# Patient Record
Sex: Male | Born: 1935 | Race: Black or African American | Hispanic: No | Marital: Married | Smoking: Former smoker
Health system: Southern US, Community
[De-identification: ages and names within clinical notes are randomized; demographics above are authoritative.]

## PROBLEM LIST (undated history)

## (undated) DIAGNOSIS — E119 Type 2 diabetes mellitus without complications: Secondary | ICD-10-CM

## (undated) DIAGNOSIS — S2249XA Multiple fractures of ribs, unspecified side, initial encounter for closed fracture: Secondary | ICD-10-CM

## (undated) DIAGNOSIS — E785 Hyperlipidemia, unspecified: Secondary | ICD-10-CM

## (undated) DIAGNOSIS — R0600 Dyspnea, unspecified: Secondary | ICD-10-CM

## (undated) DIAGNOSIS — Z794 Long term (current) use of insulin: Secondary | ICD-10-CM

## (undated) DIAGNOSIS — D649 Anemia, unspecified: Secondary | ICD-10-CM

## (undated) DIAGNOSIS — G8929 Other chronic pain: Secondary | ICD-10-CM

## (undated) DIAGNOSIS — I2510 Atherosclerotic heart disease of native coronary artery without angina pectoris: Secondary | ICD-10-CM

## (undated) DIAGNOSIS — T7840XA Allergy, unspecified, initial encounter: Secondary | ICD-10-CM

## (undated) DIAGNOSIS — Z9289 Personal history of other medical treatment: Secondary | ICD-10-CM

## (undated) DIAGNOSIS — N529 Male erectile dysfunction, unspecified: Secondary | ICD-10-CM

## (undated) DIAGNOSIS — I10 Essential (primary) hypertension: Secondary | ICD-10-CM

## (undated) DIAGNOSIS — M549 Dorsalgia, unspecified: Secondary | ICD-10-CM

## (undated) DIAGNOSIS — I472 Ventricular tachycardia, unspecified: Secondary | ICD-10-CM

## (undated) DIAGNOSIS — C61 Malignant neoplasm of prostate: Secondary | ICD-10-CM

## (undated) DIAGNOSIS — N189 Chronic kidney disease, unspecified: Secondary | ICD-10-CM

## (undated) HISTORY — PX: COLECTOMY: SHX59

## (undated) HISTORY — DX: Type 2 diabetes mellitus without complications: Z79.4

## (undated) HISTORY — DX: Other chronic pain: G89.29

## (undated) HISTORY — DX: Atherosclerotic heart disease of native coronary artery without angina pectoris: I25.10

## (undated) HISTORY — DX: Malignant neoplasm of prostate: C61

## (undated) HISTORY — DX: Ventricular tachycardia: I47.2

## (undated) HISTORY — DX: Type 2 diabetes mellitus without complications: E11.9

## (undated) HISTORY — DX: Hyperlipidemia, unspecified: E78.5

## (undated) HISTORY — DX: Dorsalgia, unspecified: M54.9

## (undated) HISTORY — DX: Ventricular tachycardia, unspecified: I47.20

## (undated) HISTORY — DX: Essential (primary) hypertension: I10

## (undated) HISTORY — DX: Male erectile dysfunction, unspecified: N52.9

## (undated) HISTORY — DX: Anemia, unspecified: D64.9

## (undated) HISTORY — DX: Allergy, unspecified, initial encounter: T78.40XA

---

## 2007-03-11 ENCOUNTER — Ambulatory Visit (HOSPITAL_COMMUNITY): Admission: RE | Admit: 2007-03-11 | Discharge: 2007-03-11 | Payer: Self-pay | Admitting: Urology

## 2007-04-08 ENCOUNTER — Ambulatory Visit (HOSPITAL_COMMUNITY): Admission: RE | Admit: 2007-04-08 | Discharge: 2007-04-08 | Payer: Self-pay | Admitting: Urology

## 2007-06-25 ENCOUNTER — Ambulatory Visit: Admission: RE | Admit: 2007-06-25 | Discharge: 2007-09-23 | Payer: Self-pay | Admitting: Radiation Oncology

## 2007-09-29 ENCOUNTER — Ambulatory Visit: Admission: RE | Admit: 2007-09-29 | Discharge: 2007-12-28 | Payer: Self-pay | Admitting: Radiation Oncology

## 2008-01-07 HISTORY — PX: PTCA: SHX146

## 2008-01-18 ENCOUNTER — Inpatient Hospital Stay (HOSPITAL_COMMUNITY): Admission: EM | Admit: 2008-01-18 | Discharge: 2008-01-22 | Payer: Self-pay | Admitting: Emergency Medicine

## 2008-01-18 ENCOUNTER — Ambulatory Visit: Payer: Self-pay | Admitting: Internal Medicine

## 2008-01-19 HISTORY — PX: CORONARY ANGIOPLASTY WITH STENT PLACEMENT: SHX49

## 2008-01-20 ENCOUNTER — Encounter (INDEPENDENT_AMBULATORY_CARE_PROVIDER_SITE_OTHER): Payer: Self-pay | Admitting: Cardiology

## 2008-01-22 ENCOUNTER — Ambulatory Visit: Payer: Self-pay | Admitting: Vascular Surgery

## 2008-01-22 ENCOUNTER — Encounter (INDEPENDENT_AMBULATORY_CARE_PROVIDER_SITE_OTHER): Payer: Self-pay | Admitting: *Deleted

## 2008-02-11 ENCOUNTER — Encounter (HOSPITAL_COMMUNITY): Admission: RE | Admit: 2008-02-11 | Discharge: 2008-04-06 | Payer: Self-pay | Admitting: Cardiology

## 2008-02-25 ENCOUNTER — Ambulatory Visit: Payer: Self-pay | Admitting: Internal Medicine

## 2008-02-25 LAB — CONVERTED CEMR LAB
Basophils Absolute: 0 10*3/uL (ref 0.0–0.1)
Basophils Relative: 0.1 % (ref 0.0–3.0)
Eosinophils Absolute: 0.1 10*3/uL (ref 0.0–0.7)
Eosinophils Relative: 1.3 % (ref 0.0–5.0)
Ferritin: 179 ng/mL (ref 22.0–322.0)
HCT: 33.9 % — ABNORMAL LOW (ref 39.0–52.0)
Hemoglobin: 11.5 g/dL — ABNORMAL LOW (ref 13.0–17.0)
Lymphocytes Relative: 9.8 % — ABNORMAL LOW (ref 12.0–46.0)
MCHC: 33.8 g/dL (ref 30.0–36.0)
MCV: 88.4 fL (ref 78.0–100.0)
Monocytes Absolute: 0.6 10*3/uL (ref 0.1–1.0)
Monocytes Relative: 8.7 % (ref 3.0–12.0)
Neutro Abs: 5.3 10*3/uL (ref 1.4–7.7)
Neutrophils Relative %: 80.1 % — ABNORMAL HIGH (ref 43.0–77.0)
Platelets: 202 10*3/uL (ref 150–400)
RBC: 3.83 M/uL — ABNORMAL LOW (ref 4.22–5.81)
RDW: 14.5 % (ref 11.5–14.6)
WBC: 6.7 10*3/uL (ref 4.5–10.5)

## 2008-03-27 ENCOUNTER — Inpatient Hospital Stay (HOSPITAL_COMMUNITY): Admission: EM | Admit: 2008-03-27 | Discharge: 2008-03-30 | Payer: Self-pay | Admitting: Emergency Medicine

## 2008-03-27 ENCOUNTER — Ambulatory Visit: Payer: Self-pay | Admitting: Internal Medicine

## 2008-04-08 ENCOUNTER — Encounter (HOSPITAL_COMMUNITY): Admission: RE | Admit: 2008-04-08 | Discharge: 2008-06-21 | Payer: Self-pay | Admitting: Cardiology

## 2008-04-28 ENCOUNTER — Ambulatory Visit: Payer: Self-pay | Admitting: Internal Medicine

## 2008-05-08 DIAGNOSIS — Z8679 Personal history of other diseases of the circulatory system: Secondary | ICD-10-CM | POA: Insufficient documentation

## 2008-05-08 DIAGNOSIS — I2510 Atherosclerotic heart disease of native coronary artery without angina pectoris: Secondary | ICD-10-CM

## 2008-05-08 DIAGNOSIS — I4949 Other premature depolarization: Secondary | ICD-10-CM

## 2008-05-08 DIAGNOSIS — I472 Ventricular tachycardia: Secondary | ICD-10-CM

## 2008-05-08 HISTORY — DX: Atherosclerotic heart disease of native coronary artery without angina pectoris: I25.10

## 2008-05-08 HISTORY — DX: Other premature depolarization: I49.49

## 2008-05-09 ENCOUNTER — Ambulatory Visit: Payer: Self-pay | Admitting: Internal Medicine

## 2008-05-09 LAB — CONVERTED CEMR LAB
BUN: 16 mg/dL (ref 6–23)
Basophils Absolute: 0 10*3/uL (ref 0.0–0.1)
Basophils Relative: 0.3 % (ref 0.0–3.0)
CO2: 22 meq/L (ref 19–32)
Calcium: 9.6 mg/dL (ref 8.4–10.5)
Chloride: 108 meq/L (ref 96–112)
Creatinine, Ser: 1.1 mg/dL (ref 0.4–1.5)
Eosinophils Absolute: 0.1 10*3/uL (ref 0.0–0.7)
Eosinophils Relative: 1.1 % (ref 0.0–5.0)
GFR calc Af Amer: 85 mL/min
GFR calc non Af Amer: 70 mL/min
Glucose, Bld: 144 mg/dL — ABNORMAL HIGH (ref 70–99)
HCT: 36 % — ABNORMAL LOW (ref 39.0–52.0)
Hemoglobin: 12.3 g/dL — ABNORMAL LOW (ref 13.0–17.0)
INR: 0.9 (ref 0.8–1.0)
Lymphocytes Relative: 19.3 % (ref 12.0–46.0)
MCHC: 34.2 g/dL (ref 30.0–36.0)
MCV: 84.8 fL (ref 78.0–100.0)
Monocytes Absolute: 0.6 10*3/uL (ref 0.1–1.0)
Monocytes Relative: 10.1 % (ref 3.0–12.0)
Neutro Abs: 3.8 10*3/uL (ref 1.4–7.7)
Neutrophils Relative %: 69.2 % (ref 43.0–77.0)
Platelets: 156 10*3/uL (ref 150–400)
Potassium: 4.9 meq/L (ref 3.5–5.1)
Prothrombin Time: 10.1 s — ABNORMAL LOW (ref 10.9–13.3)
RBC: 4.25 M/uL (ref 4.22–5.81)
RDW: 14.8 % — ABNORMAL HIGH (ref 11.5–14.6)
Sodium: 138 meq/L (ref 135–145)
WBC: 5.6 10*3/uL (ref 4.5–10.5)
aPTT: 27.5 s (ref 21.7–29.8)

## 2008-05-13 ENCOUNTER — Ambulatory Visit: Payer: Self-pay | Admitting: Internal Medicine

## 2008-05-13 ENCOUNTER — Ambulatory Visit (HOSPITAL_COMMUNITY): Admission: RE | Admit: 2008-05-13 | Discharge: 2008-05-13 | Payer: Self-pay | Admitting: Internal Medicine

## 2008-05-30 ENCOUNTER — Ambulatory Visit: Payer: Self-pay | Admitting: Internal Medicine

## 2008-05-30 ENCOUNTER — Inpatient Hospital Stay (HOSPITAL_COMMUNITY): Admission: EM | Admit: 2008-05-30 | Discharge: 2008-06-03 | Payer: Self-pay | Admitting: Emergency Medicine

## 2008-05-31 HISTORY — PX: CARDIAC CATHETERIZATION: SHX172

## 2008-06-27 ENCOUNTER — Encounter: Payer: Self-pay | Admitting: Internal Medicine

## 2008-06-27 ENCOUNTER — Ambulatory Visit: Payer: Self-pay | Admitting: Internal Medicine

## 2008-08-04 ENCOUNTER — Encounter (HOSPITAL_COMMUNITY): Admission: RE | Admit: 2008-08-04 | Discharge: 2008-11-02 | Payer: Self-pay | Admitting: Cardiology

## 2008-11-03 ENCOUNTER — Encounter (HOSPITAL_COMMUNITY): Admission: RE | Admit: 2008-11-03 | Discharge: 2008-12-15 | Payer: Self-pay | Admitting: Cardiology

## 2009-04-08 LAB — HM COLONOSCOPY

## 2009-07-13 ENCOUNTER — Inpatient Hospital Stay (HOSPITAL_COMMUNITY): Admission: EM | Admit: 2009-07-13 | Discharge: 2009-07-14 | Payer: Self-pay | Admitting: Emergency Medicine

## 2010-03-21 ENCOUNTER — Emergency Department (HOSPITAL_COMMUNITY)
Admission: EM | Admit: 2010-03-21 | Discharge: 2010-03-21 | Payer: Self-pay | Source: Home / Self Care | Admitting: Emergency Medicine

## 2010-06-27 LAB — COMPREHENSIVE METABOLIC PANEL
ALT: 22 U/L (ref 0–53)
AST: 22 U/L (ref 0–37)
Albumin: 3.4 g/dL — ABNORMAL LOW (ref 3.5–5.2)
Alkaline Phosphatase: 41 U/L (ref 39–117)
BUN: 12 mg/dL (ref 6–23)
CO2: 26 mEq/L (ref 19–32)
Calcium: 9.2 mg/dL (ref 8.4–10.5)
Chloride: 105 mEq/L (ref 96–112)
Creatinine, Ser: 0.92 mg/dL (ref 0.4–1.5)
GFR calc Af Amer: >60 mL/min (ref >60)
GFR calc non Af Amer: >60 mL/min (ref >60)
Glucose, Bld: 107 mg/dL — ABNORMAL HIGH (ref 70–99)
Potassium: 4.3 mEq/L (ref 3.5–5.1)
Sodium: 139 mEq/L (ref 135–145)
Total Bilirubin: 0.5 mg/dL (ref 0.3–1.2)
Total Protein: 6.3 g/dL (ref 6.0–8.3)

## 2010-06-27 LAB — TROPONIN I: Troponin I: 0.01 ng/mL (ref 0.00–0.06)

## 2010-06-27 LAB — DIFFERENTIAL
Basophils Absolute: 0 10*3/uL (ref 0.0–0.1)
Basophils Relative: 0 % (ref 0–1)
Eosinophils Absolute: 0.3 10*3/uL (ref 0.0–0.7)
Eosinophils Relative: 5 % (ref 0–5)
Lymphocytes Relative: 24 % (ref 12–46)
Lymphs Abs: 1.2 10*3/uL (ref 0.7–4.0)
Monocytes Absolute: 0.6 10*3/uL (ref 0.1–1.0)
Monocytes Relative: 13 % — ABNORMAL HIGH (ref 3–12)
Neutro Abs: 2.9 10*3/uL (ref 1.7–7.7)
Neutrophils Relative %: 58 % (ref 43–77)

## 2010-06-27 LAB — MAGNESIUM: Magnesium: 1.7 mg/dL (ref 1.5–2.5)

## 2010-06-27 LAB — GLUCOSE, CAPILLARY
Glucose-Capillary: 112 mg/dL — ABNORMAL HIGH (ref 70–99)
Glucose-Capillary: 123 mg/dL — ABNORMAL HIGH (ref 70–99)
Glucose-Capillary: 138 mg/dL — ABNORMAL HIGH (ref 70–99)

## 2010-06-27 LAB — CBC
HCT: 34.9 % — ABNORMAL LOW (ref 39.0–52.0)
Hemoglobin: 11.3 g/dL — ABNORMAL LOW (ref 13.0–17.0)
MCHC: 32.4 g/dL (ref 30.0–36.0)
MCV: 84.5 fL (ref 78.0–100.0)
Platelets: 177 10*3/uL (ref 150–400)
RBC: 4.13 MIL/uL — ABNORMAL LOW (ref 4.22–5.81)
RDW: 15.5 % (ref 11.5–15.5)
WBC: 5.1 10*3/uL (ref 4.0–10.5)

## 2010-06-27 LAB — LIPID PANEL
Cholesterol: 128 mg/dL (ref 0–200)
HDL: 48 mg/dL (ref >39)
LDL Cholesterol: 60 mg/dL (ref 0–99)
Total CHOL/HDL Ratio: 2.7 RATIO
Triglycerides: 102 mg/dL (ref <150)
VLDL: 20 mg/dL (ref 0–40)

## 2010-06-27 LAB — CARDIAC PANEL(CRET KIN+CKTOT+MB+TROPI)
CK, MB: 1.2 ng/mL (ref 0.3–4.0)
CK, MB: 2.4 ng/mL (ref 0.3–4.0)
Relative Index: 1 (ref 0.0–2.5)
Relative Index: INVALID (ref 0.0–2.5)
Total CK: 233 U/L — ABNORMAL HIGH (ref 7–232)
Total CK: 68 U/L (ref 7–232)
Troponin I: 0.01 ng/mL (ref 0.00–0.06)
Troponin I: <0.01 ng/mL (ref 0.00–0.06)

## 2010-06-27 LAB — CK TOTAL AND CKMB (NOT AT ARMC)
CK, MB: 1.2 ng/mL (ref 0.3–4.0)
Relative Index: INVALID (ref 0.0–2.5)
Total CK: 73 U/L (ref 7–232)

## 2010-06-27 LAB — TSH: TSH: 1.66 u[IU]/mL (ref 0.350–4.500)

## 2010-06-27 LAB — APTT: aPTT: 30 seconds (ref 24–37)

## 2010-06-27 LAB — PROTIME-INR
INR: 0.94 (ref 0.00–1.49)
Prothrombin Time: 12.5 seconds (ref 11.6–15.2)

## 2010-07-15 LAB — GLUCOSE, CAPILLARY: Glucose-Capillary: 164 mg/dL — ABNORMAL HIGH (ref 70–99)

## 2010-07-16 LAB — GLUCOSE, CAPILLARY
Glucose-Capillary: 174 mg/dL — ABNORMAL HIGH (ref 70–99)
Glucose-Capillary: 188 mg/dL — ABNORMAL HIGH (ref 70–99)
Glucose-Capillary: 156 mg/dL — ABNORMAL HIGH (ref 70–99)
Glucose-Capillary: 167 mg/dL — ABNORMAL HIGH (ref 70–99)
Glucose-Capillary: 227 mg/dL — ABNORMAL HIGH (ref 70–99)

## 2010-07-17 LAB — GLUCOSE, CAPILLARY
Glucose-Capillary: 213 mg/dL — ABNORMAL HIGH (ref 70–99)
Glucose-Capillary: 148 mg/dL — ABNORMAL HIGH (ref 70–99)
Glucose-Capillary: 153 mg/dL — ABNORMAL HIGH (ref 70–99)
Glucose-Capillary: 170 mg/dL — ABNORMAL HIGH (ref 70–99)
Glucose-Capillary: 172 mg/dL — ABNORMAL HIGH (ref 70–99)
Glucose-Capillary: 185 mg/dL — ABNORMAL HIGH (ref 70–99)
Glucose-Capillary: 201 mg/dL — ABNORMAL HIGH (ref 70–99)

## 2010-07-24 LAB — CARDIAC PANEL(CRET KIN+CKTOT+MB+TROPI)
CK, MB: 1.1 ng/mL (ref 0.3–4.0)
Relative Index: INVALID (ref 0.0–2.5)
Total CK: 73 U/L (ref 7–232)
Troponin I: <0.01 ng/mL (ref 0.00–0.06)

## 2010-07-24 LAB — DIFFERENTIAL
Basophils Absolute: 0 10*3/uL (ref 0.0–0.1)
Basophils Relative: 0 % (ref 0–1)
Eosinophils Absolute: 0.1 10*3/uL (ref 0.0–0.7)
Eosinophils Relative: 1 % (ref 0–5)
Lymphocytes Relative: 17 % (ref 12–46)
Lymphs Abs: 1 10*3/uL (ref 0.7–4.0)
Monocytes Absolute: 0.5 10*3/uL (ref 0.1–1.0)
Monocytes Relative: 9 % (ref 3–12)
Neutro Abs: 4.2 10*3/uL (ref 1.7–7.7)
Neutrophils Relative %: 72 % (ref 43–77)

## 2010-07-24 LAB — GLUCOSE, CAPILLARY
Glucose-Capillary: 122 mg/dL — ABNORMAL HIGH (ref 70–99)
Glucose-Capillary: 97 mg/dL (ref 70–99)
Glucose-Capillary: 109 mg/dL — ABNORMAL HIGH (ref 70–99)
Glucose-Capillary: 115 mg/dL — ABNORMAL HIGH (ref 70–99)
Glucose-Capillary: 116 mg/dL — ABNORMAL HIGH (ref 70–99)
Glucose-Capillary: 121 mg/dL — ABNORMAL HIGH (ref 70–99)
Glucose-Capillary: 124 mg/dL — ABNORMAL HIGH (ref 70–99)
Glucose-Capillary: 136 mg/dL — ABNORMAL HIGH (ref 70–99)
Glucose-Capillary: 139 mg/dL — ABNORMAL HIGH (ref 70–99)
Glucose-Capillary: 141 mg/dL — ABNORMAL HIGH (ref 70–99)
Glucose-Capillary: 144 mg/dL — ABNORMAL HIGH (ref 70–99)
Glucose-Capillary: 156 mg/dL — ABNORMAL HIGH (ref 70–99)
Glucose-Capillary: 160 mg/dL — ABNORMAL HIGH (ref 70–99)
Glucose-Capillary: 166 mg/dL — ABNORMAL HIGH (ref 70–99)
Glucose-Capillary: 167 mg/dL — ABNORMAL HIGH (ref 70–99)
Glucose-Capillary: 182 mg/dL — ABNORMAL HIGH (ref 70–99)
Glucose-Capillary: 183 mg/dL — ABNORMAL HIGH (ref 70–99)
Glucose-Capillary: 199 mg/dL — ABNORMAL HIGH (ref 70–99)
Glucose-Capillary: 83 mg/dL (ref 70–99)
Glucose-Capillary: 92 mg/dL (ref 70–99)

## 2010-07-24 LAB — BASIC METABOLIC PANEL
BUN: 12 mg/dL (ref 6–23)
BUN: 14 mg/dL (ref 6–23)
CO2: 20 mEq/L (ref 19–32)
CO2: 23 mEq/L (ref 19–32)
Calcium: 9.1 mg/dL (ref 8.4–10.5)
Calcium: 9.2 mg/dL (ref 8.4–10.5)
Chloride: 110 mEq/L (ref 96–112)
Chloride: 110 mEq/L (ref 96–112)
Creatinine, Ser: 0.98 mg/dL (ref 0.4–1.5)
Creatinine, Ser: 1.02 mg/dL (ref 0.4–1.5)
GFR calc Af Amer: >60 mL/min (ref >60)
GFR calc Af Amer: >60 mL/min (ref >60)
GFR calc non Af Amer: >60 mL/min (ref >60)
GFR calc non Af Amer: >60 mL/min (ref >60)
Glucose, Bld: 118 mg/dL — ABNORMAL HIGH (ref 70–99)
Glucose, Bld: 135 mg/dL — ABNORMAL HIGH (ref 70–99)
Potassium: 4.4 mEq/L (ref 3.5–5.1)
Potassium: 4.4 mEq/L (ref 3.5–5.1)
Sodium: 137 mEq/L (ref 135–145)
Sodium: 138 mEq/L (ref 135–145)

## 2010-07-24 LAB — COMPREHENSIVE METABOLIC PANEL
ALT: 13 U/L (ref 0–53)
AST: 22 U/L (ref 0–37)
Albumin: 3.3 g/dL — ABNORMAL LOW (ref 3.5–5.2)
Alkaline Phosphatase: 32 U/L — ABNORMAL LOW (ref 39–117)
BUN: 13 mg/dL (ref 6–23)
CO2: 17 mEq/L — ABNORMAL LOW (ref 19–32)
Calcium: 9.5 mg/dL (ref 8.4–10.5)
Chloride: 112 mEq/L (ref 96–112)
Creatinine, Ser: 1.04 mg/dL (ref 0.4–1.5)
GFR calc Af Amer: >60 mL/min (ref >60)
GFR calc non Af Amer: >60 mL/min (ref >60)
Glucose, Bld: 110 mg/dL — ABNORMAL HIGH (ref 70–99)
Potassium: 4.6 mEq/L (ref 3.5–5.1)
Sodium: 138 mEq/L (ref 135–145)
Total Bilirubin: 0.6 mg/dL (ref 0.3–1.2)
Total Protein: 6.3 g/dL (ref 6.0–8.3)

## 2010-07-24 LAB — CBC
HCT: 34.6 % — ABNORMAL LOW (ref 39.0–52.0)
HCT: 35.6 % — ABNORMAL LOW (ref 39.0–52.0)
HCT: 36.7 % — ABNORMAL LOW (ref 39.0–52.0)
Hemoglobin: 11.6 g/dL — ABNORMAL LOW (ref 13.0–17.0)
Hemoglobin: 12.1 g/dL — ABNORMAL LOW (ref 13.0–17.0)
Hemoglobin: 12.3 g/dL — ABNORMAL LOW (ref 13.0–17.0)
MCHC: 33.4 g/dL (ref 30.0–36.0)
MCHC: 33.5 g/dL (ref 30.0–36.0)
MCHC: 34 g/dL (ref 30.0–36.0)
MCV: 84.8 fL (ref 78.0–100.0)
MCV: 85.7 fL (ref 78.0–100.0)
MCV: 86.2 fL (ref 78.0–100.0)
Platelets: 164 10*3/uL (ref 150–400)
Platelets: 166 10*3/uL (ref 150–400)
Platelets: 170 10*3/uL (ref 150–400)
RBC: 4.02 MIL/uL — ABNORMAL LOW (ref 4.22–5.81)
RBC: 4.2 MIL/uL — ABNORMAL LOW (ref 4.22–5.81)
RBC: 4.28 MIL/uL (ref 4.22–5.81)
RDW: 15.8 % — ABNORMAL HIGH (ref 11.5–15.5)
RDW: 15.9 % — ABNORMAL HIGH (ref 11.5–15.5)
RDW: 16.4 % — ABNORMAL HIGH (ref 11.5–15.5)
WBC: 5.1 10*3/uL (ref 4.0–10.5)
WBC: 5.6 10*3/uL (ref 4.0–10.5)
WBC: 5.8 10*3/uL (ref 4.0–10.5)

## 2010-07-24 LAB — HEMOGLOBIN A1C
Hgb A1c MFr Bld: 7.1 % — ABNORMAL HIGH (ref 4.6–6.1)
Mean Plasma Glucose: 157 mg/dL

## 2010-07-24 LAB — TROPONIN I: Troponin I: <0.01 ng/mL (ref 0.00–0.06)

## 2010-07-24 LAB — MAGNESIUM: Magnesium: 2 mg/dL (ref 1.5–2.5)

## 2010-07-24 LAB — BRAIN NATRIURETIC PEPTIDE: Pro B Natriuretic peptide (BNP): 188 pg/mL — ABNORMAL HIGH (ref 0.0–100.0)

## 2010-07-24 LAB — CK TOTAL AND CKMB (NOT AT ARMC)
CK, MB: 1.4 ng/mL (ref 0.3–4.0)
Relative Index: INVALID (ref 0.0–2.5)
Total CK: 84 U/L (ref 7–232)

## 2010-07-24 LAB — APTT: aPTT: 31 seconds (ref 24–37)

## 2010-07-24 LAB — PROTIME-INR
INR: 1 (ref 0.00–1.49)
Prothrombin Time: 13.1 seconds (ref 11.6–15.2)

## 2010-08-21 NOTE — Discharge Summary (Signed)
[name], [name]               ACCOUNT NO.:  [id number]   MEDICAL RECORD NO.:  [id number]          PATIENT TYPE:  INP   LOCATION:  [id number]                         FACILITY:  [facility]   PHYSICIAN:  Tery Sanfilippo, MD     DATE OF BIRTH:  [birth year]   DATE OF ADMISSION:  05/30/2008  DATE OF DISCHARGE:  06/03/2008                               DISCHARGE SUMMARY   DISCHARGE DIAGNOSES:  1. Nonsustained ventricular tachycardia.  2. Status post comprehensive electrophysiology study with mapping and      ablation of his ventricular tachycardia.  3. History of normal left ventricular function.  4. Coronary artery disease, status post left cardiac catheterization      on May 31, 2008.  He had stable disease and he had a patent      left anterior descending stent.  Performed by Dr. Adrian Prows.  He      had minimal disease in right coronary artery and a circumflex.  5. Non-insulin-dependent diabetes mellitus.  6. Hypertension.  7. History of benign prostatic hypertrophy.  8. Prior history of some type of abdominal surgery when living in [place].   LABORATORY DATA:  His CK-MBs and troponins were all negative.  His  sodium was 138, potassium 4.4, glucose 135, BUN 14, and creatinine 1.02.  Hemoglobin A1c was 7.1, hemoglobin 11.6, hematocrit 34.6, WBCs 5.6, and  platelets 164.  Magnesium was 2.0 and BNP was 188.  Chest x-ray on  May 30, 2008, shows no acute cardiopulmonary abnormality.   HOSPITAL COURSE:  [name] is a 76 year old who came to the  emergency room by the request of Dr. Elisabeth Cara because of frequent NSVT on  his Julian monitoring that he had been wearing over the weekend.  He  has a prior medical history of coronary artery disease with a history of  a stent to his LAD vessel.  His last cath had been performed in October  2009, at which time he got a permanent stent in his LAD.  He also has  had a prior EP study on May 13, 2008.  However, he had low PVC  burden and they were unable to induce any VT.  He had previously been on  amiodarone and this had been stopped.  He was admitted for further  evaluation.  It was decided he should undergo cardiac catheterization on  May 31, 2008, which showed his LAD stent was patent.  He had  nonobstructive other coronary disease in his circumflex and LAD.  We  called an EP consult.  He was seen by Dr. Rayann Heman.  It was decided that  he should undergo repeat EP procedure.  This was performed on June 02, 2008, by Dr. Rayann Heman.  It was a comprehensive study.  He had mapping  and ablation of his VT.  Postprocedure, he did well.  He had no further  VT and it was decided that he should go home and wear his CardioNet to  see if the VT ablation was continued to be successful.  He will follow  up with  Dr. Elisabeth Cara and Dr. Rayann Heman as an outpatient.  He was given  appointments for both.   DISCHARGE MEDICATIONS:  The same medications he was on except he should  stop his verapamil.  Medicines include glipizide 5 mg 1/2 everyday,  lisinopril 40 mg a day, metformin 1000 mg a day, famotidine 20 mg a day,  metoprolol he was reduced back down to 50 mg a day which is both his  home dose that had been increased as an inpatient; however, he was not  able to take his nightly dose because of his heart rate and blood  pressure parameters,  megestrol 20 mg daily, Plavix 75 mg daily, Lipitor 20 mg a day, aspirin  325 mg everyday, Centrum Silver everyday, fish oil everyday, alprazolam  0.5 as needed p.r.n., calcium 2 daily, Niaspan 500 mg at bedtime, and  vitamin D 3000 units a day.      Cyndia Bent, N.P.      Tery Sanfilippo, MD  Electronically Signed    BB/MEDQ  D:  06/03/2008  T:  06/04/2008  Job:  [id number]   cc:   Thompson Grayer, MD  [name], M.D.

## 2010-08-21 NOTE — H&P (Signed)
NAMECOLTIN, [name]               ACCOUNT NO.:  [id number]   MEDICAL RECORD NO.:  [id number]          PATIENT TYPE:  INP   LOCATION:  4729                         FACILITY:  [facility]   PHYSICIAN:  Eden Lathe. Einar Gip, MD       DATE OF BIRTH:  06-02-1933   DATE OF ADMISSION:  03/27/2008  DATE OF DISCHARGE:  03/30/2008                              HISTORY & PHYSICAL   DISCHARGE DIAGNOSES:  1. Chest pain.  Negative CK-MBs and troponins doubt it to be unstable      angina.  2. Atherosclerotic cardiovascular disease with recent myocardial      infarction October 2009 with a PROMUS stent to his left anterior      descending.  3. Nonsustained ventricular tachycardia/[name] time      septal premature ventricular contractions seen by Dr. Rayann Heman as an      outpatient had previously been on amiodarone post myocardial      infarction.  This had been discontinued prior to this      hospitalization.  During this hospitalization, he had 26 beats of      nonsustained ventricular tachycardia several times with frequent      premature ventricular contractions.  His metoprolol was initially      increased.  He was then seen by Dr. Caryl Comes who also recommended      calcium channel blocker, thus his Norvasc was changed to verapamil.      He received one dose of that.  His premature ventricular      contractions that day and the next were diminished with no further      runs of nonsustained ventricular tachycardia.  His heart rate did      get down to the 50s thus his metoprolol was decreased back to 50 mg      b.i.d. at the time of discharge.  4. Non-insulin-dependent diabetes mellitus.  5. Hypertension.  6. Dyslipidemia.  7. History of pancreatitis.  8. History of mild anemia.  This was post procedure in October.  He      was on iron for 2 months, this has now been discontinued.  9. Prostate cancer with history of seed implantation now on injections      by Urology.  10.Normal left ventricular  function.   LABORATORY DATA:  Hemoglobin 12.3, hematocrit 36.8, WBC 5.0, and  platelets 189.  Magnesium was 2.1.  Sodium 137, potassium 4.9, glucose  192, BUN 8, creatinine 0.90.  Hemoglobin A1c was 6.6.  CK-MBs and  troponins were all negative.  TSH was 2.188.  Total cholesterol is 103,  triglycerides 103, HDL 25 and LDL 57.   DISCHARGE MEDICATIONS:  1. Plavix 75 mg one time per day.  2. Lisinopril 40 mg one time per day.  3. Metoprolol 50 mg twice a day.  4. Lipitor 20 mg once a day.  5. Glipizide 7.5 mg once a day.  6. Megestrol 20 mg daily.  7. Metformin 1000 mg b.i.d.  8. Pepcid 20 mg b.i.d.  9. Aspirin 325 mg everyday.  10.Centrum Silver one everyday.  11.Verapamil  SR 180 mg daily.  12.Fish oil one time a day.  13.Vitamin D 1000 mg a day.   He has a follow-up with Dr. Rayann Heman on May 03, 2007 at 9:45 and he  will return to see Dr. Elisabeth Cara in 2 weeks.   HOSPITAL COURSE:  [name] is a 77 year old African American male with  known coronary artery disease.  He had a PROMUS stent to his LAD October  2009 with an MI.  He came to the emergency room this admission with  complaints of chest pain after shoveling snow the day previously.  Chest  pain was on and off.  He went to Urgent Care and he was then transferred  to [facility].  He was seen by Dr. Quincy Acosta and thought to  be admitted.  He was placed on IV heparin.  The following day, he was  seen by Dr. Einar Gip.  His CK-MB and troponins were all negative.  It was  told that he had unstable angina with recent stenting with a DES stent  thus because of his frequent NSVT, it was decided to call EP Dr. Rayann Heman  for further evaluation.  Dr. Rayann Heman suggested increasing his metoprolol  to 100 mg b.i.d. and to follow up as an outpatient.  The patient  continued to have runs of NSVT thus he was kept in the hospital another  day.  He was seen by Dr. Caryl Comes in Dr. Jackalyn Lombard absence on January 28, 2008.  It was decided to  start him also on a calcium channel blocker  thus his Norvasc was discontinued and he was placed on verapamil.  After  the verapamil was given, he had his frequent PVCs diminished and he had  no further runs of NSVT.  He made an appointment to see Dr. Rayann Heman as an  outpatient prior to discharge by Dr. Caryl Comes.  He was kept in the hospital  overnight after beginning his verapamil.  His heart rate was noted to  drop down one time at 40 while he was sleeping but his baseline blood  pressure during the day dropped down to low 50s.  Thus the morning of  discharge, it was decided to decrease his metoprolol to 50 mg b.i.d.  from 100 mg b.i.d.  He was seen by Dr. Einar Gip prior to discharge and  considered stable for discharge home.      Cyndia Bent, N.P.      Eden Lathe. Einar Gip, MD  Electronically Signed    BB/MEDQ  D:  03/30/2008  T:  03/30/2008  Job:  [id number]   cc:   Thompson Grayer, MD

## 2010-08-21 NOTE — Op Note (Signed)
[name], [name]               ACCOUNT NO.:  [id number]   MEDICAL RECORD NO.:  [id number]          PATIENT TYPE:  OIB   LOCATION:  2899                         FACILITY:  [facility]   PHYSICIAN:  Thompson Grayer, MD       DATE OF BIRTH:  1933/10/02   DATE OF PROCEDURE:  DATE OF DISCHARGE:  05/13/2008                               OPERATIVE REPORT   PREPROCEDURE DIAGNOSES:  Nonsustained ventricular tachycardia and  frequent premature ventricular contractions.   POSTPROCEDURE DIAGNOSES:  Nonsustained ventricular tachycardia and  frequent premature ventricular contractions.   INTRODUCTION:  [name] is a pleasant 77 year old gentleman with a  history of coronary artery disease and symptomatic nonsustained  ventricular tachycardia.  He has had recent hospitalizations for  symptomatic premature ventricular contractions as well as nonsustained  ventricular tachycardia.  He notes that during cardiac rehab, his  tachycardia becomes incessant with symptoms of fatigue and shortness of  breath.  He has failed medical therapy with amiodarone, metoprolol and  verapamil.  He therefore presents today for EP study and radiofrequency  ablation.   DESCRIPTION OF THE PROCEDURE:  Informed written consent was obtained and  the patient was brought to the Electrophysiology Lab in a fasting state.  IV sedation was not performed as the patient was noted to have only rare  premature ventricular contractions upon presentation.  His PVC  morphology was of a left bundle-branch right inferior axis with a QRS  duration of 161 milliseconds.  He had two PVC couplets of this  morphology over approximately 10 minutes but no other PVCs were  spontaneously observed.  Isoproterenol was therefore infused at doses of  0.5, 1, 2, 3, and 4 mcg per minute with only very rare PVCs observed.  A  second PVC morphology was observed during 4 mcg per minute of  isoproterenol infusion which was of a left bundle-branch left  superior  axis with a QRS duration of 170 milliseconds and a QRS transition at V3,  which was felt to not represent his clinical PVC.  This PVC was also too  infrequent for adequate three-dimensional mapping.  Isoproterenol was  allowed to wash out and the patient was observed to have no PVCs during  isoproterenol washout.  The observation period during the EP procedure  was 2 hours.  Because of the very infrequent nature of the patient's  premature ventricular contractions today and inability to stimulate them  by voiding sedation and also by isoproterenol infusion, the patient's  groin was not accessed today and attempts at EP study and radiofrequency  ablation were not performed.  The patient tolerated the procedure well  and there were no early apparent complications.   CONCLUSIONS:  1. Sinus rhythm upon presentation.  2. Very rare premature ventricular contractions of a left bundle-      branch right inferior axis morphology and a QRS duration of 160      milliseconds with a QRS transition at V3, which was felt to      represent the patient's clinical tachycardia.  The PVC focus was      too infrequent  to be three-dimensionally mapped and therefore EP      study and radiofrequency ablation was not performed today.  3. No inducible clinical arrhythmias with isoproterenol infused at      0.5, 1, 2, 3 and 4 mcg per minute with adequate heart rate response      during isoproterenol infusion.  4. No early apparent complications.      Thompson Grayer, MD  Electronically Signed     JA/MEDQ  D:  05/13/2008  T:  05/13/2008  Job:  [id number]   cc:   Tery Sanfilippo, MD  [name], M.D.

## 2010-08-21 NOTE — H&P (Signed)
[name], [name]               ACCOUNT NO.:  [id number]   MEDICAL RECORD NO.:  [id number]          PATIENT TYPE:  INP   LOCATION:  [id number]                         FACILITY:  [facility]   PHYSICIAN:  Tery Sanfilippo, MD     DATE OF BIRTH:  [birth year]   DATE OF ADMISSION:  05/30/2008  DATE OF DISCHARGE:                              HISTORY & PHYSICAL   HPI:  Mr. Ugalde is a 77 year old male followed by Dr. Elisabeth Cara.  He has  a history of coronary disease.  He underwent LAD Promus stenting October  of 2009 in the setting of an MI.  Hospital course was complicated by  nonsustained V-tach.  He was put on amiodarone.  He did have normal LV  function.  He was seen in consult by Dr. Rayann Heman on January 21, 2008, for  nonsustained V-tach.  Patient was seen in followup also by Dr. Rayann Heman in  November and he was doing well and his amiodarone was discontinued.  He  was admitted again in December 2009 with some dyspnea and chest pressure  described as an elephant on my chest while shoveling snow.  His  enzymes were negative.  His calcium blocker was changed to verapamil and  his beta-blocker was titrated.  He had no further ectopy and he was  discharged.  He continues to have palpitations and was seen by Dr.  Rayann Heman again in January and set up for an EP study.  This was attempted  on May 13, 2008, but apparently there were only rare PVCs and no  inducible clinical arrhythmia.  Patient recently had a monitor as an  outpatient.  This reportedly showed runs of nonsustained V-tach.  He was  sent over to the emergency room to be admitted from the office by phone.  The patient does admit to me that he has exertional shortness of breath.  He says he does not have chest pain but he does admit to occasional  pressure.  Denies any associated nausea, vomiting, or diaphoresis.  He  has not had syncope.   PAST MEDICAL HISTORY:  Remarkable for:  1. Type 2 non-insulin-dependent diabetes.  2. He has treated  hypertension.  3. He has a history of BPH.  4. He has had some previous abdominal surgery when he was living in      [place] but he cannot remember the name of this.   CURRENT MEDICATIONS:  1. Lisinopril 40 mg a day.  2. Metoprolol 25 mg b.i.d.  3. Calan 360 mg a day.  4. Glyburide 7.5 mg a day.  5. Hydrochlorothiazide 25 mg a day.  6. Megace 20 mg a day.  7. Metformin 1 g b.i.d.   HE HAS NO KNOWN DRUG ALLERGIES.   SOCIAL HISTORY:  He is separated.  He has 7 children.  He quit smoking  more than 20 years ago.  He is retired.  He moved here from [facility] 7  years ago.   FAMILY HISTORY:  Unremarkable for early coronary disease.   REVIEW OF SYSTEMS:  Essentially unremarkable except for noted above.  He  does have palpitations,  fluttering, and dyspnea on exertion.   LABS:  Sodium 138, potassium 4.6, BUN 13, creatinine 1.04, white count  5.8, hemoglobin 12.3, hematocrit 36.7, platelets 170, INR 1.0, magnesium  2.0.  BNP 188.  CK and troponin are negative in the emergency room.  Chest x-ray shows no active disease.  EKG shows sinus rhythm with PVCs.   IMPRESSION:  1. Premature ventricular contractions and nonsustained ventricular      tachycardia, recurrent.  2. Exertional dyspnea, rule out progression of coronary disease.  3. Known coronary disease with unstable angina, October of 2009,      treated with left anterior descending Promus stenting.  The patient      has been on Plavix although this was not listed in the emergency      room as one of his medications.  4. Type 2 non-insulin-dependent diabetes.  5. Normal left ventricular function.  6. Treated hypertension.   PLAN:  Patient will be admitted to telemetry.  We will discuss with MD  but he will probably need restudy.      Erlene Quan, P.A.      Tery Sanfilippo, MD  Electronically Signed    LKK/MEDQ  D:  05/30/2008  T:  05/30/2008  Job:  [id number]

## 2010-08-21 NOTE — Op Note (Signed)
[name], [name]               ACCOUNT NO.:  [id number]   MEDICAL RECORD NO.:  [id number]          PATIENT TYPE:  INP   LOCATION:  [id number]                         FACILITY:  [facility]   PHYSICIAN:  Thompson Grayer, MD       DATE OF BIRTH:  [birth year]   DATE OF PROCEDURE:  06/02/2008  DATE OF DISCHARGE:                               OPERATIVE REPORT   SURGEON:  Thompson Grayer, MD   PREPROCEDURE DIAGNOSIS:  Ventricular tachycardia.   POSTPROCEDURE DIAGNOSES:  Right ventricular outflow tract ventricular  tachycardia.   PROCEDURES:  1. Comprehensive electrophysiology study.  2. Coronary sinus pacing and recording.  3. Mapping of ventricular tachycardia.  4. Ablation of ventricular tachycardia.  5. Isoproterenol infusion.   INTRODUCTION:  [name] is a pleasant 77 year old gentleman with a  history of coronary artery disease status post prior stenting of his LAD  in October 2009 with longstanding premature ventricular contractions and  nonsustained ventricular tachycardia.  He has been treated medically  with metoprolol and verapamil without success.  He was found to have  exertional PVCs which are also worsened by vagal maneuvers.  He reports  associated symptoms of fatigue and occasional dizziness.  He has had no  syncope or presyncope and otherwise appears to be tolerating  nonsustained ventricular tachycardia rather well.  He now presents for  EP study and radiofrequency ablation.   DESCRIPTION OF PROCEDURE:  Informed written consent was obtained and the  patient was brought to the electrophysiology lab in a fasting state.  He  was not sedated as to maintain an adequate PVC burden during the  procedure.  The patient's right groin was prepped and draped in the  usual sterile fashion by the EP lab staff.  Using a percutaneous  Seldinger technique, two 6-French and one 8-French hemostasis sheaths  were placed in the right common femoral vein.  A 6-French decapolar  Polaris X catheter  was introduced through the right common femoral vein  and advanced into the coronary sinus for recording and pacing from this  location.  A 6-French quadripolar Josephson catheter was introduced  through the right common femoral vein and advanced into the right  ventricle for recording and pacing.  The catheter was then pulled back  to the His bundle location.  The patient presented to the  electrophysiology lab in normal sinus rhythm.  His AH interval measured  71 milliseconds with an HV interval of 38 milliseconds.  The patient was  noted to have frequent premature ventricular contractions often  occurring in a bigeminal and trigeminal pattern with couplets.  This  premature ventricular contraction and the nonsustained ventricular  tachycardia were of a single morphology which was a left bundle-branch  right inferior axis with a QRS duration of 160 milliseconds.  The  patient was noted to have a significant increase in his PVC burden with  vagal maneuvers, including cough and increasing intra-abdominal  pressure.  Ventricular pacing was performed which significantly  increased the PVC burden during pacing.  Ventricular pacing, however,  did revealed midline decremental VA conduction with a VA Wenckebach  cycle length of 310 milliseconds.  Ventricular extrastimulus testing was  performed from the base of the right ventricle which revealed midline  decremental VA conduction with frequent PVCs during pacing.  The  ventricular ERP was 450/200 milliseconds.  Programmed extrastimulus  testing was performed from the base of the right ventricle with a basic  cycle length of 450 milliseconds with S1, S2, S3, S4 extrastimuli down  to refractoriness (450/250/240/200 milliseconds) with no inducible  ventricular tachycardia or ventricular fibrillation.  The pacing  catheter was then repositioned at the right ventricular apex and  programmed extrastimulus testing was again performed with a basic  cycle  length at this time of 500 milliseconds with S1, S2, S3, S4 extrastimuli  down to refractoriness (500/240/230/200 milliseconds) with no inducible  ventricular tachycardia or ventricular fibrillation.  Rapid atrial  pacing was performed from the distal coronary sinus, which did  significantly increased the PVC burden during pacing.  The AV Wenckebach  cycle length was 340 milliseconds with no tachycardias induced.  There  was no evidence of PR greater than RR.  Atrial extrastimulus testing was  performed which revealed decremental AV conduction with an atrial ERP of  500/240 milliseconds with no AH jumps, echo beats, or tachycardia  induced.  I then elected to map the dominant PVC focus.  A 7-French  [employer] II 4-mm ablation catheter was introduced  through the right common femoral vein and advanced into the right  ventricle.  Mapping was performed throughout the right ventricle and the  earliest electrograms were recorded from the right ventricular outflow  tract.  Extensive ablation was therefore performed within the right  ventricle.   Dictation ended at this point.      Thompson Grayer, MD     JA/MEDQ  D:  06/02/2008  T:  06/03/2008  Job:  [id number]

## 2010-08-21 NOTE — Consult Note (Signed)
[name], [name]               ACCOUNT NO.:  [id number]   MEDICAL RECORD NO.:  [id number]          PATIENT TYPE:  INP   LOCATION:  2610                         FACILITY:  [facility]   PHYSICIAN:  Thompson Grayer, MD       DATE OF BIRTH:  Aug 04, 1933   DATE OF CONSULTATION:  05/31/2008  DATE OF DISCHARGE:                                 CONSULTATION   REQUESTING PHYSICIAN:  Tery Sanfilippo, MD   REASON FOR CONSULTATION:  Ventricular tachycardia.   [name] is a pleasant 77 year old gentleman with a history of  coronary artery disease, preserved ejection fraction, and nonsustained  ventricular tachycardia.  He has previously been found to have a left  bundle-branch, right inferior axis, PVC morphology, which occurs  frequently and occasionally with nonsustained ventricular tachycardia.  He was previously hospitalized for this nonsustained ventricular  tachycardia and initiated on verapamil.  He had significant reduction in  his PVC burden with verapamil.  He subsequently was found to have  increasing nonsustained ventricular tachycardia during cardiac rehab.  I  therefore brought him back to the hospital on May 13, 2008, for  ablation of the VT focus.  The patient was observed, but found to have  no PVCs upon arrival.  Isoproterenol was infused at 0.5, 1, 2, 3, and 4  mcg per minute with only very rare PVCs.  The focus was therefore felt  not to be amenable to ablation at that time.  The patient was discharged  on his home medicine regimen.  He most recently had a CardioNet monitor  placed, which has documented increasing frequency of nonsustained  ventricular tachycardia.  Despite his nonsustained ventricular  tachycardia and frequent PVCs, the patient is relatively asymptomatic.  He does report occasional fatigue and shortness of breath with moderate  activities.  He denies chest pain, palpitations, presyncope, or syncope.  During his hospital stay, he is observed to have  nonsustained  ventricular tachycardia 15 beats with symptoms of only mild dizziness at  that time.  He is otherwise without complaint presently.  He is unaware  of any triggers or precipitants for his PVC focus and medical therapy  with verapamil and metoprolol has been only moderately successful.  He  was previously treated with amiodarone, which did appear to suppress the  PVC focus; however, this was discontinued due to long-term concerns by  using this medication.   PAST MEDICAL HISTORY:  1. Coronary artery disease.  2. Nonsustained ventricular tachycardia and frequent premature      ventricular contractions.  3. Preserved ejection fraction.  4. Diabetes mellitus.  5. Hyperlipidemia.  6. History of anemia.  7. History of prostate cancer.   Transthoracic echocardiogram on January 20, 2008, reveals a left  ventricular end-diastolic dimension of 41, IVS 12, posterior wall 12,  left ventricular ejection fraction normal with no wall motion  abnormalities.  Mild-to-moderate mitral annular calcification was  observed.  The right ventricle was mildly dilated.   Left heart catheterization on January 19, 2008, reveals a mid LAD  stenosis of 70-90%, which was successfully stented with a Promus stent.  A 40% ostial LAD disease was also observed.   ALLERGIES:  No known drug allergies.   HOME MEDICATIONS:  1. Glipizide 2.5 mg daily.  2. Lisinopril 40 mg daily.  3. Metformin 1000 mg daily.  4. Famotidine 20 mg daily.  5. Metoprolol tartrate 50 mg daily.  6. Verapamil 360 mg daily.  7. Megace 20 mg daily.  8. Plavix 75 mg daily.  9. Lipitor 20 mg daily.  10.Aspirin 325 mg daily.  11.Centrum Silver daily.  12.Fish oil daily.   SOCIAL HISTORY:  The patient lives in [place].  He has a history of  tobacco, but quit 22 years ago.  He denies alcohol or drug use.   FAMILY HISTORY:  Notable for coronary artery disease.   REVIEW OF SYSTEMS:  All systems are reviewed and negative  except as  outlined in the HPI above.   PHYSICAL EXAMINATION:  VITAL SIGNS:  Blood pressure 134/70, heart rate  70, respirations 18, sats 98% on room air, and afebrile.  GENERAL:  The patient is a well-appearing male, in no acute distress.  He is alert and oriented x3.  HEENT:  Normocephalic and atraumatic.  Sclerae clear.  Conjunctivae  pink.  Oropharynx clear.  NECK:  Supple.  No thyromegaly, JVD, or bruits.  LUNGS:  Clear to auscultation bilaterally.  HEART:  Regular rate and rhythm with frequent ectopy.  No murmurs, rubs,  or gallops.  GI:  Soft, nontender, and nondistended.  Positive bowel sounds.  EXTREMITIES:  No clubbing, cyanosis, or edema.  NEUROLOGIC:  Strength and sensation are intact.  SKIN:  No ecchymosis or laceration.  MUSCULOSKELETAL:  No deformity or atrophy.  PSYCH:  Euthymic mood.  Full affect.   LABORATORIES:  Troponin less than 0.01, CK 84, and CK-MB 1.4.  Magnesium  2, potassium 4.6, BUN 13, and creatinine 1.  BNP 188.  INR 1.  Hematocrit 37 and platelets 170.   EKG reveals sinus rhythm with frequent premature ventricular  contractions.  The QT interval measures 400 msec.  The PVC morphology is  of a left bundle-branch, right inferior axis.   Telemetry reveals a very frequent nonsustained ventricular tachycardia.   IMPRESSION:  [name] is a pleasant 77 year old gentleman with a  nonsustained ventricular tachycardia and premature ventricular  contractions of increasing frequency despite medical therapy with  verapamil and metoprolol.  He has a preserved ejection fraction with a  history of coronary artery disease.  He has had no presyncopal or  syncopal events.  His symptoms are mild lightheadedness and fatigue.   PLAN:  Therapeutic strategies for frequent premature ventricular  contractions were again addressed including both medicine and catheter-  based therapies.  I think that we should discontinue verapamil, which  does not appear to be effective  at this time.  We could consider  initiation of sotalol as an anti-arrhythmic to control his ventricular  tachycardia.  I would avoid amiodarone due to the long-term effects of  this medication.  The patient has been scheduled for a left heart  catheterization today.  Pending the results of his catheterization, we  will then decide further treatment options.  I suspect that given his  increasing frequency of ventricular ectopy, he may be a candidate for  catheter ablation.  We could therefore consider EP study and  radiofrequency ablation to hopefully eliminate the VT focus.  If this is  unsuccessful or if the patient opts for a medical strategy, then I think  that our next option should  be treatment with an anti-arrhythmic such as  sotalol.      Thompson Grayer, MD  Electronically Signed     JA/MEDQ  D:  05/31/2008  T:  05/31/2008  Job:  [id number]

## 2010-08-21 NOTE — Cardiovascular Report (Signed)
NAMEALVI, STUTEVILLE               ACCOUNT NO.:  [id number]   MEDICAL RECORD NO.:  [id number]          PATIENT TYPE:  INP   LOCATION:  2610                         FACILITY:  [facility]   PHYSICIAN:  Eden Lathe. Einar Gip, MD       DATE OF BIRTH:  03/18/1934   DATE OF PROCEDURE:  05/31/2008  DATE OF DISCHARGE:                            CARDIAC CATHETERIZATION   PROCEDURE PERFORMED:  1. Left ventriculography.  2. Selective right and left coronary angiography.   INDICATIONS:  Mr. Curvin Haggan is a 77 year old gentleman with known  coronary artery disease.  He had undergone PTCA and stenting where he  presented on January 19, 2008, when he presented with chest discomfort.  He has undergone stent implantation with drug-eluting 2.75 x 28 mm  PROMUS stent at that time.  He was re-admitted to the [facility] on March 27, 2008, for atypical chest pain with nonsustained  ventricular tachycardia.  He underwent outpatient stress test, which  revealed no evidence of ischemia.  However, he was re-admitted back to  the hospital yesterday with recurrent chest discomfort, hence he is now  brought to the Cardiac Catheterization Lab for definitive delineation of  his coronary anatomy.   HEMODYNAMIC DATA:  The ventricular pressure was 75/none with end-  diastolic pressure of 13 milliseconds.  Aortic pressure was 75/47 with a  mean of 62 mmHg.  There was no significant pressure gradient across the  aortic valve.   ANGIOGRAPHIC DATA:  Left ventricle.  Left ventricular systolic function  was normal with ejection fraction of 55-60%.  There was no significant  mitral regurgitation.   Right coronary.  Right coronary artery is a large caliber vessel and  dominant vessel.  It has got mild diffuse luminal irregularity, proximal  20%, mid 30%, and the PDA branch has 50% stenosis.   Left main coronary.  Left main coronary artery is very short and  bifurcates immediately.   Circumflex artery.  Circumflex  artery is a moderate caliber vessel with  diffuse luminal irregularity.  These constitute 10-20% stenoses.  It  gives origin to small OM-1, OM-2, and a moderate-sized OM-3.   LAD.  LAD is a large caliber vessel in the proximal segment with diffuse  luminal irregularity of the mid-to-distal segment.  It ends at the apex.  It gives origin to several small diagonals.  The previously placed stent  in the mid segment of the LAD is widely patent without any evidence of  thrombus, burden, or ulcerations.   IMPRESSION:  Chaston Butchart is a 77 year old gentleman who has  nonsustained ventricular tachycardia and supraventricular tachycardia.  He needs evaluation by Electrophysiology for evaluation of his  arrhythmias.  As well as coronary artery disease concern, his lesions  appeared to be stable without any unstable plaque.   A total of 85 mL of contrast was utilized for diagnostic angiography.   His hypotension was related to sedation, which improved immediately  postprocedure.   TECHNIQUE OF THE PROCEDURE:  Under usual sterile precaution using a 6-  French left femoral arterial access, a 6-French multipurpose B2 catheter  was advanced into the ascending aorta and then into the left ventricle.  Left ventriculography was performed both in LAO and RAO projection.  Catheter pulled into both in LAO and RAO projection.  Catheter pulled  into the ascending aorta.  Right coronary artery was selectively engaged  and angiography was performed.  Then, left main coronary artery was  selectively engaged and angiography was performed.  Then, catheter  pulled out of the body.  The patient tolerated the procedure well.  No  immediate complications were noted.      Eden Lathe. Einar Gip, MD  Electronically Signed     JRG/MEDQ  D:  05/31/2008  T:  06/01/2008  Job:  [id number]   cc:   Dr. Cindee Lame

## 2010-08-21 NOTE — Discharge Summary (Signed)
NAMESAKETH, FERRAND               ACCOUNT NO.:  [id number]   MEDICAL RECORD NO.:  [id number]          PATIENT TYPE:  INP   LOCATION:  2904                         FACILITY:  [facility]   PHYSICIAN:  Leslye Peer, MD       DATE OF BIRTH:  1933/05/06   DATE OF ADMISSION:  01/18/2008  DATE OF DISCHARGE:  01/22/2008                               DISCHARGE SUMMARY   DISCHARGE DIAGNOSES:  1. Unstable angina, negative myocardial infarction.  2. Coronary artery disease with 80% mid left anterior descending      stenosis undergoing 80-90% mid left anterior descending stenosis,      undergoing percutaneous transluminal coronary angioplasty and stent      deployment with a Promus drug-eluting stent on January 19, 2008.  3. Nonsustained ventricular tachycardia requiring intravenous      amiodarone and now p.o.  The patient had presyncope and      palpitations.  4. Normal left ventricular function.  5. Dehydration on admission.  6. Diabetes mellitus type 2.  7. Hypertension, controlled.  8. Hyperlipidemia.  9. Recent history of pancreatitis.  10.Prostate cancer with a recent history of seed implant.  11.Cellulitis from intravenous site in left arm.  12.Mild anemia secondary to procedures blood loss anemia.   DISCHARGE MEDICATIONS:  1. Lisinopril 40 mg daily.  2. Metoprolol tartrate 50 mg 1/2 tablet twice a day and 25 mg twice a      day.  3. Simvastatin 40 mg daily.  4. Glipizide 5 mg 1-1/2 tablets to equal 7.5 mg daily.  5. Amlodipine 10 mg daily.  6. Megestrol 20 mg daily.  7. Imodium as needed.  8. Metformin 1000 mg twice a day.  He can continue that beginning      today on January 22, 2008.  9. Enteric-coated aspirin 325 mg daily.  10.Plavix 75 mg one daily.  Do not stop, stopping could cause a heart      attack.  11.Amiodarone 200 mg one twice a day.  12.Nitroglycerin 1/150 one under tongue as needed for chest pain, one      every 5 minutes while sitting, and over 15 minutes.  If no  better,      call 911.  13.Keflex 500 mg four times a day for 10 days.  14.Nu-Iron 150 mg once daily.  15.Pepcid 20 mg twice daily.  Please stop hydrochlorothiazide.   DISCHARGE INSTRUCTIONS:  1. Low-sodium, heart-healthy diabetic diet.  2. Increase activity slowly.  May shower and bathe with 2 days post      procedure, so continue previous activities only increase slowly.  3. Wash cath site with soap and water.  Call us if any bleeding,      swelling, or drainage.  4. See Dr. Rayann Heman at [facility] for EP and V-tach on Thursday      February 19, 2008, at 10:00 a.m.  5. Follow up with Dr. Elisabeth Cara at [facility] on February 04, 2008, at      11:45 a.m.  6. Follow up with Dr. Nyoka Cowden, primary care physician as before.  7. Follow up  with Dr. Lisbeth Renshaw for urology as before.   Warm compresses to left arm may be done 3-4 times a day as well.   HISTORY OF PRESENT ILLNESS:  A 77 year old male patient presented to the  Emergency Room at [facility] on January 18, 2008, secondary to progressive  chest pain, heaviness associated with shortness of breath, palpitations,  dizziness, and presyncope.  He does have PND, but no edema, no  orthopnea.   The patient was admitted to rule out MI by Dr. Mathis Bud and was started  on heparin and nitroglycerin.  We held his metformin in anticipation of  cardiac catheterization and also his hydrochlorothiazide and he was  placed on sliding scale insulin for his diabetes as well.   Past medical history is positive for hypertension and diabetes and has  been followed by Dr. Elisabeth Cara a year previous to this ER visit with  history of pancreatitis and dehydration.  Previous nuclear study in  August 2008 was negative, considered low risk and 2-D echo was  essentially unremarkable with normal LV function at that time.   Since that time, he had been diagnosed with prostate cancer and this  seen by Dr. Lisbeth Renshaw in [place], [place].  He will follow up with him  as an  outpatient as well.  The patient also has history of  hyperlipidemia and now on statin.   FAMILY HISTORY:  See H and P.   SOCIAL HISTORY:  See H and P.   REVIEW OF SYSTEMS:  See H and P.   PHYSICAL EXAMINATION ON DISCHARGE:  VITAL SIGNS:  Blood pressure 106/63,  pulse 67, temperature 99.4, and sats 98% on room air.  HEART:  Regular rate and rhythm.  LUNGS:  Clear bilaterally.  ABDOMEN:  Soft and nontender.  EXTREMITIES:  No edema.  Right groin cath site without hematoma and no  lower extremity edema.  NEUROLOGIC:  Alert and oriented x3.   LABORATORY DATA:  Hemoglobin on admission 1.4, hematocrit 34.6, WBC 6.8,  platelets 184.  At discharge, hemoglobin had drifted down 10.3,  hematocrit 31 units, WBC 6, platelets 152, MCV 90.6, neutrophils 74.   Chemistry; sodium 136, potassium 3.8, chloride 108, CO2 21, BUN 12,  creatinine 0.91, glucose 125.  Coags on admission, protime 13.3, INR of  1, PTT 82.  On heparin, he was therapeutic.   LFTs; AST was normal 15, ALT 15, alkaline phos 30, total bili 0.7,  albumin 3.  Heart:  Cardiac enzymes were all negative ranging 58-44 with  MBs of 1.0-0.8, troponin I of 0.01-0.03, cholesterol 110, LDL 55, HDL  29, and triglycerides 131.   Magnesium 1.9, calcium 9.1, and TSH was still pending at discharge.   As well as chest x-ray, 2-D echo was found to have normal LV function.  See dictated report.  EKG; sinus rhythm.  No acute ST changes, but two  with burst of nonsustained ventricular tachycardia.   HOSPITAL COURSE:  The patient was admitted by Dr. Mathis Bud on call for  Dr. Elisabeth Cara.  For the patient, he presented with unstable angina,  shortness of breath, presyncope and palpitations, also having  nonsustained ventricular tachycardia.  He was admitted, placed on  heparin and nitroglycerin, underwent cardiac catheterization on January 19, 2008 and found to have 70-90% mid LAD stenosis as well as residual  stenosis of ostial LAD 40%, OM1 40%.   He then underwent PTCA and stent  deployment by Dr. [name] with Promus stent.  Aspirin and Plavix were  added to  his medical regimen.  The patient continued with episodes of  nonsustained ventricular tachycardia.  He was put on IV amiodarone and  by January 20, 2008, this was switched to p.o. amiodarone.  On January 20, 2008, his cardiac enzymes at all been negative.  He was ambulated  with cardiac rehab and did quite well.  Vital signs were stable.  Accu-  Cheks were stable with normal EF and continued burst of nonsustained  ventricular tachycardia.  Dr. Elisabeth Cara talked with Dr. Rayann Heman with the EP  and EP consult was obtained.  On the day of discharge, he has much  improved ventricular tachycardia.  He will continue on amiodarone twice  a day for 4 weeks, then he will follow up with Dr. Rayann Heman February 25, 2008 as stated.   Please note prior to discharge, the patient's left arm appeared to have  cellulitis with arrhythmic IV Rocephin was given as well as a one dose  of Lovenox in case there was DVT, venous Dopplers will be done prior to  discharge.  If that is negative, he can go home with warm soaks or warm  pads to the left arm and were putting him on Keflex 4 times a day for 10  days.   Please note amiodarone 200 mg twice a day for 4 weeks, but we will see  Dr. Elisabeth Cara back and he will direct the patient at that point.  The  patient will have an outpatient monitor on as well.  He will pick his  monitor up on Monday the January 25, 2008 at 2:00 p.m. at Dr. Francine Graven  office.      Otilio Carpen. Dorene Ar, N.P.    ______________________________  Leslye Peer, MD    LRI/MEDQ  D:  01/22/2008  T:  01/23/2008  Job:  [id number]   cc:   Tery Sanfilippo, MD  [name], M.D.  Thompson Grayer, MD  Dr. Lisbeth Renshaw

## 2010-08-21 NOTE — Op Note (Signed)
NAMERAYLYN, ARONS               ACCOUNT NO.:  [id number]   MEDICAL RECORD NO.:  [id number]          PATIENT TYPE:  INP   LOCATION:  [id number]                         FACILITY:  [facility]   PHYSICIAN:  [facility] Grayer, MD       DATE OF BIRTH:  1934/01/14   DATE OF PROCEDURE:  DATE OF DISCHARGE:                               OPERATIVE REPORT   SURGEON:  [facility] Grayer, MD   PREPROCEDURE DIAGNOSIS:  Ventricular tachycardia.   POSTPROCEDURE DIAGNOSIS:  Right ventricular outflow tract ventricular  tachycardia.   PROCEDURES:  1. Comprehensive electrophysiology study.  2. Coronary sinus pacing and recording.  3. Mapping of ventricular tachycardia.  4. Ablation of ventricular tachycardia.  5. Isoproterenol infusion.   INTRODUCTION:  Mr. Rogan is a pleasant 77 year old gentleman with a  longstanding history of palpitations.  He has been documented to have  frequent premature ventricular contractions as well as nonsustained  ventricular tachycardia.  He has felt medical therapy with verapamil and  metoprolol and now presents for EP study and radiofrequency ablation.   DESCRIPTION OF PROCEDURE:  Informed written consent was obtained and the  patient was brought to the electrophysiology lab in a fasting state.  He  was not sedated as to maintain an adequate PVC burden for mapping.  He  was using a modified percutaneous Seldinger technique, two 6-French and  one 8-French hemostasis sheaths were placed in the right common femoral  vein.  A 6-French decapolar Polaris X catheter was introduced through  the right common femoral vein and advanced into the coronary sinus for  recording and pacing from this location.  A 6-French quadripolar  Josephson catheter was introduced in the right common femoral vein and  advanced into the His bundle location.  This catheter was also advanced  into the right ventricle for ventricular pacing.  The patient presented  to the electrophysiology lab in normal sinus  rhythm.  His AH interval  measured 71 milliseconds with an HV interval of 38 milliseconds.  The RR  interval measured 893 milliseconds, QT interval 388 milliseconds, and  QRS duration 100 milliseconds.  The patient was noted to have frequent  premature ventricular contractions and often a trigeminal and bigeminal  pattern with couplets and triplets observed.  There was a single PVC  morphology, which was a left bundle-branch right inferior axis  morphology with a QRS duration of 160 milliseconds.  The patient was  observed to have an increase in PVCs with vagal maneuvers such as cough  and increased abdominal pressure.  Pacing was performed from the base of  the right ventricle, which significantly increased the frequency of the  PVCs.  Ventricular pacing revealed midline VA conduction with a VA  Wenckebach cycle length of 310 milliseconds.  Programmed extra stimulus  testing was performed from the base of the right ventricle, which  revealed midline decremental VA conduction with frequent PVCs induced.  The ventricular ERP was 450/200 milliseconds.  Programmed extra stimulus  testing was performed from the base of the right ventricle with a basic  cycle length of 450 milliseconds down to S1-S2, S3-S4  refractoriness  (450/250/240/200 milliseconds) with no ventricular tachycardia or  ventricular fibrillation induced.  The catheter was then repositioned at  the right ventricular apex and programmed extra stimulus testing was  again performed with a basic cycle length of 500 milliseconds down to  S1, S2, S3, S4 refractoriness (500/240/230/200 milliseconds) with no  inducible ventricular tachycardia or ventricular fibrillation.  The  patient was, however, observed to have frequent PVCs during ventricular  pacing.  Pacing was then performed from the distal coronary sinus, which  revealed increasing PVCs with pacing.  There is no evidence of PR  greater than RR and no tachycardia induced.  The  AV Wenckebach cycle  length was 340 milliseconds.  Atrial extra stimulus testing was  performed, which revealed decremental AV conduction with no AH jumps or  echo beats.  The atrial ERP was 500/240 milliseconds.  Again, frequent  PVCs were observed during atrial pacing.  I therefore elected to map the  right ventricle.  A 7-French eBay II 4-mm ablation  catheter was introduced through the right common femoral vein and  advanced into the right ventricle.  Extensive mapping was performed  throughout the right ventricle.  The earliest activations were found to  arise from the right ventricular outflow tract.  I therefore extensively  mapped the right ventricular outflow tract and the earliest activation  proceeding each PVC was found to occur along the anteroseptal right  ventricular outflow tract below the pulmonic valve.  In this location,  the ventricular activation recorded from the distal ablation electrode  preceded the surface QRS by 32 milliseconds.  Pacing was performed in  this location, which revealed a 12 out of 12 pace map match from the  anteroseptal right ventricular outflow tract.  A single radiofrequency  application was delivered in this location with a target temperature of  60 degrees and 30 watts delivered for 60 seconds.  Initially, during the  radiofrequency application, a flurry of PVCs was observed.  The patient  then had no further PVCs.  Two additional radiofrequency applications  were delivered with a target temperature of 60 degrees at 30 watts for  60 seconds each as bonus radiofrequency applications.  The patient was  then observed with no further PVCs or nonsustained ventricular  tachycardia.  Ventricular pacing was performed from the right  ventricular apex with no PVCs induced during ventricular pacing.  Programmed extra stimulus testing was performed from the right  ventricular apex down to 500/220 milliseconds, which was the  ventricular  ERP with no PVCs observed during pacing.  Atrial pacing was then  performed from the distal coronary sinus, which revealed no PVCs during  pacing with an AV Wenckebach cycle length of 320 milliseconds.  The  patient was asked to bear down and cough and no PVCs were observed.  Isoproterenol was infused at 2 mcg per minute with an adequate heart  rate response.  Ventricular pacing was again performed from the right  ventricular apex position during isoproterenol infusion down to a cycle  length of 260 milliseconds with no PVCs observed with pacing.  Pacing  was performed from the right ventricular apex down to a cycle length of  220 milliseconds with no PVCs observed and no tachycardias induced  during isoproterenol infusion.  Isoproterenol was therefore allowed to  wash out, and the patient was again observed no further premature  ventricular contractions.  I again had him cough and increased his intra-  abdominal pressure with no PVCs observed.  The total observation period  following ablation was 50 minutes with no PVCs observed.  The procedure  was therefore considered completed.  All catheters were removed and the  sheaths were aspirated and flushed.  The sheaths were removed and  hemostasis was assured.  A limited bedside transthoracic echocardiogram  revealed no pericardial effusion.  There were no early apparent  complications.   CONCLUSIONS:  1. Normal sinus rhythm upon presentation.  2. No evidence of dual AV nodal physiology or accessory pathways with      no supraventricular tachycardia induced.  3. Frequent premature ventricular contractions of a left bundle-branch      right inferior axis morphology with nonsustained ventricular      tachycardia observed of the same morphology.  This was a focal      tachycardia, which was successfully mapped to the anteroseptal      right ventricular outflow tract and successfully ablated in this      location.  4. No  ventricular tachycardia or ventricular fibrillation induced with      programmed extra stimulus testing from the right ventricular base      and apex positions respectively.  5. Successful ventricular tachycardia ablation with no further PVCs or      nonsustained ventricular tachycardia following ablation.  6. No early apparent complications.      [facility] Grayer, MD  Electronically Signed     JA/MEDQ  D:  06/02/2008  T:  06/03/2008  Job:  [id number]   cc:   Tery Sanfilippo, MD

## 2010-08-21 NOTE — Letter (Signed)
February 25, 2008    [name] Acosta, [name] Acosta  [name] [address]   RE:  [name], [name] Acosta  MRN:  [id number]  /  DOB:  January 16, 1934   Dear [name] Acosta,   It was my pleasure to see your patient [name] Acosta in  electrophysiologic followup today.  As you recall, he is a very pleasant  77 year old gentleman with coronary artery disease and nonsustained  ventricular tachycardia.  I was introduced to [name] Acosta during his  recent hospitalization on January 18, 2008, through January 22, 2008.  At that time, [name] Acosta performed a percutaneous coronary  intervention to the patient's mid left anterior descending coronary  artery.  He had frequent premature ventricular contractions and  nonsustained ventricular tachycardia thereafter.  We placed the patient  on oral amiodarone therapy at that time.   Since hospital discharge, the patient reports doing very well.  He has  had no further chest discomfort, shortness of breath, presyncope, or  syncope.  He denies any significant palpitations.  He is otherwise doing  well.   PROBLEM LIST:  1. Coronary artery disease.  2. Preserved ejection fraction.  3. Hypertension.  4. Nonsustained ventricular tachycardia.  5. Hyperlipidemia.  6. Diabetes mellitus.  7. History of prostatic cancer.  8. Anemia.   MEDICATIONS:  1. Aspirin 325 mg daily.  2. Plavix 75 mg daily.  3. Amiodarone 200 mg b.i.d.  4. Metformin 1000 mg b.i.d.  5. Ferrous sulfate daily.  6. Glipizide 7.5 mg daily.  7. Famotidine 20 mg b.i.d.  8. Lisinopril 40 mg daily.  9. Amlodipine 10 mg daily.  10.Multivitamin daily.  11.Vitamin D 1000 international units daily.  12.Megace 20 mg daily.  13.Metoprolol 25 mg b.i.d.  14.Fish oil 1000 mg daily.  15.Lipitor 20 mg daily.   ALLERGIES:  No known drug allergies.   PHYSICAL EXAMINATION:  VITAL SIGNS:  Blood pressure 132/79, heart rate  76, respirations 18, and weight 213 pounds.  GENERAL:  The patient is a  well-appearing male in no acute distress.  He  is alert and oriented x3.  HEENT:  Normocephalic, atraumatic.  Sclerae clear.  Conjunctivae pink.  Oropharynx is clear.  NECK:  Supple.  No JVD, lymphadenopathy, or bruits.  LUNGS:  Clear to auscultation bilaterally.  HEART:  Regular rate and rhythm.  No murmurs, rubs, or gallops.  GI:  Soft, nontender, nondistended.  Positive bowel sounds.  EXTREMITIES:  No clubbing, cyanosis, or edema.  NEUROLOGIC:  Strength and sensation are intact.  SKIN:  No ecchymosis or lacerations.  MUSCULOSKELETAL:  No deformity or atrophy.   EKG:  EKG today reveals normal sinus rhythm with no premature  ventricular contractions.  The EKG is otherwise unremarkable.   IMPRESSION:  [name] Acosta is a very pleasant 77 year old gentleman with a  history of coronary artery disease status post recent percutaneous  coronary intervention to the mid left anterior descending vessel on  December 28, 2007, who was observed to have frequent premature  ventricular contractions and nonsustained ventricular tachycardia in the  setting of acute coronary syndrome.  At that time, we opted to place the  patient on amiodarone for several weeks and then to assess for  symptomatic ventricular tachycardia.  The patient has had no symptomatic  PVCs or ventricular tachycardia since that time.  He denies presyncope  or syncope.  His EKG today is normal.  I think that the most prudent  strategy for this patient would be to discontinue  his amiodarone at this  time.  I have taken the liberty to increase his metoprolol to 50 mg  twice daily.  Should the patient have symptomatic ventricular  tachycardia in the future, then we could consider reinitiation of an  antiarrhythmic at that time.  The patient was diagnosed with iron  deficiency anemia during his hospital stay and reports completing his  ferritin therapy today.  I will therefore obtain a ferritin and  hematocrit today.  Should the  patient have ongoing iron deficiency  anemia, then I have instructed him to pursue further followup through  his primary care physician.   PLAN:  1. Amiodarone is discontinued.  2. Increase metoprolol to 50 mg twice daily.  3. We will obtain a CBC and ferritin level today.  4. I have returned the entirety of his care back to your office.      Please contact me should he develop further symptomatic premature      ventricular contractions or ventricular tachycardia.   [name], I appreciate the opportunity of participating in the care of Mr.  Acosta.  Please feel free to contact me if you wish to discuss his care  further.    Sincerely,      [name] Grayer, MD  Electronically Signed    JA/MedQ  DD: 02/25/2008  DT: 02/26/2008  Job #: [id number]   CC:    [name], M.D.

## 2010-08-21 NOTE — Cardiovascular Report (Signed)
[name], [name]               ACCOUNT NO.:  [id number]   MEDICAL RECORD NO.:  [id number]          PATIENT TYPE:  INP   LOCATION:  2904                         FACILITY:  [facility]   PHYSICIAN:  Quay Burow, M.D.   DATE OF BIRTH:  1933-11-26   DATE OF PROCEDURE:  DATE OF DISCHARGE:  12/28/2007                            CARDIAC CATHETERIZATION   [name] is a 77 year old mildly overweight African American male with  a history hypertension and diabetes.  He was admitted on January 18, 2008, with unstable angina, shortness of breath, and nonsustained VT.  Here, he was ruled out for myocardial infarction.  He was cathed by Dr.  Tery Sanfilippo revealing a 40% ostial LAD and a 70-90% segmental mid  LAD.  LV gram was not performed.  He presents now for PCI stenting using  a Promus drug-eluting stent and Angiomax.   PROCEDURE DESCRIPTION:  The patient had already received aspirin #2, 600  mg of Plavix p.o., and Pepcid IV as well as Angiomax bolus infusion with  an ACT of 375.  The 6-French sheath was exchanged over a wire for a 7-  [place] sheath.  Using a 7-French XB 3.5 with side-hole guide catheter  along with __________ Sonny Masters soft wire and a 2.5 x 15 Voyager balloon,  PTCA was performed in the mid LAD.  Following this, a 27.5 x 28 Promus  drug-eluting stent was then deployed at 16 atmospheres (3.0 mm) and  postdilated with a 3.0 x 20 [place] Voyager at 18 atmospheres (3.1 mm)  resulting in reduction of an 80-90% segmental mid LAD with 0% residual  with excellent flow.  The patient tolerated the procedure well.  There  was no hemodynamic or electrocardiographic sequelae.   Abdominal aortography was performed using 25 mL of Visipaque dye at 20  mL per second with a pigtail catheter revealing widely patent renal  arteries.  Infrarenal abdominal aorta and iliac bifurcation appear free  of significant atherosclerotic changes.   IMPRESSION:  [name] had successful mid left anterior  descending  percutaneous coronary intervention stenting using a Promus drug-eluting  stent and Angiomax.  He tolerated the procedure well.  The guide wire  and catheter removed.  Sheath was sewn securely in place.  The patient  left the lab in stable condition.  He will be treated with aspirin and  Plavix, gently hydrated.  His rhythm will be addressed.  He left the lab  in stable condition on IV amiodarone in sinus rhythm, hemodynamically  stable.      Quay Burow, M.D.  Electronically Signed     JB/MEDQ  D:  01/19/2008  T:  01/20/2008  Job:  [id number]   cc:   Tery Sanfilippo, MD  [name], M.D.  [facility]

## 2010-08-21 NOTE — Consult Note (Signed)
NAMECOLIN, BEHRLE               ACCOUNT NO.:  [id number]   MEDICAL RECORD NO.:  [id number]          PATIENT TYPE:  INP   LOCATION:  4729                         FACILITY:  [facility]   PHYSICIAN:  [facility] Grayer, MD       DATE OF BIRTH:  Jun 30, 1933   DATE OF CONSULTATION:  DATE OF DISCHARGE:                                 CONSULTATION   CONSULTING PHYSICIAN:  Ulice Dash R. Einar Gip, MD   REASON FOR CONSULTATION:  Ventricular tachycardia.   HISTORY OF PRESENT ILLNESS:  Mr. Bethancourt is a pleasant 77 year old  gentleman with a history of nonsustained ventricular tachycardia,  coronary artery disease, hypertension, and a preserved left ventricular  ejection fraction.  He was admitted on March 27, 2008 for further  evaluation and management of chest discomfort.  He was noted to have  chest discomfort while shoveling snow on  March 27, 2008 which lasted  approximately 20 minutes and stopped with resting.  He subsequently had  several episodes of brief fluttering of his chest lasting 2-3 seconds  without associated chest pain, shortness of breath, presyncope or  syncope.  He presented to Urgent Care and was subsequently transferred  to [facility] for further care.  He ruled out for myocardial infarction  but has been noted to have nonsustained ventricular tachycardia as well  as premature ventricular contractions.  He was previously treated with  amiodarone for nonsustained ventricular tachycardia and this was  discontinued several weeks ago.  The patient has done quite well since  that time without further symptomatic ventricular tachycardia up until  the time of this admission.   PAST MEDICAL HISTORY:  1. Coronary artery disease status post recent percutaneous coronary      intervention, October 2009.  2. Preserved ejection fraction.  3. Hypertension.  4. Hyperlipidemia.  5. Diabetes mellitus.  6. Nonsustained ventricular tachycardia.  7. Iron-deficiency anemia.  8. History of prostate  cancer.   CURRENT MEDICATIONS:  1. Metoprolol 75 mg twice daily.  2. Plavix 75 mg daily.  3. Insulin.  4. Lisinopril 40 mg daily.  5. Glipizide 7.5 mg daily.  6. Metformin 1000 mg b.i.d.  7. Norvasc 10 mg daily.  8. Simvastatin 40 mg daily.  9. Pepcid 20 mg b.i.d.  10.Aspirin 325 mg daily.   ALLERGIES:  No known drug allergies.   SOCIAL HISTORY:  The patient lives in [place].  He has a history of  tobacco use, but quit 22 years ago.  He denies alcohol or drug use.   FAMILY HISTORY:  Coronary artery disease.   REVIEW OF SYSTEMS:  All systems were reviewed and negative except as  outlined in the HPI above.   PHYSICAL EXAMINATION:  VITAL SIGNS:  Blood pressure 120/82, heart rate  75, respirations 18, temperature 98.1.  GENERAL:  The patient is a well-appearing male in no acute distress.  He  is alert and oriented x3.  HEENT:  Normocephalic, atraumatic.  Sclerae clear.  Conjunctivae pink.  Oropharynx is clear.  NECK:  Supple.  No JVD, lymphadenopathy, or bruits.  LUNGS:  Clear to auscultation bilaterally.  HEART:  Regular rate and rhythm.  No murmurs, rubs, or gallops.  GI:  Soft, nontender, nondistended.  Positive bowel sounds.  EXTREMITIES:  No clubbing, cyanosis, or edema.  NEUROLOGIC:  Cranial nerves II through XII are intact.  Strength and  sensation are intact.  SKIN:  No ecchymoses or lacerations.  MUSCULOSKELETAL:  No deformity or atrophy.  PSYCHIATRIC:  Euthymic mood.  Full affect.   LABORATORY DATA:  Cardiac markers are negative.  Hematocrit 36.9.  TSH  2.188.   DIAGNOSTIC STUDIES:  EKG from today reveals sinus rhythm at 60 beats per  minute.  PR intervals 134 milliseconds, QT intervals 400 milliseconds,  otherwise unremarkable.   An EKG from March 27, 2008 reveals normal sinus rhythm with frequent  PVCs.  The PVC morphology is of a left bundle-branch right inferior axis  morphology with a QRS duration of 140 milliseconds.   Telemetry reveals  predominantly sinus rhythm, rare nonsustained  ventricular tachycardia, and premature ventricular contractions of a  monomorphic nature are noted.   IMPRESSION:  Mr. Exner is a pleasant 77 year old gentleman with a  history of coronary artery disease and a preserved ejection fraction.  He was admitted with chest discomfort which has subsequently resolved.  He appears to have nonsustained ventricular tachycardia as well as  premature ventricular contractions.  His PVC morphology is consistent  with an out flow tract focus or possibly an mitral annular source.  Given that the patient is predominately asymptomatic, I think that our  present strategy should be medical therapy.  I would recommend that we  increase his metoprolol at this time to 100 mg twice daily.  Should the  patient develop symptomatic ventricular tachycardia, then we could  either consider an antiarrhythmic medication or catheter ablation at  that time.   PLAN:  1. Increase metoprolol to 100 mg twice daily as tolerated.  2. The patient will follow-up with me in clinic in 2 weeks to further      assess symptoms of his ventricular tachycardia.      [facility] Grayer, MD  Electronically Signed     JA/MEDQ  D:  03/28/2008  T:  03/29/2008  Job:  [id number]   cc:   Eden Lathe. Einar Gip, MD

## 2010-08-21 NOTE — Consult Note (Signed)
NAMEGIANNI, [name]               ACCOUNT NO.:  [id number]   MEDICAL RECORD NO.:  [id number]          PATIENT TYPE:  INP   LOCATION:  2904                         FACILITY:  [facility]   PHYSICIAN:  Thompson Grayer, MD       DATE OF BIRTH:  1934-03-10   DATE OF CONSULTATION:  DATE OF DISCHARGE:                                 CONSULTATION   REQUESTING PHYSICIAN:  Tery Sanfilippo, MD   PRIMARY CARE PHYSICIAN:  [name], MD   REASON FOR CONSULTATION:  Nonsustained ventricular tachycardia.   HISTORY OF PRESENT ILLNESS:  [name] is a pleasant 77 year old  gentleman with a history of hypertension and hyperlipidemia who was  admitted on January 18, 2008, with unstable angina.  The patient reports  being in good health until January 18, 2008, when he developed  progressive symptoms of shortness of breath and dyspnea with minimal  activities.  The patient also developed dizziness and diaphoresis.  He  presented to his primary care physician's office where he had an EKG  obtained and then was referred to [facility] for further care.  He subsequently underwent left heart catheterization, which revealed 80-  90% mid occlusion of the left anterior descending vessel, which was  successfully stented with a Promus drug-eluting stent.  The patient  reports significant improvement in his dyspnea since that time.  He  reports a rare and brief shortness of breath lasting several seconds.  He denies significant palpitations, chest discomfort, near syncope, or  syncope.  On telemetry, he has been observed to have frequent runs of  nonsustained ventricular tachycardia.  The tachycardia is of a left  bundle-branch inferior axis with a cycle length of approximately 320  milliseconds.  The patient has been initiated on amiodarone, which he  appears to be tolerating.  Attempts of beta-blockade have been limited  due to bradycardia.  Interestingly, a previous Myoview in August 2008  revealed  frequent premature ventricular contractions and they were  unable to get this study due to nonsustained ventricular tachycardia.  An echocardiogram has been performed this admission, which reveals a  preserved left ventricular ejection fraction.   PAST MEDICAL HISTORY:  1. Hypertension.  2. Hyperlipidemia.  3. Diabetes mellitus.   CURRENT MEDICATIONS:  1. Metoprolol 25 mg b.i.d.  2. Lisinopril 40 mg daily.  3. Simvastatin 40 mg nightly.  4. Norvasc 10 mg daily.  5. Glipizide 7.5 mg daily.  6. Aspirin 325 mg daily.  7. Protonix 40 mg daily.  8. Plavix 75 mg daily.  9. Amiodarone 400 mg t.i.d.  10.Sublingual nitroglycerin p.r.n.   ALLERGIES:  No known drug allergies.   SOCIAL HISTORY:  The patient lives in [place].  He has a history of  tobacco, but quit 22 years ago.  He denies alcohol or drug use.   FAMILY HISTORY:  Notable for coronary artery disease and leukemia.   REVIEW OF SYSTEMS:  All systems are reviewed and negative except as  outlined in the HPI above.   PHYSICAL EXAMINATION:  VITAL SIGNS:  Blood pressure 113/69, heart rate  65, respirations 19,  sats 100%, and afebrile.  GENERAL:  The patient is a well-appearing male in no acute distress.  He  is alert and oriented x3.  HEENT:  Normocephalic and atraumatic.  Sclerae are clear.  Conjunctivae  are pink.  Oropharynx is clear.  NECK:  Supple.  No JVD, lymphadenopathy, or bruits.  LUNGS:  Clear to auscultation bilaterally.  HEART:  Regular rate and rhythm.  No murmurs, rubs, or gallops.  GI:  Soft, nontender, and nondistended.  Positive bowel sounds.  EXTREMITIES:  No clubbing, cyanosis, or edema.  NEUROLOGIC:  Strength and sensation are intact.  PSYCH:  Euthymic mood, full affect.  SKIN:  No ecchymosis or lacerations.   LABORATORY DATA:  Sodium 139, potassium 4.6, and creatinine 0.91.  Liver  profile normal.  Hematocrit 33.   EKG, normal sinus rhythm with no infarct pattern observed.  There is a  left  bundle-branch, inferior axis morphology, PVC, and focus firing in a  nonsustained ventricular tachycardia pattern.   IMPRESSION:  [name] is a very pleasant 77 year old gentleman with a  history of diabetes, hypertension, and hyperlipidemia, who was admitted  with acute coronary syndrome and found to have stenosis within his left  anterior descending.  The patient's coronary artery disease has been  successfully treated with a drug-eluting stent.  He is currently doing  well with no further symptoms of ischemia.  The patient is observed to  have frequent nonsustained ventricular tachycardia on telemetry.  He has  been initiated on amiodarone with a significant decline in the frequency  of his nonsustained ventricular tachycardia.  Interestingly, the  patient's previous Myoview from 2008 notes that the study could not be  gated due to the nonsustained ventricular tachycardia.  I therefore  suspect that the patient's premature ventricular contractions are  longstanding and likely focal in origin potentially from the right or  left ventricular outflow tract.  Given the patient's preserved ejection  fraction, I do not think that they pretend a risk for increased sudden  death.   PLAN:  Therapeutic strategies for premature ventricular contractions  were discussed in detail with the patient today including primarily  medicine-based therapies.  I think at the present time it is reasonable  to continue amiodarone, however, I would decrease the dose to 200 mg  twice daily.  We should cover the patient with amiodarone over the next  few weeks and allow him to recover from his recent ischemic event.  Thereafter, I think that we should discontinue his amiodarone and  observe for symptomatic PVCs.  I would recommend therefore that we have  the patient follow up in my office in 4 weeks.  At that time, I will  discontinue the patient's amiodarone if he is doing well.  We could then  obtain a  24-hour Holter to evaluate his PVC burden.  Should the patient  have a significant  symptomatic nonsustained ventricular tachycardia or PVCs despite  recovering from his ischemic event and with medical therapy, I think  that ablation therapy might be appropriate down the road.  I appreciate  the opportunity to participate in his care.      Thompson Grayer, MD  Electronically Signed     JA/MEDQ  D:  01/21/2008  T:  01/22/2008  Job:  [id number]   cc:   Tery Sanfilippo, MD  [name], M.D.

## 2010-08-21 NOTE — Letter (Signed)
April 28, 2008    Tery Sanfilippo, [address]  [place], [place] [address]   RE:  GARETT, PIPPINS  MRN:  [id number]  /  DOB:  [birth year]   Dear Madison Hickman,   It was my pleasure to see Oliverio Doepke in Electrophysiology followup  today.  As you recall, he is a pleasant 77 year old gentleman with a  history of coronary artery disease and nonsustained ventricular  tachycardia.  During his recent hospitalization, he was noted to have  symptomatic PVCs.  We initiated verapamil, which appear to initially  control his PVCs.  Unfortunately, he has had increasing palpitations  since that time.  He notes that with exertion during cardiac rehab he  had nonsustained ventricular tachycardia observed.  He reports symptoms  of palpitations and dyspnea during tachycardia.  He denies chest pain,  presyncope, or syncope.  He is, otherwise, without complaint today.   PROBLEM:  1. Nonsustained ventricular tachycardia and frequent premature      ventricular contractions (as above).  2. Coronary artery disease.  3. Preserved ejection fraction.  4. Hyperlipidemia.  5. Diabetes mellitus.  6. History of anemia.  7. History of prostate cancer.   CURRENT MEDICATIONS:  1. Aspirin 325 mg daily.  2. Plavix 75 mg daily.  3. Metformin 1000 mg b.i.d.  4. Famotidine 20 mg b.i.d.  5. Lisinopril 40 mg daily.  6. Multivitamin daily.  7. Vitamin D 1000 IU daily.  8. Megace 20 mg daily.  9. Metoprolol 50 mg b.i.d.  10.Verapamil 240 mg daily.  11.Fish oil 1000 mg daily.  12.Lipitor 20 mg daily.  13.Glipizide 7.5 mg daily.   PHYSICAL EXAMINATION:  VITALS:  Blood pressure 136/74, heart rate 84,  respirations 18, and weight 218 pounds.  GENERAL:  The patient is a well-appearing male in no acute distress.  He  is alert and oriented x3.  HEENT:  Normocephalic, atraumatic.  Sclerae are clear.  Conjunctivae are  pink.  Oropharynx is clear.  NECK:  Supple.  No JVD, thyromegaly, or bruits.  LUNGS:  Clear  to auscultation bilaterally.  HEART:  Regular rate and rhythm with frequent ectopy.  No murmurs, rubs,  or gallops.  GI:  Soft, nontender, and nondistended.  Positive bowel sounds.  EXTREMITIES:  No clubbing or cyanosis.  Trace lower extremity edema.  NEUROLOGIC:  Nonfocal.  SKIN:  No ecchymosis or lacerations.  MUSCULOSKELETAL:  No deformity or atrophy.   EKG reveals sinus rhythm with frequent premature ventricular  contractions often occurring in couplets.  The PVC morphology is of a  left bundle-branch, right inferior axis with the QRS transition, and  precordial leads between V2 and V3.   IMPRESSION:  Mr. Rugama is a pleasant 77 year old gentleman with a  history of coronary artery disease and a preserved ejection fraction who  presents for further management of symptomatic nonsustained ventricular  tachycardia and frequent premature ventricular contractions.  He has  failed medical therapy with both metoprolol and verapamil.  His  premature ventricular contraction focus appears to be of an outflow  tract origin.  He is clearly quite symptomatic with his premature  ventricular contractions at the present time.   PLAN:  Therapeutic strategies for nonsustained ventricular tachycardia  and premature ventricular contractions were discussed in detail with the  patient today including both medicine and catheter-based therapies.  Risks, benefits, and alternatives to EP study and radiofrequency  ablation of the PVC focus was also discussed.  The patient understands  the  risks including, but not limited to bleeding, vascular damage,  cardiac perforation, tamponade, and damage to the  normal conduction system.  He wishes to proceed with catheter ablation.  We will, therefore, schedule him for EP study and radiofrequency  ablation for ventricular tachycardia at the next available time.    Sincerely,      Thompson Grayer, MD  Electronically Signed    JA/MedQ  DD: 04/28/2008  DT:  04/29/2008  Job #: [id number]   CC:    [name], M.D.

## 2010-08-21 NOTE — Discharge Summary (Signed)
[name], [name]               ACCOUNT NO.:  [id number]   MEDICAL RECORD NO.:  [id number]          PATIENT TYPE:  OIB   LOCATION:  2899                         FACILITY:  [facility]   PHYSICIAN:  Thompson Grayer, MD       DATE OF BIRTH:  08-11-1933   DATE OF ADMISSION:  05/13/2008  DATE OF DISCHARGE:  05/13/2008                               DISCHARGE SUMMARY   This patient has no known drug allergies.   Time for this dictation, greater than 40 minutes.   FINAL DIAGNOSES:  1. Electrophysiology study, May 13, 2008 with an attempted      anatomical mapping of nonsustained ventricular      tachycardia/premature ventricular contractions.      a.     At the study, the patient presented with low premature       ventricular contraction burden.  2. Premature ventricular contractions, too infrequent despite Isuprel      and the procedure was cancelled.      a.     Premature ventricular contractions have an morphology of       left bundle-branch block, right inferior axis.   SECONDARY DIAGNOSES:  1. Coronary artery disease presenting with acute coronary syndrome,      October 2009, the patient had PCI to the mid-left anterior      descending.  His ejection fraction was shown to be preserved.  2. Nonsustained ventricular tachycardia, frequent premature      ventricular contractions noted at hospitalization in October 2009.      a.     They are symptomatic.  He has palpitation and dyspnea.      b.     Suppression of ventricular ectopy with amiodarone therapy.      c.     Amiodarone discontinued, November 2009.      d.     The patient was hospitalized for atypical chest pain,       December 2009 with recurrence of nonsustained ventricular       tachycardia.  The patient treated with verapamil at that time.      e.     Recurrent symptoms of nonsustained ventricular tachycardia       palpitations despite verapamil therapy.  3. Dyslipidemia.  4. Diabetes.  5. History of prostate cancer.  6.  Anemia.  7. Hypertension.   BRIEF HISTORY:  [name] is a 77 year old male.  He has NSVT and PVCs  which are symptomatic.  His symptoms are palpitations and dyspnea.   The patient came to cardiology attention in October 2009.  He was  admitted with an acute coronary syndrome and received PCI to the mid  left anterior descending artery at that time.  Also noted during that  hospitalization were symptomatic NSVT and frequent PVCs.  The patient  was started on amiodarone at that time and seemed to have complete  suppression of his ventricular ectopy.  He was seen in followup in  November 2009 and at that time he was doing so well with amiodarone was  discontinued.  He had a rehospitalization in December 2009  for atypical  chest pain and at that time verapamil was started for recurrent symptoms  of NSVT.  Now, he presents with recurrent symptoms despite verapamil  therapy.   He notes that when he his exerting himself in cardiac rehab that NSVT is  observed.  He denies chest pain, presyncope, or syncope and is otherwise  without complaint.   Therapeutic strategies for nonsustained ventricular tachycardia and PVCs  were discussed in detail, they include both medical therapy and catheter-  based therapies.  The risks and benefits of an electrophysiology study  and radiofrequency catheter ablation of the PVC focus was discussed.  The patient wishes to undergo electrophysiology study, possible  radiofrequency catheter ablation.  The risks and benefits have been  described to the patient and he will be scheduled for the next available  time.   HOSPITAL COURSE:  The patient presents to [facility] electively  on May 13, 2008.  The patient's verapamil had been held for 48 hours  prior to the procedure.  Once in the electrophysiology lab, the patient  had low PVC burden.  They were too infrequent to allow anatomical  mapping.  Even with Isuprel, the burden remained low and the  procedure  was cancelled.  The patient was discharged on the same day.  The patient  goes home with the following medications.   DISCHARGE MEDICATIONS:  1. Enteric-coated aspirin 325 mg daily.  2. Plavix 75 mg daily.  3. Metformin 1000 mg twice daily.  4. Famotidine 20 mg twice daily.  5. Lisinopril 40 mg daily.  6. Multivitamin daily.  7. Vitamin D 1000 units daily.  8. Megestrol 20 mg twice daily.  9. Metoprolol 50 mg twice daily.  10.Fish oil capsule 1000 mg daily.  11.Lipitor 20 mg daily at bedtime.  12.Verapamil SA 240 mg.  13.Glipizide 5 mg one to one half tabs daily.   DISCHARGE INSTRUCTIONS:  The patient will follow up with Dr. Rayann Heman at  [facility] in 4 weeks.      [name], [place]      Thompson Grayer, MD  Electronically Signed    GM/MEDQ  D:  05/13/2008  T:  05/14/2008  Job:  [id number]   cc:   Tery Sanfilippo, MD  Thompson Grayer, MD  Dr. Nyoka Cowden

## 2010-10-03 LAB — LIPID PANEL
Cholesterol: 141 mg/dL (ref 0–200)
HDL: 48 mg/dL (ref 35–70)
LDL Cholesterol: 61 mg/dL
LDl/HDL Ratio: 2.9
Triglycerides: 162 mg/dL — AB (ref 40–160)

## 2010-11-17 LAB — HEPATIC FUNCTION PANEL
ALT: 15 U/L (ref 10–40)
AST: 15 U/L (ref 14–40)
Alkaline Phosphatase: 52 U/L (ref 25–125)

## 2010-11-17 LAB — BASIC METABOLIC PANEL
Potassium: 4.9 mmol/L (ref 3.4–5.3)
Sodium: 131 mmol/L — AB (ref 137–147)

## 2011-01-08 LAB — COMPREHENSIVE METABOLIC PANEL
ALT: 15
ALT: 16
AST: 15
AST: 18
Albumin: 3 — ABNORMAL LOW
Albumin: 3.4 — ABNORMAL LOW
Alkaline Phosphatase: 30 — ABNORMAL LOW
Alkaline Phosphatase: 35 — ABNORMAL LOW
BUN: 11
BUN: 8
CO2: 20
CO2: 24
Calcium: 9.1
Calcium: 9.3
Chloride: 106
Chloride: 109
Creatinine, Ser: 0.91
Creatinine, Ser: 0.96
GFR calc Af Amer: >60
GFR calc Af Amer: >60
GFR calc non Af Amer: >60
GFR calc non Af Amer: >60
Glucose, Bld: 150 — ABNORMAL HIGH
Glucose, Bld: 187 — ABNORMAL HIGH
Potassium: 3.6
Potassium: 4.6
Sodium: 134 — ABNORMAL LOW
Sodium: 139
Total Bilirubin: 0.7
Total Bilirubin: 0.7
Total Protein: 5.7 — ABNORMAL LOW
Total Protein: 6.2

## 2011-01-08 LAB — GLUCOSE, CAPILLARY
Glucose-Capillary: 111 — ABNORMAL HIGH
Glucose-Capillary: 115 — ABNORMAL HIGH
Glucose-Capillary: 115 — ABNORMAL HIGH
Glucose-Capillary: 115 — ABNORMAL HIGH
Glucose-Capillary: 124 — ABNORMAL HIGH
Glucose-Capillary: 143 — ABNORMAL HIGH
Glucose-Capillary: 145 — ABNORMAL HIGH
Glucose-Capillary: 148 — ABNORMAL HIGH
Glucose-Capillary: 164 — ABNORMAL HIGH
Glucose-Capillary: 198 — ABNORMAL HIGH
Glucose-Capillary: 123 mg/dL — ABNORMAL HIGH (ref 70–99)
Glucose-Capillary: 130 mg/dL — ABNORMAL HIGH (ref 70–99)
Glucose-Capillary: 131 mg/dL — ABNORMAL HIGH (ref 70–99)
Glucose-Capillary: 147 mg/dL — ABNORMAL HIGH (ref 70–99)
Glucose-Capillary: 164 mg/dL — ABNORMAL HIGH (ref 70–99)
Glucose-Capillary: 191 mg/dL — ABNORMAL HIGH (ref 70–99)
Glucose-Capillary: 183 mg/dL — ABNORMAL HIGH (ref 70–99)

## 2011-01-08 LAB — BASIC METABOLIC PANEL
BUN: 12
BUN: 7
BUN: 9
CO2: 21
CO2: 23
CO2: 23
Calcium: 8.9
Calcium: 8.9
Calcium: 9.2
Chloride: 108
Chloride: 108
Chloride: 111
Creatinine, Ser: 0.91
Creatinine, Ser: 0.91
Creatinine, Ser: 0.92
GFR calc Af Amer: >60
GFR calc Af Amer: >60
GFR calc Af Amer: >60
GFR calc non Af Amer: >60
GFR calc non Af Amer: >60
GFR calc non Af Amer: >60
Glucose, Bld: 123 — ABNORMAL HIGH
Glucose, Bld: 125 — ABNORMAL HIGH
Glucose, Bld: 153 — ABNORMAL HIGH
Potassium: 3.8
Potassium: 4
Potassium: 4.6
Sodium: 136
Sodium: 139
Sodium: 139

## 2011-01-08 LAB — DIFFERENTIAL
Basophils Absolute: 0
Basophils Absolute: 0
Basophils Absolute: 0
Basophils Relative: 0
Basophils Relative: 0
Basophils Relative: 1
Eosinophils Absolute: 0.1
Eosinophils Absolute: 0.2
Eosinophils Absolute: 0.2
Eosinophils Relative: 2
Eosinophils Relative: 2
Eosinophils Relative: 4
Lymphocytes Relative: 12
Lymphocytes Relative: 15
Lymphocytes Relative: 19
Lymphs Abs: 0.7
Lymphs Abs: 1
Lymphs Abs: 1.1
Monocytes Absolute: 0.5
Monocytes Absolute: 0.6
Monocytes Absolute: 0.7
Monocytes Relative: 10
Monocytes Relative: 12
Monocytes Relative: 8
Neutro Abs: 3.7
Neutro Abs: 4.4
Neutro Abs: 5.1
Neutrophils Relative %: 66
Neutrophils Relative %: 74
Neutrophils Relative %: 74

## 2011-01-08 LAB — CBC
HCT: 31.3 — ABNORMAL LOW
HCT: 31.8 — ABNORMAL LOW
HCT: 33 — ABNORMAL LOW
HCT: 33.7 — ABNORMAL LOW
HCT: 34 — ABNORMAL LOW
HCT: 34.6 — ABNORMAL LOW
Hemoglobin: 10.3 — ABNORMAL LOW
Hemoglobin: 10.6 — ABNORMAL LOW
Hemoglobin: 11 — ABNORMAL LOW
Hemoglobin: 11.1 — ABNORMAL LOW
Hemoglobin: 11.2 — ABNORMAL LOW
Hemoglobin: 11.4 — ABNORMAL LOW
MCHC: 32.8
MCHC: 32.8
MCHC: 32.9
MCHC: 32.9
MCHC: 33.4
MCHC: 33.7
MCV: 88.9
MCV: 89.6
MCV: 89.8
MCV: 90.5
MCV: 90.6
MCV: 90.6
Platelets: 152
Platelets: 154
Platelets: 166
Platelets: 167
Platelets: 168
Platelets: 184
RBC: 3.45 — ABNORMAL LOW
RBC: 3.55 — ABNORMAL LOW
RBC: 3.72 — ABNORMAL LOW
RBC: 3.72 — ABNORMAL LOW
RBC: 3.76 — ABNORMAL LOW
RBC: 3.85 — ABNORMAL LOW
RDW: 14.8
RDW: 14.9
RDW: 15
RDW: 15
RDW: 15.2
RDW: 15.2
WBC: 5
WBC: 5.1
WBC: 5.6
WBC: 6
WBC: 6.8
WBC: 8.6

## 2011-01-08 LAB — HEPARIN LEVEL (UNFRACTIONATED)
Heparin Unfractionated: 0.47
Heparin Unfractionated: <0.1 — ABNORMAL LOW
Heparin Unfractionated: <0.1 — ABNORMAL LOW

## 2011-01-08 LAB — LIPID PANEL
Cholesterol: 110
Cholesterol: 114
HDL: 29 — ABNORMAL LOW
HDL: 33 — ABNORMAL LOW
LDL Cholesterol: 55
LDL Cholesterol: 58
Total CHOL/HDL Ratio: 3.5
Total CHOL/HDL Ratio: 3.8
Triglycerides: 114
Triglycerides: 131
VLDL: 23
VLDL: 26

## 2011-01-08 LAB — APTT: aPTT: 82 — ABNORMAL HIGH

## 2011-01-08 LAB — CK TOTAL AND CKMB (NOT AT ARMC)
CK, MB: 0.7
CK, MB: 0.9
CK, MB: 1
Relative Index: INVALID
Relative Index: INVALID
Relative Index: INVALID
Total CK: 45
Total CK: 55
Total CK: 58

## 2011-01-08 LAB — CARDIAC PANEL(CRET KIN+CKTOT+MB+TROPI)
CK, MB: 0.8
Relative Index: INVALID
Total CK: 44
Troponin I: 0.02

## 2011-01-08 LAB — MAGNESIUM: Magnesium: 1.9

## 2011-01-08 LAB — TROPONIN I
Troponin I: 0.01
Troponin I: 0.03
Troponin I: <0.01

## 2011-01-08 LAB — PROTIME-INR
INR: 1
Prothrombin Time: 13.3

## 2011-01-11 LAB — COMPREHENSIVE METABOLIC PANEL
ALT: 14 U/L (ref 0–53)
AST: 13 U/L (ref 0–37)
Albumin: 3.2 g/dL — ABNORMAL LOW (ref 3.5–5.2)
Alkaline Phosphatase: 27 U/L — ABNORMAL LOW (ref 39–117)
BUN: 10 mg/dL (ref 6–23)
CO2: 17 mEq/L — ABNORMAL LOW (ref 19–32)
Calcium: 8.8 mg/dL (ref 8.4–10.5)
Chloride: 112 mEq/L (ref 96–112)
Creatinine, Ser: 0.85 mg/dL (ref 0.4–1.5)
GFR calc Af Amer: >60 mL/min (ref >60)
GFR calc non Af Amer: >60 mL/min (ref >60)
Glucose, Bld: 148 mg/dL — ABNORMAL HIGH (ref 70–99)
Potassium: 4.3 mEq/L (ref 3.5–5.1)
Sodium: 138 mEq/L (ref 135–145)
Total Bilirubin: 0.5 mg/dL (ref 0.3–1.2)
Total Protein: 5.4 g/dL — ABNORMAL LOW (ref 6.0–8.3)

## 2011-01-11 LAB — BASIC METABOLIC PANEL
BUN: 10 mg/dL (ref 6–23)
BUN: 8 mg/dL (ref 6–23)
CO2: 18 mEq/L — ABNORMAL LOW (ref 19–32)
CO2: 22 mEq/L (ref 19–32)
Calcium: 8.8 mg/dL (ref 8.4–10.5)
Calcium: 9.5 mg/dL (ref 8.4–10.5)
Chloride: 106 mEq/L (ref 96–112)
Chloride: 111 mEq/L (ref 96–112)
Creatinine, Ser: 0.9 mg/dL (ref 0.4–1.5)
Creatinine, Ser: 0.9 mg/dL (ref 0.4–1.5)
GFR calc Af Amer: >60 mL/min (ref >60)
GFR calc Af Amer: >60 mL/min (ref >60)
GFR calc non Af Amer: >60 mL/min (ref >60)
GFR calc non Af Amer: >60 mL/min (ref >60)
Glucose, Bld: 167 mg/dL — ABNORMAL HIGH (ref 70–99)
Glucose, Bld: 192 mg/dL — ABNORMAL HIGH (ref 70–99)
Potassium: 4.5 mEq/L (ref 3.5–5.1)
Potassium: 4.9 mEq/L (ref 3.5–5.1)
Sodium: 137 mEq/L (ref 135–145)
Sodium: 138 mEq/L (ref 135–145)

## 2011-01-11 LAB — CBC
HCT: 34.3 % — ABNORMAL LOW (ref 39.0–52.0)
HCT: 35.3 % — ABNORMAL LOW (ref 39.0–52.0)
HCT: 35.5 % — ABNORMAL LOW (ref 39.0–52.0)
HCT: 36.8 % — ABNORMAL LOW (ref 39.0–52.0)
HCT: 36.9 % — ABNORMAL LOW (ref 39.0–52.0)
HCT: 40.7
Hemoglobin: 11.3 g/dL — ABNORMAL LOW (ref 13.0–17.0)
Hemoglobin: 11.4 g/dL — ABNORMAL LOW (ref 13.0–17.0)
Hemoglobin: 11.5 g/dL — ABNORMAL LOW (ref 13.0–17.0)
Hemoglobin: 11.9 g/dL — ABNORMAL LOW (ref 13.0–17.0)
Hemoglobin: 12.3 g/dL — ABNORMAL LOW (ref 13.0–17.0)
Hemoglobin: 13.8
MCHC: 32.1 g/dL (ref 30.0–36.0)
MCHC: 32.3 g/dL (ref 30.0–36.0)
MCHC: 32.6 g/dL (ref 30.0–36.0)
MCHC: 33 g/dL (ref 30.0–36.0)
MCHC: 33.4 g/dL (ref 30.0–36.0)
MCHC: 34
MCV: 85.3 fL (ref 78.0–100.0)
MCV: 86.6 fL (ref 78.0–100.0)
MCV: 87.6 fL (ref 78.0–100.0)
MCV: 87.8 fL (ref 78.0–100.0)
MCV: 88.5 fL (ref 78.0–100.0)
MCV: 85.9
Platelets: 156 10*3/uL (ref 150–400)
Platelets: 167 10*3/uL (ref 150–400)
Platelets: 167 10*3/uL (ref 150–400)
Platelets: 176 10*3/uL (ref 150–400)
Platelets: 189 10*3/uL (ref 150–400)
Platelets: 167
RBC: 3.96 MIL/uL — ABNORMAL LOW (ref 4.22–5.81)
RBC: 4.02 MIL/uL — ABNORMAL LOW (ref 4.22–5.81)
RBC: 4.05 MIL/uL — ABNORMAL LOW (ref 4.22–5.81)
RBC: 4.17 MIL/uL — ABNORMAL LOW (ref 4.22–5.81)
RBC: 4.31 MIL/uL (ref 4.22–5.81)
RBC: 4.74
RDW: 15.1 % (ref 11.5–15.5)
RDW: 15.2 % (ref 11.5–15.5)
RDW: 15.2 % (ref 11.5–15.5)
RDW: 15.3 % (ref 11.5–15.5)
RDW: 15.4 % (ref 11.5–15.5)
RDW: 14.2
WBC: 4.7 10*3/uL (ref 4.0–10.5)
WBC: 5 10*3/uL (ref 4.0–10.5)
WBC: 5 10*3/uL (ref 4.0–10.5)
WBC: 5 10*3/uL (ref 4.0–10.5)
WBC: 5.5 10*3/uL (ref 4.0–10.5)
WBC: 7

## 2011-01-11 LAB — GLUCOSE, CAPILLARY
Glucose-Capillary: 104 mg/dL — ABNORMAL HIGH (ref 70–99)
Glucose-Capillary: 131 mg/dL — ABNORMAL HIGH (ref 70–99)
Glucose-Capillary: 136 mg/dL — ABNORMAL HIGH (ref 70–99)
Glucose-Capillary: 138 mg/dL — ABNORMAL HIGH (ref 70–99)
Glucose-Capillary: 140 mg/dL — ABNORMAL HIGH (ref 70–99)
Glucose-Capillary: 149 mg/dL — ABNORMAL HIGH (ref 70–99)
Glucose-Capillary: 152 mg/dL — ABNORMAL HIGH (ref 70–99)
Glucose-Capillary: 167 mg/dL — ABNORMAL HIGH (ref 70–99)
Glucose-Capillary: 168 mg/dL — ABNORMAL HIGH (ref 70–99)
Glucose-Capillary: 172 mg/dL — ABNORMAL HIGH (ref 70–99)
Glucose-Capillary: 184 mg/dL — ABNORMAL HIGH (ref 70–99)

## 2011-01-11 LAB — HEPARIN LEVEL (UNFRACTIONATED)
Heparin Unfractionated: 0.6 IU/mL (ref 0.30–0.70)
Heparin Unfractionated: <0.1 IU/mL — ABNORMAL LOW (ref 0.30–0.70)
Heparin Unfractionated: <0.1 IU/mL — ABNORMAL LOW (ref 0.30–0.70)

## 2011-01-11 LAB — DIFFERENTIAL
Basophils Absolute: 0 10*3/uL (ref 0.0–0.1)
Basophils Relative: 0 % (ref 0–1)
Eosinophils Absolute: 0.1 10*3/uL (ref 0.0–0.7)
Eosinophils Relative: 2 % (ref 0–5)
Lymphocytes Relative: 18 % (ref 12–46)
Lymphs Abs: 0.9 10*3/uL (ref 0.7–4.0)
Monocytes Absolute: 0.5 10*3/uL (ref 0.1–1.0)
Monocytes Relative: 10 % (ref 3–12)
Neutro Abs: 3.5 10*3/uL (ref 1.7–7.7)
Neutrophils Relative %: 69 % (ref 43–77)

## 2011-01-11 LAB — CARDIAC PANEL(CRET KIN+CKTOT+MB+TROPI)
CK, MB: 1 ng/mL (ref 0.3–4.0)
CK, MB: 1.1 ng/mL (ref 0.3–4.0)
Relative Index: INVALID (ref 0.0–2.5)
Relative Index: INVALID (ref 0.0–2.5)
Total CK: 45 U/L (ref 7–232)
Total CK: 52 U/L (ref 7–232)
Troponin I: <0.01 ng/mL (ref 0.00–0.06)
Troponin I: <0.01 ng/mL (ref 0.00–0.06)

## 2011-01-11 LAB — MAGNESIUM
Magnesium: 2 mg/dL (ref 1.5–2.5)
Magnesium: 2.1 mg/dL (ref 1.5–2.5)

## 2011-01-11 LAB — PROTIME-INR
INR: 0.9 (ref 0.00–1.49)
Prothrombin Time: 12.2 seconds (ref 11.6–15.2)

## 2011-01-11 LAB — LIPID PANEL
Cholesterol: 103 mg/dL (ref 0–200)
HDL: 25 mg/dL — ABNORMAL LOW (ref >39)
LDL Cholesterol: 57 mg/dL (ref 0–99)
Total CHOL/HDL Ratio: 4.1 RATIO
Triglycerides: 103 mg/dL (ref <150)
VLDL: 21 mg/dL (ref 0–40)

## 2011-01-11 LAB — HEMOGLOBIN A1C
Hgb A1c MFr Bld: 6.6 % — ABNORMAL HIGH (ref 4.6–6.1)
Mean Plasma Glucose: 143 mg/dL

## 2011-01-11 LAB — CK TOTAL AND CKMB (NOT AT ARMC)
CK, MB: 1.2 ng/mL (ref 0.3–4.0)
Relative Index: INVALID (ref 0.0–2.5)
Total CK: 55 U/L (ref 7–232)

## 2011-01-11 LAB — TSH: TSH: 2.188 u[IU]/mL (ref 0.350–4.500)

## 2011-01-11 LAB — APTT: aPTT: 27 seconds (ref 24–37)

## 2011-01-11 LAB — TROPONIN I: Troponin I: 0.01 ng/mL (ref 0.00–0.06)

## 2011-01-25 LAB — CBC AND DIFFERENTIAL
HCT: 36 % — AB (ref 41–53)
Hemoglobin: 11.2 g/dL — AB (ref 13.5–17.5)
Platelets: 251 10*3/uL (ref 150–399)

## 2011-03-08 LAB — BASIC METABOLIC PANEL: Glucose: 150 mg/dL

## 2011-03-08 LAB — HEMOGLOBIN A1C: Hgb A1c MFr Bld: 7 % — AB (ref 4.0–6.0)

## 2011-05-22 ENCOUNTER — Other Ambulatory Visit: Payer: Self-pay | Admitting: Family Medicine

## 2011-06-04 ENCOUNTER — Ambulatory Visit (INDEPENDENT_AMBULATORY_CARE_PROVIDER_SITE_OTHER): Payer: PRIVATE HEALTH INSURANCE | Admitting: Internal Medicine

## 2011-06-04 DIAGNOSIS — R310 Gross hematuria: Secondary | ICD-10-CM

## 2011-06-04 LAB — POCT UA - MICROSCOPIC ONLY
Casts, Ur, LPF, POC: NEGATIVE
Crystals, Ur, HPF, POC: NEGATIVE
Mucus, UA: NEGATIVE
WBC, Ur, HPF, POC: NEGATIVE
Yeast, UA: NEGATIVE

## 2011-06-04 LAB — POCT URINALYSIS DIPSTICK
Bilirubin, UA: NEGATIVE
Glucose, UA: NEGATIVE
Ketones, UA: NEGATIVE
Leukocytes, UA: NEGATIVE
Nitrite, UA: NEGATIVE
Spec Grav, UA: 1.01
Urobilinogen, UA: 0.2
pH, UA: 7

## 2011-06-04 NOTE — Progress Notes (Signed)
  Subjective:    Patient ID: [name], male    DOB: May 03, 1933, 78 y.o.   MRN: [id number]  HPINoted onset of gross hematuria approximately 2 weeks ago. This started immediately after his first attempt at intercourse since being treated for prostate cancer a few years ago. He felt objectively she occurring but had no admission of sperm and following this he saw blood when he went to urinate. He has had episodic gross hematuria ever since that is painless. His prostate cancer was treated with radiation and hormone therapy and resolved. His wife recently left him and left his finances in a shamble     Review of SystemsHis diabetes and hypertension and coronary artery disease have been stable     Objective:   Physical Exam Examination of the penis reveals no source of bleeding   UA has too numerous to count red blood cells and brown urine    Assessment & Plan:  Problem #1 gross hematuria This suggests bleeding from this source and the bladder or urethra or possibly from the tissues of the prostate and needs further evaluation by Dr. Janice Norrie, most likely with cystoscopy

## 2011-06-05 LAB — URINE CULTURE: Colony Count: 4000

## 2011-06-06 ENCOUNTER — Encounter: Payer: Self-pay | Admitting: *Deleted

## 2011-06-06 DIAGNOSIS — I10 Essential (primary) hypertension: Secondary | ICD-10-CM | POA: Insufficient documentation

## 2011-06-06 DIAGNOSIS — E785 Hyperlipidemia, unspecified: Secondary | ICD-10-CM | POA: Insufficient documentation

## 2011-06-06 DIAGNOSIS — E1169 Type 2 diabetes mellitus with other specified complication: Secondary | ICD-10-CM | POA: Insufficient documentation

## 2011-06-06 DIAGNOSIS — E119 Type 2 diabetes mellitus without complications: Secondary | ICD-10-CM

## 2011-06-06 DIAGNOSIS — G8929 Other chronic pain: Secondary | ICD-10-CM | POA: Insufficient documentation

## 2011-06-06 DIAGNOSIS — N529 Male erectile dysfunction, unspecified: Secondary | ICD-10-CM | POA: Insufficient documentation

## 2011-06-06 DIAGNOSIS — D649 Anemia, unspecified: Secondary | ICD-10-CM | POA: Insufficient documentation

## 2011-06-07 ENCOUNTER — Ambulatory Visit (INDEPENDENT_AMBULATORY_CARE_PROVIDER_SITE_OTHER): Payer: PRIVATE HEALTH INSURANCE | Admitting: Family Medicine

## 2011-06-07 ENCOUNTER — Encounter: Payer: Self-pay | Admitting: Family Medicine

## 2011-06-07 VITALS — BP 128/73 | HR 73 | Temp 97.7°F | Resp 16 | Ht 69.0 in | Wt 204.6 lb

## 2011-06-07 DIAGNOSIS — E119 Type 2 diabetes mellitus without complications: Secondary | ICD-10-CM

## 2011-06-07 LAB — POCT GLYCOSYLATED HEMOGLOBIN (HGB A1C): Hemoglobin A1C: 6.4

## 2011-06-07 LAB — GLUCOSE, POCT (MANUAL RESULT ENTRY): POC Glucose: 140

## 2011-06-07 MED ORDER — INSULIN GLARGINE 100 UNIT/ML ~~LOC~~ SOLN: 8.0000 [IU] | Freq: Every day | SUBCUTANEOUS | Status: DC

## 2011-06-07 NOTE — Progress Notes (Signed)
  Subjective:    Patient ID: NEKHI OEN, male    DOB: 1933/09/29, 78 y.o.   MRN: [id number]  HPI Demarri L Tenesaca is a 78 y.o. male Hx mult medical problems including prostate CA s/p radiation and lupron - recent had hematuria evaluated by urology - told had swollen prostate - now on Proscar. HX CAD s/p PTCA, DM2, HTN  DM2-    Thought he was supposed to inject med every other night.  Has been taking lantus 16 units every other night.  Metformin 1 gram qd. eating better - lost 10 pounds. Home CBG's: morning low 87, high 97.  Nighttime 100-120. No symptomatic lows. Feels good.  m  Microalbuminuria - on ARB.  Has cardiologist - follows for cad and HTN. Had labwork done at [facility] 1 1/2 months ago.  Review of Systems  Constitutional: Negative for fever and chills.  HENT: Negative for trouble swallowing.   Respiratory: Negative for cough and chest tightness.   Cardiovascular: Negative for chest pain.  Gastrointestinal: Negative for nausea, vomiting, abdominal pain, diarrhea and constipation.  Neurological: Negative for dizziness, weakness and light-headedness.  Psychiatric/Behavioral: Negative for dysphoric mood. The patient is not nervous/anxious.        Objective:   Physical Exam  Vitals reviewed. Constitutional: He is oriented to person, place, and time. He appears well-developed and well-nourished.  HENT:  Head: Normocephalic and atraumatic.  Neck: Normal range of motion.  Cardiovascular: Normal rate, regular rhythm, normal heart sounds and intact distal pulses.   No murmur heard. Pulmonary/Chest: Effort normal and breath sounds normal.  Abdominal: He exhibits no distension. There is no tenderness.  Neurological: He is alert and oriented to person, place, and time.  Skin: Skin is warm and dry.  Psychiatric: He has a normal mood and affect. His behavior is normal.   Results for orders placed in visit on 06/07/11  POCT GLYCOSYLATED HEMOGLOBIN (HGB A1C)      Component Value  Range   Hemoglobin A1C 6.4    GLUCOSE, POCT (MANUAL RESULT ENTRY)      Component Value Range   POC Glucose 140          Assessment & Plan:  ADHAM BORREGO is a 78 y.o. male DM2 - Good control, encouraged regarding diet changes and weight loss.   Discussed need for lantus QHS dosing.  Not sure of prior instruction confusion, but as well controlled on 16 QOD, will change to 8 units lantus qhs. Continue glucose monitoring, and RTC if running above 200, or any symptomatic lows.  Paper Rx given for insulin needles   HTN - controlled.   Hematuria - resolved, f/u with urology if recurs.  Recheck in 3 months.

## 2011-06-07 NOTE — Patient Instructions (Signed)
Decrease lantus to 8 units, but inject every night.  Continue same doses of other meds.  If blood sugars are running higher than 200, or any lower than 70, need to return to clinic.    Return to the clinic or go to the nearest emergency room if any of your symptoms worsen or new symptoms occur.

## 2011-06-08 ENCOUNTER — Other Ambulatory Visit: Payer: Self-pay | Admitting: Family Medicine

## 2011-08-05 ENCOUNTER — Other Ambulatory Visit: Payer: Self-pay | Admitting: Physician Assistant

## 2011-08-26 ENCOUNTER — Other Ambulatory Visit: Payer: Self-pay | Admitting: Physician Assistant

## 2011-08-27 ENCOUNTER — Other Ambulatory Visit: Payer: Self-pay | Admitting: Physician Assistant

## 2011-08-29 ENCOUNTER — Other Ambulatory Visit: Payer: Self-pay | Admitting: Physician Assistant

## 2011-09-13 ENCOUNTER — Ambulatory Visit (INDEPENDENT_AMBULATORY_CARE_PROVIDER_SITE_OTHER): Payer: PRIVATE HEALTH INSURANCE | Admitting: Family Medicine

## 2011-09-13 ENCOUNTER — Encounter: Payer: Self-pay | Admitting: Family Medicine

## 2011-09-13 VITALS — BP 137/68 | HR 71 | Temp 98.7°F | Resp 18 | Ht 71.0 in | Wt 209.6 lb

## 2011-09-13 DIAGNOSIS — E119 Type 2 diabetes mellitus without complications: Secondary | ICD-10-CM

## 2011-09-13 DIAGNOSIS — E785 Hyperlipidemia, unspecified: Secondary | ICD-10-CM

## 2011-09-13 LAB — COMPREHENSIVE METABOLIC PANEL
ALT: 12 U/L (ref 0–53)
AST: 14 U/L (ref 0–37)
Albumin: 4.1 g/dL (ref 3.5–5.2)
Alkaline Phosphatase: 35 U/L — ABNORMAL LOW (ref 39–117)
BUN: 12 mg/dL (ref 6–23)
CO2: 24 mEq/L (ref 19–32)
Calcium: 8.8 mg/dL (ref 8.4–10.5)
Chloride: 105 mEq/L (ref 96–112)
Creat: 0.93 mg/dL (ref 0.50–1.35)
Glucose, Bld: 102 mg/dL — ABNORMAL HIGH (ref 70–99)
Potassium: 4.1 mEq/L (ref 3.5–5.3)
Sodium: 137 mEq/L (ref 135–145)
Total Bilirubin: 0.4 mg/dL (ref 0.3–1.2)
Total Protein: 6.6 g/dL (ref 6.0–8.3)

## 2011-09-13 LAB — LIPID PANEL
Cholesterol: 112 mg/dL (ref 0–200)
HDL: 36 mg/dL — ABNORMAL LOW (ref >39)
LDL Cholesterol: 55 mg/dL (ref 0–99)
Total CHOL/HDL Ratio: 3.1 Ratio
Triglycerides: 107 mg/dL (ref <150)
VLDL: 21 mg/dL (ref 0–40)

## 2011-09-13 LAB — POCT GLYCOSYLATED HEMOGLOBIN (HGB A1C): Hemoglobin A1C: 6.5

## 2011-09-13 LAB — GLUCOSE, POCT (MANUAL RESULT ENTRY): POC Glucose: 117 mg/dl — AB (ref 70–99)

## 2011-09-13 MED ORDER — LISINOPRIL 20 MG PO TABS: 20.0000 mg | ORAL_TABLET | Freq: Every day | ORAL | Status: DC

## 2011-09-13 NOTE — Patient Instructions (Signed)
Check your meds at home and make sure they match our list.  If any refills needed prior to next office visit.  Return to the clinic or go to the nearest emergency room if any of your symptoms worsen or new symptoms occur. Your should receive a call or letter about your lab results within the next week to 10 days.

## 2011-09-13 NOTE — Progress Notes (Signed)
  Subjective:    Patient ID: [name], male    DOB: [birth year], 78 y.o.   MRN: [id number]  HPI [name] is a 78 y.o. male F/u DM2 On lantus 16u qod at last ov.  A1C 6.4 in March. Planned on lantus 8 units qhs.  Continued metformin 1000mg  BID.  No symptomatic lows.  Lowest is 87, highest 112.  No cough with lisinopril. Needs refill. Losing weight with diet and walking/exercising. Fasting since 6am  Feels well.  Hyperlipidemia - last lipids last year.  No problems with Lipitor.  Recent urology visit ok.    Review of Systems  Constitutional: Negative for fever and chills.  Respiratory: Negative for choking and shortness of breath.   Cardiovascular: Negative for chest pain.  Gastrointestinal: Negative for nausea and vomiting.  Musculoskeletal: Negative for myalgias and arthralgias.  Neurological: Negative for weakness and numbness.       Objective:   Physical Exam  Constitutional: He is oriented to person, place, and time. He appears well-developed and well-nourished.  HENT:  Head: Normocephalic and atraumatic.  Eyes: Pupils are equal, round, and reactive to light.  Cardiovascular: Normal rate, regular rhythm, normal heart sounds and intact distal pulses.   Pulmonary/Chest: Effort normal and breath sounds normal.  Abdominal: Soft. There is no tenderness.  Neurological: He is alert and oriented to person, place, and time.       Microfilament testing of feet normal bilaterally.  Skin: Skin is warm, dry and intact. No rash noted.  Psychiatric: He has a normal mood and affect. His behavior is normal.   Results for orders placed in visit on 09/13/11  GLUCOSE, POCT (MANUAL RESULT ENTRY)      Component Value Range   POC Glucose 117 (*) 70 - 99 (mg/dl)  POCT GLYCOSYLATED HEMOGLOBIN (HGB A1C)      Component Value Range   Hemoglobin A1C 6.5          Assessment & Plan:  [name] is a 78 y.o. male 1. Type II or unspecified type diabetes mellitus without mention  of complication, not stated as uncontrolled  POCT glucose (manual entry), POCT glycosylated hemoglobin (Hb A1C), Comprehensive metabolic panel  2. Other and unspecified hyperlipidemia  Lipid panel   Dm controlled - continue current regimen.  Recheck 3 months. Paper rx given for insulin needles.  Hyperlipidemia - prior controlled. Check labs above. No new changes.

## 2011-11-22 ENCOUNTER — Other Ambulatory Visit: Payer: Self-pay | Admitting: Physician Assistant

## 2011-12-13 ENCOUNTER — Ambulatory Visit: Payer: PRIVATE HEALTH INSURANCE | Admitting: Family Medicine

## 2011-12-20 ENCOUNTER — Ambulatory Visit (INDEPENDENT_AMBULATORY_CARE_PROVIDER_SITE_OTHER): Payer: PRIVATE HEALTH INSURANCE | Admitting: Family Medicine

## 2011-12-20 ENCOUNTER — Encounter: Payer: Self-pay | Admitting: Family Medicine

## 2011-12-20 VITALS — BP 138/62 | HR 88 | Temp 98.7°F | Resp 16 | Ht 69.0 in | Wt 201.2 lb

## 2011-12-20 DIAGNOSIS — E119 Type 2 diabetes mellitus without complications: Secondary | ICD-10-CM

## 2011-12-20 DIAGNOSIS — E785 Hyperlipidemia, unspecified: Secondary | ICD-10-CM

## 2011-12-20 DIAGNOSIS — I10 Essential (primary) hypertension: Secondary | ICD-10-CM

## 2011-12-20 LAB — GLUCOSE, POCT (MANUAL RESULT ENTRY): POC Glucose: 130 mg/dl — AB (ref 70–99)

## 2011-12-20 LAB — POCT GLYCOSYLATED HEMOGLOBIN (HGB A1C): Hemoglobin A1C: 6.6

## 2011-12-20 NOTE — Patient Instructions (Signed)
Continue same doses of medicines.  Recheck in 3 months for fasting blood work and office visit. Return to the clinic or go to the nearest emergency room if any of your symptoms worsen or new symptoms occur.

## 2011-12-20 NOTE — Progress Notes (Signed)
Subjective:    Patient ID: [name], male    DOB: 1933-12-14, 78 y.o.   MRN: [id number]  HPI [name] is a 78 y.o. male Hx mult medical problems including prostate CA s/p radiation and lupron - now on Proscar. HX CAD s/p PTCA, DM2, HTN  DM2 - last A1c 6.5 on 09/13/11.  Had worked on weight loss and diet to achieve this goal. Continued on  lantus 8 units qhs.  Continued metformin 1000mg  BID.  Home blood sugars:89-101. No symptomatic lows.   Hyperlipidemia - Lipid panel June 7th. Labs as below.   Results for orders placed in visit on 09/13/11  GLUCOSE, POCT (MANUAL RESULT ENTRY)      Component Value Range   POC Glucose 117 (*) 70 - 99 mg/dl  POCT GLYCOSYLATED HEMOGLOBIN (HGB A1C)      Component Value Range   Hemoglobin A1C 6.5    COMPREHENSIVE METABOLIC PANEL      Component Value Range   Sodium 137  135 - 145 mEq/L   Potassium 4.1  3.5 - 5.3 mEq/L   Chloride 105  96 - 112 mEq/L   CO2 24  19 - 32 mEq/L   Glucose, Bld 102 (*) 70 - 99 mg/dL   BUN 12  6 - 23 mg/dL   Creat 0.93  0.50 - 1.35 mg/dL   Total Bilirubin 0.4  0.3 - 1.2 mg/dL   Alkaline Phosphatase 35 (*) 39 - 117 U/L   AST 14  0 - 37 U/L   ALT 12  0 - 53 U/L   Total Protein 6.6  6.0 - 8.3 g/dL   Albumin 4.1  3.5 - 5.2 g/dL   Calcium 8.8  8.4 - 10.5 mg/dL  LIPID PANEL      Component Value Range   Cholesterol 112  0 - 200 mg/dL   Triglycerides 107  <150 mg/dL   HDL 36 (*) >39 mg/dL   Total CHOL/HDL Ratio 3.1     VLDL 21  0 - 40 mg/dL   LDL Cholesterol 55  0 - 99 mg/dL  continued on Niaspan 500mg  QD and Lipitor 10mg  qd.   HTN - home bp's: up 120-130/67.  No new side effects. No refills needed today. Now driving for [employer].  Review of Systems  Constitutional: Negative for fatigue and unexpected weight change.  Eyes: Negative for visual disturbance.  Respiratory: Negative for cough, chest tightness and shortness of breath.   Cardiovascular: Negative for chest pain, palpitations and leg  swelling.  Gastrointestinal: Negative for abdominal pain and blood in stool.  Neurological: Negative for dizziness, light-headedness and headaches.       Objective:   Physical Exam  Constitutional: He is oriented to person, place, and time. He appears well-developed and well-nourished.  HENT:  Head: Normocephalic and atraumatic.  Eyes: Pupils are equal, round, and reactive to light.  Cardiovascular: Normal rate, regular rhythm, normal heart sounds and intact distal pulses.   Pulmonary/Chest: Effort normal and breath sounds normal.  Abdominal: Soft. There is no tenderness.  Neurological: He is alert and oriented to person, place, and time.       Microfilament testing of feet normal bilaterally.  Skin: Skin is warm, dry and intact. No rash noted.  Psychiatric: He has a normal mood and affect. His behavior is normal.   Results for orders placed in visit on 12/20/11  GLUCOSE, POCT (MANUAL RESULT ENTRY)      Component Value Range   POC  Glucose 130 (*) 70 - 99 mg/dl  POCT GLYCOSYLATED HEMOGLOBIN (HGB A1C)      Component Value Range   Hemoglobin A1C 6.6          Assessment & Plan:  [name] is a 78 y.o. male 1. DM2 (diabetes mellitus, type 2)  POCT glucose (manual entry), POCT glycosylated hemoglobin (Hb A1C)  2. HTN (hypertension)    3. Hyperlipidemia     Htn - controlled. Cont current meds.   Hyperlipidemia - stable prior. Continue same doses meds, plan on lipids at next ov in 3 months.   DM2 - controlled.  Cont current meds at same doses.   Ok to refill meds before next ov in 3 months if needed.

## 2012-01-26 ENCOUNTER — Other Ambulatory Visit: Payer: Self-pay | Admitting: *Deleted

## 2012-01-26 MED ORDER — METFORMIN HCL 1000 MG PO TABS: 1000.0000 mg | ORAL_TABLET | Freq: Two times a day (BID) | ORAL | Status: DC

## 2012-02-24 ENCOUNTER — Other Ambulatory Visit: Payer: Self-pay | Admitting: Physician Assistant

## 2012-03-03 ENCOUNTER — Other Ambulatory Visit: Payer: Self-pay | Admitting: Physician Assistant

## 2012-03-03 MED ORDER — METOPROLOL TARTRATE 50 MG PO TABS: 50.0000 mg | ORAL_TABLET | Freq: Two times a day (BID) | ORAL | Status: DC

## 2012-03-12 ENCOUNTER — Other Ambulatory Visit: Payer: Self-pay | Admitting: Family Medicine

## 2012-03-16 ENCOUNTER — Ambulatory Visit (INDEPENDENT_AMBULATORY_CARE_PROVIDER_SITE_OTHER): Payer: PRIVATE HEALTH INSURANCE | Admitting: Family Medicine

## 2012-03-16 ENCOUNTER — Encounter: Payer: Self-pay | Admitting: Family Medicine

## 2012-03-16 VITALS — BP 126/70 | HR 85 | Temp 98.8°F | Resp 17 | Ht 71.0 in | Wt 210.0 lb

## 2012-03-16 DIAGNOSIS — E119 Type 2 diabetes mellitus without complications: Secondary | ICD-10-CM

## 2012-03-16 DIAGNOSIS — I2510 Atherosclerotic heart disease of native coronary artery without angina pectoris: Secondary | ICD-10-CM

## 2012-03-16 DIAGNOSIS — I10 Essential (primary) hypertension: Secondary | ICD-10-CM

## 2012-03-16 DIAGNOSIS — E782 Mixed hyperlipidemia: Secondary | ICD-10-CM

## 2012-03-16 DIAGNOSIS — E785 Hyperlipidemia, unspecified: Secondary | ICD-10-CM

## 2012-03-16 LAB — COMPREHENSIVE METABOLIC PANEL
ALT: 15 U/L (ref 0–53)
AST: 17 U/L (ref 0–37)
Albumin: 4.2 g/dL (ref 3.5–5.2)
Alkaline Phosphatase: 39 U/L (ref 39–117)
BUN: 15 mg/dL (ref 6–23)
CO2: 26 mEq/L (ref 19–32)
Calcium: 9.4 mg/dL (ref 8.4–10.5)
Chloride: 102 mEq/L (ref 96–112)
Creat: 1.04 mg/dL (ref 0.50–1.35)
Glucose, Bld: 118 mg/dL — ABNORMAL HIGH (ref 70–99)
Potassium: 4.3 mEq/L (ref 3.5–5.3)
Sodium: 136 mEq/L (ref 135–145)
Total Bilirubin: 0.4 mg/dL (ref 0.3–1.2)
Total Protein: 6.9 g/dL (ref 6.0–8.3)

## 2012-03-16 LAB — POCT GLYCOSYLATED HEMOGLOBIN (HGB A1C): Hemoglobin A1C: 6.6

## 2012-03-16 LAB — GLUCOSE, POCT (MANUAL RESULT ENTRY): POC Glucose: 130 mg/dl — AB (ref 70–99)

## 2012-03-16 LAB — LIPID PANEL
Cholesterol: 124 mg/dL (ref 0–200)
HDL: 44 mg/dL (ref >39)
LDL Cholesterol: 51 mg/dL (ref 0–99)
Total CHOL/HDL Ratio: 2.8 Ratio
Triglycerides: 147 mg/dL (ref <150)
VLDL: 29 mg/dL (ref 0–40)

## 2012-03-16 NOTE — Patient Instructions (Signed)
Your should receive a call or letter about your lab results within the next week to 10 days.  Call your cardiologist to discuss the niaspan. Recheck in 3 months.  Call us if medication refills need prior to that time.

## 2012-03-16 NOTE — Progress Notes (Signed)
Subjective:    Patient ID: [name], male    DOB: [birth year], 78 y.o.   MRN: [id number]  HPI [name] is a 78 y.o. male  Hx mult medical problems including prostate CA s/p radiation and lupron - now on Proscar. HX CAD s/p PTCA, DM2, HTN  DM2 - last A1c 6.6 on 12/20/11 ov.  Had worked on weight loss and diet to achieve good dm control. Continued on  lantus 8 units qhs.  Continued metformin 1000mg  BID.  Home blood sugars:  "normal - lowest 81, highest 120, no symptomatic lows, but carries candy if needed. Dentist appt pending -last seen 6 months ago. optho less than 1 year ago.   Hyperlipidemia - labs below from June 7th:  Has continued lipitor and niaspan.  Flushing with niaspan, but takes asa in the morning.  Next cardiology appt - next June.   No problems with the lipitor. Fasting today.  LIPID PANEL      Component Value Range   Cholesterol 112  0 - 200 mg/dL   Triglycerides 107  <150 mg/dL   HDL 36 (*) >39 mg/dL   Total CHOL/HDL Ratio 3.1     VLDL 21  0 - 40 mg/dL   LDL Cholesterol 55  0 - 99 mg/dL    Review of Systems  Constitutional: Negative for fatigue and unexpected weight change.  Eyes: Negative for visual disturbance.  Respiratory: Negative for cough, chest tightness and shortness of breath.   Cardiovascular: Negative for chest pain, palpitations and leg swelling.  Gastrointestinal: Negative for abdominal pain and blood in stool.  Neurological: Negative for dizziness, light-headedness and headaches.       Objective:   Physical Exam  Constitutional: He is oriented to person, place, and time. He appears well-developed and well-nourished.  HENT:  Head: Normocephalic and atraumatic.  Eyes: Pupils are equal, round, and reactive to light.  Cardiovascular: Normal rate, regular rhythm, normal heart sounds and intact distal pulses.   Pulmonary/Chest: Effort normal and breath sounds normal.  Abdominal: Soft. There is no tenderness.  Neurological: He is alert  and oriented to person, place, and time.       Microfilament testing of feet normal bilaterally.  Skin: Skin is warm, dry and intact. No rash noted.  Psychiatric: He has a normal mood and affect. His behavior is normal.      Results for orders placed in visit on 03/16/12  GLUCOSE, POCT (MANUAL RESULT ENTRY)      Component Value Range   POC Glucose 130 (*) 70 - 99 mg/dl  POCT GLYCOSYLATED HEMOGLOBIN (HGB A1C)      Component Value Range   Hemoglobin A1C 6.6         Assessment & Plan:   [name] is a 78 y.o. male  1. DM type 2 (diabetes mellitus, type 2)  Comprehensive metabolic panel, Lipid panel, POCT glucose (manual entry), POCT glycosylated hemoglobin (Hb A1C)  2. Hyperlipidemia  Comprehensive metabolic panel, Lipid panel  3. Coronary artery disease  Comprehensive metabolic panel, Lipid panel    DM - 2 controlled - continue same doses of Lantus and metformin. Not sure if he needs refills - he will call us to determine what he needs and we can send in 3 months supply.   No changes in other meds at present.  htn controlled and will check lipids.    Pt to call cards to discuss niaspan - may be able to pretreat with ASA.  Recheck in 3 months.   Patient Instructions  Your should receive a call or letter about your lab results within the next week to 10 days.  Call your cardiologist to discuss the niaspan. Recheck in 3 months.  Call us if medication refills need prior to that time.

## 2012-04-03 ENCOUNTER — Other Ambulatory Visit: Payer: Self-pay | Admitting: Physician Assistant

## 2012-04-08 ENCOUNTER — Telehealth: Payer: Self-pay | Admitting: Radiology

## 2012-04-08 NOTE — Telephone Encounter (Signed)
Faxed completed order form back to [facility].

## 2012-04-08 NOTE — Telephone Encounter (Signed)
Signed. Given to Ms. Ladell Pier, RN to fax

## 2012-04-08 NOTE — Telephone Encounter (Signed)
I have filled out form for his Diabetes meter and supplies, needs signature. It is at [employer] in [place] pool area

## 2012-05-02 ENCOUNTER — Other Ambulatory Visit: Payer: Self-pay | Admitting: Physician Assistant

## 2012-05-23 ENCOUNTER — Other Ambulatory Visit: Payer: Self-pay | Admitting: Physician Assistant

## 2012-06-04 ENCOUNTER — Other Ambulatory Visit: Payer: Self-pay | Admitting: Physician Assistant

## 2012-06-05 ENCOUNTER — Telehealth: Payer: Self-pay | Admitting: *Deleted

## 2012-06-08 NOTE — Telephone Encounter (Signed)
Error

## 2012-06-15 ENCOUNTER — Encounter: Payer: PRIVATE HEALTH INSURANCE | Admitting: Family Medicine

## 2012-06-15 NOTE — Progress Notes (Signed)
This encounter was created in error - please disregard.

## 2012-06-15 NOTE — Progress Notes (Deleted)
  Subjective:    Patient ID: [name], male    DOB: [birth year], 79 y.o.   MRN: [id number]  HPI [name] is a 79 y.o. male Hx prostate CA s/p radiation and lupron - now on Proscar. HX CAD s/p PTCA, DM2, HTN  DM2 - prior controlled with last A1c 6.6 in 03/16/12 - stable form prior ov. .  Had worked on weight loss and diet to achieve good dm control. Continued on  lantus 8 units qhs.  Continued metformin 1000mg  BID.    Dentist appt -  optho- about a year ago.   Hyperlipidemia - labs below from December ov.  Had continued lipitor and niaspan.  Flushing with niaspan, prior took asa in the morning - discussed evening dosing as pretreatment for flushing. Kept on same doses of lipitor/niaspan.   Hx of CAD. - Next cardiology appt in June.  HTN -   Results for orders placed in visit on 03/16/12  COMPREHENSIVE METABOLIC PANEL      Result Value Range   Sodium 136  135 - 145 mEq/L   Potassium 4.3  3.5 - 5.3 mEq/L   Chloride 102  96 - 112 mEq/L   CO2 26  19 - 32 mEq/L   Glucose, Bld 118 (*) 70 - 99 mg/dL   BUN 15  6 - 23 mg/dL   Creat 1.04  0.50 - 1.35 mg/dL   Total Bilirubin 0.4  0.3 - 1.2 mg/dL   Alkaline Phosphatase 39  39 - 117 U/L   AST 17  0 - 37 U/L   ALT 15  0 - 53 U/L   Total Protein 6.9  6.0 - 8.3 g/dL   Albumin 4.2  3.5 - 5.2 g/dL   Calcium 9.4  8.4 - 10.5 mg/dL  LIPID PANEL      Result Value Range   Cholesterol 124  0 - 200 mg/dL   Triglycerides 147  <150 mg/dL   HDL 44  >39 mg/dL   Total CHOL/HDL Ratio 2.8     VLDL 29  0 - 40 mg/dL   LDL Cholesterol 51  0 - 99 mg/dL  GLUCOSE, POCT (MANUAL RESULT ENTRY)      Result Value Range   POC Glucose 130 (*) 70 - 99 mg/dl  POCT GLYCOSYLATED HEMOGLOBIN (HGB A1C)      Result Value Range   Hemoglobin A1C 6.6        Review of Systems     Objective:   Physical Exam        Assessment & Plan:

## 2012-06-20 ENCOUNTER — Other Ambulatory Visit: Payer: Self-pay | Admitting: Physician Assistant

## 2012-06-25 ENCOUNTER — Telehealth: Payer: Self-pay

## 2012-06-25 MED ORDER — GLUCOSE BLOOD VI STRP: ORAL_STRIP | Status: DC

## 2012-06-25 NOTE — Telephone Encounter (Signed)
Pt requesting written rx to be picked up for diabetes test strips.   Best phone [phone]

## 2012-06-25 NOTE — Telephone Encounter (Signed)
Return pt call- pt has an appt with Dr. Nyoka Cowden for the end of March. Refilled strips for 30 days. Pt is aware and asked for script to be sent to [facility] on [facility].

## 2012-06-26 ENCOUNTER — Other Ambulatory Visit: Payer: Self-pay | Admitting: Radiology

## 2012-06-26 MED ORDER — GLUCOSE BLOOD VI STRP: ORAL_STRIP | Status: DC

## 2012-06-26 NOTE — Telephone Encounter (Signed)
refaxed test strips to [facility]

## 2012-07-01 ENCOUNTER — Other Ambulatory Visit: Payer: Self-pay | Admitting: Physician Assistant

## 2012-07-06 ENCOUNTER — Ambulatory Visit (INDEPENDENT_AMBULATORY_CARE_PROVIDER_SITE_OTHER): Payer: Medicare Other | Admitting: Family Medicine

## 2012-07-06 ENCOUNTER — Encounter: Payer: Self-pay | Admitting: Family Medicine

## 2012-07-06 VITALS — BP 134/76 | HR 70 | Temp 98.1°F | Resp 16 | Ht 69.0 in | Wt 209.2 lb

## 2012-07-06 DIAGNOSIS — E785 Hyperlipidemia, unspecified: Secondary | ICD-10-CM

## 2012-07-06 DIAGNOSIS — I10 Essential (primary) hypertension: Secondary | ICD-10-CM

## 2012-07-06 DIAGNOSIS — E119 Type 2 diabetes mellitus without complications: Secondary | ICD-10-CM

## 2012-07-06 LAB — POCT GLYCOSYLATED HEMOGLOBIN (HGB A1C): Hemoglobin A1C: 6.8

## 2012-07-06 LAB — GLUCOSE, POCT (MANUAL RESULT ENTRY): POC Glucose: 107 mg/dl — AB (ref 70–99)

## 2012-07-06 MED ORDER — ATORVASTATIN CALCIUM 10 MG PO TABS: 10.0000 mg | ORAL_TABLET | Freq: Every day | ORAL | Status: DC

## 2012-07-06 MED ORDER — METOPROLOL TARTRATE 50 MG PO TABS: 50.0000 mg | ORAL_TABLET | Freq: Two times a day (BID) | ORAL | Status: DC

## 2012-07-06 MED ORDER — NIACIN ER (ANTIHYPERLIPIDEMIC) 500 MG PO TBCR: 500.0000 mg | EXTENDED_RELEASE_TABLET | Freq: Every day | ORAL | Status: DC

## 2012-07-06 MED ORDER — LISINOPRIL 20 MG PO TABS: ORAL_TABLET | ORAL | Status: DC

## 2012-07-06 MED ORDER — LISINOPRIL-HYDROCHLOROTHIAZIDE 20-25 MG PO TABS: ORAL_TABLET | ORAL | Status: DC

## 2012-07-06 MED ORDER — GLUCOSE BLOOD VI STRP: ORAL_STRIP | Status: DC

## 2012-07-06 MED ORDER — INSULIN GLARGINE 100 UNIT/ML ~~LOC~~ SOLN: 8.0000 [IU] | Freq: Every day | SUBCUTANEOUS | Status: DC

## 2012-07-06 NOTE — Progress Notes (Signed)
Subjective:    Patient ID: [name], male    DOB: 1933-05-15, 79 y.o.   MRN: [id number]  HPI [name] is a 79 y.o. male Hx mult medical problems including prostate CA s/p radiation and lupron - now on Proscar. HX CAD s/p PTCA, DM2, HTN.   DM2 - last A1c 6.6 on 03/16/12 ov.  Had worked on weight loss and diet to achieve good dm control. Continued on  lantus 8 units qhs.  Continued metformin 1000mg  BID. Home blood sugars: 89, 92, 117 after eating.  no symptomatic lows, but carries candy if needed. Dentist appt: over a year - plans on scheduling later this year. Last optho visit: about a year, plans on scheduling visit soon.   Drinking coffee and drinks lots of water in am - increase in frequency in morning only no urinary  Frequency in afternoon or evening, no nocturia. No dysuria, no penile discharge.    Results for orders placed in visit on 03/16/12  COMPREHENSIVE METABOLIC PANEL      Result Value Range   Sodium 136  135 - 145 mEq/L   Potassium 4.3  3.5 - 5.3 mEq/L   Chloride 102  96 - 112 mEq/L   CO2 26  19 - 32 mEq/L   Glucose, Bld 118 (*) 70 - 99 mg/dL   BUN 15  6 - 23 mg/dL   Creat 1.04  0.50 - 1.35 mg/dL   Total Bilirubin 0.4  0.3 - 1.2 mg/dL   Alkaline Phosphatase 39  39 - 117 U/L   AST 17  0 - 37 U/L   ALT 15  0 - 53 U/L   Total Protein 6.9  6.0 - 8.3 g/dL   Albumin 4.2  3.5 - 5.2 g/dL   Calcium 9.4  8.4 - 10.5 mg/dL  LIPID PANEL      Result Value Range   Cholesterol 124  0 - 200 mg/dL   Triglycerides 147  <150 mg/dL   HDL 44  >39 mg/dL   Total CHOL/HDL Ratio 2.8     VLDL 29  0 - 40 mg/dL   LDL Cholesterol 51  0 - 99 mg/dL  GLUCOSE, POCT (MANUAL RESULT ENTRY)      Result Value Range   POC Glucose 130 (*) 70 - 99 mg/dl  POCT GLYCOSYLATED HEMOGLOBIN (HGB A1C)      Result Value Range   Hemoglobin A1C 6.6     Hyperlipidemia - December labs as above. Lipitor and Niaspan.  Next cardiology appt in June.  Aspirin after niaspan (misunderstood dosing to help  with flushing).  No new side effects with meds.   Not taking Celexa - no depression, no more irritatability, good mood.  No anhedonia. No Etoh.  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  Eyes: Negative for visual disturbance.  Respiratory: Negative for cough, chest tightness and shortness of breath.   Cardiovascular: Negative for chest pain, palpitations and leg swelling.  Gastrointestinal: Negative for abdominal pain and blood in stool.  Neurological: Negative for dizziness, light-headedness and headaches.       Objective:   Physical Exam  Constitutional: He is oriented to person, place, and time. He appears well-developed and well-nourished.  HENT:  Head: Normocephalic and atraumatic.  Eyes: Pupils are equal, round, and reactive to light.  Cardiovascular: Normal rate, regular rhythm, normal heart sounds and intact distal pulses.   Pulmonary/Chest: Effort normal and breath sounds normal.  Abdominal: Soft. There is no tenderness.  Neurological: He is alert and oriented to person, place, and time.  Microfilament testing of feet normal bilaterally.  Skin: Skin is warm, dry and intact. No rash noted.  Psychiatric: He has a normal mood and affect. His behavior is normal.    Results for orders placed in visit on 07/06/12  GLUCOSE, POCT (MANUAL RESULT ENTRY)      Result Value Range   POC Glucose 107 (*) 70 - 99 mg/dl  POCT GLYCOSYLATED HEMOGLOBIN (HGB A1C)      Result Value Range   Hemoglobin A1C 6.8         Assessment & Plan:  [name] is a 79 y.o. male DM II (diabetes mellitus, type II), controlled - Plan: POCT glucose (manual entry), POCT glycosylated hemoglobin (Hb A1C), insulin glargine (LANTUS) 100 UNIT/ML injection, glucose blood (TRUETEST TEST) test strip.  Controlled. Continue current regimen of lantus 8 units qhs, metformin 1 gram BID, and hypoglycemic precautions and plan again discussed. Reviewed med  list on [facility] and patient's personal med list for accuracy, but also encouraged him to review the AVS with exactly what he is taking at home for any differences and to call if this is the case.  Recheck in 3 months  HTN (hypertension) - Plan: lisinopril-hydrochlorothiazide (PRINZIDE,ZESTORETIC) 20-25 MG per tablet, lisinopril (PRINIVIL,ZESTRIL) 20 MG tablet, metoprolol (LOPRESSOR) 50 MG tablet, niacin (NIASPAN) 500 MG CR tablet.  Stable cont same meds.   Other and unspecified hyperlipidemia - Plan: atorvastatin (LIPITOR) 10 MG tablet refilled. Check labs in 65months.   Morning urinary frequency - discussed spreading out his water intake throughout the day, not just a large amount in the morning and that caffeine likely contributor. rtc if any worsening.  Meds ordered this encounter  Medications  . lisinopril-hydrochlorothiazide (PRINZIDE,ZESTORETIC) 20-25 MG per tablet    Sig: TAKE 1 TABLET BY MOUTH EVERY DAY    Dispense:  90 tablet    Refill:  1  . lisinopril (PRINIVIL,ZESTRIL) 20 MG tablet    Sig: TAKE 1 TABLET (20 MG TOTAL) BY MOUTH DAILY.    Dispense:  90 tablet    Refill:  1  . metoprolol (LOPRESSOR) 50 MG tablet    Sig: Take 1 tablet (50 mg total) by mouth 2 (two) times daily. Needs office visit    Dispense:  180 tablet    Refill:  1  . niacin (NIASPAN) 500 MG CR tablet    Sig: Take 1 tablet (500 mg total) by mouth at bedtime.    Dispense:  90 tablet    Refill:  1  . atorvastatin (LIPITOR) 10 MG tablet    Sig: Take 1 tablet (10 mg total) by mouth daily.    Dispense:  90 tablet    Refill:  1  . insulin glargine (LANTUS) 100 UNIT/ML injection    Sig: Inject 0.08 mLs (8 Units total) into the skin at bedtime.    Dispense:  10 mL    Refill:  3  . glucose blood (TRUETEST TEST) test strip    Sig: USE AS DIRECTED THREE TIMES DAILY    Dispense:  100 each    Refill:  3    DX Code 250.00   Patient Instructions  Recheck in 3 months.

## 2012-07-06 NOTE — Patient Instructions (Signed)
Recheck in 3 months.

## 2012-07-14 ENCOUNTER — Telehealth: Payer: Self-pay

## 2012-07-14 NOTE — Telephone Encounter (Signed)
Pt stated that pharmacy called back and they got it straightened out and it will only cost $40.

## 2012-07-14 NOTE — Telephone Encounter (Signed)
Pt states the diabetic vials are too expensive for him,please advise.   Best phone [phone] Pharmacy [facility]

## 2012-07-14 NOTE — Telephone Encounter (Signed)
Can not afford the Lantus.

## 2012-07-23 ENCOUNTER — Other Ambulatory Visit: Payer: Self-pay | Admitting: Physician Assistant

## 2012-08-26 ENCOUNTER — Other Ambulatory Visit: Payer: Self-pay | Admitting: Physician Assistant

## 2012-08-31 ENCOUNTER — Encounter: Payer: Self-pay | Admitting: *Deleted

## 2012-09-02 ENCOUNTER — Encounter: Payer: Self-pay | Admitting: Cardiovascular Disease

## 2012-09-02 ENCOUNTER — Ambulatory Visit (INDEPENDENT_AMBULATORY_CARE_PROVIDER_SITE_OTHER): Payer: Medicare Other | Admitting: Cardiovascular Disease

## 2012-09-02 VITALS — BP 138/68 | HR 70 | Ht 72.0 in | Wt 210.0 lb

## 2012-09-02 DIAGNOSIS — I2510 Atherosclerotic heart disease of native coronary artery without angina pectoris: Secondary | ICD-10-CM

## 2012-09-02 DIAGNOSIS — Z79899 Other long term (current) drug therapy: Secondary | ICD-10-CM

## 2012-09-02 DIAGNOSIS — I10 Essential (primary) hypertension: Secondary | ICD-10-CM

## 2012-09-02 DIAGNOSIS — E785 Hyperlipidemia, unspecified: Secondary | ICD-10-CM

## 2012-09-02 DIAGNOSIS — E119 Type 2 diabetes mellitus without complications: Secondary | ICD-10-CM

## 2012-09-02 NOTE — Assessment & Plan Note (Signed)
Under good control on his current antihypertensive medications

## 2012-09-02 NOTE — Patient Instructions (Signed)
  Your physician wants you to follow-up with him in : [place] will receive a reminder letter in the mail one month in advance. If you don't receive a letter, please call our office to schedule the follow-up appointment.   Your physician recommends that you return for lab work in: 6 WEEKS, [place], SEE ATTACHED LAB [place] has recommended you make the following change in your medication: STOP NIACIN

## 2012-09-02 NOTE — Assessment & Plan Note (Signed)
History of LAD stenting with a Promus drug-eluting stent October 2009. His last Myoview stress test performed 04/11/11 was nonischemic. He denies chest pain or shortness of breath.

## 2012-09-02 NOTE — Progress Notes (Signed)
09/02/2012 [name]   02-21-1934  [id number]  Primary Physician [name], MD Primary Cardiologist: Lorretta Harp MD Renae Gloss   HPI:  The patient is a 79 year old moderately overweight, divorced African American male, father of 3 children and 49 stepchildren, grandfather to 33 grandchildren, who is formerly a patient of Dr. Francine Graven. He has a history of CAD status post LAD stenting with a Promus drug-eluting stent, October of 2009. He had attempt at VT ablation by Dr. Thompson Grayer, February 2010; however, this was aborted because of inappropriate substrate. His other problems include hypertension, hyperlipidemia, diabetes, as well as history of prostate cancer. Since I saw him, he has been asymptomatic. His last Myoview performed 04/11/11 was completely normal. He did stop his aspirin because of rectal bleeding is noticed increased evening flushing with Niaspan.    Current Outpatient Prescriptions  Medication Sig Dispense Refill  . atorvastatin (LIPITOR) 10 MG tablet Take 1 tablet (10 mg total) by mouth daily.  90 tablet  1  . Calcium Carb-Cholecalciferol (CALCIUM 1000 + D PO) Take by mouth.      . finasteride (PROSCAR) 5 MG tablet Take 5 mg by mouth daily.      . fish oil-omega-3 fatty acids 1000 MG capsule Take 1 g by mouth daily.      Marland Kitchen glucose blood (TRUETEST TEST) test strip USE AS DIRECTED THREE TIMES DAILY  100 each  3  . insulin glargine (LANTUS) 100 UNIT/ML injection Inject 10 Units into the skin at bedtime.      Marland Kitchen lisinopril (PRINIVIL,ZESTRIL) 20 MG tablet TAKE 1 TABLET (20 MG TOTAL) BY MOUTH DAILY.  90 tablet  1  . lisinopril-hydrochlorothiazide (PRINZIDE,ZESTORETIC) 20-25 MG per tablet TAKE 1 TABLET BY MOUTH EVERY DAY  90 tablet  1  . metFORMIN (GLUCOPHAGE) 1000 MG tablet TAKE 1 TABLET (1,000 MG TOTAL) BY MOUTH 2 (TWO) TIMES DAILY.  60 tablet  4  . metoprolol (LOPRESSOR) 50 MG tablet Take 1 tablet (50 mg total) by mouth 2 (two) times daily. Needs office  visit  180 tablet  1  . Multiple Vitamin (MULTIVITAMIN) tablet Take 1 tablet by mouth daily.       No current facility-administered medications for this visit.    Allergies  Allergen Reactions  . Niacin And Related     flushing    History   Social History  . Marital Status: Legally Separated    Spouse Name: [name]    Number of Children: 7  . Years of Education: [name]   Occupational History  . RETIRED    Social History Main Topics  . Smoking status: Former Smoker -- 0.50 packs/day for 15 years    Types: Cigarettes    Quit date: 09/01/1990  . Smokeless tobacco: Not on file     Comment: per pt-quit about 29 yrs ago  . Alcohol Use: No  . Drug Use: No  . Sexually Active: No   Other Topics Concern  . Not on file   Social History Narrative   29 grandchildren. Single. Education: 12 th grade. Exercise: 3-4 times a week for 30 minutes working out and set ups.     Review of Systems: General: negative for chills, fever, night sweats or weight changes.  Cardiovascular: negative for chest pain, dyspnea on exertion, edema, orthopnea, palpitations, paroxysmal nocturnal dyspnea or shortness of breath Dermatological: negative for rash Respiratory: negative for cough or wheezing Urologic: negative for hematuria Abdominal: negative for nausea, vomiting, diarrhea, bright red blood per rectum,  melena, or hematemesis Neurologic: negative for visual changes, syncope, or dizziness All other systems reviewed and are otherwise negative except as noted above.    Blood pressure 138/68, pulse 70, height 6' (1.829 m), weight 210 lb (95.255 kg).  General appearance: alert and no distress Neck: no adenopathy, no carotid bruit, no JVD, supple, symmetrical, trachea midline and thyroid not enlarged, symmetric, no tenderness/mass/nodules Lungs: clear to auscultation bilaterally Heart: regular rate and rhythm, S1, S2 normal, no murmur, click, rub or gallop Extremities: extremities normal, atraumatic,  no cyanosis or edema  EKG normal sinus rhythm at 70 with sinus arrhythmia  ASSESSMENT AND PLAN:   CAD, NATIVE VESSEL History of LAD stenting with a Promus drug-eluting stent October 2009. His last Myoview stress test performed 04/11/11 was nonischemic. He denies chest pain or shortness of breath.  Hyperlipidemia He is on a statin drug and Niaspan. Dr. Nyoka Cowden follows his lipid profile closely which apparently was excellent recently. Since stopping aspirin because of rectal bleeding she's noticed increased flushing from his Niaspan. I told him he can stop this and we will recheck a lipid liver profile in 6-8 weeks.  Essential hypertension, benign Under good control on his current antihypertensive medications      Lorretta Harp MD [facility], [facility] 09/02/2012 10:15 AM

## 2012-09-02 NOTE — Assessment & Plan Note (Signed)
He is on a statin drug and Niaspan. Dr. Nyoka Cowden follows his lipid profile closely which apparently was excellent recently. Since stopping aspirin because of rectal bleeding she's noticed increased flushing from his Niaspan. I told him he can stop this and we will recheck a lipid liver profile in 6-8 weeks.

## 2012-10-12 ENCOUNTER — Telehealth: Payer: Self-pay | Admitting: Cardiovascular Disease

## 2012-10-12 NOTE — Telephone Encounter (Signed)
Returned call.  Pt informed labs were ordered at last visit and he just needs to present to the lab to have them drawn.  Pt verbalized understanding and agreed w/ plan.

## 2012-10-12 NOTE — Telephone Encounter (Signed)
[name] is calling because he needs a order for lab work and will like to pick up the order on tomorrow morning .Marland Kitchen    Thanks

## 2012-10-14 LAB — HEPATIC FUNCTION PANEL
ALT: 13 U/L (ref 0–53)
AST: 14 U/L (ref 0–37)
Albumin: 4.2 g/dL (ref 3.5–5.2)
Alkaline Phosphatase: 39 U/L (ref 39–117)
Bilirubin, Direct: 0.1 mg/dL (ref 0.0–0.3)
Indirect Bilirubin: 0.4 mg/dL (ref 0.0–0.9)
Total Bilirubin: 0.5 mg/dL (ref 0.3–1.2)
Total Protein: 6.7 g/dL (ref 6.0–8.3)

## 2012-10-14 LAB — LIPID PANEL
Cholesterol: 114 mg/dL (ref 0–200)
HDL: 37 mg/dL — ABNORMAL LOW (ref >39)
LDL Cholesterol: 41 mg/dL (ref 0–99)
Total CHOL/HDL Ratio: 3.1 Ratio
Triglycerides: 178 mg/dL — ABNORMAL HIGH (ref <150)
VLDL: 36 mg/dL (ref 0–40)

## 2012-10-19 ENCOUNTER — Other Ambulatory Visit: Payer: Self-pay | Admitting: Family Medicine

## 2012-10-19 NOTE — Telephone Encounter (Signed)
sent 

## 2012-10-20 ENCOUNTER — Other Ambulatory Visit: Payer: Self-pay

## 2012-10-20 MED ORDER — METFORMIN HCL 1000 MG PO TABS: 1000.0000 mg | ORAL_TABLET | Freq: Two times a day (BID) | ORAL | Status: DC

## 2012-12-09 ENCOUNTER — Telehealth: Payer: Self-pay

## 2012-12-09 DIAGNOSIS — R195 Other fecal abnormalities: Secondary | ICD-10-CM

## 2012-12-09 NOTE — Telephone Encounter (Signed)
Pt requesting call back from Dr [name] Hiss they are friends and he needs to talk with him???   Best phone for pt [phone]

## 2012-12-09 NOTE — Telephone Encounter (Signed)
Pt of Dr.Greene, was referred to a doctor for a colonoscopy and wants to know who that was. Call at [phone].

## 2012-12-10 NOTE — Telephone Encounter (Signed)
Called patient, he is asking about colonoscopy. He has had bright red blood in his stools, he has had this before and you wanted him to go to Dr Benson Norway for the Colonoscopy, but he could not go at the time because he owed money. He wants to know if we can send the order without a visit, or if he needs to come in. Pended order, he advised he did d/c his Aspirin already.

## 2012-12-11 ENCOUNTER — Other Ambulatory Visit: Payer: Self-pay | Admitting: Radiology

## 2012-12-11 DIAGNOSIS — K921 Melena: Secondary | ICD-10-CM

## 2012-12-11 NOTE — Telephone Encounter (Signed)
Called patient, he is advised.

## 2012-12-11 NOTE — Telephone Encounter (Signed)
Patient is asymptomatic other than the blood in his stools, he states the blood began a few weeks ago, and at that time he d/c his Aspirin. The blood resolved for a short time, now it has recurred over the last few days. He does not want to come here for this, he wants to go to Dr Benson Norway and have the colonoscopy since he is over due.

## 2012-12-11 NOTE — Telephone Encounter (Signed)
We can refer to Dr. Benson Norway - next 2 weeks if possible, ok to hold aspirin temporarily, but if blood not resolving in next few days - should be evaluated in office. Please let him know.  Thanks.

## 2012-12-11 NOTE — Telephone Encounter (Signed)
More info if possible.  Was this a single episode, or persistent blood in stools?  Any lightheadedness/dizziness/abd pain? If any other symptoms would recommend office visit to check hemoglobin and can check to see if external cause of bleeding.  We can also schedule him for eval by Dr. Benson Norway for Colonoscopy, but may need OV prior as above. Let me know if any questions.

## 2012-12-23 ENCOUNTER — Encounter: Payer: Self-pay | Admitting: Radiology

## 2012-12-23 DIAGNOSIS — K625 Hemorrhage of anus and rectum: Secondary | ICD-10-CM

## 2012-12-23 HISTORY — DX: Hemorrhage of anus and rectum: K62.5

## 2013-01-04 ENCOUNTER — Ambulatory Visit (INDEPENDENT_AMBULATORY_CARE_PROVIDER_SITE_OTHER): Payer: Medicare Other | Admitting: Family Medicine

## 2013-01-04 ENCOUNTER — Encounter: Payer: Self-pay | Admitting: Family Medicine

## 2013-01-04 VITALS — BP 138/80 | HR 76 | Temp 98.3°F | Resp 16 | Ht 69.0 in | Wt 210.0 lb

## 2013-01-04 DIAGNOSIS — Z23 Encounter for immunization: Secondary | ICD-10-CM

## 2013-01-04 DIAGNOSIS — E785 Hyperlipidemia, unspecified: Secondary | ICD-10-CM

## 2013-01-04 DIAGNOSIS — E119 Type 2 diabetes mellitus without complications: Secondary | ICD-10-CM

## 2013-01-04 DIAGNOSIS — I10 Essential (primary) hypertension: Secondary | ICD-10-CM

## 2013-01-04 LAB — BASIC METABOLIC PANEL
BUN: 16 mg/dL (ref 6–23)
CO2: 24 mEq/L (ref 19–32)
Calcium: 9.6 mg/dL (ref 8.4–10.5)
Chloride: 106 mEq/L (ref 96–112)
Creat: 1.21 mg/dL (ref 0.50–1.35)
Glucose, Bld: 108 mg/dL — ABNORMAL HIGH (ref 70–99)
Potassium: 4.3 mEq/L (ref 3.5–5.3)
Sodium: 137 mEq/L (ref 135–145)

## 2013-01-04 LAB — GLUCOSE, POCT (MANUAL RESULT ENTRY): POC Glucose: 126 mg/dl — AB (ref 70–99)

## 2013-01-04 LAB — POCT GLYCOSYLATED HEMOGLOBIN (HGB A1C): Hemoglobin A1C: 6.7

## 2013-01-04 MED ORDER — METOPROLOL TARTRATE 50 MG PO TABS: 50.0000 mg | ORAL_TABLET | Freq: Two times a day (BID) | ORAL | Status: DC

## 2013-01-04 MED ORDER — LISINOPRIL 20 MG PO TABS: ORAL_TABLET | ORAL | Status: DC

## 2013-01-04 MED ORDER — INSULIN GLARGINE 100 UNIT/ML ~~LOC~~ SOLN: 8.0000 [IU] | Freq: Every day | SUBCUTANEOUS | Status: DC

## 2013-01-04 MED ORDER — METFORMIN HCL 1000 MG PO TABS: 1000.0000 mg | ORAL_TABLET | Freq: Two times a day (BID) | ORAL | Status: DC

## 2013-01-04 MED ORDER — LISINOPRIL-HYDROCHLOROTHIAZIDE 20-25 MG PO TABS: ORAL_TABLET | ORAL | Status: DC

## 2013-01-04 MED ORDER — ATORVASTATIN CALCIUM 10 MG PO TABS: 10.0000 mg | ORAL_TABLET | Freq: Every day | ORAL | Status: DC

## 2013-01-04 NOTE — Patient Instructions (Addendum)
Diabetes is doing well - keep up the good work. Try to schedule eye doctor and dentist visit. No changes in medications for now.  Return to the clinic or go to the nearest emergency room if any of your symptoms worsen or new symptoms occur, including any bleeding.  You should receive a call or letter about your lab results within the next week to 10 days.   Phone number for dentists:  [facility] [phone] ext: [phone]  [facility] [phone]

## 2013-01-04 NOTE — Progress Notes (Signed)
Subjective:    Patient ID: [name], male    DOB: 1933/07/27, 79 y.o.   MRN: [id number]  HPI [name] is a 79 y.o. male  Last seen 07/06/12.  Hx mult medical problems including prostate CA s/p radiation and lupron.  HX CAD s/p PTCA, DM2, HTN.  Hx of prostate CA - followed by Dr. [name].  Taken off Proscar few months ago.   DM2 - last A1c 6.8 on 07/06/12.  Continued metformin 1000mg  BID. lantus 8 units at night.  Home blood sugars: lowest: 89, high 125, no symptomptaic lows.  no symptomatic lows, but carries candy if needed. Dentist appt: no recent appt - more than a few years. Cost prohibitive.  Last optho visit: 2 years ago.  Hyperlipidemia, hx of CAD, s/p cards eval in July - Dr [name].Marland Kitchen FLP WNL with Tchol 114, trig 178, HDL37, LDL 41 in July. Taking atorvastatin 10mg  qhs. No new side effects/myalgias,abd pain.  See phone messages re: blood in stool few months ago, Stopped asa, stopped Niaspan, no further bleeding, until  bleeding again few weeks ago after taking some cold medicine. See calls then.  Seen by Dr. Benson Norway few weeks ago. No specific tests.  Hx of colonoscopy in 2011. Planning on repeat colonoscopy in 2016.    Review of Systems  Constitutional: Negative for fatigue and unexpected weight change.  Eyes: Negative for visual disturbance.  Respiratory: Negative for cough, chest tightness and shortness of breath.   Cardiovascular: Negative for chest pain, palpitations and leg swelling.  Gastrointestinal: Negative for abdominal pain and blood in stool (in past - none recent. ).  Genitourinary: Negative for hematuria, decreased urine volume and difficulty urinating.  Musculoskeletal: Negative for myalgias.  Neurological: Negative for dizziness, light-headedness and headaches.       Objective:   Physical Exam  Vitals reviewed. Constitutional: He is oriented to person, place, and time. He appears well-developed and well-nourished.  HENT:  Head: Normocephalic and  atraumatic.  Eyes: Pupils are equal, round, and reactive to light.  Cardiovascular: Normal rate, regular rhythm, normal heart sounds and intact distal pulses.   Pulmonary/Chest: Effort normal and breath sounds normal.  Abdominal: Soft. There is no tenderness.  Neurological: He is alert and oriented to person, place, and time.  Microfilament testing of feet normal bilaterally.  Skin: Skin is warm, dry and intact. No rash noted.  Psychiatric: He has a normal mood and affect. His behavior is normal.   Results for orders placed in visit on 01/04/13  GLUCOSE, POCT (MANUAL RESULT ENTRY)      Result Value Range   POC Glucose 126 (*) 70 - 99 mg/dl  POCT GLYCOSYLATED HEMOGLOBIN (HGB A1C)      Result Value Range   Hemoglobin A1C 6.7         Assessment & Plan:  [name] is a 79 y.o. male .Type II or unspecified type diabetes mellitus without mention of complication, not stated as uncontrolled - Plan: POCT glucose (manual entry), POCT glycosylated hemoglobin (Hb A1C), HM Diabetes Foot Exam, Microalbumin, urine, insulin glargine (LANTUS) 100 UNIT/ML injection, metFORMIN (GLUCOPHAGE) 1000 MG tablet, Basic metabolic panel  Need for prophylactic vaccination and inoculation against influenza - Plan: Flu Vaccine QUAD 36+ mos IM  HTN (hypertension) - Plan: lisinopril-hydrochlorothiazide (PRINZIDE,ZESTORETIC) 20-25 MG per tablet, lisinopril (PRINIVIL,ZESTRIL) 20 MG tablet, metoprolol (LOPRESSOR) 50 MG tablet, Basic metabolic panel  Other and unspecified hyperlipidemia - Plan: atorvastatin (LIPITOR) 10 MG tablet  Need for prophylactic vaccination with Streptococcus  pneumoniae (Pneumococcus) and Influenza vaccines - Plan: Pneumococcal polysaccharide vaccine 23-valent greater than or equal to 2yo subcutaneous/IM  DM2 - controlled. continue Lantus 8 units QHS, metformin 1000mg  bid. Resources given for dentist, and optho visit recommended.   HTN - controlled. Continue same dose of lisinopril and  zestoretic.   Hyperlipidemia, hx of CAD, s/p cards eval. Lipids controlled on July labs. Continue Lipitor.   Health maintenance - flu and pneumonia vaccines given.   Meds ordered this encounter  Medications  . insulin glargine (LANTUS) 100 UNIT/ML injection    Sig: Inject 0.08 mLs (8 Units total) into the skin at bedtime.    Dispense:  10 mL    Refill:  3  . metFORMIN (GLUCOPHAGE) 1000 MG tablet    Sig: Take 1 tablet (1,000 mg total) by mouth 2 (two) times daily with a meal.    Dispense:  180 tablet    Refill:  0  . lisinopril-hydrochlorothiazide (PRINZIDE,ZESTORETIC) 20-25 MG per tablet    Sig: TAKE 1 TABLET BY MOUTH EVERY DAY    Dispense:  90 tablet    Refill:  1  . lisinopril (PRINIVIL,ZESTRIL) 20 MG tablet    Sig: TAKE 1 TABLET (20 MG TOTAL) BY MOUTH DAILY.    Dispense:  90 tablet    Refill:  1  . atorvastatin (LIPITOR) 10 MG tablet    Sig: Take 1 tablet (10 mg total) by mouth daily.    Dispense:  90 tablet    Refill:  1  . metoprolol (LOPRESSOR) 50 MG tablet    Sig: Take 1 tablet (50 mg total) by mouth 2 (two) times daily. Needs office visit    Dispense:  180 tablet    Refill:  1   Patient Instructions  Diabetes is doing well - keep up the good work. Try to schedule eye doctor and dentist visit. No changes in medications for now.  Return to the clinic or go to the nearest emergency room if any of your symptoms worsen or new symptoms occur, including any bleeding.  You should receive a call or letter about your lab results within the next week to 10 days.   Phone number for dentists:  [facility] [phone] ext: [phone]  [facility] [phone]

## 2013-01-05 LAB — MICROALBUMIN, URINE: Microalb, Ur: 4.57 mg/dL — ABNORMAL HIGH (ref 0.00–1.89)

## 2013-01-29 ENCOUNTER — Other Ambulatory Visit: Payer: Self-pay | Admitting: Family Medicine

## 2013-04-12 ENCOUNTER — Encounter: Payer: Self-pay | Admitting: Family Medicine

## 2013-04-12 ENCOUNTER — Other Ambulatory Visit: Payer: Self-pay | Admitting: Family Medicine

## 2013-04-12 ENCOUNTER — Ambulatory Visit (INDEPENDENT_AMBULATORY_CARE_PROVIDER_SITE_OTHER): Payer: Medicare HMO | Admitting: Family Medicine

## 2013-04-12 VITALS — BP 132/74 | HR 79 | Temp 99.1°F | Resp 16 | Ht 69.5 in | Wt 209.0 lb

## 2013-04-12 DIAGNOSIS — H6980 Other specified disorders of Eustachian tube, unspecified ear: Secondary | ICD-10-CM

## 2013-04-12 DIAGNOSIS — E785 Hyperlipidemia, unspecified: Secondary | ICD-10-CM

## 2013-04-12 DIAGNOSIS — R0989 Other specified symptoms and signs involving the circulatory and respiratory systems: Secondary | ICD-10-CM

## 2013-04-12 DIAGNOSIS — H93A2 Pulsatile tinnitus, left ear: Secondary | ICD-10-CM

## 2013-04-12 DIAGNOSIS — H9319 Tinnitus, unspecified ear: Secondary | ICD-10-CM

## 2013-04-12 DIAGNOSIS — I10 Essential (primary) hypertension: Secondary | ICD-10-CM

## 2013-04-12 DIAGNOSIS — H6982 Other specified disorders of Eustachian tube, left ear: Secondary | ICD-10-CM

## 2013-04-12 DIAGNOSIS — E119 Type 2 diabetes mellitus without complications: Secondary | ICD-10-CM

## 2013-04-12 LAB — COMPLETE METABOLIC PANEL WITH GFR
ALT: 10 U/L (ref 0–53)
AST: 14 U/L (ref 0–37)
Albumin: 4.2 g/dL (ref 3.5–5.2)
Alkaline Phosphatase: 40 U/L (ref 39–117)
BUN: 16 mg/dL (ref 6–23)
CO2: 26 mEq/L (ref 19–32)
Calcium: 9.3 mg/dL (ref 8.4–10.5)
Chloride: 103 mEq/L (ref 96–112)
Creat: 1.25 mg/dL (ref 0.50–1.35)
GFR, Est African American: 64 mL/min
GFR, Est Non African American: 55 mL/min — ABNORMAL LOW
Glucose, Bld: 100 mg/dL — ABNORMAL HIGH (ref 70–99)
Potassium: 4.5 mEq/L (ref 3.5–5.3)
Sodium: 137 mEq/L (ref 135–145)
Total Bilirubin: 0.5 mg/dL (ref 0.3–1.2)
Total Protein: 7 g/dL (ref 6.0–8.3)

## 2013-04-12 LAB — POCT GLYCOSYLATED HEMOGLOBIN (HGB A1C): Hemoglobin A1C: 6.8

## 2013-04-12 LAB — LIPID PANEL
Cholesterol: 108 mg/dL (ref 0–200)
HDL: 37 mg/dL — ABNORMAL LOW (ref >39)
LDL Cholesterol: 43 mg/dL (ref 0–99)
Total CHOL/HDL Ratio: 2.9 Ratio
Triglycerides: 142 mg/dL (ref <150)
VLDL: 28 mg/dL (ref 0–40)

## 2013-04-12 LAB — GLUCOSE, POCT (MANUAL RESULT ENTRY): POC Glucose: 123 mg/dl — AB (ref 70–99)

## 2013-04-12 NOTE — Patient Instructions (Signed)
Start claritin - one over the counter each day for possible allergies causing your ear symptoms, and we will schedule the scan of your neck arteries, but recheck in next 2 weeks to discuss this further. Return to the clinic or go to the nearest emergency room if any of your symptoms worsen or new symptoms occur. No change in other medicines for now.  Can wait to recheck cholesterol at your heart doctor in June or July.   Barotitis Media Barotitis media is soreness (inflammation) of the area behind the eardrum (middle ear). This occurs when the auditory tube (Eustachian tube) leading from the back of the throat to the eardrum is blocked. When it is blocked air cannot move in and out of the middle ear to equalize pressure changes. These pressure changes come from changes in altitude when:  Flying.  Driving in the mountains.  Diving. Problems are more likely to occur with pressure changes during times when you are congested as from:  Hay fever.  Upper respiratory infection.  A cold. Damage or hearing loss (barotrauma) caused by this may be permanent. HOME CARE INSTRUCTIONS   Use medicines as recommended by your caregiver. Over the counter medicines will help unblock the canal and can help during times of air travel.  Do not put anything into your ears to clean or unplug them. Eardrops will not be helpful.  Do not swim, dive, or fly until your caregiver says it is all right to do so. If these activities are necessary, chewing gum with frequent swallowing may help. It is also helpful to hold your nose and gently blow to pop your ears for equalizing pressure changes. This forces air into the Eustachian tube.  For little ones with problems, give your baby a bottle of water or juice during periods when pressure changes would be anticipated such as during take offs and landings associated with air travel.  Only take over-the-counter or prescription medicines for pain, discomfort, or fever as  directed by your caregiver.  A decongestant may be helpful in de-congesting the middle ear and make pressure equalization easier. This can be even more effective if the drops (spray) are delivered with the head lying over the edge of a bed with the head tilted toward the ear on the affected side.  If your caregiver has given you a follow-up appointment, it is very important to keep that appointment. Not keeping the appointment could result in a chronic or permanent injury, pain, hearing loss and disability. If there is any problem keeping the appointment, you must call back to this facility for assistance. SEEK IMMEDIATE MEDICAL CARE IF:   You develop a severe headache, dizziness, severe ear pain, or bloody or pus-like drainage from your ears.  An oral temperature above 102 F (38.9 C) develops.  Your problems do not improve or become worse. MAKE SURE YOU:   Understand these instructions.  Will watch your condition.  Will get help right away if you are not doing well or get worse. Document Released: 03/22/2000 Document Revised: 06/17/2011 Document Reviewed: 10/20/2012 [facility] Patient Information 2014 [place], [place]. Tinnitus Sounds you hear in your ears and coming from within the ear is called tinnitus. This can be a symptom of many ear disorders. It is often associated with hearing loss.  Tinnitus can be seen with:  Infections.  Ear blockages such as wax buildup.  Meniere's disease.  Ear damage.  Inherited.  Occupational causes. While irritating, it is not usually a threat to health. When  the cause of the tinnitus is wax, infection in the middle ear, or foreign body it is easily treated. Hearing loss will usually be reversible.  TREATMENT  When treating the underlying cause does not get rid of tinnitus, it may be necessary to get rid of the unwanted sound by covering it up with more pleasant background noises. This may include music, the radio etc. There are tinnitus maskers  which can be worn which produce background noise to cover up the tinnitus. Avoid all medications which tend to make tinnitus worse such as alcohol, caffeine, aspirin, and nicotine. There are many soothing background tapes such as rain, ocean, thunderstorms, etc. These soothing sounds help with sleeping or resting. Keep all follow-up appointments and referrals. This is important to identify the cause of the problem. It also helps avoid complications, impaired hearing, disability, or chronic pain. Document Released: 03/25/2005 Document Revised: 06/17/2011 Document Reviewed: 11/11/2007 [facility] Patient Information 2014 [place].

## 2013-04-12 NOTE — Progress Notes (Signed)
Subjective:    Patient ID: [name], male    DOB: 11-06-1933, 80 y.o.   MRN: [id number]  HPI [name] is a 80 y.o. male hx mult medical problems including prostate CA s/p radiation and lupron.  HX CAD s/p PTCA, DM2, HTN.  DM2 - last A1c 6.7 on 01/04/13. Continued on metformin 1000mg  BID and lantus 8 units qd.   Lab Results  Component Value Date   HGBA1C 6.7 01/04/2013  home blood sugars - 98-105, fasting 90-98. No symptomatic lows. No new side effects of DM meds. Has not yet seen dentist d/t cost, but new insurance now. Has numbers if needed. Plans to schedule eye doctor visit as well. - last seen about a year and half ago.   Hyperlipidemia, hx of CAD, s/p cards eval in July 2014 - Dr [name]. Taking atorvastatin 10mg  qhs. No new side effects/myalgias,abd pain. Next cards appt in July. Had [place] with Dr. [name] that was ok in July last year: Lab Results  Component Value Date   CHOL 114 10/13/2012   HDL 37* 10/13/2012   LDLCALC 41 10/13/2012   TRIG 178* 10/13/2012   CHOLHDL 3.1 10/13/2012   Hx of prostate CA - followed by Dr. [name], next appt. in February. No new urinary sx's, no hematuria. Prior on Proscar, discontinued last year.   Occasional popping in L ear past few weeks and also occasional heartbeat sound, ringing in R ear for years. No dizziness/lightheadedness. No headaches. Occasional sinus congestion, sneezing on occasion. Tx: otc [facility]- unknown type.   Review of Systems  Constitutional: Negative for fatigue and unexpected weight change.  Eyes: Negative for visual disturbance.  Respiratory: Negative for cough, chest tightness and shortness of breath.   Cardiovascular: Negative for chest pain, palpitations and leg swelling.  Gastrointestinal: Negative for abdominal pain and blood in stool.  Neurological: Negative for dizziness, light-headedness and headaches.       Objective:   Physical Exam  Vitals reviewed. Constitutional: He is oriented to person, place, and time.  He appears well-developed and well-nourished.  HENT:  Head: Normocephalic and atraumatic.  Right Ear: Hearing normal.  Left Ear: Hearing normal.  Mouth/Throat: Oropharynx is clear and moist.  Min clear fluid behind L >R. No erythema/retraction and canals clear.   Eyes: Pupils are equal, round, and reactive to light.  Neck: Trachea normal. Carotid bruit is present (faint bruit on L side. ).  Cardiovascular: Normal rate, regular rhythm, normal heart sounds and intact distal pulses.   Pulmonary/Chest: Effort normal and breath sounds normal.  Abdominal: Soft. There is no tenderness.  Neurological: He is alert and oriented to person, place, and time.  Microfilament testing of feet normal bilaterally.  Skin: Skin is warm, dry and intact. No rash noted.  Psychiatric: He has a normal mood and affect. His behavior is normal.    Results for orders placed in visit on 04/12/13  GLUCOSE, POCT (MANUAL RESULT ENTRY)      Result Value Range   POC Glucose 123 (*) 70 - 99 mg/dl  POCT GLYCOSYLATED HEMOGLOBIN (HGB A1C)      Result Value Range   Hemoglobin A1C 6.8     Filed Vitals:   04/12/13 1355  BP: 132/74  Pulse: 79  Temp: 99.1 F (37.3 C)  TempSrc: Oral  Resp: 16  Height: 5' 9.5" (1.765 m)  Weight: 209 lb (94.802 kg)  SpO2: 100%      Assessment & Plan:  [name] is a  80 y.o. male Type II or unspecified type diabetes mellitus without mention of complication, not stated as uncontrolled - Plan: POCT glucose (manual entry), POCT glycosylated hemoglobin (Hb A1C)  Controlled.  Continue same doses of meds and hypoglycemic precautions discussed. Recommended optho and dentist evals as prior.   Hyperlipidemia - Plan: Lipid panel. Cont lipitor.   Essential hypertension, benign - Plan: COMPLETE METABOLIC PANEL WITH GFR pending. Controlled, no change in meds.   Left carotid bruit - Plan: US Carotid Duplex Bilateral, with intermittent sensation of pulsatile tinnitus of left ear - Plan: US  Carotid Duplex Bilateral, may have component of ETD (eustachian tube dysfunction), left - trial of claritin and recheck in 2 weeks. Consider audiometry for more typical tinnitus sx's in R ear.    No orders of the defined types were placed in this encounter.   Patient Instructions  Start claritin - one over the counter each day for possible allergies causing your ear symptoms, and we will schedule the scan of your neck arteries, but recheck in next 2 weeks to discuss this further. Return to the clinic or go to the nearest emergency room if any of your symptoms worsen or new symptoms occur. No change in other medicines for now.  Can wait to recheck cholesterol at your heart doctor in June or July.   Barotitis Media Barotitis media is soreness (inflammation) of the area behind the eardrum (middle ear). This occurs when the auditory tube (Eustachian tube) leading from the back of the throat to the eardrum is blocked. When it is blocked air cannot move in and out of the middle ear to equalize pressure changes. These pressure changes come from changes in altitude when:  Flying.  Driving in the mountains.  Diving. Problems are more likely to occur with pressure changes during times when you are congested as from:  Hay fever.  Upper respiratory infection.  A cold. Damage or hearing loss (barotrauma) caused by this may be permanent. HOME CARE INSTRUCTIONS   Use medicines as recommended by your caregiver. Over the counter medicines will help unblock the canal and can help during times of air travel.  Do not put anything into your ears to clean or unplug them. Eardrops will not be helpful.  Do not swim, dive, or fly until your caregiver says it is all right to do so. If these activities are necessary, chewing gum with frequent swallowing may help. It is also helpful to hold your nose and gently blow to pop your ears for equalizing pressure changes. This forces air into the Eustachian tube.  For  little ones with problems, give your baby a bottle of water or juice during periods when pressure changes would be anticipated such as during take offs and landings associated with air travel.  Only take over-the-counter or prescription medicines for pain, discomfort, or fever as directed by your caregiver.  A decongestant may be helpful in de-congesting the middle ear and make pressure equalization easier. This can be even more effective if the drops (spray) are delivered with the head lying over the edge of a bed with the head tilted toward the ear on the affected side.  If your caregiver has given you a follow-up appointment, it is very important to keep that appointment. Not keeping the appointment could result in a chronic or permanent injury, pain, hearing loss and disability. If there is any problem keeping the appointment, you must call back to this facility for assistance. SEEK IMMEDIATE MEDICAL CARE IF:  You develop a severe headache, dizziness, severe ear pain, or bloody or pus-like drainage from your ears.  An oral temperature above 102 F (38.9 C) develops.  Your problems do not improve or become worse. MAKE SURE YOU:   Understand these instructions.  Will watch your condition.  Will get help right away if you are not doing well or get worse. Document Released: 03/22/2000 Document Revised: 06/17/2011 Document Reviewed: 10/20/2012 [facility] Patient Information 2014 [place], [place]. Tinnitus Sounds you hear in your ears and coming from within the ear is called tinnitus. This can be a symptom of many ear disorders. It is often associated with hearing loss.  Tinnitus can be seen with:  Infections.  Ear blockages such as wax buildup.  Meniere's disease.  Ear damage.  Inherited.  Occupational causes. While irritating, it is not usually a threat to health. When the cause of the tinnitus is wax, infection in the middle ear, or foreign body it is easily treated. Hearing  loss will usually be reversible.  TREATMENT  When treating the underlying cause does not get rid of tinnitus, it may be necessary to get rid of the unwanted sound by covering it up with more pleasant background noises. This may include music, the radio etc. There are tinnitus maskers which can be worn which produce background noise to cover up the tinnitus. Avoid all medications which tend to make tinnitus worse such as alcohol, caffeine, aspirin, and nicotine. There are many soothing background tapes such as rain, ocean, thunderstorms, etc. These soothing sounds help with sleeping or resting. Keep all follow-up appointments and referrals. This is important to identify the cause of the problem. It also helps avoid complications, impaired hearing, disability, or chronic pain. Document Released: 03/25/2005 Document Revised: 06/17/2011 Document Reviewed: 11/11/2007 [facility] Patient Information 2014 [place].

## 2013-04-29 ENCOUNTER — Other Ambulatory Visit: Payer: Self-pay | Admitting: Family Medicine

## 2013-05-03 ENCOUNTER — Ambulatory Visit (INDEPENDENT_AMBULATORY_CARE_PROVIDER_SITE_OTHER): Payer: Medicare HMO | Admitting: Family Medicine

## 2013-05-03 ENCOUNTER — Encounter: Payer: Self-pay | Admitting: Family Medicine

## 2013-05-03 VITALS — BP 128/88 | HR 74 | Temp 97.9°F | Resp 16 | Ht 69.0 in | Wt 208.0 lb

## 2013-05-03 DIAGNOSIS — H6980 Other specified disorders of Eustachian tube, unspecified ear: Secondary | ICD-10-CM

## 2013-05-03 DIAGNOSIS — H9311 Tinnitus, right ear: Secondary | ICD-10-CM

## 2013-05-03 DIAGNOSIS — H93A2 Pulsatile tinnitus, left ear: Secondary | ICD-10-CM

## 2013-05-03 DIAGNOSIS — E119 Type 2 diabetes mellitus without complications: Secondary | ICD-10-CM

## 2013-05-03 DIAGNOSIS — H9319 Tinnitus, unspecified ear: Secondary | ICD-10-CM

## 2013-05-03 NOTE — Patient Instructions (Addendum)
You can go for your carotid US on Tues Feb 3rd arrive at [place]. We will make sure the authorization is done through your [employer] (they require authorization for ALL your referrals/ appointments).  We may need to still refer you to ear nose and throat for these symptoms, but can wait until results of ultrasound for this.   recheck in 2 months. We can recheck kidney test then. Make sure you are staying well hydrated.

## 2013-05-03 NOTE — Progress Notes (Signed)
Subjective:    Patient ID: [name], male    DOB: [birth year], 80 y.o.   MRN: [id number]  HPI [name] is a 80 y.o. male Hx mult medical problems including prostate CA s/p radiation and lupron.  HX CAD s/p PTCA, DM2, HTN. Last seen few weeks ago - here for follow up.   DM2 - last A1c 6.7 on 01/04/13. Continued on metformin 1077m BID and lantus 8 units qd.  Lab Results  Component Value Date   HGBA1C 6.8 04/12/2013  continued same regimen. Advised to schedule optho and dentist visits.   Borderline/elevated creatinine at last ov - 1.21 to 1.25 last ov, eGFR 64.  Has increased fluid intake.  Lytes from last ov as below.    Chemistry      Component Value Date/Time   NA 137 04/12/2013 1415   NA 131* 11/17/2010   K 4.5 04/12/2013 1415   CL 103 04/12/2013 1415   CO2 26 04/12/2013 1415   BUN 16 04/12/2013 1415   CREATININE 1.25 04/12/2013 1415   CREATININE 0.92 07/13/2009 1847   GLU 150 03/08/2011      Component Value Date/Time   CALCIUM 9.3 04/12/2013 1415   ALKPHOS 40 04/12/2013 1415   AST 14 04/12/2013 1415   ALT 10 04/12/2013 1415   BILITOT 0.5 04/12/2013 1415     Noted at last office visit occasional popping in L ear since mid December, but also occasional heartbeat sound and ringing in R ear for years. No dizziness/lightheadedness. No headaches. Occasional sinus congestion, sneezing on occasion.  Noted left carotid bruit. Planned on carotid dopplers. Recommended claritin to see if component of ETD/AR, and here for follow up.  Tried claritin for past 2 weeks - min change in L ear popping, maybe a little less popping in L ear, still feels heartbeat in L ear - every now and then after popping stops - lasts for about an hour, then stops. Notices most days.  Still with buzzing in R ear - present for years, no recent change in R ear symptoms. Feel like hearing ok. Doppler scheduled next week - working on pTeaching laboratory technicianwith insurance.   Patient Active Problem List   Diagnosis Date Noted  .  Rectal bleeding 12/23/2012  . Type II or unspecified type diabetes mellitus without mention of complication, not stated as uncontrolled   . Essential hypertension, benign   . Chronic back pain   . ED (erectile dysfunction)   . Anemia   . Hyperlipidemia   . CAD, NATIVE VESSEL 05/08/2008  . VENTRICULAR TACHYCARDIA 05/08/2008  . PREMATURE VENTRICULAR CONTRACTIONS 05/08/2008   Past Medical History  Diagnosis Date  . Type II or unspecified type diabetes mellitus without mention of complication, not stated as uncontrolled   . Essential hypertension, benign   . Chronic back pain   . ED (erectile dysfunction)   . Prostate cancer     s/p  . CAD (coronary artery disease)     pci to LAD 10/09; stable CAD by cath 05/31/08; myoview 04/11/11- no ishcemia, EF 67%; echo 05/15/09- EF>55%, mod calcification of the aortic valve leaflets  . Ventricular tachycardia     resuscitated; monitor 05/2008; attempted T ablation 2/10- aborted due to inappropriate substrate  . Hyperlipidemia   . Allergy    Past Surgical History  Procedure Laterality Date  . Ptca  01/2008  . Colectomy  '80's    polyps  . Coronary stent placement  01/19/08    PCI  stent to mid LAD with Promus DES 27.5x28  . Cardiac catheterization  05/31/08    EF 55-60%, stable lesions, medical therapy   Allergies  Allergen Reactions  . Niacin And Related     flushing   Prior to Admission medications   Medication Sig Start Date End Date Taking? Authorizing Provider  atorvastatin (LIPITOR) 10 MG tablet Take 1 tablet (10 mg total) by mouth daily. 01/04/13  Yes [name], MD  Calcium Carb-Cholecalciferol (CALCIUM 1000 + D PO) Take by mouth.   Yes Historical Provider, MD  fish oil-omega-3 fatty acids 1000 MG capsule Take 1 g by mouth daily.   Yes Historical Provider, MD  glucose blood (TRUETEST TEST) test strip USE AS DIRECTED THREE TIMES DAILY 07/06/12  Yes [name], MD  insulin glargine (LANTUS) 100 UNIT/ML injection Inject 0.08 mLs  (8 Units total) into the skin at bedtime. 01/04/13  Yes [name], MD  Insulin Syringe-Needle U-100 (B-D INS SYR HALF-UNIT .3CC/31G) 31G X 5/16" 0.3 ML MISC Use daily at bedtime to inject insulin. Dx code: 250.00 04/29/13  Yes [name], MD  lisinopril (PRINIVIL,ZESTRIL) 20 MG tablet TAKE 1 TABLET (20 MG TOTAL) BY MOUTH DAILY. 01/04/13  Yes [name], MD  lisinopril-hydrochlorothiazide (PRINZIDE,ZESTORETIC) 20-25 MG per tablet TAKE 1 TABLET BY MOUTH EVERY DAY 01/04/13  Yes [name], MD  metFORMIN (GLUCOPHAGE) 1000 MG tablet TAKE 1 TABLET (1,000 MG TOTAL) BY MOUTH 2 (TWO) TIMES DAILY WITH A MEAL. 04/12/13  Yes Eleanore E Elana Alm, PA-C  metoprolol (LOPRESSOR) 50 MG tablet Take 1 tablet (50 mg total) by mouth 2 (two) times daily. Needs office visit 01/04/13  Yes [name], MD  Multiple Vitamin (MULTIVITAMIN) tablet Take 1 tablet by mouth daily.   Yes Historical Provider, MD  Vitamin D, Cholecalciferol, 1000 UNITS TABS Take by mouth daily.   Yes Historical Provider, MD  finasteride (PROSCAR) 5 MG tablet Take 5 mg by mouth daily.    Historical Provider, MD   History   Social History  . Marital Status: Legally Separated    Spouse Name: [name]    Number of Children: 7  . Years of Education: [name]   Occupational History  . RETIRED    Social History Main Topics  . Smoking status: Former Smoker -- 0.50 packs/day for 15 years    Types: Cigarettes    Quit date: 09/01/1990  . Smokeless tobacco: Not on file     Comment: per pt-quit about 29 yrs ago  . Alcohol Use: No  . Drug Use: No  . Sexual Activity: No   Other Topics Concern  . Not on file   Social History Narrative   29 grandchildren. Single. Education: 12 th grade. Exercise: 3-4 times a week for 30 minutes working out and set ups.     Review of Systems  Constitutional: Negative for fever and chills.  HENT: Positive for tinnitus. Negative for ear discharge, ear pain, facial swelling and hearing loss.     Respiratory: Negative for chest tightness.   Neurological: Negative for dizziness and light-headedness.       Objective:   Physical Exam  Vitals reviewed. Constitutional: He is oriented to person, place, and time. He appears well-developed and well-nourished.  HENT:  Head: Normocephalic and atraumatic.  Right Ear: Tympanic membrane, external ear and ear canal normal.  Left Ear: Tympanic membrane, external ear and ear canal normal.  Grossly hearing normal with speech, no mastoid ttp.   Eyes: EOM  are normal. Pupils are equal, round, and reactive to light.  Neck: No JVD present. Carotid bruit is not present (faint L carotid bruit. ).  Cardiovascular: Normal rate, regular rhythm and normal heart sounds.   No murmur heard. Pulmonary/Chest: Effort normal and breath sounds normal. He has no rales.  Musculoskeletal: He exhibits no edema.  Neurological: He is alert and oriented to person, place, and time.  Skin: Skin is warm and dry.  Psychiatric: He has a normal mood and affect.   Filed Vitals:   05/03/13 1425  BP: 128/88  Pulse: 74  Temp: 97.9 F (36.6 C)  TempSrc: Oral  Resp: 16  Height: 5' 9"  (1.753 m)  Weight: 208 lb (94.348 kg)  SpO2: 99%      Assessment & Plan:   KIMBERLY COYE is a 80 y.o. 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  Discussed need for further eval of pulsatile nature of tinnitus, and probable ENT eval, but would like to start with carotid eval. Doppler pending. Ok to cont claritin for ETD component.   DM2 (diabetes mellitus, type 2) - controlled. See last ov, no changes. Overall stable creat/GFR - recheck level at next DM visit.    Meds ordered this encounter  Medications  . Vitamin D, Cholecalciferol, 1000 UNITS TABS    Sig: Take by  mouth daily.   Patient Instructions  You can go for your carotid US on Tues Feb 3rd arrive at [facility]. We will make sure the authorization is done through your [employer] (they require authorization for ALL your referrals/ appointments).  We may need to still refer you to ear nose and throat for these symptoms, but can wait until results of ultrasound for this.   recheck in 2 months. We can recheck kidney test then. Make sure you are staying well hydrated.

## 2013-05-10 ENCOUNTER — Telehealth: Payer: Self-pay

## 2013-05-10 NOTE — Telephone Encounter (Signed)
[name] w/[facility] called stating the pts referral form for [facility] was incomplete and incorrect. States it was missing diagnosis codes and specialist name.  It also had referral selected instead of precert for imaging. Referral is for a carotid ultrasound.   Per Betty's request, I filled out a new form with codes (diagnosis and cpt) and faxed back to attn: Wade at [phone]. Pts ultrasound is 05/11/13 @ 1045am. They will try to expedite so pt can keep appt.  This note is for documentation. i could not locate a place in pts chart where ultrasound existed to make notes.   bf

## 2013-05-11 ENCOUNTER — Inpatient Hospital Stay: Admission: RE | Admit: 2013-05-11 | Payer: Medicare Other | Source: Ambulatory Visit

## 2013-06-21 ENCOUNTER — Ambulatory Visit (INDEPENDENT_AMBULATORY_CARE_PROVIDER_SITE_OTHER): Payer: Medicare HMO | Admitting: Family Medicine

## 2013-06-21 ENCOUNTER — Encounter: Payer: Self-pay | Admitting: Family Medicine

## 2013-06-21 VITALS — BP 125/68 | HR 71 | Temp 97.8°F | Resp 16 | Ht 69.0 in | Wt 210.6 lb

## 2013-06-21 DIAGNOSIS — I10 Essential (primary) hypertension: Secondary | ICD-10-CM

## 2013-06-21 DIAGNOSIS — E119 Type 2 diabetes mellitus without complications: Secondary | ICD-10-CM

## 2013-06-21 DIAGNOSIS — E785 Hyperlipidemia, unspecified: Secondary | ICD-10-CM

## 2013-06-21 MED ORDER — LISINOPRIL-HYDROCHLOROTHIAZIDE 20-25 MG PO TABS: ORAL_TABLET | ORAL | Status: DC

## 2013-06-21 MED ORDER — "INSULIN SYRINGE-NEEDLE U-100 31G X 5/16"" 0.3 ML MISC": Status: AC

## 2013-06-21 MED ORDER — ATORVASTATIN CALCIUM 10 MG PO TABS: 10.0000 mg | ORAL_TABLET | Freq: Every day | ORAL | Status: DC

## 2013-06-21 MED ORDER — INSULIN GLARGINE 100 UNIT/ML ~~LOC~~ SOLN: 8.0000 [IU] | Freq: Every day | SUBCUTANEOUS | Status: DC

## 2013-06-21 MED ORDER — METFORMIN HCL 1000 MG PO TABS: ORAL_TABLET | ORAL | Status: DC

## 2013-06-21 MED ORDER — LISINOPRIL 20 MG PO TABS: ORAL_TABLET | ORAL | Status: DC

## 2013-06-21 MED ORDER — GLUCOSE BLOOD VI STRP: ORAL_STRIP | Status: DC

## 2013-06-21 MED ORDER — METOPROLOL TARTRATE 50 MG PO TABS: 50.0000 mg | ORAL_TABLET | Freq: Two times a day (BID) | ORAL | Status: DC

## 2013-06-21 NOTE — Patient Instructions (Signed)
Recheck in 3 months, no med changes for now.  If noise in left ear (heartbeat sound) returns - I would recommend we refer you to [facility] doctor for evaluation. Return to the clinic or go to the nearest emergency room if any of your symptoms worsen or new symptoms occur.

## 2013-06-21 NOTE — Progress Notes (Signed)
Subjective:    Patient ID: [name], male    DOB: 1933-11-17, 80 y.o.   MRN: [id number]  HPI This chart was scribed for [name] by Celesta Gentile, Scribe. This patient was seen in room 23 and the patient's care was started at 3:34 PM.  HPI Comments: [name] is a 80 y.o. male who presents to the [facility] for a 3 month diabetes follow up.  Pt was last seen on 05/03/13.    1. Diabetes type II.   Hgb A1C was 6.8 on 04/12/13.  Continued Metfromin 1000 mg BID and Lantus 8 units mLs.  Pt states he has been checking his blood sugars at home.  Reports they run around 110 and 120.    2. Hyperlipidemia  Total Cholesterol was 108, HDL was 37, and LDL was 43 from 01/15.  Pt is taking Lipitor without new side effects.   3. Pulsatile tinnitus. Discussed in January.  Buzzing in right ear for years, but had pulsatile nature to left ear symptoms.  Recommended carotid dopplers.  Per chart was no show for this on Feb 3rd.  He states he didn't know about the ultrasound appointment.  He states it sounds like a frog, but states the sound is slowing down, and is not there all the time 0- comes and goes and less frequent.   4. H/o of prostrate cancer fallowed by Dr. [name].  Status post radiation,  uapron.    5. H/o of CAD. Status post PTCA.   Pt states he saw his cardiologist about 6 months ago.  Dr. [name] informed pt his cholesterol was normal.    6. HTN.  Pt denies any missed medications.  Pt denies any side effects with his medications.  Pt denies persistent cough or chest pain.     Past Surgical History  Procedure Laterality Date  . Ptca  01/2008  . Colectomy  '80's    polyps  . Coronary stent placement  01/19/08    PCI stent to mid LAD with Promus DES 27.5x28  . Cardiac catheterization  05/31/08    EF 55-60%, stable lesions, medical therapy    Family History  Problem Relation Age of Onset  . Leukemia Father   . Heart disease Sister   . Heart disease  Brother   . Diabetes Brother     pancreatic cancer  . Diabetes Mother   . Heart attack Mother     History   Social History  . Marital Status: Legally Separated    Spouse Name: [name]    Number of Children: 7  . Years of Education: [name]   Occupational History  . RETIRED    Social History Main Topics  . Smoking status: Former Smoker -- 0.50 packs/day for 15 years    Types: Cigarettes    Quit date: 09/01/1990  . Smokeless tobacco: Not on file     Comment: per pt-quit about 29 yrs ago  . Alcohol Use: No  . Drug Use: No  . Sexual Activity: No   Other Topics Concern  . Not on file   Social History Narrative   29 grandchildren. Single. Education: 12 th grade. Exercise: 3-4 times a week for 30 minutes working out and set ups.   Allergies  Allergen Reactions  . Niacin And Related     flushing    Patient Active Problem List   Diagnosis Date Noted  . Rectal bleeding 12/23/2012  . Type II or unspecified  type diabetes mellitus without mention of complication, not stated as uncontrolled   . Essential hypertension, benign   . Chronic back pain   . ED (erectile dysfunction)   . Anemia   . Hyperlipidemia   . CAD, NATIVE VESSEL 05/08/2008  . VENTRICULAR TACHYCARDIA 05/08/2008  . PREMATURE VENTRICULAR CONTRACTIONS 05/08/2008    Review of Systems  Constitutional: Negative for fever and chills.  HENT: Positive for tinnitus. Negative for congestion, ear pain and sore throat.   Respiratory: Negative for cough and shortness of breath.   Cardiovascular: Negative for chest pain and palpitations.  Gastrointestinal: Negative for nausea, vomiting, abdominal pain and diarrhea.  Skin: Negative for rash.  Psychiatric/Behavioral: Negative for behavioral problems and confusion.      Objective:   Physical Exam  Nursing note and vitals reviewed. Constitutional: He is oriented to person, place, and time. He appears well-developed and well-nourished. No distress.  HENT:  Head:  Normocephalic and atraumatic.  Right Ear: External ear and ear canal normal.  Left Ear: External ear and ear canal normal.  TM's pearly gray.    Eyes: Conjunctivae and EOM are normal. Right eye exhibits no discharge. Left eye exhibits no discharge.  Neck: Trachea normal and normal range of motion. Neck supple. Carotid bruit is not present. No tracheal deviation present.  Cardiovascular: Normal rate, regular rhythm and normal heart sounds.   No murmur heard. Occasional ectopic beat.   Pulmonary/Chest: Effort normal and breath sounds normal. No respiratory distress. He has no wheezes. He has no rales. He exhibits no tenderness.  Abdominal: Soft. He exhibits no distension. There is no tenderness.  Musculoskeletal: Normal range of motion.  Neurological: He is alert and oriented to person, place, and time.  Skin: Skin is warm and dry. No rash noted.  Psychiatric: He has a normal mood and affect. His behavior is normal. Judgment and thought content normal.   Filed Vitals:   06/21/13 1441  BP: 125/68  Pulse: 71  Temp: 97.8 F (36.6 C)  TempSrc: Oral  Resp: 16  Height: 5\' 9"  (1.753 m)  Weight: 210 lb 9.6 oz (95.528 kg)  SpO2: 99%      Assessment & Plan:   SABASTION Acosta is a 80 y.o. male DM II (diabetes mellitus, type II), controlled - Plan: glucose blood (TRUETEST TEST) test strip, insulin glargine (LANTUS) 100 UNIT/ML injection, metFORMIN (GLUCOPHAGE) 1000 MG tablet, Insulin Syringe-Needle U-100 (B-D INS SYR HALF-UNIT .3CC/31G) 31G X 5/16" 0.3 ML MISC  -prior controlled, home blood sugars ok, too early for A1c. Continue same regimen, recheck in 3 months for A1C.   HTN (hypertension) - Plan: metoprolol (LOPRESSOR) 50 MG tablet, lisinopril-hydrochlorothiazide (PRINZIDE,ZESTORETIC) 20-25 MG per tablet, lisinopril (PRINIVIL,ZESTRIL) 20 MG tablet  -controlled. Continue follow up with cardiology with hx CAD, PVC's - asymptomatic at present.   Other and unspecified hyperlipidemia - Plan:  atorvastatin (LIPITOR) 10 MG tablet refilled. continue same dose.   Tinnitus - R sided possible age related presbycusis, initially concerned about pulsatile L tinnitus, but lessening sx's now, and only intermittent. If sx's return/increase, would recommend ENT eval.    Meds ordered this encounter  Medications  . glucose blood (TRUETEST TEST) test strip    Sig: USE AS DIRECTED THREE TIMES DAILY    Dispense:  100 each    Refill:  3    DX Code 250.00  . metoprolol (LOPRESSOR) 50 MG tablet    Sig: Take 1 tablet (50 mg total) by mouth 2 (two) times daily.  Dispense:  180 tablet    Refill:  1  . lisinopril-hydrochlorothiazide (PRINZIDE,ZESTORETIC) 20-25 MG per tablet    Sig: TAKE 1 TABLET BY MOUTH EVERY DAY    Dispense:  90 tablet    Refill:  1  . lisinopril (PRINIVIL,ZESTRIL) 20 MG tablet    Sig: TAKE 1 TABLET (20 MG TOTAL) BY MOUTH DAILY.    Dispense:  90 tablet    Refill:  1  . insulin glargine (LANTUS) 100 UNIT/ML injection    Sig: Inject 0.08 mLs (8 Units total) into the skin at bedtime.    Dispense:  10 mL    Refill:  3  . metFORMIN (GLUCOPHAGE) 1000 MG tablet    Sig: TAKE 1 TABLET (1,000 MG TOTAL) BY MOUTH 2 (TWO) TIMES DAILY WITH A MEAL.    Dispense:  180 tablet    Refill:  1  . Insulin Syringe-Needle U-100 (B-D INS SYR HALF-UNIT .3CC/31G) 31G X 5/16" 0.3 ML MISC    Sig: Use daily at bedtime to inject insulin. Dx code: 250.00    Dispense:  100 each    Refill:  3  . atorvastatin (LIPITOR) 10 MG tablet    Sig: Take 1 tablet (10 mg total) by mouth daily.    Dispense:  90 tablet    Refill:  1   Patient Instructions  Recheck in 3 months, no med changes for now.  If noise in left ear (heartbeat sound) returns - I would recommend we refer you to [facility] doctor for evaluation. Return to the clinic or go to the nearest emergency room if any of your symptoms worsen or new symptoms occur.        I personally performed the services described in this  documentation, which was scribed in my presence. The recorded information has been reviewed and considered, and addended by me as needed.

## 2013-06-22 ENCOUNTER — Encounter: Payer: Self-pay | Admitting: Family Medicine

## 2013-07-05 ENCOUNTER — Ambulatory Visit: Payer: Medicare HMO | Admitting: Family Medicine

## 2013-09-27 ENCOUNTER — Encounter: Payer: Self-pay | Admitting: Family Medicine

## 2013-09-27 ENCOUNTER — Ambulatory Visit (INDEPENDENT_AMBULATORY_CARE_PROVIDER_SITE_OTHER): Payer: Medicare HMO | Admitting: Family Medicine

## 2013-09-27 VITALS — BP 142/82 | HR 84 | Temp 98.1°F | Resp 16 | Ht 69.5 in | Wt 211.4 lb

## 2013-09-27 DIAGNOSIS — F17200 Nicotine dependence, unspecified, uncomplicated: Secondary | ICD-10-CM

## 2013-09-27 DIAGNOSIS — E785 Hyperlipidemia, unspecified: Secondary | ICD-10-CM

## 2013-09-27 DIAGNOSIS — I10 Essential (primary) hypertension: Secondary | ICD-10-CM

## 2013-09-27 DIAGNOSIS — E119 Type 2 diabetes mellitus without complications: Secondary | ICD-10-CM

## 2013-09-27 DIAGNOSIS — H9313 Tinnitus, bilateral: Secondary | ICD-10-CM

## 2013-09-27 DIAGNOSIS — H9319 Tinnitus, unspecified ear: Secondary | ICD-10-CM

## 2013-09-27 DIAGNOSIS — Z720 Tobacco use: Secondary | ICD-10-CM

## 2013-09-27 LAB — GLUCOSE, POCT (MANUAL RESULT ENTRY): POC Glucose: 154 mg/dl — AB (ref 70–99)

## 2013-09-27 LAB — POCT GLYCOSYLATED HEMOGLOBIN (HGB A1C): Hemoglobin A1C: 7.2

## 2013-09-27 NOTE — Progress Notes (Signed)
 Subjective:   This chart was scribed for Jeffrey R Greene, MD by Ashley Leger, [facility] Scribe. This patient was seen in room 22 and the patient's care was started 1:49 PM.    Patient ID: [name], male    DOB: 11/21/1933, 80 y.o.   MRN: [id number]  HPI  HPI Comments: [name] is a 80 y.o. Male with a PMHx of DM, hyperlipidemia, and CAD who presents to [facility] for follow up today. Last office visit 06/21/2013  1. DM2: Was not due to A1C at time of last OV 06/2013 Last A1C was 6.8 in 04/2013 Takes Metformin 1000 mg BID and Lantus 8 units QHS Pt states he is checking his blood sugars with readings in low 100's. No symptomatic lows at this time.  2. HTN: Controlled at last office visit.  On Lisinopril/HCTZ 20-25 mg QD. Pt also on 20 mg Lisinopril QD and Metoprolol 50 mg BID. No new side effects.  Renal function OK in January 2015 with EGFR 64, Creatinine 1.25 Pt states he was recently sick while out of town. Reports a chest cold onset 1 week which is now improving with cold medicine with decongestant. Pt states blood pressure has been somewhat high since recent onset of sickness., but controlled prior.   3. Hyperlipidemia: Controlled on January 2015 labs. Pt takes 10 mg Lipitor QD, no new myalgias.   Lab Results  Component Value Date   CHOL 108 04/12/2013   HDL 37* 04/12/2013   LDLCALC 43 04/12/2013   TRIG 142 04/12/2013   CHOLHDL 2.9 04/12/2013    4. Tinnitus: R greater than L but occasional pulsatile L tinnitus Suspected component of presbycusis. Had discussed further work up of pulsatile tinnitus at last visit but lessened to intermittent tinnitus so denied further evaluation.States at this time pt reports ongoing intermittent tinnitus, including pulsatile component on left,  but says this has lessened from last visit.  Pt admits to smoking occasionally onset 3 weeks ago. States he smokes about 3-4 cigarettes daily. Had quit in past.   Restarted to relax at night.   No CP, palpitations, or SOB.  Patient Active Problem List   Diagnosis Date Noted  . Rectal bleeding 12/23/2012  . Type II or unspecified type diabetes mellitus without mention of complication, not stated as uncontrolled   . Essential hypertension, benign   . Chronic back pain   . ED (erectile dysfunction)   . Anemia   . Hyperlipidemia   . CAD, NATIVE VESSEL 05/08/2008  . VENTRICULAR TACHYCARDIA 05/08/2008  . PREMATURE VENTRICULAR CONTRACTIONS 05/08/2008   Past Medical History  Diagnosis Date  . Type II or unspecified type diabetes mellitus without mention of complication, not stated as uncontrolled   . Essential hypertension, benign   . Chronic back pain   . ED (erectile dysfunction)   . Prostate cancer     s/p  . CAD (coronary artery disease)     pci to LAD 10/09; stable CAD by cath 05/31/08; myoview 04/11/11- no ishcemia, EF 67%; echo 05/15/09- EF>55%, mod calcification of the aortic valve leaflets  . Ventricular tachycardia     resuscitated; monitor 05/2008; attempted T ablation 2/10- aborted due to inappropriate substrate  . Hyperlipidemia   . Allergy    Past Surgical History  Procedure Laterality Date  . Ptca  01/2008  . Colectomy  '80's    polyps  . Coronary stent placement  01/19/08    PCI stent   to mid LAD with Promus DES 27.5x28  . Cardiac catheterization  05/31/08    EF 55-60%, stable lesions, medical therapy   Allergies  Allergen Reactions  . Niacin And Related     flushing   Prior to Admission medications   Medication Sig Start Date End Date Taking? Authorizing Provider  atorvastatin (LIPITOR) 10 MG tablet Take 1 tablet (10 mg total) by mouth daily. 06/21/13   Jeffrey R Greene, MD  Calcium Carb-Cholecalciferol (CALCIUM 1000 + D PO) Take by mouth.    Historical Provider, MD  finasteride (PROSCAR) 5 MG tablet Take 5 mg by mouth daily.    Historical Provider, MD  fish oil-omega-3 fatty acids 1000 MG capsule Take 1 g by mouth daily.     Historical Provider, MD  glucose blood (TRUETEST TEST) test strip USE AS DIRECTED THREE TIMES DAILY 06/21/13   Jeffrey R Greene, MD  insulin glargine (LANTUS) 100 UNIT/ML injection Inject 0.08 mLs (8 Units total) into the skin at bedtime. 06/21/13   Jeffrey R Greene, MD  Insulin Syringe-Needle U-100 (B-D INS SYR HALF-UNIT .3CC/31G) 31G X 5/16" 0.3 ML MISC Use daily at bedtime to inject insulin. Dx code: 250.00 06/21/13   Jeffrey R Greene, MD  lisinopril (PRINIVIL,ZESTRIL) 20 MG tablet TAKE 1 TABLET (20 MG TOTAL) BY MOUTH DAILY. 06/21/13   Jeffrey R Greene, MD  lisinopril-hydrochlorothiazide (PRINZIDE,ZESTORETIC) 20-25 MG per tablet TAKE 1 TABLET BY MOUTH EVERY DAY 06/21/13   Jeffrey R Greene, MD  metFORMIN (GLUCOPHAGE) 1000 MG tablet TAKE 1 TABLET (1,000 MG TOTAL) BY MOUTH 2 (TWO) TIMES DAILY WITH A MEAL. 06/21/13   Jeffrey R Greene, MD  metoprolol (LOPRESSOR) 50 MG tablet Take 1 tablet (50 mg total) by mouth 2 (two) times daily. 06/21/13   Jeffrey R Greene, MD  Multiple Vitamin (MULTIVITAMIN) tablet Take 1 tablet by mouth daily.    Historical Provider, MD  Vitamin D, Cholecalciferol, 1000 UNITS TABS Take by mouth daily.    Historical Provider, MD   History   Social History  . Marital Status: Legally Separated    Spouse Name: [name]    Number of Children: 7  . Years of Education: [name]   Occupational History  . RETIRED    Social History Main Topics  . Smoking status: Former Smoker -- 0.50 packs/day for 15 years    Types: Cigarettes    Quit date: 09/01/1990  . Smokeless tobacco: Not on file     Comment: per pt-quit about 29 yrs ago  . Alcohol Use: No  . Drug Use: No  . Sexual Activity: No   Other Topics Concern  . Not on file   Social History Narrative   29 grandchildren. Single. Education: 12 th grade. Exercise: 3-4 times a week for 30 minutes working out and set ups.     Review of Systems  Constitutional: Negative for fatigue and unexpected weight change.  Eyes: Negative for visual  disturbance.  Respiratory: Positive for cough. Negative for chest tightness and shortness of breath.   Cardiovascular: Negative for chest pain, palpitations and leg swelling.  Gastrointestinal: Negative for abdominal pain and blood in stool.  Neurological: Negative for dizziness, light-headedness and headaches.     Objective:  Physical Exam  Vitals reviewed. Constitutional: He is oriented to person, place, and time. He appears well-developed and well-nourished.  HENT:  Head: Normocephalic and atraumatic.  Right Ear: Tympanic membrane, external ear and ear canal normal.  Left Ear: Tympanic membrane, external ear and ear canal normal.  Nose:   No rhinorrhea.  Mouth/Throat: Oropharynx is clear and moist and mucous membranes are normal. No oropharyngeal exudate or posterior oropharyngeal erythema.  Eyes: Conjunctivae and EOM are normal. Pupils are equal, round, and reactive to light.  Neck: Neck supple. No JVD present. Carotid bruit is not present.  Cardiovascular: Normal rate, regular rhythm, normal heart sounds and intact distal pulses.   No murmur heard. Pulmonary/Chest: Effort normal and breath sounds normal. He has no wheezes. He has no rhonchi. He has no rales.  Abdominal: Soft. There is no tenderness.  Musculoskeletal: He exhibits no edema.  Lymphadenopathy:    He has no cervical adenopathy.  Neurological: He is alert and oriented to person, place, and time.  Skin: Skin is warm and dry. No rash noted.  Psychiatric: He has a normal mood and affect. His behavior is normal.      Filed Vitals:   09/27/13 1310 09/27/13 1346  BP: 150/76 142/82  Pulse: 85 84  Temp: 98.1 F (36.7 C)   TempSrc: Oral   Resp: 16   Height: 5' 9.5" (1.765 m)   Weight: 211 lb 6.4 oz (95.89 kg)   SpO2: 98%     Results for orders placed in visit on 09/27/13  GLUCOSE, POCT (MANUAL RESULT ENTRY)      Result Value Ref Range   POC Glucose 154 (*) 70 - 99 mg/dl  POCT GLYCOSYLATED HEMOGLOBIN (HGB A1C)       Result Value Ref Range   Hemoglobin A1C 7.2       Assessment & Plan:  RAMESH MOAN is a 80 y.o. male Type II or unspecified type diabetes mellitus without mention of complication, not stated as uncontrolled - Plan: POCT glucose (manual entry), POCT glycosylated hemoglobin (Hb A1C)  -borderline control, but had been doing better prior. At this level will hold on med changes, but diet and activity stressed. recheck levels in 3 months.   Essential hypertension  - some elevation with cold meds likely.  No new med changes, but if remains elevated off cold medicines - may need adjustment.   Tinnitus, bilateral - Plan: Ambulatory referral to ENT  -As before, suspect presbycusis and age related sx's on right, but intermittent pulsatile L sided sx's.  Agrees to ENT eval at this time, referral placed.   Other and unspecified hyperlipidemia  -lipids stable in January. Continue lipitor at current dose.   Tobacco abuse  - discussed concerns and potential health impacts with restarting tobacco use. Advised cessation an resources below. Discussed other relaxation techniques, but to call me if this is a persistent problem.   Recheck in 3 months - fasting labs.    No orders of the defined types were placed in this encounter.   Patient Instructions  Blood pressure borderline today likely from cold medicine. Avoid decongestants in the future.  Blood sugar borderline controlled. Watch diet, but no new med changes at this time.  We will refer you to ear nose and throat to discuss sound in left ear.  Try other relaxation techniques at night, and quit smoking. [facility] offers smoking cessation clinics. Registration is required. To register call [phone] or register online at [email or web address] Return to the clinic or go to the nearest emergency room if any of your symptoms worsen or new symptoms occur. Recheck in 3 months. You can have fasting labs done that morning then eat lunch as usual.       I personally performed the services described in this documentation, which was scribed in  my presence. The recorded information has been reviewed and considered, and addended by me as needed.  \ 

## 2013-09-27 NOTE — Patient Instructions (Signed)
Blood pressure borderline today likely from cold medicine. Avoid decongestants in the future.  Blood sugar borderline controlled. Watch diet, but no new med changes at this time.  We will refer you to ear nose and throat to discuss sound in left ear.  Try other relaxation techniques at night, and quit smoking. [facility] offers smoking cessation clinics. Registration is required. To register call [phone] or register online at [email or web address] Return to the clinic or go to the nearest emergency room if any of your symptoms worsen or new symptoms occur. Recheck in 3 months. You can have fasting labs done that morning then eat lunch as usual.

## 2013-10-18 ENCOUNTER — Ambulatory Visit: Payer: Medicare HMO | Attending: Audiology | Admitting: Audiology

## 2013-10-18 DIAGNOSIS — H93291 Other abnormal auditory perceptions, right ear: Secondary | ICD-10-CM

## 2013-10-18 DIAGNOSIS — H93299 Other abnormal auditory perceptions, unspecified ear: Secondary | ICD-10-CM | POA: Insufficient documentation

## 2013-10-18 DIAGNOSIS — H903 Sensorineural hearing loss, bilateral: Secondary | ICD-10-CM

## 2013-10-18 DIAGNOSIS — H9193 Unspecified hearing loss, bilateral: Secondary | ICD-10-CM

## 2013-10-18 NOTE — Procedures (Signed)
Outpatient Audiology and [place], [place]  [address] [phone]  AUDIOLOGICAL  EVALUATION  NAME: [name]   STATUS: Outpatient DOB:   [birth year]    DIAGNOSIS: Tinnitus, Hearing Loss, Vertigo  MRN: [id number]                                                                                     DATE: 10/18/2013    REFERENT: Dr. Ernesto Rutherford, ENT HISTORY [name],  was seen for an audiological evaluation. [name] istates that he has had tinnitus in the right ear for several years (10 years plus), but recently a "popping sound has started in the left ear".  He feels that his hearing is "pretty good" but does experience some difficulty hearing in background noise. He reports occasional "lightheadedness" maybe every 6 months. .  EVALUATION: Pure tone air conduction testing showed left ear hearing thresholds somewhat better than the right ear. The left ear low frequency hearing thresholds are 20-30 dBHL with high frequency hearing thresholds of 36 dBHL to 60 dBHL.  The right ear hearing thresholds are 30-35 dBHL in the low frequencies and 45--65 dBHL in the high frequencies.  Speech reception thresholds are 30 dBHL on the left and 30 dBHL on the right using recorded spondee word lists. Word recognition was 100% at 65 dBHL on the left at and 96% at 65 dBHL on the right using recorded NU-6 word lists, in quiet.  Otoscopic inspection reveals clear ear canals with visible tympanic membranes.  Tympanometry showed (Type A) with normal middle ear pressure bilaterally; however, it was difficult to maintain a seal so that acoustic reflex decay was inconclusive.  Tone decay at 1000Hz  was negative bilaterally.   Uncomfortable Loudness Testing was performed using monitored live voice.  [name] reported that noise levels of 105dBHL  In the right ear and >100dBHL in the left started to "hurt".  Speech-in-Noise testing was performed to determine speech discrimination in the  presence of background noise.  [name] scored 84 % in the right ear and 68 % in the left ear, when noise was presented 5 dB below speech. [name] is expected to have  significant difficulty hearing and understanding in minimal background noise in the right ear.  Slight recruitment in the right ear.     Tinnitus Matching using pure tones was at 6000Hz  at approximately 50 dBHL.  No tinnitus suppression observed.  [name] reports being "very bothered" by the tinnitus at times.  Please closely monitor him for depression and other adverse effects associated with tinnitus sufferers.  CONCLUSIONS: [name] has a mild low frequency hearing loss on the right sloping to a moderate to moderately-severe high frequency hearing loss.  The left ear has a slight low frequency hearing loss sloping to a moderate to moderately severe high frequency hearing loss.  The hearing loss is sensorineural bilaterally.  He has excellent word recognition in quiet at normal conversational voice levels. In minimal background noise word recognition remains good on the left and drops to fair to borderline poor on the right.  He has normal middle ear function bilaterally. Retrocochlear testing that was completed was negative.  Please monitor [name] closely because he reports that the tinnitus causes him excessive stress and depression at times. It first occurred during the time of his marriage separation/divorce.   RECOMMENDATIONS: 1. Follow-up with Dr. Ernesto Rutherford.  2. Closely monitor hearing with a repeat test in 6-12 months because of the poorer word recognition in background noise on the right side to ensure that there is not progressive hearing loss component.  3.  Consider a hearing aid evaluation and/or a trial tinnitus masker.     Deborah L. Heide Spark, Au.D., CCC-A Doctor of Audiology 10/18/2013  cc: [name], MD

## 2013-10-19 NOTE — Procedures (Signed)
Outpatient Audiology and [facility] [place],   [address] [phone]  AUDIOLOGICAL  EVALUATION  NAME: Kemar Ciliberti   STATUS: Outpatient DOB:   1933-05-19    DIAGNOSIS: Tinnitus, Hearing Loss, Vertigo  MRN: [id number]                                                                                     DATE: 10/19/2013    REFERENT: Dr. Ernesto Rutherford, ENT HISTORY [name],  was seen for an audiological evaluation. Burnice istates that he has had tinnitus in the right ear for several years (10 years plus), but recently a "popping sound has started in the left ear".  He feels that his hearing is "pretty good" but does experience some difficulty hearing in background noise. He reports occasional "lightheadedness" maybe every 6 months. .  EVALUATION: Pure tone air conduction testing showed left ear hearing thresholds somewhat better than the right ear. The left ear low frequency hearing thresholds are 20-30 dBHL with high frequency hearing thresholds of 36 dBHL to 60 dBHL.  The right ear hearing thresholds are 30-35 dBHL in the low frequencies and 45--65 dBHL in the high frequencies.  Speech reception thresholds are 30 dBHL on the left and 30 dBHL on the right using recorded spondee word lists. Word recognition was 100% at 65 dBHL on the left at and 96% at 65 dBHL on the right using recorded NU-6 word lists, in quiet.  Otoscopic inspection reveals clear ear canals with visible tympanic membranes.  Tympanometry showed (Type A) with normal middle ear pressure bilaterally; however, it was difficult to maintain a seal so that acoustic reflex decay was inconclusive.  Tone decay at 1000Hz  was negative bilaterally.   Uncomfortable Loudness Testing was performed using monitored live voice.  Shizuo reported that noise levels of 105dBHL  In the right ear and >100dBHL in the left started to "hurt".  Speech-in-Noise testing was performed to determine speech discrimination in the  presence of background noise.  Nik scored 84 % in the right ear and 68 % in the left ear, when noise was presented 5 dB below speech. Hyden is expected to have  significant difficulty hearing and understanding in minimal background noise in the right ear.  Slight recruitment in the right ear.     Tinnitus Matching using pure tones was at 6000Hz  at approximately 50 dBHL.  No tinnitus suppression observed.  Mr. Dohmen reports being "very bothered" by the tinnitus at times.  Please closely monitor him for depression and other adverse effects associated with tinnitus sufferers.  CONCLUSIONS: Darnelle has a mild low frequency hearing loss on the right sloping to a moderate to moderately-severe high frequency hearing loss.  The left ear has a slight low frequency hearing loss sloping to a moderate to moderately severe high frequency hearing loss.  The hearing loss is sensorineural bilaterally.  He has excellent word recognition in quiet at normal conversational voice levels. In minimal background noise word recognition remains good on the left and drops to fair to borderline poor on the right.  He has normal middle ear function bilaterally. Retrocochlear testing that was completed was negative.  Please monitor Mr. Kassim closely because he reports that the tinnitus causes him excessive stress and depression at times. It first occurred during the time of his marriage separation/divorce.   RECOMMENDATIONS: 1. Follow-up with Dr. Ernesto Rutherford.  2. Closely monitor hearing with a repeat test in 6-12 months because of the poorer word recognition in background noise on the right side to ensure that there is not progressive hearing loss component.  3.  Consider a hearing aid evaluation and/or a trial tinnitus masker.     Meshach Perry L. Heide Spark, Au.D., CCC-A Doctor of Audiology 10/19/2013  cc: [name], MD

## 2013-10-19 NOTE — Patient Instructions (Signed)
CONCLUSIONS: [name] has a mild low frequency hearing loss on the right sloping to a moderate to moderately-severe high frequency hearing loss.  The left ear has a slight low frequency hearing loss sloping to a moderate to moderately severe high frequency hearing loss.  The hearing loss is sensorineural bilaterally.  He has excellent word recognition in quiet at normal conversational voice levels. In minimal background noise word recognition remains good on the left and drops to fair to borderline poor on the right.  He has normal middle ear function bilaterally. Retrocochlear testing that was completed was negative.     Speech-in-Noise testing was performed to determine speech discrimination in the presence of background noise.  [name] scored 84 % in the right ear and 68 % in the left ear, when noise was presented 5 dB below speech. [name] is expected to have  significant difficulty hearing and understanding in minimal background noise in the right ear.  Slight recruitment in the right ear.     Tinnitus Matching using pure tones was at 6000Hz  at approximately 50 dBHL.  No tinnitus suppression observed.  [name] reports being "very bothered" by the tinnitus at times.  Please closely monitor him for depression and other adverse effects associated with tinnitus sufferers.   RECOMMENDATIONS: 1. Follow-up with Dr. Ernesto Rutherford.  2. Closely monitor hearing with a repeat test in 6-12 months because of the poorer word recognition in background noise on the right side to ensure that there is not progressive hearing loss component.  3.  Consider a hearing aid evaluation and/or a trial tinnitus masker.    4.   To minimize the adverse effects of tinnitus 1) avoid quiet  2) use noise maskers at home such as a sound machine, quiet music, a fan or other background noise at a volume just loud enough to mask the high pitched tinnitus. 3) If the tinnitus becomes more bothersome, adversely affecting your sleep or  concentration, contact your physician,  seek additional medical help by Dr. Ernesto Rutherford, ENT for further treatment of your tinnitus.   Deborah L. Heide Spark, Au.D., CCC-A Doctor of Audiology 10/19/2013   Tinnitus Sounds you hear in your ears and coming from within the ear is called tinnitus. This can be a symptom of many ear disorders. It is often associated with hearing loss.  Tinnitus can be seen with:  Infections.  Ear blockages such as wax buildup.  Meniere's disease.  Ear damage.  Inherited.  Occupational causes. While irritating, it is not usually a threat to health. When the cause of the tinnitus is wax, infection in the middle ear, or foreign body it is easily treated. Hearing loss will usually be reversible.  TREATMENT  When treating the underlying cause does not get rid of tinnitus, it may be necessary to get rid of the unwanted sound by covering it up with more pleasant background noises. This may include music, the radio etc. There are tinnitus maskers which can be worn which produce background noise to cover up the tinnitus. Avoid all medications which tend to make tinnitus worse such as alcohol, caffeine, aspirin, and nicotine. There are many soothing background tapes such as rain, ocean, thunderstorms, etc. These soothing sounds help with sleeping or resting. Keep all follow-up appointments and referrals. This is important to identify the cause of the problem. It also helps avoid complications, impaired hearing, disability, or chronic pain. Document Released: 03/25/2005 Document Revised: 06/17/2011 Document Reviewed: 11/11/2007 [facility] Patient Information 2015 [place], [place]. This information is not intended  to replace advice given to you by your health care provider. Make sure you discuss any questions you have with your health care provider.  

## 2013-11-19 ENCOUNTER — Encounter: Payer: Self-pay | Admitting: Family Medicine

## 2013-11-19 DIAGNOSIS — H93A9 Pulsatile tinnitus, unspecified ear: Secondary | ICD-10-CM | POA: Insufficient documentation

## 2013-11-19 DIAGNOSIS — H9319 Tinnitus, unspecified ear: Secondary | ICD-10-CM

## 2013-11-26 DIAGNOSIS — C61 Malignant neoplasm of prostate: Secondary | ICD-10-CM

## 2013-11-26 DIAGNOSIS — N529 Male erectile dysfunction, unspecified: Secondary | ICD-10-CM | POA: Insufficient documentation

## 2013-11-26 HISTORY — DX: Malignant neoplasm of prostate: C61

## 2013-12-01 DIAGNOSIS — N3040 Irradiation cystitis without hematuria: Secondary | ICD-10-CM | POA: Insufficient documentation

## 2013-12-01 DIAGNOSIS — R319 Hematuria, unspecified: Secondary | ICD-10-CM

## 2013-12-01 HISTORY — DX: Irradiation cystitis without hematuria: N30.40

## 2013-12-01 HISTORY — DX: Hematuria, unspecified: R31.9

## 2013-12-27 ENCOUNTER — Encounter: Payer: Self-pay | Admitting: Family Medicine

## 2013-12-27 ENCOUNTER — Ambulatory Visit (INDEPENDENT_AMBULATORY_CARE_PROVIDER_SITE_OTHER): Payer: Medicare HMO | Admitting: Family Medicine

## 2013-12-27 VITALS — BP 148/62 | HR 64 | Temp 98.0°F | Resp 16 | Ht 69.25 in | Wt 209.4 lb

## 2013-12-27 DIAGNOSIS — E119 Type 2 diabetes mellitus without complications: Secondary | ICD-10-CM

## 2013-12-27 DIAGNOSIS — R0789 Other chest pain: Secondary | ICD-10-CM

## 2013-12-27 DIAGNOSIS — R002 Palpitations: Secondary | ICD-10-CM

## 2013-12-27 DIAGNOSIS — E785 Hyperlipidemia, unspecified: Secondary | ICD-10-CM

## 2013-12-27 DIAGNOSIS — Z23 Encounter for immunization: Secondary | ICD-10-CM

## 2013-12-27 DIAGNOSIS — I10 Essential (primary) hypertension: Secondary | ICD-10-CM

## 2013-12-27 LAB — POCT GLYCOSYLATED HEMOGLOBIN (HGB A1C): Hemoglobin A1C: 7

## 2013-12-27 LAB — GLUCOSE, POCT (MANUAL RESULT ENTRY): POC Glucose: 127 mg/dl — AB (ref 70–99)

## 2013-12-27 MED ORDER — METFORMIN HCL 1000 MG PO TABS: ORAL_TABLET | ORAL | Status: DC

## 2013-12-27 MED ORDER — INSULIN GLARGINE 100 UNIT/ML ~~LOC~~ SOLN: 8.0000 [IU] | Freq: Every day | SUBCUTANEOUS | Status: DC

## 2013-12-27 MED ORDER — ATORVASTATIN CALCIUM 10 MG PO TABS: 10.0000 mg | ORAL_TABLET | Freq: Every day | ORAL | Status: DC

## 2013-12-27 MED ORDER — LISINOPRIL-HYDROCHLOROTHIAZIDE 20-25 MG PO TABS: ORAL_TABLET | ORAL | Status: DC

## 2013-12-27 MED ORDER — METOPROLOL TARTRATE 50 MG PO TABS: 50.0000 mg | ORAL_TABLET | Freq: Two times a day (BID) | ORAL | Status: DC

## 2013-12-27 MED ORDER — LISINOPRIL 20 MG PO TABS: ORAL_TABLET | ORAL | Status: DC

## 2013-12-27 NOTE — Progress Notes (Signed)
Subjective:    Patient ID: [name], male    DOB: 1933-08-04, 80 y.o.   MRN: [id number] This chart was scribed for [name], MD by Cathie Hoops, ED Scribe. The patient was seen in Room 24 The patient's care was started at 1:38 PM.   12/27/2013  Diabetes   Diabetes Pertinent negatives for hypoglycemia include no dizziness or headaches. Associated symptoms include chest pain. Pertinent negatives for diabetes include no fatigue.   HPI Comments: [name] is a 80 y.o. male who presents to the [facility] follow-up visit for diabetes. He is here for f/u for type 2 diabetes and other concerns.   1.) Diabetes Type 2 Last office visit June 22. Last A1C 7.2 at that time, continued same dose of Lantus of 8 units at bedtime as well as 1000 mg of metformin. Weight 211 pounds to 209 pounds today.  Pt states he is compliant with his medications and denies missing any doses.   2.) CAD History of a LAD stent in October 2009. History of VT-fibrillration in February 2010 but aborted due to inappropriate substrate. Last Myoview 04/11/11, normal. Off of aspirin due to history of rectal bleeding. Last seen by cardiologist on Sep 02, 2012 with Dr. [name]. Last lipid panel in January of this year, normal LDL of 43 borderline low HDL at 37. Pt notes he quit smoking about 3 weeks ago. He states his pain lasted few hours and has not returned. Marland Kitchen He localized his pain to the center of his chest with no radiating pain. He notes he will see his cardiologist in December. He describes had some heaviness in his chest Pt denies belching, burping, heartburn, nausea, diaphoresis, nausea, fevers, vomiting, headache, light-headedness or dizziness.   3.) Hypertension Takes Lisinopril 20 mg q.d, in addition to Zestoretic 20-25 mg q.d and metoprolol 50 mg BID.  Pt notes that he checks his BP at home with normal readings between 120s/60s. Pt denies feeling palpitations or  light-headedness.  Pt states he is happier now than he has been in many years.  History of rectal bleeding when taking aspirin, no occurrence off of aspirin.  Review of Systems  Constitutional: Negative for fatigue and unexpected weight change.  Eyes: Negative for visual disturbance.  Respiratory: Negative for cough, chest tightness and shortness of breath.   Cardiovascular: Positive for chest pain. Negative for palpitations and leg swelling.  Gastrointestinal: Negative for abdominal pain and blood in stool.  Neurological: Negative for dizziness, light-headedness and headaches.  All other systems reviewed and are negative.    Patient Active Problem List   Diagnosis Date Noted  . Pulsatile tinnitus 11/19/2013  . Rectal bleeding 12/23/2012  . Type II or unspecified type diabetes mellitus without mention of complication, not stated as uncontrolled   . Essential hypertension, benign   . Chronic back pain   . ED (erectile dysfunction)   . Anemia   . Hyperlipidemia   . CAD, NATIVE VESSEL 05/08/2008  . VENTRICULAR TACHYCARDIA 05/08/2008  . PREMATURE VENTRICULAR CONTRACTIONS 05/08/2008   Past Medical History  Diagnosis Date  . Type II or unspecified type diabetes mellitus without mention of complication, not stated as uncontrolled   . Essential hypertension, benign   . Chronic back pain   . ED (erectile dysfunction)   . Prostate cancer     s/p  . CAD (coronary artery disease)     pci to LAD 10/09; stable CAD by cath 05/31/08; myoview 04/11/11- no  ishcemia, EF 67%; echo 05/15/09- EF>55%, mod calcification of the aortic valve leaflets  . Ventricular tachycardia     resuscitated; monitor 05/2008; attempted T ablation 2/10- aborted due to inappropriate substrate  . Hyperlipidemia   . Allergy    Past Surgical History  Procedure Laterality Date  . Ptca  01/2008  . Colectomy  '80's    polyps  . Coronary stent placement  01/19/08    PCI stent to mid LAD with Promus DES 27.5x28  .  Cardiac catheterization  05/31/08    EF 55-60%, stable lesions, medical therapy   Allergies  Allergen Reactions  . Niacin And Related     flushing   Prior to Admission medications   Medication Sig Start Date End Date Taking? Authorizing Provider  atorvastatin (LIPITOR) 10 MG tablet Take 1 tablet (10 mg total) by mouth daily. 06/21/13  Yes [name], MD  Calcium Carb-Cholecalciferol (CALCIUM 1000 + D PO) Take by mouth.   Yes Historical Provider, MD  finasteride (PROSCAR) 5 MG tablet Take 5 mg by mouth daily.   Yes Historical Provider, MD  fish oil-omega-3 fatty acids 1000 MG capsule Take 1 g by mouth daily.   Yes Historical Provider, MD  glucose blood (TRUETEST TEST) test strip USE AS DIRECTED THREE TIMES DAILY 06/21/13  Yes [name], MD  insulin glargine (LANTUS) 100 UNIT/ML injection Inject 0.08 mLs (8 Units total) into the skin at bedtime. 06/21/13  Yes [name], MD  Insulin Syringe-Needle U-100 (B-D INS SYR HALF-UNIT .3CC/31G) 31G X 5/16" 0.3 ML MISC Use daily at bedtime to inject insulin. Dx code: 250.00 06/21/13  Yes [name], MD  lisinopril (PRINIVIL,ZESTRIL) 20 MG tablet TAKE 1 TABLET (20 MG TOTAL) BY MOUTH DAILY. 06/21/13  Yes [name], MD  lisinopril-hydrochlorothiazide (PRINZIDE,ZESTORETIC) 20-25 MG per tablet TAKE 1 TABLET BY MOUTH EVERY DAY 06/21/13  Yes [name], MD  metFORMIN (GLUCOPHAGE) 1000 MG tablet TAKE 1 TABLET (1,000 MG TOTAL) BY MOUTH 2 (TWO) TIMES DAILY WITH A MEAL. 06/21/13  Yes [name], MD  metoprolol (LOPRESSOR) 50 MG tablet Take 1 tablet (50 mg total) by mouth 2 (two) times daily. 06/21/13  Yes [name], MD  Multiple Vitamin (MULTIVITAMIN) tablet Take 1 tablet by mouth daily.   Yes Historical Provider, MD  Vitamin D, Cholecalciferol, 1000 UNITS TABS Take by mouth daily.   Yes Historical Provider, MD   History   Social History  . Marital Status: Legally Separated    Spouse Name: [name]    Number of Children: 7   . Years of Education: [name]   Occupational History  . RETIRED    Social History Main Topics  . Smoking status: Former Smoker -- 0.50 packs/day for 15 years    Types: Cigarettes    Quit date: 09/01/1990  . Smokeless tobacco: Not on file     Comment: quit 12/07/2013  . Alcohol Use: No  . Drug Use: No  . Sexual Activity: No   Other Topics Concern  . Not on file   Social History Narrative   29 grandchildren. Single. Education: 12 th grade. Exercise: 3-4 times a week for 30 minutes working out and set ups.     Objective:  Physical Exam  Vitals reviewed. Constitutional: He is oriented to person, place, and time. He appears well-developed and well-nourished.  HENT:  Head: Normocephalic and atraumatic.  Eyes: EOM are normal. Pupils are equal, round, and reactive to light.  Neck: No  JVD present. Carotid bruit is not present.  Cardiovascular: Normal rate, regular rhythm and normal heart sounds.   No murmur heard. Pulmonary/Chest: Effort normal and breath sounds normal. He has no rales.  Musculoskeletal: He exhibits no edema.  Neurological: He is alert and oriented to person, place, and time.  Skin: Skin is warm and dry.  Psychiatric: He has a normal mood and affect.    Filed Vitals:   12/27/13 1308  BP: 148/62  Pulse: 64  Temp: 98 F (36.7 C)  TempSrc: Oral  Resp: 16  Height: 5' 9.25" (1.759 m)  Weight: 209 lb 6.4 oz (94.983 kg)  SpO2: 98%   EKG: Sinus rhythm, rate 61 with frequent PACs this was compared to his May 2014 EKG that had a rate of 70 with sinus arrythmia.    Results for orders placed in visit on 12/27/13  GLUCOSE, POCT (MANUAL RESULT ENTRY)      Result Value Ref Range   POC Glucose 127 (*) 70 - 99 mg/dl  POCT GLYCOSYLATED HEMOGLOBIN (HGB A1C)      Result Value Ref Range   Hemoglobin A1C 7.0      Assessment & Plan:  1:51 PM- Patient informed of current plan for treatment and evaluation and agrees with plan at this time. [name] is a 80 y.o.  male Type II or unspecified type diabetes mellitus without mention of complication, not stated as uncontrolled - Plan: POCT glucose (manual entry), POCT glycosylated hemoglobin (Hb A1C), HM Diabetes Foot Exam,  - insulin glargine (LANTUS) 100 UNIT/ML injection and metformin refilled. Same doses as overall controlled.   Chest heaviness - Plan: Ambulatory referral to Cardiology, tsh.   -sinus arrhythmia with frequent PVC;s on EKG. Prior episode of chest pain, nonexertional and resolved on own.  asx currently, but with hx of CAD, will refer him back to his cardiologist for sooner appt than routine scheduled later this year. Check TSH, and 911/ER chest pain precautions reviewed.   Essential hypertension - Plan: Ambulatory referral to Cardiology, COMPLETE METABOLIC PANEL WITH GFR, Lipid panel, TSH, lisinopril-hydrochlorothiazide (PRINZIDE,ZESTORETIC) 20-25 MG per tablet, metoprolol (LOPRESSOR) 50 MG tablet, lisinopril (PRINIVIL,ZESTRIL) 20 MG tablet   -borderline high here, but controlled on home readings, no new changes at this time. Advised to check home BP's and call if running over 140/90 (typo in patient instructions, but verbally discussed).  Need for immunization against influenza - Plan: Flu Vaccine QUAD 36+ mos IM given.   Need for prophylactic vaccination against Streptococcus pneumoniae (pneumococcus) - Plan: Pneumococcal conjugate vaccine 13-valent IM  -s/p pneumovax, so prevnar given today.   Other and unspecified hyperlipidemia - Plan: atorvastatin (LIPITOR) 10 MG tablet  -Refilled at same dose. Repeat lipid panel ordered.   Recheck in 3 months.    Meds ordered this encounter  Medications  . atorvastatin (LIPITOR) 10 MG tablet    Sig: Take 1 tablet (10 mg total) by mouth daily.    Dispense:  90 tablet    Refill:  1  . insulin glargine (LANTUS) 100 UNIT/ML injection    Sig: Inject 0.08 mLs (8 Units total) into the skin at bedtime.    Dispense:  10 mL    Refill:  3  .  lisinopril-hydrochlorothiazide (PRINZIDE,ZESTORETIC) 20-25 MG per tablet    Sig: TAKE 1 TABLET BY MOUTH EVERY DAY    Dispense:  90 tablet    Refill:  1  . metFORMIN (GLUCOPHAGE) 1000 MG tablet    Sig: TAKE 1 TABLET (1,000 MG  TOTAL) BY MOUTH 2 (TWO) TIMES DAILY WITH A MEAL.    Dispense:  180 tablet    Refill:  1  . metoprolol (LOPRESSOR) 50 MG tablet    Sig: Take 1 tablet (50 mg total) by mouth 2 (two) times daily.    Dispense:  180 tablet    Refill:  1  . lisinopril (PRINIVIL,ZESTRIL) 20 MG tablet    Sig: TAKE 1 TABLET (20 MG TOTAL) BY MOUTH DAILY.    Dispense:  90 tablet    Refill:  1   Patient Instructions  Keep a record of your blood pressures outside of the office and if running over 10/90 - call me as dose changes may be needed. We will call over to your cardiologist to try to get you seen in next 2 weeks, but if chest symptoms return or new/worsening symptoms - go to ER as discussed.  (Return to the clinic or go to the nearest emergency room if any of your symptoms worsen or new symptoms occur).   Recheck in 3 months.

## 2013-12-27 NOTE — Patient Instructions (Addendum)
Keep a record of your blood pressures outside of the office and if running over 10/90 - call me as dose changes may be needed. We will call over to your cardiologist to try to get you seen in next 2 weeks, but if chest symptoms return or new/worsening symptoms - go to ER as discussed.  (Return to the clinic or go to the nearest emergency room if any of your symptoms worsen or new symptoms occur).   Recheck in 3 months.

## 2013-12-28 LAB — COMPLETE METABOLIC PANEL WITH GFR
ALT: 12 U/L (ref 0–53)
AST: 15 U/L (ref 0–37)
Albumin: 3.9 g/dL (ref 3.5–5.2)
Alkaline Phosphatase: 36 U/L — ABNORMAL LOW (ref 39–117)
BUN: 14 mg/dL (ref 6–23)
CO2: 25 mEq/L (ref 19–32)
Calcium: 9.3 mg/dL (ref 8.4–10.5)
Chloride: 104 mEq/L (ref 96–112)
Creat: 1.16 mg/dL (ref 0.50–1.35)
GFR, Est African American: 70 mL/min
GFR, Est Non African American: 60 mL/min
Glucose, Bld: 113 mg/dL — ABNORMAL HIGH (ref 70–99)
Potassium: 4.7 mEq/L (ref 3.5–5.3)
Sodium: 137 mEq/L (ref 135–145)
Total Bilirubin: 0.5 mg/dL (ref 0.2–1.2)
Total Protein: 6.3 g/dL (ref 6.0–8.3)

## 2013-12-28 LAB — LIPID PANEL
Cholesterol: 131 mg/dL (ref 0–200)
HDL: 40 mg/dL (ref >39)
LDL Cholesterol: 64 mg/dL (ref 0–99)
Total CHOL/HDL Ratio: 3.3 Ratio
Triglycerides: 137 mg/dL (ref <150)
VLDL: 27 mg/dL (ref 0–40)

## 2013-12-28 LAB — TSH: TSH: 2.922 u[IU]/mL (ref 0.350–4.500)

## 2013-12-31 ENCOUNTER — Telehealth: Payer: Self-pay | Admitting: Cardiovascular Disease

## 2013-12-31 NOTE — Telephone Encounter (Signed)
Closed encounters °

## 2014-02-08 ENCOUNTER — Ambulatory Visit (INDEPENDENT_AMBULATORY_CARE_PROVIDER_SITE_OTHER): Payer: Medicare HMO | Admitting: Cardiovascular Disease

## 2014-02-08 ENCOUNTER — Encounter: Payer: Self-pay | Admitting: Cardiovascular Disease

## 2014-02-08 VITALS — BP 140/80 | HR 71 | Ht 72.0 in | Wt 213.0 lb

## 2014-02-08 DIAGNOSIS — I2510 Atherosclerotic heart disease of native coronary artery without angina pectoris: Secondary | ICD-10-CM

## 2014-02-08 DIAGNOSIS — I10 Essential (primary) hypertension: Secondary | ICD-10-CM

## 2014-02-08 DIAGNOSIS — E785 Hyperlipidemia, unspecified: Secondary | ICD-10-CM

## 2014-02-08 MED ORDER — SILDENAFIL CITRATE 25 MG PO TABS: 25.0000 mg | ORAL_TABLET | Freq: Every day | ORAL | Status: DC | PRN

## 2014-02-08 NOTE — Assessment & Plan Note (Signed)
History of CAD status post LAD stenting with a Promus drug eluding stent October 2009. His last Myoview performed 04/11/11 was normal. He is asymptomatic

## 2014-02-08 NOTE — Progress Notes (Signed)
02/08/2014 [name]   1933/07/09  [id number]  Primary Physician [name], MD Primary Cardiologist: Lorretta Harp MD Renae Gloss   HPI:  The patient is a 80 year old moderately overweight, divorced African American male, father of 3 children and 31 stepchildren, grandfather to 10 grandchildren, who is formerly a patient of Dr. Francine Graven.I last saw him 09/02/12. He has a history of CAD status post LAD stenting with a Promus drug-eluting stent, October of 2009. He had attempt at VT ablation by Dr. Thompson Grayer, February 2010; however, this was aborted because of inappropriate substrate. His other problems include hypertension, hyperlipidemia, diabetes, as well as history of prostate cancer. Since I saw him, he has been asymptomatic. His last Myoview performed 04/11/11 was completely normal. He did stop his aspirin because of rectal bleeding is noticed increased evening flushing with Niaspan. His most recent lipid profile performed 12/27/13 revealed a total cholesterol of 131, LDL of 64 and HDL of 40.   Current Outpatient Prescriptions  Medication Sig Dispense Refill  . atorvastatin (LIPITOR) 10 MG tablet Take 1 tablet (10 mg total) by mouth daily. 90 tablet 1  . glucose blood (TRUETEST TEST) test strip USE AS DIRECTED THREE TIMES DAILY 100 each 3  . insulin glargine (LANTUS) 100 UNIT/ML injection Inject 0.08 mLs (8 Units total) into the skin at bedtime. 10 mL 3  . Insulin Syringe-Needle U-100 (B-D INS SYR HALF-UNIT .3CC/31G) 31G X 5/16" 0.3 ML MISC Use daily at bedtime to inject insulin. Dx code: 250.00 100 each 3  . lisinopril (PRINIVIL,ZESTRIL) 20 MG tablet TAKE 1 TABLET (20 MG TOTAL) BY MOUTH DAILY. 90 tablet 1  . lisinopril-hydrochlorothiazide (PRINZIDE,ZESTORETIC) 20-25 MG per tablet TAKE 1 TABLET BY MOUTH EVERY DAY 90 tablet 1  . metFORMIN (GLUCOPHAGE) 1000 MG tablet TAKE 1 TABLET (1,000 MG TOTAL) BY MOUTH 2 (TWO) TIMES DAILY WITH A MEAL. 180 tablet 1  .  metoprolol (LOPRESSOR) 50 MG tablet Take 1 tablet (50 mg total) by mouth 2 (two) times daily. 180 tablet 1  . Multiple Vitamin (MULTIVITAMIN) tablet Take 1 tablet by mouth daily.    . Vitamin D, Cholecalciferol, 1000 UNITS TABS Take by mouth daily.    . sildenafil (VIAGRA) 25 MG tablet Take 1 tablet (25 mg total) by mouth daily as needed for erectile dysfunction. 10 tablet 0   No current facility-administered medications for this visit.    Allergies  Allergen Reactions  . Niacin And Related     flushing    History   Social History  . Marital Status: Legally Separated    Spouse Name: [name]    Number of Children: 7  . Years of Education: [name]   Occupational History  . RETIRED    Social History Main Topics  . Smoking status: Former Smoker -- 0.50 packs/day for 15 years    Types: Cigarettes    Quit date: 09/01/1990  . Smokeless tobacco: Not on file     Comment: quit 12/07/2013  . Alcohol Use: No  . Drug Use: No  . Sexual Activity: No   Other Topics Concern  . Not on file   Social History Narrative   29 grandchildren. Single. Education: 12 th grade. Exercise: 3-4 times a week for 30 minutes working out and set ups.     Review of Systems: General: negative for chills, fever, night sweats or weight changes.  Cardiovascular: negative for chest pain, dyspnea on exertion, edema, orthopnea, palpitations, paroxysmal nocturnal dyspnea or shortness of breath  Dermatological: negative for rash Respiratory: negative for cough or wheezing Urologic: negative for hematuria Abdominal: negative for nausea, vomiting, diarrhea, bright red blood per rectum, melena, or hematemesis Neurologic: negative for visual changes, syncope, or dizziness All other systems reviewed and are otherwise negative except as noted above.    Blood pressure 140/80, pulse 71, height 6' (1.829 m), weight 213 lb (96.616 kg).  General appearance: alert and no distress Neck: no adenopathy, no carotid bruit, no JVD,  supple, symmetrical, trachea midline and thyroid not enlarged, symmetric, no tenderness/mass/nodules Lungs: clear to auscultation bilaterally Heart: regular rate and rhythm, S1, S2 normal, no murmur, click, rub or gallop Extremities: extremities normal, atraumatic, no cyanosis or edema  EKG normal sinus rhythm at 71 without ST or T-wave changes  ASSESSMENT AND PLAN:   CAD, NATIVE VESSEL History of CAD status post LAD stenting with a Promus drug eluding stent October 2009. His last Myoview performed 04/11/11 was normal. He is asymptomatic  Essential hypertension, benign Controlled on current medications  Hyperlipidemia On statin therapy with recent lipid profile performed 12/27/13 revealing a total cholesterol of 131, LDL of 64 and HDL of 40      Lorretta Harp MD [facility], [facility] 02/08/2014 9:45 AM

## 2014-02-08 NOTE — Assessment & Plan Note (Signed)
On statin therapy with recent lipid profile performed 12/27/13 revealing a total cholesterol of 131, LDL of 64 and HDL of 40

## 2014-02-08 NOTE — Patient Instructions (Signed)
Dr Berry recommends that you schedule a follow-up appointment in 1 year. You will receive a reminder letter in the mail two months in advance. If you don't receive a letter, please call our office to schedule the follow-up appointment. 

## 2014-02-08 NOTE — Assessment & Plan Note (Signed)
Controlled on current medications 

## 2014-02-21 ENCOUNTER — Other Ambulatory Visit: Payer: Self-pay

## 2014-02-21 DIAGNOSIS — E119 Type 2 diabetes mellitus without complications: Secondary | ICD-10-CM

## 2014-02-21 DIAGNOSIS — E785 Hyperlipidemia, unspecified: Secondary | ICD-10-CM

## 2014-02-21 DIAGNOSIS — I10 Essential (primary) hypertension: Secondary | ICD-10-CM

## 2014-02-21 MED ORDER — METOPROLOL TARTRATE 50 MG PO TABS: 50.0000 mg | ORAL_TABLET | Freq: Two times a day (BID) | ORAL | Status: DC

## 2014-02-21 MED ORDER — LISINOPRIL-HYDROCHLOROTHIAZIDE 20-25 MG PO TABS: ORAL_TABLET | ORAL | Status: DC

## 2014-02-21 MED ORDER — LISINOPRIL 20 MG PO TABS
ORAL_TABLET | ORAL | Status: DC
Start: 2014-02-21 — End: 2014-10-03

## 2014-02-21 MED ORDER — ATORVASTATIN CALCIUM 10 MG PO TABS: 10.0000 mg | ORAL_TABLET | Freq: Every day | ORAL | Status: DC

## 2014-02-21 MED ORDER — METFORMIN HCL 1000 MG PO TABS
ORAL_TABLET | ORAL | Status: DC
Start: 2014-02-21 — End: 2014-07-06

## 2014-03-28 ENCOUNTER — Encounter: Payer: Self-pay | Admitting: Family Medicine

## 2014-03-28 ENCOUNTER — Ambulatory Visit (INDEPENDENT_AMBULATORY_CARE_PROVIDER_SITE_OTHER): Payer: Medicare HMO | Admitting: Family Medicine

## 2014-03-28 VITALS — BP 140/80 | HR 58 | Temp 98.1°F | Resp 16 | Ht 69.5 in | Wt 209.8 lb

## 2014-03-28 DIAGNOSIS — I10 Essential (primary) hypertension: Secondary | ICD-10-CM

## 2014-03-28 DIAGNOSIS — M25561 Pain in right knee: Secondary | ICD-10-CM

## 2014-03-28 DIAGNOSIS — E785 Hyperlipidemia, unspecified: Secondary | ICD-10-CM

## 2014-03-28 DIAGNOSIS — E119 Type 2 diabetes mellitus without complications: Secondary | ICD-10-CM

## 2014-03-28 LAB — POCT GLYCOSYLATED HEMOGLOBIN (HGB A1C): Hemoglobin A1C: 7.3

## 2014-03-28 LAB — GLUCOSE, POCT (MANUAL RESULT ENTRY): POC Glucose: 120 mg/dl — AB (ref 70–99)

## 2014-03-28 NOTE — Progress Notes (Signed)
Subjective:  This chart was scribed for [name], MD by Erling Conte, Medical Scribe. This patient was seen in Room 22 and the patient's care was started at 1:35 PM.   Patient ID: [name], male    DOB: April 19, 1933, 79 y.o.   MRN: [id number]  Chief Complaint  Patient presents with  . Diabetes  . Hypertension    HPI HPI Comments: [name] is a 80 y.o. male who presents to the [facility] for follow up of diabetes and HTN. Last office visit with Dr. Carlota Raspberry was 12/27/13. Pt states he has been happy, healthy and doing well since his last visit.   1. Diabetes-  A1C 7.0 at last visit. Kept him on same doses of Lantus 8 units at bedtime and Metformin 1000 mg BID. His weight has remained stable from last visit at 209 lbs. He is s/p pneumovax and Prevnar and flu shot given this year. Last urine microalbumin was September 2014, elevated at 4.57. Pt checks his BS readings at home and he usually ranges from 98-110. No symptomatic lows  Dentist: Last dental appt was 1.5 months ago. He had x-rays done and a dental checkup. He had partials put in on his front teeth.   Eye Care Provider: last visit with an eye care provider was 1 month ago. No retinopathy on eye exam.  2. HTN- Last kidney function was normal 3 months ago with eGFR 70. Last cardiology visit November 3rd with Dr. [name]. He states that no medications were changed at last visit. H/o CAD s/p LAD stent October 2009. Myoview January 2013 normal. Pt measures his blood pressure readings at home and he says the numbers are stable. The readings are usually around 119/75. He denies any lightheadedness, dizziness, blood in stool, melena, abdominal pain, or HAs.   3. Bilateral Knee Pain- He has recently been complaining of bilateral knee pain. He has been taking Advil for the pain. He notes that if he sits for a long amount of time and tries to get up he feels like his knee joints tighten up. He denies any  recent injuries to the knees.   4. Hyperlipidemia-  Last lipid panel was overall normal in September of this year.    Patient Active Problem List   Diagnosis Date Noted  . Pulsatile tinnitus 11/19/2013  . Rectal bleeding 12/23/2012  . Type II or unspecified type diabetes mellitus without mention of complication, not stated as uncontrolled   . Essential hypertension, benign   . Chronic back pain   . ED (erectile dysfunction)   . Anemia   . Hyperlipidemia   . CAD, NATIVE VESSEL 05/08/2008  . VENTRICULAR TACHYCARDIA 05/08/2008  . PREMATURE VENTRICULAR CONTRACTIONS 05/08/2008   Past Medical History  Diagnosis Date  . Type II or unspecified type diabetes mellitus without mention of complication, not stated as uncontrolled   . Essential hypertension, benign   . Chronic back pain   . ED (erectile dysfunction)   . Prostate cancer     s/p  . CAD (coronary artery disease)     pci to LAD 10/09; stable CAD by cath 05/31/08; myoview 04/11/11- no ishcemia, EF 67%; echo 05/15/09- EF>55%, mod calcification of the aortic valve leaflets  . Ventricular tachycardia     resuscitated; monitor 05/2008; attempted T ablation 2/10- aborted due to inappropriate substrate  . Hyperlipidemia   . Allergy    Past Surgical History  Procedure Laterality Date  . Ptca  01/2008  .  Colectomy  '80's    polyps  . Coronary stent placement  01/19/08    PCI stent to mid LAD with Promus DES 27.5x28  . Cardiac catheterization  05/31/08    EF 55-60%, stable lesions, medical therapy   Allergies  Allergen Reactions  . Niacin And Related     flushing   Prior to Admission medications   Medication Sig Start Date End Date Taking? Authorizing Provider  atorvastatin (LIPITOR) 10 MG tablet Take 1 tablet (10 mg total) by mouth daily. 02/21/14   [name], MD  glucose blood (TRUETEST TEST) test strip USE AS DIRECTED THREE TIMES DAILY 06/21/13   [name], MD  insulin glargine (LANTUS) 100 UNIT/ML injection Inject  0.08 mLs (8 Units total) into the skin at bedtime. 12/27/13   [name], MD  Insulin Syringe-Needle U-100 (B-D INS SYR HALF-UNIT .3CC/31G) 31G X 5/16" 0.3 ML MISC Use daily at bedtime to inject insulin. Dx code: 250.00 06/21/13   [name], MD  lisinopril (PRINIVIL,ZESTRIL) 20 MG tablet TAKE 1 TABLET (20 MG TOTAL) BY MOUTH DAILY. 02/21/14   [name], MD  lisinopril-hydrochlorothiazide (PRINZIDE,ZESTORETIC) 20-25 MG per tablet TAKE 1 TABLET BY MOUTH EVERY DAY 02/21/14   [name], MD  metFORMIN (GLUCOPHAGE) 1000 MG tablet TAKE 1 TABLET (1,000 MG TOTAL) BY MOUTH 2 (TWO) TIMES DAILY WITH A MEAL. 02/21/14   [name], MD  metoprolol (LOPRESSOR) 50 MG tablet Take 1 tablet (50 mg total) by mouth 2 (two) times daily. 02/21/14   [name], MD  Multiple Vitamin (MULTIVITAMIN) tablet Take 1 tablet by mouth daily.    Historical Provider, MD  sildenafil (VIAGRA) 25 MG tablet Take 1 tablet (25 mg total) by mouth daily as needed for erectile dysfunction. 02/08/14   Lorretta Harp, MD  Vitamin D, Cholecalciferol, 1000 UNITS TABS Take by mouth daily.    Historical Provider, MD   History   Social History  . Marital Status: Legally Separated    Spouse Name: [name]    Number of Children: 7  . Years of Education: [name]   Occupational History  . RETIRED    Social History Main Topics  . Smoking status: Former Smoker -- 0.50 packs/day for 15 years    Types: Cigarettes    Quit date: 09/01/1990  . Smokeless tobacco: Not on file     Comment: quit 12/07/2013  . Alcohol Use: No  . Drug Use: No  . Sexual Activity: No   Other Topics Concern  . Not on file   Social History Narrative   29 grandchildren. Single. Education: 12 th grade. Exercise: 3-4 times a week for 30 minutes working out and set ups.     Review of Systems  Constitutional: Negative for fatigue and unexpected weight change.  Eyes: Negative for visual disturbance.  Respiratory: Negative for cough, chest  tightness and shortness of breath.   Cardiovascular: Negative for chest pain, palpitations and leg swelling.  Gastrointestinal: Negative for abdominal pain and blood in stool.  Musculoskeletal: Positive for arthralgias (bilateral knee).  Neurological: Negative for dizziness, light-headedness and headaches.  All other systems reviewed and are negative.      Objective:   Physical Exam  Constitutional: He is oriented to person, place, and time. He appears well-developed and well-nourished.  HENT:  Head: Normocephalic and atraumatic.  Eyes: EOM are normal. Pupils are equal, round, and reactive to light.  Neck: No JVD present. Carotid bruit is not present.  Cardiovascular: Normal rate, regular rhythm and normal heart sounds.   No murmur heard. Pulmonary/Chest: Effort normal and breath sounds normal. He has no rales.  Musculoskeletal: He exhibits no edema.       Right knee: He exhibits normal range of motion, no effusion and no bony tenderness (focal).  Right knee: Negative Mc[place]ray  Neurological: He is alert and oriented to person, place, and time.  Skin: Skin is warm and dry.  Psychiatric: He has a normal mood and affect.  Vitals reviewed.    Filed Vitals:   03/28/14 1324  BP: 140/80  Pulse: 58  Temp: 98.1 F (36.7 C)  TempSrc: Oral  Resp: 16  Height: 5' 9.5" (1.765 m)  Weight: 209 lb 12.8 oz (95.165 kg)  SpO2: 97%   Results for orders placed or performed in visit on 03/28/14  POCT glucose (manual entry)  Result Value Ref Range   POC Glucose 120 (A) 70 - 99 mg/dl  POCT glycosylated hemoglobin (Hb A1C)  Result Value Ref Range   Hemoglobin A1C 7.3        Assessment & Plan   Wendal L Gudiel is a 80 y.o. male Diabetes mellitus without complication - Plan: [facility] Diabetes Foot Exam, POCT glucose (manual entry), POCT glycosylated hemoglobin (Hb A1C), Microalbumin, urine  -borderline control. Cont same meds, but watch diet and increase activity - recheck in 3 months.   Knee  pain, right  Suspected OA. Sx care, tylenol if needed, and rtc if persists, worsens.   Essential hypertension  -stable on home readings. No med changes for now.   Hyperlipidemia  Prior controlled on lipids in September.  No med changes.   No orders of the defined types were placed in this encounter.   Patient Instructions  Your blood sugar was a little higher than in the past, but not my much. Continue same doses of medicines, but continue to watch diet and walking for exercise. recheck in 3 months. Tylenol for knee pain -advil only if not improving with tylenol. If your knee pain is worsening - return for possible other testing or treatments.      I personally performed the services described in this documentation, which was scribed in my presence. The recorded information has been reviewed and considered, and addended by me as needed.

## 2014-03-28 NOTE — Patient Instructions (Addendum)
Your blood sugar was a little higher than in the past, but not my much. Continue same doses of medicines, but continue to watch diet and walking for exercise. recheck in 3 months. Tylenol for knee pain -advil only if not improving with tylenol. If your knee pain is worsening - return for possible other testing or treatments.

## 2014-03-29 LAB — MICROALBUMIN, URINE: Microalb, Ur: 14.6 mg/dL — ABNORMAL HIGH (ref <2.0)

## 2014-04-16 ENCOUNTER — Encounter: Payer: Self-pay | Admitting: Radiology

## 2014-05-06 ENCOUNTER — Telehealth: Payer: Self-pay

## 2014-05-06 NOTE — Telephone Encounter (Signed)
Called pharm who advised that the problem is that 1 vial (75ml) lasts the pt over 90 days at dosage of 8 units QD, and there is no way to dispense smaller amount than 1 vial. It would be fraud for them to run that as a 30 day supply for pt. Called and explained this to pt and he stated that there is no way he can afford to pay for the lantus as a 90 day supply. Dr Carlota Raspberry, do you want to switch him to a less expensive type of insulin?

## 2014-05-06 NOTE — Telephone Encounter (Signed)
Can ask pharmacist for other less expensive options, but ideally would prefer to stay on lantus.  Let me know if better options.

## 2014-05-06 NOTE — Telephone Encounter (Signed)
Pt states [facility] only provided him with 100 or so days of his insulin. Pt states the 100 day supply is too expensive. Pt wants [facility] to be given permission or sent a new rx for 30 day supply so that pt can afford the medication. CB # [phone]  [facility] on [address] closer to [facility]. Please call pt with questions.

## 2014-05-07 ENCOUNTER — Telehealth: Payer: Self-pay

## 2014-05-07 DIAGNOSIS — E119 Type 2 diabetes mellitus without complications: Secondary | ICD-10-CM

## 2014-05-07 NOTE — Telephone Encounter (Signed)
Patient's daughter is calling on behalf of patient wanting to know if there is a cheaper alternative for the insulin medication he is one. Please call his daughter Elmyra Ricks at [phone]

## 2014-05-07 NOTE — Telephone Encounter (Signed)
Pt will talk to pharm and call us back

## 2014-05-09 NOTE — Telephone Encounter (Signed)
insulin glargine (LANTUS) 100 UNIT/ML injection [id number]    Is there any cheaper alternative? I called the pharmacy and spoke with pharmacist. She states the Rx is written to inject 8 units per day which 1 vial will last him 120 days so the insurance is billing him 3 co-pays because it is being seen as a 90 day supply. For example, his co-pay would be cheaper if the RX was written 30 units daily. I hope this makes sense, if not come talk to me about this. I don't think the pt has many options. Please advise.Marland Kitchen

## 2014-05-09 NOTE — Telephone Encounter (Signed)
What about if we prescribed one vial, and instructions of "take as instructed".  Can he just fill one at a time for lower copay if we do it this way? If not, may need to look at 70/30 insulin that he would use twice per day.  It would have a short acting insulin in it, so would need to be careful of hypoglycemia. Let me know.  Thanks for checking into this.

## 2014-05-19 DIAGNOSIS — C61 Malignant neoplasm of prostate: Secondary | ICD-10-CM | POA: Diagnosis not present

## 2014-05-19 NOTE — Telephone Encounter (Signed)
Barbara, im stuck with this. Any suggestions. Im not sure what to do.

## 2014-05-19 NOTE — Telephone Encounter (Signed)
Dr Carlota Raspberry, when I spoke to the pharmacist (on 05/06/14 message) she couldn't see any options around him paying 3 co-pays, I asked her if we could word the Rx any differently. I know that on DM supplies, Medicare requires very specific instrs and sig can never just say "use as instructed". The 70/30 might be the only option. I believe that [facility] even has their own brand of Humalin (but not Humalog) that is VERY inexpensive (I think about $10 for a 10 ml vial). I will be glad to check on this if you think that Humalin would be appropriate.

## 2014-05-20 MED ORDER — INSULIN NPH ISOPHANE & REGULAR (70-30) 100 UNIT/ML ~~LOC~~ SUSP: 4.0000 [IU] | Freq: Two times a day (BID) | SUBCUTANEOUS | Status: DC

## 2014-05-20 NOTE — Telephone Encounter (Signed)
Thank you for looking into options. We can try 70/30 insulin twice per day - only 4 units BID. This is not much of a requirement, and borderline control last visit, so if diet control and weight changes may be enough to take him off insulin at next appt in about a month.  For now, can stop lantus, start 70/30 insulin at 4 units BID with meals, but check blood sugars more frequently (2-3 times per day) for first week on this regimen, and watch for any low blood sugar symptoms. If any questions - let me know.

## 2014-05-20 NOTE — Telephone Encounter (Signed)
Called pt to notify and he was very thankful, but reported that he went ahead and spent the money this time for the 90 day supply of Lantus. If he still needs the insulin after next visit, he would like to switch to the 70/30 then.

## 2014-05-26 DIAGNOSIS — C61 Malignant neoplasm of prostate: Secondary | ICD-10-CM | POA: Insufficient documentation

## 2014-06-13 ENCOUNTER — Encounter: Payer: Self-pay | Admitting: Family Medicine

## 2014-06-13 ENCOUNTER — Ambulatory Visit (INDEPENDENT_AMBULATORY_CARE_PROVIDER_SITE_OTHER): Payer: Commercial Managed Care - HMO | Admitting: Family Medicine

## 2014-06-13 VITALS — BP 144/62 | HR 77 | Temp 98.1°F | Resp 16 | Ht 69.25 in | Wt 210.4 lb

## 2014-06-13 DIAGNOSIS — S1081XA Abrasion of other specified part of neck, initial encounter: Secondary | ICD-10-CM | POA: Diagnosis not present

## 2014-06-13 DIAGNOSIS — L089 Local infection of the skin and subcutaneous tissue, unspecified: Secondary | ICD-10-CM | POA: Diagnosis not present

## 2014-06-13 MED ORDER — MUPIROCIN 2 % EX OINT: TOPICAL_OINTMENT | CUTANEOUS | Status: DC

## 2014-06-13 NOTE — Progress Notes (Signed)
Subjective:  This chart was scribed for [name], MD by Dellis Filbert, ED Scribe at [facility].The patient was seen in exam room 24 and the patient's care was started at 2:36 PM.   Patient ID: [name], male    DOB: [birth year], 81 y.o.   MRN: [id number] Chief Complaint  Patient presents with  . Rash    bump on the left eye x 4 days started draining this am   HPI HPI Comments: [name] is a 81 y.o. male with a history of diabetes who presents to [facility] for a follow up and a rash.   Rash: Onset one week ago, has a bump which appeared 3-4 days ago. He noticed some draining this morning. Pt states he was a friends house who had dogs. He believes the dog hair may have irritated his eye. Pt has been scratching his eye. He denies vision loss. No tingling in the nose or the face or any pain.  Diabetes: Last A1C at 03/28/2014 visit was 7.3.  Patient Active Problem List   Diagnosis Date Noted  . Pulsatile tinnitus 11/19/2013  . Rectal bleeding 12/23/2012  . Type II or unspecified type diabetes mellitus without mention of complication, not stated as uncontrolled   . Essential hypertension, benign   . Chronic back pain   . ED (erectile dysfunction)   . Anemia   . Hyperlipidemia   . CAD, NATIVE VESSEL 05/08/2008  . VENTRICULAR TACHYCARDIA 05/08/2008  . PREMATURE VENTRICULAR CONTRACTIONS 05/08/2008   Past Medical History  Diagnosis Date  . Type II or unspecified type diabetes mellitus without mention of complication, not stated as uncontrolled   . Essential hypertension, benign   . Chronic back pain   . ED (erectile dysfunction)   . Prostate cancer     s/p  . CAD (coronary artery disease)     pci to LAD 10/09; stable CAD by cath 05/31/08; myoview 04/11/11- no ishcemia, EF 67%; echo 05/15/09- EF>55%, mod calcification of the aortic valve leaflets  . Ventricular tachycardia     resuscitated; monitor 05/2008; attempted T ablation 2/10- aborted due to  inappropriate substrate  . Hyperlipidemia   . Allergy    Past Surgical History  Procedure Laterality Date  . Ptca  01/2008  . Colectomy  '80's    polyps  . Coronary stent placement  01/19/08    PCI stent to mid LAD with Promus DES 27.5x28  . Cardiac catheterization  05/31/08    EF 55-60%, stable lesions, medical therapy   Allergies  Allergen Reactions  . Niacin And Related     flushing   Prior to Admission medications   Medication Sig Start Date End Date Taking? Authorizing Provider  atorvastatin (LIPITOR) 10 MG tablet Take 1 tablet (10 mg total) by mouth daily. 02/21/14  Yes [name], MD  glucose blood (TRUETEST TEST) test strip USE AS DIRECTED THREE TIMES DAILY 06/21/13  Yes [name], MD  insulin NPH-regular Human (NOVOLIN 70/30) (70-30) 100 UNIT/ML injection Inject 4 Units into the skin 2 (two) times daily with a meal. 05/20/14  Yes [name], MD  Insulin Syringe-Needle U-100 (B-D INS SYR HALF-UNIT .3CC/31G) 31G X 5/16" 0.3 ML MISC Use daily at bedtime to inject insulin. Dx code: 250.00 06/21/13  Yes [name], MD  lisinopril (PRINIVIL,ZESTRIL) 20 MG tablet TAKE 1 TABLET (20 MG TOTAL) BY MOUTH DAILY. 02/21/14  Yes [name], MD  lisinopril-hydrochlorothiazide [facility]) 20-25  MG per tablet TAKE 1 TABLET BY MOUTH EVERY DAY 02/21/14  Yes [name], MD  metFORMIN (GLUCOPHAGE) 1000 MG tablet TAKE 1 TABLET (1,000 MG TOTAL) BY MOUTH 2 (TWO) TIMES DAILY WITH A MEAL. 02/21/14  Yes [name], MD  metoprolol (LOPRESSOR) 50 MG tablet Take 1 tablet (50 mg total) by mouth 2 (two) times daily. 02/21/14  Yes [name], MD  Multiple Vitamin (MULTIVITAMIN) tablet Take 1 tablet by mouth daily.   Yes Historical Provider, MD  sildenafil (VIAGRA) 25 MG tablet Take 1 tablet (25 mg total) by mouth daily as needed for erectile dysfunction. 02/08/14  Yes Lorretta Harp, MD  Vitamin D, Cholecalciferol, 1000 UNITS TABS Take by mouth daily.    Yes Historical Provider, MD  mupirocin ointment (BACTROBAN) 2 % Apply pea sized amount to wound 3 times per day for 1 week. 06/13/14   [name], MD   History   Social History  . Marital Status: Legally Separated    Spouse Name: [name]  . Number of Children: 7  . Years of Education: [name]   Occupational History  . RETIRED    Social History Main Topics  . Smoking status: Former Smoker -- 0.50 packs/day for 15 years    Types: Cigarettes    Quit date: 09/01/1990  . Smokeless tobacco: Not on file     Comment: quit 12/07/2013  . Alcohol Use: No  . Drug Use: No  . Sexual Activity: No   Other Topics Concern  . Not on file   Social History Narrative   29 grandchildren. Single. Education: 12 th grade. Exercise: 3-4 times a week for 30 minutes working out and set ups.   Review of Systems  Eyes: Positive for discharge, redness and itching. Negative for visual disturbance.      Objective:  BP 144/62 mmHg  Pulse 77  Temp(Src) 98.1 F (36.7 C) (Oral)  Resp 16  Ht 5' 9.25" (1.759 m)  Wt 210 lb 6.4 oz (95.437 kg)  BMI 30.85 kg/m2  SpO2 98%  Physical Exam  Constitutional: He is oriented to person, place, and time. He appears well-developed and well-nourished. No distress.  HENT:  Head: Normocephalic and atraumatic.  Eyes: Conjunctivae, EOM and lids are normal. Pupils are equal, round, and reactive to light. Right eye exhibits no discharge. Left eye exhibits no discharge. Right conjunctiva is not injected. Left conjunctiva is not injected.  Inferior to the left eye lid. He has a small erythematous patch with a central ulcer 7 mm, small scab superficially.  Neck: Normal range of motion.  Cardiovascular: Normal rate and regular rhythm.   Pulmonary/Chest: Effort normal. No respiratory distress.  Musculoskeletal: Normal range of motion.  Neurological: He is alert and oriented to person, place, and time.  Skin: Skin is warm and dry.  Psychiatric: He has a normal mood and affect. His  behavior is normal.  Nursing note and vitals reviewed.     Assessment & Plan:   [name] is a 81 y.o. male Abrasion, face with infection, initial encounter - Plan: mupirocin ointment (BACTROBAN) 2 % -suspected abrasion with early/mild infection. No vesicles or other rash noted, but advised to RTC if this occurs. Doubt zoster.   -bactroban topically tid for 1 week. rtc precautions.   Meds ordered this encounter  Medications  . mupirocin ointment (BACTROBAN) 2 %    Sig: Apply pea sized amount to wound 3 times per day for 1 week.    Dispense:  22 g    Refill:  0   Patient Instructions  Apply ointment to wound 3 times per day for 1 week. Avoid the actual eye. If redness increases or any new rashes - return for recheck right away.  Return to the clinic or go to the nearest emergency room if any of your symptoms worsen or new symptoms occur. Abrasion An abrasion is a cut or scrape of the skin. Abrasions do not extend through all layers of the skin and most heal within 10 days. It is important to care for your abrasion properly to prevent infection. CAUSES  Most abrasions are caused by falling on, or gliding across, the ground or other surface. When your skin rubs on something, the outer and inner layer of skin rubs off, causing an abrasion. DIAGNOSIS  Your caregiver will be able to diagnose an abrasion during a physical exam.  TREATMENT  Your treatment depends on how large and deep the abrasion is. Generally, your abrasion will be cleaned with water and a mild soap to remove any dirt or debris. An antibiotic ointment may be put over the abrasion to prevent an infection. A bandage (dressing) may be wrapped around the abrasion to keep it from getting dirty.  You may need a tetanus shot if:  You cannot remember when you had your last tetanus shot.  You have never had a tetanus shot.  The injury broke your skin. If you get a tetanus shot, your arm may swell, get red, and feel warm to  the touch. This is common and not a problem. If you need a tetanus shot and you choose not to have one, there is a rare chance of getting tetanus. Sickness from tetanus can be serious.  HOME CARE INSTRUCTIONS   If a dressing was applied, change it at least once a day or as directed by your caregiver. If the bandage sticks, soak it off with warm water.   Wash the area with water and a mild soap to remove all the ointment 2 times a day. Rinse off the soap and pat the area dry with a clean towel.   Reapply any ointment as directed by your caregiver. This will help prevent infection and keep the bandage from sticking. Use gauze over the wound and under the dressing to help keep the bandage from sticking.   Change your dressing right away if it becomes wet or dirty.   Only take over-the-counter or prescription medicines for pain, discomfort, or fever as directed by your caregiver.   Follow up with your caregiver within 24-48 hours for a wound check, or as directed. If you were not given a wound-check appointment, look closely at your abrasion for redness, swelling, or pus. These are signs of infection. SEEK IMMEDIATE MEDICAL CARE IF:   You have increasing pain in the wound.   You have redness, swelling, or tenderness around the wound.   You have pus coming from the wound.   You have a fever or persistent symptoms for more than 2-3 days.  You have a fever and your symptoms suddenly get worse.  You have a bad smell coming from the wound or dressing.  MAKE SURE YOU:   Understand these instructions.  Will watch your condition.  Will get help right away if you are not doing well or get worse. Document Released: 01/02/2005 Document Revised: 03/11/2012 Document Reviewed: 02/26/2011 [facility] Patient Information 2015 [place], [place]. This information is not intended to replace advice given to you by  your health care provider. Make sure you discuss any questions you have with your health  care provider.     I personally performed the services described in this documentation, which was scribed in my presence. The recorded information has been reviewed and considered, and addended by me as needed.

## 2014-06-13 NOTE — Patient Instructions (Signed)
Apply ointment to wound 3 times per day for 1 week. Avoid the actual eye. If redness increases or any new rashes - return for recheck right away.  Return to the clinic or go to the nearest emergency room if any of your symptoms worsen or new symptoms occur. Abrasion An abrasion is a cut or scrape of the skin. Abrasions do not extend through all layers of the skin and most heal within 10 days. It is important to care for your abrasion properly to prevent infection. CAUSES  Most abrasions are caused by falling on, or gliding across, the ground or other surface. When your skin rubs on something, the outer and inner layer of skin rubs off, causing an abrasion. DIAGNOSIS  Your caregiver will be able to diagnose an abrasion during a physical exam.  TREATMENT  Your treatment depends on how large and deep the abrasion is. Generally, your abrasion will be cleaned with water and a mild soap to remove any dirt or debris. An antibiotic ointment may be put over the abrasion to prevent an infection. A bandage (dressing) may be wrapped around the abrasion to keep it from getting dirty.  You may need a tetanus shot if:  You cannot remember when you had your last tetanus shot.  You have never had a tetanus shot.  The injury broke your skin. If you get a tetanus shot, your arm may swell, get red, and feel warm to the touch. This is common and not a problem. If you need a tetanus shot and you choose not to have one, there is a rare chance of getting tetanus. Sickness from tetanus can be serious.  HOME CARE INSTRUCTIONS   If a dressing was applied, change it at least once a day or as directed by your caregiver. If the bandage sticks, soak it off with warm water.   Wash the area with water and a mild soap to remove all the ointment 2 times a day. Rinse off the soap and pat the area dry with a clean towel.   Reapply any ointment as directed by your caregiver. This will help prevent infection and keep the bandage  from sticking. Use gauze over the wound and under the dressing to help keep the bandage from sticking.   Change your dressing right away if it becomes wet or dirty.   Only take over-the-counter or prescription medicines for pain, discomfort, or fever as directed by your caregiver.   Follow up with your caregiver within 24-48 hours for a wound check, or as directed. If you were not given a wound-check appointment, look closely at your abrasion for redness, swelling, or pus. These are signs of infection. SEEK IMMEDIATE MEDICAL CARE IF:   You have increasing pain in the wound.   You have redness, swelling, or tenderness around the wound.   You have pus coming from the wound.   You have a fever or persistent symptoms for more than 2-3 days.  You have a fever and your symptoms suddenly get worse.  You have a bad smell coming from the wound or dressing.  MAKE SURE YOU:   Understand these instructions.  Will watch your condition.  Will get help right away if you are not doing well or get worse. Document Released: 01/02/2005 Document Revised: 03/11/2012 Document Reviewed: 02/26/2011 [facility] Patient Information 2015 [place], [place]. This information is not intended to replace advice given to you by your health care provider. Make sure you discuss any questions you have with  your health care provider.

## 2014-06-27 ENCOUNTER — Ambulatory Visit (INDEPENDENT_AMBULATORY_CARE_PROVIDER_SITE_OTHER): Payer: Commercial Managed Care - HMO | Admitting: Family Medicine

## 2014-06-27 ENCOUNTER — Encounter: Payer: Self-pay | Admitting: Family Medicine

## 2014-06-27 VITALS — BP 125/70 | HR 95 | Temp 97.8°F | Resp 16 | Ht 69.0 in | Wt 208.6 lb

## 2014-06-27 DIAGNOSIS — E1165 Type 2 diabetes mellitus with hyperglycemia: Secondary | ICD-10-CM | POA: Diagnosis not present

## 2014-06-27 DIAGNOSIS — R350 Frequency of micturition: Secondary | ICD-10-CM

## 2014-06-27 DIAGNOSIS — IMO0002 Reserved for concepts with insufficient information to code with codable children: Secondary | ICD-10-CM

## 2014-06-27 DIAGNOSIS — I10 Essential (primary) hypertension: Secondary | ICD-10-CM | POA: Diagnosis not present

## 2014-06-27 DIAGNOSIS — E119 Type 2 diabetes mellitus without complications: Secondary | ICD-10-CM | POA: Diagnosis not present

## 2014-06-27 LAB — POCT URINALYSIS DIPSTICK
Bilirubin, UA: NEGATIVE
Blood, UA: NEGATIVE
Glucose, UA: NEGATIVE
Ketones, UA: NEGATIVE
Leukocytes, UA: NEGATIVE
Nitrite, UA: NEGATIVE
Protein, UA: 30
Spec Grav, UA: <=1.005
Urobilinogen, UA: 0.2
pH, UA: 5.5

## 2014-06-27 LAB — POCT UA - MICROSCOPIC ONLY
Bacteria, U Microscopic: NEGATIVE
Casts, Ur, LPF, POC: NEGATIVE
Crystals, Ur, HPF, POC: NEGATIVE
Mucus, UA: NEGATIVE
RBC, urine, microscopic: NEGATIVE
WBC, Ur, HPF, POC: NEGATIVE
Yeast, UA: NEGATIVE

## 2014-06-27 LAB — GLUCOSE, POCT (MANUAL RESULT ENTRY): POC Glucose: 130 mg/dl — AB (ref 70–99)

## 2014-06-27 LAB — POCT GLYCOSYLATED HEMOGLOBIN (HGB A1C): Hemoglobin A1C: 7.3

## 2014-06-27 MED ORDER — LISINOPRIL-HYDROCHLOROTHIAZIDE 20-12.5 MG PO TABS
1.0000 | ORAL_TABLET | Freq: Every day | ORAL | Status: DC
Start: 2014-06-27 — End: 2014-07-07

## 2014-06-27 NOTE — Patient Instructions (Addendum)
I lowered the dose of the hydrochlorothizide in your blood pressure medicine by half - now on combo of lisinopril 20mg  and hydrochlorothiazide 12.5mg  to lessen the urine frequency during the day. Keep a record of your blood pressures outside of the office and bring them to the next office visit. If your blood pressures increase over 140/90 - call me as we need to change meds again.  You should receive a call or letter about your lab results within the next week to 10 days.  Watch diet - diabetes near controlled. No changes in your medicines for now.  You should receive a call or letter about your lab results within the next week to 10 days.   Recheck in 3 months.

## 2014-06-27 NOTE — Progress Notes (Addendum)
Subjective:  This chart was scribed for Robert Ray, MD by [name], ED Scribe at [facility].The patient was seen in exam room 25 and the patient's care was started at 2:47 PM.  Authored by Robert Forehand, MD. Unable to change in [facility] after initially adjusting note.    Patient ID: [name], male    DOB: 1933/09/30, 81 y.o.   MRN: [id number] Chief Complaint  Patient presents with   Diabetes    3 month check up   Hyperlipidemia   HPI HPI Comments: [name] is a 81 y.o. male who presents to [facility] for a 3 month follow up for diabetes. He was last seen by me on 06/13/2014.  Diabetes: Last A1C at 03/28/2014 visit was 7.3.  He was continued on same medication His urine micro albumin was 14.6 at 03/28/2014 visit. He was continued on ACE-I. He was on lantus 8 units at night but this was cost prohibitive. He initially was recommended to switch  to insulin 70/30 units twice daily, however today pt states he wants to continue on the lantus. He was continued on metformin 1000 twice daily. Blood sugar at home range from 95-115  CAD, Hyperlipidemia: He is followed by Dr. Alvester Chou, with [facility] heart care. He was last seen by Dr. Alvester Chou 2 months ago. Status post stent in 2009. Last lipid panel in 12/27/2013 which was stable. He complains of ongoing increased urinary frequency. He was seen by his urologist 2 months and told his PSA levels were normal.  Lab Results  Component Value Date   CHOL 131 12/27/2013   HDL 40 12/27/2013   LDLCALC 64 12/27/2013   TRIG 137 12/27/2013   CHOLHDL 3.3 12/27/2013    Patient Active Problem List   Diagnosis Date Noted   Pulsatile tinnitus 11/19/2013   Rectal bleeding 12/23/2012   Type II or unspecified type diabetes mellitus without mention of complication, not stated as uncontrolled    Essential hypertension, benign    Chronic back pain    ED (erectile dysfunction)    Anemia    Hyperlipidemia      CAD, NATIVE VESSEL 05/08/2008   VENTRICULAR TACHYCARDIA 05/08/2008   PREMATURE VENTRICULAR CONTRACTIONS 05/08/2008   Past Medical History  Diagnosis Date   Type II or unspecified type diabetes mellitus without mention of complication, not stated as uncontrolled    Essential hypertension, benign    Chronic back pain    ED (erectile dysfunction)    Prostate cancer     s/p   CAD (coronary artery disease)     pci to LAD 10/09; stable CAD by cath 05/31/08; myoview 04/11/11- no ishcemia, EF 67%; echo 05/15/09- EF>55%, mod calcification of the aortic valve leaflets   Ventricular tachycardia     resuscitated; monitor 05/2008; attempted T ablation 2/10- aborted due to inappropriate substrate   Hyperlipidemia    Allergy    Past Surgical History  Procedure Laterality Date   Ptca  01/2008   Colectomy  '80's    polyps   Coronary stent placement  01/19/08    PCI stent to mid LAD with Promus DES 27.5x28   Cardiac catheterization  05/31/08    EF 55-60%, stable lesions, medical therapy   Allergies  Allergen Reactions   Niacin And Related     flushing   Prior to Admission medications   Medication Sig Start Date End Date Taking? Authorizing Provider  atorvastatin (LIPITOR) 10 MG tablet Take  1 tablet (10 mg total) by mouth daily. 02/21/14  Yes [name], MD  glucose blood (TRUETEST TEST) test strip USE AS DIRECTED THREE TIMES DAILY 06/21/13  Yes [name], MD  insulin NPH-regular Human (NOVOLIN 70/30) (70-30) 100 UNIT/ML injection Inject 4 Units into the skin 2 (two) times daily with a meal. 05/20/14  Yes [name], MD  Insulin Syringe-Needle U-100 (B-D INS SYR HALF-UNIT .3CC/31G) 31G X 5/16" 0.3 ML MISC Use daily at bedtime to inject insulin. Dx code: 250.00 06/21/13  Yes [name], MD  lisinopril (PRINIVIL,ZESTRIL) 20 MG tablet TAKE 1 TABLET (20 MG TOTAL) BY MOUTH DAILY. 02/21/14  Yes [name], MD  lisinopril-hydrochlorothiazide  (PRINZIDE,ZESTORETIC) 20-25 MG per tablet TAKE 1 TABLET BY MOUTH EVERY DAY 02/21/14  Yes [name], MD  metFORMIN (GLUCOPHAGE) 1000 MG tablet TAKE 1 TABLET (1,000 MG TOTAL) BY MOUTH 2 (TWO) TIMES DAILY WITH A MEAL. 02/21/14  Yes [name], MD  metoprolol (LOPRESSOR) 50 MG tablet Take 1 tablet (50 mg total) by mouth 2 (two) times daily. 02/21/14  Yes [name], MD  Multiple Vitamin (MULTIVITAMIN) tablet Take 1 tablet by mouth daily.   Yes Historical Provider, MD  mupirocin ointment (BACTROBAN) 2 % Apply pea sized amount to wound 3 times per day for 1 week. Patient not taking: Reported on 06/27/2014 06/13/14   [name], MD  sildenafil (VIAGRA) 25 MG tablet Take 1 tablet (25 mg total) by mouth daily as needed for erectile dysfunction. Patient not taking: Reported on 06/27/2014 02/08/14   Lorretta Harp, MD  Vitamin D, Cholecalciferol, 1000 UNITS TABS Take by mouth daily.   Yes Historical Provider, MD   History   Social History   Marital Status: Legally Separated    Spouse Name: [name]   Number of Children: 7   Years of Education: [name]   Occupational History   RETIRED    Social History Main Topics   Smoking status: Former Smoker -- 0.50 packs/day for 15 years    Types: Cigarettes    Quit date: 09/01/1990   Smokeless tobacco: Not on file     Comment: quit 12/07/2013   Alcohol Use: No   Drug Use: No   Sexual Activity: No   Other Topics Concern   Not on file   Social History Narrative   29 grandchildren. Single. Education: 12 th grade. Exercise: 3-4 times a week for 30 minutes working out and set ups.   Review of Systems  Constitutional: Negative for fatigue and unexpected weight change.  Eyes: Negative for visual disturbance.  Respiratory: Negative for cough, chest tightness and shortness of breath.   Cardiovascular: Negative for chest pain, palpitations and leg swelling.  Gastrointestinal: Negative for nausea, vomiting, abdominal pain and blood in  stool.  Genitourinary: Positive for frequency.  Musculoskeletal: Negative for back pain.  Neurological: Negative for dizziness, light-headedness and headaches.      Objective:  BP 125/70 mmHg   Pulse 95   Temp(Src) 97.8 F (36.6 C) (Oral)   Resp 16   Ht 5\' 9"  (1.753 m)   Wt 208 lb 9.6 oz (94.62 kg)   BMI 30.79 kg/m2   SpO2 98% Physical Exam  Constitutional: He is oriented to person, place, and time. He appears well-developed and well-nourished. No distress.  HENT:  Head: Normocephalic and atraumatic.  Eyes: EOM are normal. Pupils are equal, round, and reactive to light.  Neck: Normal range of motion. No JVD present.  Carotid bruit is not present.  Cardiovascular: Normal rate, regular rhythm and normal heart sounds.   No murmur heard. Pulmonary/Chest: Effort normal and breath sounds normal. No respiratory distress. He has no rales.  Musculoskeletal: Normal range of motion. He exhibits no edema.  Neurological: He is alert and oriented to person, place, and time.  Skin: Skin is warm and dry.  Psychiatric: He has a normal mood and affect. His behavior is normal.  Nursing note and vitals reviewed.  Results for orders placed or performed in visit on 06/27/14  POCT glucose (manual entry)  Result Value Ref Range   POC Glucose 130 (A) 70 - 99 mg/dl  POCT glycosylated hemoglobin (Hb A1C)  Result Value Ref Range   Hemoglobin A1C 7.3   POCT UA - Microscopic Only  Result Value Ref Range   WBC, Ur, HPF, POC neg    RBC, urine, microscopic neg    Bacteria, U Microscopic neg    Mucus, UA neg    Epithelial cells, urine per micros 0-1    Crystals, Ur, HPF, POC neg    Casts, Ur, LPF, POC neg    Yeast, UA neg   POCT urinalysis dipstick  Result Value Ref Range   Color, UA yellow    Clarity, UA clear    Glucose, UA neg    Bilirubin, UA neg    Ketones, UA neg    Spec Grav, UA <=1.005    Blood, UA neg    pH, UA 5.5    Protein, UA 30    Urobilinogen, UA 0.2    Nitrite, UA neg     Leukocytes, UA Negative       Assessment & Plan:   EVAGELOS CERNEY is a 81 y.o. male Type 2 diabetes mellitus, uncontrolled - Plan: HM Diabetes Foot Exam, POCT glucose (manual entry), POCT glycosylated hemoglobin (Hb A1C), Lipid panel, COMPLETE METABOLIC PANEL WITH GFR  -borderline control. Ok to continue same dosing of meds, but attention to diet and activity and exercise discussed. Also at age 8, would be cautious of too tight of control and risk of hypoglycemia. Recheck levels in 3 months.   Essential hypertension - Plan: lisinopril-hydrochlorothiazide (ZESTORETIC) 20-12.5 MG per tablet, Lipid panel, COMPLETE METABOLIC PANEL WITH GFR  - stable. With urinary symptoms, can try to decrease diuretic amount - decreassed HCTZ to 12.5mg  qd from 25mg . Watch BP control.   Urinary frequency - Plan: PSA, POCT UA - Microscopic Only, POCT urinalysis dipstick  -U/a normal in office. Reportedly recent prostate testing ok. Will repeat PSA. rtc if persistent or may need to discuss with urology.   Meds ordered this encounter  Medications   insulin glargine (LANTUS) 100 UNIT/ML injection    Sig: Inject 8 Units into the skin at bedtime.   lisinopril-hydrochlorothiazide (ZESTORETIC) 20-12.5 MG per tablet    Sig: Take 1 tablet by mouth daily.    Dispense:  90 tablet    Refill:  1   Patient Instructions  I lowered the dose of the hydrochlorothizide in your blood pressure medicine by half - now on combo of lisinopril 20mg  and hydrochlorothiazide 12.5mg  to lessen the urine frequency during the day. Keep a record of your blood pressures outside of the office and bring them to the next office visit. If your blood pressures increase over 140/90 - call me as we need to change meds again.  You should receive a call or letter about your lab results within the next week to 10 days.  Watch diet - diabetes near controlled. No changes in your medicines for now.  You should receive a call or letter about your lab  results within the next week to 10 days.   Recheck in 3 months.      I personally performed the services described in this documentation, which was scribed in my presence. The recorded information has been reviewed and considered, and addended by me as needed.

## 2014-06-28 ENCOUNTER — Telehealth: Payer: Self-pay

## 2014-06-28 LAB — LIPID PANEL
Cholesterol: 123 mg/dL (ref 0–200)
HDL: 45 mg/dL (ref >=40)
LDL Cholesterol: 52 mg/dL (ref 0–99)
Total CHOL/HDL Ratio: 2.7 Ratio
Triglycerides: 130 mg/dL (ref <150)
VLDL: 26 mg/dL (ref 0–40)

## 2014-06-28 LAB — COMPLETE METABOLIC PANEL WITH GFR
ALT: 12 U/L (ref 0–53)
AST: 15 U/L (ref 0–37)
Albumin: 4 g/dL (ref 3.5–5.2)
Alkaline Phosphatase: 36 U/L — ABNORMAL LOW (ref 39–117)
BUN: 20 mg/dL (ref 6–23)
CO2: 24 mEq/L (ref 19–32)
Calcium: 9.1 mg/dL (ref 8.4–10.5)
Chloride: 106 mEq/L (ref 96–112)
Creat: 1.16 mg/dL (ref 0.50–1.35)
GFR, Est African American: 69 mL/min
GFR, Est Non African American: 60 mL/min
Glucose, Bld: 121 mg/dL — ABNORMAL HIGH (ref 70–99)
Potassium: 4.1 mEq/L (ref 3.5–5.3)
Sodium: 138 mEq/L (ref 135–145)
Total Bilirubin: 0.6 mg/dL (ref 0.2–1.2)
Total Protein: 6.5 g/dL (ref 6.0–8.3)

## 2014-06-28 LAB — PSA: PSA: 0.32 ng/mL (ref <=4.00)

## 2014-07-06 ENCOUNTER — Other Ambulatory Visit: Payer: Self-pay | Admitting: Family Medicine

## 2014-07-07 ENCOUNTER — Other Ambulatory Visit: Payer: Self-pay

## 2014-07-07 DIAGNOSIS — I10 Essential (primary) hypertension: Secondary | ICD-10-CM

## 2014-07-07 MED ORDER — LISINOPRIL-HYDROCHLOROTHIAZIDE 20-12.5 MG PO TABS: 1.0000 | ORAL_TABLET | Freq: Every day | ORAL | Status: DC

## 2014-09-09 ENCOUNTER — Telehealth: Payer: Self-pay | Admitting: *Deleted

## 2014-09-09 ENCOUNTER — Other Ambulatory Visit: Payer: Self-pay | Admitting: *Deleted

## 2014-09-09 MED ORDER — INSULIN NPH ISOPHANE & REGULAR (70-30) 100 UNIT/ML ~~LOC~~ SUSP: 4.0000 [IU] | Freq: Two times a day (BID) | SUBCUTANEOUS | Status: DC

## 2014-09-09 NOTE — Telephone Encounter (Signed)
Pt came into office today for refill on insulin and is willing to try the different insulin 70/30 4units BID.  Rx sent in.  Advised pt to check blood glucose 2-3 times a day and to watch out for low sugars.

## 2014-09-13 ENCOUNTER — Other Ambulatory Visit: Payer: Self-pay | Admitting: Radiology

## 2014-09-13 MED ORDER — INSULIN NPH ISOPHANE & REGULAR (70-30) 100 UNIT/ML ~~LOC~~ SUSP: 4.0000 [IU] | Freq: Two times a day (BID) | SUBCUTANEOUS | Status: DC

## 2014-10-03 ENCOUNTER — Telehealth: Payer: Self-pay

## 2014-10-03 ENCOUNTER — Ambulatory Visit (INDEPENDENT_AMBULATORY_CARE_PROVIDER_SITE_OTHER): Payer: Commercial Managed Care - HMO | Admitting: Family Medicine

## 2014-10-03 ENCOUNTER — Encounter: Payer: Self-pay | Admitting: Family Medicine

## 2014-10-03 VITALS — BP 136/72 | HR 70 | Temp 98.2°F | Resp 16 | Ht 69.5 in | Wt 213.4 lb

## 2014-10-03 DIAGNOSIS — E785 Hyperlipidemia, unspecified: Secondary | ICD-10-CM

## 2014-10-03 DIAGNOSIS — I10 Essential (primary) hypertension: Secondary | ICD-10-CM

## 2014-10-03 DIAGNOSIS — E119 Type 2 diabetes mellitus without complications: Secondary | ICD-10-CM

## 2014-10-03 DIAGNOSIS — I499 Cardiac arrhythmia, unspecified: Secondary | ICD-10-CM

## 2014-10-03 DIAGNOSIS — Z8546 Personal history of malignant neoplasm of prostate: Secondary | ICD-10-CM

## 2014-10-03 LAB — POCT GLYCOSYLATED HEMOGLOBIN (HGB A1C): Hemoglobin A1C: 7.5

## 2014-10-03 LAB — GLUCOSE, POCT (MANUAL RESULT ENTRY): POC Glucose: 116 mg/dl — AB (ref 70–99)

## 2014-10-03 MED ORDER — LISINOPRIL 20 MG PO TABS: ORAL_TABLET | ORAL | Status: DC

## 2014-10-03 MED ORDER — METOPROLOL TARTRATE 50 MG PO TABS: 50.0000 mg | ORAL_TABLET | Freq: Two times a day (BID) | ORAL | Status: DC

## 2014-10-03 MED ORDER — ATORVASTATIN CALCIUM 10 MG PO TABS: 10.0000 mg | ORAL_TABLET | Freq: Every day | ORAL | Status: DC

## 2014-10-03 MED ORDER — LISINOPRIL-HYDROCHLOROTHIAZIDE 20-12.5 MG PO TABS: 1.0000 | ORAL_TABLET | Freq: Every day | ORAL | Status: DC

## 2014-10-03 NOTE — Progress Notes (Signed)
Subjective:  This chart was scribed for Robert Agreste, MD by [name], at [facility].  This patient was seen in room 22 and the patient's care was started at 2:08 PM.    Patient ID: [name], male    DOB: April 04, 1934, 81 y.o.   MRN: [id number]  HPI  HPI Comments: [name] is a 81 y.o. male who presents to the [facility] for a follow up as well as medication refills. Last visit was march 21 st.  Patient sees his cardiologist once every year. He is compliant with his Lipitor, vitamins and fish oil pills.  He denies any decrease in sexual drive.  Patient just got married recently this past week.     Diabetes:  History of elevated micro albumin at 14.6 in December 2015.  He was continued on Ace inhibitor.  See previous notes regarding lantis and insulin dosing.Takes lantis qhs, Metformin 100 mg bid. Borderline diabetic control last at visit but at his age we were planning on being cautious with too tight of control and his risk of hypoglycemia.  Recommended diet and activity modification with same dose of medications. Phone note reviewed from 3 weeks ago where changed from lantis to 70/40 units of insulin BID   Filed Weights   10/03/14 1355  Weight: 213 lb 6.4 oz (96.798 kg)  His weight at last visit was 208 in march and 209 in December of last year.   Lab Results  Component Value Date   HGBA1C 7.3 06/27/2014  ------ Patient states that he does not have any symptoms and his blood sugar has been ranging  90-120's.  He is complaint with his metformin and denies any new side effects from his medication.  Patient has been complaint with seeing his eye doctor and did not have any issues to be concerned of.     Hypertension:  Controlled at last visit at 125/70.  Due to some urinary symptoms we tried to decrease his HCTZ to half Dose at 12.5 mg's each day.  He has a history of CAD followed by Dr. [name] with [facility].   History of PTCA with stent in 2009. He has a history of PVC. Lab Results  Component Value Date   CREATININE 1.16 06/27/2014   Lab Results  Component Value Date   CHOL 123 06/27/2014   HDL 45 06/27/2014   LDLCALC 52 06/27/2014   TRIG 130 06/27/2014   CHOLHDL 2.7 06/27/2014  ----- Patient denies any chest pains/ difficulty breathing and will see Dr. [name] in November.     Urinary Symptoms:  He is followed by urology.  Repeat PSA was drawn.  Lab Results  Component Value Date   PSA 0.32 06/27/2014  ------ Patient drinks a lot of coffee in the morning and states that he doesn't have to urinate as much as he used to.  Patient had prostate cancer about 6-7 years ago and had radiation.  He last saw his urologist close to 5 months ago.  He states that his PSA is back down to normal range. He only wakes up once a night and doesn't wake up at all some nights.      Patient Active Problem List   Diagnosis Date Noted  . Pulsatile tinnitus 11/19/2013  . Rectal bleeding 12/23/2012  . Type II or unspecified type diabetes mellitus without mention of complication, not stated as uncontrolled   . Essential hypertension, benign   .  Chronic back pain   . ED (erectile dysfunction)   . Anemia   . Hyperlipidemia   . CAD, NATIVE VESSEL 05/08/2008  . VENTRICULAR TACHYCARDIA 05/08/2008  . PREMATURE VENTRICULAR CONTRACTIONS 05/08/2008   Past Medical History  Diagnosis Date  . Type II or unspecified type diabetes mellitus without mention of complication, not stated as uncontrolled   . Essential hypertension, benign   . Chronic back pain   . ED (erectile dysfunction)   . Prostate cancer     s/p  . CAD (coronary artery disease)     pci to LAD 10/09; stable CAD by cath 05/31/08; myoview 04/11/11- no ishcemia, EF 67%; echo 05/15/09- EF>55%, mod calcification of the aortic valve leaflets  . Ventricular tachycardia     resuscitated; monitor 05/2008; attempted T ablation 2/10- aborted due to inappropriate  substrate  . Hyperlipidemia   . Allergy    Past Surgical History  Procedure Laterality Date  . Ptca  01/2008  . Colectomy  '80's    polyps  . Coronary stent placement  01/19/08    PCI stent to mid LAD with Promus DES 27.5x28  . Cardiac catheterization  05/31/08    EF 55-60%, stable lesions, medical therapy   Allergies  Allergen Reactions  . Niacin And Related     flushing   Prior to Admission medications   Medication Sig Start Date End Date Taking? Authorizing Provider  atorvastatin (LIPITOR) 10 MG tablet TAKE 1 TABLET EVERY DAY 07/06/14   Robert Agreste, MD  glucose blood (TRUETEST TEST) test strip USE AS DIRECTED THREE TIMES DAILY 06/21/13   Robert Agreste, MD  insulin NPH-regular Human (NOVOLIN 70/30) (70-30) 100 UNIT/ML injection Inject 4 Units into the skin 2 (two) times daily with a meal. 09/13/14   Robert Agreste, MD  Insulin Syringe-Needle U-100 (B-D INS SYR HALF-UNIT .3CC/31G) 31G X 5/16" 0.3 ML MISC Use daily at bedtime to inject insulin. Dx code: 250.00 06/21/13   Robert Agreste, MD  lisinopril (PRINIVIL,ZESTRIL) 20 MG tablet TAKE 1 TABLET (20 MG TOTAL) BY MOUTH DAILY. 02/21/14   Robert Agreste, MD  lisinopril-hydrochlorothiazide (ZESTORETIC) 20-12.5 MG per tablet Take 1 tablet by mouth daily. 07/07/14   Dorian Heckle English, PA  metFORMIN (GLUCOPHAGE) 1000 MG tablet TAKE 1 TABLET TWICE DAILY WITH MEALS 07/06/14   Robert Agreste, MD  metoprolol (LOPRESSOR) 50 MG tablet TAKE 1 TABLET TWICE DAILY 07/06/14   Robert Agreste, MD  Multiple Vitamin (MULTIVITAMIN) tablet Take 1 tablet by mouth daily.    Historical Provider, MD  Vitamin D, Cholecalciferol, 1000 UNITS TABS Take by mouth daily.    Historical Provider, MD   History   Social History  . Marital Status: Legally Separated    Spouse Name: [name]  . Number of Children: 7  . Years of Education: [name]   Occupational History  . RETIRED    Social History Main Topics  . Smoking status: Former Smoker -- 0.50 packs/day  for 15 years    Types: Cigarettes    Quit date: 09/01/1990  . Smokeless tobacco: Not on file     Comment: quit 12/07/2013  . Alcohol Use: No  . Drug Use: No  . Sexual Activity: No   Other Topics Concern  . Not on file   Social History Narrative   29 grandchildren. Single. Education: 12 th grade. Exercise: 3-4 times a week for 30 minutes working out and set ups.        Review of  Systems  Constitutional: Negative for fatigue and unexpected weight change.  Eyes: Negative for visual disturbance.  Respiratory: Negative for cough, chest tightness and shortness of breath.   Cardiovascular: Negative for chest pain, palpitations and leg swelling.  Gastrointestinal: Negative for abdominal pain and blood in stool.  Neurological: Negative for dizziness, light-headedness and headaches.       Objective:   Physical Exam  Constitutional: He is oriented to person, place, and time. He appears well-developed and well-nourished.  HENT:  Head: Normocephalic and atraumatic.  Eyes: EOM are normal. Pupils are equal, round, and reactive to light.  Neck: No JVD present. Carotid bruit is not present.  Cardiovascular: Regular rhythm and normal heart sounds.   He has frequent ectopic beat but normal rhythm.   Pulmonary/Chest: Effort normal and breath sounds normal. He has no rales.  Musculoskeletal: He exhibits no edema.  Neurological: He is alert and oriented to person, place, and time.  Skin: Skin is warm and dry.  Psychiatric: He has a normal mood and affect.  Vitals reviewed.   Filed Vitals:   10/03/14 1355  BP: 136/72  Pulse: 70  Temp: 98.2 F (36.8 C)  TempSrc: Oral  Resp: 16  Height: 5' 9.5" (1.765 m)  Weight: 213 lb 6.4 oz (96.798 kg)  SpO2: 99%   Lab Results  Component Value Date   HGBA1C 7.5 10/03/2014    Results for orders placed or performed in visit on 10/03/14  POCT glycosylated hemoglobin (Hb A1C)  Result Value Ref Range   Hemoglobin A1C 7.5   POCT glucose (manual  entry)  Result Value Ref Range   POC Glucose 116 (A) 70 - 99 mg/dl    EKG: sinus rhythm with rate variation, total rate 67, no acute findings.     Assessment & Plan:   [name] is a 81 y.o. male Essential hypertension - Plan: lisinopril (PRINIVIL,ZESTRIL) 20 MG tablet, DISCONTINUED: metoprolol (LOPRESSOR) 50 MG tablet, DISCONTINUED: lisinopril-hydrochlorothiazide (ZESTORETIC) 20-12.5 MG per tablet, DISCONTINUED: lisinopril (PRINIVIL,ZESTRIL) 20 MG tablet, DISCONTINUED: lisinopril (PRINIVIL,ZESTRIL) 20 MG tablet   -Overall stable. Continue same dose of lisinopril 20 mg and lisinopril/HCTZ 20/12.5 mg daily, as well as same dose of metoprolol.   Type 2 diabetes mellitus without complication - Plan: HM Diabetes Foot Exam, POCT glycosylated hemoglobin (Hb A1C), POCT glucose (manual entry)  - Not at ideal control but at his age will leave medications the same to lessen chance of symptomatic hypoglycemia with some readings under 100. Discussed diet again continue same doses of medications at this point and recheck levels in 3 months.    History of prostate cancer  - Urinary symptoms improved, last PSA okay. Continue follow-up routinely with urology.  Hyperlipidemia - Plan: DISCONTINUED: atorvastatin (LIPITOR) 10 MG tablet  -Prior lipids okay. Continue same dose of Lipitor 10 mg daily.   Irregular heart beat - Plan: EKG 12-Lead  -History of PVCs, no acute findings seen on EKG today, continue regular follow-up with cardiology as planned.  Meds ordered this encounter  Medications  . DISCONTD: metoprolol (LOPRESSOR) 50 MG tablet    Sig: Take 1 tablet (50 mg total) by mouth 2 (two) times daily.    Dispense:  180 tablet    Refill:  1  . DISCONTD: lisinopril-hydrochlorothiazide (ZESTORETIC) 20-12.5 MG per tablet    Sig: Take 1 tablet by mouth daily.    Dispense:  90 tablet    Refill:  1  . DISCONTD: lisinopril (PRINIVIL,ZESTRIL) 20 MG tablet    Sig: TAKE  1 TABLET (20 MG TOTAL) BY MOUTH  DAILY.    Dispense:  90 tablet    Refill:  1  . DISCONTD: atorvastatin (LIPITOR) 10 MG tablet    Sig: Take 1 tablet (10 mg total) by mouth daily.    Dispense:  90 tablet    Refill:  1  . DISCONTD: lisinopril (PRINIVIL,ZESTRIL) 20 MG tablet    Sig: TAKE 1 TABLET (20 MG TOTAL) BY MOUTH DAILY.    Dispense:  30 tablet    Refill:  0  . lisinopril (PRINIVIL,ZESTRIL) 20 MG tablet    Sig: TAKE 1 TABLET (20 MG TOTAL) BY MOUTH DAILY.    Dispense:  30 tablet    Refill:  0   Patient Instructions  Blood sugar still not quite at goal, but can continue meds at same doses for now as you have had some readings under 100. Continue to watch diet to keep weight down.  You should receive a call or letter about your lab results within the next week to 10 days.   Follow up with me in 3 months.     I personally performed the services described in this documentation, which was scribed in my presence. The recorded information has been reviewed and considered, and addended by me as needed.

## 2014-10-03 NOTE — Telephone Encounter (Signed)
Patient was just seen today and is calling because he requested that his medication be sent to [facility] but we sent it to [facility]. He states that this has happened before. Patient phone: [phone]

## 2014-10-03 NOTE — Patient Instructions (Addendum)
Blood sugar still not quite at goal, but can continue meds at same doses for now as you have had some readings under 100. Continue to watch diet to keep weight down.  You should receive a call or letter about your lab results within the next week to 10 days.   Follow up with me in 3 months.

## 2014-10-03 NOTE — Telephone Encounter (Signed)
Rx's sent to [facility]. Pt notified.

## 2014-10-19 DIAGNOSIS — E291 Testicular hypofunction: Secondary | ICD-10-CM | POA: Diagnosis not present

## 2014-10-19 DIAGNOSIS — C61 Malignant neoplasm of prostate: Secondary | ICD-10-CM | POA: Diagnosis not present

## 2014-10-26 DIAGNOSIS — N529 Male erectile dysfunction, unspecified: Secondary | ICD-10-CM | POA: Diagnosis not present

## 2014-10-26 DIAGNOSIS — C61 Malignant neoplasm of prostate: Secondary | ICD-10-CM | POA: Diagnosis not present

## 2014-12-05 ENCOUNTER — Other Ambulatory Visit: Payer: Self-pay | Admitting: Family Medicine

## 2015-01-02 ENCOUNTER — Ambulatory Visit (INDEPENDENT_AMBULATORY_CARE_PROVIDER_SITE_OTHER): Payer: Commercial Managed Care - HMO | Admitting: Family Medicine

## 2015-01-02 ENCOUNTER — Encounter: Payer: Self-pay | Admitting: Family Medicine

## 2015-01-02 VITALS — BP 159/74 | HR 68 | Temp 98.4°F | Resp 16 | Ht 70.0 in | Wt 218.0 lb

## 2015-01-02 DIAGNOSIS — Z23 Encounter for immunization: Secondary | ICD-10-CM | POA: Diagnosis not present

## 2015-01-02 DIAGNOSIS — E119 Type 2 diabetes mellitus without complications: Secondary | ICD-10-CM | POA: Diagnosis not present

## 2015-01-02 DIAGNOSIS — E785 Hyperlipidemia, unspecified: Secondary | ICD-10-CM

## 2015-01-02 DIAGNOSIS — I10 Essential (primary) hypertension: Secondary | ICD-10-CM | POA: Diagnosis not present

## 2015-01-02 LAB — COMPREHENSIVE METABOLIC PANEL
ALT: 13 U/L (ref 9–46)
AST: 13 U/L (ref 10–35)
Albumin: 3.8 g/dL (ref 3.6–5.1)
Alkaline Phosphatase: 42 U/L (ref 40–115)
BUN: 18 mg/dL (ref 7–25)
CO2: 24 mmol/L (ref 20–31)
Calcium: 9.3 mg/dL (ref 8.6–10.3)
Chloride: 104 mmol/L (ref 98–110)
Creat: 1.25 mg/dL — ABNORMAL HIGH (ref 0.70–1.18)
Glucose, Bld: 141 mg/dL — ABNORMAL HIGH (ref 65–99)
Potassium: 4.7 mmol/L (ref 3.5–5.3)
Sodium: 138 mmol/L (ref 135–146)
Total Bilirubin: 0.4 mg/dL (ref 0.2–1.2)
Total Protein: 6.2 g/dL (ref 6.1–8.1)

## 2015-01-02 LAB — LIPID PANEL
Cholesterol: 126 mg/dL (ref 125–200)
HDL: 42 mg/dL (ref >=40)
LDL Cholesterol: 56 mg/dL (ref <130)
Total CHOL/HDL Ratio: 3 Ratio (ref <=5.0)
Triglycerides: 140 mg/dL (ref <150)
VLDL: 28 mg/dL (ref <30)

## 2015-01-02 LAB — GLUCOSE, POCT (MANUAL RESULT ENTRY): POC Glucose: 158 mg/dl — AB (ref 70–99)

## 2015-01-02 LAB — POCT GLYCOSYLATED HEMOGLOBIN (HGB A1C): Hemoglobin A1C: 8

## 2015-01-02 NOTE — Patient Instructions (Addendum)
Blood sugars running higher than last visit. Goal hemoglobin A1c less than 7.  Increase insulin to 5 units twice per day, but watch for any low blood sugar symptoms as below. If these occur - return to discuss further.   Blood pressure also running a little higher today.  Your readings were better last visit than today. Keep a record of your blood pressures outside of the office and if remaining over 140/90 - let me know or cardiologist to determine if medication adjustment needed.   You should receive a call or letter about your lab results within the next week to 10 days.   Hypoglycemia Hypoglycemia occurs when the glucose in your blood is too low. Glucose is a type of sugar that is your body's main energy source. Hormones, such as insulin and glucagon, control the level of glucose in the blood. Insulin lowers blood glucose and glucagon increases blood glucose. Having too much insulin in your blood stream, or not eating enough food containing sugar, can result in hypoglycemia. Hypoglycemia can happen to people with or without diabetes. It can develop quickly and can be a medical emergency.  CAUSES   Missing or delaying meals.  Not eating enough carbohydrates at meals.  Taking too much diabetes medicine.  Not timing your oral diabetes medicine or insulin doses with meals, snacks, and exercise.  Nausea and vomiting.  Certain medicines.  Severe illnesses, such as hepatitis, kidney disorders, and certain eating disorders.  Increased activity or exercise without eating something extra or adjusting medicines.  Drinking too much alcohol.  A nerve disorder that affects body functions like your heart rate, blood pressure, and digestion (autonomic neuropathy).  A condition where the stomach muscles do not function properly (gastroparesis). Therefore, medicines and food may not absorb properly.  Rarely, a tumor of the pancreas can produce too much insulin. SYMPTOMS   Hunger.  Sweating  (diaphoresis).  Change in body temperature.  Shakiness.  Headache.  Anxiety.  Lightheadedness.  Irritability.  Difficulty concentrating.  Dry mouth.  Tingling or numbness in the hands or feet.  Restless sleep or sleep disturbances.  Altered speech and coordination.  Change in mental status.  Seizures or prolonged convulsions.  Combativeness.  Drowsiness (lethargic).  Weakness.  Increased heart rate or palpitations.  Confusion.  Pale, gray skin color.  Blurred or double vision.  Fainting. DIAGNOSIS  A physical exam and medical history will be performed. Your caregiver may make a diagnosis based on your symptoms. Blood tests and other lab tests may be performed to confirm a diagnosis. Once the diagnosis is made, your caregiver will see if your signs and symptoms go away once your blood glucose is raised.  TREATMENT  Usually, you can easily treat your hypoglycemia when you notice symptoms.  Check your blood glucose. If it is less than 70 mg/dl, take one of the following:   3-4 glucose tablets.    cup juice.    cup regular soda.   1 cup skim milk.   -1 tube of glucose gel.   5-6 hard candies.   Avoid high-fat drinks or food that may delay a rise in blood glucose levels.  Do not take more than the recommended amount of sugary foods, drinks, gel, or tablets. Doing so will cause your blood glucose to go too high.   Wait 10-15 minutes and recheck your blood glucose. If it is still less than 70 mg/dl or below your target range, repeat treatment.   Eat a snack if it is  more than 1 hour until your next meal.  There may be a time when your blood glucose may go so low that you are unable to treat yourself at home when you start to notice symptoms. You may need someone to help you. You may even faint or be unable to swallow. If you cannot treat yourself, someone will need to bring you to the hospital.  [place]  If you have  diabetes, follow your diabetes management plan by:  Taking your medicines as directed.  Following your exercise plan.  Following your meal plan. Do not skip meals. Eat on time.  Testing your blood glucose regularly. Check your blood glucose before and after exercise. If you exercise longer or different than usual, be sure to check blood glucose more frequently.  Wearing your medical alert jewelry that says you have diabetes.  Identify the cause of your hypoglycemia. Then, develop ways to prevent the recurrence of hypoglycemia.  Do not take a hot bath or shower right after an insulin shot.  Always carry treatment with you. Glucose tablets are the easiest to carry.  If you are going to drink alcohol, drink it only with meals.  Tell friends or family members ways to keep you safe during a seizure. This may include removing hard or sharp objects from the area or turning you on your side.  Maintain a healthy weight. SEEK MEDICAL CARE IF:   You are having problems keeping your blood glucose in your target range.  You are having frequent episodes of hypoglycemia.  You feel you might be having side effects from your medicines.  You are not sure why your blood glucose is dropping so low.  You notice a change in vision or a new problem with your vision. SEEK IMMEDIATE MEDICAL CARE IF:   Confusion develops.  A change in mental status occurs.  The inability to swallow develops.  Fainting occurs. Document Released: 03/25/2005 Document Revised: 03/30/2013 Document Reviewed: 07/22/2011 [facility] Patient Information 2015 [place], [place]. This information is not intended to replace advice given to you by your health care provider. Make sure you discuss any questions you have with your health care provider.

## 2015-01-02 NOTE — Progress Notes (Addendum)
Subjective:    Patient ID: [name], male    DOB: 10/04/1933, 81 y.o.   MRN: [id number] This chart was scribed for [name], MD by [name], Medical Scribe. This patient was seen in Room 23 and the patient's care was started at 2:40 PM.    HPI HPI Comments: [name] is a 81 y.o. male with a history of hyperlipidemia, hypertension, and DM who presents to the [facility] for a follow-up. Patient has been doing well and does not have any specific complaints today.  Diabetes: He had had episodic readings under 100 so was maintained under same medication doses. Stressed importance of diet. He has been getting blood sugar readings usually in the 90s/100s and sometimes gets in the 110s. Patient denies symptomatic lows. He has not had any readings over 150. He had to stop aspirin use in the past due to rectal bleeding; he has not had any rectal bleeding since then.  Lab Results  Component Value Date   HGBA1C 7.5 10/03/2014    Wt Readings from Last 3 Encounters:  01/02/15 218 lb (98.884 kg)  10/03/14 213 lb 6.4 oz (96.798 kg)  06/27/14 208 lb 9.6 oz (94.62 kg)    Hypertension: Stable at last visit 3 months ago. Followed by Dr. [name] with [facility] for CAD.  Lab Results  Component Value Date   CREATININE 1.16 06/27/2014    Hyperlipidemia: Stable on Lipitor 10 mg qd.  Lab Results  Component Value Date   CHOL 123 06/27/2014   HDL 45 06/27/2014   LDLCALC 52 06/27/2014   TRIG 130 06/27/2014   CHOLHDL 2.7 06/27/2014    Health Maintenance:  Immunization History  Administered Date(s) Administered  . Influenza,inj,Quad PF,36+ Mos 01/04/2013, 12/27/2013, 01/02/2015  . Pneumococcal Conjugate-13 12/27/2013  . Pneumococcal Polysaccharide-23 01/04/2013  . Td 04/08/2009    Patient Active Problem List   Diagnosis Date Noted  . Pulsatile tinnitus 11/19/2013  . Rectal bleeding 12/23/2012  . Type II or unspecified type diabetes mellitus without  mention of complication, not stated as uncontrolled   . Essential hypertension, benign   . Chronic back pain   . ED (erectile dysfunction)   . Anemia   . Hyperlipidemia   . CAD, NATIVE VESSEL 05/08/2008  . VENTRICULAR TACHYCARDIA 05/08/2008  . PREMATURE VENTRICULAR CONTRACTIONS 05/08/2008   Past Medical History  Diagnosis Date  . Type II or unspecified type diabetes mellitus without mention of complication, not stated as uncontrolled   . Essential hypertension, benign   . Chronic back pain   . ED (erectile dysfunction)   . Prostate cancer     s/p  . CAD (coronary artery disease)     pci to LAD 10/09; stable CAD by cath 05/31/08; myoview 04/11/11- no ishcemia, EF 67%; echo 05/15/09- EF>55%, mod calcification of the aortic valve leaflets  . Ventricular tachycardia     resuscitated; monitor 05/2008; attempted T ablation 2/10- aborted due to inappropriate substrate  . Hyperlipidemia   . Allergy    Past Surgical History  Procedure Laterality Date  . Ptca  01/2008  . Colectomy  '80's    polyps  . Coronary stent placement  01/19/08    PCI stent to mid LAD with Promus DES 27.5x28  . Cardiac catheterization  05/31/08    EF 55-60%, stable lesions, medical therapy   Allergies  Allergen Reactions  . Niacin And Related     flushing   Prior to Admission medications  Medication Sig Start Date End Date Taking? Authorizing Provider  atorvastatin (LIPITOR) 10 MG tablet Take 1 tablet (10 mg total) by mouth daily. 10/03/14  Yes [name], MD  glucose blood (TRUETEST TEST) test strip USE AS DIRECTED THREE TIMES DAILY 06/21/13  Yes [name], MD  insulin NPH-regular Human (NOVOLIN 70/30) (70-30) 100 UNIT/ML injection Inject 4 Units into the skin 2 (two) times daily with a meal. 09/13/14  Yes [name], MD  Insulin Syringe-Needle U-100 (B-D INS SYR HALF-UNIT .3CC/31G) 31G X 5/16" 0.3 ML MISC Use daily at bedtime to inject insulin. Dx code: 250.00 06/21/13  Yes [name], MD    lisinopril (PRINIVIL,ZESTRIL) 20 MG tablet TAKE 1 TABLET EVERY DAY 12/06/14  Yes Chelle Jeffery, PA-C  lisinopril-hydrochlorothiazide (ZESTORETIC) 20-12.5 MG per tablet Take 1 tablet by mouth daily. 10/03/14  Yes [name], MD  metFORMIN (GLUCOPHAGE) 1000 MG tablet TAKE 1 TABLET TWICE DAILY WITH MEALS 12/06/14  Yes Chelle Jeffery, PA-C  metoprolol (LOPRESSOR) 50 MG tablet Take 1 tablet (50 mg total) by mouth 2 (two) times daily. 10/03/14  Yes [name], MD  Multiple Vitamin (MULTIVITAMIN) tablet Take 1 tablet by mouth daily.   Yes Historical Provider, MD  Vitamin D, Cholecalciferol, 1000 UNITS TABS Take by mouth daily.   Yes Historical Provider, MD   Social History   Social History  . Marital Status: Legally Separated    Spouse Name: [name]  . Number of Children: 7  . Years of Education: [name]   Occupational History  . RETIRED    Social History Main Topics  . Smoking status: Former Smoker -- 0.50 packs/day for 15 years    Types: Cigarettes    Quit date: 09/01/1990  . Smokeless tobacco: Not on file     Comment: quit 12/07/2013  . Alcohol Use: No  . Drug Use: No  . Sexual Activity: No   Other Topics Concern  . Not on file   Social History Narrative   29 grandchildren. Single. Education: 12 th grade. Exercise: 3-4 times a week for 30 minutes working out and set ups.     Review of Systems  Constitutional: Negative for fatigue and unexpected weight change.  Eyes: Negative for visual disturbance.  Respiratory: Negative for cough, chest tightness and shortness of breath.   Cardiovascular: Negative for chest pain, palpitations and leg swelling.  Gastrointestinal: Negative for abdominal pain, blood in stool and anal bleeding.  Neurological: Negative for dizziness, light-headedness and headaches.       Objective:   Physical Exam  Constitutional: He is oriented to person, place, and time. He appears well-developed and well-nourished.  HENT:  Head: Normocephalic and  atraumatic.  Eyes: EOM are normal. Pupils are equal, round, and reactive to light.  Neck: No JVD present. Carotid bruit is not present.  Cardiovascular: Normal rate, regular rhythm and normal heart sounds.   No murmur heard. Regular rate with episodic ectopic beat. No murmur appreciated.  Pulmonary/Chest: Effort normal and breath sounds normal. He has no rales.  Musculoskeletal: He exhibits no edema.  Neurological: He is alert and oriented to person, place, and time.  Skin: Skin is warm and dry.  Psychiatric: He has a normal mood and affect.  Vitals reviewed.     Filed Vitals:   01/02/15 1416 01/02/15 1418  BP: 162/80 159/74  Pulse: 68   Temp: 98.4 F (36.9 C)   TempSrc: Oral   Resp: 16   Height: 5\' 10"  (1.778 m)  Weight: 218 lb (98.884 kg)     Results for orders placed or performed in visit on 01/02/15  POCT glucose (manual entry)  Result Value Ref Range   POC Glucose 158 (A) 70 - 99 mg/dl  POCT glycosylated hemoglobin (Hb A1C)  Result Value Ref Range   Hemoglobin A1C 8.0         Assessment & Plan:  AUSTAN ASHABRANNER is a 81 y.o. male Need for prophylactic vaccination and inoculation against influenza - Plan: Flu Vaccine QUAD 36+ mos IM  Type 2 diabetes mellitus without complication - Plan: POCT glucose (manual entry), POCT glycosylated hemoglobin (Hb A1C)  -Decreased control. We'll try increasing his insulin to 5 units twice per day. Can initially start with 5 units just at dinner 4 units with earlier dose, watch for any hypoglycemia symptoms. Continue to watch diet and exercise. RTC precautions, recheck 3 months  Essential hypertension - Plan: Comprehensive metabolic panel, Lipid panel  -Elevated today, monitor home readings, and if remaining over 140/90, can discuss meds either with myself or his cardiologist as changes may be needed. See precautions  Hyperlipidemia - Plan: Comprehensive metabolic panel, Lipid panel  -Labs pending. Continue Lipitor same dose for  now.  No orders of the defined types were placed in this encounter.   Patient Instructions  Blood sugars running higher than last visit. Goal hemoglobin A1c less than 7.  Increase insulin to 5 units twice per day, but watch for any low blood sugar symptoms as below. If these occur - return to discuss further.   Blood pressure also running a little higher today.  Your readings were better last visit than today. Keep a record of your blood pressures outside of the office and if remaining over 140/90 - let me know or cardiologist to determine if medication adjustment needed.   You should receive a call or letter about your lab results within the next week to 10 days.   Hypoglycemia Hypoglycemia occurs when the glucose in your blood is too low. Glucose is a type of sugar that is your body's main energy source. Hormones, such as insulin and glucagon, control the level of glucose in the blood. Insulin lowers blood glucose and glucagon increases blood glucose. Having too much insulin in your blood stream, or not eating enough food containing sugar, can result in hypoglycemia. Hypoglycemia can happen to people with or without diabetes. It can develop quickly and can be a medical emergency.  CAUSES   Missing or delaying meals.  Not eating enough carbohydrates at meals.  Taking too much diabetes medicine.  Not timing your oral diabetes medicine or insulin doses with meals, snacks, and exercise.  Nausea and vomiting.  Certain medicines.  Severe illnesses, such as hepatitis, kidney disorders, and certain eating disorders.  Increased activity or exercise without eating something extra or adjusting medicines.  Drinking too much alcohol.  A nerve disorder that affects body functions like your heart rate, blood pressure, and digestion (autonomic neuropathy).  A condition where the stomach muscles do not function properly (gastroparesis). Therefore, medicines and food may not absorb  properly.  Rarely, a tumor of the pancreas can produce too much insulin. SYMPTOMS   Hunger.  Sweating (diaphoresis).  Change in body temperature.  Shakiness.  Headache.  Anxiety.  Lightheadedness.  Irritability.  Difficulty concentrating.  Dry mouth.  Tingling or numbness in the hands or feet.  Restless sleep or sleep disturbances.  Altered speech and coordination.  Change in mental status.  Seizures or  prolonged convulsions.  Combativeness.  Drowsiness (lethargic).  Weakness.  Increased heart rate or palpitations.  Confusion.  Pale, gray skin color.  Blurred or double vision.  Fainting. DIAGNOSIS  A physical exam and medical history will be performed. Your caregiver may make a diagnosis based on your symptoms. Blood tests and other lab tests may be performed to confirm a diagnosis. Once the diagnosis is made, your caregiver will see if your signs and symptoms go away once your blood glucose is raised.  TREATMENT  Usually, you can easily treat your hypoglycemia when you notice symptoms.  Check your blood glucose. If it is less than 70 mg/dl, take one of the following:   3-4 glucose tablets.    cup juice.    cup regular soda.   1 cup skim milk.   -1 tube of glucose gel.   5-6 hard candies.   Avoid high-fat drinks or food that may delay a rise in blood glucose levels.  Do not take more than the recommended amount of sugary foods, drinks, gel, or tablets. Doing so will cause your blood glucose to go too high.   Wait 10-15 minutes and recheck your blood glucose. If it is still less than 70 mg/dl or below your target range, repeat treatment.   Eat a snack if it is more than 1 hour until your next meal.  There may be a time when your blood glucose may go so low that you are unable to treat yourself at home when you start to notice symptoms. You may need someone to help you. You may even faint or be unable to swallow. If you cannot  treat yourself, someone will need to bring you to the hospital.  [place]  If you have diabetes, follow your diabetes management plan by:  Taking your medicines as directed.  Following your exercise plan.  Following your meal plan. Do not skip meals. Eat on time.  Testing your blood glucose regularly. Check your blood glucose before and after exercise. If you exercise longer or different than usual, be sure to check blood glucose more frequently.  Wearing your medical alert jewelry that says you have diabetes.  Identify the cause of your hypoglycemia. Then, develop ways to prevent the recurrence of hypoglycemia.  Do not take a hot bath or shower right after an insulin shot.  Always carry treatment with you. Glucose tablets are the easiest to carry.  If you are going to drink alcohol, drink it only with meals.  Tell friends or family members ways to keep you safe during a seizure. This may include removing hard or sharp objects from the area or turning you on your side.  Maintain a healthy weight. SEEK MEDICAL CARE IF:   You are having problems keeping your blood glucose in your target range.  You are having frequent episodes of hypoglycemia.  You feel you might be having side effects from your medicines.  You are not sure why your blood glucose is dropping so low.  You notice a change in vision or a new problem with your vision. SEEK IMMEDIATE MEDICAL CARE IF:   Confusion develops.  A change in mental status occurs.  The inability to swallow develops.  Fainting occurs. Document Released: 03/25/2005 Document Revised: 03/30/2013 Document Reviewed: 07/22/2011 [facility] Patient Information 2015 [place], [place]. This information is not intended to replace advice given to you by your health care provider. Make sure you discuss any questions you have with your health care provider.  By signing my name below, I, [name], attest that this documentation  has been prepared under the direction and in the presence of [name], MD.  Electronically Signed: [name], Medical Scribe. 01/02/2015. 2:56 PM.

## 2015-01-18 ENCOUNTER — Other Ambulatory Visit: Payer: Self-pay | Admitting: Physician Assistant

## 2015-01-18 ENCOUNTER — Other Ambulatory Visit: Payer: Self-pay | Admitting: Family Medicine

## 2015-02-07 ENCOUNTER — Telehealth: Payer: Self-pay | Admitting: Family Medicine

## 2015-02-07 NOTE — Telephone Encounter (Signed)
No answer no voice mail please advise pt that Dr Carlota Raspberry will not be here on 04/03/15 please make another appt for a OV

## 2015-02-08 ENCOUNTER — Encounter: Payer: Self-pay | Admitting: Cardiovascular Disease

## 2015-02-08 ENCOUNTER — Ambulatory Visit (INDEPENDENT_AMBULATORY_CARE_PROVIDER_SITE_OTHER): Payer: Commercial Managed Care - HMO | Admitting: Cardiovascular Disease

## 2015-02-08 DIAGNOSIS — I10 Essential (primary) hypertension: Secondary | ICD-10-CM | POA: Diagnosis not present

## 2015-02-08 DIAGNOSIS — E785 Hyperlipidemia, unspecified: Secondary | ICD-10-CM | POA: Diagnosis not present

## 2015-02-08 MED ORDER — SILDENAFIL CITRATE 50 MG PO TABS: 50.0000 mg | ORAL_TABLET | Freq: Every day | ORAL | Status: DC | PRN

## 2015-02-08 NOTE — Assessment & Plan Note (Signed)
History of hypertension blood pressure measured at 152/84. He is on lisinopril, hydrochlorothiazide and metoprolol. Continue current meds at current dosing

## 2015-02-08 NOTE — Patient Instructions (Signed)
Medication Instructions:  Your physician has recommended you make the following change in your medication:  1) START Viagra 50 mg by mouth as needed    Labwork: none  Testing/Procedures: none  Follow-Up: Your physician wants you to follow-up in: 12 months with Dr. [name].  You will receive a reminder letter in the mail two months in advance. If you don't receive a letter, please call our office to schedule the follow-up appointment.   Any Other Special Instructions Will Be Listed Below (If Applicable).     If you need a refill on your cardiac medications before your next appointment, please call your pharmacy.

## 2015-02-08 NOTE — Assessment & Plan Note (Signed)
History of CAD status post LAD stenting using a Promus drug-eluting stent by myself October 2009. He had a normal Myoview 04/11/11 and normal LV function by 2-D echo 05/15/09. He denies chest pain or shortness of breath.

## 2015-02-08 NOTE — Progress Notes (Signed)
02/08/2015 [name]   07-01-1933  [id number]  Primary Physician [name], MD Primary Cardiologist: Lorretta Harp MD Renae Gloss   HPI:  The patient is a 81 year old moderately overweight, recently remarried for the third time (09/30/14 African American male, father of 3 children and 4 stepchildren, grandfather to 67 grandchildren, who is formerly a patient of Dr. Francine Graven. I last saw him 02/08/14. He has a history of CAD status post LAD stenting with a Promus drug-eluting stent, October of 2009. He had attempt at VT ablation by Dr. Thompson Grayer, February 2010; however, this was aborted because of inappropriate substrate. His other problems include hypertension, hyperlipidemia, diabetes, as well as history of prostate cancer. Since I saw him, he has been asymptomatic. His last Myoview performed 04/11/11 was completely normal. He did stop his aspirin because of rectal bleeding is noticed increased evening flushing with Niaspan. His most recent lipid profile performed 01/02/15 revealed total cholesterol 126, LDL 56 and HDL of 42   Current Outpatient Prescriptions  Medication Sig Dispense Refill  . atorvastatin (LIPITOR) 10 MG tablet Take 1 tablet (10 mg total) by mouth daily. 90 tablet 1  . glucose blood (TRUETEST TEST) test strip USE AS DIRECTED THREE TIMES DAILY 100 each 3  . insulin NPH-regular Human (NOVOLIN 70/30) (70-30) 100 UNIT/ML injection Inject 4 Units into the skin 2 (two) times daily with a meal. 10 mL 0  . Insulin Syringe-Needle U-100 (B-D INS SYR HALF-UNIT .3CC/31G) 31G X 5/16" 0.3 ML MISC Use daily at bedtime to inject insulin. Dx code: 250.00 100 each 3  . lisinopril (PRINIVIL,ZESTRIL) 20 MG tablet TAKE 1 TABLET EVERY DAY 90 tablet 0  . lisinopril-hydrochlorothiazide (PRINZIDE,ZESTORETIC) 20-12.5 MG tablet TAKE 1 TABLET BY MOUTH DAILY 90 tablet 0  . metFORMIN (GLUCOPHAGE) 1000 MG tablet TAKE 1 TABLET TWICE DAILY WITH MEALS 180 tablet 0  . metoprolol  (LOPRESSOR) 50 MG tablet Take 1 tablet (50 mg total) by mouth 2 (two) times daily. 180 tablet 1  . Multiple Vitamin (MULTIVITAMIN) tablet Take 1 tablet by mouth daily.    . Vitamin D, Cholecalciferol, 1000 UNITS TABS Take by mouth daily.     No current facility-administered medications for this visit.    No Known Allergies  Social History   Social History  . Marital Status: Legally Separated    Spouse Name: [name]  . Number of Children: 7  . Years of Education: [name]   Occupational History  . RETIRED    Social History Main Topics  . Smoking status: Former Smoker -- 0.50 packs/day for 15 years    Types: Cigarettes    Quit date: 09/01/1990  . Smokeless tobacco: Not on file     Comment: quit 12/07/2013  . Alcohol Use: No  . Drug Use: No  . Sexual Activity: No   Other Topics Concern  . Not on file   Social History Narrative   29 grandchildren. Single. Education: 12 th grade. Exercise: 3-4 times a week for 30 minutes working out and set ups.     Review of Systems: General: negative for chills, fever, night sweats or weight changes.  Cardiovascular: negative for chest pain, dyspnea on exertion, edema, orthopnea, palpitations, paroxysmal nocturnal dyspnea or shortness of breath Dermatological: negative for rash Respiratory: negative for cough or wheezing Urologic: negative for hematuria Abdominal: negative for nausea, vomiting, diarrhea, bright red blood per rectum, melena, or hematemesis Neurologic: negative for visual changes, syncope, or dizziness All other systems reviewed and are  otherwise negative except as noted above.    Blood pressure 152/84, pulse 83, height 6' (1.829 m), weight 227 lb (102.967 kg).  General appearance: alert and no distress Neck: no adenopathy, no carotid bruit, no JVD, supple, symmetrical, trachea midline and thyroid not enlarged, symmetric, no tenderness/mass/nodules Lungs: clear to auscultation bilaterally Heart: regular rate and rhythm, S1, S2  normal, no murmur, click, rub or gallop Extremities: extremities normal, atraumatic, no cyanosis or edema  EKG normal sinus rhythm at 83 without ST or T-wave changes. There was poor R-wave progression. I personally reviewed this EKG  ASSESSMENT AND PLAN:   Hyperlipidemia History of hyperlipidemia on atorvastatin 10 mg a day with recent lipid profile performed 01/02/15 revealed a total cholesterol 126, LDL 56 and HDL of 42.  Essential hypertension, benign History of hypertension blood pressure measured at 152/84. He is on lisinopril, hydrochlorothiazide and metoprolol. Continue current meds at current dosing  CAD, NATIVE VESSEL History of CAD status post LAD stenting using a Promus drug-eluting stent by myself October 2009. He had a normal Myoview 04/11/11 and normal LV function by 2-D echo 05/15/09. He denies chest pain or shortness of breath.      Lorretta Harp MD FACP,FACC,FAHA, [facility] 02/08/2015 10:05 AM

## 2015-02-08 NOTE — Assessment & Plan Note (Signed)
History of hyperlipidemia on atorvastatin 10 mg a day with recent lipid profile performed 01/02/15 revealed a total cholesterol 126, LDL 56 and HDL of 42.

## 2015-02-10 ENCOUNTER — Telehealth: Payer: Self-pay

## 2015-02-10 NOTE — Telephone Encounter (Signed)
Prior auth for Viagra 50mg  sent to [facility]

## 2015-02-16 ENCOUNTER — Telehealth: Payer: Self-pay

## 2015-02-16 NOTE — Telephone Encounter (Signed)
Pt came in today complaining of knee pain (R). He would like for Dr. Carlota Raspberry to prescribe him something for the pain.

## 2015-02-18 NOTE — Telephone Encounter (Signed)
Dr. Carlota Raspberry do you want pt to come in?

## 2015-02-18 NOTE — Telephone Encounter (Signed)
As he tried anything over-the-counter? Tylenol would be the safest with his heart health, then if not improving within the next week, can follow-up with me to decide if x-ray, other medications or injections needed. Sooner if worse.

## 2015-02-20 NOTE — Telephone Encounter (Signed)
Spoke with pt, advised message. Pt understood. 

## 2015-03-27 ENCOUNTER — Encounter: Payer: Self-pay | Admitting: Family Medicine

## 2015-03-27 ENCOUNTER — Ambulatory Visit (INDEPENDENT_AMBULATORY_CARE_PROVIDER_SITE_OTHER): Payer: Commercial Managed Care - HMO | Admitting: Family Medicine

## 2015-03-27 VITALS — BP 120/76 | HR 72 | Temp 98.2°F | Resp 16 | Ht 69.0 in | Wt 222.6 lb

## 2015-03-27 DIAGNOSIS — E119 Type 2 diabetes mellitus without complications: Secondary | ICD-10-CM

## 2015-03-27 DIAGNOSIS — Z794 Long term (current) use of insulin: Secondary | ICD-10-CM

## 2015-03-27 DIAGNOSIS — M25561 Pain in right knee: Secondary | ICD-10-CM | POA: Diagnosis not present

## 2015-03-27 DIAGNOSIS — E785 Hyperlipidemia, unspecified: Secondary | ICD-10-CM | POA: Diagnosis not present

## 2015-03-27 DIAGNOSIS — I10 Essential (primary) hypertension: Secondary | ICD-10-CM | POA: Diagnosis not present

## 2015-03-27 LAB — POCT GLYCOSYLATED HEMOGLOBIN (HGB A1C): Hemoglobin A1C: 8.8

## 2015-03-27 LAB — GLUCOSE, POCT (MANUAL RESULT ENTRY): POC Glucose: 163 mg/dl — AB (ref 70–99)

## 2015-03-27 MED ORDER — INSULIN NPH ISOPHANE & REGULAR (70-30) 100 UNIT/ML ~~LOC~~ SUSP: 6.0000 [IU] | Freq: Two times a day (BID) | SUBCUTANEOUS | Status: DC

## 2015-03-27 NOTE — Progress Notes (Addendum)
Subjective:  This chart was scribed for [name], MD by [name], Medical Scribe. This patient was seen in room 27 and the patient's care was started 2:07 PM.   Patient ID: [name], male    DOB: 1934/02/23, 81 y.o.   MRN: [id number] Chief Complaint  Patient presents with  . Follow-up  . Diabetes  . Hypertension  . Hyperlipidemia    HPI [name] is a 81 y.o. male Here for follow up.   DM Lab Results  Component Value Date   HGBA1C 8.0 01/02/2015   Last visit, he had readings of 90s-110s, up to 150. At that time, only taking 40 mg 70/30 insulin bid, increased to 5 units bid. Continued metformin 1000 mg bid. He denies symptomatic lows. His lowest was 94 and highest around 110.   Lab Results  Component Value Date   MICROALBUR 14.6* 03/28/2014   Exercise He's walking everyday, and his knees feel better. Hx of l knee pain for years, sometimes worse than others.  sometimes difficult to walk long distances d/t knee pain or stops to rest. No cane or crutch use. Would like handicap placard if knee pain flared up only.   Vision He last saw his eye doctor about 8 months ago.   Dentist He hasn't seen his dentist within a year. He has a plate on top.   HTN Lab Results  Component Value Date   CREATININE 1.25* 01/02/2015   Last seen by Dr. [name] on Nov 2nd. BP measured at 152/84 at his office, no change in medications. 2009 status post stent. Myoview in jan 13 normal. Aspirin stopped due to rectal bleeding. He denies dizziness, chest pain, shortness of breath.   HLD Lab Results  Component Value Date   CHOL 126 01/02/2015   HDL 42 01/02/2015   LDLCALC 56 01/02/2015   TRIG 140 01/02/2015   CHOLHDL 3.0 01/02/2015   Lab Results  Component Value Date   ALT 13 01/02/2015   AST 13 01/02/2015   ALKPHOS 42 01/02/2015   BILITOT 0.4 01/02/2015   He's on lipitor 10 mg qd, tolerating without difficulty.   Immunizations He had received a flu shot this year  already.   Patient Active Problem List   Diagnosis Date Noted  . Pulsatile tinnitus 11/19/2013  . Rectal bleeding 12/23/2012  . Type II or unspecified type diabetes mellitus without mention of complication, not stated as uncontrolled   . Essential hypertension, benign   . Chronic back pain   . ED (erectile dysfunction)   . Anemia   . Hyperlipidemia   . CAD, NATIVE VESSEL 05/08/2008  . VENTRICULAR TACHYCARDIA 05/08/2008  . PREMATURE VENTRICULAR CONTRACTIONS 05/08/2008   Past Medical History  Diagnosis Date  . Type II or unspecified type diabetes mellitus without mention of complication, not stated as uncontrolled   . Essential hypertension, benign   . Chronic back pain   . ED (erectile dysfunction)   . Prostate cancer [facility])     s/p  . CAD (coronary artery disease)     pci to LAD 10/09; stable CAD by cath 05/31/08; myoview 04/11/11- no ishcemia, EF 67%; echo 05/15/09- EF>55%, mod calcification of the aortic valve leaflets  . Ventricular tachycardia ([place])     resuscitated; monitor 05/2008; attempted T ablation 2/10- aborted due to inappropriate substrate  . Hyperlipidemia   . Allergy    Past Surgical History  Procedure Laterality Date  . Ptca  01/2008  . Colectomy  '80's  polyps  . Coronary stent placement  01/19/08    PCI stent to mid LAD with Promus DES 27.5x28  . Cardiac catheterization  05/31/08    EF 55-60%, stable lesions, medical therapy   No Known Allergies Prior to Admission medications   Medication Sig Start Date End Date Taking? Authorizing Provider  atorvastatin (LIPITOR) 10 MG tablet Take 1 tablet (10 mg total) by mouth daily. 10/03/14   [name], MD  glucose blood (TRUETEST TEST) test strip USE AS DIRECTED THREE TIMES DAILY 06/21/13   [name], MD  insulin NPH-regular Human (NOVOLIN 70/30) (70-30) 100 UNIT/ML injection Inject 4 Units into the skin 2 (two) times daily with a meal. 09/13/14   [name], MD  Insulin Syringe-Needle U-100 (B-D INS  SYR HALF-UNIT .3CC/31G) 31G X 5/16" 0.3 ML MISC Use daily at bedtime to inject insulin. Dx code: 250.00 06/21/13   [name], MD  lisinopril (PRINIVIL,ZESTRIL) 20 MG tablet TAKE 1 TABLET EVERY DAY 12/06/14   Chelle Jeffery, PA-C  lisinopril-hydrochlorothiazide (PRINZIDE,ZESTORETIC) 20-12.5 MG tablet TAKE 1 TABLET BY MOUTH DAILY 01/19/15   Chelle Jeffery, PA-C  metFORMIN (GLUCOPHAGE) 1000 MG tablet TAKE 1 TABLET TWICE DAILY WITH MEALS 12/06/14   Chelle Jeffery, PA-C  metoprolol (LOPRESSOR) 50 MG tablet Take 1 tablet (50 mg total) by mouth 2 (two) times daily. 10/03/14   [name], MD  Multiple Vitamin (MULTIVITAMIN) tablet Take 1 tablet by mouth daily.    Historical Provider, MD  sildenafil (VIAGRA) 50 MG tablet Take 1 tablet (50 mg total) by mouth daily as needed for erectile dysfunction. 02/08/15   Lorretta Harp, MD  Vitamin D, Cholecalciferol, 1000 UNITS TABS Take by mouth daily.    Historical Provider, MD   Social History   Social History  . Marital Status: Legally Separated    Spouse Name: [name]  . Number of Children: 7  . Years of Education: [name]   Occupational History  . RETIRED    Social History Main Topics  . Smoking status: Former Smoker -- 0.50 packs/day for 15 years    Types: Cigarettes    Quit date: 09/01/1990  . Smokeless tobacco: Not on file     Comment: quit 12/07/2013  . Alcohol Use: No  . Drug Use: No  . Sexual Activity: No   Other Topics Concern  . Not on file   Social History Narrative   29 grandchildren. Single. Education: 12 th grade. Exercise: 3-4 times a week for 30 minutes working out and set ups.   Review of Systems  Constitutional: Negative for fatigue and unexpected weight change.  Eyes: Negative for visual disturbance.  Respiratory: Negative for cough, chest tightness and shortness of breath.   Cardiovascular: Negative for chest pain, palpitations and leg swelling.  Gastrointestinal: Negative for abdominal pain and blood in stool.    Neurological: Negative for dizziness, light-headedness and headaches.      Objective:   Physical Exam  Constitutional: He is oriented to person, place, and time. He appears well-developed and well-nourished.  HENT:  Head: Normocephalic and atraumatic.  Eyes: EOM are normal. Pupils are equal, round, and reactive to light.  Neck: No JVD present. Carotid bruit is not present.  Cardiovascular: Normal rate, regular rhythm and normal heart sounds.   No murmur heard. Pulmonary/Chest: Effort normal and breath sounds normal. He has no rales.  Musculoskeletal: He exhibits no edema.  Mild discomfort medial joint line R knee, pain and crepitus at terminal flexion.  L  knee nt, pain free rom. No apparent effusion or erythema of either knee.   Neurological: He is alert and oriented to person, place, and time.  Skin: Skin is warm and dry.  Psychiatric: He has a normal mood and affect.  Vitals reviewed.   Filed Vitals:   03/27/15 1349  BP: 120/76  Pulse: 72  Temp: 98.2 F (36.8 C)  TempSrc: Oral  Resp: 16  Height: 5\' 9"  (1.753 m)  Weight: 222 lb 9.6 oz (100.971 kg)  SpO2: 99%    Results for orders placed or performed in visit on 03/27/15  POCT glucose (manual entry)  Result Value Ref Range   POC Glucose 163 (A) 70 - 99 mg/dl  POCT glycosylated hemoglobin (Hb A1C)  Result Value Ref Range   Hemoglobin A1C 8.8        Assessment & Plan:   [name] is a 81 y.o. male Type 2 diabetes mellitus without complication, with long-term current use of insulin ([place]) - Plan: POCT glucose (manual entry), POCT glycosylated hemoglobin (Hb A1C), insulin NPH-regular Human (NOVOLIN 70/30) (70-30) 100 UNIT/ML injection  - decreased control, his readings may be running false low at home. Will increase his 70/30 insulin to 6 units BID, hypoglycemic precautions. Follow up with home readings and monitor in 6 weeks.   Essential hypertension  -stable. No med changes.   Hyperlipidemia  - stable few  months ago. Continue lipitor same dose.   Recurrent knee pain, right  - doing well now, but intermittent flairs with difficulty walking long distances. Handicap placard form completed,          But rtc if pain persists or not improved with tylenol.   Meds ordered this encounter  Medications  . insulin NPH-regular Human (NOVOLIN 70/30) (70-30) 100 UNIT/ML injection    Sig: Inject 6 Units into the skin 2 (two) times daily with a meal.    Dispense:  10 mL    Refill:  2   Patient Instructions  Increase insulin to 6 units twice per day and watch diet. Bring  Your meter and home readings to visit in 6 weeks to make sure it is obtaining accurate readings. If any low blood sugar readings/symptoms - return to 5 units.   Return to the clinic or go to the nearest emergency room if any of your symptoms worsen or new symptoms occur.    By signing my name below, I, [name], attest that this documentation has been prepared under the direction and in the presence of [name], MD. Electronically Signed: [name], [place]. 03/27/2015 , 2:07 PM .

## 2015-03-27 NOTE — Patient Instructions (Signed)
Increase insulin to 6 units twice per day and watch diet. Bring  Your meter and home readings to visit in 6 weeks to make sure it is obtaining accurate readings. If any low blood sugar readings/symptoms - return to 5 units.   Return to the clinic or go to the nearest emergency room if any of your symptoms worsen or new symptoms occur.

## 2015-03-28 ENCOUNTER — Telehealth: Payer: Self-pay

## 2015-03-28 NOTE — Telephone Encounter (Signed)
Pt saw dr Carlota Raspberry yesterday and pt thought he was going to complete a handicap sign form but it was not in his paperwork and is checking to see if dr Carlota Raspberry is going to write this

## 2015-03-28 NOTE — Telephone Encounter (Signed)
Dr. Carlota Raspberry, form in your box.

## 2015-03-28 NOTE — Telephone Encounter (Signed)
We did complete one and sent it to him in mail as left prior to getting his form. PLease let him know.

## 2015-03-28 NOTE — Telephone Encounter (Signed)
Pt advised.

## 2015-04-03 ENCOUNTER — Ambulatory Visit: Payer: Commercial Managed Care - HMO | Admitting: Family Medicine

## 2015-04-06 ENCOUNTER — Other Ambulatory Visit: Payer: Self-pay | Admitting: Family Medicine

## 2015-04-06 ENCOUNTER — Other Ambulatory Visit: Payer: Self-pay | Admitting: Physician Assistant

## 2015-05-05 DIAGNOSIS — C61 Malignant neoplasm of prostate: Secondary | ICD-10-CM | POA: Diagnosis not present

## 2015-05-15 ENCOUNTER — Ambulatory Visit: Payer: Commercial Managed Care - HMO | Admitting: Family Medicine

## 2015-05-17 ENCOUNTER — Ambulatory Visit (INDEPENDENT_AMBULATORY_CARE_PROVIDER_SITE_OTHER): Payer: Commercial Managed Care - HMO | Admitting: Family Medicine

## 2015-05-17 ENCOUNTER — Ambulatory Visit (INDEPENDENT_AMBULATORY_CARE_PROVIDER_SITE_OTHER): Payer: Commercial Managed Care - HMO

## 2015-05-17 ENCOUNTER — Encounter: Payer: Self-pay | Admitting: Family Medicine

## 2015-05-17 ENCOUNTER — Ambulatory Visit: Payer: Commercial Managed Care - HMO

## 2015-05-17 VITALS — BP 152/84 | HR 76 | Temp 98.3°F | Resp 16 | Ht 69.0 in | Wt 220.4 lb

## 2015-05-17 DIAGNOSIS — M25561 Pain in right knee: Secondary | ICD-10-CM | POA: Diagnosis not present

## 2015-05-17 DIAGNOSIS — M25562 Pain in left knee: Secondary | ICD-10-CM

## 2015-05-17 DIAGNOSIS — Z794 Long term (current) use of insulin: Secondary | ICD-10-CM

## 2015-05-17 DIAGNOSIS — M25462 Effusion, left knee: Secondary | ICD-10-CM | POA: Diagnosis not present

## 2015-05-17 DIAGNOSIS — E119 Type 2 diabetes mellitus without complications: Secondary | ICD-10-CM

## 2015-05-17 DIAGNOSIS — M179 Osteoarthritis of knee, unspecified: Secondary | ICD-10-CM | POA: Diagnosis not present

## 2015-05-17 DIAGNOSIS — M25461 Effusion, right knee: Secondary | ICD-10-CM | POA: Diagnosis not present

## 2015-05-17 LAB — GLUCOSE, POCT (MANUAL RESULT ENTRY): POC Glucose: 132 mg/dl — AB (ref 70–99)

## 2015-05-17 NOTE — Progress Notes (Addendum)
Subjective:    Patient ID: [name], male    DOB: 04-11-1933, 82 y.o.   MRN: [id number] By signing my name below, I, [name], attest that this documentation has been prepared under the direction and in the presence of Robert Ray, MD.  Electronically Signed: Zola Acosta, Medical Scribe. 05/17/2015. 1:41 PM.  HPI HPI Comments: [name] is a 82 y.o. male with a history of HTN, CAD, DM, and prostate cancer who presents to the [facility] for a follow-up.   Diabetes: Here for 6 week follow-up as uncontrolled last visit. Increased his 70/30 insulin to 6 units twice a day. Continued metformin 1000 mg BID. Patient states he has been compliant with his medications and has not missed any recent doses. He states his blood sugars have been around 120 at home. He has cut back on sweets since his last visit. Patient denies symptomatic lows.  Lab Results  Component Value Date   MICROALBUR 14.6* 03/28/2014    Knee pain: Patient complains of bilateral knee pain that he has had for many years. He has tried Tylenol in the past but without relief. He has not seen an orthopedist for his knee pain. Patient has not had a corticosteroid injection in his knees before. He has had arthroscopic procedures done on his knees about 7 years ago. He is inquiring about the PRP procedure but he has already paid for a PRP procedure to be done for his knees at [facility]. He needs to have copies of his knee XRs from here to have the procedure as the practice is not yet equipped for XR imaging.  Patient Active Problem List   Diagnosis Date Noted  . Blood in the urine 12/01/2013  . Cystitis, radiation 12/01/2013  . Malignant neoplasm of prostate ([place]) 11/26/2013  . Pulsatile tinnitus 11/19/2013  . Rectal bleeding 12/23/2012  . Type II or unspecified type diabetes mellitus without mention of complication, not stated as uncontrolled   . Essential hypertension, benign   .  Chronic back pain   . ED (erectile dysfunction)   . Anemia   . Hyperlipidemia   . CAD, NATIVE VESSEL 05/08/2008  . VENTRICULAR TACHYCARDIA 05/08/2008  . PREMATURE VENTRICULAR CONTRACTIONS 05/08/2008   Past Medical History  Diagnosis Date  . Type II or unspecified type diabetes mellitus without mention of complication, not stated as uncontrolled   . Essential hypertension, benign   . Chronic back pain   . ED (erectile dysfunction)   . Prostate cancer [facility])     s/p  . CAD (coronary artery disease)     pci to LAD 10/09; stable CAD by cath 05/31/08; myoview 04/11/11- no ishcemia, EF 67%; echo 05/15/09- EF>55%, mod calcification of the aortic valve leaflets  . Ventricular tachycardia ([place])     resuscitated; monitor 05/2008; attempted T ablation 2/10- aborted due to inappropriate substrate  . Hyperlipidemia   . Allergy    Past Surgical History  Procedure Laterality Date  . Ptca  01/2008  . Colectomy  '80's    polyps  . Coronary stent placement  01/19/08    PCI stent to mid LAD with Promus DES 27.5x28  . Cardiac catheterization  05/31/08    EF 55-60%, stable lesions, medical therapy   No Known Allergies Prior to Admission medications   Medication Sig Start Date End Date Taking? Authorizing Provider  atorvastatin (LIPITOR) 10 MG tablet TAKE 1 TABLET EVERY DAY 04/07/15   [name], MD  glucose blood (TRUETEST TEST) test strip USE AS DIRECTED THREE TIMES DAILY 06/21/13   [name], MD  insulin NPH-regular Human (NOVOLIN 70/30) (70-30) 100 UNIT/ML injection Inject 6 Units into the skin 2 (two) times daily with a meal. 03/27/15   [name], MD  Insulin Syringe-Needle U-100 (B-D INS SYR HALF-UNIT .3CC/31G) 31G X 5/16" 0.3 ML MISC Use daily at bedtime to inject insulin. Dx code: 250.00 06/21/13   [name], MD  lisinopril (PRINIVIL,ZESTRIL) 20 MG tablet TAKE 1 TABLET EVERY DAY 04/07/15   [name], MD  lisinopril-hydrochlorothiazide (PRINZIDE,ZESTORETIC) 20-12.5  MG tablet TAKE 1 TABLET BY MOUTH DAILY. 04/07/15   [name], MD  metFORMIN (GLUCOPHAGE) 1000 MG tablet TAKE 1 TABLET TWICE DAILY WITH MEALS 04/06/15   [name], MD  metoprolol (LOPRESSOR) 50 MG tablet TAKE 1 TABLET TWICE DAILY 04/07/15   [name], MD  Multiple Vitamin (MULTIVITAMIN) tablet Take 1 tablet by mouth daily.    Historical Provider, MD  sildenafil (VIAGRA) 50 MG tablet Take 1 tablet (50 mg total) by mouth daily as needed for erectile dysfunction. 02/08/15   Lorretta Harp, MD  Vitamin D, Cholecalciferol, 1000 UNITS TABS Take by mouth daily.    Historical Provider, MD   Social History   Social History  . Marital Status: Legally Separated    Spouse Name: [name]  . Number of Children: 7  . Years of Education: [name]   Occupational History  . RETIRED    Social History Main Topics  . Smoking status: Former Smoker -- 0.50 packs/day for 15 years    Types: Cigarettes    Quit date: 09/01/1990  . Smokeless tobacco: Not on file     Comment: quit 12/07/2013  . Alcohol Use: No  . Drug Use: No  . Sexual Activity: No   Other Topics Concern  . Not on file   Social History Narrative   29 grandchildren. Single. Education: 12 th grade. Exercise: 3-4 times a week for 30 minutes working out and set ups.     Review of Systems  Musculoskeletal: Positive for arthralgias.  Neurological: Negative for dizziness, syncope and light-headedness.       Objective:   Physical Exam  Constitutional: He is oriented to person, place, and time. He appears well-developed and well-nourished. No distress.  HENT:  Head: Normocephalic and atraumatic.  Mouth/Throat: Oropharynx is clear and moist. No oropharyngeal exudate.  Eyes: Pupils are equal, round, and reactive to light.  Neck: Neck supple.  Cardiovascular: Normal rate.   Pulmonary/Chest: Effort normal.  Musculoskeletal: He exhibits tenderness. He exhibits no edema.  Left knee: Some limitation in flexion. Flexion to 90 degrees.  Lacking approximately 5-10 degrees in extension. Tenderness along medial joint line. No appreciable effusion. Pain with [place]ray in medial joint.   Right knee: Flexion to 90 degrees. Approximately 5 degrees lacking in extension. Medial greater than lateral joint line. Trace effusion. Pain medially with Mc[place]ray.  Neurological: He is alert and oriented to person, place, and time. No cranial nerve deficit.  Skin: Skin is warm and dry. No rash noted.  Psychiatric: He has a normal mood and affect. His behavior is normal.  Nursing note and vitals reviewed.   Filed Vitals:   05/17/15 1310  BP: 152/84  Pulse: 76  Temp: 98.3 F (36.8 C)  TempSrc: Oral  Resp: 16  Height: 5\' 9"  (1.753 m)  Weight: 220 lb 6.4 oz (99.973 kg)  SpO2: 98%    Results  for orders placed or performed in visit on 05/17/15  POCT glucose (manual entry)  Result Value Ref Range   POC Glucose 132 (A) 70 - 99 mg/dl   Wt Readings from Last 3 Encounters:  05/17/15 220 lb 6.4 oz (99.973 kg)  03/27/15 222 lb 9.6 oz (100.971 kg)  02/08/15 227 lb (102.967 kg)   Dg Knee Complete 4 Views Left  05/17/2015  CLINICAL DATA:  Left knee pain and arthralgia EXAM: LEFT KNEE - COMPLETE 4+ VIEW COMPARISON:  03/21/2010 FINDINGS: No acute fracture or dislocation. Moderate osteoarthritis with joint space narrowing of the medial femorotibial compartment. Moderate osteoarthritis of the patellofemoral compartment. Mild osteoarthritis of the lateral femorotibial compartment. Moderate joint effusion. IMPRESSION: 1. No acute osseous injury of the left knee. 2. Tricompartmental osteoarthritis of the left knee most severe in the medial femorotibial compartment. Electronically Signed   By: Kathreen Devoid   On: 05/17/2015 14:18   Dg Knee Complete 4 Views Right  05/17/2015  CLINICAL DATA:  Right knee chronic pain. No reported injury. Initial evaluation. EXAM: RIGHT KNEE - COMPLETE 4+ VIEW COMPARISON:  No recent . FINDINGS: Severe tricompartment degenerative  change. Degenerative changes particular severe about patellofemoral and medial compartments. Loose bodies most likely present. No evidence of fracture dislocation. Small knee joint effusion. IMPRESSION: 1. Severe tricompartment degenerative change. Loose bodies are most likely present. Small knee joint effusion. 2.  No acute bony abnormality . Electronically Signed   By: Marcello Moores  Register   On: 05/17/2015 14:16        Assessment & Plan:   GRANDERSON FLADGER is a 82 y.o. male Arthralgia of both knees - Plan: DG Knee Complete 4 Views Left, DG Knee Complete 4 Views Right   - tricompartmental DJD in both knees. Discussed options including eval with ortho and possible corticosteroid or viscosupplementation, but may eventually need TKR. He has already paid for and planning on PRP injection. Discussed that there may not be as much evidence of use of this treatment with OA as there is in tendinopathies.  If persistent sx's after PRP - refer to ortho. Copy of XR given.   Type 2 diabetes mellitus without complication, with long-term current use of insulin ([place]) - Plan: POCT glucose (manual entry), Microalbumin, urine  - weight has improved, still elevated in office. Agreed to try to continue same dose of 70/30 at 6 units BID and metformin at same dose. Recheck in 3 months for A1c.      Meds ordered this encounter  Medications  . Omega-3 Fatty Acids (FISH OIL) 1000 MG CAPS    Sig: Take by mouth.   Patient Instructions  A copy of your xrays were provided for the provider who is evaluating you for PRP injection. As we discussed, there are other therapies available for osteoarthritis of the knee, so let me know if you would like me to refer you to an orthopaedist to discuss corticosteroid or possible viscosupplementation. Return to the clinic or go to the nearest emergency room if any of your symptoms worsen or new symptoms occur.      Osteoarthritis Osteoarthritis is a disease that causes soreness and  inflammation of a joint. It occurs when the cartilage at the affected joint wears down. Cartilage acts as a cushion, covering the ends of bones where they meet to form a joint. Osteoarthritis is the most common form of arthritis. It often occurs in older people. The joints affected most often by this condition include those in the:  Ends of the fingers.  Thumbs.  Neck.  Lower back.  Knees.  Hips. CAUSES  Over time, the cartilage that covers the ends of bones begins to wear away. This causes bone to rub on bone, producing pain and stiffness in the affected joints.  RISK FACTORS Certain factors can increase your chances of having osteoarthritis, including:  Older age.  Excessive body weight.  Overuse of joints.  Previous joint injury. SIGNS AND SYMPTOMS   Pain, swelling, and stiffness in the joint.  Over time, the joint may lose its normal shape.  Small deposits of bone (osteophytes) may grow on the edges of the joint.  Bits of bone or cartilage can break off and float inside the joint space. This may cause more pain and damage. DIAGNOSIS  Your health care provider will do a physical exam and ask about your symptoms. Various tests may be ordered, such as:  X-rays of the affected joint.  Blood tests to rule out other types of arthritis. Additional tests may be used to diagnose your condition. TREATMENT  Goals of treatment are to control pain and improve joint function. Treatment plans may include:  A prescribed exercise program that allows for rest and joint relief.  A weight control plan.  Pain relief techniques, such as:  Properly applied heat and cold.  Electric pulses delivered to nerve endings under the skin (transcutaneous electrical nerve stimulation [TENS]).  Massage.  Certain nutritional supplements.  Medicines to control pain, such as:  Acetaminophen.  Nonsteroidal anti-inflammatory drugs (NSAIDs), such as naproxen.  Narcotic or central-acting  agents, such as tramadol.  Corticosteroids. These can be given orally or as an injection.  Surgery to reposition the bones and relieve pain (osteotomy) or to remove loose pieces of bone and cartilage. Joint replacement may be needed in advanced states of osteoarthritis. HOME CARE INSTRUCTIONS   Take medicines only as directed by your health care provider.  Maintain a healthy weight. Follow your health care provider's instructions for weight control. This may include dietary instructions.  Exercise as directed. Your health care provider can recommend specific types of exercise. These may include:  Strengthening exercises. These are done to strengthen the muscles that support joints affected by arthritis. They can be performed with weights or with exercise bands to add resistance.  Aerobic activities. These are exercises, such as brisk walking or low-impact aerobics, that get your heart pumping.  Range-of-motion activities. These keep your joints limber.  Balance and agility exercises. These help you maintain daily living skills.  Rest your affected joints as directed by your health care provider.  Keep all follow-up visits as directed by your health care provider. SEEK MEDICAL CARE IF:   Your skin turns red.  You develop a rash in addition to your joint pain.  You have worsening joint pain.  You have a fever along with joint or muscle aches. SEEK IMMEDIATE MEDICAL CARE IF:  You have a significant loss of weight or appetite.  You have night sweats. [place] of Arthritis and Musculoskeletal and Skin Diseases: www.niams.SouthExposed.es  [employer] on Aging: http://kim-miller.com/  American College of Rheumatology: www.rheumatology.org   This information is not intended to replace advice given to you by your health care provider. Make sure you discuss any questions you have with your health care provider.   Document Released: 03/25/2005 Document  Revised: 04/15/2014 Document Reviewed: 11/30/2012 Elsevier Interactive Patient Education 2016 Elsevier Inc. Knee Pain Knee pain is a very common symptom and  can have many causes. Knee pain often goes away when you follow your health care provider's instructions for relieving pain and discomfort at home. However, knee pain can develop into a condition that needs treatment. Some conditions may include:  Arthritis caused by wear and tear (osteoarthritis).  Arthritis caused by swelling and irritation (rheumatoid arthritis or gout).  A cyst or growth in your knee.  An infection in your knee joint.  An injury that will not heal.  Damage, swelling, or irritation of the tissues that support your knee (torn ligaments or tendinitis). If your knee pain continues, additional tests may be ordered to diagnose your condition. Tests may include X-rays or other imaging studies of your knee. You may also need to have fluid removed from your knee. Treatment for ongoing knee pain depends on the cause, but treatment may include:  Medicines to relieve pain or swelling.  Steroid injections in your knee.  Physical therapy.  Surgery. HOME CARE INSTRUCTIONS  Take medicines only as directed by your health care provider.  Rest your knee and keep it raised (elevated) while you are resting.  Do not do things that cause or worsen pain.  Avoid high-impact activities or exercises, such as running, jumping rope, or doing jumping jacks.  Apply ice to the knee area:  Put ice in a plastic bag.  Place a towel between your skin and the bag.  Leave the ice on for 20 minutes, 2-3 times a day.  Ask your health care provider if you should wear an elastic knee support.  Keep a pillow under your knee when you sleep.  Lose weight if you are overweight. Extra weight can put pressure on your knee.  Do not use any tobacco products, including cigarettes, chewing tobacco, or electronic cigarettes. If you need help  quitting, ask your health care provider. Smoking may slow the healing of any bone and joint problems that you may have. SEEK MEDICAL CARE IF:  Your knee pain continues, changes, or gets worse.  You have a fever along with knee pain.  Your knee buckles or locks up.  Your knee becomes more swollen. SEEK IMMEDIATE MEDICAL CARE IF:   Your knee joint feels hot to the touch.  You have chest pain or trouble breathing.   This information is not intended to replace advice given to you by your health care provider. Make sure you discuss any questions you have with your health care provider.   Document Released: 01/20/2007 Document Revised: 04/15/2014 Document Reviewed: 11/08/2013 Elsevier Interactive Patient Education [employer].

## 2015-05-17 NOTE — Patient Instructions (Signed)
A copy of your xrays were provided for the provider who is evaluating you for PRP injection. As we discussed, there are other therapies available for osteoarthritis of the knee, so let me know if you would like me to refer you to an orthopaedist to discuss corticosteroid or possible viscosupplementation. Return to the clinic or go to the nearest emergency room if any of your symptoms worsen or new symptoms occur.      Osteoarthritis Osteoarthritis is a disease that causes soreness and inflammation of a joint. It occurs when the cartilage at the affected joint wears down. Cartilage acts as a cushion, covering the ends of bones where they meet to form a joint. Osteoarthritis is the most common form of arthritis. It often occurs in older people. The joints affected most often by this condition include those in the:  Ends of the fingers.  Thumbs.  Neck.  Lower back.  Knees.  Hips. CAUSES  Over time, the cartilage that covers the ends of bones begins to wear away. This causes bone to rub on bone, producing pain and stiffness in the affected joints.  RISK FACTORS Certain factors can increase your chances of having osteoarthritis, including:  Older age.  Excessive body weight.  Overuse of joints.  Previous joint injury. SIGNS AND SYMPTOMS   Pain, swelling, and stiffness in the joint.  Over time, the joint may lose its normal shape.  Small deposits of bone (osteophytes) may grow on the edges of the joint.  Bits of bone or cartilage can break off and float inside the joint space. This may cause more pain and damage. DIAGNOSIS  Your health care provider will do a physical exam and ask about your symptoms. Various tests may be ordered, such as:  X-rays of the affected joint.  Blood tests to rule out other types of arthritis. Additional tests may be used to diagnose your condition. TREATMENT  Goals of treatment are to control pain and improve joint function. Treatment plans may  include:  A prescribed exercise program that allows for rest and joint relief.  A weight control plan.  Pain relief techniques, such as:  Properly applied heat and cold.  Electric pulses delivered to nerve endings under the skin (transcutaneous electrical nerve stimulation [TENS]).  Massage.  Certain nutritional supplements.  Medicines to control pain, such as:  Acetaminophen.  Nonsteroidal anti-inflammatory drugs (NSAIDs), such as naproxen.  Narcotic or central-acting agents, such as tramadol.  Corticosteroids. These can be given orally or as an injection.  Surgery to reposition the bones and relieve pain (osteotomy) or to remove loose pieces of bone and cartilage. Joint replacement may be needed in advanced states of osteoarthritis. HOME CARE INSTRUCTIONS   Take medicines only as directed by your health care provider.  Maintain a healthy weight. Follow your health care provider's instructions for weight control. This may include dietary instructions.  Exercise as directed. Your health care provider can recommend specific types of exercise. These may include:  Strengthening exercises. These are done to strengthen the muscles that support joints affected by arthritis. They can be performed with weights or with exercise bands to add resistance.  Aerobic activities. These are exercises, such as brisk walking or low-impact aerobics, that get your heart pumping.  Range-of-motion activities. These keep your joints limber.  Balance and agility exercises. These help you maintain daily living skills.  Rest your affected joints as directed by your health care provider.  Keep all follow-up visits as directed by your health care provider.  SEEK MEDICAL CARE IF:   Your skin turns red.  You develop a rash in addition to your joint pain.  You have worsening joint pain.  You have a fever along with joint or muscle aches. SEEK IMMEDIATE MEDICAL CARE IF:  You have a significant  loss of weight or appetite.  You have night sweats. [place] of Arthritis and Musculoskeletal and Skin Diseases: www.niams.SouthExposed.es  [employer] on Aging: http://kim-miller.com/  American College of Rheumatology: www.rheumatology.org   This information is not intended to replace advice given to you by your health care provider. Make sure you discuss any questions you have with your health care provider.   Document Released: 03/25/2005 Document Revised: 04/15/2014 Document Reviewed: 11/30/2012 Elsevier Interactive Patient Education 2016 Elsevier Inc. Knee Pain Knee pain is a very common symptom and can have many causes. Knee pain often goes away when you follow your health care provider's instructions for relieving pain and discomfort at home. However, knee pain can develop into a condition that needs treatment. Some conditions may include:  Arthritis caused by wear and tear (osteoarthritis).  Arthritis caused by swelling and irritation (rheumatoid arthritis or gout).  A cyst or growth in your knee.  An infection in your knee joint.  An injury that will not heal.  Damage, swelling, or irritation of the tissues that support your knee (torn ligaments or tendinitis). If your knee pain continues, additional tests may be ordered to diagnose your condition. Tests may include X-rays or other imaging studies of your knee. You may also need to have fluid removed from your knee. Treatment for ongoing knee pain depends on the cause, but treatment may include:  Medicines to relieve pain or swelling.  Steroid injections in your knee.  Physical therapy.  Surgery. HOME CARE INSTRUCTIONS  Take medicines only as directed by your health care provider.  Rest your knee and keep it raised (elevated) while you are resting.  Do not do things that cause or worsen pain.  Avoid high-impact activities or exercises, such as running, jumping rope, or doing jumping  jacks.  Apply ice to the knee area:  Put ice in a plastic bag.  Place a towel between your skin and the bag.  Leave the ice on for 20 minutes, 2-3 times a day.  Ask your health care provider if you should wear an elastic knee support.  Keep a pillow under your knee when you sleep.  Lose weight if you are overweight. Extra weight can put pressure on your knee.  Do not use any tobacco products, including cigarettes, chewing tobacco, or electronic cigarettes. If you need help quitting, ask your health care provider. Smoking may slow the healing of any bone and joint problems that you may have. SEEK MEDICAL CARE IF:  Your knee pain continues, changes, or gets worse.  You have a fever along with knee pain.  Your knee buckles or locks up.  Your knee becomes more swollen. SEEK IMMEDIATE MEDICAL CARE IF:   Your knee joint feels hot to the touch.  You have chest pain or trouble breathing.   This information is not intended to replace advice given to you by your health care provider. Make sure you discuss any questions you have with your health care provider.   Document Released: 01/20/2007 Document Revised: 04/15/2014 Document Reviewed: 11/08/2013 Elsevier Interactive Patient Education [employer].

## 2015-05-18 LAB — MICROALBUMIN, URINE: Microalb, Ur: 83.3 mg/dL

## 2015-05-19 DIAGNOSIS — N529 Male erectile dysfunction, unspecified: Secondary | ICD-10-CM | POA: Diagnosis not present

## 2015-05-19 DIAGNOSIS — C61 Malignant neoplasm of prostate: Secondary | ICD-10-CM | POA: Diagnosis not present

## 2015-05-23 DIAGNOSIS — E663 Overweight: Secondary | ICD-10-CM | POA: Diagnosis not present

## 2015-05-23 DIAGNOSIS — R6882 Decreased libido: Secondary | ICD-10-CM | POA: Diagnosis not present

## 2015-05-23 DIAGNOSIS — N521 Erectile dysfunction due to diseases classified elsewhere: Secondary | ICD-10-CM | POA: Diagnosis not present

## 2015-05-23 DIAGNOSIS — E291 Testicular hypofunction: Secondary | ICD-10-CM | POA: Diagnosis not present

## 2015-05-24 ENCOUNTER — Encounter: Payer: Self-pay | Admitting: Physician Assistant

## 2015-06-13 ENCOUNTER — Other Ambulatory Visit: Payer: Self-pay | Admitting: Family Medicine

## 2015-06-17 ENCOUNTER — Telehealth: Payer: Self-pay

## 2015-06-17 NOTE — Telephone Encounter (Signed)
Patient states his medication for insulin went from $40 to $110.   Is there something else he can take?    [phone]

## 2015-06-19 ENCOUNTER — Telehealth: Payer: Self-pay

## 2015-06-19 NOTE — Telephone Encounter (Signed)
70/30 insulin may be the least expensive option, but can have pharmacist check into the cost of Lantus or other long-acting insulin at low dose. Can we  please check into other options through his pharmacy for options with his insurance?

## 2015-06-19 NOTE — Telephone Encounter (Signed)
Please advise 

## 2015-06-19 NOTE — Telephone Encounter (Signed)
Pt states he went to get his medicine and was told his medicine went up from $43.00 to $133.00 so he would like to know what medicine it was and he wasn't sure Please call [place]

## 2015-06-19 NOTE — Telephone Encounter (Signed)
Pt called to check the status of return call request/// advised PT that this is the same rx (formulary and dosage) that has been prescribed for him in the past (03/2015)// advised PT to reach out to insurance carrier to explain the change and if there is a lower cost alternative that he and Dr. Carlota Raspberry can discuss.

## 2015-06-21 NOTE — Telephone Encounter (Signed)
Ebony Hail at 06/19/2015 5:49 PM     Status: Signed       Expand All Collapse All   Pt called to check the status of return call request/// advised PT that this is the same rx (formulary and dosage) that has been prescribed for him in the past (03/2015)// advised PT to reach out to insurance carrier to explain the change and if there is a lower cost alternative that he and Dr. Carlota Raspberry can discuss.

## 2015-06-23 NOTE — Telephone Encounter (Signed)
Called to get update. VM full.

## 2015-06-23 NOTE — Telephone Encounter (Signed)
Pt called from [facility] stating-he got his answer and he got his insulin filled. It did cost more.

## 2015-08-03 ENCOUNTER — Telehealth: Payer: Self-pay

## 2015-08-03 MED ORDER — METFORMIN HCL 1000 MG PO TABS: 1000.0000 mg | ORAL_TABLET | Freq: Two times a day (BID) | ORAL | Status: DC

## 2015-08-03 NOTE — Telephone Encounter (Signed)
The patient called to request for metFORMIN (GLUCOPHAGE) 1000 MG tablet to be sent to the [facility] mail order pharmacy, 90 day supply.  CB#: [phone]

## 2015-08-03 NOTE — Telephone Encounter (Signed)
rx sent in for 3 months.    No answer/ vm full

## 2015-08-04 NOTE — Telephone Encounter (Signed)
VM still full

## 2015-08-04 NOTE — Telephone Encounter (Signed)
Patient called - informed him about his Metformin refill.

## 2015-08-09 ENCOUNTER — Ambulatory Visit (INDEPENDENT_AMBULATORY_CARE_PROVIDER_SITE_OTHER): Payer: Commercial Managed Care - HMO | Admitting: Family Medicine

## 2015-08-09 ENCOUNTER — Encounter: Payer: Self-pay | Admitting: Family Medicine

## 2015-08-09 VITALS — BP 130/70 | HR 69 | Temp 97.9°F | Resp 16 | Ht 69.0 in | Wt 219.6 lb

## 2015-08-09 DIAGNOSIS — Z13220 Encounter for screening for lipoid disorders: Secondary | ICD-10-CM

## 2015-08-09 DIAGNOSIS — E1165 Type 2 diabetes mellitus with hyperglycemia: Secondary | ICD-10-CM

## 2015-08-09 DIAGNOSIS — IMO0001 Reserved for inherently not codable concepts without codable children: Secondary | ICD-10-CM

## 2015-08-09 DIAGNOSIS — Z794 Long term (current) use of insulin: Secondary | ICD-10-CM

## 2015-08-09 LAB — POCT GLYCOSYLATED HEMOGLOBIN (HGB A1C): Hemoglobin A1C: 8.1

## 2015-08-09 LAB — GLUCOSE, POCT (MANUAL RESULT ENTRY): POC Glucose: 174 mg/dl — AB (ref 70–99)

## 2015-08-09 MED ORDER — INSULIN NPH ISOPHANE & REGULAR (70-30) 100 UNIT/ML ~~LOC~~ SUSP: 7.0000 [IU] | Freq: Two times a day (BID) | SUBCUTANEOUS | Status: DC

## 2015-08-09 NOTE — Patient Instructions (Addendum)
     IF you received an x-ray today, you will receive an invoice from [facility]. Please contact [facility] at [phone] with questions or concerns regarding your invoice.   IF you received labwork today, you will receive an invoice from [employer]. Please contact [facility] at [phone] with questions or concerns regarding your invoice.   Our billing staff will not be able to assist you with questions regarding bills from these companies.  You will be contacted with the lab results as soon as they are available. The fastest way to get your results is to activate your My Chart account. Instructions are located on the last page of this paperwork. If you have not heard from [place] regarding the results in 2 weeks, please contact this office.    I recommend against using testosterone supplement until cleared by urologist, as you have a history of prostate cancer I usually want to see multiple low readings in the morning to verify low testosterone.  Follow up with eye doctor once per year for diabetes.   Work on diet, but for now can increase your 70/30 insulin to 7 units twice per day. Continue metformin same dose.   Return for fasting labs only in next few weeks. [place] office visit in next 3 months.

## 2015-08-09 NOTE — Progress Notes (Signed)
By signing my name below, I, [name], attest that this documentation has been prepared under the direction and in the presence of [name], MD.  Electronically Signed: [name], Medical Scribe. 08/09/2015. 1:49 PM.  Subjective:    Patient ID: [name], male    DOB: [birth year], 82 y.o.   MRN: [id number]  HPI Chief Complaint  Patient presents with  . Follow-up  . Diabetes   HPI Comments: [name] is a 82 y.o. male who presents to the [facility] here for follow-up for DM. Insulin was inc to 6 units of 70/30 insulin BID in Dec. Continued same does of meds at officie visit in Feb. He is on a statin, metformin, and insulin. Pt reports regularly taking his medications with meals. Pt reports his blood sugars have been about 110 at home; with the lowest at 90, and the highest 118. He mentions never feeling complications with his DM. Pt reports poor diet. Pt mentions exercising everyday with his bicycle. He last saw his opthalmologist about a year ago.  Lab Results  Component Value Date   HGBA1C 8.1 08/09/2015   Lab Results  Component Value Date   MICROALBUR 83.3 05/17/2015   Hx of Prostate Cancer: Erectile dysfunction has treated with Viagra previously. Outside not received regarding possible topicle testosterone treatment. Plan on recommended he discuss with urologist first. Last checked testosterone level was outside the office after lunch in the 200 range and was advised testosterone cream.  Patient Active Problem List   Diagnosis Date Noted  . Blood in the urine 12/01/2013  . Cystitis, radiation 12/01/2013  . Malignant neoplasm of prostate ([place]) 11/26/2013  . Pulsatile tinnitus 11/19/2013  . Rectal bleeding 12/23/2012  . Type II or unspecified type diabetes mellitus without mention of complication, not stated as uncontrolled   . Essential hypertension, benign   . Chronic back pain   . ED (erectile dysfunction)   . Anemia   .  Hyperlipidemia   . CAD, NATIVE VESSEL 05/08/2008  . VENTRICULAR TACHYCARDIA 05/08/2008  . PREMATURE VENTRICULAR CONTRACTIONS 05/08/2008   Past Medical History  Diagnosis Date  . Type II or unspecified type diabetes mellitus without mention of complication, not stated as uncontrolled   . Essential hypertension, benign   . Chronic back pain   . ED (erectile dysfunction)   . Prostate cancer [facility])     s/p  . CAD (coronary artery disease)     pci to LAD 10/09; stable CAD by cath 05/31/08; myoview 04/11/11- no ishcemia, EF 67%; echo 05/15/09- EF>55%, mod calcification of the aortic valve leaflets  . Ventricular tachycardia ([place])     resuscitated; monitor 05/2008; attempted T ablation 2/10- aborted due to inappropriate substrate  . Hyperlipidemia   . Allergy    Past Surgical History  Procedure Laterality Date  . Ptca  01/2008  . Colectomy  '80's    polyps  . Coronary stent placement  01/19/08    PCI stent to mid LAD with Promus DES 27.5x28  . Cardiac catheterization  05/31/08    EF 55-60%, stable lesions, medical therapy   No Known Allergies Prior to Admission medications   Medication Sig Start Date End Date Taking? Authorizing Provider  atorvastatin (LIPITOR) 10 MG tablet TAKE 1 TABLET EVERY DAY 04/07/15  Yes [name], MD  glucose blood (TRUETEST TEST) test strip USE AS DIRECTED THREE TIMES DAILY 06/21/13  Yes [name], MD  insulin NPH-regular Human (NOVOLIN 70/30) (70-30)  100 UNIT/ML injection Inject 6 Units into the skin 2 (two) times daily with a meal. 03/27/15  Yes [name], MD  insulin NPH-regular Human (NOVOLIN 70/30) (70-30) 100 UNIT/ML injection Inject 6 Units into the skin 2 (two) times daily. 06/16/15  Yes [name], MD  Insulin Syringe-Needle U-100 (B-D INS SYR HALF-UNIT .3CC/31G) 31G X 5/16" 0.3 ML MISC Use daily at bedtime to inject insulin. Dx code: 250.00 06/21/13  Yes [name], MD  lisinopril (PRINIVIL,ZESTRIL) 20 MG tablet TAKE 1 TABLET  EVERY DAY 04/07/15  Yes [name], MD  lisinopril-hydrochlorothiazide (PRINZIDE,ZESTORETIC) 20-12.5 MG tablet TAKE 1 TABLET BY MOUTH DAILY. 04/07/15  Yes [name], MD  metFORMIN (GLUCOPHAGE) 1000 MG tablet Take 1 tablet (1,000 mg total) by mouth 2 (two) times daily with a meal. 08/03/15  Yes [name], MD  metoprolol (LOPRESSOR) 50 MG tablet TAKE 1 TABLET TWICE DAILY 04/07/15  Yes [name], MD  Multiple Vitamin (MULTIVITAMIN) tablet Take 1 tablet by mouth daily.   Yes Historical Provider, MD  Omega-3 Fatty Acids (FISH OIL) 1000 MG CAPS Take by mouth.   Yes Historical Provider, MD  sildenafil (VIAGRA) 50 MG tablet Take 1 tablet (50 mg total) by mouth daily as needed for erectile dysfunction. 02/08/15  Yes Lorretta Harp, MD  Vitamin D, Cholecalciferol, 1000 UNITS TABS Take by mouth daily.    Historical Provider, MD   Social History   Social History  . Marital Status: Legally Separated    Spouse Name: [name]  . Number of Children: 7  . Years of Education: [name]   Occupational History  . RETIRED    Social History Main Topics  . Smoking status: Former Smoker -- 0.50 packs/day for 15 years    Types: Cigarettes    Quit date: 09/01/1990  . Smokeless tobacco: Not on file     Comment: quit 12/07/2013  . Alcohol Use: No  . Drug Use: No  . Sexual Activity: No   Other Topics Concern  . Not on file   Social History Narrative   29 grandchildren. Single. Education: 12 th grade. Exercise: 3-4 times a week for 30 minutes working out and set ups.   Review of Systems  Constitutional: Negative for fatigue and unexpected weight change.  Eyes: Negative for visual disturbance.  Respiratory: Negative for cough, chest tightness and shortness of breath.   Cardiovascular: Negative for chest pain, palpitations and leg swelling.  Gastrointestinal: Negative for abdominal pain and blood in stool.  Neurological: Negative for dizziness, light-headedness and headaches.   Filed  Vitals:   08/09/15 1315  BP: 130/70  Pulse: 69  Temp: 97.9 F (36.6 C)  TempSrc: Oral  Resp: 16  Height: 5\' 9"  (1.753 m)  Weight: 219 lb 9.6 oz (99.61 kg)  SpO2: 99%   Objective:   Physical Exam  Constitutional: He is oriented to person, place, and time. He appears well-developed and well-nourished.  HENT:  Head: Normocephalic and atraumatic.  Eyes: EOM are normal. Pupils are equal, round, and reactive to light.  Neck: No JVD present. Carotid bruit is not present.  Cardiovascular: Normal rate, regular rhythm and normal heart sounds.   No murmur heard. Pulmonary/Chest: Effort normal and breath sounds normal. He has no rales.  Musculoskeletal: He exhibits no edema.  Neurological: He is alert and oriented to person, place, and time.  Skin: Skin is warm and dry.  Psychiatric: He has a normal mood and affect.  Vitals reviewed.  Results for orders placed or performed in visit on 08/09/15  POCT glucose (manual entry)  Result Value Ref Range   POC Glucose 174 (A) 70 - 99 mg/dl  POCT glycosylated hemoglobin (Hb A1C)  Result Value Ref Range   Hemoglobin A1C 8.1     Assessment & Plan:   [name] is a 82 y.o. male Uncontrolled type 2 diabetes mellitus without complication, with long-term current use of insulin ([place]) - Plan: POCT glucose (manual entry), POCT glycosylated hemoglobin (Hb A1C), HM Diabetes Foot Exam, insulin NPH-regular Human (NOVOLIN 70/30) (70-30) 100 UNIT/ML injection  -Still uncontrolled. A1c in numbers in office do not seem to fit with his home readings. Advised to check meter against our readings next visit. Work on diet, but will increase his insulin to 8 units twice per day of 70/30. Recheck 3 months.  Recommended against use of testosterone supplementation until he has discussed this with his urologist, as history of prostate cancer. Discussed that usually would want to see 2 low readings, and should be morning blood draws.  Screening for hyperlipidemia -  Plan: Lipid panel, COMPLETE METABOLIC PANEL WITH GFR  -Return for fasting labs in the next few weeks. Continue Lipitor for now.  Meds ordered this encounter  Medications  . insulin NPH-regular Human (NOVOLIN 70/30) (70-30) 100 UNIT/ML injection    Sig: Inject 7 Units into the skin 2 (two) times daily with a meal.    Dispense:  10 mL    Refill:  2   Patient Instructions       IF you received an x-ray today, you will receive an invoice from [facility]. Please contact [facility] at [phone] with questions or concerns regarding your invoice.   IF you received labwork today, you will receive an invoice from [employer]. Please contact [facility] at [phone] with questions or concerns regarding your invoice.   Our billing staff will not be able to assist you with questions regarding bills from these companies.  You will be contacted with the lab results as soon as they are available. The fastest way to get your results is to activate your My Chart account. Instructions are located on the last page of this paperwork. If you have not heard from [place] regarding the results in 2 weeks, please contact this office.    I recommend against using testosterone supplement until cleared by urologist, as you have a history of prostate cancer I usually want to see multiple low readings in the morning to verify low testosterone.  Follow up with eye doctor once per year for diabetes.   Work on diet, but for now can increase your 70/30 insulin to 7 units twice per day. Continue metformin same dose.   Return for fasting labs only in next few weeks. [place] office visit in next 3 months.       I personally performed the services described in this documentation, which was scribed in my presence. The recorded information has been reviewed and considered, and addended by me as needed.

## 2015-08-14 ENCOUNTER — Other Ambulatory Visit: Payer: Self-pay | Admitting: Family Medicine

## 2015-08-16 ENCOUNTER — Ambulatory Visit: Payer: Commercial Managed Care - HMO | Admitting: Family Medicine

## 2015-08-30 ENCOUNTER — Other Ambulatory Visit (INDEPENDENT_AMBULATORY_CARE_PROVIDER_SITE_OTHER): Payer: Commercial Managed Care - HMO

## 2015-08-30 DIAGNOSIS — Z13220 Encounter for screening for lipoid disorders: Secondary | ICD-10-CM | POA: Diagnosis not present

## 2015-08-30 DIAGNOSIS — Z79899 Other long term (current) drug therapy: Secondary | ICD-10-CM | POA: Diagnosis not present

## 2015-08-30 LAB — COMPLETE METABOLIC PANEL WITH GFR
ALT: 9 U/L (ref 9–46)
AST: 13 U/L (ref 10–35)
Albumin: 3.5 g/dL — ABNORMAL LOW (ref 3.6–5.1)
Alkaline Phosphatase: 34 U/L — ABNORMAL LOW (ref 40–115)
BUN: 15 mg/dL (ref 7–25)
CO2: 24 mmol/L (ref 20–31)
Calcium: 8.6 mg/dL (ref 8.6–10.3)
Chloride: 105 mmol/L (ref 98–110)
Creat: 1.33 mg/dL — ABNORMAL HIGH (ref 0.70–1.18)
GFR, Est African American: 58 mL/min — ABNORMAL LOW (ref >=60)
GFR, Est Non African American: 50 mL/min — ABNORMAL LOW (ref >=60)
Glucose, Bld: 115 mg/dL — ABNORMAL HIGH (ref 65–99)
Potassium: 4.7 mmol/L (ref 3.5–5.3)
Sodium: 140 mmol/L (ref 135–146)
Total Bilirubin: 0.5 mg/dL (ref 0.2–1.2)
Total Protein: 6 g/dL — ABNORMAL LOW (ref 6.1–8.1)

## 2015-08-30 LAB — LIPID PANEL
Cholesterol: 136 mg/dL (ref 125–200)
HDL: 48 mg/dL (ref >=40)
LDL Cholesterol: 68 mg/dL (ref <130)
Total CHOL/HDL Ratio: 2.8 Ratio (ref <=5.0)
Triglycerides: 102 mg/dL (ref <150)
VLDL: 20 mg/dL (ref <30)

## 2015-09-15 ENCOUNTER — Telehealth: Payer: Self-pay | Admitting: Emergency Medicine

## 2015-09-15 NOTE — Telephone Encounter (Signed)
-----   Message from [name], MD sent at 09/13/2015 11:03 PM EDT ----- Call patient.  Cholesterol from last visit looked okay. Electrolytes were overall stable, but kidney function slightly higher than 8 months ago (but only slight increase). Can recheck this at next visit in 3 months, make sure he is staying well hydrated, avoid NSAIDs like Advil or Aleve. Let me know if there are any questions.

## 2015-09-15 NOTE — Telephone Encounter (Signed)
Unable to leave message Voicemail full Will try again

## 2015-11-16 ENCOUNTER — Ambulatory Visit (INDEPENDENT_AMBULATORY_CARE_PROVIDER_SITE_OTHER): Payer: Commercial Managed Care - HMO | Admitting: Family Medicine

## 2015-11-16 ENCOUNTER — Encounter: Payer: Self-pay | Admitting: Family Medicine

## 2015-11-16 VITALS — BP 122/70 | HR 66 | Temp 98.0°F | Resp 18 | Ht 69.0 in | Wt 220.0 lb

## 2015-11-16 DIAGNOSIS — Z794 Long term (current) use of insulin: Secondary | ICD-10-CM

## 2015-11-16 DIAGNOSIS — E1165 Type 2 diabetes mellitus with hyperglycemia: Secondary | ICD-10-CM

## 2015-11-16 DIAGNOSIS — I10 Essential (primary) hypertension: Secondary | ICD-10-CM

## 2015-11-16 DIAGNOSIS — E119 Type 2 diabetes mellitus without complications: Secondary | ICD-10-CM

## 2015-11-16 DIAGNOSIS — E785 Hyperlipidemia, unspecified: Secondary | ICD-10-CM

## 2015-11-16 DIAGNOSIS — R748 Abnormal levels of other serum enzymes: Secondary | ICD-10-CM | POA: Diagnosis not present

## 2015-11-16 DIAGNOSIS — N529 Male erectile dysfunction, unspecified: Secondary | ICD-10-CM

## 2015-11-16 DIAGNOSIS — R7989 Other specified abnormal findings of blood chemistry: Secondary | ICD-10-CM

## 2015-11-16 DIAGNOSIS — IMO0001 Reserved for inherently not codable concepts without codable children: Secondary | ICD-10-CM

## 2015-11-16 LAB — BASIC METABOLIC PANEL
BUN: 17 mg/dL (ref 7–25)
CO2: 23 mmol/L (ref 20–31)
Calcium: 9 mg/dL (ref 8.6–10.3)
Chloride: 105 mmol/L (ref 98–110)
Creat: 1.37 mg/dL — ABNORMAL HIGH (ref 0.70–1.18)
Glucose, Bld: 129 mg/dL — ABNORMAL HIGH (ref 65–99)
Potassium: 4.3 mmol/L (ref 3.5–5.3)
Sodium: 138 mmol/L (ref 135–146)

## 2015-11-16 LAB — TESTOSTERONE: Testosterone: 233 ng/dL — ABNORMAL LOW (ref 250–827)

## 2015-11-16 MED ORDER — ATORVASTATIN CALCIUM 10 MG PO TABS: 10.0000 mg | ORAL_TABLET | Freq: Every day | ORAL | 1 refills | Status: DC

## 2015-11-16 MED ORDER — LISINOPRIL 20 MG PO TABS: 20.0000 mg | ORAL_TABLET | Freq: Every day | ORAL | 1 refills | Status: DC

## 2015-11-16 MED ORDER — METOPROLOL TARTRATE 50 MG PO TABS: 50.0000 mg | ORAL_TABLET | Freq: Two times a day (BID) | ORAL | 1 refills | Status: DC

## 2015-11-16 MED ORDER — LISINOPRIL-HYDROCHLOROTHIAZIDE 20-12.5 MG PO TABS: 1.0000 | ORAL_TABLET | Freq: Every day | ORAL | 1 refills | Status: DC

## 2015-11-16 MED ORDER — INSULIN NPH ISOPHANE & REGULAR (70-30) 100 UNIT/ML ~~LOC~~ SUSP: 7.0000 [IU] | Freq: Two times a day (BID) | SUBCUTANEOUS | 2 refills | Status: DC

## 2015-11-16 MED ORDER — METFORMIN HCL 1000 MG PO TABS: 1000.0000 mg | ORAL_TABLET | Freq: Two times a day (BID) | ORAL | 1 refills | Status: DC

## 2015-11-16 NOTE — Progress Notes (Signed)
By signing my name below, I, [name], attest that this documentation has been prepared under the direction and in the presence of [name], MD.  Electronically Signed: [name], Medical Scribe. 11/16/15. 10:52 AM.  Subjective:    Patient ID: [name], male    DOB: May 29, 1933, 82 y.o.   MRN: [id number]  HPI Chief Complaint  Patient presents with  . Follow-up    3 month follow up    HPI Comments: [name] is a 82 y.o. male who presents to the [facility] for follow-up. Pt's last visit was 5/3, still uncontrolled. He was increased to 7 units of 70/30 insulin BID. Pt's older brother passes away at 81 y/o last week.  Prostate CA: Pt has appointment with his urologist Dr. [name] with his PMHx of prostate on 8/16. Pt mentions it's slow. Pt denies taking Viagra.  DM: Takes Metformin 100 mg BID.  Pt's blood sugar has been running 99-101; the highest is 110, and the lowest is 97. Pt denies experiencing symptomatic lows, or dizzy spells. Pt denies experiencing negative side effects on his medication. Lab Results  Component Value Date   HGBA1C 8.1 08/09/2015   HLD: Takes Lipitor 20 mg QD Lab Results  Component Value Date   CHOL 136 08/30/2015   HDL 48 08/30/2015   LDLCALC 68 08/30/2015   TRIG 102 08/30/2015   CHOLHDL 2.8 08/30/2015   Lab Results  Component Value Date   ALT 9 08/30/2015   AST 13 08/30/2015   ALKPHOS 34 (L) 08/30/2015   BILITOT 0.5 08/30/2015   HTN: He is on a total of 40 mg Lisinopril and 12.5 mg HCTZ each day. He also takes Metoprolol 50 mg BID. Pt's bp readings have been good running. Inc creatinine from last test ranging from 1.16 -1.25. Pt has been drinking lots of water and denies NSAIDs. Lab Results  Component Value Date   CREATININE 1.33 (H) 08/30/2015   Right Knee: Pt reports soft spot on the right side of his right knee. Pt denies joint swelling.  Patient Active Problem List   Diagnosis Date Noted  .  Blood in the urine 12/01/2013  . Cystitis, radiation 12/01/2013  . Malignant neoplasm of prostate ([place]) 11/26/2013  . Pulsatile tinnitus 11/19/2013  . Rectal bleeding 12/23/2012  . Type II or unspecified type diabetes mellitus without mention of complication, not stated as uncontrolled   . Essential hypertension, benign   . Chronic back pain   . ED (erectile dysfunction)   . Anemia   . Hyperlipidemia   . CAD, NATIVE VESSEL 05/08/2008  . VENTRICULAR TACHYCARDIA 05/08/2008  . PREMATURE VENTRICULAR CONTRACTIONS 05/08/2008   Past Medical History:  Diagnosis Date  . Allergy   . CAD (coronary artery disease)    pci to LAD 10/09; stable CAD by cath 05/31/08; myoview 04/11/11- no ishcemia, EF 67%; echo 05/15/09- EF>55%, mod calcification of the aortic valve leaflets  . Chronic back pain   . ED (erectile dysfunction)   . Essential hypertension, benign   . Hyperlipidemia   . Prostate cancer ([place])    s/p  . Type II or unspecified type diabetes mellitus without mention of complication, not stated as uncontrolled   . Ventricular tachycardia ([place])    resuscitated; monitor 05/2008; attempted T ablation 2/10- aborted due to inappropriate substrate   Past Surgical History:  Procedure Laterality Date  . CARDIAC CATHETERIZATION  05/31/08   EF 55-60%, stable lesions, medical therapy  . COLECTOMY  '  80's   polyps  . CORONARY STENT PLACEMENT  01/19/08   PCI stent to mid LAD with Promus DES 27.5x28  . PTCA  01/2008   No Known Allergies Prior to Admission medications   Medication Sig Start Date End Date Taking? Authorizing Provider  atorvastatin (LIPITOR) 10 MG tablet TAKE 1 TABLET EVERY DAY 04/07/15  Yes [name], MD  glucose blood (TRUETEST TEST) test strip USE AS DIRECTED THREE TIMES DAILY 06/21/13  Yes [name], MD  insulin NPH-regular Human (NOVOLIN 70/30) (70-30) 100 UNIT/ML injection Inject 7 Units into the skin 2 (two) times daily with a meal. 08/09/15  Yes [name], MD    Insulin Syringe-Needle U-100 (B-D INS SYR HALF-UNIT .3CC/31G) 31G X 5/16" 0.3 ML MISC Use daily at bedtime to inject insulin. Dx code: 250.00 06/21/13  Yes [name], MD  lisinopril (PRINIVIL,ZESTRIL) 20 MG tablet TAKE 1 TABLET EVERY DAY 04/07/15  Yes [name], MD  lisinopril-hydrochlorothiazide (PRINZIDE,ZESTORETIC) 20-12.5 MG tablet TAKE 1 TABLET BY MOUTH DAILY. 04/07/15  Yes [name], MD  metFORMIN (GLUCOPHAGE) 1000 MG tablet TAKE 1 TABLET BY MOUTH TWICE DAILY WITH MEALS. 08/15/15  Yes [name], MD  metoprolol (LOPRESSOR) 50 MG tablet TAKE 1 TABLET TWICE DAILY 04/07/15  Yes [name], MD  Multiple Vitamin (MULTIVITAMIN) tablet Take 1 tablet by mouth daily.   Yes Historical Provider, MD  Omega-3 Fatty Acids (FISH OIL) 1000 MG CAPS Take by mouth.   Yes Historical Provider, MD  sildenafil (VIAGRA) 50 MG tablet Take 1 tablet (50 mg total) by mouth daily as needed for erectile dysfunction. 02/08/15  Yes Lorretta Harp, MD  Vitamin D, Cholecalciferol, 1000 UNITS TABS Take by mouth daily.   Yes Historical Provider, MD   Social History   Social History  . Marital status: Legally Separated    Spouse name: [name]  . Number of children: 7  . Years of education: [name]   Occupational History  . RETIRED Retired   Social History Main Topics  . Smoking status: Former Smoker    Packs/day: 0.50    Years: 15.00    Types: Cigarettes    Quit date: 09/01/1990  . Smokeless tobacco: Not on file     Comment: quit 12/07/2013  . Alcohol use No  . Drug use: No  . Sexual activity: No   Other Topics Concern  . Not on file   Social History Narrative   29 grandchildren. Single. Education: 12 th grade. Exercise: 3-4 times a week for 30 minutes working out and set ups.   Depression screen [facility] 2/9 11/16/2015 08/09/2015 05/17/2015 01/02/2015 10/03/2014  Decreased Interest 0 0 0 0 0  Down, Depressed, Hopeless 0 0 0 0 0  PHQ - 2 Score 0 0 0 0 0   Review of Systems  Constitutional:  Negative for fatigue and unexpected weight change.  Eyes: Negative for visual disturbance.  Respiratory: Negative for cough, chest tightness and shortness of breath.   Cardiovascular: Negative for chest pain, palpitations and leg swelling.  Gastrointestinal: Negative for abdominal pain and blood in stool.  Musculoskeletal: Negative for arthralgias and joint swelling.  Neurological: Negative for dizziness, light-headedness and headaches.   Objective:  Physical Exam  Constitutional: He is oriented to person, place, and time. He appears well-developed and well-nourished.  HENT:  Head: Normocephalic and atraumatic.  Eyes: EOM are normal. Pupils are equal, round, and reactive to light.  Neck: No JVD present. Carotid bruit is  not present.  Cardiovascular: Normal rate, regular rhythm and normal heart sounds.   No murmur heard. Pulmonary/Chest: Effort normal and breath sounds normal. He has no rales.  Musculoskeletal: He exhibits no edema.  Neurological: He is alert and oriented to person, place, and time.  Skin: Skin is warm and dry.  Psychiatric: He has a normal mood and affect.  Vitals reviewed.  BP 122/70   Pulse 66   Temp 98 F (36.7 C) (Oral)   Resp 18   Ht 5\' 9"  (1.753 m)   Wt 220 lb (99.8 kg)   SpO2 98%   BMI 32.49 kg/m   Assessment & Plan:   [name] is a 82 y.o. male Type 2 diabetes mellitus without complication, with long-term current use of insulin ([place]) - Plan: Hemoglobin A1C, Uncontrolled type 2 diabetes mellitus without complication, with long-term current use of insulin ([facility]) - Plan: insulin NPH-regular Human (NOVOLIN 70/30) (70-30) 100 UNIT/ML injection, metFORMIN (GLUCOPHAGE) 1000 MG tablet  -Check A1c, continue same dose of metformin and Lantus for now.  Elevated serum creatinine - Plan: Testosterone, Basic metabolic panel  -Repeat creatinine. Advised to maintain hydration throughout the day, avoid NSAIDs as possible.  Essential hypertension - Plan: Basic  metabolic panel, lisinopril (PRINIVIL,ZESTRIL) 20 MG tablet, lisinopril-hydrochlorothiazide (PRINZIDE,ZESTORETIC) 20-12.5 MG tablet, metoprolol (LOPRESSOR) 50 MG tablet  -Stable. No change in medications for now.  Hyperlipidemia - Plan: atorvastatin (LIPITOR) 10 MG tablet  Erectile dysfunction, unspecified erectile dysfunction type  -Check testosterone level, but with his history of prostate cancer, would defer to urology for treatment if low.     Meds ordered this encounter  Medications  . atorvastatin (LIPITOR) 10 MG tablet    Sig: Take 1 tablet (10 mg total) by mouth daily.    Dispense:  90 tablet    Refill:  1  . insulin NPH-regular Human (NOVOLIN 70/30) (70-30) 100 UNIT/ML injection    Sig: Inject 7 Units into the skin 2 (two) times daily with a meal.    Dispense:  10 mL    Refill:  2  . lisinopril (PRINIVIL,ZESTRIL) 20 MG tablet    Sig: Take 1 tablet (20 mg total) by mouth daily.    Dispense:  90 tablet    Refill:  1  . lisinopril-hydrochlorothiazide (PRINZIDE,ZESTORETIC) 20-12.5 MG tablet    Sig: Take 1 tablet by mouth daily.    Dispense:  90 tablet    Refill:  1  . metFORMIN (GLUCOPHAGE) 1000 MG tablet    Sig: Take 1 tablet (1,000 mg total) by mouth 2 (two) times daily with a meal.    Dispense:  180 tablet    Refill:  1  . metoprolol (LOPRESSOR) 50 MG tablet    Sig: Take 1 tablet (50 mg total) by mouth 2 (two) times daily.    Dispense:  180 tablet    Refill:  1   Patient Instructions       IF you received an x-ray today, you will receive an invoice from [facility]. Please contact [facility] at [phone] with questions or concerns regarding your invoice.   IF you received labwork today, you will receive an invoice from [employer]. Please contact [facility] at [phone] with questions or concerns regarding your invoice.   Our billing staff will not be able to assist you with questions regarding bills from these  companies.  You will be contacted with the lab results as soon as they are available. The fastest way to get  your results is to activate your My Chart account. Instructions are located on the last page of this paperwork. If you have not heard from [place] regarding the results in 2 weeks, please contact this office.     I will check your hemoglobin A1c today, and we will call you to let you know if any medication changes needed.  Make sure you drink plenty of fluids throughout the day, I will recheck your kidney function today.  I will check a testosterone level, but if this is low, needs to be repeated or discuss further with your urologist.  Follow-up in 3 months.   I personally performed the services described in this documentation, which was scribed in my presence. The recorded information has been reviewed and considered, and addended by me as needed.   Signed,   [name], MD [facility].  11/17/15 10:22 PM

## 2015-11-16 NOTE — Patient Instructions (Addendum)
     IF you received an x-ray today, you will receive an invoice from [facility]. Please contact [facility] at [phone] with questions or concerns regarding your invoice.   IF you received labwork today, you will receive an invoice from [employer]. Please contact [facility] at [phone] with questions or concerns regarding your invoice.   Our billing staff will not be able to assist you with questions regarding bills from these companies.  You will be contacted with the lab results as soon as they are available. The fastest way to get your results is to activate your My Chart account. Instructions are located on the last page of this paperwork. If you have not heard from [place] regarding the results in 2 weeks, please contact this office.     I will check your hemoglobin A1c today, and we will call you to let you know if any medication changes needed.  Make sure you drink plenty of fluids throughout the day, I will recheck your kidney function today.  I will check a testosterone level, but if this is low, needs to be repeated or discuss further with your urologist.  Follow-up in 3 months.

## 2015-11-17 LAB — HEMOGLOBIN A1C
Hgb A1c MFr Bld: 8.2 % — ABNORMAL HIGH (ref <5.7)
Mean Plasma Glucose: 189 mg/dL

## 2015-11-22 DIAGNOSIS — C61 Malignant neoplasm of prostate: Secondary | ICD-10-CM | POA: Diagnosis not present

## 2015-11-28 ENCOUNTER — Telehealth: Payer: Self-pay

## 2015-11-28 NOTE — Telephone Encounter (Signed)
Pt is neeiding a refill on his insulin  Best number [phone]

## 2015-11-30 DIAGNOSIS — N529 Male erectile dysfunction, unspecified: Secondary | ICD-10-CM | POA: Diagnosis not present

## 2015-11-30 DIAGNOSIS — C61 Malignant neoplasm of prostate: Secondary | ICD-10-CM | POA: Diagnosis not present

## 2015-12-09 ENCOUNTER — Ambulatory Visit (HOSPITAL_COMMUNITY)
Admission: RE | Admit: 2015-12-09 | Discharge: 2015-12-09 | Disposition: A | Discharge status: Home / Self Care | Payer: Commercial Managed Care - HMO | Source: Ambulatory Visit | Attending: Family Medicine | Admitting: Family Medicine

## 2015-12-09 ENCOUNTER — Ambulatory Visit (INDEPENDENT_AMBULATORY_CARE_PROVIDER_SITE_OTHER): Payer: Commercial Managed Care - HMO

## 2015-12-09 ENCOUNTER — Ambulatory Visit (INDEPENDENT_AMBULATORY_CARE_PROVIDER_SITE_OTHER): Payer: Commercial Managed Care - HMO | Admitting: Family Medicine

## 2015-12-09 VITALS — BP 140/80 | HR 68 | Temp 97.3°F | Resp 18 | Ht 69.0 in | Wt 220.0 lb

## 2015-12-09 DIAGNOSIS — R799 Abnormal finding of blood chemistry, unspecified: Secondary | ICD-10-CM | POA: Diagnosis not present

## 2015-12-09 DIAGNOSIS — R1012 Left upper quadrant pain: Secondary | ICD-10-CM | POA: Insufficient documentation

## 2015-12-09 DIAGNOSIS — R0789 Other chest pain: Secondary | ICD-10-CM

## 2015-12-09 DIAGNOSIS — S301XXA Contusion of abdominal wall, initial encounter: Secondary | ICD-10-CM

## 2015-12-09 DIAGNOSIS — R0781 Pleurodynia: Secondary | ICD-10-CM | POA: Diagnosis not present

## 2015-12-09 DIAGNOSIS — S299XXA Unspecified injury of thorax, initial encounter: Secondary | ICD-10-CM | POA: Diagnosis not present

## 2015-12-09 DIAGNOSIS — N400 Enlarged prostate without lower urinary tract symptoms: Secondary | ICD-10-CM | POA: Diagnosis not present

## 2015-12-09 DIAGNOSIS — S2242XA Multiple fractures of ribs, left side, initial encounter for closed fracture: Secondary | ICD-10-CM | POA: Diagnosis not present

## 2015-12-09 DIAGNOSIS — S2232XA Fracture of one rib, left side, initial encounter for closed fracture: Secondary | ICD-10-CM | POA: Diagnosis not present

## 2015-12-09 DIAGNOSIS — I2510 Atherosclerotic heart disease of native coronary artery without angina pectoris: Secondary | ICD-10-CM | POA: Diagnosis not present

## 2015-12-09 DIAGNOSIS — S20212A Contusion of left front wall of thorax, initial encounter: Secondary | ICD-10-CM | POA: Diagnosis not present

## 2015-12-09 DIAGNOSIS — I700 Atherosclerosis of aorta: Secondary | ICD-10-CM | POA: Insufficient documentation

## 2015-12-09 DIAGNOSIS — R109 Unspecified abdominal pain: Secondary | ICD-10-CM

## 2015-12-09 LAB — POCT I-STAT CREATININE: Creatinine, Ser: 1.4 mg/dL — ABNORMAL HIGH (ref 0.61–1.24)

## 2015-12-09 MED ORDER — ACETAMINOPHEN 500 MG PO TABS
1000.0000 mg | ORAL_TABLET | Freq: Once | ORAL | Status: AC
  Administered 2015-12-09: 1000 mg via ORAL

## 2015-12-09 MED ORDER — HYDROCODONE-ACETAMINOPHEN 5-325 MG PO TABS: 1.0000 | ORAL_TABLET | Freq: Four times a day (QID) | ORAL | 0 refills | Status: DC | PRN

## 2015-12-09 MED ORDER — IOPAMIDOL (ISOVUE-300) INJECTION 61%
100.0000 mL | Freq: Once | INTRAVENOUS | Status: AC | PRN
  Administered 2015-12-09: 80 mL via INTRAVENOUS

## 2015-12-09 NOTE — Progress Notes (Signed)
By signing my name below I, [name], attest that this documentation has been prepared under the direction and in the presence of Robert Agreste, MD. Electonically Signed. [name], Scribe 12/09/2015 at 12:11 PM    Subjective:    Patient ID: [name], male    DOB: 1934/02/26, 82 y.o.   MRN: [id number]  Chief Complaint  Patient presents with  . Fall    Fell this morning down four stair & c/o left hip & shoulder pain. No head injury    HPI [name] is a 82 y.o. male who presents to the [facility] complaining of left side pain that has constant since pt slipped down 4 outdoor steps at his home [place]. Pt states the steps were wet. Pt also reports SOB due to left side pain. Pt denies any bruising or lacerations. Pt denies hitting his head. Pt denies any LOC, neck pain, or abd pain.  Left shoulder pain is already starting to improve. Pt denies feeling lightheaded or dizzy prior to fall. Pt felt mildly lightheaded after the fall which resolved spontaneously within a few minutes.   History of prostate cancer, HLD, HTN, and DM.   Patient Active Problem List   Diagnosis Date Noted  . Blood in the urine 12/01/2013  . Cystitis, radiation 12/01/2013  . Malignant neoplasm of prostate ([place]) 11/26/2013  . Pulsatile tinnitus 11/19/2013  . Rectal bleeding 12/23/2012  . Type II or unspecified type diabetes mellitus without mention of complication, not stated as uncontrolled   . Essential hypertension, benign   . Chronic back pain   . ED (erectile dysfunction)   . Anemia   . Hyperlipidemia   . CAD, NATIVE VESSEL 05/08/2008  . VENTRICULAR TACHYCARDIA 05/08/2008  . PREMATURE VENTRICULAR CONTRACTIONS 05/08/2008   Past Medical History:  Diagnosis Date  . Allergy   . CAD (coronary artery disease)    pci to LAD 10/09; stable CAD by cath 05/31/08; myoview 04/11/11- no ishcemia, EF 67%; echo 05/15/09- EF>55%, mod calcification of the aortic valve leaflets  .  Chronic back pain   . ED (erectile dysfunction)   . Essential hypertension, benign   . Hyperlipidemia   . Prostate cancer ([place])    s/p  . Type II or unspecified type diabetes mellitus without mention of complication, not stated as uncontrolled   . Ventricular tachycardia ([place])    resuscitated; monitor 05/2008; attempted T ablation 2/10- aborted due to inappropriate substrate   Past Surgical History:  Procedure Laterality Date  . CARDIAC CATHETERIZATION  05/31/08   EF 55-60%, stable lesions, medical therapy  . COLECTOMY  '80's   polyps  . CORONARY STENT PLACEMENT  01/19/08   PCI stent to mid LAD with Promus DES 27.5x28  . PTCA  01/2008   No Known Allergies Prior to Admission medications   Medication Sig Start Date End Date Taking? Authorizing Provider  atorvastatin (LIPITOR) 10 MG tablet Take 1 tablet (10 mg total) by mouth daily. 11/16/15  Yes Robert Agreste, MD  glucose blood (TRUETEST TEST) test strip USE AS DIRECTED THREE TIMES DAILY 06/21/13  Yes Robert Agreste, MD  insulin NPH-regular Human (NOVOLIN 70/30) (70-30) 100 UNIT/ML injection Inject 7 Units into the skin 2 (two) times daily with a meal. 11/16/15  Yes Robert Agreste, MD  Insulin Syringe-Needle U-100 (B-D INS SYR HALF-UNIT .3CC/31G) 31G X 5/16" 0.3 ML MISC Use daily at bedtime to inject insulin. Dx code: 250.00 06/21/13  Yes Dellis Filbert  Valora Piccolo, MD  lisinopril (PRINIVIL,ZESTRIL) 20 MG tablet Take 1 tablet (20 mg total) by mouth daily. 11/16/15  Yes Robert Agreste, MD  lisinopril-hydrochlorothiazide (PRINZIDE,ZESTORETIC) 20-12.5 MG tablet Take 1 tablet by mouth daily. 11/16/15  Yes Robert Agreste, MD  metFORMIN (GLUCOPHAGE) 1000 MG tablet Take 1 tablet (1,000 mg total) by mouth 2 (two) times daily with a meal. 11/16/15  Yes Robert Agreste, MD  metoprolol (LOPRESSOR) 50 MG tablet Take 1 tablet (50 mg total) by mouth 2 (two) times daily. 11/16/15  Yes Robert Agreste, MD  Multiple Vitamin (MULTIVITAMIN) tablet Take 1 tablet  by mouth daily.   Yes Historical Provider, MD  Omega-3 Fatty Acids (FISH OIL) 1000 MG CAPS Take by mouth.   Yes Historical Provider, MD  sildenafil (VIAGRA) 50 MG tablet Take 1 tablet (50 mg total) by mouth daily as needed for erectile dysfunction. 02/08/15  Yes Lorretta Harp, MD  Vitamin D, Cholecalciferol, 1000 UNITS TABS Take by mouth daily.   Yes Historical Provider, MD   Social History   Social History  . Marital status: Legally Separated    Spouse name: [name]  . Number of children: 7  . Years of education: [name]   Occupational History  . RETIRED Retired   Social History Main Topics  . Smoking status: Former Smoker    Packs/day: 0.50    Years: 15.00    Types: Cigarettes    Quit date: 09/01/1990  . Smokeless tobacco: Not on file     Comment: quit 12/07/2013  . Alcohol use No  . Drug use: No  . Sexual activity: No   Other Topics Concern  . Not on file   Social History Narrative   29 grandchildren. Single. Education: 12 th grade. Exercise: 3-4 times a week for 30 minutes working out and set ups.      Review of Systems  Respiratory: Positive for shortness of breath.   Musculoskeletal:       Positive for left shoulder, left hip, and left rib pain.  Skin: Negative for wound.  Neurological: Negative for headaches.       Objective:   Physical Exam  Constitutional: He is oriented to person, place, and time. He appears well-developed and well-nourished. No distress.  HENT:  Head: Normocephalic and atraumatic.  Eyes: Conjunctivae are normal. Pupils are equal, round, and reactive to light.  Neck: Normal range of motion. Neck supple. No spinous process tenderness and no muscular tenderness present. Normal range of motion present.  Cardiovascular: Normal rate, regular rhythm and normal heart sounds.  Exam reveals no gallop and no friction rub.   No murmur heard. Pulmonary/Chest: Effort normal and breath sounds normal. No respiratory distress. He has no decreased breath  sounds. He has no wheezes. He has no rhonchi. He has no rales.  Abdominal: Soft. Normal appearance and bowel sounds are normal. He exhibits no distension. There is tenderness (mild) in the left upper quadrant. There is CVA tenderness (slight left CVA tenderness). There is no rigidity, no rebound and no guarding.  Negative cullen's and negative grey turner's sign.  Musculoskeletal: Normal range of motion.  Pt is tender along left posterior chest wall along left scapula. Left clavicle, left AC joint, and left Garwood joint is non-tender. Left shoulder ROM is intact, no bony tenderness.  Left hip full ROM with no pain, including internal rotation.   Neurological: He is alert and oriented to person, place, and time.  Skin: Skin is warm and dry. He  is not diaphoretic.  Psychiatric: He has a normal mood and affect. His behavior is normal.  Nursing note and vitals reviewed.    Vitals:   12/09/15 1010  BP: 140/80  Pulse: 68  Resp: 18  Temp: 97.3 F (36.3 C)  TempSrc: Oral  SpO2: 100%  Weight: 220 lb (99.8 kg)  Height: 5\' 9"  (1.753 m)   Dg Ribs Unilateral W/chest Left  Result Date: 12/09/2015 CLINICAL DATA:  [place] down stairs this morning. Left lateral and posterior rib pain. EXAM: LEFT RIBS AND CHEST - 3+ VIEW COMPARISON:  Chest x-ray 03/21/2010 FINDINGS: The cardiac silhouette, mediastinal and hilar contours are normal and stable. There is mild tortuosity and calcification of the thoracic aorta. The lungs are clear. Minimal streaky bibasilar atelectasis. No pleural effusion or pneumothorax. Dedicated views of the left ribs do not demonstrate any definite acute rib fractures. IMPRESSION: No acute cardiopulmonary findings. Low lung volumes with minimal streaky bibasilar atelectasis. No definite acute rib fractures. Electronically Signed   By: Marijo Sanes M.D.   On: 12/09/2015 11:23   Results for orders placed or performed in visit on 11/16/15  Hemoglobin A1C  Result Value Ref Range   Hgb A1c MFr  Bld 8.2 (H) <5.7 %   Mean Plasma Glucose 189 mg/dL  Testosterone  Result Value Ref Range   Testosterone 233 (L) 250 - 827 ng/dL  Basic metabolic panel  Result Value Ref Range   Sodium 138 135 - 146 mmol/L   Potassium 4.3 3.5 - 5.3 mmol/L   Chloride 105 98 - 110 mmol/L   CO2 23 20 - 31 mmol/L   Glucose, Bld 129 (H) 65 - 99 mg/dL   BUN 17 7 - 25 mg/dL   Creat 1.37 (H) 0.70 - 1.18 mg/dL   Calcium 9.0 8.6 - 10.3 mg/dL    Dg Ribs Unilateral W/chest Left  Result Date: 12/09/2015 CLINICAL DATA:  [place] down stairs this morning. Left lateral and posterior rib pain. EXAM: LEFT RIBS AND CHEST - 3+ VIEW COMPARISON:  Chest x-ray 03/21/2010 FINDINGS: The cardiac silhouette, mediastinal and hilar contours are normal and stable. There is mild tortuosity and calcification of the thoracic aorta. The lungs are clear. Minimal streaky bibasilar atelectasis. No pleural effusion or pneumothorax. Dedicated views of the left ribs do not demonstrate any definite acute rib fractures. IMPRESSION: No acute cardiopulmonary findings. Low lung volumes with minimal streaky bibasilar atelectasis. No definite acute rib fractures. Electronically Signed   By: Marijo Sanes M.D.   On: 12/09/2015 11:23   Ct Abdomen Pelvis W Contrast  Result Date: 12/09/2015 CLINICAL DATA:  Abdominal contusion. Left upper quadrant pain. Fell down stairs today. EXAM: CT ABDOMEN AND PELVIS WITH CONTRAST TECHNIQUE: Multidetector CT imaging of the abdomen and pelvis was performed using the standard protocol following bolus administration of intravenous contrast. CONTRAST:  21mL ISOVUE-300 IOPAMIDOL (ISOVUE-300) INJECTION 61% COMPARISON:  None. FINDINGS: Lower chest: The lung bases are clear of acute process. There is dependent atelectasis and edema. No pulmonary contusions, pleural effusion or pneumothorax. The heart is mildly enlarged. No pericardial effusion. Three-vessel coronary artery calcifications are noted. Moderate aortic calcifications.  Hepatobiliary: No focal hepatic lesions or intrahepatic biliary dilatation. No acute injury is identified. Gallbladder surgically absent. No common bile duct dilatation. Pancreas: No mass, inflammation, duct dilatation or acute injury. Spleen: Normal size. No focal lesions. No acute injury is identified despite the adjacent displaced rib fractures. Adrenals/Urinary Tract: The adrenal glands and kidneys are unremarkable and stable. No acute injury. No  renal lesion or hydronephrosis. Stomach/Bowel: The stomach, duodenum, small bowel and colon are grossly normal without oral contrast. No inflammatory changes, mass lesions or obstructive findings. There are surgical changes involving the left colon. The terminal ileum and appendix are normal. Vascular/Lymphatic: Moderate atherosclerotic calcifications involving the aorta and branch vessels. No dissection or focal aneurysm. The major venous structures are patent. No mesenteric or retroperitoneal mass or adenopathy. Other: No pelvic mass or hematoma. Mild bladder distention. Moderate prostate gland enlargement. There is median lobe hypertrophy. Surgical clips noted in the deep pelvis. No inguinal mass or adenopathy. No abdominal wall hernia or subcutaneous lesions. Surgical changes noted in the left abdomen under the anterior abdominal wall. Musculoskeletal: There are left lower rib fractures noted. The ninth, tenth and eleventh left posterior lateral ribs are fractured. Mild displacement of the eleventh rib fracture. The tenth rib is fractured in 2 places but no displacement. The ninth rib is fractured and displaced. IMPRESSION: 1. No acute abdominal/pelvic findings, mass lesions or adenopathy. No acute injury is identified. 2. Left ninth, tenth and eleventh posterior lateral rib fractures are noted and likely account for the patient's left flank pain. 3. Stable atherosclerotic calcifications involving the aorta and branch vessels and three-vessel coronary artery  calcifications. 4. Stable mild prostate gland enlargement with median lobe hypertrophy and mild distention of the bladder. Electronically Signed   By: Marijo Sanes M.D.   On: 12/09/2015 13:30        Assessment & Plan:    SHAMARCUS INGRAM is a 82 y.o. male Abdominal contusion, initial encounter - Plan: acetaminophen (TYLENOL) tablet 1,000 mg, CT Abdomen Pelvis W Contrast  Left-sided chest wall pain - Plan: DG Ribs Unilateral W/Chest Left, acetaminophen (TYLENOL) tablet 1,000 mg, HYDROcodone-acetaminophen (NORCO/VICODIN) 5-325 MG tablet  LUQ abdominal pain - Plan: CT Abdomen Pelvis W Contrast  Acute left flank pain - Plan: CT Abdomen Pelvis W Contrast, HYDROcodone-acetaminophen (NORCO/VICODIN) 5-325 MG tablet  Elevated BUN - Plan: Basic Metabolic Panel  Rib contusion, left, initial encounter   FAll this morning onto stairs. Multiple rib fractures noted on CT scan, but no apparent involvement of kidneys or spleen. Symptomatically care discussed when he was in office, and Rx for hydrocodone given to take 1-2  every 6 hours as needed. Incentive spirometry discussed and follow-up in 3 days. ER precautions discussed if any worsening symptoms prior.   Meds ordered this encounter  Medications  . acetaminophen (TYLENOL) tablet 1,000 mg  . HYDROcodone-acetaminophen (NORCO/VICODIN) 5-325 MG tablet    Sig: Take 1-2 tablets by mouth every 6 (six) hours as needed for moderate pain.    Dispense:  20 tablet    Refill:  0   Patient Instructions   No fracture seen on x-ray. I will check a CT scan today to make sure that kidney and spleen look okay. See information below on rib contusion. Hydrocodone 1-2 every 6 hours as needed for pain, but make sure you're taking deep breaths throughout the day. Follow-up with me Tuesday of next week if needed, sooner or to the emergency room if worse. We will let you know the results of the CT scan later today.   Rib Contusion A rib contusion is a deep bruise  on your rib area. Contusions are the result of a blunt trauma that causes bleeding and injury to the tissues under the skin. A rib contusion may involve bruising of the ribs and of the skin and muscles in the area. The skin overlying the contusion may turn  blue, purple, or yellow. Minor injuries will give you a painless contusion, but more severe contusions may stay painful and swollen for a few weeks. CAUSES  A contusion is usually caused by a blow, trauma, or direct force to an area of the body. This often occurs while playing contact sports. SYMPTOMS  Swelling and redness of the injured area.  Discoloration of the injured area.  Tenderness and soreness of the injured area.  Pain with or without movement. DIAGNOSIS  The diagnosis can be made by taking a medical history and performing a physical exam. An X-ray, CT scan, or MRI may be needed to determine if there were any associated injuries, such as broken bones (fractures) or internal injuries. TREATMENT  Often, the best treatment for a rib contusion is rest. Icing or applying cold compresses to the injured area may help reduce swelling and inflammation. Deep breathing exercises may be recommended to reduce the risk of partial lung collapse and pneumonia. Over-the-counter or prescription medicines may also be recommended for pain control. HOME CARE INSTRUCTIONS   Apply ice to the injured area:  Put ice in a plastic bag.  Place a towel between your skin and the bag.  Leave the ice on for 20 minutes, 2-3 times per day.  Take medicines only as directed by your health care provider.  Rest the injured area. Avoid strenuous activity and any activities or movements that cause pain. Be careful during activities and avoid bumping the injured area.  Perform deep-breathing exercises as directed by your health care provider.  Do not lift anything that is heavier than 5 lb (2.3 kg) until your health care provider approves.  Do not use any  tobacco products, including cigarettes, chewing tobacco, or electronic cigarettes. If you need help quitting, ask your health care provider. SEEK MEDICAL CARE IF:   You have increased bruising or swelling.  You have pain that is not controlled with treatment.  You have a fever. SEEK IMMEDIATE MEDICAL CARE IF:   You have difficulty breathing or shortness of breath.  You develop a continual cough, or you cough up thick or bloody sputum.  You feel sick to your stomach (nauseous), you throw up (vomit), or you have abdominal pain.   This information is not intended to replace advice given to you by your health care provider. Make sure you discuss any questions you have with your health care provider.   Document Released: 12/18/2000 Document Revised: 04/15/2014 Document Reviewed: 01/04/2014 Elsevier Interactive Patient Education 2016 [employer].     IF you received an x-ray today, you will receive an invoice from [facility]. Please contact [facility] at [phone] with questions or concerns regarding your invoice.   IF you received labwork today, you will receive an invoice from [employer]. Please contact [facility] at [phone] with questions or concerns regarding your invoice.   Our billing staff will not be able to assist you with questions regarding bills from these companies.  You will be contacted with the lab results as soon as they are available. The fastest way to get your results is to activate your My Chart account. Instructions are located on the last page of this paperwork. If you have not heard from [place] regarding the results in 2 weeks, please contact this office.        I personally performed the services described in this documentation, which was scribed in my presence. The recorded information has been reviewed and considered, and addended by me  as needed.   Signed,   [name], MD [facility].  12/09/15 2:05 PM

## 2015-12-09 NOTE — Patient Instructions (Addendum)
No fracture seen on x-ray. I will check a CT scan today to make sure that kidney and spleen look okay. See information below on rib contusion. Hydrocodone 1-2 every 6 hours as needed for pain, but make sure you're taking deep breaths throughout the day. Follow-up with me Tuesday of next week if needed, sooner or to the emergency room if worse. We will let you know the results of the CT scan later today.   Rib Contusion A rib contusion is a deep bruise on your rib area. Contusions are the result of a blunt trauma that causes bleeding and injury to the tissues under the skin. A rib contusion may involve bruising of the ribs and of the skin and muscles in the area. The skin overlying the contusion may turn blue, purple, or yellow. Minor injuries will give you a painless contusion, but more severe contusions may stay painful and swollen for a few weeks. CAUSES  A contusion is usually caused by a blow, trauma, or direct force to an area of the body. This often occurs while playing contact sports. SYMPTOMS  Swelling and redness of the injured area.  Discoloration of the injured area.  Tenderness and soreness of the injured area.  Pain with or without movement. DIAGNOSIS  The diagnosis can be made by taking a medical history and performing a physical exam. An X-ray, CT scan, or MRI may be needed to determine if there were any associated injuries, such as broken bones (fractures) or internal injuries. TREATMENT  Often, the best treatment for a rib contusion is rest. Icing or applying cold compresses to the injured area may help reduce swelling and inflammation. Deep breathing exercises may be recommended to reduce the risk of partial lung collapse and pneumonia. Over-the-counter or prescription medicines may also be recommended for pain control. HOME CARE INSTRUCTIONS   Apply ice to the injured area:  Put ice in a plastic bag.  Place a towel between your skin and the bag.  Leave the ice on for 20  minutes, 2-3 times per day.  Take medicines only as directed by your health care provider.  Rest the injured area. Avoid strenuous activity and any activities or movements that cause pain. Be careful during activities and avoid bumping the injured area.  Perform deep-breathing exercises as directed by your health care provider.  Do not lift anything that is heavier than 5 lb (2.3 kg) until your health care provider approves.  Do not use any tobacco products, including cigarettes, chewing tobacco, or electronic cigarettes. If you need help quitting, ask your health care provider. SEEK MEDICAL CARE IF:   You have increased bruising or swelling.  You have pain that is not controlled with treatment.  You have a fever. SEEK IMMEDIATE MEDICAL CARE IF:   You have difficulty breathing or shortness of breath.  You develop a continual cough, or you cough up thick or bloody sputum.  You feel sick to your stomach (nauseous), you throw up (vomit), or you have abdominal pain.   This information is not intended to replace advice given to you by your health care provider. Make sure you discuss any questions you have with your health care provider.   Document Released: 12/18/2000 Document Revised: 04/15/2014 Document Reviewed: 01/04/2014 Elsevier Interactive Patient Education 2016 [employer].     IF you received an x-ray today, you will receive an invoice from [facility]. Please contact [facility] at [phone] with questions or concerns regarding your invoice.   IF  you received labwork today, you will receive an invoice from [employer]. Please contact [facility] at [phone] with questions or concerns regarding your invoice.   Our billing staff will not be able to assist you with questions regarding bills from these companies.  You will be contacted with the lab results as soon as they are available. The fastest way to get your results is  to activate your My Chart account. Instructions are located on the last page of this paperwork. If you have not heard from [place] regarding the results in 2 weeks, please contact this office.

## 2015-12-12 ENCOUNTER — Ambulatory Visit (INDEPENDENT_AMBULATORY_CARE_PROVIDER_SITE_OTHER): Payer: Commercial Managed Care - HMO | Admitting: Family Medicine

## 2015-12-12 ENCOUNTER — Ambulatory Visit (INDEPENDENT_AMBULATORY_CARE_PROVIDER_SITE_OTHER): Payer: Commercial Managed Care - HMO

## 2015-12-12 ENCOUNTER — Observation Stay (HOSPITAL_COMMUNITY): Payer: Commercial Managed Care - HMO

## 2015-12-12 ENCOUNTER — Encounter (HOSPITAL_COMMUNITY): Payer: Self-pay | Admitting: *Deleted

## 2015-12-12 ENCOUNTER — Inpatient Hospital Stay (HOSPITAL_COMMUNITY)
Admission: EM | Admit: 2015-12-12 | Discharge: 2015-12-14 | DRG: 200 | Disposition: A | Discharge status: Home / Self Care | Payer: Commercial Managed Care - HMO | Attending: General Surgery | Admitting: General Surgery

## 2015-12-12 VITALS — BP 158/88 | HR 98 | Temp 98.0°F | Resp 17 | Ht 69.5 in | Wt 220.0 lb

## 2015-12-12 DIAGNOSIS — Z955 Presence of coronary angioplasty implant and graft: Secondary | ICD-10-CM | POA: Diagnosis not present

## 2015-12-12 DIAGNOSIS — S271XXA Traumatic hemothorax, initial encounter: Secondary | ICD-10-CM | POA: Diagnosis present

## 2015-12-12 DIAGNOSIS — E119 Type 2 diabetes mellitus without complications: Secondary | ICD-10-CM | POA: Diagnosis present

## 2015-12-12 DIAGNOSIS — S272XXA Traumatic hemopneumothorax, initial encounter: Secondary | ICD-10-CM

## 2015-12-12 DIAGNOSIS — R079 Chest pain, unspecified: Secondary | ICD-10-CM | POA: Diagnosis present

## 2015-12-12 DIAGNOSIS — Z800 Family history of malignant neoplasm of digestive organs: Secondary | ICD-10-CM | POA: Diagnosis not present

## 2015-12-12 DIAGNOSIS — D72829 Elevated white blood cell count, unspecified: Secondary | ICD-10-CM

## 2015-12-12 DIAGNOSIS — S2242XA Multiple fractures of ribs, left side, initial encounter for closed fracture: Secondary | ICD-10-CM | POA: Diagnosis present

## 2015-12-12 DIAGNOSIS — I2510 Atherosclerotic heart disease of native coronary artery without angina pectoris: Secondary | ICD-10-CM | POA: Diagnosis present

## 2015-12-12 DIAGNOSIS — S299XXA Unspecified injury of thorax, initial encounter: Secondary | ICD-10-CM | POA: Diagnosis not present

## 2015-12-12 DIAGNOSIS — F17210 Nicotine dependence, cigarettes, uncomplicated: Secondary | ICD-10-CM | POA: Diagnosis present

## 2015-12-12 DIAGNOSIS — R110 Nausea: Secondary | ICD-10-CM

## 2015-12-12 DIAGNOSIS — K567 Ileus, unspecified: Secondary | ICD-10-CM | POA: Diagnosis present

## 2015-12-12 DIAGNOSIS — S2232XD Fracture of one rib, left side, subsequent encounter for fracture with routine healing: Secondary | ICD-10-CM

## 2015-12-12 DIAGNOSIS — W010XXA Fall on same level from slipping, tripping and stumbling without subsequent striking against object, initial encounter: Secondary | ICD-10-CM | POA: Diagnosis present

## 2015-12-12 DIAGNOSIS — R0789 Other chest pain: Secondary | ICD-10-CM

## 2015-12-12 DIAGNOSIS — D62 Acute posthemorrhagic anemia: Secondary | ICD-10-CM | POA: Diagnosis present

## 2015-12-12 DIAGNOSIS — I10 Essential (primary) hypertension: Secondary | ICD-10-CM | POA: Diagnosis present

## 2015-12-12 DIAGNOSIS — R7989 Other specified abnormal findings of blood chemistry: Secondary | ICD-10-CM | POA: Diagnosis present

## 2015-12-12 DIAGNOSIS — E871 Hypo-osmolality and hyponatremia: Secondary | ICD-10-CM | POA: Diagnosis not present

## 2015-12-12 DIAGNOSIS — R109 Unspecified abdominal pain: Secondary | ICD-10-CM | POA: Diagnosis not present

## 2015-12-12 DIAGNOSIS — R1030 Lower abdominal pain, unspecified: Secondary | ICD-10-CM

## 2015-12-12 DIAGNOSIS — J90 Pleural effusion, not elsewhere classified: Secondary | ICD-10-CM | POA: Diagnosis not present

## 2015-12-12 DIAGNOSIS — Z806 Family history of leukemia: Secondary | ICD-10-CM | POA: Diagnosis not present

## 2015-12-12 DIAGNOSIS — S2249XA Multiple fractures of ribs, unspecified side, initial encounter for closed fracture: Secondary | ICD-10-CM | POA: Diagnosis not present

## 2015-12-12 DIAGNOSIS — Z833 Family history of diabetes mellitus: Secondary | ICD-10-CM | POA: Diagnosis not present

## 2015-12-12 DIAGNOSIS — E785 Hyperlipidemia, unspecified: Secondary | ICD-10-CM | POA: Diagnosis present

## 2015-12-12 DIAGNOSIS — W19XXXA Unspecified fall, initial encounter: Secondary | ICD-10-CM | POA: Diagnosis present

## 2015-12-12 DIAGNOSIS — D649 Anemia, unspecified: Secondary | ICD-10-CM | POA: Diagnosis present

## 2015-12-12 DIAGNOSIS — Z8249 Family history of ischemic heart disease and other diseases of the circulatory system: Secondary | ICD-10-CM | POA: Diagnosis not present

## 2015-12-12 HISTORY — DX: Multiple fractures of ribs, unspecified side, initial encounter for closed fracture: S22.49XA

## 2015-12-12 LAB — POC MICROSCOPIC URINALYSIS (UMFC): Mucus: ABSENT

## 2015-12-12 LAB — COMPREHENSIVE METABOLIC PANEL
ALT: 13 U/L — ABNORMAL LOW (ref 17–63)
AST: 18 U/L (ref 15–41)
Albumin: 3.4 g/dL — ABNORMAL LOW (ref 3.5–5.0)
Alkaline Phosphatase: 37 U/L — ABNORMAL LOW (ref 38–126)
Anion gap: 10 (ref 5–15)
BUN: 28 mg/dL — ABNORMAL HIGH (ref 6–20)
CO2: 23 mmol/L (ref 22–32)
Calcium: 9.2 mg/dL (ref 8.9–10.3)
Chloride: 101 mmol/L (ref 101–111)
Creatinine, Ser: 1.85 mg/dL — ABNORMAL HIGH (ref 0.61–1.24)
GFR calc Af Amer: 38 mL/min — ABNORMAL LOW (ref >60)
GFR calc non Af Amer: 33 mL/min — ABNORMAL LOW (ref >60)
Glucose, Bld: 184 mg/dL — ABNORMAL HIGH (ref 65–99)
Potassium: 3.8 mmol/L (ref 3.5–5.1)
Sodium: 134 mmol/L — ABNORMAL LOW (ref 135–145)
Total Bilirubin: 0.6 mg/dL (ref 0.3–1.2)
Total Protein: 6.6 g/dL (ref 6.5–8.1)

## 2015-12-12 LAB — CBC WITH DIFFERENTIAL/PLATELET
Basophils Absolute: 0 10*3/uL (ref 0.0–0.1)
Basophils Relative: 0 %
Eosinophils Absolute: 0 10*3/uL (ref 0.0–0.7)
Eosinophils Relative: 0 %
HCT: 34.6 % — ABNORMAL LOW (ref 39.0–52.0)
Hemoglobin: 10.8 g/dL — ABNORMAL LOW (ref 13.0–17.0)
Lymphocytes Relative: 11 %
Lymphs Abs: 1.1 10*3/uL (ref 0.7–4.0)
MCH: 25.1 pg — ABNORMAL LOW (ref 26.0–34.0)
MCHC: 31.2 g/dL (ref 30.0–36.0)
MCV: 80.3 fL (ref 78.0–100.0)
Monocytes Absolute: 0.8 10*3/uL (ref 0.1–1.0)
Monocytes Relative: 9 %
Neutro Abs: 7.7 10*3/uL (ref 1.7–7.7)
Neutrophils Relative %: 80 %
Platelets: 213 10*3/uL (ref 150–400)
RBC: 4.31 MIL/uL (ref 4.22–5.81)
RDW: 15.8 % — ABNORMAL HIGH (ref 11.5–15.5)
WBC: 9.6 10*3/uL (ref 4.0–10.5)

## 2015-12-12 LAB — POCT URINALYSIS DIP (MANUAL ENTRY)
Blood, UA: NEGATIVE
Glucose, UA: NEGATIVE
Ketones, POC UA: NEGATIVE
Leukocytes, UA: NEGATIVE
Nitrite, UA: NEGATIVE
Protein Ur, POC: >=300 — AB
Spec Grav, UA: >=1.03
Urobilinogen, UA: 0.2
pH, UA: 5

## 2015-12-12 LAB — POCT CBC
Granulocyte percent: 78.6 %G (ref 37–80)
HCT, POC: 35.9 % — AB (ref 43.5–53.7)
Hemoglobin: 12.1 g/dL — AB (ref 14.1–18.1)
Lymph, poc: 1.9 (ref 0.6–3.4)
MCH, POC: 26.4 pg — AB (ref 27–31.2)
MCHC: 33.7 g/dL (ref 31.8–35.4)
MCV: 78.3 fL — AB (ref 80–97)
MID (cbc): 0.7 (ref 0–0.9)
MPV: 9.1 fL (ref 0–99.8)
POC Granulocyte: 9.7 — AB (ref 2–6.9)
POC LYMPH PERCENT: 15.8 %L (ref 10–50)
POC MID %: 5.6 %M (ref 0–12)
Platelet Count, POC: 242 10*3/uL (ref 142–424)
RBC: 4.59 M/uL — AB (ref 4.69–6.13)
RDW, POC: 17.2 %
WBC: 12.3 10*3/uL — AB (ref 4.6–10.2)

## 2015-12-12 LAB — GLUCOSE, CAPILLARY: Glucose-Capillary: 147 mg/dL — ABNORMAL HIGH (ref 65–99)

## 2015-12-12 LAB — I-STAT TROPONIN, ED: Troponin i, poc: 0.03 ng/mL (ref 0.00–0.08)

## 2015-12-12 LAB — LIPASE, BLOOD: Lipase: 25 U/L (ref 11–51)

## 2015-12-12 MED ORDER — MORPHINE SULFATE (PF) 2 MG/ML IV SOLN
2.0000 mg | INTRAVENOUS | Status: DC | PRN
  Administered 2015-12-13 – 2015-12-14 (×3): 2 mg via INTRAVENOUS
  Filled 2015-12-12 (×3): qty 1

## 2015-12-12 MED ORDER — LISINOPRIL-HYDROCHLOROTHIAZIDE 20-12.5 MG PO TABS: 1.0000 | ORAL_TABLET | Freq: Every day | ORAL | Status: DC

## 2015-12-12 MED ORDER — INSULIN ASPART PROT & ASPART (70-30 MIX) 100 UNIT/ML ~~LOC~~ SUSP
7.0000 [IU] | Freq: Two times a day (BID) | SUBCUTANEOUS | Status: DC
  Administered 2015-12-13 – 2015-12-14 (×3): 7 [IU] via SUBCUTANEOUS
  Filled 2015-12-12: qty 10

## 2015-12-12 MED ORDER — DOCUSATE SODIUM 100 MG PO CAPS
100.0000 mg | ORAL_CAPSULE | Freq: Two times a day (BID) | ORAL | Status: DC
  Administered 2015-12-12 – 2015-12-14 (×3): 100 mg via ORAL
  Filled 2015-12-12 (×4): qty 1

## 2015-12-12 MED ORDER — HYDROCHLOROTHIAZIDE 12.5 MG PO CAPS
12.5000 mg | ORAL_CAPSULE | Freq: Every day | ORAL | Status: DC
  Administered 2015-12-13 – 2015-12-14 (×2): 12.5 mg via ORAL
  Filled 2015-12-12 (×2): qty 1

## 2015-12-12 MED ORDER — ONDANSETRON HCL 4 MG/2ML IJ SOLN: 4.0000 mg | Freq: Four times a day (QID) | INTRAMUSCULAR | Status: DC | PRN

## 2015-12-12 MED ORDER — METFORMIN HCL 500 MG PO TABS
1000.0000 mg | ORAL_TABLET | Freq: Two times a day (BID) | ORAL | Status: DC
  Administered 2015-12-13 – 2015-12-14 (×3): 1000 mg via ORAL
  Filled 2015-12-12 (×3): qty 2

## 2015-12-12 MED ORDER — NAPROXEN 250 MG PO TABS
500.0000 mg | ORAL_TABLET | Freq: Two times a day (BID) | ORAL | Status: DC
  Administered 2015-12-13 – 2015-12-14 (×3): 500 mg via ORAL
  Filled 2015-12-12 (×3): qty 2

## 2015-12-12 MED ORDER — ENOXAPARIN SODIUM 40 MG/0.4ML ~~LOC~~ SOLN
40.0000 mg | SUBCUTANEOUS | Status: DC
  Administered 2015-12-12 – 2015-12-13 (×2): 40 mg via SUBCUTANEOUS
  Filled 2015-12-12 (×2): qty 0.4

## 2015-12-12 MED ORDER — POTASSIUM CHLORIDE IN NACL 20-0.45 MEQ/L-% IV SOLN
INTRAVENOUS | Status: DC
  Administered 2015-12-12: 22:00:00 via INTRAVENOUS
  Administered 2015-12-13: 1 mL via INTRAVENOUS
  Administered 2015-12-13: 06:00:00 via INTRAVENOUS
  Administered 2015-12-14: 1 mL via INTRAVENOUS
  Filled 2015-12-12 (×7): qty 1000

## 2015-12-12 MED ORDER — ONDANSETRON HCL 4 MG/2ML IJ SOLN
4.0000 mg | Freq: Once | INTRAMUSCULAR | Status: AC
  Administered 2015-12-12: 4 mg via INTRAVENOUS
  Filled 2015-12-12: qty 2

## 2015-12-12 MED ORDER — METOPROLOL TARTRATE 50 MG PO TABS
50.0000 mg | ORAL_TABLET | Freq: Two times a day (BID) | ORAL | Status: DC
  Administered 2015-12-12 – 2015-12-14 (×4): 50 mg via ORAL
  Filled 2015-12-12 (×4): qty 1

## 2015-12-12 MED ORDER — LISINOPRIL 20 MG PO TABS
20.0000 mg | ORAL_TABLET | Freq: Every day | ORAL | Status: DC
  Administered 2015-12-13 – 2015-12-14 (×2): 20 mg via ORAL
  Filled 2015-12-12 (×2): qty 1

## 2015-12-12 MED ORDER — LISINOPRIL 20 MG PO TABS: 20.0000 mg | ORAL_TABLET | Freq: Every day | ORAL | Status: DC

## 2015-12-12 MED ORDER — INSULIN ASPART 100 UNIT/ML ~~LOC~~ SOLN
0.0000 [IU] | Freq: Three times a day (TID) | SUBCUTANEOUS | Status: DC
  Administered 2015-12-13: 2 [IU] via SUBCUTANEOUS
  Administered 2015-12-13: 3 [IU] via SUBCUTANEOUS
  Administered 2015-12-13: 2 [IU] via SUBCUTANEOUS

## 2015-12-12 MED ORDER — TRAMADOL HCL 50 MG PO TABS
50.0000 mg | ORAL_TABLET | Freq: Four times a day (QID) | ORAL | Status: DC | PRN
  Administered 2015-12-12 – 2015-12-14 (×2): 100 mg via ORAL
  Filled 2015-12-12 (×2): qty 2

## 2015-12-12 MED ORDER — ATORVASTATIN CALCIUM 10 MG PO TABS
10.0000 mg | ORAL_TABLET | Freq: Every day | ORAL | Status: DC
  Administered 2015-12-12 – 2015-12-13 (×2): 10 mg via ORAL
  Filled 2015-12-12 (×2): qty 1

## 2015-12-12 MED ORDER — ONDANSETRON HCL 4 MG PO TABS: 4.0000 mg | ORAL_TABLET | Freq: Four times a day (QID) | ORAL | Status: DC | PRN

## 2015-12-12 MED ORDER — POLYETHYLENE GLYCOL 3350 17 G PO PACK
17.0000 g | PACK | Freq: Every day | ORAL | Status: DC
  Administered 2015-12-12 – 2015-12-14 (×3): 17 g via ORAL
  Filled 2015-12-12 (×3): qty 1

## 2015-12-12 MED ORDER — HYDROMORPHONE HCL 1 MG/ML IJ SOLN
1.0000 mg | Freq: Once | INTRAMUSCULAR | Status: AC
  Administered 2015-12-12: 1 mg via INTRAVENOUS
  Filled 2015-12-12: qty 1

## 2015-12-12 NOTE — ED Triage Notes (Addendum)
Pt states he fell Saturday breaking three ribs on left side. Pt has increased pain, shortness. Pt had Xray done at doctors office showing fluid in lungs. Pt also states he feels nauseous every time he tries to eat. Last BM yesterday. Last dose of hydrocodone 1300.

## 2015-12-12 NOTE — Progress Notes (Signed)
By signing my name below I, Tereasa Coop, attest that this documentation has been prepared under the direction and in the presence of [name], MD. Electonically Signed. Tereasa Coop, Scribe 12/12/2015 at 2:28 PM       Patient ID: [name], male    DOB: [birth year], 82 y.o.   MRN: [id number]  Chief Complaint  Patient presents with   Follow-up    rib fractures     HPI   [name] is a 82 y.o. male who presents to the [facility] for reevaluation of left side pain after fall. Pt was initially seen and evaluated on 12/09/15. At that time pt reports slipping while walking down his steps outside his home and landing on his left ribs. Pt was referred for a CT which showed pt had left 9th, 10th, and 11th posterior rib fractures. Pt was given 20 norco/vicodin 5-325mg  tablets to take 1 every 6 hrs as needed. Pt has been taking 2 pills usually BID, occasionally TID. When pt takes 2 pills, pt can breath without pain. Pt has not taken any today.   Pt reports feeling nauseous, "flushed" and constipated. Pt vomited once yesterday. Pt denies SOB. Pt had a small BM today and a small BM yesterday. Pt reports that he has been intentionally taking deep breaths at home as instructed. Pt also reports pain in lower abd and mild cough with phlegm.   Pt has history of DM.   Patient Active Problem List   Diagnosis Date Noted   Blood in the urine 12/01/2013   Cystitis, radiation 12/01/2013   Malignant neoplasm of prostate ([place]) 11/26/2013   Pulsatile tinnitus 11/19/2013   Rectal bleeding 12/23/2012   Type II or unspecified type diabetes mellitus without mention of complication, not stated as uncontrolled    Essential hypertension, benign    Chronic back pain    ED (erectile dysfunction)    Anemia    Hyperlipidemia    CAD, NATIVE VESSEL 05/08/2008   VENTRICULAR TACHYCARDIA 05/08/2008   PREMATURE VENTRICULAR CONTRACTIONS 05/08/2008   Past Medical  History:  Diagnosis Date   Allergy    CAD (coronary artery disease)    pci to LAD 10/09; stable CAD by cath 05/31/08; myoview 04/11/11- no ishcemia, EF 67%; echo 05/15/09- EF>55%, mod calcification of the aortic valve leaflets   Chronic back pain    ED (erectile dysfunction)    Essential hypertension, benign    Hyperlipidemia    Prostate cancer ([place])    s/p   Type II or unspecified type diabetes mellitus without mention of complication, not stated as uncontrolled    Ventricular tachycardia ([place])    resuscitated; monitor 05/2008; attempted T ablation 2/10- aborted due to inappropriate substrate   Past Surgical History:  Procedure Laterality Date   CARDIAC CATHETERIZATION  05/31/08   EF 55-60%, stable lesions, medical therapy   COLECTOMY  '80's   polyps   CORONARY STENT PLACEMENT  01/19/08   PCI stent to mid LAD with Promus DES 27.5x28   PTCA  01/2008   No Known Allergies Prior to Admission medications   Medication Sig Start Date End Date Taking? Authorizing Provider  atorvastatin (LIPITOR) 10 MG tablet Take 1 tablet (10 mg total) by mouth daily. 11/16/15  Yes [name], MD  glucose blood (TRUETEST TEST) test strip USE AS DIRECTED THREE TIMES DAILY 06/21/13  Yes [name], MD  HYDROcodone-acetaminophen (NORCO/VICODIN) 5-325 MG tablet Take 1-2 tablets by mouth every  6 (six) hours as needed for moderate pain. 12/09/15  Yes [name], MD  insulin NPH-regular Human (NOVOLIN 70/30) (70-30) 100 UNIT/ML injection Inject 7 Units into the skin 2 (two) times daily with a meal. 11/16/15  Yes [name], MD  Insulin Syringe-Needle U-100 (B-D INS SYR HALF-UNIT .3CC/31G) 31G X 5/16" 0.3 ML MISC Use daily at bedtime to inject insulin. Dx code: 250.00 06/21/13  Yes [name], MD  lisinopril (PRINIVIL,ZESTRIL) 20 MG tablet Take 1 tablet (20 mg total) by mouth daily. 11/16/15  Yes [name], MD  lisinopril-hydrochlorothiazide (PRINZIDE,ZESTORETIC) 20-12.5 MG  tablet Take 1 tablet by mouth daily. 11/16/15  Yes [name], MD  metFORMIN (GLUCOPHAGE) 1000 MG tablet Take 1 tablet (1,000 mg total) by mouth 2 (two) times daily with a meal. 11/16/15  Yes [name], MD  metoprolol (LOPRESSOR) 50 MG tablet Take 1 tablet (50 mg total) by mouth 2 (two) times daily. 11/16/15  Yes [name], MD  Multiple Vitamin (MULTIVITAMIN) tablet Take 1 tablet by mouth daily.   Yes Historical Provider, MD  Omega-3 Fatty Acids (FISH OIL) 1000 MG CAPS Take by mouth.   Yes Historical Provider, MD  sildenafil (VIAGRA) 50 MG tablet Take 1 tablet (50 mg total) by mouth daily as needed for erectile dysfunction. 02/08/15  Yes Lorretta Harp, MD  Vitamin D, Cholecalciferol, 1000 UNITS TABS Take by mouth daily.   Yes Historical Provider, MD   Social History   Social History   Marital status: Legally Separated    Spouse name: [name]   Number of children: 7   Years of education: [name]   Occupational History   RETIRED Retired   Social History Main Topics   Smoking status: Current Some Day Smoker    Packs/day: 0.50    Years: 15.00    Types: Cigarettes    Last attempt to quit: 09/01/1990   Smokeless tobacco: Never Used     Comment: quit 12/07/2013   Alcohol use No   Drug use: No   Sexual activity: No   Other Topics Concern   Not on file   Social History Narrative   29 grandchildren. Single. Education: 12 th grade. Exercise: 3-4 times a week for 30 minutes working out and set ups.      Review of Systems  Constitutional: Negative for fever (Pt is feeling "flushed").  Respiratory: Positive for cough. Negative for shortness of breath.   Gastrointestinal: Positive for abdominal pain (lower abd), constipation and nausea.       Objective:   Physical Exam  Constitutional: He is oriented to person, place, and time. He appears well-developed and well-nourished. No distress.  HENT:  Head: Normocephalic and atraumatic.  Eyes: Conjunctivae are normal.  Pupils are equal, round, and reactive to light.  Neck: Neck supple.  Cardiovascular: Normal rate, regular rhythm and normal heart sounds.  Exam reveals no gallop and no friction rub.   No murmur heard. Pulmonary/Chest: Effort normal.  Good air exchange, pain with deep breath. Few coarse breath sound LLF.   Abdominal: There is tenderness (mild) in the suprapubic area.  Musculoskeletal: Normal range of motion.  Neurological: He is alert and oriented to person, place, and time.  Skin: Skin is warm and dry.  Psychiatric: He has a normal mood and affect. His behavior is normal.  Nursing note and vitals reviewed.    Vitals:   12/12/15 1110  BP: (!) 158/88  Pulse: 98  Resp: 17  Temp:  8 F (36.7 C)  TempSrc: Oral  SpO2: 95%  Weight: 220 lb (99.8 kg)  Height: 5' 9.5" (1.765 m)   Results for orders placed or performed in visit on 12/12/15  POCT urinalysis dipstick  Result Value Ref Range   Color, UA yellow yellow   Clarity, UA clear clear   Glucose, UA negative negative   Bilirubin, UA small (A) negative   Ketones, POC UA negative negative   Spec Grav, UA >=1.030    Blood, UA negative negative   pH, UA 5.0    Protein Ur, POC >=300 (A) negative   Urobilinogen, UA 0.2    Nitrite, UA Negative Negative   Leukocytes, UA Negative Negative  POCT Microscopic Urinalysis ([facility])  Result Value Ref Range   WBC,UR,HPF,POC None None WBC/hpf   RBC,UR,HPF,POC None None RBC/hpf   Bacteria None None, Too numerous to count   Mucus Absent Absent   Epithelial Cells, UR Per Microscopy Few (A) None, Too numerous to count cells/hpf  POCT CBC  Result Value Ref Range   WBC 12.3 (A) 4.6 - 10.2 K/uL   Lymph, poc 1.9 0.6 - 3.4   POC LYMPH PERCENT 15.8 10 - 50 %L   MID (cbc) 0.7 0 - 0.9   POC MID % 5.6 0 - 12 %M   POC Granulocyte 9.7 (A) 2 - 6.9   Granulocyte percent 78.6 37 - 80 %G   RBC 4.59 (A) 4.69 - 6.13 M/uL   Hemoglobin 12.1 (A) 14.1 - 18.1 g/dL   HCT, POC 35.9 (A) 43.5 - 53.7 %   MCV  78.3 (A) 80 - 97 fL   MCH, POC 26.4 (A) 27 - 31.2 pg   MCHC 33.7 31.8 - 35.4 g/dL   RDW, POC 17.2 %   Platelet Count, POC 242 142 - 424 K/uL   MPV 9.1 0 - 99.8 fL    Dg Chest 1 View  Result Date: 12/12/2015 CLINICAL DATA:  Left-sided rib fractures. EXAM: CHEST 1 VIEW COMPARISON:  Chest abdomen study performed at same time. FINDINGS: Lateral view of the chest shows small to moderate left pleural effusion with left base collapse/ consolidation better seen on the frontal projection included as part of a separate exam. No evidence for thoracic spine fracture. IMPRESSION: Small to moderate left pleural effusion. Please see chest abdomen study reported separately. Electronically Signed   By: Misty Stanley M.D.   On: 12/12/2015 14:04   Dg Ribs Unilateral W/chest Left  Result Date: 12/09/2015 CLINICAL DATA:  [place] down stairs this morning. Left lateral and posterior rib pain. EXAM: LEFT RIBS AND CHEST - 3+ VIEW COMPARISON:  Chest x-ray 03/21/2010 FINDINGS: The cardiac silhouette, mediastinal and hilar contours are normal and stable. There is mild tortuosity and calcification of the thoracic aorta. The lungs are clear. Minimal streaky bibasilar atelectasis. No pleural effusion or pneumothorax. Dedicated views of the left ribs do not demonstrate any definite acute rib fractures. IMPRESSION: No acute cardiopulmonary findings. Low lung volumes with minimal streaky bibasilar atelectasis. No definite acute rib fractures. Electronically Signed   By: Marijo Sanes M.D.   On: 12/09/2015 11:23   Ct Abdomen Pelvis W Contrast  Result Date: 12/09/2015 CLINICAL DATA:  Abdominal contusion. Left upper quadrant pain. Fell down stairs today. EXAM: CT ABDOMEN AND PELVIS WITH CONTRAST TECHNIQUE: Multidetector CT imaging of the abdomen and pelvis was performed using the standard protocol following bolus administration of intravenous contrast. CONTRAST:  75mL ISOVUE-300 IOPAMIDOL (ISOVUE-300) INJECTION 61% COMPARISON:  None.  FINDINGS: Lower chest: The lung bases are clear of acute process. There is dependent atelectasis and edema. No pulmonary contusions, pleural effusion or pneumothorax. The heart is mildly enlarged. No pericardial effusion. Three-vessel coronary artery calcifications are noted. Moderate aortic calcifications. Hepatobiliary: No focal hepatic lesions or intrahepatic biliary dilatation. No acute injury is identified. Gallbladder surgically absent. No common bile duct dilatation. Pancreas: No mass, inflammation, duct dilatation or acute injury. Spleen: Normal size. No focal lesions. No acute injury is identified despite the adjacent displaced rib fractures. Adrenals/Urinary Tract: The adrenal glands and kidneys are unremarkable and stable. No acute injury. No renal lesion or hydronephrosis. Stomach/Bowel: The stomach, duodenum, small bowel and colon are grossly normal without oral contrast. No inflammatory changes, mass lesions or obstructive findings. There are surgical changes involving the left colon. The terminal ileum and appendix are normal. Vascular/Lymphatic: Moderate atherosclerotic calcifications involving the aorta and branch vessels. No dissection or focal aneurysm. The major venous structures are patent. No mesenteric or retroperitoneal mass or adenopathy. Other: No pelvic mass or hematoma. Mild bladder distention. Moderate prostate gland enlargement. There is median lobe hypertrophy. Surgical clips noted in the deep pelvis. No inguinal mass or adenopathy. No abdominal wall hernia or subcutaneous lesions. Surgical changes noted in the left abdomen under the anterior abdominal wall. Musculoskeletal: There are left lower rib fractures noted. The ninth, tenth and eleventh left posterior lateral ribs are fractured. Mild displacement of the eleventh rib fracture. The tenth rib is fractured in 2 places but no displacement. The ninth rib is fractured and displaced. IMPRESSION: 1. No acute abdominal/pelvic findings,  mass lesions or adenopathy. No acute injury is identified. 2. Left ninth, tenth and eleventh posterior lateral rib fractures are noted and likely account for the patient's left flank pain. 3. Stable atherosclerotic calcifications involving the aorta and branch vessels and three-vessel coronary artery calcifications. 4. Stable mild prostate gland enlargement with median lobe hypertrophy and mild distention of the bladder. Electronically Signed   By: Marijo Sanes M.D.   On: 12/09/2015 13:30   Dg Abd Acute W/chest  Result Date: 12/12/2015 CLINICAL DATA:  Lower abdominal pain and nausea, follow-up of multiple left-sided rib fractures. EXAM: DG ABDOMEN ACUTE W/ 1V CHEST COMPARISON:  CT scan of the abdomen and pelvis of December 09, 2015. FINDINGS: There is volume loss on the right consistent with a moderate-sized pleural effusion. There is no pneumothorax. The right lung is adequately inflated and clear. The heart is not clearly enlarged. The pulmonary vascularity is normal. There is calcification in the wall of the aortic arch. There is multilevel degenerative disc disease of the thoracic spine. Fractures of the posterior lateral aspects of the ninth, tenth, and eleventh ribs are visible on the abdominal images. Within the abdomen there are loops of moderately distended gas-filled bowel. There is a moderate amount of stool in the right colon. No free extraluminal gas collections are observed. There are numerous surgical clips in the left upper and mid abdomen and in the right upper quadrant. There are surgical clips projecting over the prostate bed. There degenerative changes of the lumbar spine. IMPRESSION: 1. New left pleural effusion likely related to known ninth through eleventh posterior left rib fractures. No pneumothorax. Mild infrahilar atelectasis on the right. 2. Aortic atherosclerosis. 3. Loops of distended bowel, likely small bowel, in the mid and upper abdomen with small air-fluid levels. This likely  reflects an ileus. There is no definite rectal gas. No free air is observed. 4. Degenerative changes of the lumbar  spine. Electronically Signed   By: David  Martinique M.D.   On: 12/12/2015 14:01         Assessment & Plan:   EMMANUEL BUSLER is a 82 y.o. male Rib fractures, left, with routine healing, subsequent encounter - Plan: DG Abd Acute W/Chest, DG Chest 1 View  Abdominal pain, suprapubic, unspecified laterality - Plan: POCT urinalysis dipstick, POCT Microscopic Urinalysis ([facility]), POCT CBC, DG Abd Acute W/Chest  Nausea without vomiting  Pleural effusion  Leukocytosis  Ileus ([place])  82 year old male with history of CAD, insulin-dependent diabetes, hypertension, hyperlipidemia, remote history of prostate cancer status post fall at home 3 days ago. Fell onto stairs with left lateral chest/flank. Initial x-rays in office did not indicate obvious rib fracture, subsequently sent for CT abdomen to rule out splenic or renal injury. 3 posterior rib fractures were noted, but no pneumothorax or initial pleural effusion.   Here for follow-up today with persistent left sided pain moderately improved with hydrocodone to every 6 hours.However now has slight productive cough, nausea without episode of emesis yesterday, subjectively warm sensation without objective fever, and lower abdominal pain. Mild to moderate sized left pleural effusion noted on abdominal series along with ileus likely. Leukocytosis also noted. Infectious versus inflammatory.  -Further eval through [facility] emergency room, and trauma service eval. Triage nurse advised.  Patient transported by private vehicle by wife.  No orders of the defined types were placed in this encounter.  Patient Instructions   After leaving our office, proceed to [facility] emergency room. They're expecting you. If follow-up needed after your hospital stay, return here as soon as needed. Good luck, and I will be following your hospital evaluation through  your chart.    IF you received an x-ray today, you will receive an invoice from [facility] Radiology. Please contact [facility] at [phone] with questions or concerns regarding your invoice.   IF you received labwork today, you will receive an invoice from [employer]. Please contact [facility] at [phone] with questions or concerns regarding your invoice.   Our billing staff will not be able to assist you with questions regarding bills from these companies.  You will be contacted with the lab results as soon as they are available. The fastest way to get your results is to activate your My Chart account. Instructions are located on the last page of this paperwork. If you have not heard from [place] regarding the results in 2 weeks, please contact this office.        I personally performed the services described in this documentation, which was scribed in my presence. The recorded information has been reviewed and considered, and addended by me as needed.   Signed,   [name], MD [facility].  12/12/15 2:35 PM

## 2015-12-12 NOTE — Patient Instructions (Addendum)
After leaving our office, proceed to [facility] emergency room. They're expecting you. If follow-up needed after your hospital stay, return here as soon as needed. Good luck, and I will be following your hospital evaluation through your chart.    IF you received an x-ray today, you will receive an invoice from [facility]. Please contact [facility] at [phone] with questions or concerns regarding your invoice.   IF you received labwork today, you will receive an invoice from [employer]. Please contact [facility] at [phone] with questions or concerns regarding your invoice.   Our billing staff will not be able to assist you with questions regarding bills from these companies.  You will be contacted with the lab results as soon as they are available. The fastest way to get your results is to activate your My Chart account. Instructions are located on the last page of this paperwork. If you have not heard from [place] regarding the results in 2 weeks, please contact this office.

## 2015-12-12 NOTE — ED Notes (Signed)
Attempted to call report x 1  

## 2015-12-12 NOTE — H&P (Signed)
[name] is an 82 y.o. male.   Chief Complaint: Fall HPI: [name] tripped and fell on Saturday morning. He had a brief loss of consciousness. He was evaluated at urgent care and diagnosed with left rib fxs x3. He was managing at home but began to have problems with nausea and constipation starting Sunday morning. He returned to urgent care today where x-rays showed a moderate pleural effusion that is likely a HTX and e/o an ileus. His PCP called the trauma service and we recommended admission. He c/o left chest pain, some mild SOB, and the nausea.  Past Medical History:  Diagnosis Date  . Allergy   . CAD (coronary artery disease)    pci to LAD 10/09; stable CAD by cath 05/31/08; myoview 04/11/11- no ishcemia, EF 67%; echo 05/15/09- EF>55%, mod calcification of the aortic valve leaflets  . Chronic back pain   . ED (erectile dysfunction)   . Essential hypertension, benign   . Hyperlipidemia   . Prostate cancer ([place])    s/p  . Type II or unspecified type diabetes mellitus without mention of complication, not stated as uncontrolled   . Ventricular tachycardia ([place])    resuscitated; monitor 05/2008; attempted T ablation 2/10- aborted due to inappropriate substrate    Past Surgical History:  Procedure Laterality Date  . CARDIAC CATHETERIZATION  05/31/08   EF 55-60%, stable lesions, medical therapy  . COLECTOMY  '80's   polyps  . CORONARY STENT PLACEMENT  01/19/08   PCI stent to mid LAD with Promus DES 27.5x28  . PTCA  01/2008    Family History  Problem Relation Age of Onset  . Leukemia Father   . Heart disease Sister   . Heart disease Brother   . Diabetes Brother     pancreatic cancer  . Diabetes Mother   . Heart attack Mother    Social History:  reports that he has been smoking Cigarettes.  He has a 7.50 pack-year smoking history. He has never used smokeless tobacco. He reports that he does not drink alcohol or use drugs.  Allergies: No Known Allergies  Results for orders  placed or performed during the hospital encounter of 12/12/15 (from the past 48 hour(s))  CBC with Differential     Status: Abnormal   Collection Time: 12/12/15  3:13 PM  Result Value Ref Range   WBC 9.6 4.0 - 10.5 K/uL   RBC 4.31 4.22 - 5.81 MIL/uL   Hemoglobin 10.8 (L) 13.0 - 17.0 g/dL   HCT 34.6 (L) 39.0 - 52.0 %   MCV 80.3 78.0 - 100.0 fL   MCH 25.1 (L) 26.0 - 34.0 pg   MCHC 31.2 30.0 - 36.0 g/dL   RDW 15.8 (H) 11.5 - 15.5 %   Platelets 213 150 - 400 K/uL   Neutrophils Relative % 80 %   Neutro Abs 7.7 1.7 - 7.7 K/uL   Lymphocytes Relative 11 %   Lymphs Abs 1.1 0.7 - 4.0 K/uL   Monocytes Relative 9 %   Monocytes Absolute 0.8 0.1 - 1.0 K/uL   Eosinophils Relative 0 %   Eosinophils Absolute 0.0 0.0 - 0.7 K/uL   Basophils Relative 0 %   Basophils Absolute 0.0 0.0 - 0.1 K/uL   Dg Chest 1 View  Result Date: 12/12/2015 CLINICAL DATA:  Left-sided rib fractures. EXAM: CHEST 1 VIEW COMPARISON:  Chest abdomen study performed at same time. FINDINGS: Lateral view of the chest shows small to moderate left pleural effusion with left base  collapse/ consolidation better seen on the frontal projection included as part of a separate exam. No evidence for thoracic spine fracture. IMPRESSION: Small to moderate left pleural effusion. Please see chest abdomen study reported separately. Electronically Signed   By: Misty Stanley M.D.   On: 12/12/2015 14:04   Dg Abd Acute W/chest  Result Date: 12/12/2015 CLINICAL DATA:  Lower abdominal pain and nausea, follow-up of multiple left-sided rib fractures. EXAM: DG ABDOMEN ACUTE W/ 1V CHEST COMPARISON:  CT scan of the abdomen and pelvis of December 09, 2015. FINDINGS: There is volume loss on the right consistent with a moderate-sized pleural effusion. There is no pneumothorax. The right lung is adequately inflated and clear. The heart is not clearly enlarged. The pulmonary vascularity is normal. There is calcification in the wall of the aortic arch. There is  multilevel degenerative disc disease of the thoracic spine. Fractures of the posterior lateral aspects of the ninth, tenth, and eleventh ribs are visible on the abdominal images. Within the abdomen there are loops of moderately distended gas-filled bowel. There is a moderate amount of stool in the right colon. No free extraluminal gas collections are observed. There are numerous surgical clips in the left upper and mid abdomen and in the right upper quadrant. There are surgical clips projecting over the prostate bed. There degenerative changes of the lumbar spine. IMPRESSION: 1. New left pleural effusion likely related to known ninth through eleventh posterior left rib fractures. No pneumothorax. Mild infrahilar atelectasis on the right. 2. Aortic atherosclerosis. 3. Loops of distended bowel, likely small bowel, in the mid and upper abdomen with small air-fluid levels. This likely reflects an ileus. There is no definite rectal gas. No free air is observed. 4. Degenerative changes of the lumbar spine. Electronically Signed   By: David  Martinique M.D.   On: 12/12/2015 14:01    Review of Systems  Constitutional: Negative for weight loss.  HENT: Negative for ear discharge, ear pain, hearing loss and tinnitus.   Eyes: Negative for blurred vision, double vision, photophobia and pain.  Respiratory: Negative for cough, sputum production and shortness of breath.   Cardiovascular: Positive for chest pain.  Gastrointestinal: Positive for constipation and nausea. Negative for abdominal pain and vomiting.  Genitourinary: Negative for dysuria, flank pain, frequency and urgency.  Musculoskeletal: Negative for back pain, falls, joint pain, myalgias and neck pain.  Neurological: Positive for loss of consciousness. Negative for dizziness, tingling, sensory change, focal weakness and headaches.  Endo/Heme/Allergies: Does not bruise/bleed easily.  Psychiatric/Behavioral: Negative for depression, memory loss and substance  abuse. The patient is not nervous/anxious.     Blood pressure 125/64, pulse 93, temperature 98.6 F (37 C), temperature source Oral, resp. rate 18, height 6' (1.829 m), weight 99.3 kg (219 lb), SpO2 99 %. Physical Exam  Vitals reviewed. Constitutional: He is oriented to person, place, and time. He appears well-developed and well-nourished. He is cooperative. No distress.  HENT:  Head: Normocephalic and atraumatic. Head is without raccoon's eyes, without Battle's sign, without abrasion, without contusion and without laceration.  Right Ear: Hearing, tympanic membrane and ear canal normal. No lacerations. No drainage or tenderness. No foreign bodies. Tympanic membrane is not perforated. No hemotympanum.  Left Ear: Hearing, tympanic membrane and ear canal normal. No lacerations. No drainage or tenderness. No foreign bodies. Tympanic membrane is not perforated. No hemotympanum.  Nose: No nose lacerations, sinus tenderness, nasal deformity or nasal septal hematoma. No epistaxis.  Mouth/Throat: Uvula is midline and mucous membranes are normal.  No lacerations.  Eyes: Conjunctivae and lids are normal. Right eye exhibits no discharge. Left eye exhibits no discharge. No scleral icterus.  Neck: Trachea normal and normal range of motion. Neck supple. No JVD present. No spinous process tenderness and no muscular tenderness present. Carotid bruit is not present. No tracheal deviation present. No thyromegaly present.  Cardiovascular: Normal rate, regular rhythm, normal heart sounds, intact distal pulses and normal pulses.  Exam reveals no gallop and no friction rub.   No murmur heard. Respiratory: Effort normal and breath sounds normal. No stridor. No respiratory distress. He has no wheezes. He has no rales. He exhibits tenderness. He exhibits no bony tenderness, no laceration and no crepitus.  GI: Soft. Normal appearance. He exhibits distension. Bowel sounds are decreased. There is no tenderness. There is no  rigidity, no rebound, no guarding and no CVA tenderness.  Musculoskeletal: Normal range of motion. He exhibits no edema or tenderness.  Lymphadenopathy:    He has no cervical adenopathy.  Neurological: He is alert and oriented to person, place, and time. He has normal strength. No cranial nerve deficit or sensory deficit. GCS eye subscore is 4. GCS verbal subscore is 5. GCS motor subscore is 6.  Skin: Skin is warm, dry and intact. He is not diaphoretic.  Psychiatric: He has a normal mood and affect. His speech is normal and behavior is normal.     Assessment/Plan Fall Multiple left rib fxs -- Pulmonary toilet Ileus -- IVF, NGT if necessary DM/HTN -- Home meds  Admit to trauma service.    Lisette Abu, PA-C Pager: [phone] General Trauma PA Pager: [phone] 12/12/2015, 3:51 PM

## 2015-12-13 ENCOUNTER — Observation Stay (HOSPITAL_COMMUNITY): Payer: Commercial Managed Care - HMO

## 2015-12-13 DIAGNOSIS — S2249XA Multiple fractures of ribs, unspecified side, initial encounter for closed fracture: Secondary | ICD-10-CM | POA: Diagnosis not present

## 2015-12-13 DIAGNOSIS — D62 Acute posthemorrhagic anemia: Secondary | ICD-10-CM | POA: Diagnosis present

## 2015-12-13 DIAGNOSIS — Z955 Presence of coronary angioplasty implant and graft: Secondary | ICD-10-CM | POA: Diagnosis not present

## 2015-12-13 DIAGNOSIS — F17210 Nicotine dependence, cigarettes, uncomplicated: Secondary | ICD-10-CM | POA: Diagnosis present

## 2015-12-13 DIAGNOSIS — W010XXA Fall on same level from slipping, tripping and stumbling without subsequent striking against object, initial encounter: Secondary | ICD-10-CM | POA: Diagnosis present

## 2015-12-13 DIAGNOSIS — Z8249 Family history of ischemic heart disease and other diseases of the circulatory system: Secondary | ICD-10-CM | POA: Diagnosis not present

## 2015-12-13 DIAGNOSIS — K567 Ileus, unspecified: Secondary | ICD-10-CM | POA: Diagnosis present

## 2015-12-13 DIAGNOSIS — S271XXA Traumatic hemothorax, initial encounter: Secondary | ICD-10-CM | POA: Diagnosis present

## 2015-12-13 DIAGNOSIS — E119 Type 2 diabetes mellitus without complications: Secondary | ICD-10-CM | POA: Diagnosis present

## 2015-12-13 DIAGNOSIS — E871 Hypo-osmolality and hyponatremia: Secondary | ICD-10-CM | POA: Diagnosis not present

## 2015-12-13 DIAGNOSIS — Z806 Family history of leukemia: Secondary | ICD-10-CM | POA: Diagnosis not present

## 2015-12-13 DIAGNOSIS — S2242XA Multiple fractures of ribs, left side, initial encounter for closed fracture: Secondary | ICD-10-CM | POA: Diagnosis present

## 2015-12-13 DIAGNOSIS — Z833 Family history of diabetes mellitus: Secondary | ICD-10-CM | POA: Diagnosis not present

## 2015-12-13 DIAGNOSIS — Z800 Family history of malignant neoplasm of digestive organs: Secondary | ICD-10-CM | POA: Diagnosis not present

## 2015-12-13 DIAGNOSIS — R079 Chest pain, unspecified: Secondary | ICD-10-CM | POA: Diagnosis present

## 2015-12-13 DIAGNOSIS — J90 Pleural effusion, not elsewhere classified: Secondary | ICD-10-CM | POA: Diagnosis not present

## 2015-12-13 DIAGNOSIS — I10 Essential (primary) hypertension: Secondary | ICD-10-CM | POA: Diagnosis present

## 2015-12-13 DIAGNOSIS — E785 Hyperlipidemia, unspecified: Secondary | ICD-10-CM | POA: Diagnosis present

## 2015-12-13 DIAGNOSIS — I2510 Atherosclerotic heart disease of native coronary artery without angina pectoris: Secondary | ICD-10-CM | POA: Diagnosis present

## 2015-12-13 LAB — GLUCOSE, CAPILLARY
Glucose-Capillary: 163 mg/dL — ABNORMAL HIGH (ref 65–99)
Glucose-Capillary: 124 mg/dL — ABNORMAL HIGH (ref 65–99)
Glucose-Capillary: 125 mg/dL — ABNORMAL HIGH (ref 65–99)
Glucose-Capillary: 95 mg/dL (ref 65–99)

## 2015-12-13 LAB — BASIC METABOLIC PANEL
Anion gap: 13 (ref 5–15)
BUN: 27 mg/dL — ABNORMAL HIGH (ref 6–20)
CO2: 23 mmol/L (ref 22–32)
Calcium: 8.4 mg/dL — ABNORMAL LOW (ref 8.9–10.3)
Chloride: 97 mmol/L — ABNORMAL LOW (ref 101–111)
Creatinine, Ser: 1.57 mg/dL — ABNORMAL HIGH (ref 0.61–1.24)
GFR calc Af Amer: 47 mL/min — ABNORMAL LOW (ref >60)
GFR calc non Af Amer: 40 mL/min — ABNORMAL LOW (ref >60)
Glucose, Bld: 123 mg/dL — ABNORMAL HIGH (ref 65–99)
Potassium: 4 mmol/L (ref 3.5–5.1)
Sodium: 133 mmol/L — ABNORMAL LOW (ref 135–145)

## 2015-12-13 LAB — CBC
HCT: 32.1 % — ABNORMAL LOW (ref 39.0–52.0)
Hemoglobin: 10 g/dL — ABNORMAL LOW (ref 13.0–17.0)
MCH: 25.3 pg — ABNORMAL LOW (ref 26.0–34.0)
MCHC: 31.2 g/dL (ref 30.0–36.0)
MCV: 81.1 fL (ref 78.0–100.0)
Platelets: 209 10*3/uL (ref 150–400)
RBC: 3.96 MIL/uL — ABNORMAL LOW (ref 4.22–5.81)
RDW: 15.9 % — ABNORMAL HIGH (ref 11.5–15.5)
WBC: 8.1 10*3/uL (ref 4.0–10.5)

## 2015-12-13 MED ORDER — LIDOCAINE HCL (PF) 1 % IJ SOLN
INTRAMUSCULAR | Status: AC
  Filled 2015-12-13: qty 10

## 2015-12-13 NOTE — Progress Notes (Signed)
Patient ID: [name], male   DOB: [birth year], 82 y.o.   MRN: [id number]  [facility] Surgery Progress Note     Subjective: Resting comfortably in chair. Continues to have left sided rib pain. BM this morning no flatus. Tolerating clear liquids. No problems with urination. Denies CP, SOB, n/v.  Objective: Vital signs in last 24 hours: Temp:  [98 F (36.7 C)-98.6 F (37 C)] 98.6 F (37 C) (09/06 0520) Pulse Rate:  [88-98] 96 (09/06 0520) Resp:  [13-21] 15 (09/05 1915) BP: (125-177)/(64-88) 144/65 (09/06 0520) SpO2:  [94 %-99 %] 97 % (09/06 0520) Weight:  [219 lb (99.3 kg)-220 lb (99.8 kg)] 219 lb 8 oz (99.6 kg) (09/05 1942) Last BM Date: 12/12/15  Intake/Output from previous day: 09/05 0701 - 09/06 0700 In: 1000 [I.V.:1000] Out: -  Intake/Output this shift: No intake/output data recorded.  PE: Gen:  Alert, NAD, pleasant Card:  RRR Pulm:  CTAB. Effort normal. Tender left ribs Abd: Soft, NT/ND, +BS, nontender  IS 2000  Lab Results:   Recent Labs  12/12/15 1513 12/13/15 0332  WBC 9.6 8.1  HGB 10.8* 10.0*  HCT 34.6* 32.1*  PLT 213 209   BMET  Recent Labs  12/12/15 1513 12/13/15 0332  NA 134* 133*  K 3.8 4.0  CL 101 97*  CO2 23 23  GLUCOSE 184* 123*  BUN 28* 27*  CREATININE 1.85* 1.57*  CALCIUM 9.2 8.4*   PT/INR No results for input(s): LABPROT, INR in the last 72 hours. CMP     Component Value Date/Time   NA 133 (L) 12/13/2015 0332   NA 131 (A) 11/17/2010   K 4.0 12/13/2015 0332   CL 97 (L) 12/13/2015 0332   CO2 23 12/13/2015 0332   GLUCOSE 123 (H) 12/13/2015 0332   BUN 27 (H) 12/13/2015 0332   CREATININE 1.57 (H) 12/13/2015 0332   CREATININE 1.37 (H) 11/16/2015 1117   CALCIUM 8.4 (L) 12/13/2015 0332   PROT 6.6 12/12/2015 1513   ALBUMIN 3.4 (L) 12/12/2015 1513   AST 18 12/12/2015 1513   ALT 13 (L) 12/12/2015 1513   ALKPHOS 37 (L) 12/12/2015 1513   BILITOT 0.6 12/12/2015 1513   GFRNONAA 40 (L) 12/13/2015 0332   GFRNONAA 50 (L)  08/30/2015 0805   GFRAA 47 (L) 12/13/2015 0332   GFRAA 58 (L) 08/30/2015 0805   Lipase     Component Value Date/Time   LIPASE 25 12/12/2015 1513       Studies/Results: Ct Abdomen Pelvis Wo Contrast  Result Date: 12/12/2015 CLINICAL DATA:  Fall down stairs a few days ago. Known left rib fractures. Abdominal pain. EXAM: CT ABDOMEN AND PELVIS WITHOUT CONTRAST TECHNIQUE: Multidetector CT imaging of the abdomen and pelvis was performed following the standard protocol without IV contrast. COMPARISON:  12/12/2015 and 12/09/2015 FINDINGS: Lower chest: High density left pleural effusion with dependent densities along the pleural margin indicating hemothorax. I can't really estimate the volume because we do not include the upper margin of the hemothorax on today' s abdomen CT but this could be over 500 cc in volume. Today' s exam was performed without IV contrast in the side on have way of knowing if there is active bleeding. This hemothorax is associated with fractures of the left eleventh, tenth, ninth, and eighth ribs, with the tenth rib fracture segmental. There is associated passive atelectasis in the left lower lobe along with subsegmental atelectasis in the right lower lobe. Aortic and coronary artery atherosclerosis noted. Hepatobiliary: Cholecystectomy. Pancreas: Unremarkable Spleen:  No perisplenic fluid collection is identified to suggest a splenic rupture. Today' s exam is without IV contrast and accordingly not sensitive for small splenic lacerations. Adrenals/Urinary Tract: Unremarkable Stomach/Bowel: Dilated colon with air-fluid levels including in the distal colon which often correlates with a diarrheal process. Vascular/Lymphatic: Aortoiliac atherosclerotic vascular disease. There is an isolated right pelvic sidewall lymph node measuring 1 cm in diameter, not appreciably changed from 2009 and accordingly likely incidental. Reproductive: Enlarged prostate gland, 6.0 by 4.7 by 5.6 cm (volume = 83  cm^3). Other: No supplemental non-categorized findings. Musculoskeletal: The left posterior rib fractures have already been discussed above. Fused sacroiliac joints. Degenerative arthropathy of both hips. IMPRESSION: 1. Fractures of the left eleventh through eighth ribs, with the tenth rib fracture being segmental and several of these rib fractures being displaced. , There is a left hemothorax volume uncertain because we do not include the top of the hemothorax, but potentially over 500 cc. 2. Air- fluid levels in the distal colon may reflect a diarrheal process. The colon is somewhat distended which may alternatively indicate ileus. 3. Enlarged prostate gland, 83 cc. 4. I do not see a perisplenic fluid collection to suggest a significant splenic rupture. 5. Today' s exam was performed without IV contrast and accordingly is not highly sensitive for smaller solid organ lacerations. Electronically Signed   By: Van Clines M.D.   On: 12/12/2015 23:02   Dg Chest 1 View  Result Date: 12/12/2015 CLINICAL DATA:  Left-sided rib fractures. EXAM: CHEST 1 VIEW COMPARISON:  Chest abdomen study performed at same time. FINDINGS: Lateral view of the chest shows small to moderate left pleural effusion with left base collapse/ consolidation better seen on the frontal projection included as part of a separate exam. No evidence for thoracic spine fracture. IMPRESSION: Small to moderate left pleural effusion. Please see chest abdomen study reported separately. Electronically Signed   By: Misty Stanley M.D.   On: 12/12/2015 14:04   Dg Abd Acute W/chest  Result Date: 12/12/2015 CLINICAL DATA:  Lower abdominal pain and nausea, follow-up of multiple left-sided rib fractures. EXAM: DG ABDOMEN ACUTE W/ 1V CHEST COMPARISON:  CT scan of the abdomen and pelvis of December 09, 2015. FINDINGS: There is volume loss on the right consistent with a moderate-sized pleural effusion. There is no pneumothorax. The right lung is adequately  inflated and clear. The heart is not clearly enlarged. The pulmonary vascularity is normal. There is calcification in the wall of the aortic arch. There is multilevel degenerative disc disease of the thoracic spine. Fractures of the posterior lateral aspects of the ninth, tenth, and eleventh ribs are visible on the abdominal images. Within the abdomen there are loops of moderately distended gas-filled bowel. There is a moderate amount of stool in the right colon. No free extraluminal gas collections are observed. There are numerous surgical clips in the left upper and mid abdomen and in the right upper quadrant. There are surgical clips projecting over the prostate bed. There degenerative changes of the lumbar spine. IMPRESSION: 1. New left pleural effusion likely related to known ninth through eleventh posterior left rib fractures. No pneumothorax. Mild infrahilar atelectasis on the right. 2. Aortic atherosclerosis. 3. Loops of distended bowel, likely small bowel, in the mid and upper abdomen with small air-fluid levels. This likely reflects an ileus. There is no definite rectal gas. No free air is observed. 4. Degenerative changes of the lumbar spine. Electronically Signed   By: David  Martinique M.D.   On: 12/12/2015  14:01    Anti-infectives: Anti-infectives    None       Assessment/Plan Fall 12/09/15 L rib FX X3 with moderate HTX - repeat CXR pending - pulmonary toilet. Continue IS, encouraged ambulation Ileus - IVF, no NGT at this time - continue miralax, colace Hyponatremic - 133 today, continue IVF Elevated BUN/Cr - slowly trending down 27/1.57, continue IVF Anemic - down to 10 today from 10.8, continue to monitor DM/HTN - home meds  VTE - lovenox, SCD's FEN - clears, IVF  Plan - CXR performed but pending (if effusion worsens significantly may need CT)   LOS: 0 days    Jerrye Beavers , [facility] Surgery 12/13/2015, 7:29 AM Pager: [phone] Consults:  [phone] Mon-Fri 7:00 am-4:30 pm Sat-Sun 7:00 am-11:30 am

## 2015-12-13 NOTE — Care Management Obs Status (Signed)
[place] NOTIFICATION   Patient Details  Name: [name] MRN: [id number] Date of Birth: January 08, 1934   Medicare Observation Status Notification Given:  Yes (explained obs letter and answered patient's questions)    [name], RN 12/13/2015, 7:03 PM

## 2015-12-13 NOTE — Procedures (Signed)
Successful US guided left thoracentesis. Yielded 1 liter of dark bloody fluid. Pt tolerated procedure well. No immediate complications.  CXR ordered.  Dorathea Faerber S Jaisean Monteforte PA-C 12/13/2015 3:07 PM

## 2015-12-14 ENCOUNTER — Inpatient Hospital Stay (HOSPITAL_COMMUNITY): Payer: Commercial Managed Care - HMO

## 2015-12-14 DIAGNOSIS — R7989 Other specified abnormal findings of blood chemistry: Secondary | ICD-10-CM | POA: Diagnosis present

## 2015-12-14 DIAGNOSIS — W19XXXA Unspecified fall, initial encounter: Secondary | ICD-10-CM | POA: Diagnosis present

## 2015-12-14 DIAGNOSIS — K567 Ileus, unspecified: Secondary | ICD-10-CM | POA: Diagnosis present

## 2015-12-14 HISTORY — DX: Ileus, unspecified: K56.7

## 2015-12-14 HISTORY — DX: Other specified abnormal findings of blood chemistry: R79.89

## 2015-12-14 LAB — BASIC METABOLIC PANEL
Anion gap: 8 (ref 5–15)
BUN: 29 mg/dL — ABNORMAL HIGH (ref 6–20)
CO2: 20 mmol/L — ABNORMAL LOW (ref 22–32)
Calcium: 8.5 mg/dL — ABNORMAL LOW (ref 8.9–10.3)
Chloride: 105 mmol/L (ref 101–111)
Creatinine, Ser: 1.6 mg/dL — ABNORMAL HIGH (ref 0.61–1.24)
GFR calc Af Amer: 46 mL/min — ABNORMAL LOW (ref >60)
GFR calc non Af Amer: 39 mL/min — ABNORMAL LOW (ref >60)
Glucose, Bld: 85 mg/dL (ref 65–99)
Potassium: 4.3 mmol/L (ref 3.5–5.1)
Sodium: 133 mmol/L — ABNORMAL LOW (ref 135–145)

## 2015-12-14 LAB — GLUCOSE, CAPILLARY
Glucose-Capillary: 101 mg/dL — ABNORMAL HIGH (ref 65–99)
Glucose-Capillary: 90 mg/dL (ref 65–99)

## 2015-12-14 MED ORDER — TRAMADOL HCL 50 MG PO TABS: 50.0000 mg | ORAL_TABLET | Freq: Four times a day (QID) | ORAL | 0 refills | Status: DC | PRN

## 2015-12-14 NOTE — Progress Notes (Signed)
Pt discharged to home.  Discharge instructions explained to pt.  Pt has no questions at the time of discharge.  Pt states he has all belongings.  IV removed.  Pt taken off unit via wheelchair by staff.

## 2015-12-14 NOTE — Discharge Instructions (Signed)
Increase activity at pain allows.

## 2015-12-14 NOTE — Care Management Note (Signed)
Case Management Note  Patient Details  Name: [name] MRN: [id number] Date of Birth: 11/08/1933  Subjective/Objective:   Pt admitted on 12/12/15 s/p fall with Lt rib fractures and hemopneumothorax.  PTA, pt independent, lives with spouse.                   Action/Plan: Pt for dc home today; ambulating independently without assistive device.  Wife to provide assistance at dc.  No dc needs identified.    Expected Discharge Date:    12/14/2015              Expected Discharge Plan:  Home/Self Care  In-House Referral:     Discharge planning Services  CM Consult  Post Acute Care Choice:    Choice offered to:     DME Arranged:    DME Agency:     HH Arranged:    HH Agency:     Status of Service:  Completed, signed off  If discussed at [employer] of Stay Meetings, dates discussed:    Additional Comments:  Reinaldo Raddle, RN, BSN  Trauma/Neuro ICU Case Manager [phone]

## 2015-12-14 NOTE — Progress Notes (Signed)
Patient ID: [name], male   DOB: 1934/03/03, 82 y.o.   MRN: [id number]   LOS: 1 day   Subjective: Feeling much better, breathing better. Denies N/V, +BM's.   Objective: Vital signs in last 24 hours: Temp:  [97.5 F (36.4 C)-98 F (36.7 C)] 98 F (36.7 C) (09/07 0416) Pulse Rate:  [58-78] 61 (09/07 0416) Resp:  [17-18] 18 (09/07 0416) BP: (98-156)/(51-94) 156/67 (09/07 0416) SpO2:  [96 %-100 %] 96 % (09/07 0416) Last BM Date: 12/13/15   Laboratory Results CBG (last 3)   Recent Labs  12/13/15 1214 12/13/15 1734 12/13/15 2225  GLUCAP 124* 125* 95    Physical Exam General appearance: alert and no distress Resp: clear to auscultation bilaterally Cardio: regular rate and rhythm GI: normal findings: bowel sounds normal and soft, non-tender   Assessment/Plan: Fall  L rib FX X3 with moderate HTX s/p thoracentesis -- 1L taken off yesterday Ileus -- Resolved, advance diet to carb mod Elevated BUN/Cr - Continue IVF, check this pm ABL anemia - Stable DM/HTN - home meds FEN - As above VTE - Lovenox, SCD's Dispo - Home this afternoon if tolerates diet and CXR looks good    Lisette Abu, PA-C Pager: [phone] General Trauma PA Pager: [phone]  12/14/2015

## 2015-12-14 NOTE — Discharge Summary (Signed)
Physician Discharge Summary  Patient ID: [name] MRN: [id number] DOB/AGE: 80-Apr-1935 80 y.o.  Admit date: 12/12/2015 Discharge date: 12/14/2015  Discharge Diagnoses Patient Active Problem List   Diagnosis Date Noted  . Fall 12/14/2015  . Elevated serum creatinine 12/14/2015  . Ileus ([place]) 12/14/2015  . Multiple fractures of ribs of left side 12/12/2015  . Blood in the urine 12/01/2013  . Cystitis, radiation 12/01/2013  . Malignant neoplasm of prostate ([place]) 11/26/2013  . Pulsatile tinnitus 11/19/2013  . Rectal bleeding 12/23/2012  . Type II or unspecified type diabetes mellitus without mention of complication, not stated as uncontrolled   . Essential hypertension, benign   . Chronic back pain   . ED (erectile dysfunction)   . Anemia   . Hyperlipidemia   . CAD, NATIVE VESSEL 05/08/2008  . VENTRICULAR TACHYCARDIA 05/08/2008  . PREMATURE VENTRICULAR CONTRACTIONS 05/08/2008    Consultants None  Procedures 9/6 -- Thoracentesis by Calvert Cantor, PA-C   HPI: [name] tripped and fell on Saturday morning. He had a brief loss of consciousness. He was evaluated at urgent care and diagnosed with left rib fxs x3. He was managing at home but began to have problems with nausea and constipation starting Sunday morning. He returned to urgent care today where x-rays showed a moderate pleural effusion that is likely a HTX and evidence of an ileus. His PCP called the trauma service and we recommended admission.    Hospital Course: A repeat chest x-ray the following day showed some extension of the effusion. Interventional radiology was asked to drain it and removed about 1 liter of bloody fluid. His ileus had resolved by this point and he was having regular bowel movements.His pain was controlled on oral medication. He had a likely acute blood loss anemia that remained stable during his stay.His admission creatinine was 1.85 and, while it came down to ~1.6 with resuscitation, did not return  to his apparent baseline of about 1.4. He was asked to follow-up with his PCP the following week to make sure his effusion was not re-accumulating and to address his CKD. He was discharged home in good condition.     Medication List    TAKE these medications   atorvastatin 10 MG tablet Commonly known as:  LIPITOR Take 1 tablet (10 mg total) by mouth daily.   Fish Oil 1000 MG Caps Take 2 capsules by mouth daily.   glucose blood test strip Commonly known as:  TRUETEST TEST USE AS DIRECTED THREE TIMES DAILY   HYDROcodone-acetaminophen 5-325 MG tablet Commonly known as:  NORCO/VICODIN Take 1-2 tablets by mouth every 6 (six) hours as needed for moderate pain.   insulin NPH-regular Human (70-30) 100 UNIT/ML injection Commonly known as:  NOVOLIN 70/30 Inject 7 Units into the skin 2 (two) times daily with a meal.   Insulin Syringe-Needle U-100 31G X 5/16" 0.3 ML Misc Commonly known as:  B-D INS SYR HALF-UNIT .3CC/31G Use daily at bedtime to inject insulin. Dx code: 250.00   lisinopril 20 MG tablet Commonly known as:  PRINIVIL,ZESTRIL Take 20 mg by mouth daily.   lisinopril-hydrochlorothiazide 20-12.5 MG tablet Commonly known as:  PRINZIDE,ZESTORETIC Take 1 tablet by mouth daily.   metFORMIN 1000 MG tablet Commonly known as:  GLUCOPHAGE Take 1 tablet (1,000 mg total) by mouth 2 (two) times daily with a meal.   metoprolol 50 MG tablet Commonly known as:  LOPRESSOR Take 1 tablet (50 mg total) by mouth 2 (two) times daily.   multivitamin tablet Take  1 tablet by mouth daily.   sildenafil 50 MG tablet Commonly known as:  VIAGRA Take 1 tablet (50 mg total) by mouth daily as needed for erectile dysfunction.   traMADol 50 MG tablet Commonly known as:  ULTRAM Take 1-2 tablets (50-100 mg total) by mouth every 6 (six) hours as needed (Pain).   Vitamin D (Cholecalciferol) 1000 units Tabs Take 1 tablet by mouth daily.        Follow-up Information    [facility] .   Why:  Call as needed Contact information: [address] [id number] Chalfant Newton, MD. Schedule an appointment as soon as possible for a visit in 1 week(s).   Specialties:  Family Medicine, Sports Medicine Contact information: [address] [place] [address] [phone]            Signed: Bryn Gulling Pager: [phone] General Trauma PA Pager: [phone] 12/14/2015, 3:57 PM

## 2015-12-14 NOTE — ED Provider Notes (Signed)
The patient was not seen by myself.  He was seen and evaluated by the trauma service after referral from the urgent care   Jola Schmidt, MD 12/14/15 [id number]

## 2015-12-19 ENCOUNTER — Ambulatory Visit (INDEPENDENT_AMBULATORY_CARE_PROVIDER_SITE_OTHER): Payer: Commercial Managed Care - HMO

## 2015-12-19 ENCOUNTER — Ambulatory Visit (INDEPENDENT_AMBULATORY_CARE_PROVIDER_SITE_OTHER): Payer: Commercial Managed Care - HMO | Admitting: Family Medicine

## 2015-12-19 ENCOUNTER — Encounter: Payer: Self-pay | Admitting: Family Medicine

## 2015-12-19 VITALS — BP 136/82 | HR 76 | Temp 97.7°F | Resp 17 | Ht 72.0 in | Wt 221.0 lb

## 2015-12-19 DIAGNOSIS — R0789 Other chest pain: Secondary | ICD-10-CM | POA: Diagnosis not present

## 2015-12-19 DIAGNOSIS — D62 Acute posthemorrhagic anemia: Secondary | ICD-10-CM

## 2015-12-19 DIAGNOSIS — R1012 Left upper quadrant pain: Secondary | ICD-10-CM

## 2015-12-19 DIAGNOSIS — R748 Abnormal levels of other serum enzymes: Secondary | ICD-10-CM

## 2015-12-19 DIAGNOSIS — J90 Pleural effusion, not elsewhere classified: Secondary | ICD-10-CM

## 2015-12-19 DIAGNOSIS — S2232XD Fracture of one rib, left side, subsequent encounter for fracture with routine healing: Secondary | ICD-10-CM

## 2015-12-19 DIAGNOSIS — R109 Unspecified abdominal pain: Secondary | ICD-10-CM

## 2015-12-19 DIAGNOSIS — R7989 Other specified abnormal findings of blood chemistry: Secondary | ICD-10-CM

## 2015-12-19 LAB — BASIC METABOLIC PANEL
BUN: 17 mg/dL (ref 7–25)
CO2: 26 mmol/L (ref 20–31)
Calcium: 9 mg/dL (ref 8.6–10.3)
Chloride: 104 mmol/L (ref 98–110)
Creat: 1.33 mg/dL — ABNORMAL HIGH (ref 0.70–1.18)
Glucose, Bld: 99 mg/dL (ref 65–99)
Potassium: 4.9 mmol/L (ref 3.5–5.3)
Sodium: 138 mmol/L (ref 135–146)

## 2015-12-19 LAB — POCT CBC
Granulocyte percent: 69.2 %G (ref 37–80)
HCT, POC: 28.3 % — AB (ref 43.5–53.7)
Hemoglobin: 9.7 g/dL — AB (ref 14.1–18.1)
Lymph, poc: 2.1 (ref 0.6–3.4)
MCH, POC: 26.2 pg — AB (ref 27–31.2)
MCHC: 34.3 g/dL (ref 31.8–35.4)
MCV: 76.5 fL — AB (ref 80–97)
MID (cbc): 0.2 (ref 0–0.9)
MPV: 7.8 fL (ref 0–99.8)
POC Granulocyte: 5.2 (ref 2–6.9)
POC LYMPH PERCENT: 27.9 %L (ref 10–50)
POC MID %: 2.9 %M (ref 0–12)
Platelet Count, POC: 292 10*3/uL (ref 142–424)
RBC: 3.7 M/uL — AB (ref 4.69–6.13)
RDW, POC: 16.5 %
WBC: 7.5 10*3/uL (ref 4.6–10.2)

## 2015-12-19 MED ORDER — HYDROCODONE-ACETAMINOPHEN 5-325 MG PO TABS: 1.0000 | ORAL_TABLET | Freq: Four times a day (QID) | ORAL | 0 refills | Status: DC | PRN

## 2015-12-19 NOTE — Progress Notes (Signed)
Subjective:S  By signing my name below, I, [name], attest that this documentation has been prepared under the direction and in the presence of [name], MD. Electronically Signed: [name], [place]. 12/19/2015 , 3:14 PM .  Patient was seen in Room 12 .   Patient ID: [name], male    DOB: October 23, 1933, 82 y.o.   MRN: [id number] Chief Complaint  Patient presents with   Follow-up    Rib fractures.    HPI [name] is a 82 y.o. male Here for hospital follow up. He was sent to the ER on Sept 5th for multiple left sided rib fractures from contusion on Sept 2nd. He was noted to have pleural effusion and increased in pain, admitted by trauma. He was discharged on Sept 7th. Interventional radiology removed 1L of bloody fluid by thoracentesis from pleural effusion. Initially had ileus that had improved on its own. Advised to follow up with me for follow up chest xray that pleural effusion wasn't accumulated. His hemoglobin was 10.0 at discharge.   Patient states that his breathing has improved after the fluid was drained. He has decreased cough but notes to still have some "rattling". He mentions still having some soreness in his lower abdomen and able to pass gas. He was given tramadol, taking 2 tablets every 6~8 hours, however he states he's not having much relief. He still notes chest soreness when he coughs or sneezes. He's been sleeping in a chair instead of sleeping in bed.   Prostate Cancer He has a h/o prostate cancer. His last PSA was 0.26, done by Dr. [name] with [facility].   CKD He had creatinine at 1.8 while in hospital, and then downtrend to 1.6 with fluids. His baseline is around 1.4.   Patient Active Problem List   Diagnosis Date Noted   Fall 12/14/2015   Elevated serum creatinine 12/14/2015   Ileus ([place]) 12/14/2015   Multiple fractures of ribs of left side 12/12/2015   Blood in the urine 12/01/2013   Cystitis, radiation 12/01/2013     Malignant neoplasm of prostate ([place]) 11/26/2013   Pulsatile tinnitus 11/19/2013   Rectal bleeding 12/23/2012   Type II or unspecified type diabetes mellitus without mention of complication, not stated as uncontrolled    Essential hypertension, benign    Chronic back pain    ED (erectile dysfunction)    Anemia    Hyperlipidemia    CAD, NATIVE VESSEL 05/08/2008   VENTRICULAR TACHYCARDIA 05/08/2008   PREMATURE VENTRICULAR CONTRACTIONS 05/08/2008   Past Medical History:  Diagnosis Date   Allergy    CAD (coronary artery disease)    pci to LAD 10/09; stable CAD by cath 05/31/08; myoview 04/11/11- no ishcemia, EF 67%; echo 05/15/09- EF>55%, mod calcification of the aortic valve leaflets   Chronic back pain    ED (erectile dysfunction)    Essential hypertension, benign    Hyperlipidemia    Multiple rib fractures    left 9th, 10th, and 11th posterior rib fractures S/P fall 12/09/2015/notes 12/12/2015   Prostate cancer ([place])    s/p   Type II or unspecified type diabetes mellitus without mention of complication, not stated as uncontrolled    Ventricular tachycardia ([place])    resuscitated; monitor 05/2008; attempted T ablation 2/10- aborted due to inappropriate substrate   Past Surgical History:  Procedure Laterality Date   CARDIAC CATHETERIZATION  05/31/08   EF 55-60%, stable lesions, medical therapy   COLECTOMY  '80's   polyps  CORONARY ANGIOPLASTY WITH STENT PLACEMENT  01/19/08   PCI stent to mid LAD with Promus DES 27.5x28   PTCA  01/2008   No Known Allergies Prior to Admission medications   Medication Sig Start Date End Date Taking? Authorizing Provider  atorvastatin (LIPITOR) 10 MG tablet Take 1 tablet (10 mg total) by mouth daily. 11/16/15  Yes [name], MD  glucose blood (TRUETEST TEST) test strip USE AS DIRECTED THREE TIMES DAILY 06/21/13  Yes [name], MD  HYDROcodone-acetaminophen (NORCO/VICODIN) 5-325 MG tablet Take 1-2 tablets by mouth  every 6 (six) hours as needed for moderate pain. 12/09/15  Yes [name], MD  insulin NPH-regular Human (NOVOLIN 70/30) (70-30) 100 UNIT/ML injection Inject 7 Units into the skin 2 (two) times daily with a meal. 11/16/15  Yes [name], MD  Insulin Syringe-Needle U-100 (B-D INS SYR HALF-UNIT .3CC/31G) 31G X 5/16" 0.3 ML MISC Use daily at bedtime to inject insulin. Dx code: 250.00 06/21/13  Yes [name], MD  lisinopril (PRINIVIL,ZESTRIL) 20 MG tablet Take 20 mg by mouth daily.   Yes Historical Provider, MD  lisinopril-hydrochlorothiazide (PRINZIDE,ZESTORETIC) 20-12.5 MG tablet Take 1 tablet by mouth daily. 11/16/15  Yes [name], MD  metFORMIN (GLUCOPHAGE) 1000 MG tablet Take 1 tablet (1,000 mg total) by mouth 2 (two) times daily with a meal. 11/16/15  Yes [name], MD  metoprolol (LOPRESSOR) 50 MG tablet Take 1 tablet (50 mg total) by mouth 2 (two) times daily. 11/16/15  Yes [name], MD  Multiple Vitamin (MULTIVITAMIN) tablet Take 1 tablet by mouth daily.   Yes Historical Provider, MD  Omega-3 Fatty Acids (FISH OIL) 1000 MG CAPS Take 2 capsules by mouth daily.    Yes Historical Provider, MD  sildenafil (VIAGRA) 50 MG tablet Take 1 tablet (50 mg total) by mouth daily as needed for erectile dysfunction. 02/08/15  Yes Lorretta Harp, MD  traMADol (ULTRAM) 50 MG tablet Take 1-2 tablets (50-100 mg total) by mouth every 6 (six) hours as needed (Pain). 12/14/15  Yes Lisette Abu, PA-C  Vitamin D, Cholecalciferol, 1000 UNITS TABS Take 1 tablet by mouth daily.    Yes Historical Provider, MD   Social History   Social History   Marital status: Legally Separated    Spouse name: [name]   Number of children: 7   Years of education: [name]   Occupational History   RETIRED Retired   Social History Main Topics   Smoking status: Current Some Day Smoker    Packs/day: 0.50    Years: 15.00    Types: Cigarettes    Last attempt to quit: 09/01/1990   Smokeless  tobacco: Never Used     Comment: quit 12/07/2013   Alcohol use No   Drug use: No   Sexual activity: No   Other Topics Concern   Not on file   Social History Narrative   29 grandchildren. Single. Education: 12 th grade. Exercise: 3-4 times a week for 30 minutes working out and set ups.   Review of Systems  Constitutional: Negative for chills, fatigue and fever.  Respiratory: Positive for cough and chest tightness (soreness). Negative for shortness of breath and wheezing.   Gastrointestinal: Negative for diarrhea, nausea and vomiting.  Musculoskeletal: Positive for myalgias. Negative for arthralgias and gait problem.       Objective:   Physical Exam  Constitutional: He is oriented to person, place, and time. He appears well-developed and well-nourished.  HENT:  Head: Normocephalic and atraumatic.  Eyes: EOM are normal. Pupils are equal, round, and reactive to light.  Neck: No JVD present. Carotid bruit is not present.  Cardiovascular: Normal rate, regular rhythm and normal heart sounds.   No murmur heard. Pulmonary/Chest: Effort normal and breath sounds normal. He has no rales.  Musculoskeletal: He exhibits no edema.  There is bruising along left flank to the left beltline with underlying tenderness along into the left chest wall; there is also pain along lateral chest wall and lateral scapular line   Neurological: He is alert and oriented to person, place, and time.  Skin: Skin is warm and dry.  Psychiatric: He has a normal mood and affect.  Vitals reviewed.   Vitals:   12/19/15 1414  BP: 136/82  Pulse: 76  Resp: 17  Temp: 97.7 F (36.5 C)  TempSrc: Oral  SpO2: 95%  Weight: 221 lb (100.2 kg)  Height: 6' (1.829 m)   Results for orders placed or performed in visit on 12/19/15  POCT CBC  Result Value Ref Range   WBC 7.5 4.6 - 10.2 K/uL   Lymph, poc 2.1 0.6 - 3.4   POC LYMPH PERCENT 27.9 10 - 50 %L   MID (cbc) 0.2 0 - 0.9   POC MID % 2.9 0 - 12 %M   POC  Granulocyte 5.2 2 - 6.9   Granulocyte percent 69.2 37 - 80 %G   RBC 3.70 (A) 4.69 - 6.13 M/uL   Hemoglobin 9.7 (A) 14.1 - 18.1 g/dL   HCT, POC 28.3 (A) 43.5 - 53.7 %   MCV 76.5 (A) 80 - 97 fL   MCH, POC 26.2 (A) 27 - 31.2 pg   MCHC 34.3 31.8 - 35.4 g/dL   RDW, POC 16.5 %   Platelet Count, POC 292 142 - 424 K/uL   MPV 7.8 0 - 99.8 fL   Dg Chest 2 View  Result Date: 12/19/2015 CLINICAL DATA:  History of left rib fractures with a pleural effusion. EXAM: CHEST  2 VIEW COMPARISON:  Single-view of the chest 12/14/2015 and 12/13/2015. FINDINGS: Small left pleural effusion has increased since the prior exams. There is associated left basilar airspace disease. The right lung is clear. No pneumothorax. Heart size is mildly enlarged. Aortic atherosclerosis is noted. The patient's known left rib fractures are not seen on this examination. IMPRESSION: Some increase in a small left pleural effusion and left basilar airspace disease. Negative for pneumothorax. Atherosclerosis. Electronically Signed   By: Inge Rise M.D.   On: 12/19/2015 15:25       Assessment & Plan:    CUYLER VANDYKEN is a 82 y.o. male Pleural effusion - Plan: DG Chest 2 View Rib fractures, left, with routine healing, subsequent encounter - Plan: DG Chest 2 View  - Prior hemothorax, s/p thoracentesis by IR.  Slight reaccumulation, but overall improving sx's, not dyspneic, no f/c. Discussed with trauma surgery. Based on only some recurrence and improving sx's, can continue to monitor outpatient.   -change ultram to vicodin as better pain relief. Long term risks discussed, side effects discussed.   -continue incentive spirometer.   -recheck with CXR in 4 days.   Elevated serum creatinine - Plan: Basic metabolic panel  - repeat BMP, maintain hydration.   Acute blood loss anemia - Plan: POCT CBC  - likely form hemothorax/rib fx's. Similar to hospital discharge, asx. Repeat in 4 days.   Left-sided chest wall pain - Plan:  HYDROcodone-acetaminophen (NORCO/VICODIN) 5-325 MG tablet  Acute left flank pain - Plan: HYDROcodone-acetaminophen (NORCO/VICODIN) 5-325 MG tablet  -as above.   Meds ordered this encounter  Medications   HYDROcodone-acetaminophen (NORCO/VICODIN) 5-325 MG tablet    Sig: Take 1-2 tablets by mouth every 6 (six) hours as needed for moderate pain.    Dispense:  20 tablet    Refill:  0   Patient Instructions   There was a small amount of fluid collection in your lung, but on discussion with the trauma surgeon, this is not unexpected.   Your hemoglobin or blood count is also borderline lower than hospital stay, but very close to what the level was at hospital discharge. No change in treatment at this time, other than the hydrocodone if needed for pain as the tramadol has not been as much benefit. Make sure you're taking deep breaths throughout the day, and follow-up with me this Saturday to repeat chest x-ray and blood count. If you have any increased shortness of breath, worsening pain or abdominal pain, or other worsening symptoms, return here or the emergency room.    Rib Fracture A rib fracture is a break or crack in one of the bones of the ribs. The ribs are a group of long, curved bones that wrap around your chest and attach to your spine. They protect your lungs and other organs in the chest cavity. A broken or cracked rib is often painful, but most do not cause other problems. Most rib fractures heal on their own over time. However, rib fractures can be more serious if multiple ribs are broken or if broken ribs move out of place and push against other structures. CAUSES   A direct blow to the chest. For example, this could happen during contact sports, a car accident, or a fall against a hard object.  Repetitive movements with high force, such as pitching a baseball or having severe coughing spells. SYMPTOMS   Pain when you breathe in or cough.  Pain when someone presses on the injured  area. DIAGNOSIS  Your caregiver will perform a physical exam. Various imaging tests may be ordered to confirm the diagnosis and to look for related injuries. These tests may include a chest X-ray, computed tomography (CT), magnetic resonance imaging (MRI), or a bone scan. TREATMENT  Rib fractures usually heal on their own in 1-3 months. The longer healing period is often associated with a continued cough or other aggravating activities. During the healing period, pain control is very important. Medication is usually given to control pain. Hospitalization or surgery may be needed for more severe injuries, such as those in which multiple ribs are broken or the ribs have moved out of place.  HOME CARE INSTRUCTIONS   Avoid strenuous activity and any activities or movements that cause pain. Be careful during activities and avoid bumping the injured rib.  Gradually increase activity as directed by your caregiver.  Only take over-the-counter or prescription medications as directed by your caregiver. Do not take other medications without asking your caregiver first.  Apply ice to the injured area for the first 1-2 days after you have been treated or as directed by your caregiver. Applying ice helps to reduce inflammation and pain.  Put ice in a plastic bag.  Place a towel between your skin and the bag.   Leave the ice on for 15-20 minutes at a time, every 2 hours while you are awake.  Perform deep breathing as directed by your caregiver. This will help prevent pneumonia, which is a  common complication of a broken rib. Your caregiver may instruct you to:  Take deep breaths several times a day.  Try to cough several times a day, holding a pillow against the injured area.  Use a device called an incentive spirometer to practice deep breathing several times a day.  Drink enough fluids to keep your urine clear or pale yellow. This will help you avoid constipation.   Do not wear a rib belt or  binder. These restrict breathing, which can lead to pneumonia.  SEEK IMMEDIATE MEDICAL CARE IF:   You have a fever.   You have difficulty breathing or shortness of breath.   You develop a continual cough, or you cough up thick or bloody sputum.  You feel sick to your stomach (nausea), throw up (vomit), or have abdominal pain.   You have worsening pain not controlled with medications.  MAKE SURE YOU:  Understand these instructions.  Will watch your condition.  Will get help right away if you are not doing well or get worse.   This information is not intended to replace advice given to you by your health care provider. Make sure you discuss any questions you have with your health care provider.   Document Released: 03/25/2005 Document Revised: 11/25/2012 Document Reviewed: 05/27/2012 Elsevier Interactive Patient Education 2016 [employer].    IF you received an x-ray today, you will receive an invoice from [facility]. Please contact [facility] at [phone] with questions or concerns regarding your invoice.   IF you received labwork today, you will receive an invoice from [employer]. Please contact [facility] at [phone] with questions or concerns regarding your invoice.   Our billing staff will not be able to assist you with questions regarding bills from these companies.  You will be contacted with the lab results as soon as they are available. The fastest way to get your results is to activate your My Chart account. Instructions are located on the last page of this paperwork. If you have not heard from [place] regarding the results in 2 weeks, please contact this office.        I personally performed the services described in this documentation, which was scribed in my presence. The recorded information has been reviewed and considered, and addended by me as needed.   Signed,   [name], MD [facility].  12/22/15 8:12 AM

## 2015-12-19 NOTE — Patient Instructions (Addendum)
There was a small amount of fluid collection in your lung, but on discussion with the trauma surgeon, this is not unexpected.   Your hemoglobin or blood count is also borderline lower than hospital stay, but very close to what the level was at hospital discharge. No change in treatment at this time, other than the hydrocodone if needed for pain as the tramadol has not been as much benefit. Make sure you're taking deep breaths throughout the day, and follow-up with me this Saturday to repeat chest x-ray and blood count. If you have any increased shortness of breath, worsening pain or abdominal pain, or other worsening symptoms, return here or the emergency room.    Rib Fracture A rib fracture is a break or crack in one of the bones of the ribs. The ribs are a group of long, curved bones that wrap around your chest and attach to your spine. They protect your lungs and other organs in the chest cavity. A broken or cracked rib is often painful, but most do not cause other problems. Most rib fractures heal on their own over time. However, rib fractures can be more serious if multiple ribs are broken or if broken ribs move out of place and push against other structures. CAUSES   A direct blow to the chest. For example, this could happen during contact sports, a car accident, or a fall against a hard object.  Repetitive movements with high force, such as pitching a baseball or having severe coughing spells. SYMPTOMS   Pain when you breathe in or cough.  Pain when someone presses on the injured area. DIAGNOSIS  Your caregiver will perform a physical exam. Various imaging tests may be ordered to confirm the diagnosis and to look for related injuries. These tests may include a chest X-ray, computed tomography (CT), magnetic resonance imaging (MRI), or a bone scan. TREATMENT  Rib fractures usually heal on their own in 1-3 months. The longer healing period is often associated with a continued cough or other  aggravating activities. During the healing period, pain control is very important. Medication is usually given to control pain. Hospitalization or surgery may be needed for more severe injuries, such as those in which multiple ribs are broken or the ribs have moved out of place.  HOME CARE INSTRUCTIONS   Avoid strenuous activity and any activities or movements that cause pain. Be careful during activities and avoid bumping the injured rib.  Gradually increase activity as directed by your caregiver.  Only take over-the-counter or prescription medications as directed by your caregiver. Do not take other medications without asking your caregiver first.  Apply ice to the injured area for the first 1-2 days after you have been treated or as directed by your caregiver. Applying ice helps to reduce inflammation and pain.  Put ice in a plastic bag.  Place a towel between your skin and the bag.   Leave the ice on for 15-20 minutes at a time, every 2 hours while you are awake.  Perform deep breathing as directed by your caregiver. This will help prevent pneumonia, which is a common complication of a broken rib. Your caregiver may instruct you to:  Take deep breaths several times a day.  Try to cough several times a day, holding a pillow against the injured area.  Use a device called an incentive spirometer to practice deep breathing several times a day.  Drink enough fluids to keep your urine clear or pale yellow. This will help  you avoid constipation.   Do not wear a rib belt or binder. These restrict breathing, which can lead to pneumonia.  SEEK IMMEDIATE MEDICAL CARE IF:   You have a fever.   You have difficulty breathing or shortness of breath.   You develop a continual cough, or you cough up thick or bloody sputum.  You feel sick to your stomach (nausea), throw up (vomit), or have abdominal pain.   You have worsening pain not controlled with medications.  MAKE SURE  YOU:  Understand these instructions.  Will watch your condition.  Will get help right away if you are not doing well or get worse.   This information is not intended to replace advice given to you by your health care provider. Make sure you discuss any questions you have with your health care provider.   Document Released: 03/25/2005 Document Revised: 11/25/2012 Document Reviewed: 05/27/2012 Elsevier Interactive Patient Education 2016 [employer].    IF you received an x-ray today, you will receive an invoice from [facility]. Please contact [facility] at [phone] with questions or concerns regarding your invoice.   IF you received labwork today, you will receive an invoice from [employer]. Please contact [facility] at [phone] with questions or concerns regarding your invoice.   Our billing staff will not be able to assist you with questions regarding bills from these companies.  You will be contacted with the lab results as soon as they are available. The fastest way to get your results is to activate your My Chart account. Instructions are located on the last page of this paperwork. If you have not heard from [place] regarding the results in 2 weeks, please contact this office.

## 2015-12-23 ENCOUNTER — Ambulatory Visit (INDEPENDENT_AMBULATORY_CARE_PROVIDER_SITE_OTHER): Payer: Commercial Managed Care - HMO

## 2015-12-23 ENCOUNTER — Ambulatory Visit (INDEPENDENT_AMBULATORY_CARE_PROVIDER_SITE_OTHER): Payer: Commercial Managed Care - HMO | Admitting: Physician Assistant

## 2015-12-23 VITALS — BP 130/70 | HR 79 | Temp 98.6°F | Resp 16 | Ht 72.0 in | Wt 220.6 lb

## 2015-12-23 DIAGNOSIS — J90 Pleural effusion, not elsewhere classified: Secondary | ICD-10-CM | POA: Diagnosis not present

## 2015-12-23 LAB — POCT CBC
Granulocyte percent: 76 %G (ref 37–80)
HCT, POC: 29.9 % — AB (ref 43.5–53.7)
Hemoglobin: 10 g/dL — AB (ref 14.1–18.1)
Lymph, poc: 1.7 (ref 0.6–3.4)
MCH, POC: 26.1 pg — AB (ref 27–31.2)
MCHC: 33.3 g/dL (ref 31.8–35.4)
MCV: 78.3 fL — AB (ref 80–97)
MID (cbc): 0.4 (ref 0–0.9)
MPV: 7.2 fL (ref 0–99.8)
POC Granulocyte: 6.6 (ref 2–6.9)
POC LYMPH PERCENT: 19.9 %L (ref 10–50)
POC MID %: 4.1 %M (ref 0–12)
Platelet Count, POC: 312 10*3/uL (ref 142–424)
RBC: 3.82 M/uL — AB (ref 4.69–6.13)
RDW, POC: 16.7 %
WBC: 8.7 10*3/uL (ref 4.6–10.2)

## 2015-12-23 NOTE — Progress Notes (Signed)
CBC improved. He is coming back in 7 days for a recheck with you. Philis Fendt, MS, PA-C 1:41 PM, 12/23/2015

## 2015-12-23 NOTE — Patient Instructions (Signed)
     IF you received an x-ray today, you will receive an invoice from [facility]. Please contact [facility] at [phone] with questions or concerns regarding your invoice.   IF you received labwork today, you will receive an invoice from [facility]. Please contact [facility] at [phone] with questions or concerns regarding your invoice.   Our billing staff will not be able to assist you with questions regarding bills from these companies.  You will be contacted with the lab results as soon as they are available. The fastest way to get your results is to activate your My Chart account. Instructions are located on the last page of this paperwork. If you have not heard from us regarding the results in 2 weeks, please contact this office.      

## 2015-12-23 NOTE — Progress Notes (Signed)
12/23/2015 1:29 PM   DOB: October 01, 1933 / MRN: [id number]  SUBJECTIVE:  [name] is a 82 y.o. male presenting for a recheck of pleural effusion. He is seeing Dr. Carlota Acosta for this problem and reports that he continues to improve albeit slowly. He is taking hydrocodone 5-325 and reports that he has enough of this at present.  States "I can still feel it in there" with regard to the effusion.  He denies SOB and chest pain.   He has No Known Allergies.   He  has a past medical history of Allergy; CAD (coronary artery disease); Chronic back pain; ED (erectile dysfunction); Essential hypertension, benign; Hyperlipidemia; Multiple rib fractures; Prostate cancer ([name]); Type II or unspecified type diabetes mellitus without mention of complication, not stated as uncontrolled; and Ventricular tachycardia ([place]).    He  reports that he has been smoking Cigarettes.  He has a 7.50 pack-year smoking history. He has never used smokeless tobacco. He reports that he does not drink alcohol or use drugs. He  reports that he does not engage in sexual activity. The patient  has a past surgical history that includes Mitral valve replacement (01/2008); Colectomy ('80's); Cardiac catheterization (05/31/08); and Coronary angioplasty with stent (01/19/08).  His family history includes Diabetes in his brother and mother; Heart attack in his mother; Heart disease in his brother and sister; Leukemia in his father.  Review of Systems  Constitutional: Negative for chills and fever.  Respiratory: Negative for cough and shortness of breath.   Cardiovascular: Positive for chest pain (posterioir right lower ribs). Negative for leg swelling.  Skin: Negative for itching and rash.    The problem list and medications were reviewed and updated by myself where necessary and exist elsewhere in the encounter.   OBJECTIVE:  BP 130/70 (BP Location: Left Arm, Cuff Size: Normal)   Pulse 79   Temp 98.6 F (37 C) (Oral)   Resp 16    Ht 6' (1.829 m)   Wt 220 lb 9.6 oz (100.1 kg)   SpO2 95%   BMI 29.92 kg/m   Physical Exam  Constitutional: He is oriented to person, place, and time.  Cardiovascular: Normal rate and regular rhythm.   Pulmonary/Chest: Effort normal and breath sounds normal.  Neurological: He is alert and oriented to person, place, and time.  Skin: Skin is warm and dry.    Results for orders placed or performed in visit on 12/23/15 (from the past 72 hour(s))  POCT CBC     Status: Abnormal   Collection Time: 12/23/15  9:28 AM  Result Value Ref Range   WBC 8.7 4.6 - 10.2 K/uL   Lymph, poc 1.7 0.6 - 3.4   POC LYMPH PERCENT 19.9 10 - 50 %L   MID (cbc) 0.4 0 - 0.9   POC MID % 4.1 0 - 12 %M   POC Granulocyte 6.6 2 - 6.9   Granulocyte percent 76.0 37 - 80 %G   RBC 3.82 (A) 4.69 - 6.13 M/uL   Hemoglobin 10.0 (A) 14.1 - 18.1 g/dL   HCT, POC 29.9 (A) 43.5 - 53.7 %   MCV 78.3 (A) 80 - 97 fL   MCH, POC 26.1 (A) 27 - 31.2 pg   MCHC 33.3 31.8 - 35.4 g/dL   RDW, POC 16.7 %   Platelet Count, POC 312 142 - 424 K/uL   MPV 7.2 0 - 99.8 fL   CBC Latest Ref Rng & Units 12/23/2015 12/19/2015 12/13/2015  WBC 4.6 -  10.2 K/uL 8.7 7.5 8.1  Hemoglobin 14.1 - 18.1 g/dL 10.0(A) 9.7(A) 10.0(L)  Hematocrit 43.5 - 53.7 % 29.9(A) 28.3(A) 32.1(L)  Platelets 150 - 400 K/uL - - 209     Dg Chest 2 View  Result Date: 12/23/2015 CLINICAL DATA:  Pleural effusion.  Status post drainage. EXAM: CHEST  2 VIEW COMPARISON:  12/19/2015 FINDINGS: Moderate left pleural effusion with associated atelectasis or infiltrate is stable. No right pleural effusion. Remainder of the lungs is clear. Heart, mediastinum and hila are unremarkable. Bony thorax is intact. IMPRESSION: 1. No significant change from the most recent prior exam. 2. Persistent moderate left pleural effusion with associated atelectasis, pneumonia or combination. 3. No right pleural effusion. Electronically Signed   By: Lajean Manes M.D.   On: 12/23/2015 09:36    ASSESSMENT AND  PLAN  [name] was seen today for follow-up.  Diagnoses and all orders for this visit:  Pleural effusion: Stable.  He feels well today.  Advise that he return next Saturday to check in with Dr. Carlota Acosta.  Of note, chest rads do include pneumonia in the differential, however CBC and symptomology do not suggest this is a likely diagnosis.   -     DG Chest 2 View; Future -     POCT CBC    The patient is advised to call or return to clinic if he does not see an improvement in symptoms, or to seek the care of the closest emergency department if he worsens with the above plan.   [name], MHS, PA-C [facility] 12/23/2015 1:29 PM

## 2015-12-30 ENCOUNTER — Encounter: Payer: Self-pay | Admitting: Family Medicine

## 2015-12-30 ENCOUNTER — Ambulatory Visit (INDEPENDENT_AMBULATORY_CARE_PROVIDER_SITE_OTHER): Payer: Commercial Managed Care - HMO | Admitting: Family Medicine

## 2015-12-30 ENCOUNTER — Ambulatory Visit (INDEPENDENT_AMBULATORY_CARE_PROVIDER_SITE_OTHER): Payer: Commercial Managed Care - HMO

## 2015-12-30 VITALS — BP 126/74 | HR 92 | Temp 98.5°F | Resp 18 | Ht 72.0 in | Wt 219.0 lb

## 2015-12-30 DIAGNOSIS — S2232XD Fracture of one rib, left side, subsequent encounter for fracture with routine healing: Secondary | ICD-10-CM

## 2015-12-30 DIAGNOSIS — J90 Pleural effusion, not elsewhere classified: Secondary | ICD-10-CM | POA: Diagnosis not present

## 2015-12-30 DIAGNOSIS — D62 Acute posthemorrhagic anemia: Secondary | ICD-10-CM

## 2015-12-30 DIAGNOSIS — R0789 Other chest pain: Secondary | ICD-10-CM

## 2015-12-30 DIAGNOSIS — R1012 Left upper quadrant pain: Secondary | ICD-10-CM

## 2015-12-30 DIAGNOSIS — Z23 Encounter for immunization: Secondary | ICD-10-CM

## 2015-12-30 DIAGNOSIS — R109 Unspecified abdominal pain: Secondary | ICD-10-CM

## 2015-12-30 LAB — POCT CBC
Granulocyte percent: 77.1 %G (ref 37–80)
HCT, POC: 31.2 % — AB (ref 43.5–53.7)
Hemoglobin: 10.5 g/dL — AB (ref 14.1–18.1)
Lymph, poc: 1.5 (ref 0.6–3.4)
MCH, POC: 26.1 pg — AB (ref 27–31.2)
MCHC: 33.7 g/dL (ref 31.8–35.4)
MCV: 77.5 fL — AB (ref 80–97)
MID (cbc): 0.5 (ref 0–0.9)
MPV: 7.8 fL (ref 0–99.8)
POC Granulocyte: 6.6 (ref 2–6.9)
POC LYMPH PERCENT: 17.6 %L (ref 10–50)
POC MID %: 5.3 %M (ref 0–12)
Platelet Count, POC: 291 10*3/uL (ref 142–424)
RBC: 4.03 M/uL — AB (ref 4.69–6.13)
RDW, POC: 16.3 %
WBC: 8.6 10*3/uL (ref 4.6–10.2)

## 2015-12-30 MED ORDER — HYDROCODONE-ACETAMINOPHEN 5-325 MG PO TABS: 1.0000 | ORAL_TABLET | Freq: Four times a day (QID) | ORAL | 0 refills | Status: DC | PRN

## 2015-12-30 NOTE — Progress Notes (Addendum)
Subjective:    Patient ID: [name], male    DOB: 1933-09-27, 82 y.o.   MRN: [id number]  HPI Chief Complaint  Patient presents with  . Follow-up    Rib pain  . Flu Vaccine  .[name] is a 82 y.o. male  Here for follow-up of multiple left rib fractures, with subsequent hemothorax requiring interventional radiology thoracentesis. Also with subsequent anemia, thought to be due to acute blood loss. See previous notes. Initial date of injury 12/09/2015, admitted September 5  through September 7, with thoracentesis on September 6 (approximately 1 L of dark bloody fluid was removed). After hospitalization pain was not controlled with tramadol, so switched to hydrocodone. Last visit with [name] September 16, symptoms were stable. Some recurrence of effusion but overall stable x-ray last visit. Ribs are still sore.  Taking hydrocodone about twice per day. This has managed the pain. No dyspnea. No fever, no cough.  Using incentive spirometer few times per day. Some discomfort in front of chest with deep breath.   Anemia. Hemoglobin 10 1 week ago. Improved from 9.7 when seen 11 days ago. Not feeling lightheaded or dizzy. Not on iron supplement. No hemoptysis.    12/23/15 XR: FINDINGS: Moderate left pleural effusion with associated atelectasis or infiltrate is stable. No right pleural effusion. Remainder of the lungs is clear. Heart, mediastinum and hila are unremarkable. Bony thorax is intact.  IMPRESSION: 1. No significant change from the most recent prior exam. 2. Persistent moderate left pleural effusion with associated atelectasis, pneumonia or combination. 3. No right pleural effusion.   Patient Active Problem List   Diagnosis Date Noted  . Fall 12/14/2015  . Elevated serum creatinine 12/14/2015  . Ileus ([place]) 12/14/2015  . Multiple fractures of ribs of left side 12/12/2015  . Blood in the urine 12/01/2013  . Cystitis, radiation 12/01/2013  . Malignant  neoplasm of prostate ([place]) 11/26/2013  . Pulsatile tinnitus 11/19/2013  . Rectal bleeding 12/23/2012  . Type II or unspecified type diabetes mellitus without mention of complication, not stated as uncontrolled   . Essential hypertension, benign   . Chronic back pain   . ED (erectile dysfunction)   . Anemia   . Hyperlipidemia   . CAD, NATIVE VESSEL 05/08/2008  . VENTRICULAR TACHYCARDIA 05/08/2008  . PREMATURE VENTRICULAR CONTRACTIONS 05/08/2008   Past Medical History:  Diagnosis Date  . Allergy   . CAD (coronary artery disease)    pci to LAD 10/09; stable CAD by cath 05/31/08; myoview 04/11/11- no ishcemia, EF 67%; echo 05/15/09- EF>55%, mod calcification of the aortic valve leaflets  . Chronic back pain   . ED (erectile dysfunction)   . Essential hypertension, benign   . Hyperlipidemia   . Multiple rib fractures    left 9th, 10th, and 11th posterior rib fractures S/P fall 12/09/2015/notes 12/12/2015  . Prostate cancer ([place])    s/p  . Type II or unspecified type diabetes mellitus without mention of complication, not stated as uncontrolled   . Ventricular tachycardia ([place])    resuscitated; monitor 05/2008; attempted T ablation 2/10- aborted due to inappropriate substrate   Past Surgical History:  Procedure Laterality Date  . CARDIAC CATHETERIZATION  05/31/08   EF 55-60%, stable lesions, medical therapy  . COLECTOMY  '80's   polyps  . CORONARY ANGIOPLASTY WITH STENT PLACEMENT  01/19/08   PCI stent to mid LAD with Promus DES 27.5x28  . PTCA  01/2008   No Known Allergies Prior to Admission medications  Medication Sig Start Date End Date Taking? Authorizing Provider  atorvastatin (LIPITOR) 10 MG tablet Take 1 tablet (10 mg total) by mouth daily. 11/16/15  Yes [name], MD  glucose blood (TRUETEST TEST) test strip USE AS DIRECTED THREE TIMES DAILY 06/21/13  Yes [name], MD  HYDROcodone-acetaminophen (NORCO/VICODIN) 5-325 MG tablet Take 1-2 tablets by mouth every 6 (six)  hours as needed for moderate pain. 12/19/15  Yes [name], MD  insulin NPH-regular Human (NOVOLIN 70/30) (70-30) 100 UNIT/ML injection Inject 7 Units into the skin 2 (two) times daily with a meal. 11/16/15  Yes [name], MD  Insulin Syringe-Needle U-100 (B-D INS SYR HALF-UNIT .3CC/31G) 31G X 5/16" 0.3 ML MISC Use daily at bedtime to inject insulin. Dx code: 250.00 06/21/13  Yes [name], MD  lisinopril-hydrochlorothiazide (PRINZIDE,ZESTORETIC) 20-12.5 MG tablet Take 1 tablet by mouth daily. 11/16/15  Yes [name], MD  metFORMIN (GLUCOPHAGE) 1000 MG tablet Take 1 tablet (1,000 mg total) by mouth 2 (two) times daily with a meal. 11/16/15  Yes [name], MD  metoprolol (LOPRESSOR) 50 MG tablet Take 1 tablet (50 mg total) by mouth 2 (two) times daily. 11/16/15  Yes [name], MD  Multiple Vitamin (MULTIVITAMIN) tablet Take 1 tablet by mouth daily.   Yes Historical Provider, MD  Omega-3 Fatty Acids (FISH OIL) 1000 MG CAPS Take 2 capsules by mouth daily.    Yes Historical Provider, MD  sildenafil (VIAGRA) 50 MG tablet Take 1 tablet (50 mg total) by mouth daily as needed for erectile dysfunction. 02/08/15  Yes Lorretta Harp, MD  traMADol (ULTRAM) 50 MG tablet Take 1-2 tablets (50-100 mg total) by mouth every 6 (six) hours as needed (Pain). 12/14/15  Yes Lisette Abu, PA-C  Vitamin D, Cholecalciferol, 1000 UNITS TABS Take 1 tablet by mouth daily.    Yes Historical Provider, MD   Social History   Social History  . Marital status: Legally Separated    Spouse name: [name]  . Number of children: 7  . Years of education: [name]   Occupational History  . RETIRED Retired   Social History Main Topics  . Smoking status: Current Some Day Smoker    Packs/day: 0.50    Years: 15.00    Types: Cigarettes    Last attempt to quit: 09/01/1990  . Smokeless tobacco: Never Used     Comment: quit 12/07/2013  . Alcohol use No  . Drug use: No  . Sexual activity: No   Other  Topics Concern  . Not on file   Social History Narrative   29 grandchildren. Single. Education: 12 th grade. Exercise: 3-4 times a week for 30 minutes working out and set ups.     Review of Systems  Constitutional: Negative for chills and fever.  Respiratory: Negative for cough and shortness of breath.   Cardiovascular: Positive for chest pain (chest wall pain on left. ).  Musculoskeletal: Positive for myalgias (L chest wall).  Skin: Positive for color change (bruising left chest wall improving. ).       Objective:   Physical Exam  Constitutional: He is oriented to person, place, and time. He appears well-developed and well-nourished.  HENT:  Head: Normocephalic and atraumatic.  Eyes: EOM are normal. Pupils are equal, round, and reactive to light.  Neck: No JVD present. Carotid bruit is not present.  Cardiovascular: Normal rate, regular rhythm and normal heart sounds.   No murmur heard. Pulmonary/Chest: Effort normal.  No accessory muscle usage. No tachypnea (No distress. speaking in normal sentences.). No respiratory distress. He has decreased breath sounds in the left middle field and the left lower field. He has no wheezes. He has no rhonchi. He has rales in the left middle field and the left lower field.  Musculoskeletal: He exhibits no edema.  Neurological: He is alert and oriented to person, place, and time.  Skin: Skin is warm and dry.  Psychiatric: He has a normal mood and affect.  Vitals reviewed.  No apparent ecchymosis or new rash of skin. Vitals:   12/30/15 0825  BP: 126/74  Pulse: 92  Resp: 18  Temp: 98.5 F (36.9 C)  TempSrc: Oral  SpO2: 97%  Weight: 219 lb (99.3 kg)  Height: 6' (1.829 m)   Results for orders placed or performed in visit on 12/30/15  POCT CBC  Result Value Ref Range   WBC 8.6 4.6 - 10.2 K/uL   Lymph, poc 1.5 0.6 - 3.4   POC LYMPH PERCENT 17.6 10 - 50 %L   MID (cbc) 0.5 0 - 0.9   POC MID % 5.3 0 - 12 %M   POC Granulocyte 6.6 2 - 6.9     Granulocyte percent 77.1 37 - 80 %G   RBC 4.03 (A) 4.69 - 6.13 M/uL   Hemoglobin 10.5 (A) 14.1 - 18.1 g/dL   HCT, POC 31.2 (A) 43.5 - 53.7 %   MCV 77.5 (A) 80 - 97 fL   MCH, POC 26.1 (A) 27 - 31.2 pg   MCHC 33.7 31.8 - 35.4 g/dL   RDW, POC 16.3 %   Platelet Count, POC 291 142 - 424 K/uL   MPV 7.8 0 - 99.8 fL   Dg Chest 2 View  Result Date: 12/30/2015 CLINICAL DATA:  Rib fracture and pleural effusion.  Re-evaluate. EXAM: CHEST  2 VIEW COMPARISON:  12/23/2015 FINDINGS: Mild thoracic spondylosis. Displaced lower left rib fractures. Midline trachea. Mild cardiomegaly. Atherosclerosis in the transverse aorta. Slight increase in moderate left-sided pleural effusion. No pneumothorax. No congestive failure. Similar left base airspace disease. IMPRESSION: Increase in moderate left-sided pleural effusion with adjacent airspace disease which could represent atelectasis or infection. Left-sided rib fractures, as before. Electronically Signed   By: Abigail Miyamoto M.D.   On: 12/30/2015 09:53   10:31 AM Discussed with trauma surgery on call. Will plan on outpatient Thoracentesis next week. Their office should be calling the patient by Monday morning. Advised patient if he has not received a phone call by 10:30 in the morning on Monday, to [facility] to determine next step.     Assessment & Plan:   [name] is a 82 y.o. male Need for influenza vaccination - Plan: Flu Vaccine QUAD 36+ mos IM  Rib fractures, left, with routine healing, subsequent encounter - Plan: DG Chest 2 View  Pleural effusion - Plan: DG Chest 2 View  Chest wall pain - Plan: DG Chest 2 View  Acute blood loss anemia - Plan: POCT CBC  Left-sided chest wall pain - Plan: HYDROcodone-acetaminophen (NORCO/VICODIN) 5-325 MG tablet  Acute left flank pain - Plan: HYDROcodone-acetaminophen (NORCO/VICODIN) 5-325 MG tablet  History of multiple left-sided rib fractures with subsequent hemothorax/pleural effusion.  Some increased recurrence of effusion, discussed with surgery as above. Denies new cough, fever, or other infectious symptoms, so doubt secondary pneumonia. Based on his exam and office it was felt he was clinically stable to have thoracentesis as an outpatient. Plan on scheduling  this early next week, and general surgery will schedule this study. Advised patient to call general surgery office Monday by lunch time if he has not heard from them at that time. If any increased shortness of breath, worsening chest pain, fevers or new cough, return here or ER sooner.  Anemia improving slowly, hemodynamically stable.  For chest wall pain, refilled hydrocodone, side effects discussed. Continue incentive spirometry at home.  Meds ordered this encounter  Medications  . HYDROcodone-acetaminophen (NORCO/VICODIN) 5-325 MG tablet    Sig: Take 1-2 tablets by mouth every 6 (six) hours as needed for moderate pain.    Dispense:  20 tablet    Refill:  0   Patient Instructions    I spoke to the trauma surgeon on call, and in review of your x-ray, she did feel that outpatient thoracentesis to remove some of that blood may be beneficial. This can likely wait till Monday, so she will plan on scheduling that for you. If you have not heard from the surgeon's office by Monday at 10:30 AM, call their office at [phone] to determine plan. Let me know if there are any questions in the meantime. Continue hydrocodone up to every 6 hours as needed for pain, but if you have any fevers, shortness of breath, increasing cough or worsening symptoms, return here or ER.    IF you received an x-ray today, you will receive an invoice from [facility]. Please contact [facility] at [phone] with questions or concerns regarding your invoice.   IF you received labwork today, you will receive an invoice from [employer]. Please contact [facility] at [phone] with questions or concerns  regarding your invoice.   Our billing staff will not be able to assist you with questions regarding bills from these companies.  You will be contacted with the lab results as soon as they are available. The fastest way to get your results is to activate your My Chart account. Instructions are located on the last page of this paperwork. If you have not heard from [place] regarding the results in 2 weeks, please contact this office.     Signed,   [name], MD [facility].  12/31/15 10:37 PM

## 2015-12-30 NOTE — Patient Instructions (Addendum)
  I spoke to the trauma surgeon on call, and in review of your x-ray, she did feel that outpatient thoracentesis to remove some of that blood may be beneficial. This can likely wait till Monday, so she will plan on scheduling that for you. If you have not heard from the surgeon's office by Monday at 10:30 AM, call their office at [phone] to determine plan. Let me know if there are any questions in the meantime. Continue hydrocodone up to every 6 hours as needed for pain, but if you have any fevers, shortness of breath, increasing cough or worsening symptoms, return here or ER.    IF you received an x-ray today, you will receive an invoice from [facility]. Please contact [facility] at [phone] with questions or concerns regarding your invoice.   IF you received labwork today, you will receive an invoice from [employer]. Please contact [facility] at [phone] with questions or concerns regarding your invoice.   Our billing staff will not be able to assist you with questions regarding bills from these companies.  You will be contacted with the lab results as soon as they are available. The fastest way to get your results is to activate your My Chart account. Instructions are located on the last page of this paperwork. If you have not heard from [place] regarding the results in 2 weeks, please contact this office.

## 2016-01-01 ENCOUNTER — Telehealth (HOSPITAL_COMMUNITY): Payer: Self-pay

## 2016-01-01 ENCOUNTER — Telehealth: Payer: Self-pay | Admitting: Emergency Medicine

## 2016-01-01 ENCOUNTER — Other Ambulatory Visit (HOSPITAL_COMMUNITY): Payer: Self-pay | Admitting: Orthopedic Surgery

## 2016-01-01 DIAGNOSIS — J90 Pleural effusion, not elsewhere classified: Secondary | ICD-10-CM

## 2016-01-01 NOTE — Telephone Encounter (Signed)
Will have [facility] set up procedure for thoracentesis.

## 2016-01-01 NOTE — Telephone Encounter (Signed)
Patient wife called in wanting to know when husband appointment @ the Trauma clinic for thoracentesis post rib fracture.  Spoke with Anne Ng at clinic, the PA will call patient to schedule appointment. Pt wife is aware. Instructed to call back if she is does not receive a phone call by the end of the day.

## 2016-01-03 ENCOUNTER — Ambulatory Visit (HOSPITAL_COMMUNITY)
Admission: RE | Admit: 2016-01-03 | Discharge: 2016-01-03 | Disposition: A | Discharge status: Home / Self Care | Payer: Commercial Managed Care - HMO | Source: Ambulatory Visit | Attending: Orthopedic Surgery | Admitting: Orthopedic Surgery

## 2016-01-03 ENCOUNTER — Encounter (HOSPITAL_COMMUNITY): Payer: Self-pay

## 2016-01-03 ENCOUNTER — Encounter (HOSPITAL_COMMUNITY): Payer: Self-pay | Admitting: Interventional Radiology

## 2016-01-03 ENCOUNTER — Ambulatory Visit (HOSPITAL_COMMUNITY)
Admission: RE | Admit: 2016-01-03 | Discharge: 2016-01-03 | Disposition: A | Discharge status: Home / Self Care | Payer: Commercial Managed Care - HMO | Source: Ambulatory Visit | Attending: General Surgery | Admitting: General Surgery

## 2016-01-03 DIAGNOSIS — X58XXXA Exposure to other specified factors, initial encounter: Secondary | ICD-10-CM | POA: Diagnosis not present

## 2016-01-03 DIAGNOSIS — R918 Other nonspecific abnormal finding of lung field: Secondary | ICD-10-CM | POA: Diagnosis not present

## 2016-01-03 DIAGNOSIS — J90 Pleural effusion, not elsewhere classified: Secondary | ICD-10-CM | POA: Diagnosis not present

## 2016-01-03 DIAGNOSIS — S2242XA Multiple fractures of ribs, left side, initial encounter for closed fracture: Secondary | ICD-10-CM | POA: Insufficient documentation

## 2016-01-03 DIAGNOSIS — Z9889 Other specified postprocedural states: Secondary | ICD-10-CM

## 2016-01-03 HISTORY — PX: IR GENERIC HISTORICAL: IMG1180011

## 2016-01-03 MED ORDER — LIDOCAINE HCL 1 % IJ SOLN
INTRAMUSCULAR | Status: AC
  Filled 2016-01-03: qty 20

## 2016-01-03 NOTE — Procedures (Signed)
Ultrasound-guided therapeutic left thoracentesis performed yielding 1.55 liters of amber colored fluid. No immediate complications. Follow-up chest x-ray pending. Carol Theys E 3:14 PM 01/03/2016

## 2016-01-05 ENCOUNTER — Ambulatory Visit (INDEPENDENT_AMBULATORY_CARE_PROVIDER_SITE_OTHER): Payer: Commercial Managed Care - HMO | Admitting: Family Medicine

## 2016-01-05 VITALS — BP 124/76 | HR 81 | Temp 98.4°F | Resp 16 | Ht 72.0 in | Wt 212.4 lb

## 2016-01-05 DIAGNOSIS — S2242XD Multiple fractures of ribs, left side, subsequent encounter for fracture with routine healing: Secondary | ICD-10-CM

## 2016-01-05 DIAGNOSIS — I498 Other specified cardiac arrhythmias: Secondary | ICD-10-CM | POA: Diagnosis not present

## 2016-01-05 DIAGNOSIS — R109 Unspecified abdominal pain: Secondary | ICD-10-CM

## 2016-01-05 DIAGNOSIS — D62 Acute posthemorrhagic anemia: Secondary | ICD-10-CM | POA: Diagnosis not present

## 2016-01-05 DIAGNOSIS — J90 Pleural effusion, not elsewhere classified: Secondary | ICD-10-CM

## 2016-01-05 DIAGNOSIS — R1012 Left upper quadrant pain: Secondary | ICD-10-CM

## 2016-01-05 DIAGNOSIS — I4949 Other premature depolarization: Secondary | ICD-10-CM

## 2016-01-05 DIAGNOSIS — R0789 Other chest pain: Secondary | ICD-10-CM | POA: Diagnosis not present

## 2016-01-05 LAB — POCT CBC
Granulocyte percent: 71.3 %G (ref 37–80)
HCT, POC: 31.1 % — AB (ref 43.5–53.7)
Hemoglobin: 10.4 g/dL — AB (ref 14.1–18.1)
Lymph, poc: 1.8 (ref 0.6–3.4)
MCH, POC: 25.2 pg — AB (ref 27–31.2)
MCHC: 33.4 g/dL (ref 31.8–35.4)
MCV: 75.6 fL — AB (ref 80–97)
MID (cbc): 0.3 (ref 0–0.9)
MPV: 7.8 fL (ref 0–99.8)
POC Granulocyte: 5.2 (ref 2–6.9)
POC LYMPH PERCENT: 24.2 %L (ref 10–50)
POC MID %: 4.5 %M (ref 0–12)
Platelet Count, POC: 303 10*3/uL (ref 142–424)
RBC: 4.12 M/uL — AB (ref 4.69–6.13)
RDW, POC: 16.3 %
WBC: 7.3 10*3/uL (ref 4.6–10.2)

## 2016-01-05 MED ORDER — HYDROCODONE-ACETAMINOPHEN 5-325 MG PO TABS: 1.0000 | ORAL_TABLET | Freq: Four times a day (QID) | ORAL | 0 refills | Status: DC | PRN

## 2016-01-05 NOTE — Patient Instructions (Addendum)
Blood count stable. Start Ferrous sulfate 325mg  once per day (iron pill).  Recheck in 4 days for repeat xray.  Sooner if any new chest pain, shortness of breath.   Continue hydrocodone if needed, incentive spirometer.   Rib Fracture A rib fracture is a break or crack in one of the bones of the ribs. The ribs are a group of long, curved bones that wrap around your chest and attach to your spine. They protect your lungs and other organs in the chest cavity. A broken or cracked rib is often painful, but most do not cause other problems. Most rib fractures heal on their own over time. However, rib fractures can be more serious if multiple ribs are broken or if broken ribs move out of place and push against other structures. CAUSES   A direct blow to the chest. For example, this could happen during contact sports, a car accident, or a fall against a hard object.  Repetitive movements with high force, such as pitching a baseball or having severe coughing spells. SYMPTOMS   Pain when you breathe in or cough.  Pain when someone presses on the injured area. DIAGNOSIS  Your caregiver will perform a physical exam. Various imaging tests may be ordered to confirm the diagnosis and to look for related injuries. These tests may include a chest X-ray, computed tomography (CT), magnetic resonance imaging (MRI), or a bone scan. TREATMENT  Rib fractures usually heal on their own in 1-3 months. The longer healing period is often associated with a continued cough or other aggravating activities. During the healing period, pain control is very important. Medication is usually given to control pain. Hospitalization or surgery may be needed for more severe injuries, such as those in which multiple ribs are broken or the ribs have moved out of place.  HOME CARE INSTRUCTIONS   Avoid strenuous activity and any activities or movements that cause pain. Be careful during activities and avoid bumping the injured  rib.  Gradually increase activity as directed by your caregiver.  Only take over-the-counter or prescription medications as directed by your caregiver. Do not take other medications without asking your caregiver first.  Apply ice to the injured area for the first 1-2 days after you have been treated or as directed by your caregiver. Applying ice helps to reduce inflammation and pain.  Put ice in a plastic bag.  Place a towel between your skin and the bag.   Leave the ice on for 15-20 minutes at a time, every 2 hours while you are awake.  Perform deep breathing as directed by your caregiver. This will help prevent pneumonia, which is a common complication of a broken rib. Your caregiver may instruct you to:  Take deep breaths several times a day.  Try to cough several times a day, holding a pillow against the injured area.  Use a device called an incentive spirometer to practice deep breathing several times a day.  Drink enough fluids to keep your urine clear or pale yellow. This will help you avoid constipation.   Do not wear a rib belt or binder. These restrict breathing, which can lead to pneumonia.  SEEK IMMEDIATE MEDICAL CARE IF:   You have a fever.   You have difficulty breathing or shortness of breath.   You develop a continual cough, or you cough up thick or bloody sputum.  You feel sick to your stomach (nausea), throw up (vomit), or have abdominal pain.   You have worsening pain  not controlled with medications.  MAKE SURE YOU:  Understand these instructions.  Will watch your condition.  Will get help right away if you are not doing well or get worse.   This information is not intended to replace advice given to you by your health care provider. Make sure you discuss any questions you have with your health care provider.   Document Released: 03/25/2005 Document Revised: 11/25/2012 Document Reviewed: 05/27/2012 Elsevier Interactive Patient Education 2016  [employer].    IF you received an x-ray today, you will receive an invoice from [facility]. Please contact [facility] at [phone] with questions or concerns regarding your invoice.   IF you received labwork today, you will receive an invoice from [employer]. Please contact [facility] at [phone] with questions or concerns regarding your invoice.   Our billing staff will not be able to assist you with questions regarding bills from these companies.  You will be contacted with the lab results as soon as they are available. The fastest way to get your results is to activate your My Chart account. Instructions are located on the last page of this paperwork. If you have not heard from [place] regarding the results in 2 weeks, please contact this office.

## 2016-01-05 NOTE — Progress Notes (Signed)
Subjective:  By signing my name below, I, [name], attest that this documentation has been prepared under the direction and in the presence of Robert Ray, MD. Electronically Signed: [name], [place]. 01/05/2016 , 8:24 AM .  Patient was seen in Room 1 .   Patient ID: [name], male    DOB: 26-Mar-1934, 82 y.o.   MRN: [id number] Chief Complaint  Patient presents with  . Follow-up    hospital follow up, per pt has to return to hospital on wednesday to have fluid removed from his rib area    HPI [name] is a 82 y.o. male Here for follow up of multiple rib fractures, followed by pleural effusion, hemothorax requiring interventional radiology for thoracentesis, seen last week with recurrence of effusion; discussed with general surgery. scheduled for outpatient repeat thoracentesis.  He had 1.55L amber fluid removed by ultrasound guided thoracentesis 2 days ago. Post procedure chest xray showed small residual effusion with associated atelectasis. He has been treating from pain fractures with hydrocodone and using incentive spirometer.   Patient states overall improvement of his upper chest symptoms and his breathing. He still has mild discomfort if he takes deep breaths. He has occasional flush sensation. He's been sleeping in his recliner. He denies chest pain, lightheadedness or dizziness. He's been using his incentive spirometer 4~5 times a day, and hydrocodone tid. He was prescribed 20 hydrocodone on the 23rd, with 6 pills left.   Anemia Improving as of last visit, up to 10.5   Patient Active Problem List   Diagnosis Date Noted  . Fall 12/14/2015  . Elevated serum creatinine 12/14/2015  . Ileus ([place]) 12/14/2015  . Multiple fractures of ribs of left side 12/12/2015  . Acosta in the urine 12/01/2013  . Cystitis, radiation 12/01/2013  . Malignant neoplasm of prostate ([place]) 11/26/2013  . Pulsatile tinnitus 11/19/2013  . Rectal bleeding 12/23/2012  . Type II or  unspecified type diabetes mellitus without mention of complication, not stated as uncontrolled   . Essential hypertension, benign   . Chronic back pain   . ED (erectile dysfunction)   . Anemia   . Hyperlipidemia   . CAD, NATIVE VESSEL 05/08/2008  . VENTRICULAR TACHYCARDIA 05/08/2008  . PREMATURE VENTRICULAR CONTRACTIONS 05/08/2008   Past Medical History:  Diagnosis Date  . Allergy   . CAD (coronary artery disease)    pci to LAD 10/09; stable CAD by cath 05/31/08; myoview 04/11/11- no ishcemia, EF 67%; echo 05/15/09- EF>55%, mod calcification of the aortic valve leaflets  . Chronic back pain   . ED (erectile dysfunction)   . Essential hypertension, benign   . Hyperlipidemia   . Multiple rib fractures    left 9th, 10th, and 11th posterior rib fractures S/P fall 12/09/2015/notes 12/12/2015  . Prostate cancer ([place])    s/p  . Type II or unspecified type diabetes mellitus without mention of complication, not stated as uncontrolled   . Ventricular tachycardia ([place])    resuscitated; monitor 05/2008; attempted T ablation 2/10- aborted due to inappropriate substrate   Past Surgical History:  Procedure Laterality Date  . CARDIAC CATHETERIZATION  05/31/08   EF 55-60%, stable lesions, medical therapy  . COLECTOMY  '80's   polyps  . CORONARY ANGIOPLASTY WITH STENT PLACEMENT  01/19/08   PCI stent to mid LAD with Promus DES 27.5x28  . IR GENERIC HISTORICAL  01/03/2016   IR THORACENTESIS ASP PLEURAL SPACE W/IMG GUIDE 01/03/2016 [facility]  . PTCA  01/2008  No Known Allergies Prior to Admission medications   Medication Sig Start Date End Date Taking? Authorizing Provider  atorvastatin (LIPITOR) 10 MG tablet Take 1 tablet (10 mg total) by mouth daily. 11/16/15  Yes [name], MD  glucose Acosta (TRUETEST TEST) test strip USE AS DIRECTED THREE TIMES DAILY 06/21/13  Yes [name], MD  HYDROcodone-acetaminophen (NORCO/VICODIN) 5-325 MG tablet Take 1-2 tablets by mouth every 6 (six) hours as  needed for moderate pain. 12/30/15  Yes [name], MD  insulin NPH-regular Human (NOVOLIN 70/30) (70-30) 100 UNIT/ML injection Inject 7 Units into the skin 2 (two) times daily with a meal. 11/16/15  Yes [name], MD  Insulin Syringe-Needle U-100 (B-D INS SYR HALF-UNIT .3CC/31G) 31G X 5/16" 0.3 ML MISC Use daily at bedtime to inject insulin. Dx code: 250.00 06/21/13  Yes [name], MD  lisinopril-hydrochlorothiazide (PRINZIDE,ZESTORETIC) 20-12.5 MG tablet Take 1 tablet by mouth daily. 11/16/15  Yes [name], MD  metFORMIN (GLUCOPHAGE) 1000 MG tablet Take 1 tablet (1,000 mg total) by mouth 2 (two) times daily with a meal. 11/16/15  Yes [name], MD  metoprolol (LOPRESSOR) 50 MG tablet Take 1 tablet (50 mg total) by mouth 2 (two) times daily. 11/16/15  Yes [name], MD  Multiple Vitamin (MULTIVITAMIN) tablet Take 1 tablet by mouth daily.   Yes Historical Provider, MD  Omega-3 Fatty Acids (FISH OIL) 1000 MG CAPS Take 2 capsules by mouth daily.    Yes Historical Provider, MD  sildenafil (VIAGRA) 50 MG tablet Take 1 tablet (50 mg total) by mouth daily as needed for erectile dysfunction. 02/08/15  Yes Lorretta Harp, MD  traMADol (ULTRAM) 50 MG tablet Take 1-2 tablets (50-100 mg total) by mouth every 6 (six) hours as needed (Pain). 12/14/15  Yes Lisette Abu, PA-C  Vitamin D, Cholecalciferol, 1000 UNITS TABS Take 1 tablet by mouth daily.    Yes Historical Provider, MD   Social History   Social History  . Marital status: Legally Separated    Spouse name: [name]  . Number of children: 7  . Years of education: [name]   Occupational History  . RETIRED Retired   Social History Main Topics  . Smoking status: Current Some Day Smoker    Packs/day: 0.50    Years: 15.00    Types: Cigarettes    Last attempt to quit: 09/01/1990  . Smokeless tobacco: Never Used     Comment: quit 12/07/2013  . Alcohol use No  . Drug use: No  . Sexual activity: No   Other Topics  Concern  . Not on file   Social History Narrative   29 grandchildren. Single. Education: 12 th grade. Exercise: 3-4 times a week for 30 minutes working out and set ups.   Review of Systems  Constitutional: Negative for chills, fatigue and fever.  Respiratory: Negative for cough, shortness of breath and wheezing.   Cardiovascular: Positive for chest pain (chest discomfort) and palpitations.  Gastrointestinal: Negative for constipation, diarrhea, nausea and vomiting.  Neurological: Negative for dizziness and light-headedness.       Objective:   Physical Exam  Constitutional: He is oriented to person, place, and time. He appears well-developed and well-nourished. No distress.  HENT:  Head: Normocephalic and atraumatic.  Eyes: EOM are normal. Pupils are equal, round, and reactive to light.  Neck: Neck supple.  Cardiovascular: Normal rate.   Ectopic beats  Pulmonary/Chest: Effort normal. No respiratory distress.  Breath sounds heard  bilaterally, minimally decreased in the left lower lobe with some crackles, healing wound on the left posterior back from previous procedure, band-aid was removed; tender left lateral chest wall, no new bruising, skin intact  Musculoskeletal: Normal range of motion.  Neurological: He is alert and oriented to person, place, and time.  Skin: Skin is warm and dry.  Psychiatric: He has a normal mood and affect. His behavior is normal.  Nursing note and vitals reviewed.   Vitals:   01/05/16 0802  BP: 124/76  Pulse: 81  Resp: 16  Temp: 98.4 F (36.9 C)  TempSrc: Oral  SpO2: 97%  Weight: 212 lb 6.4 oz (96.3 kg)  Height: 6' (1.829 m)   EKG: sinus rhythm with rate variation, no significant changes from previous readings  Results for orders placed or performed in visit on 01/05/16  POCT CBC  Result Value Ref Range   WBC 7.3 4.6 - 10.2 K/uL   Lymph, poc 1.8 0.6 - 3.4   POC LYMPH PERCENT 24.2 10 - 50 %L   MID (cbc) 0.3 0 - 0.9   POC MID % 4.5 0 - 12  %M   POC Granulocyte 5.2 2 - 6.9   Granulocyte percent 71.3 37 - 80 %G   RBC 4.12 (A) 4.69 - 6.13 M/uL   Hemoglobin 10.4 (A) 14.1 - 18.1 g/dL   HCT, POC 31.1 (A) 43.5 - 53.7 %   MCV 75.6 (A) 80 - 97 fL   MCH, POC 25.2 (A) 27 - 31.2 pg   MCHC 33.4 31.8 - 35.4 g/dL   RDW, POC 16.3 %   Platelet Count, POC 303 142 - 424 K/uL   MPV 7.8 0 - 99.8 fL       Assessment & Plan:    [name] is a 82 y.o. male Fracture of multiple ribs, left, with routine healing, subsequent encounter  Pleural effusion  Ectopic beat - Plan: EKG 12-Lead  Chest wall pain - Plan: EKG 12-Lead  Acute Acosta loss anemia - Plan: POCT CBC  Left-sided chest wall pain - Plan: HYDROcodone-acetaminophen (NORCO/VICODIN) 5-325 MG tablet  Acute left flank pain - Plan: HYDROcodone-acetaminophen (NORCO/VICODIN) 5-325 MG tablet   History of multiple left rib fractures with subsequent thoracentesis for pleural effusion/hemothorax. Recent pleural effusion removed via repeat thoracentesis. Overall stable symptoms, anemia stable. Continue hydrocodone as needed for left rib pain, incentive spirometry, iron QD. recheck in 4 days for repeat chest x-Acosta, sooner if worse. RTC/ER precautions   Meds ordered this encounter  Medications  . HYDROcodone-acetaminophen (NORCO/VICODIN) 5-325 MG tablet    Sig: Take 1-2 tablets by mouth every 6 (six) hours as needed for moderate pain.    Dispense:  20 tablet    Refill:  0   Patient Instructions    Acosta count stable. Start Ferrous sulfate 325mg  once per day (iron pill).  Recheck in 4 days for repeat xray.  Sooner if any new chest pain, shortness of breath.   Continue hydrocodone if needed, incentive spirometer.   Rib Fracture A rib fracture is a break or crack in one of the bones of the ribs. The ribs are a group of long, curved bones that wrap around your chest and attach to your spine. They protect your lungs and other organs in the chest cavity. A broken or cracked rib is  often painful, but most do not cause other problems. Most rib fractures heal on their own over time. However, rib fractures can be more serious if multiple  ribs are broken or if broken ribs move out of place and push against other structures. CAUSES   A direct blow to the chest. For example, this could happen during contact sports, a car accident, or a fall against a hard object.  Repetitive movements with high force, such as pitching a baseball or having severe coughing spells. SYMPTOMS   Pain when you breathe in or cough.  Pain when someone presses on the injured area. DIAGNOSIS  Your caregiver will perform a physical exam. Various imaging tests may be ordered to confirm the diagnosis and to look for related injuries. These tests may include a chest X-Acosta, computed tomography (CT), magnetic resonance imaging (MRI), or a bone scan. TREATMENT  Rib fractures usually heal on their own in 1-3 months. The longer healing period is often associated with a continued cough or other aggravating activities. During the healing period, pain control is very important. Medication is usually given to control pain. Hospitalization or surgery may be needed for more severe injuries, such as those in which multiple ribs are broken or the ribs have moved out of place.  HOME CARE INSTRUCTIONS   Avoid strenuous activity and any activities or movements that cause pain. Be careful during activities and avoid bumping the injured rib.  Gradually increase activity as directed by your caregiver.  Only take over-the-counter or prescription medications as directed by your caregiver. Do not take other medications without asking your caregiver first.  Apply ice to the injured area for the first 1-2 days after you have been treated or as directed by your caregiver. Applying ice helps to reduce inflammation and pain.  Put ice in a plastic bag.  Place a towel between your skin and the bag.   Leave the ice on for 15-20  minutes at a time, every 2 hours while you are awake.  Perform deep breathing as directed by your caregiver. This will help prevent pneumonia, which is a common complication of a broken rib. Your caregiver may instruct you to:  Take deep breaths several times a day.  Try to cough several times a day, holding a pillow against the injured area.  Use a device called an incentive spirometer to practice deep breathing several times a day.  Drink enough fluids to keep your urine clear or pale yellow. This will help you avoid constipation.   Do not wear a rib belt or binder. These restrict breathing, which can lead to pneumonia.  SEEK IMMEDIATE MEDICAL CARE IF:   You have a fever.   You have difficulty breathing or shortness of breath.   You develop a continual cough, or you cough up thick or bloody sputum.  You feel sick to your stomach (nausea), throw up (vomit), or have abdominal pain.   You have worsening pain not controlled with medications.  MAKE SURE YOU:  Understand these instructions.  Will watch your condition.  Will get help right away if you are not doing well or get worse.   This information is not intended to replace advice given to you by your health care provider. Make sure you discuss any questions you have with your health care provider.   Document Released: 03/25/2005 Document Revised: 11/25/2012 Document Reviewed: 05/27/2012 Elsevier Interactive Patient Education 2016 [employer].    IF you received an x-Acosta today, you will receive an invoice from [facility]. Please contact [facility] at [phone] with questions or concerns regarding your invoice.   IF you received labwork today, you will receive  an Pharmacologist from [employer]. Please contact [facility] at [phone] with questions or concerns regarding your invoice.   Our billing staff will not be able to assist you with questions regarding bills from these  companies.  You will be contacted with the lab results as soon as they are available. The fastest way to get your results is to activate your My Chart account. Instructions are located on the last page of this paperwork. If you have not heard from [place] regarding the results in 2 weeks, please contact this office.        I personally performed the services described in this documentation, which was scribed in my presence. The recorded information has been reviewed and considered, and addended by me as needed.   Signed,   Robert Ray, MD [facility].  01/05/16 8:59 AM

## 2016-01-09 ENCOUNTER — Encounter: Payer: Self-pay | Admitting: Family Medicine

## 2016-01-09 ENCOUNTER — Ambulatory Visit (INDEPENDENT_AMBULATORY_CARE_PROVIDER_SITE_OTHER): Payer: Commercial Managed Care - HMO | Admitting: Family Medicine

## 2016-01-09 ENCOUNTER — Ambulatory Visit (INDEPENDENT_AMBULATORY_CARE_PROVIDER_SITE_OTHER): Payer: Commercial Managed Care - HMO

## 2016-01-09 VITALS — BP 122/72 | HR 83 | Temp 98.2°F | Resp 17 | Ht 70.5 in | Wt 214.0 lb

## 2016-01-09 DIAGNOSIS — J90 Pleural effusion, not elsewhere classified: Secondary | ICD-10-CM | POA: Diagnosis not present

## 2016-01-09 DIAGNOSIS — D62 Acute posthemorrhagic anemia: Secondary | ICD-10-CM

## 2016-01-09 DIAGNOSIS — S2242XD Multiple fractures of ribs, left side, subsequent encounter for fracture with routine healing: Secondary | ICD-10-CM

## 2016-01-09 DIAGNOSIS — N289 Disorder of kidney and ureter, unspecified: Secondary | ICD-10-CM | POA: Diagnosis not present

## 2016-01-09 DIAGNOSIS — S2232XA Fracture of one rib, left side, initial encounter for closed fracture: Secondary | ICD-10-CM | POA: Diagnosis not present

## 2016-01-09 LAB — POCT CBC
Granulocyte percent: 75.7 %G (ref 37–80)
HCT, POC: 35 % — AB (ref 43.5–53.7)
Hemoglobin: 11.4 g/dL — AB (ref 14.1–18.1)
Lymph, poc: 1.6 (ref 0.6–3.4)
MCH, POC: 24.8 pg — AB (ref 27–31.2)
MCHC: 32.7 g/dL (ref 31.8–35.4)
MCV: 76 fL — AB (ref 80–97)
MID (cbc): 0.4 (ref 0–0.9)
MPV: 8.1 fL (ref 0–99.8)
POC Granulocyte: 6.3 (ref 2–6.9)
POC LYMPH PERCENT: 19.2 %L (ref 10–50)
POC MID %: 5.1 %M (ref 0–12)
Platelet Count, POC: 309 10*3/uL (ref 142–424)
RBC: 4.6 M/uL — AB (ref 4.69–6.13)
RDW, POC: 15.9 %
WBC: 8.3 10*3/uL (ref 4.6–10.2)

## 2016-01-09 LAB — BASIC METABOLIC PANEL
BUN: 18 mg/dL (ref 7–25)
CO2: 20 mmol/L (ref 20–31)
Calcium: 8.8 mg/dL (ref 8.6–10.3)
Chloride: 105 mmol/L (ref 98–110)
Creat: 1.36 mg/dL — ABNORMAL HIGH (ref 0.70–1.18)
Glucose, Bld: 110 mg/dL — ABNORMAL HIGH (ref 65–99)
Potassium: 4.7 mmol/L (ref 3.5–5.3)
Sodium: 139 mmol/L (ref 135–146)

## 2016-01-09 NOTE — Patient Instructions (Addendum)
Fluid in your lung is still there, but stable. Make sure you continue to use incentive spirometer, use pain medication as  needed for left rib pain (and to allow you to take deep breaths), and follow-up for repeat x-ray Wednesday. Call tomorrow morning for that appointment. Return sooner if any shortness of breath, fever, or increasing chest pain.  I will check your kidney function and blood count again to make sure those are stable, but continue the iron once a day for now.    Rib Fracture A rib fracture is a break or crack in one of the bones of the ribs. The ribs are a group of long, curved bones that wrap around your chest and attach to your spine. They protect your lungs and other organs in the chest cavity. A broken or cracked rib is often painful, but most do not cause other problems. Most rib fractures heal on their own over time. However, rib fractures can be more serious if multiple ribs are broken or if broken ribs move out of place and push against other structures. CAUSES   A direct blow to the chest. For example, this could happen during contact sports, a car accident, or a fall against a hard object.  Repetitive movements with high force, such as pitching a baseball or having severe coughing spells. SYMPTOMS   Pain when you breathe in or cough.  Pain when someone presses on the injured area. DIAGNOSIS  Your caregiver will perform a physical exam. Various imaging tests may be ordered to confirm the diagnosis and to look for related injuries. These tests may include a chest X-ray, computed tomography (CT), magnetic resonance imaging (MRI), or a bone scan. TREATMENT  Rib fractures usually heal on their own in 1-3 months. The longer healing period is often associated with a continued cough or other aggravating activities. During the healing period, pain control is very important. Medication is usually given to control pain. Hospitalization or surgery may be needed for more severe  injuries, such as those in which multiple ribs are broken or the ribs have moved out of place.  HOME CARE INSTRUCTIONS   Avoid strenuous activity and any activities or movements that cause pain. Be careful during activities and avoid bumping the injured rib.  Gradually increase activity as directed by your caregiver.  Only take over-the-counter or prescription medications as directed by your caregiver. Do not take other medications without asking your caregiver first.  Apply ice to the injured area for the first 1-2 days after you have been treated or as directed by your caregiver. Applying ice helps to reduce inflammation and pain.  Put ice in a plastic bag.  Place a towel between your skin and the bag.   Leave the ice on for 15-20 minutes at a time, every 2 hours while you are awake.  Perform deep breathing as directed by your caregiver. This will help prevent pneumonia, which is a common complication of a broken rib. Your caregiver may instruct you to:  Take deep breaths several times a day.  Try to cough several times a day, holding a pillow against the injured area.  Use a device called an incentive spirometer to practice deep breathing several times a day.  Drink enough fluids to keep your urine clear or pale yellow. This will help you avoid constipation.   Do not wear a rib belt or binder. These restrict breathing, which can lead to pneumonia.  SEEK IMMEDIATE MEDICAL CARE IF:   You have  a fever.   You have difficulty breathing or shortness of breath.   You develop a continual cough, or you cough up thick or bloody sputum.  You feel sick to your stomach (nausea), throw up (vomit), or have abdominal pain.   You have worsening pain not controlled with medications.  MAKE SURE YOU:  Understand these instructions.  Will watch your condition.  Will get help right away if you are not doing well or get worse.   This information is not intended to replace advice  given to you by your health care provider. Make sure you discuss any questions you have with your health care provider.   Document Released: 03/25/2005 Document Revised: 11/25/2012 Document Reviewed: 05/27/2012 Elsevier Interactive Patient Education 2016 Else[employer] Inc[employer]  Pleural Effusion A pleural effusion is an abnormal buildup of fluid in the layers of tissue between your lungs and the inside of your chest (pleural space). These two layers of tissue that line both your lungs and the inside of your chest are called pleura. Usually, there is no air in the space between the pleura, only a thin layer of fluid. If left untreated, a large amount of fluid can build up and cause the lung to collapse. A pleural effusion is usually caused by another disease that requires treatment. The two main types of pleural effusion are:  Transudative pleural effusion. This happens when fluid leaks into the pleural space because of a low protein count in your blood or high blood pressure in your vessels. Heart failure often causes this.  Exudative infusion. This occurs when fluid collects in the pleural space from blocked blood vessels or lymph vessels. Some lung diseases, injuries, and cancers can cause this type of effusion. CAUSES Pleural effusion can be caused by:  Heart failure.  A blood clot in the lung (pulmonary embolism).  Pneumonia.  Cancer.  Liver failure (cirrhosis).  Kidney disease.  Complications from surgery, such as from open heart surgery. SIGNS AND SYMPTOMS In some cases, pleural effusion may cause no symptoms. Symptoms can include:  Shortness of breath, especially when lying down.  Chest pain, often worse when taking a deep breath.  Fever.  Dry cough that is lasting (chronic).  Hiccups.  Rapid breathing. An underlying condition that is causing the pleural effusion (such as heart failure, pneumonia, blood clots, tuberculosis, or cancer) may also cause additional  symptoms. DIAGNOSIS Your health care provider may suspect pleural effusion based on your symptoms and medical history. Your health care provider will also do a physical exam and a chest X-ray. If the X-ray shows there is fluid in your chest, you may need to have this fluid removed using a needle (thoracentesis) so it can be tested. You may also have:  Imaging studies of the chest, such as:  Ultrasound.  CT scan.  Blood tests for kidney and liver function. TREATMENT Treatment depends on the cause of the pleural effusion. Treatment may include:  Taking antibiotic medicines to clear up an infection that is causing the pleural effusion.  Placing a tube in the chest to drain the effusion (tube thoracostomy). This procedure is often used when there is an infection in the fluid.  Surgery to remove the fibrous outer layer of tissue from the pleural space (decortication).  Thoracentesis, which can improve cough and shortness of breath.  A procedure to put medicine into the chest cavity to seal the pleural space to prevent fluid buildup (pleurodesis).  Chemotherapy and radiation therapy. These may be required in the  case of cancerous (malignant) pleural effusion. HOME CARE INSTRUCTIONS  Take medicines only as directed by your health care provider.  Keep track of how long you can gently exercise before you get short of breath. Try simply walking at first.  Do not use any tobacco products, including cigarettes, chewing tobacco, or electronic cigarettes. If you need help quitting, ask your health care provider.  Keep all follow-up visits as directed by your health care provider. This is important. SEEK MEDICAL CARE IF:  The amount of time that you are able to exercise decreases or does not improve with time.  You have pain or signs of infection at the puncture site if you had thoracentesis. Watch for:  Drainage.  Redness.  Swelling.  You have a fever. SEEK IMMEDIATE MEDICAL CARE  IF:  You are short of breath.  You develop chest pain.  You develop a new cough. MAKE SURE YOU:  Understand these instructions.  Will watch your condition.  Will get help right away if you are not doing well or get worse.   This information is not intended to replace advice given to you by your health care provider. Make sure you discuss any questions you have with your health care provider.   Document Released: 03/25/2005 Document Revised: 04/15/2014 Document Reviewed: 08/18/2013 Elsevier Interactive Patient Education 2016 [employer].     IF you received an x-ray today, you will receive an invoice from [facility]. Please contact [facility] at [phone] with questions or concerns regarding your invoice.   IF you received labwork today, you will receive an invoice from [employer]. Please contact [facility] at [phone] with questions or concerns regarding your invoice.   Our billing staff will not be able to assist you with questions regarding bills from these companies.  You will be contacted with the lab results as soon as they are available. The fastest way to get your results is to activate your My Chart account. Instructions are located on the last page of this paperwork. If you have not heard from [place] regarding the results in 2 weeks, please contact this office.

## 2016-01-09 NOTE — Progress Notes (Addendum)
Subjective:  By signing my name below, I, [name], attest that this documentation has been prepared under the direction and in the presence of [name], MD. Electronically Signed: [name], Arthur. 01/09/2016 , 10:32 AM .  Patient was seen in Room 10 .   Patient ID: [name], male    DOB: 07/26/1933, 82 y.o.   MRN: [id number] Chief Complaint  Patient presents with  . Follow-up    rib injury    HPI [name] is a 82 y.o. male   Rib fracture Here for follow up of multiple rib fractures with subsequent pleural effusion/hemothorax. He was initially admitted to trauma service and had 1 liter of bloody fluid removed by interventional radiology, which was on Sept 6th. He then had re-accumulation of fluid with outpatient US guided thoracentesis on Sept 27th with 1.55L amber fluid removed. He's been using hydrocodone as needed for rib fracture pain and incentive spirometer at home.   Patient states he still has some soreness with deep breathing. He also still has some rib pain over his left side, as well shortness of breath with activity. He denies fever or cough. He's been taking hydrocodone 4 tablets qd. He denies taking advil or aleve.   Anemia He had secondary blood loss to rib fracture, was stable and improving at last visit at 10.4. He was advised to take iron each day.   Patient's been taking ferrous sulfate with 65mg  OTC iron supplement. He noticed darkening in his stool. He denies blood in his stool.   Renal insufficiency  He had elevation in his creatinine to 1.6 in the hospital. It was improved down to 1.33 on Sept 12th. Recommended increase fluid intake.    Patient Active Problem List   Diagnosis Date Noted  . Fall 12/14/2015  . Elevated serum creatinine 12/14/2015  . Ileus ([place]) 12/14/2015  . Multiple fractures of ribs of left side 12/12/2015  . Blood in the urine 12/01/2013  . Cystitis, radiation 12/01/2013  . Malignant neoplasm of prostate  ([place]) 11/26/2013  . Pulsatile tinnitus 11/19/2013  . Rectal bleeding 12/23/2012  . Type II or unspecified type diabetes mellitus without mention of complication, not stated as uncontrolled   . Essential hypertension, benign   . Chronic back pain   . ED (erectile dysfunction)   . Anemia   . Hyperlipidemia   . CAD, NATIVE VESSEL 05/08/2008  . VENTRICULAR TACHYCARDIA 05/08/2008  . PREMATURE VENTRICULAR CONTRACTIONS 05/08/2008   Past Medical History:  Diagnosis Date  . Allergy   . CAD (coronary artery disease)    pci to LAD 10/09; stable CAD by cath 05/31/08; myoview 04/11/11- no ishcemia, EF 67%; echo 05/15/09- EF>55%, mod calcification of the aortic valve leaflets  . Chronic back pain   . ED (erectile dysfunction)   . Essential hypertension, benign   . Hyperlipidemia   . Multiple rib fractures    left 9th, 10th, and 11th posterior rib fractures S/P fall 12/09/2015/notes 12/12/2015  . Prostate cancer ([place])    s/p  . Type II or unspecified type diabetes mellitus without mention of complication, not stated as uncontrolled   . Ventricular tachycardia ([place])    resuscitated; monitor 05/2008; attempted T ablation 2/10- aborted due to inappropriate substrate   Past Surgical History:  Procedure Laterality Date  . CARDIAC CATHETERIZATION  05/31/08   EF 55-60%, stable lesions, medical therapy  . COLECTOMY  '80's   polyps  . CORONARY ANGIOPLASTY WITH STENT PLACEMENT  01/19/08  PCI stent to mid LAD with Promus DES 27.5x28  . IR GENERIC HISTORICAL  01/03/2016   IR THORACENTESIS ASP PLEURAL SPACE W/IMG GUIDE 01/03/2016 [facility]  . PTCA  01/2008   No Known Allergies Prior to Admission medications   Medication Sig Start Date End Date Taking? Authorizing Provider  atorvastatin (LIPITOR) 10 MG tablet Take 1 tablet (10 mg total) by mouth daily. 11/16/15  Yes [name], MD  ferrous sulfate 325 (65 FE) MG tablet Take 325 mg by mouth daily with breakfast.   Yes Historical Provider, MD  glucose  blood (TRUETEST TEST) test strip USE AS DIRECTED THREE TIMES DAILY 06/21/13  Yes [name], MD  HYDROcodone-acetaminophen (NORCO/VICODIN) 5-325 MG tablet Take 1-2 tablets by mouth every 6 (six) hours as needed for moderate pain. 01/05/16  Yes [name], MD  insulin NPH-regular Human (NOVOLIN 70/30) (70-30) 100 UNIT/ML injection Inject 7 Units into the skin 2 (two) times daily with a meal. 11/16/15  Yes [name], MD  Insulin Syringe-Needle U-100 (B-D INS SYR HALF-UNIT .3CC/31G) 31G X 5/16" 0.3 ML MISC Use daily at bedtime to inject insulin. Dx code: 250.00 06/21/13  Yes [name], MD  lisinopril-hydrochlorothiazide (PRINZIDE,ZESTORETIC) 20-12.5 MG tablet Take 1 tablet by mouth daily. 11/16/15  Yes [name], MD  metFORMIN (GLUCOPHAGE) 1000 MG tablet Take 1 tablet (1,000 mg total) by mouth 2 (two) times daily with a meal. 11/16/15  Yes [name], MD  metoprolol (LOPRESSOR) 50 MG tablet Take 1 tablet (50 mg total) by mouth 2 (two) times daily. 11/16/15  Yes [name], MD  Multiple Vitamin (MULTIVITAMIN) tablet Take 1 tablet by mouth daily.   Yes Historical Provider, MD  Omega-3 Fatty Acids (FISH OIL) 1000 MG CAPS Take 2 capsules by mouth daily.    Yes Historical Provider, MD  sildenafil (VIAGRA) 50 MG tablet Take 1 tablet (50 mg total) by mouth daily as needed for erectile dysfunction. 02/08/15  Yes Lorretta Harp, MD  traMADol (ULTRAM) 50 MG tablet Take 1-2 tablets (50-100 mg total) by mouth every 6 (six) hours as needed (Pain). 12/14/15  Yes Lisette Abu, PA-C  Vitamin D, Cholecalciferol, 1000 UNITS TABS Take 1 tablet by mouth daily.    Yes Historical Provider, MD   Social History   Social History  . Marital status: Legally Separated    Spouse name: [name]  . Number of children: 7  . Years of education: [name]   Occupational History  . RETIRED Retired   Social History Main Topics  . Smoking status: Current Some Day Smoker    Packs/day: 0.50    Years:  15.00    Types: Cigarettes    Last attempt to quit: 09/01/1990  . Smokeless tobacco: Never Used     Comment: quit 12/07/2013  . Alcohol use No  . Drug use: No  . Sexual activity: No   Other Topics Concern  . Not on file   Social History Narrative   29 grandchildren. Single. Education: 12 th grade. Exercise: 3-4 times a week for 30 minutes working out and set ups.   Review of Systems  Constitutional: Negative for fatigue and unexpected weight change.  Eyes: Negative for visual disturbance.  Respiratory: Positive for shortness of breath (with activity). Negative for cough and chest tightness.   Cardiovascular: Positive for chest pain (soreness). Negative for palpitations and leg swelling.  Gastrointestinal: Negative for abdominal pain and blood in stool.  Neurological: Negative for dizziness,  light-headedness and headaches.       Objective:   Physical Exam  Constitutional: He is oriented to person, place, and time. He appears well-developed and well-nourished. No distress.  HENT:  Head: Normocephalic and atraumatic.  Eyes: EOM are normal. Pupils are equal, round, and reactive to light.  Neck: Neck supple.  Cardiovascular: Normal rate, regular rhythm and normal heart sounds.  Exam reveals no gallop and no friction rub.   No murmur heard. Pulmonary/Chest: Effort normal. No respiratory distress. He has decreased breath sounds. He has rales.  Some crackles and coarse breath sounds, slight decreased air movement L>R Chest wall: slight tenderness along lateral left chest wall, at the posterior axillar line, but skin intact, no bruising, no sub-q emphysema  Abdominal: Soft. Bowel sounds are normal. He exhibits no distension. There is no tenderness.  Musculoskeletal: Normal range of motion.  Neurological: He is alert and oriented to person, place, and time.  Skin: Skin is warm and dry.  Psychiatric: He has a normal mood and affect. His behavior is normal.  Nursing note and vitals  reviewed.   Vitals:   01/09/16 0918  BP: 122/72  Pulse: 83  Resp: 17  Temp: 98.2 F (36.8 C)  TempSrc: Oral  SpO2: 99%  Weight: 214 lb (97.1 kg)  Height: 5' 10.5" (1.791 m)   Dg Chest 2 View  Result Date: 01/09/2016 CLINICAL DATA:  Follow-up rib fractures. EXAM: CHEST  2 VIEW COMPARISON:  01/03/2016. FINDINGS: Mediastinum hilar structures are normal. Heart size stable. Bilateral multifocal of subsegmental atelectasis again noted. Small left pleural effusion. Findings are stable stable from prior exam. Left rib fractures are stable. IMPRESSION: Bilateral multifocal areas of subsegmental atelectasis and small left pleural effusion again noted. These findings are stable from prior exam. Stable left rib fractures. No pneumothorax. Electronically Signed   By: Marcello Moores  Register   On: 01/09/2016 11:08   Results for orders placed or performed in visit on 01/09/16  POCT CBC  Result Value Ref Range   WBC 8.3 4.6 - 10.2 K/uL   Lymph, poc 1.6 0.6 - 3.4   POC LYMPH PERCENT 19.2 10 - 50 %L   MID (cbc) 0.4 0 - 0.9   POC MID % 5.1 0 - 12 %M   POC Granulocyte 6.3 2 - 6.9   Granulocyte percent 75.7 37 - 80 %G   RBC 4.60 (A) 4.69 - 6.13 M/uL   Hemoglobin 11.4 (A) 14.1 - 18.1 g/dL   HCT, POC 35.0 (A) 43.5 - 53.7 %   MCV 76.0 (A) 80 - 97 fL   MCH, POC 24.8 (A) 27 - 31.2 pg   MCHC 32.7 31.8 - 35.4 g/dL   RDW, POC 15.9 %   Platelet Count, POC 309 142 - 424 K/uL   MPV 8.1 0 - 99.8 fL       Assessment & Plan:    DIONTRE HARPS is a 82 y.o. male Closed fracture of multiple ribs of left side with routine healing, subsequent encounter - Plan: DG Chest 2 View  - Pain is improving. Tolerating hydrocodone as needed. Discussed weaning that medication as his pain improves, but okay to continue to allow him to perform incentive spirometry to lessen atelectasis seen on chest x-ray. RTC precautions if worse, otherwise recheck in 8 days  Acute blood loss anemia - Plan: POCT CBC  - Continue iron,  improving.   Pleural effusion - Plan: DG Chest 2 View  - Stable on chest x-ray today. Repeat chest  x-ray in 8 days, sooner if shortness breath, chest pain or new/worsening symptoms.  Renal insufficiency - Plan: Basic metabolic panel  - maintain hydration, avoid NSAIDs. Repeat BMP pending.   Meds ordered this encounter  Medications  . ferrous sulfate 325 (65 FE) MG tablet    Sig: Take 325 mg by mouth daily with breakfast.   Patient Instructions    Fluid in your lung is still there, but stable. Make sure you continue to use incentive spirometer, use pain medication as  needed for left rib pain (and to allow you to take deep breaths), and follow-up for repeat x-ray Wednesday. Call tomorrow morning for that appointment. Return sooner if any shortness of breath, fever, or increasing chest pain.  I will check your kidney function and blood count again to make sure those are stable, but continue the iron once a day for now.    Rib Fracture A rib fracture is a break or crack in one of the bones of the ribs. The ribs are a group of long, curved bones that wrap around your chest and attach to your spine. They protect your lungs and other organs in the chest cavity. A broken or cracked rib is often painful, but most do not cause other problems. Most rib fractures heal on their own over time. However, rib fractures can be more serious if multiple ribs are broken or if broken ribs move out of place and push against other structures. CAUSES   A direct blow to the chest. For example, this could happen during contact sports, a car accident, or a fall against a hard object.  Repetitive movements with high force, such as pitching a baseball or having severe coughing spells. SYMPTOMS   Pain when you breathe in or cough.  Pain when someone presses on the injured area. DIAGNOSIS  Your caregiver will perform a physical exam. Various imaging tests may be ordered to confirm the diagnosis and to look for  related injuries. These tests may include a chest X-ray, computed tomography (CT), magnetic resonance imaging (MRI), or a bone scan. TREATMENT  Rib fractures usually heal on their own in 1-3 months. The longer healing period is often associated with a continued cough or other aggravating activities. During the healing period, pain control is very important. Medication is usually given to control pain. Hospitalization or surgery may be needed for more severe injuries, such as those in which multiple ribs are broken or the ribs have moved out of place.  HOME CARE INSTRUCTIONS   Avoid strenuous activity and any activities or movements that cause pain. Be careful during activities and avoid bumping the injured rib.  Gradually increase activity as directed by your caregiver.  Only take over-the-counter or prescription medications as directed by your caregiver. Do not take other medications without asking your caregiver first.  Apply ice to the injured area for the first 1-2 days after you have been treated or as directed by your caregiver. Applying ice helps to reduce inflammation and pain.  Put ice in a plastic bag.  Place a towel between your skin and the bag.   Leave the ice on for 15-20 minutes at a time, every 2 hours while you are awake.  Perform deep breathing as directed by your caregiver. This will help prevent pneumonia, which is a common complication of a broken rib. Your caregiver may instruct you to:  Take deep breaths several times a day.  Try to cough several times a day, holding a pillow  against the injured area.  Use a device called an incentive spirometer to practice deep breathing several times a day.  Drink enough fluids to keep your urine clear or pale yellow. This will help you avoid constipation.   Do not wear a rib belt or binder. These restrict breathing, which can lead to pneumonia.  SEEK IMMEDIATE MEDICAL CARE IF:   You have a fever.   You have difficulty  breathing or shortness of breath.   You develop a continual cough, or you cough up thick or bloody sputum.  You feel sick to your stomach (nausea), throw up (vomit), or have abdominal pain.   You have worsening pain not controlled with medications.  MAKE SURE YOU:  Understand these instructions.  Will watch your condition.  Will get help right away if you are not doing well or get worse.   This information is not intended to replace advice given to you by your health care provider. Make sure you discuss any questions you have with your health care provider.   Document Released: 03/25/2005 Document Revised: 11/25/2012 Document Reviewed: 05/27/2012 Elsevier Interactive Patient Education 2016 Else[employer] Inc[employer]  Pleural Effusion A pleural effusion is an abnormal buildup of fluid in the layers of tissue between your lungs and the inside of your chest (pleural space). These two layers of tissue that line both your lungs and the inside of your chest are called pleura. Usually, there is no air in the space between the pleura, only a thin layer of fluid. If left untreated, a large amount of fluid can build up and cause the lung to collapse. A pleural effusion is usually caused by another disease that requires treatment. The two main types of pleural effusion are:  Transudative pleural effusion. This happens when fluid leaks into the pleural space because of a low protein count in your blood or high blood pressure in your vessels. Heart failure often causes this.  Exudative infusion. This occurs when fluid collects in the pleural space from blocked blood vessels or lymph vessels. Some lung diseases, injuries, and cancers can cause this type of effusion. CAUSES Pleural effusion can be caused by:  Heart failure.  A blood clot in the lung (pulmonary embolism).  Pneumonia.  Cancer.  Liver failure (cirrhosis).  Kidney disease.  Complications from surgery, such as from open heart  surgery. SIGNS AND SYMPTOMS In some cases, pleural effusion may cause no symptoms. Symptoms can include:  Shortness of breath, especially when lying down.  Chest pain, often worse when taking a deep breath.  Fever.  Dry cough that is lasting (chronic).  Hiccups.  Rapid breathing. An underlying condition that is causing the pleural effusion (such as heart failure, pneumonia, blood clots, tuberculosis, or cancer) may also cause additional symptoms. DIAGNOSIS Your health care provider may suspect pleural effusion based on your symptoms and medical history. Your health care provider will also do a physical exam and a chest X-ray. If the X-ray shows there is fluid in your chest, you may need to have this fluid removed using a needle (thoracentesis) so it can be tested. You may also have:  Imaging studies of the chest, such as:  Ultrasound.  CT scan.  Blood tests for kidney and liver function. TREATMENT Treatment depends on the cause of the pleural effusion. Treatment may include:  Taking antibiotic medicines to clear up an infection that is causing the pleural effusion.  Placing a tube in the chest to drain the effusion (tube thoracostomy). This procedure is  often used when there is an infection in the fluid.  Surgery to remove the fibrous outer layer of tissue from the pleural space (decortication).  Thoracentesis, which can improve cough and shortness of breath.  A procedure to put medicine into the chest cavity to seal the pleural space to prevent fluid buildup (pleurodesis).  Chemotherapy and radiation therapy. These may be required in the case of cancerous (malignant) pleural effusion. HOME CARE INSTRUCTIONS  Take medicines only as directed by your health care provider.  Keep track of how long you can gently exercise before you get short of breath. Try simply walking at first.  Do not use any tobacco products, including cigarettes, chewing tobacco, or electronic  cigarettes. If you need help quitting, ask your health care provider.  Keep all follow-up visits as directed by your health care provider. This is important. SEEK MEDICAL CARE IF:  The amount of time that you are able to exercise decreases or does not improve with time.  You have pain or signs of infection at the puncture site if you had thoracentesis. Watch for:  Drainage.  Redness.  Swelling.  You have a fever. SEEK IMMEDIATE MEDICAL CARE IF:  You are short of breath.  You develop chest pain.  You develop a new cough. MAKE SURE YOU:  Understand these instructions.  Will watch your condition.  Will get help right away if you are not doing well or get worse.   This information is not intended to replace advice given to you by your health care provider. Make sure you discuss any questions you have with your health care provider.   Document Released: 03/25/2005 Document Revised: 04/15/2014 Document Reviewed: 08/18/2013 Elsevier Interactive Patient Education 2016 [employer].     IF you received an x-ray today, you will receive an invoice from [facility]. Please contact [facility] at [phone] with questions or concerns regarding your invoice.   IF you received labwork today, you will receive an invoice from [employer]. Please contact [facility] at [phone] with questions or concerns regarding your invoice.   Our billing staff will not be able to assist you with questions regarding bills from these companies.  You will be contacted with the lab results as soon as they are available. The fastest way to get your results is to activate your My Chart account. Instructions are located on the last page of this paperwork. If you have not heard from [place] regarding the results in 2 weeks, please contact this office.        I personally performed the services described in this documentation, which was scribed in my presence.  The recorded information has been reviewed and considered, and addended by me as needed.   Signed,   [name], MD [facility].  01/09/16 11:38 AM

## 2016-01-11 ENCOUNTER — Other Ambulatory Visit: Payer: Self-pay

## 2016-01-11 DIAGNOSIS — R109 Unspecified abdominal pain: Secondary | ICD-10-CM

## 2016-01-11 DIAGNOSIS — R0789 Other chest pain: Secondary | ICD-10-CM

## 2016-01-11 NOTE — Telephone Encounter (Signed)
Pt is having rib pain and would like a refill on his HYDROcodone-acetaminophen (NORCO/VICODIN) 5-325 MG tablet [[id number]]. He states a half a pill a day is not touching his pain. He has been taking a whole pill a day. Pharmacy:  [facility], [address] AT [address]. Please advise at [phone]

## 2016-01-16 MED ORDER — HYDROCODONE-ACETAMINOPHEN 5-325 MG PO TABS: 1.0000 | ORAL_TABLET | Freq: Four times a day (QID) | ORAL | 0 refills | Status: DC | PRN

## 2016-01-17 ENCOUNTER — Ambulatory Visit (INDEPENDENT_AMBULATORY_CARE_PROVIDER_SITE_OTHER): Payer: Commercial Managed Care - HMO | Admitting: Family Medicine

## 2016-01-17 ENCOUNTER — Ambulatory Visit (INDEPENDENT_AMBULATORY_CARE_PROVIDER_SITE_OTHER): Payer: Commercial Managed Care - HMO

## 2016-01-17 VITALS — BP 122/72 | HR 94 | Temp 98.9°F | Resp 17 | Ht 70.5 in | Wt 216.0 lb

## 2016-01-17 DIAGNOSIS — D62 Acute posthemorrhagic anemia: Secondary | ICD-10-CM

## 2016-01-17 DIAGNOSIS — J90 Pleural effusion, not elsewhere classified: Secondary | ICD-10-CM | POA: Diagnosis not present

## 2016-01-17 DIAGNOSIS — S2242XD Multiple fractures of ribs, left side, subsequent encounter for fracture with routine healing: Secondary | ICD-10-CM | POA: Diagnosis not present

## 2016-01-17 DIAGNOSIS — N289 Disorder of kidney and ureter, unspecified: Secondary | ICD-10-CM | POA: Diagnosis not present

## 2016-01-17 LAB — POCT CBC
Granulocyte percent: 78 %G (ref 37–80)
HCT, POC: 31.3 % — AB (ref 43.5–53.7)
Hemoglobin: 10.6 g/dL — AB (ref 14.1–18.1)
Lymph, poc: 1.9 (ref 0.6–3.4)
MCH, POC: 25.5 pg — AB (ref 27–31.2)
MCHC: 34 g/dL (ref 31.8–35.4)
MCV: 75.2 fL — AB (ref 80–97)
MID (cbc): 0.2 (ref 0–0.9)
MPV: 7.7 fL (ref 0–99.8)
POC Granulocyte: 7.3 — AB (ref 2–6.9)
POC LYMPH PERCENT: 20.3 %L (ref 10–50)
POC MID %: 1.7 %M (ref 0–12)
Platelet Count, POC: 264 10*3/uL (ref 142–424)
RBC: 4.17 M/uL — AB (ref 4.69–6.13)
RDW, POC: 16.6 %
WBC: 9.3 10*3/uL (ref 4.6–10.2)

## 2016-01-17 LAB — BASIC METABOLIC PANEL
BUN: 21 mg/dL (ref 7–25)
CO2: 23 mmol/L (ref 20–31)
Calcium: 9.2 mg/dL (ref 8.6–10.3)
Chloride: 107 mmol/L (ref 98–110)
Creat: 1.58 mg/dL — ABNORMAL HIGH (ref 0.70–1.18)
Glucose, Bld: 121 mg/dL — ABNORMAL HIGH (ref 65–99)
Potassium: 4.6 mmol/L (ref 3.5–5.3)
Sodium: 140 mmol/L (ref 135–146)

## 2016-01-17 NOTE — Progress Notes (Addendum)
Subjective:  By signing my name below, I, [name], attest that this documentation has been prepared under the direction and in the presence of [name], MD. Electronically Signed: [name], [place]. 01/17/2016 , 8:27 AM .  Patient was seen in Room 11 .   Patient ID: [name], male    DOB: Sep 16, 1933, 82 y.o.   MRN: [id number] Chief Complaint  Patient presents with  . Follow-up    RIB PAIN    HPI [name] is a 82 y.o. male Follow-up of multiple left rib fractures due to injury on Sept 2nd. He had a subsequent pleural effusion and hemothorax requiring admission to the hospital and thoracentesis with repeat thoracentesis for recurrence of pleural effusion on Sept 27th.   Patient states he's still sore over his left ribs. He has an occasional cough without any productivity. He takes up to 2 hydrocodone daily, and has run out of his medication. He went up to [facility], [place] and felt pain after standing for extended time, as well as discomfort when laying down. He denies fever or shortness of breath. He does note planning to go on a 6-day cruise on Oct 19th.   Left knee pain He also felt his left knee pop while he was in [place], [place]. He has a knee brace for this.   Last chest xray Oct 3rd:  Dg Chest 2 View  Result Date: 01/09/2016 CLINICAL DATA:  Follow-up rib fractures. EXAM: CHEST  2 VIEW COMPARISON:  01/03/2016. FINDINGS: Mediastinum hilar structures are normal. Heart size stable. Bilateral multifocal of subsegmental atelectasis again noted. Small left pleural effusion. Findings are stable stable from prior exam. Left rib fractures are stable. IMPRESSION: Bilateral multifocal areas of subsegmental atelectasis and small left pleural effusion again noted. These findings are stable from prior exam. Stable left rib fractures. No pneumothorax. Electronically Signed   By: Marcello Moores  Register   On: 01/09/2016 11:08   He's been using incentive spirometer and hydrocodone  as needed.   Anemia  Thought secondary to blood loss. Hemoglobin improved to 11.4 last visit with iron supplementation.   He is still taking iron supplement 1 a day.   Elevated creatinine / AKI Noted during intial hospitalization, with slight increase from his usual range, but most recently down to 1.36. Usual creatinine since last year is around 1.3.    Patient Active Problem List   Diagnosis Date Noted  . Fall 12/14/2015  . Elevated serum creatinine 12/14/2015  . Ileus ([place]) 12/14/2015  . Multiple fractures of ribs of left side 12/12/2015  . Blood in the urine 12/01/2013  . Cystitis, radiation 12/01/2013  . Malignant neoplasm of prostate ([place]) 11/26/2013  . Pulsatile tinnitus 11/19/2013  . Rectal bleeding 12/23/2012  . Type II or unspecified type diabetes mellitus without mention of complication, not stated as uncontrolled   . Essential hypertension, benign   . Chronic back pain   . ED (erectile dysfunction)   . Anemia   . Hyperlipidemia   . CAD, NATIVE VESSEL 05/08/2008  . VENTRICULAR TACHYCARDIA 05/08/2008  . PREMATURE VENTRICULAR CONTRACTIONS 05/08/2008   Past Medical History:  Diagnosis Date  . Allergy   . CAD (coronary artery disease)    pci to LAD 10/09; stable CAD by cath 05/31/08; myoview 04/11/11- no ishcemia, EF 67%; echo 05/15/09- EF>55%, mod calcification of the aortic valve leaflets  . Chronic back pain   . ED (erectile dysfunction)   . Essential hypertension, benign   . Hyperlipidemia   .  Multiple rib fractures    left 9th, 10th, and 11th posterior rib fractures S/P fall 12/09/2015/notes 12/12/2015  . Prostate cancer ([place])    s/p  . Type II or unspecified type diabetes mellitus without mention of complication, not stated as uncontrolled   . Ventricular tachycardia ([place])    resuscitated; monitor 05/2008; attempted T ablation 2/10- aborted due to inappropriate substrate   Past Surgical History:  Procedure Laterality Date  . CARDIAC CATHETERIZATION  05/31/08   EF  55-60%, stable lesions, medical therapy  . COLECTOMY  '80's   polyps  . CORONARY ANGIOPLASTY WITH STENT PLACEMENT  01/19/08   PCI stent to mid LAD with Promus DES 27.5x28  . IR GENERIC HISTORICAL  01/03/2016   IR THORACENTESIS ASP PLEURAL SPACE W/IMG GUIDE 01/03/2016 [facility]-INTER[facility] RAD  . PTCA  01/2008   No Known Allergies Prior to Admission medications   Medication Sig Start Date End Date Taking? Authorizing Provider  atorvastatin (LIPITOR) 10 MG tablet Take 1 tablet (10 mg total) by mouth daily. 11/16/15  Yes [name], MD  ferrous sulfate 325 (65 FE) MG tablet Take 325 mg by mouth daily with breakfast.   Yes Historical Provider, MD  glucose blood (TRUETEST TEST) test strip USE AS DIRECTED THREE TIMES DAILY 06/21/13  Yes [name], MD  HYDROcodone-acetaminophen (NORCO/VICODIN) 5-325 MG tablet Take 1-2 tablets by mouth every 6 (six) hours as needed for moderate pain. 01/16/16  Yes [name], MD  insulin NPH-regular Human (NOVOLIN 70/30) (70-30) 100 UNIT/ML injection Inject 7 Units into the skin 2 (two) times daily with a meal. 11/16/15  Yes [name], MD  Insulin Syringe-Needle U-100 (B-D INS SYR HALF-UNIT .3CC/31G) 31G X 5/16" 0.3 ML MISC Use daily at bedtime to inject insulin. Dx code: 250.00 06/21/13  Yes [name], MD  lisinopril-hydrochlorothiazide (PRINZIDE,ZESTORETIC) 20-12.5 MG tablet Take 1 tablet by mouth daily. 11/16/15  Yes [name], MD  metFORMIN (GLUCOPHAGE) 1000 MG tablet Take 1 tablet (1,000 mg total) by mouth 2 (two) times daily with a meal. 11/16/15  Yes [name], MD  metoprolol (LOPRESSOR) 50 MG tablet Take 1 tablet (50 mg total) by mouth 2 (two) times daily. 11/16/15  Yes [name], MD  Multiple Vitamin (MULTIVITAMIN) tablet Take 1 tablet by mouth daily.   Yes Historical Provider, MD  Omega-3 Fatty Acids (FISH OIL) 1000 MG CAPS Take 2 capsules by mouth daily.    Yes Historical Provider, MD  sildenafil (VIAGRA) 50 MG tablet Take 1  tablet (50 mg total) by mouth daily as needed for erectile dysfunction. 02/08/15  Yes Lorretta Harp, MD  traMADol (ULTRAM) 50 MG tablet Take 1-2 tablets (50-100 mg total) by mouth every 6 (six) hours as needed (Pain). 12/14/15  Yes Lisette Abu, PA-C  Vitamin D, Cholecalciferol, 1000 UNITS TABS Take 1 tablet by mouth daily.    Yes Historical Provider, MD   Social History   Social History  . Marital status: Legally Separated    Spouse name: [name]  . Number of children: 7  . Years of education: [name]   Occupational History  . RETIRED Retired   Social History Main Topics  . Smoking status: Current Some Day Smoker    Packs/day: 0.50    Years: 15.00    Types: Cigarettes    Last attempt to quit: 09/01/1990  . Smokeless tobacco: Never Used     Comment: quit 12/07/2013  . Alcohol use No  . Drug use:  No  . Sexual activity: No   Other Topics Concern  . Not on file   Social History Narrative   29 grandchildren. Single. Education: 12 th grade. Exercise: 3-4 times a week for 30 minutes working out and set ups.   Review of Systems  Constitutional: Negative for chills, fatigue, fever and unexpected weight change.  Eyes: Negative for visual disturbance.  Respiratory: Positive for chest tightness (discomfort). Negative for cough and shortness of breath.   Cardiovascular: Negative for chest pain, palpitations and leg swelling.  Gastrointestinal: Negative for abdominal pain and blood in stool.  Neurological: Negative for dizziness, light-headedness and headaches.       Objective:   Physical Exam  Constitutional: He is oriented to person, place, and time. He appears well-developed and well-nourished.  HENT:  Head: Normocephalic and atraumatic.  Eyes: EOM are normal. Pupils are equal, round, and reactive to light.  Neck: No JVD present. Carotid bruit is not present.  Cardiovascular: Normal rate, regular rhythm and normal heart sounds.   No murmur heard. Pulmonary/Chest: Effort normal. He  has decreased breath sounds in the left lower field. He has rales in the left lower field.  Musculoskeletal: He exhibits no edema.  Skin intact, no ecchymosis, tender along left lower rib margin  Neurological: He is alert and oriented to person, place, and time.  Skin: Skin is warm and dry.  Psychiatric: He has a normal mood and affect.  Vitals reviewed.   Vitals:   01/17/16 0809  BP: 122/72  Pulse: 94  Resp: 17  Temp: 98.9 F (37.2 C)  TempSrc: Oral  SpO2: 98%  Weight: 216 lb (98 kg)  Height: 5' 10.5" (1.791 m)   Dg Chest 2 View  Result Date: 01/17/2016 CLINICAL DATA:  Follow-up of multiple left rib fractures due to injury on Sept 2nd. He had a subsequent pleural effusion and hemothorax requiring admission to the hospital and thoracentesis with repeat thoracentesis for recurrence of pleural effusion on Sept 27th. EXAM: CHEST  2 VIEW COMPARISON:  01/09/2016 FINDINGS: Left pleural effusion with associated lung base opacity, most likely atelectasis, stable from the most recent prior study. No new lung opacities. No right pleural effusion.  No pneumothorax. Cardiac silhouette is normal in size. No mediastinal or hilar masses or evidence of adenopathy. IMPRESSION: 1. Persistent left lung base opacity reflecting a combination of a small moderate pleural effusion with associated atelectasis. No convincing pneumonia. No change from the prior study. No pneumothorax. Electronically Signed   By: Lajean Manes M.D.   On: 01/17/2016 09:02    Results for orders placed or performed in visit on 01/17/16  POCT CBC  Result Value Ref Range   WBC 9.3 4.6 - 10.2 K/uL   Lymph, poc 1.9 0.6 - 3.4   POC LYMPH PERCENT 20.3 10 - 50 %L   MID (cbc) 0.2 0 - 0.9   POC MID % 1.7 0 - 12 %M   POC Granulocyte 7.3 (A) 2 - 6.9   Granulocyte percent 78.0 37 - 80 %G   RBC 4.17 (A) 4.69 - 6.13 M/uL   Hemoglobin 10.6 (A) 14.1 - 18.1 g/dL   HCT, POC 31.3 (A) 43.5 - 53.7 %   MCV 75.2 (A) 80 - 97 fL   MCH, POC 25.5  (A) 27 - 31.2 pg   MCHC 34.0 31.8 - 35.4 g/dL   RDW, POC 16.6 %   Platelet Count, POC 264 142 - 424 K/uL   MPV 7.7 0 - 99.8 fL  Assessment & Plan:  [name] is a 82 y.o. male Closed fracture of multiple ribs of left side with routine healing, subsequent encounter - Plan: DG Chest 2 View  Pleural effusion on left - Plan: DG Chest 2 View  Renal insufficiency - Plan: Basic metabolic panel  Acute blood loss anemia - Plan: POCT CBC  Persistent but stable pain from left rib fractures, overall controlled with hydrocodone, intermittent dosing.  Still with pleural effusion with atelectasis - overall unchanged, but persistent. No s/sx of pneumonia. Plans on cruise in 8 days that will be for 6 days.   -New prescription hydrocodone given today. SED, and use only as needed for pain to allow incentive spirometry.   - Will need to discuss the results of his chest x-ray with pulmonary or trauma to determine if and when repeat thoracentesis needed, as well as relative safety in him traveling on a cruise next week.  - Repeat CBC slightly lower, but overall stable with history of anemia. Continue iron supplementation.   - Repeat BMP with renal insufficiency which is likely related to his diabetes.  - plan on recheck in 1 week prior to cruise.   1:28 PM 01/19/16 Discuss with trauma surgeon. Based on appearance of chest x-ray, appears to be stable effusion or even slightly improved. In discussion with patient his cruise is not for another month. Will follow-up in approximately 2 weeks, but advised if any fevers, shortness of breath, or worsening symptoms, would look into other treatment and definitely repeat x-ray and testing. If fluid persists, consider thoracic surgeon evaluation to see if other intervention required. RTC precautions if worse   No orders of the defined types were placed in this encounter.  Patient Instructions   Blood count is down slightly from last visit, but not  dangerous level. Continue iron supplement, but if any blood in the stools, blood in urine, or dark or tarry stools, return to discuss those symptoms right away..   No significant change in your lungs. There is still fluid within the lung. I would like to talk about that further with one of the specialists to determine the next step, and to decide if it is okay for you to go on a cruise next week. I'll be in touch with you the next few days.  For your rib pain, continue the hydrocodone, use of the incentive spirometer, and if any new cough, fever, shortness of breath or any worsening symptoms return here or the emergency room.     IF you received an x-ray today, you will receive an invoice from [facility]. Please contact [facility] at [phone] with questions or concerns regarding your invoice.   IF you received labwork today, you will receive an invoice from [employer]. Please contact [facility] at [phone] with questions or concerns regarding your invoice.   Our billing staff will not be able to assist you with questions regarding bills from these companies.  You will be contacted with the lab results as soon as they are available. The fastest way to get your results is to activate your My Chart account. Instructions are located on the last page of this paperwork. If you have not heard from [place] regarding the results in 2 weeks, please contact this office.       I personally performed the services described in this documentation, which was scribed in my presence. The recorded information has been reviewed and considered, and addended by me as needed.  Signed,   [name], MD [facility].  01/17/16 6:17 PM

## 2016-01-17 NOTE — Patient Instructions (Addendum)
Blood count is down slightly from last visit, but not dangerous level. Continue iron supplement, but if any blood in the stools, blood in urine, or dark or tarry stools, return to discuss those symptoms right away..   No significant change in your lungs. There is still fluid within the lung. I would like to talk about that further with one of the specialists to determine the next step, and to decide if it is okay for you to go on a cruise next week. I'll be in touch with you the next few days.  For your rib pain, continue the hydrocodone, use of the incentive spirometer, and if any new cough, fever, shortness of breath or any worsening symptoms return here or the emergency room.     IF you received an x-ray today, you will receive an invoice from [facility]. Please contact [facility] at [phone] with questions or concerns regarding your invoice.   IF you received labwork today, you will receive an invoice from [employer]. Please contact [facility] at [phone] with questions or concerns regarding your invoice.   Our billing staff will not be able to assist you with questions regarding bills from these companies.  You will be contacted with the lab results as soon as they are available. The fastest way to get your results is to activate your My Chart account. Instructions are located on the last page of this paperwork. If you have not heard from [place] regarding the results in 2 weeks, please contact this office.

## 2016-01-23 ENCOUNTER — Ambulatory Visit: Payer: Commercial Managed Care - HMO

## 2016-01-30 ENCOUNTER — Ambulatory Visit (INDEPENDENT_AMBULATORY_CARE_PROVIDER_SITE_OTHER): Payer: Commercial Managed Care - HMO | Admitting: Family Medicine

## 2016-01-30 ENCOUNTER — Encounter: Payer: Self-pay | Admitting: Family Medicine

## 2016-01-30 VITALS — BP 132/70 | HR 84 | Temp 98.2°F | Resp 16 | Ht 70.0 in | Wt 216.8 lb

## 2016-01-30 DIAGNOSIS — R109 Unspecified abdominal pain: Secondary | ICD-10-CM | POA: Diagnosis not present

## 2016-01-30 DIAGNOSIS — R0789 Other chest pain: Secondary | ICD-10-CM | POA: Diagnosis not present

## 2016-01-30 DIAGNOSIS — G8929 Other chronic pain: Secondary | ICD-10-CM | POA: Diagnosis not present

## 2016-01-30 DIAGNOSIS — D62 Acute posthemorrhagic anemia: Secondary | ICD-10-CM | POA: Diagnosis not present

## 2016-01-30 DIAGNOSIS — N189 Chronic kidney disease, unspecified: Secondary | ICD-10-CM | POA: Diagnosis not present

## 2016-01-30 DIAGNOSIS — J90 Pleural effusion, not elsewhere classified: Secondary | ICD-10-CM

## 2016-01-30 DIAGNOSIS — M25562 Pain in left knee: Secondary | ICD-10-CM

## 2016-01-30 DIAGNOSIS — S2242XD Multiple fractures of ribs, left side, subsequent encounter for fracture with routine healing: Secondary | ICD-10-CM | POA: Diagnosis not present

## 2016-01-30 DIAGNOSIS — M25561 Pain in right knee: Secondary | ICD-10-CM

## 2016-01-30 LAB — CBC
HCT: 34.9 % — ABNORMAL LOW (ref 38.5–50.0)
Hemoglobin: 11.1 g/dL — ABNORMAL LOW (ref 13.2–17.1)
MCH: 24.7 pg — ABNORMAL LOW (ref 27.0–33.0)
MCHC: 31.8 g/dL — ABNORMAL LOW (ref 32.0–36.0)
MCV: 77.7 fL — ABNORMAL LOW (ref 80.0–100.0)
MPV: 9.9 fL (ref 7.5–12.5)
Platelets: 241 10*3/uL (ref 140–400)
RBC: 4.49 MIL/uL (ref 4.20–5.80)
RDW: 17.3 % — ABNORMAL HIGH (ref 11.0–15.0)
WBC: 6.4 10*3/uL (ref 3.8–10.8)

## 2016-01-30 LAB — BASIC METABOLIC PANEL
BUN: 15 mg/dL (ref 7–25)
CO2: 24 mmol/L (ref 20–31)
Calcium: 9.1 mg/dL (ref 8.6–10.3)
Chloride: 107 mmol/L (ref 98–110)
Creat: 1.27 mg/dL — ABNORMAL HIGH (ref 0.70–1.18)
Glucose, Bld: 125 mg/dL — ABNORMAL HIGH (ref 65–99)
Potassium: 4.4 mmol/L (ref 3.5–5.3)
Sodium: 140 mmol/L (ref 135–146)

## 2016-01-30 MED ORDER — HYDROCODONE-ACETAMINOPHEN 5-325 MG PO TABS: 1.0000 | ORAL_TABLET | Freq: Four times a day (QID) | ORAL | 0 refills | Status: DC | PRN

## 2016-01-30 NOTE — Patient Instructions (Addendum)
  For your rib pain - can take tylenol over the counter, but for more severe pain, can take hydrocodone if needed. Avoid advil, alleve or other over the counter NSAIDS.  Also make sure you are staying hydrated - drink plenty of water.   If any shortness of breath or fever, or worsening chest pain- return right away.  For low blood count (anemia) ok to continue iron, but return with test for blood in stool. If that is positive, then I would want you to meet back with Dr. Benson [place] to see if you need a repeat colonoscopy.   I can refer you to an orthopedist for your knee pain.  Ok to continue knee brace, tylenol over the counter as above.    Recheck in 2 weeks. Sooner if worse.   IF you received an x-ray today, you will receive an invoice from [facility]. Please contact [facility] at [phone] with questions or concerns regarding your invoice.   IF you received labwork today, you will receive an invoice from [employer]. Please contact [facility] at [phone] with questions or concerns regarding your invoice.   Our billing staff will not be able to assist you with questions regarding bills from these companies.  You will be contacted with the lab results as soon as they are available. The fastest way to get your results is to activate your My Chart account. Instructions are located on the last page of this paperwork. If you have not heard from [place] regarding the results in 2 weeks, please contact this office.

## 2016-01-30 NOTE — Progress Notes (Addendum)
By signing my name below, I, [name], attest that this documentation has been prepared under the direction and in the presence of [name], MD.  Electronically Signed: [name], Medical Scribe. 01/30/16. 8:30 AM.  Subjective:    Patient ID: [name], male    DOB: 02-11-1934, 82 y.o.   MRN: [id number]  HPI Chief Complaint  Patient presents with  . Follow-up    refill on norco    HPI Comments: [name] is a 82 y.o. male who presents to the [facility] for right rib fracture and right sided pleural effusion. Pt would like to get a refill on hydrocodone. Date of injury was Sept 2nd. He's required 2 separate thoracentesis for pulmonary effusion. Most recent CXR discussed with trauma surgeon 2 week ago, who felt that was overall stable/improved and could be followed based on symptoms. Pain has been treated with hydrocodone PRN.  Pt reports occasional dry cough and states his ribs are throbbing, but it's "not as bad as it used to be". Pt's last dose of hydrocodone was 2 days ago and he's been taking it QD with relief of his symptoms without any other pain medications. Pt has been sleeping in his recliner and has been using his incentive spirometer. Pt has been drinking 40 oz of water a day. Pt denies constipation and abdominal pain.  CKD with elevation of creatinine while hospitalized.  He peaked at 1.85 when he was in the hospital.  Somewhat increased last ov from 1.33, 1.36 on prev visits. Not taking nsaids.  Lab Results  Component Value Date   CREATININE 1.58 (H) 01/17/2016   Anemia: Low of 9.7 on Sept 12th. He has ranged from approx 11-12 in the past. He had a colonoscopy in 2011. GI is Dr. 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 Pt is taking iron supplements QD. Pt has not had a repeat colonoscopy and states he was told he didn't have to come back in. Pt denies bloody stool. Lab Results  Component  Value Date   WBC 9.3 01/17/2016   HGB 10.6 (A) 01/17/2016   HCT 31.3 (A) 01/17/2016   MCV 75.2 (A) 01/17/2016   PLT 209 12/13/2015   Knee Pain: Pt reports bilateral knee pain, R>L, and states the pain is the worst in the morning when he gets up.  Pt wears a knee braces on both knees as well as rubs a OTC cream on both knees from [facility] for some relief to his symptoms. He has received platelet rich plasma treatment in the past from [facility]. Some initial improvement, but pain has returned.   Patient Active Problem List   Diagnosis Date Noted  . Fall 12/14/2015  . Elevated serum creatinine 12/14/2015  . Ileus ([place]) 12/14/2015  . Multiple fractures of ribs of left side 12/12/2015  . Blood in the urine 12/01/2013  . Cystitis, radiation 12/01/2013  . Malignant neoplasm of prostate ([place]) 11/26/2013  . Pulsatile tinnitus 11/19/2013  . Rectal bleeding 12/23/2012  . Type II or unspecified type diabetes mellitus without mention of complication, not stated as uncontrolled   . Essential hypertension, benign   . Chronic back pain   . ED (erectile dysfunction)   . Anemia   . Hyperlipidemia   . CAD, NATIVE VESSEL 05/08/2008  . VENTRICULAR TACHYCARDIA 05/08/2008  . PREMATURE VENTRICULAR CONTRACTIONS 05/08/2008   Past Medical History:  Diagnosis Date  . Allergy   . CAD (coronary  artery disease)    pci to LAD 10/09; stable CAD by cath 05/31/08; myoview 04/11/11- no ishcemia, EF 67%; echo 05/15/09- EF>55%, mod calcification of the aortic valve leaflets  . Chronic back pain   . ED (erectile dysfunction)   . Essential hypertension, benign   . Hyperlipidemia   . Multiple rib fractures    left 9th, 10th, and 11th posterior rib fractures S/P fall 12/09/2015/notes 12/12/2015  . Prostate cancer ([place])    s/p  . Type II or unspecified type diabetes mellitus without mention of complication, not stated as uncontrolled   . Ventricular tachycardia ([place])    resuscitated; monitor 05/2008; attempted T  ablation 2/10- aborted due to inappropriate substrate   Past Surgical History:  Procedure Laterality Date  . CARDIAC CATHETERIZATION  05/31/08   EF 55-60%, stable lesions, medical therapy  . COLECTOMY  '80's   polyps  . CORONARY ANGIOPLASTY WITH STENT PLACEMENT  01/19/08   PCI stent to mid LAD with Promus DES 27.5x28  . IR GENERIC HISTORICAL  01/03/2016   IR THORACENTESIS ASP PLEURAL SPACE W/IMG GUIDE 01/03/2016 [facility]INTER[facility] RAD  . PTCA  01/2008   No Known Allergies Prior to Admission medications   Medication Sig Start Date End Date Taking? Authorizing Provider  atorvastatin (LIPITOR) 10 MG tablet Take 1 tablet (10 mg total) by mouth daily. 11/16/15   [name], MD  ferrous sulfate 325 (65 FE) MG tablet Take 325 mg by mouth daily with breakfast.    Historical Provider, MD  glucose blood (TRUETEST TEST) test strip USE AS DIRECTED THREE TIMES DAILY 06/21/13   [name], MD  HYDROcodone-acetaminophen (NORCO/VICODIN) 5-325 MG tablet Take 1-2 tablets by mouth every 6 (six) hours as needed for moderate pain. 01/16/16   [name], MD  insulin NPH-regular Human (NOVOLIN 70/30) (70-30) 100 UNIT/ML injection Inject 7 Units into the skin 2 (two) times daily with a meal. 11/16/15   [name], MD  Insulin Syringe-Needle U-100 (B-D INS SYR HALF-UNIT .3CC/31G) 31G X 5/16" 0.3 ML MISC Use daily at bedtime to inject insulin. Dx code: 250.00 06/21/13   [name], MD  lisinopril-hydrochlorothiazide (PRINZIDE,ZESTORETIC) 20-12.5 MG tablet Take 1 tablet by mouth daily. 11/16/15   [name], MD  metFORMIN (GLUCOPHAGE) 1000 MG tablet Take 1 tablet (1,000 mg total) by mouth 2 (two) times daily with a meal. 11/16/15   [name], MD  metoprolol (LOPRESSOR) 50 MG tablet Take 1 tablet (50 mg total) by mouth 2 (two) times daily. 11/16/15   [name], MD  Multiple Vitamin (MULTIVITAMIN) tablet Take 1 tablet by mouth daily.    Historical Provider, MD  Omega-3 Fatty Acids (FISH  OIL) 1000 MG CAPS Take 2 capsules by mouth daily.     Historical Provider, MD  sildenafil (VIAGRA) 50 MG tablet Take 1 tablet (50 mg total) by mouth daily as needed for erectile dysfunction. 02/08/15   Lorretta Harp, MD  traMADol (ULTRAM) 50 MG tablet Take 1-2 tablets (50-100 mg total) by mouth every 6 (six) hours as needed (Pain). 12/14/15   Lisette Abu, PA-C  Vitamin D, Cholecalciferol, 1000 UNITS TABS Take 1 tablet by mouth daily.     Historical Provider, MD   Social History   Social History  . Marital status: Legally Separated    Spouse name: [name]  . Number of children: 7  . Years of education: [name]   Occupational History  . RETIRED Retired   Science writer History Main  Topics  . Smoking status: Current Some Day Smoker    Packs/day: 0.50    Years: 15.00    Types: Cigarettes    Last attempt to quit: 09/01/1990  . Smokeless tobacco: Never Used     Comment: quit 12/07/2013  . Alcohol use No  . Drug use: No  . Sexual activity: No   Other Topics Concern  . Not on file   Social History Narrative   29 grandchildren. Single. Education: 12 th grade. Exercise: 3-4 times a week for 30 minutes working out and set ups.   Review of Systems  Respiratory: Positive for cough.   Gastrointestinal: Negative for abdominal pain, blood in stool and constipation.  Musculoskeletal: Positive for arthralgias.  Psychiatric/Behavioral: Negative for sleep disturbance.   Objective:  Physical Exam  Constitutional: He appears well-developed and well-nourished. No distress.  HENT:  Head: Normocephalic and atraumatic.  Eyes: Conjunctivae are normal.  Neck: Neck supple.  Cardiovascular: Normal rate, regular rhythm and normal heart sounds.  Exam reveals no gallop and no friction rub.   No murmur heard. Pulmonary/Chest: Effort normal. No respiratory distress. He has decreased breath sounds in the left lower field. He has rales in the left lower field.  Slight dec breath sounds left base with some rales  otherwise clear Nl effort no distress  Abdominal: Soft. He exhibits no distension. There is no tenderness.  Musculoskeletal: He exhibits no edema (lower extremity).  FROM in knee but crepitus bilaterally No focal bony tenderness on left Slight joint line tenderness on the right  Neurological: He is alert.  Skin: Skin is warm, dry and intact. No ecchymosis noted.  Psychiatric: He has a normal mood and affect. His behavior is normal.  Nursing note and vitals reviewed.  BP 132/70 (BP Location: Right Arm, Patient Position: Sitting, Cuff Size: Normal)   Pulse 84   Temp 98.2 F (36.8 C) (Oral)   Resp 16   Ht 5\' 10"  (1.778 m)   Wt 216 lb 12.8 oz (98.3 kg)   SpO2 99%   BMI 31.11 kg/m    .this Assessment & Plan:   [name] is a 82 y.o. male Closed fracture of multiple ribs of left side with routine healing, subsequent encounter - Plan: HYDROcodone-acetaminophen (NORCO/VICODIN) 5-325 MG tablet Left-sided chest wall pain - Plan: HYDROcodone-acetaminophen (NORCO/VICODIN) 5-325 MG tablet Acute left flank pain - Plan: HYDROcodone-acetaminophen (NORCO/VICODIN) 5-325 MG tablet  - Multiple left rib fractures, date of injury September 2. Resultant pleural effusion requiring 2 separate episodes of thoracentesis. Symptoms have been stable/improving. Discussed chest x-ray after last visit with trauma surgeon, no repeat chest x-ray indicated at this time as symptomatically improving. Did warn of possible empyema or complications from fluid, but does not appear to have this currently.   -Continue hydrocodone if needed for breakthrough pain, but also recommended Tylenol when not taking hydrocodone as this may be better tolerated and less side effects. Continue incentive spirometry  Recheck in 2 weeks for probable repeat CXR.  RTC precautions if worsening sooner.   Acute blood loss anemia - Plan: CBC, IFOBT POC (occult bld, rslt in office)  - Likely acute on chronic anemia. Repeat CBC, will check  stool for blood, and if positive, follow-up with GI. Continue iron once per day.    -addendum: heme positive stool, will refer to GI for further eval and to discuss possible repeat colonoscopy.   Chronic kidney disease, unspecified CKD stage - Plan: Basic metabolic panel  - Likely due to diabetes.  Check BMP, avoid nephrotoxins, consider nephrology eval if increasing.  Chronic pain of both knees - Plan: Ambulatory referral to Orthopedic Surgery  - Osteoarthritis bilaterally, minimal relief with PRP prior. Refer to orthopedic surgery for management options.  Pleural effusion, left  - As above, symptomatically stable. Plan on repeat chest x-ray in 2 weeks.   Meds ordered this encounter  Medications  . HYDROcodone-acetaminophen (NORCO/VICODIN) 5-325 MG tablet    Sig: Take 1-2 tablets by mouth every 6 (six) hours as needed for moderate pain.    Dispense:  20 tablet    Refill:  0   Patient Instructions    For your rib pain - can take tylenol over the counter, but for more severe pain, can take hydrocodone if needed. Avoid advil, alleve or other over the counter NSAIDS.  Also make sure you are staying hydrated - drink plenty of water.   If any shortness of breath or fever, or worsening chest pain- return right away.  For low blood count (anemia) ok to continue iron, but return with test for blood in stool. If that is positive, then I would want you to meet back with Dr. Benson Norway to see if you need a repeat colonoscopy.   I can refer you to an orthopedist for your knee pain.  Ok to continue knee brace, tylenol over the counter as above.    Recheck in 2 weeks. Sooner if worse.   IF you received an x-ray today, you will receive an invoice from [facility]. Please contact [facility] at [phone] with questions or concerns regarding your invoice.   IF you received labwork today, you will receive an invoice from [employer]. Please contact [facility] at  [phone] with questions or concerns regarding your invoice.   Our billing staff will not be able to assist you with questions regarding bills from these companies.  You will be contacted with the lab results as soon as they are available. The fastest way to get your results is to activate your My Chart account. Instructions are located on the last page of this paperwork. If you have not heard from [place] regarding the results in 2 weeks, please contact this office.        I personally performed the services described in this documentation, which was scribed in my presence. The recorded information has been reviewed and considered, and addended by me as needed.   Signed,   [name], MD [facility].  01/30/16 9:24 AM

## 2016-01-31 LAB — IFOBT (OCCULT BLOOD): IFOBT: POSITIVE

## 2016-02-05 DIAGNOSIS — M170 Bilateral primary osteoarthritis of knee: Secondary | ICD-10-CM | POA: Diagnosis not present

## 2016-02-06 NOTE — Addendum Note (Signed)
Addended by: [name] R on: 02/06/2016 05:18 PM   Modules accepted: Orders

## 2016-02-12 ENCOUNTER — Ambulatory Visit (INDEPENDENT_AMBULATORY_CARE_PROVIDER_SITE_OTHER): Payer: Commercial Managed Care - HMO | Admitting: Family Medicine

## 2016-02-12 ENCOUNTER — Ambulatory Visit (INDEPENDENT_AMBULATORY_CARE_PROVIDER_SITE_OTHER): Payer: Commercial Managed Care - HMO

## 2016-02-12 VITALS — BP 160/68 | HR 86 | Temp 98.2°F | Resp 16 | Ht 69.25 in | Wt 218.6 lb

## 2016-02-12 DIAGNOSIS — D649 Anemia, unspecified: Secondary | ICD-10-CM | POA: Diagnosis not present

## 2016-02-12 DIAGNOSIS — Z720 Tobacco use: Secondary | ICD-10-CM | POA: Diagnosis not present

## 2016-02-12 DIAGNOSIS — R0789 Other chest pain: Secondary | ICD-10-CM | POA: Diagnosis not present

## 2016-02-12 DIAGNOSIS — J90 Pleural effusion, not elsewhere classified: Secondary | ICD-10-CM

## 2016-02-12 DIAGNOSIS — S2242XD Multiple fractures of ribs, left side, subsequent encounter for fracture with routine healing: Secondary | ICD-10-CM | POA: Diagnosis not present

## 2016-02-12 DIAGNOSIS — R109 Unspecified abdominal pain: Secondary | ICD-10-CM

## 2016-02-12 DIAGNOSIS — S2242XA Multiple fractures of ribs, left side, initial encounter for closed fracture: Secondary | ICD-10-CM | POA: Diagnosis not present

## 2016-02-12 LAB — CBC WITH DIFFERENTIAL/PLATELET
Basophils Absolute: 0 cells/uL (ref 0–200)
Basophils Relative: 0 %
Eosinophils Absolute: 280 cells/uL (ref 15–500)
Eosinophils Relative: 4 %
HCT: 37 % — ABNORMAL LOW (ref 38.5–50.0)
Hemoglobin: 11.7 g/dL — ABNORMAL LOW (ref 13.2–17.1)
Lymphocytes Relative: 22 %
Lymphs Abs: 1540 cells/uL (ref 850–3900)
MCH: 24.5 pg — ABNORMAL LOW (ref 27.0–33.0)
MCHC: 31.6 g/dL — ABNORMAL LOW (ref 32.0–36.0)
MCV: 77.6 fL — ABNORMAL LOW (ref 80.0–100.0)
MPV: 10.1 fL (ref 7.5–12.5)
Monocytes Absolute: 700 cells/uL (ref 200–950)
Monocytes Relative: 10 %
Neutro Abs: 4480 cells/uL (ref 1500–7800)
Neutrophils Relative %: 64 %
Platelets: 260 10*3/uL (ref 140–400)
RBC: 4.77 MIL/uL (ref 4.20–5.80)
RDW: 16.2 % — ABNORMAL HIGH (ref 11.0–15.0)
WBC: 7 10*3/uL (ref 3.8–10.8)

## 2016-02-12 MED ORDER — HYDROCODONE-ACETAMINOPHEN 5-325 MG PO TABS: 1.0000 | ORAL_TABLET | Freq: Four times a day (QID) | ORAL | 0 refills | Status: DC | PRN

## 2016-02-12 NOTE — Patient Instructions (Addendum)
For anemia, and blood that was noticed in the stool, follow-up with gastroenterology as planned.  Return to the clinic or go to the nearest emergency room if any of your symptoms worsen or new symptoms occur.  There is still some fluid in the left lung, but again it appears stable. Okay to continue hydrocodone only if needed for pain. If any increased pain, increased cough, shortness of breath, or any new or worsening symptoms prior to your cruise, return for recheck. You should be continuing to improve before you take that cruise.   It is very important you quit smoking, especially with other medical issues currently. [place] offers smoking cessation clinics. Registration is required. To register call [phone] or register online at [email or web address]  Smoking Cessation, Tips for Success If you are ready to quit smoking, congratulations! You have chosen to help yourself be healthier. Cigarettes bring nicotine, tar, carbon monoxide, and other irritants into your body. Your lungs, heart, and blood vessels will be able to work better without these poisons. There are many different ways to quit smoking. Nicotine gum, nicotine patches, a nicotine inhaler, or nicotine nasal spray can help with physical craving. Hypnosis, support groups, and medicines help break the habit of smoking. WHAT THINGS CAN I DO TO MAKE QUITTING EASIER?  Here are some tips to help you quit for good:  Pick a date when you will quit smoking completely. Tell all of your friends and family about your plan to quit on that date.  Do not try to slowly cut down on the number of cigarettes you are smoking. Pick a quit date and quit smoking completely starting on that day.  Throw away all cigarettes.   Clean and remove all ashtrays from your home, work, and car.  On a card, write down your reasons for quitting. Carry the card with you and read it when you get the urge to smoke.  Cleanse your body of nicotine. Drink enough water  and fluids to keep your urine clear or pale yellow. Do this after quitting to flush the nicotine from your body.  Learn to predict your moods. Do not let a bad situation be your excuse to have a cigarette. Some situations in your life might tempt you into wanting a cigarette.  Never have "just one" cigarette. It leads to wanting another and another. Remind yourself of your decision to quit.  Change habits associated with smoking. If you smoked while driving or when feeling stressed, try other activities to replace smoking. Stand up when drinking your coffee. Brush your teeth after eating. Sit in a different chair when you read the paper. Avoid alcohol while trying to quit, and try to drink fewer caffeinated beverages. Alcohol and caffeine may urge you to smoke.  Avoid foods and drinks that can trigger a desire to smoke, such as sugary or spicy foods and alcohol.  Ask people who smoke not to smoke around you.  Have something planned to do right after eating or having a cup of coffee. For example, plan to take a walk or exercise.  Try a relaxation exercise to calm you down and decrease your stress. Remember, you may be tense and nervous for the first 2 weeks after you quit, but this will pass.  Find new activities to keep your hands busy. Play with a pen, coin, or rubber band. Doodle or draw things on paper.  Brush your teeth right after eating. This will help cut down on the craving for the taste of  tobacco after meals. You can also try mouthwash.   Use oral substitutes in place of cigarettes. Try using lemon drops, carrots, cinnamon sticks, or chewing gum. Keep them handy so they are available when you have the urge to smoke.  When you have the urge to smoke, try deep breathing.  Designate your home as a nonsmoking area.  If you are a heavy smoker, ask your health care provider about a prescription for nicotine chewing gum. It can ease your withdrawal from nicotine.  Reward yourself. Set  aside the cigarette money you save and buy yourself something nice.  Look for support from others. Join a support group or smoking cessation program. Ask someone at home or at work to help you with your plan to quit smoking.  Always ask yourself, "Do I need this cigarette or is this just a reflex?" Tell yourself, "Today, I choose not to smoke," or "I do not want to smoke." You are reminding yourself of your decision to quit.  Do not replace cigarette smoking with electronic cigarettes (commonly called e-cigarettes). The safety of e-cigarettes is unknown, and some may contain harmful chemicals.  If you relapse, do not give up! Plan ahead and think about what you will do the next time you get the urge to smoke. HOW WILL I FEEL WHEN I QUIT SMOKING? You may have symptoms of withdrawal because your body is used to nicotine (the addictive substance in cigarettes). You may crave cigarettes, be irritable, feel very hungry, cough often, get headaches, or have difficulty concentrating. The withdrawal symptoms are only temporary. They are strongest when you first quit but will go away within 10-14 days. When withdrawal symptoms occur, stay in control. Think about your reasons for quitting. Remind yourself that these are signs that your body is healing and getting used to being without cigarettes. Remember that withdrawal symptoms are easier to treat than the major diseases that smoking can cause.  Even after the withdrawal is over, expect periodic urges to smoke. However, these cravings are generally short lived and will go away whether you smoke or not. Do not smoke! WHAT RESOURCES ARE AVAILABLE TO HELP ME QUIT SMOKING? Your health care provider can direct you to community resources or hospitals for support, which may include:  Group support.  Education.  Hypnosis.  Therapy.   This information is not intended to replace advice given to you by your health care provider. Make sure you discuss any  questions you have with your health care provider.   Document Released: 12/22/2003 Document Revised: 04/15/2014 Document Reviewed: 09/10/2012 Elsevier Interactive Patient Education 2016 [employer].     IF you received an x-ray today, you will receive an invoice from [facility]. Please contact [facility] at [phone] with questions or concerns regarding your invoice.   IF you received labwork today, you will receive an invoice from [employer]. Please contact [facility] at [phone] with questions or concerns regarding your invoice.   Our billing staff will not be able to assist you with questions regarding bills from these companies.  You will be contacted with the lab results as soon as they are available. The fastest way to get your results is to activate your My Chart account. Instructions are located on the last page of this paperwork. If you have not heard from [place] regarding the results in 2 weeks, please contact this office.

## 2016-02-12 NOTE — Progress Notes (Signed)
Subjective:  By signing my name below, I, [name], attest that this documentation has been prepared under the direction and in the presence of [name], MD. Electronically Signed: [name], [place]. 02/12/2016 , 8:32 AM .  Patient was seen in Room 10 .   Patient ID: [name], male    DOB: 22-Jan-1934, 82 y.o.   MRN: [id number] Chief Complaint  Patient presents with  . Follow-up    RIBS with CXR   HPI [name] is a 82 y.o. male   Pleural effusion See prior notes; he's required thoracentesis x2, recently stable.   Patient states he's still having a throbbing pain when he's laying down. He's still taking his pain medication (hydrocodone), 1~2 a day. He mentions occasional small cough, causing left chest wall soreness. He denies shortness of breath.   Anemia He has history of anemia with hema-positive stool. He was referred to GI, and will see after he returns from his cruise vacation. He leaves for his cruise vacation on Nov 19th.   Lab Results  Component Value Date   WBC 6.4 01/30/2016   HGB 11.1 (L) 01/30/2016   HCT 34.9 (L) 01/30/2016   MCV 77.7 (L) 01/30/2016   PLT 241 01/30/2016   Elevated creatinine His creatinine was improved last visit.   Elevated BP His BP was high during triage; patient admits haven't taken his medication this morning.   Smoking He's recently picked up cigarette smoking again for about 4~5 weeks, about 4 cigarettes a day. He reports being around someone that was smoking, and he thought he'd pick it back up. He quit smoking on his own cold [place] previously.   Patient Active Problem List   Diagnosis Date Noted  . Fall 12/14/2015  . Elevated serum creatinine 12/14/2015  . Ileus ([place]) 12/14/2015  . Multiple fractures of ribs of left side 12/12/2015  . Blood in the urine 12/01/2013  . Cystitis, radiation 12/01/2013  . Malignant neoplasm of prostate ([place]) 11/26/2013  . Pulsatile tinnitus 11/19/2013  . Rectal  bleeding 12/23/2012  . Type II or unspecified type diabetes mellitus without mention of complication, not stated as uncontrolled   . Essential hypertension, benign   . Chronic back pain   . ED (erectile dysfunction)   . Anemia   . Hyperlipidemia   . CAD, NATIVE VESSEL 05/08/2008  . VENTRICULAR TACHYCARDIA 05/08/2008  . PREMATURE VENTRICULAR CONTRACTIONS 05/08/2008   Past Medical History:  Diagnosis Date  . Allergy   . CAD (coronary artery disease)    pci to LAD 10/09; stable CAD by cath 05/31/08; myoview 04/11/11- no ishcemia, EF 67%; echo 05/15/09- EF>55%, mod calcification of the aortic valve leaflets  . Chronic back pain   . ED (erectile dysfunction)   . Essential hypertension, benign   . Hyperlipidemia   . Multiple rib fractures    left 9th, 10th, and 11th posterior rib fractures S/P fall 12/09/2015/notes 12/12/2015  . Prostate cancer ([place])    s/p  . Type II or unspecified type diabetes mellitus without mention of complication, not stated as uncontrolled   . Ventricular tachycardia ([place])    resuscitated; monitor 05/2008; attempted T ablation 2/10- aborted due to inappropriate substrate   Past Surgical History:  Procedure Laterality Date  . CARDIAC CATHETERIZATION  05/31/08   EF 55-60%, stable lesions, medical therapy  . COLECTOMY  '80's   polyps  . CORONARY ANGIOPLASTY WITH STENT PLACEMENT  01/19/08   PCI stent to mid LAD with Promus DES  27.5x28  . IR GENERIC HISTORICAL  01/03/2016   IR THORACENTESIS ASP PLEURAL SPACE W/IMG GUIDE 01/03/2016 [facility]  . PTCA  01/2008   No Known Allergies Prior to Admission medications   Medication Sig Start Date End Date Taking? Authorizing Provider  atorvastatin (LIPITOR) 10 MG tablet Take 1 tablet (10 mg total) by mouth daily. 11/16/15  Yes [name], MD  ferrous sulfate 325 (65 FE) MG tablet Take 325 mg by mouth daily with breakfast.   Yes Historical Provider, MD  HYDROcodone-acetaminophen (NORCO/VICODIN) 5-325 MG tablet Take 1-2  tablets by mouth every 6 (six) hours as needed for moderate pain. 01/30/16  Yes [name], MD  insulin NPH-regular Human (NOVOLIN 70/30) (70-30) 100 UNIT/ML injection Inject 7 Units into the skin 2 (two) times daily with a meal. 11/16/15  Yes [name], MD  lisinopril-hydrochlorothiazide (PRINZIDE,ZESTORETIC) 20-12.5 MG tablet Take 1 tablet by mouth daily. 11/16/15  Yes [name], MD  metFORMIN (GLUCOPHAGE) 1000 MG tablet Take 1 tablet (1,000 mg total) by mouth 2 (two) times daily with a meal. 11/16/15  Yes [name], MD  metoprolol (LOPRESSOR) 50 MG tablet Take 1 tablet (50 mg total) by mouth 2 (two) times daily. 11/16/15  Yes [name], MD  Multiple Vitamin (MULTIVITAMIN) tablet Take 1 tablet by mouth daily.   Yes Historical Provider, MD  Omega-3 Fatty Acids (FISH OIL) 1000 MG CAPS Take 2 capsules by mouth daily.    Yes Historical Provider, MD  Vitamin D, Cholecalciferol, 1000 UNITS TABS Take 1 tablet by mouth daily.    Yes Historical Provider, MD  glucose blood (TRUETEST TEST) test strip USE AS DIRECTED THREE TIMES DAILY 06/21/13   [name], MD  Insulin Syringe-Needle U-100 (B-D INS SYR HALF-UNIT .3CC/31G) 31G X 5/16" 0.3 ML MISC Use daily at bedtime to inject insulin. Dx code: 250.00 06/21/13   [name], MD  sildenafil (VIAGRA) 50 MG tablet Take 1 tablet (50 mg total) by mouth daily as needed for erectile dysfunction. Patient not taking: Reported on 02/12/2016 02/08/15   Lorretta Harp, MD   Social History   Social History  . Marital status: Legally Separated    Spouse name: [name]  . Number of children: 7  . Years of education: [name]   Occupational History  . RETIRED Retired   Social History Main Topics  . Smoking status: Current Some Day Smoker    Packs/day: 0.50    Years: 15.00    Types: Cigarettes    Last attempt to quit: 09/01/1990  . Smokeless tobacco: Never Used     Comment: quit 12/07/2013  . Alcohol use No  . Drug use: No  . Sexual  activity: No   Other Topics Concern  . Not on file   Social History Narrative   29 grandchildren. Single. Education: 12 th grade. Exercise: 3-4 times a week for 30 minutes working out and set ups.   Review of Systems  Constitutional: Negative for fatigue and unexpected weight change.  Eyes: Negative for visual disturbance.  Respiratory: Positive for cough and chest tightness. Negative for shortness of breath and wheezing.   Cardiovascular: Positive for chest pain (discomfort). Negative for palpitations and leg swelling.  Gastrointestinal: Negative for abdominal pain and blood in stool.  Neurological: Negative for dizziness, light-headedness and headaches.       Objective:   Physical Exam  Constitutional: He is oriented to person, place, and time. He appears well-developed and well-nourished.  HENT:  Head: Normocephalic and atraumatic.  Eyes: EOM are normal. Pupils are equal, round, and reactive to light.  Neck: No JVD present. Carotid bruit is not present.  Cardiovascular: Normal rate, regular rhythm and normal heart sounds.   No murmur heard. Pulmonary/Chest: Effort normal. He has decreased breath sounds. He has no rales.  Very minimally decreased breath sounds L>R, left lateral chest wall slightly tender  Musculoskeletal: He exhibits no edema.  Neurological: He is alert and oriented to person, place, and time.  Skin: Skin is warm and dry.  Psychiatric: He has a normal mood and affect.  Vitals reviewed.   Vitals:   02/12/16 0759 02/12/16 0804  BP: (!) 172/76 (!) 160/68  Pulse: 86   Resp: 16   Temp: 98.2 F (36.8 C)   TempSrc: Oral   SpO2: 98%   Weight: 218 lb 9.6 oz (99.2 kg)   Height: 5' 9.25" (1.759 m)    Dg Chest 2 View  Result Date: 02/12/2016 CLINICAL DATA:  Left pleural effusion EXAM: CHEST  2 VIEW COMPARISON:  January 17, 2016 FINDINGS: Rib fractures are again noted on the left without pneumothorax. There is a stable left pleural effusion with atelectasis in  left base. The right lung is clear. The heart size and pulmonary vascularity are normal. No adenopathy. There is atherosclerotic calcification in the aorta. No new bone lesions are evident. IMPRESSION: Rib fractures again noted on the left. Persistent left base atelectasis with effusion. No new opacity. No pneumothorax evident. There is aortic atherosclerosis. Electronically Signed   By: Lowella Grip III M.D.   On: 02/12/2016 08:41      Assessment & Plan:   HAYTHAM MAHER is a 82 y.o. male Closed fracture of multiple ribs of left side with routine healing, subsequent encounter - Plan: DG Chest 2 View, HYDROcodone-acetaminophen (NORCO/VICODIN) 5-325 MG tablet Pleural effusion - Plan: DG Chest 2 View Chest wall pain Left-sided chest wall pain - Plan: HYDROcodone-acetaminophen (NORCO/VICODIN) 5-325 MG tablet Acute left flank pain - Plan: HYDROcodone-acetaminophen (NORCO/VICODIN) 5-325 MG tablet  - Multiple left-sided rib fractures from initial injury on 12/09/15. Subsequent left-sided pleural effusion requiring thoracentesis 2. Has remained stable recently. Discussed with trauma surgeon on October 13.. Stable or slightly improved at that time, and did warn of empyema, but if any symptoms, continue to monitor. Considering thoracic surgery evaluation if fluid persists, but currently stable. RTC precautions discussed if any new symptoms.  -Continue hydrocodone if needed for breakthrough pain, this should also lessen as rib fractures heal.  -rtc precautions.   Anemia, unspecified type - Plan: CBC with Differential/Platelet  - With heme positive stool. Hematology eval planned within next month. We'll plan on recheck CBC today to verify stability  Tobacco abuse  - Advised on need for cessation, especially with concurrent pleural effusion, rib fractures, heart disease and prostate cancer in the past. Resources provided, RTC precautions.   Meds ordered this encounter  Medications  .  HYDROcodone-acetaminophen (NORCO/VICODIN) 5-325 MG tablet    Sig: Take 1-2 tablets by mouth every 6 (six) hours as needed for moderate pain.    Dispense:  20 tablet    Refill:  0   Patient Instructions   For anemia, and blood that was noticed in the stool, follow-up with gastroenterology as planned.  Return to the clinic or go to the nearest emergency room if any of your symptoms worsen or new symptoms occur.  There is still some fluid in the left lung, but again it appears  stable. Okay to continue hydrocodone only if needed for pain. If any increased pain, increased cough, shortness of breath, or any new or worsening symptoms prior to your cruise, return for recheck. You should be continuing to improve before you take that cruise.   It is very important you quit smoking, especially with other medical issues currently. [facility] offers smoking cessation clinics. Registration is required. To register call [phone] or register online at [email or web address]  Smoking Cessation, Tips for Success If you are ready to quit smoking, congratulations! You have chosen to help yourself be healthier. Cigarettes bring nicotine, tar, carbon monoxide, and other irritants into your body. Your lungs, heart, and blood vessels will be able to work better without these poisons. There are many different ways to quit smoking. Nicotine gum, nicotine patches, a nicotine inhaler, or nicotine nasal spray can help with physical craving. Hypnosis, support groups, and medicines help break the habit of smoking. WHAT THINGS CAN I DO TO MAKE QUITTING EASIER?  Here are some tips to help you quit for good:  Pick a date when you will quit smoking completely. Tell all of your friends and family about your plan to quit on that date.  Do not try to slowly cut down on the number of cigarettes you are smoking. Pick a quit date and quit smoking completely starting on that day.  Throw away all cigarettes.   Clean and remove all  ashtrays from your home, work, and car.  On a card, write down your reasons for quitting. Carry the card with you and read it when you get the urge to smoke.  Cleanse your body of nicotine. Drink enough water and fluids to keep your urine clear or pale yellow. Do this after quitting to flush the nicotine from your body.  Learn to predict your moods. Do not let a bad situation be your excuse to have a cigarette. Some situations in your life might tempt you into wanting a cigarette.  Never have "just one" cigarette. It leads to wanting another and another. Remind yourself of your decision to quit.  Change habits associated with smoking. If you smoked while driving or when feeling stressed, try other activities to replace smoking. Stand up when drinking your coffee. Brush your teeth after eating. Sit in a different chair when you read the paper. Avoid alcohol while trying to quit, and try to drink fewer caffeinated beverages. Alcohol and caffeine may urge you to smoke.  Avoid foods and drinks that can trigger a desire to smoke, such as sugary or spicy foods and alcohol.  Ask people who smoke not to smoke around you.  Have something planned to do right after eating or having a cup of coffee. For example, plan to take a walk or exercise.  Try a relaxation exercise to calm you down and decrease your stress. Remember, you may be tense and nervous for the first 2 weeks after you quit, but this will pass.  Find new activities to keep your hands busy. Play with a pen, coin, or rubber band. Doodle or draw things on paper.  Brush your teeth right after eating. This will help cut down on the craving for the taste of tobacco after meals. You can also try mouthwash.   Use oral substitutes in place of cigarettes. Try using lemon drops, carrots, cinnamon sticks, or chewing gum. Keep them handy so they are available when you have the urge to smoke.  When you have the urge to smoke, try deep  breathing.  Designate your home as a nonsmoking area.  If you are a heavy smoker, ask your health care provider about a prescription for nicotine chewing gum. It can ease your withdrawal from nicotine.  Reward yourself. Set aside the cigarette money you save and buy yourself something nice.  Look for support from others. Join a support group or smoking cessation program. Ask someone at home or at work to help you with your plan to quit smoking.  Always ask yourself, "Do I need this cigarette or is this just a reflex?" Tell yourself, "Today, I choose not to smoke," or "I do not want to smoke." You are reminding yourself of your decision to quit.  Do not replace cigarette smoking with electronic cigarettes (commonly called e-cigarettes). The safety of e-cigarettes is unknown, and some may contain harmful chemicals.  If you relapse, do not give up! Plan ahead and think about what you will do the next time you get the urge to smoke. HOW WILL I FEEL WHEN I QUIT SMOKING? You may have symptoms of withdrawal because your body is used to nicotine (the addictive substance in cigarettes). You may crave cigarettes, be irritable, feel very hungry, cough often, get headaches, or have difficulty concentrating. The withdrawal symptoms are only temporary. They are strongest when you first quit but will go away within 10-14 days. When withdrawal symptoms occur, stay in control. Think about your reasons for quitting. Remind yourself that these are signs that your body is healing and getting used to being without cigarettes. Remember that withdrawal symptoms are easier to treat than the major diseases that smoking can cause.  Even after the withdrawal is over, expect periodic urges to smoke. However, these cravings are generally short lived and will go away whether you smoke or not. Do not smoke! WHAT RESOURCES ARE AVAILABLE TO HELP ME QUIT SMOKING? Your health care provider can direct you to community resources or  hospitals for support, which may include:  Group support.  Education.  Hypnosis.  Therapy.   This information is not intended to replace advice given to you by your health care provider. Make sure you discuss any questions you have with your health care provider.   Document Released: 12/22/2003 Document Revised: 04/15/2014 Document Reviewed: 09/10/2012 Elsevier Interactive Patient Education 2016 [employer].     IF you received an x-ray today, you will receive an invoice from [facility]. Please contact [facility] at [phone] with questions or concerns regarding your invoice.   IF you received labwork today, you will receive an invoice from [employer]. Please contact [facility] at [phone] with questions or concerns regarding your invoice.   Our billing staff will not be able to assist you with questions regarding bills from these companies.  You will be contacted with the lab results as soon as they are available. The fastest way to get your results is to activate your My Chart account. Instructions are located on the last page of this paperwork. If you have not heard from [place] regarding the results in 2 weeks, please contact this office.       I personally performed the services described in this documentation, which was scribed in my presence. The recorded information has been reviewed and considered, and addended by me as needed.   Signed,   [name], MD [facility].  02/12/16 8:52 AM

## 2016-02-19 DIAGNOSIS — M25561 Pain in right knee: Secondary | ICD-10-CM | POA: Diagnosis not present

## 2016-02-19 DIAGNOSIS — G8929 Other chronic pain: Secondary | ICD-10-CM | POA: Diagnosis not present

## 2016-02-19 DIAGNOSIS — M25562 Pain in left knee: Secondary | ICD-10-CM | POA: Diagnosis not present

## 2016-02-19 DIAGNOSIS — M1712 Unilateral primary osteoarthritis, left knee: Secondary | ICD-10-CM | POA: Diagnosis not present

## 2016-02-19 DIAGNOSIS — M1711 Unilateral primary osteoarthritis, right knee: Secondary | ICD-10-CM | POA: Diagnosis not present

## 2016-02-21 ENCOUNTER — Ambulatory Visit (INDEPENDENT_AMBULATORY_CARE_PROVIDER_SITE_OTHER): Payer: Commercial Managed Care - HMO | Admitting: Cardiovascular Disease

## 2016-02-21 ENCOUNTER — Encounter: Payer: Self-pay | Admitting: Cardiovascular Disease

## 2016-02-21 DIAGNOSIS — I10 Essential (primary) hypertension: Secondary | ICD-10-CM | POA: Diagnosis not present

## 2016-02-21 DIAGNOSIS — I2510 Atherosclerotic heart disease of native coronary artery without angina pectoris: Secondary | ICD-10-CM | POA: Diagnosis not present

## 2016-02-21 DIAGNOSIS — E7800 Pure hypercholesterolemia, unspecified: Secondary | ICD-10-CM

## 2016-02-21 NOTE — Progress Notes (Signed)
02/21/2016 [name]   11-09-1933  [id number]  Primary Physician [name], MD Primary Cardiologist: Lorretta Harp MD [name]  HPI:  The patient is a 82 year old moderately overweight, recently remarried for the third time (09/30/14 African American male, father of 3 children and 36 stepchildren, grandfather to 38 grandchildren, who is formerly a patient of Dr. Francine Acosta. I last saw him 02/08/15. He has a history of CAD status post LAD stenting with a Promus drug-eluting stent, October of 2009. He had attempt at VT ablation by Dr. Thompson Grayer, February 2010; however, this was aborted because of inappropriate substrate. His other problems include hypertension, hyperlipidemia, diabetes, as well as history of prostate cancer. Since I saw him, he has been asymptomatic. His last Myoview performed 04/11/11 was completely normal. He did stop his aspirin because of rectal bleeding is noticed increased evening flushing with Niaspan. His most recent lipid profile performed 08/30/15 revealed total cholesterol of 36, LDL 68 and HDL 48. He did fall down and crack 3 of his ribs several weeks ago and is in significant amount of pain. It sounds like he had a thoracentesis subsequent to that.   Current Outpatient Prescriptions  Medication Sig Dispense Refill  . atorvastatin (LIPITOR) 10 MG tablet Take 1 tablet (10 mg total) by mouth daily. 90 tablet 1  . ferrous sulfate 325 (65 FE) MG tablet Take 325 mg by mouth daily with breakfast.    . glucose blood (TRUETEST TEST) test strip USE AS DIRECTED THREE TIMES DAILY 100 each 3  . HYDROcodone-acetaminophen (NORCO/VICODIN) 5-325 MG tablet Take 1-2 tablets by mouth every 6 (six) hours as needed for moderate pain. 20 tablet 0  . insulin NPH-regular Human (NOVOLIN 70/30) (70-30) 100 UNIT/ML injection Inject 7 Units into the skin 2 (two) times daily with a meal. 10 mL 2  . Insulin Syringe-Needle U-100 (B-D INS SYR HALF-UNIT .3CC/31G) 31G X  5/16" 0.3 ML MISC Use daily at bedtime to inject insulin. Dx code: 250.00 100 each 3  . lisinopril-hydrochlorothiazide (PRINZIDE,ZESTORETIC) 20-12.5 MG tablet Take 1 tablet by mouth daily. 90 tablet 1  . metFORMIN (GLUCOPHAGE) 1000 MG tablet Take 1 tablet (1,000 mg total) by mouth 2 (two) times daily with a meal. 180 tablet 1  . metoprolol (LOPRESSOR) 50 MG tablet Take 1 tablet (50 mg total) by mouth 2 (two) times daily. 180 tablet 1  . Multiple Vitamin (MULTIVITAMIN) tablet Take 1 tablet by mouth daily.    . Omega-3 Fatty Acids (FISH OIL) 1000 MG CAPS Take 2 capsules by mouth daily.     . sildenafil (VIAGRA) 50 MG tablet Take 1 tablet (50 mg total) by mouth daily as needed for erectile dysfunction. 10 tablet 0  . Vitamin D, Cholecalciferol, 1000 UNITS TABS Take 1 tablet by mouth daily.      No current facility-administered medications for this visit.     No Known Allergies  Social History   Social History  . Marital status: Legally Separated    Spouse name: [name]  . Number of children: 7  . Years of education: [name]   Occupational History  . RETIRED Retired   Social History Main Topics  . Smoking status: Current Some Day Smoker    Packs/day: 0.50    Years: 15.00    Types: Cigarettes    Last attempt to quit: 09/01/1990  . Smokeless tobacco: Never Used     Comment: quit 12/07/2013  . Alcohol use No  . Drug use:  No  . Sexual activity: No   Other Topics Concern  . Not on file   Social History Narrative   29 grandchildren. Single. Education: 12 th grade. Exercise: 3-4 times a week for 30 minutes working out and set ups.     Review of Systems: General: negative for chills, fever, night sweats or weight changes.  Cardiovascular: negative for chest pain, dyspnea on exertion, edema, orthopnea, palpitations, paroxysmal nocturnal dyspnea or shortness of breath Dermatological: negative for rash Respiratory: negative for cough or wheezing Urologic: negative for hematuria Abdominal:  negative for nausea, vomiting, diarrhea, bright red blood per rectum, melena, or hematemesis Neurologic: negative for visual changes, syncope, or dizziness All other systems reviewed and are otherwise negative except as noted above.    Blood pressure (!) 164/68, pulse 78, height 6' (1.829 m), weight 223 lb 3.2 oz (101.2 kg), SpO2 95 %.  General appearance: alert and no distress Neck: no adenopathy, no carotid bruit, no JVD, supple, symmetrical, trachea midline and thyroid not enlarged, symmetric, no tenderness/mass/nodules Lungs: clear to auscultation bilaterally Heart: regular rate and rhythm, S1, S2 normal, no murmur, click, rub or gallop Extremities: extremities normal, atraumatic, no cyanosis or edema  EKG not performed today  ASSESSMENT AND PLAN:   CAD, NATIVE VESSEL History of CAD status post LAD stenting with a Promus drug-eluting stent October 2009. His last Myoview performed 04/11/11 was normal and nonischemic. He denies ischemic chest pain or shortness of breath.  Essential hypertension, benign History of hypertension blood pressure measured today at 164/68. Blood pressure somewhat elevated because of significant pain that he is in from cracked ribs. He is on lisinopril, hydrochlorothiazide and metoprolol. Continue current meds at current dosing  Hyperlipidemia History of hyperlipidemia on statin therapy with recent lipid profile performed 08/30/15 revealed total cholesterol 136, LDL 68 and HDL of [place] MD [facility] 02/21/2016 1:47 PM

## 2016-02-21 NOTE — Patient Instructions (Signed)

## 2016-02-21 NOTE — Assessment & Plan Note (Signed)
History of hypertension blood pressure measured today at 164/68. Blood pressure somewhat elevated because of significant pain that he is in from cracked ribs. He is on lisinopril, hydrochlorothiazide and metoprolol. Continue current meds at current dosing

## 2016-02-21 NOTE — Assessment & Plan Note (Signed)
History of hyperlipidemia on statin therapy with recent lipid profile performed 08/30/15 revealed total cholesterol 136, LDL 68 and HDL of 40

## 2016-02-21 NOTE — Assessment & Plan Note (Signed)
History of CAD status post LAD stenting with a Promus drug-eluting stent October 2009. His last Myoview performed 04/11/11 was normal and nonischemic. He denies ischemic chest pain or shortness of breath.

## 2016-02-22 ENCOUNTER — Ambulatory Visit: Payer: Commercial Managed Care - HMO | Admitting: Family Medicine

## 2016-02-22 ENCOUNTER — Encounter: Payer: Self-pay | Admitting: Family Medicine

## 2016-02-22 ENCOUNTER — Ambulatory Visit (INDEPENDENT_AMBULATORY_CARE_PROVIDER_SITE_OTHER): Payer: Commercial Managed Care - HMO | Admitting: Family Medicine

## 2016-02-22 ENCOUNTER — Ambulatory Visit (INDEPENDENT_AMBULATORY_CARE_PROVIDER_SITE_OTHER): Payer: Commercial Managed Care - HMO

## 2016-02-22 VITALS — BP 164/76 | HR 77 | Temp 98.7°F | Resp 16 | Ht 69.0 in | Wt 219.0 lb

## 2016-02-22 DIAGNOSIS — D649 Anemia, unspecified: Secondary | ICD-10-CM

## 2016-02-22 DIAGNOSIS — S2242XD Multiple fractures of ribs, left side, subsequent encounter for fracture with routine healing: Secondary | ICD-10-CM

## 2016-02-22 DIAGNOSIS — I10 Essential (primary) hypertension: Secondary | ICD-10-CM | POA: Diagnosis not present

## 2016-02-22 DIAGNOSIS — S2242XA Multiple fractures of ribs, left side, initial encounter for closed fracture: Secondary | ICD-10-CM | POA: Diagnosis not present

## 2016-02-22 DIAGNOSIS — Z794 Long term (current) use of insulin: Secondary | ICD-10-CM

## 2016-02-22 DIAGNOSIS — J90 Pleural effusion, not elsewhere classified: Secondary | ICD-10-CM

## 2016-02-22 DIAGNOSIS — E1122 Type 2 diabetes mellitus with diabetic chronic kidney disease: Secondary | ICD-10-CM

## 2016-02-22 DIAGNOSIS — R0789 Other chest pain: Secondary | ICD-10-CM | POA: Diagnosis not present

## 2016-02-22 LAB — HEMOGLOBIN A1C
Hgb A1c MFr Bld: 6.8 % — ABNORMAL HIGH (ref <5.7)
Mean Plasma Glucose: 148 mg/dL

## 2016-02-22 LAB — CBC
HCT: 37.7 % — ABNORMAL LOW (ref 38.5–50.0)
Hemoglobin: 11.8 g/dL — ABNORMAL LOW (ref 13.2–17.1)
MCH: 24.4 pg — ABNORMAL LOW (ref 27.0–33.0)
MCHC: 31.3 g/dL — ABNORMAL LOW (ref 32.0–36.0)
MCV: 77.9 fL — ABNORMAL LOW (ref 80.0–100.0)
MPV: 10.3 fL (ref 7.5–12.5)
Platelets: 285 10*3/uL (ref 140–400)
RBC: 4.84 MIL/uL (ref 4.20–5.80)
RDW: 17 % — ABNORMAL HIGH (ref 11.0–15.0)
WBC: 10.2 10*3/uL (ref 3.8–10.8)

## 2016-02-22 MED ORDER — HYDROCODONE-ACETAMINOPHEN 5-325 MG PO TABS: 1.0000 | ORAL_TABLET | Freq: Four times a day (QID) | ORAL | 0 refills | Status: DC | PRN

## 2016-02-22 NOTE — Progress Notes (Signed)
By signing my name below, I, [name], attest that this documentation has been prepared under the direction and in the presence of [name], MD.  Electronically Signed: [name], Medical Scribe. 02/22/16. 8:23 AM.  Subjective:    Patient ID: [name], male    DOB: [birth year], 82 y.o.   MRN: [id number]  HPI Chief Complaint  Patient presents with  . Follow-up    left ribs injury    HPI Comments: [name] is a 82 y.o. male who presents to the [facility] for follow-up. See most recent visits.   Chest Wall Pain: He had a fracture of multiple ribs, left side, on Sept 2nd; subsequent plural effusion, initially requiring hospital admission and two episodes of thoracentesis. Last x-ray on 11/6 showed persistent left atelectasis with effusion, stable. He was still requiring hydrocodone PRN last visit; 1-2 per day. Pt hasn't smoked since last time. Takes 1 hydrocodone a day for his chest wall pain, and occasionally 2 depending on the severity that day. Reports "a little" SOB after taking a walk compared to the last time he came in for SOB. Denies fevers, and new cough.  HTN with CAD: On metoprolol, lisinopril-HCTZ. Does takes lipitor for HLD. Dr. Alvester Chou is his cardiologist. His bp was 164/68 at his cardiologist visit; however, his bp was controlled at his visit in Oct. Lab Results  Component Value Date   CHOL 136 08/30/2015   HDL 48 08/30/2015   LDLCALC 68 08/30/2015   TRIG 102 08/30/2015   CHOLHDL 2.8 08/30/2015   Anemia: Planned on seeing GI for follow-up, as hemoglobin positive stool. Hemoglobin stable at last visit at 11.7.  Renal Insufficiency: That was improved from 1 month ago. Lab Results  Component Value Date   CREATININE 1.27 (H) 01/30/2016   DM w/hx of renal insufficiency and cardiac dx: He saw his cardiologist yesterday. No change in regimen. He takes Metformin 1000mg  BID and 70/30 insulin 7 units a day. He was advised to inc  his insulin to 8 units a day, continue same dose of metformin. He checks his blood sugar regularly and it reads between 110-120; never over 200 or under 60. Last went to ophthalmologist last year, and he hasn't been to his dentist recently.  Lab Results  Component Value Date   HGBA1C 8.2 (H) 11/16/2015   Lab Results  Component Value Date   MICROALBUR 83.3 05/17/2015   Wt Readings from Last 3 Encounters:  02/22/16 219 lb (99.3 kg)  02/21/16 223 lb 3.2 oz (101.2 kg)  02/12/16 218 lb 9.6 oz (99.2 kg)    Patient Active Problem List   Diagnosis Date Noted  . Fall 12/14/2015  . Elevated serum creatinine 12/14/2015  . Ileus ([place]) 12/14/2015  . Multiple fractures of ribs of left side 12/12/2015  . Blood in the urine 12/01/2013  . Cystitis, radiation 12/01/2013  . Malignant neoplasm of prostate ([place]) 11/26/2013  . Pulsatile tinnitus 11/19/2013  . Rectal bleeding 12/23/2012  . Type II or unspecified type diabetes mellitus without mention of complication, not stated as uncontrolled   . Essential hypertension, benign   . Chronic back pain   . ED (erectile dysfunction)   . Anemia   . Hyperlipidemia   . CAD, NATIVE VESSEL 05/08/2008  . VENTRICULAR TACHYCARDIA 05/08/2008  . PREMATURE VENTRICULAR CONTRACTIONS 05/08/2008   Past Medical History:  Diagnosis Date  . Allergy   . CAD (coronary artery disease)    pci to LAD 10/09;  stable CAD by cath 05/31/08; myoview 04/11/11- no ishcemia, EF 67%; echo 05/15/09- EF>55%, mod calcification of the aortic valve leaflets  . Chronic back pain   . ED (erectile dysfunction)   . Essential hypertension, benign   . Hyperlipidemia   . Multiple rib fractures    left 9th, 10th, and 11th posterior rib fractures S/P fall 12/09/2015/notes 12/12/2015  . Prostate cancer ([place])    s/p  . Type II or unspecified type diabetes mellitus without mention of complication, not stated as uncontrolled   . Ventricular tachycardia ([place])    resuscitated; monitor 05/2008;  attempted T ablation 2/10- aborted due to inappropriate substrate   Past Surgical History:  Procedure Laterality Date  . CARDIAC CATHETERIZATION  05/31/08   EF 55-60%, stable lesions, medical therapy  . COLECTOMY  '80's   polyps  . CORONARY ANGIOPLASTY WITH STENT PLACEMENT  01/19/08   PCI stent to mid LAD with Promus DES 27.5x28  . IR GENERIC HISTORICAL  01/03/2016   IR THORACENTESIS ASP PLEURAL SPACE W/IMG GUIDE 01/03/2016 [facility] RAD  . PTCA  01/2008   No Active Allergies Prior to Admission medications   Medication Sig Start Date End Date Taking? Authorizing Provider  atorvastatin (LIPITOR) 10 MG tablet Take 1 tablet (10 mg total) by mouth daily. 11/16/15  Yes [name], MD  ferrous sulfate 325 (65 FE) MG tablet Take 325 mg by mouth daily with breakfast.   Yes Historical Provider, MD  glucose blood (TRUETEST TEST) test strip USE AS DIRECTED THREE TIMES DAILY 06/21/13  Yes [name], MD  HYDROcodone-acetaminophen (NORCO/VICODIN) 5-325 MG tablet Take 1-2 tablets by mouth every 6 (six) hours as needed for moderate pain. 02/12/16  Yes [name], MD  insulin NPH-regular Human (NOVOLIN 70/30) (70-30) 100 UNIT/ML injection Inject 7 Units into the skin 2 (two) times daily with a meal. 11/16/15  Yes [name], MD  lisinopril-hydrochlorothiazide (PRINZIDE,ZESTORETIC) 20-12.5 MG tablet Take 1 tablet by mouth daily. 11/16/15  Yes [name], MD  metFORMIN (GLUCOPHAGE) 1000 MG tablet Take 1 tablet (1,000 mg total) by mouth 2 (two) times daily with a meal. 11/16/15  Yes [name], MD  metoprolol (LOPRESSOR) 50 MG tablet Take 1 tablet (50 mg total) by mouth 2 (two) times daily. 11/16/15  Yes [name], MD  Multiple Vitamin (MULTIVITAMIN) tablet Take 1 tablet by mouth daily.   Yes Historical Provider, MD  Omega-3 Fatty Acids (FISH OIL) 1000 MG CAPS Take 2 capsules by mouth daily.    Yes Historical Provider, MD  Vitamin D, Cholecalciferol, 1000 UNITS TABS Take 1  tablet by mouth daily.    Yes Historical Provider, MD  Insulin Syringe-Needle U-100 (B-D INS SYR HALF-UNIT .3CC/31G) 31G X 5/16" 0.3 ML MISC Use daily at bedtime to inject insulin. Dx code: 250.00 06/21/13   [name], MD  sildenafil (VIAGRA) 50 MG tablet Take 1 tablet (50 mg total) by mouth daily as needed for erectile dysfunction. Patient not taking: Reported on 02/22/2016 02/08/15   Lorretta Harp, MD   Social History   Social History  . Marital status: Legally Separated    Spouse name: [name]  . Number of children: 7  . Years of education: [name]   Occupational History  . RETIRED Retired   Social History Main Topics  . Smoking status: Current Some Day Smoker    Packs/day: 0.50    Years: 15.00    Types: Cigarettes    Last attempt to quit:  09/01/1990  . Smokeless tobacco: Never Used     Comment: quit 12/07/2013  . Alcohol use No  . Drug use: No  . Sexual activity: No   Other Topics Concern  . Not on file   Social History Narrative   29 grandchildren. Single. Education: 12 th grade. Exercise: 3-4 times a week for 30 minutes working out and set ups.   Review of Systems  Constitutional: Negative for fatigue, fever and unexpected weight change.  Eyes: Negative for visual disturbance.  Respiratory: Positive for shortness of breath. Negative for cough and chest tightness.   Cardiovascular: Negative for chest pain, palpitations and leg swelling.  Gastrointestinal: Negative for abdominal pain and blood in stool.  Neurological: Negative for dizziness, light-headedness and headaches.   Objective:  Physical Exam  Constitutional: He is oriented to person, place, and time. He appears well-developed and well-nourished.  HENT:  Head: Normocephalic and atraumatic.  Eyes: EOM are normal. Pupils are equal, round, and reactive to light.  Neck: No JVD present. Carotid bruit is not present.  Cardiovascular: Normal rate, regular rhythm and normal heart sounds.  Exam reveals no friction  rub.   No murmur heard. Pulmonary/Chest: Effort normal. No respiratory distress. He has no wheezes. He has rales.  Still has some rales/ course breath sounds in his LLL, otherwise clear Chest: Skin intact. Tender along the left lateral rib margin  Musculoskeletal: He exhibits no edema.  Neurological: He is alert and oriented to person, place, and time.  Skin: Skin is warm and dry.  Psychiatric: He has a normal mood and affect.  Vitals reviewed.  BP (!) 164/76 (BP Location: Left Arm, Patient Position: Sitting, Cuff Size: Large)   Pulse 77   Temp 98.7 F (37.1 C) (Oral)   Resp 16   Ht 5\' 9"  (1.753 m)   Wt 219 lb (99.3 kg)   SpO2 97%   BMI 32.34 kg/m    Dg Chest 2 View  Result Date: 02/22/2016 CLINICAL DATA:  83 year old male with history of left-sided rib fractures and pleural effusion. EXAM: CHEST  2 VIEW COMPARISON:  Chest x-ray 02/12/2016. FINDINGS: Minimally displaced fractures of the posterior left eighth rib and lateral left ninth rib are again noted. Moderate left pleural effusion is similar to the prior study. Opacities in the base of the left hemithorax are also similar to the prior study, favored to predominantly reflect areas of subsegmental atelectasis although underlying airspace consolidation is not excluded. Right lung is clear. No right pleural effusion. No pneumothorax. No evidence of pulmonary edema. Heart size is normal. Mediastinal contours are within normal limits. Atherosclerosis in the thoracic aorta. IMPRESSION: 1. There has been little interval change in the radiographic appearance of the chest, with left-sided rib fractures, persistent moderate left pleural effusion and atelectasis in the base of the left lung, as detailed above. 2. Aortic atherosclerosis. Electronically Signed   By: Vinnie Langton M.D.   On: 02/22/2016 09:14    Assessment & Plan:  [name] is a 82 y.o. male Type 2 diabetes mellitus with chronic kidney disease, with long-term current use  of insulin, unspecified CKD stage ([place]) - Plan: Hemoglobin A1c  - Check A1c, anticipate increase of insulin dosing, but no changes for now.  Essential hypertension  - Recent evaluation with cardiology, thought to be somewhat due to chest wall pain. Previously had been controlled.   -continue same regimen for now, pain control with hydrocodone if needed.   Closed fracture of multiple ribs of left  side with routine healing, subsequent encounter - Plan: DG Chest 2 View Pleural effusion on left - Plan: DG Chest 2 View  - Persistent left-sided pleural effusion, possible loculated. Overall shortness of breath has improved, reassuring O2 sat, afebrile, no second or signs of infection at this time. Unfortunately no significant improvement of effusion.   -Discuss with pulmonary, will plan on CT chest with contrast when he returns from his cruise to determine if loculated versus free-flowing effusion. Then can refer to chest surgeon/CVTS to decide on other interventions including possible chest tube or VATS, but options and risk and be discussed with surgeon.  -ER/RTC precautions discussed if fever, increasing shortness of breath, or new/worsening symptoms.  -Continue hydrocodone as needed for pain control now, he is only requiring 1-2 per day at present. Refill placed.  Anemia, unspecified type - Plan: CBC  - Stable. Continue iron supplementation, follow-up planned with gastroenterology when he returns to town. ER/RTC precautions.  No orders of the defined types were placed in this encounter.  Patient Instructions    You will likely need to increase your insulin dosing, but we can wait until that result is back. As of last visit your A1c was still too high. I will let you know when I receive those results. Depending on the appearance of your chest x-ray today, I may need to discuss the plan with the specialist as we have done before. I will let you know if anything changes before your trip. Okay to  continue hydrocodone up to every 6 hours as needed for more severe pain. If you have any increased shortness of breath or worsening pain, be seen by medical provider.  Monitor your blood pressure outside of the office, and if remaining over 140/90 in the next week or 2, return to discuss medication changes.   IF you received an x-ray today, you will receive an invoice from [facility]. Please contact [facility] at [phone] with questions or concerns regarding your invoice.   IF you received labwork today, you will receive an invoice from [employer]. Please contact [facility] at [phone] with questions or concerns regarding your invoice.   Our billing staff will not be able to assist you with questions regarding bills from these companies.  You will be contacted with the lab results as soon as they are available. The fastest way to get your results is to activate your My Chart account. Instructions are located on the last page of this paperwork. If you have not heard from [place] regarding the results in 2 weeks, please contact this office.       I personally performed the services described in this documentation, which was scribed in my presence. The recorded information has been reviewed and considered, and addended by me as needed.   Signed,   [name], MD [facility].  02/22/16 2:57 PM

## 2016-02-22 NOTE — Patient Instructions (Addendum)
  You will likely need to increase your insulin dosing, but we can wait until that result is back. As of last visit your A1c was still too high. I will let you know when I receive those results. Depending on the appearance of your chest x-ray today, I may need to discuss the plan with the specialist as we have done before. I will let you know if anything changes before your trip. Okay to continue hydrocodone up to every 6 hours as needed for more severe pain. If you have any increased shortness of breath or worsening pain, be seen by medical provider.  Monitor your blood pressure outside of the office, and if remaining over 140/90 in the next week or 2, return to discuss medication changes.   IF you received an x-ray today, you will receive an invoice from [facility]. Please contact [facility] at [phone] with questions or concerns regarding your invoice.   IF you received labwork today, you will receive an invoice from [employer]. Please contact [facility] at [phone] with questions or concerns regarding your invoice.   Our billing staff will not be able to assist you with questions regarding bills from these companies.  You will be contacted with the lab results as soon as they are available. The fastest way to get your results is to activate your My Chart account. Instructions are located on the last page of this paperwork. If you have not heard from [place] regarding the results in 2 weeks, please contact this office.

## 2016-03-11 ENCOUNTER — Ambulatory Visit: Payer: Commercial Managed Care - HMO

## 2016-03-11 ENCOUNTER — Telehealth: Payer: Self-pay | Admitting: Family Medicine

## 2016-03-11 ENCOUNTER — Telehealth: Payer: Self-pay

## 2016-03-11 NOTE — Telephone Encounter (Signed)
[facility] is calling because they haven't been able to reach patient to schedule an appointment. Patient's voicemail is full.   [facility]: [phone]

## 2016-03-11 NOTE — Telephone Encounter (Signed)
[facility] stating that they need pt weight and recent BMP so pt can have his CT

## 2016-03-12 NOTE — Telephone Encounter (Signed)
Appt. Dr horn 03/13/16 and dr Carlota Raspberry 03/14/16 He will call GI today, number given

## 2016-03-13 DIAGNOSIS — K627 Radiation proctitis: Secondary | ICD-10-CM | POA: Diagnosis not present

## 2016-03-13 DIAGNOSIS — D500 Iron deficiency anemia secondary to blood loss (chronic): Secondary | ICD-10-CM | POA: Diagnosis not present

## 2016-03-13 DIAGNOSIS — Z86010 Personal history of colonic polyps: Secondary | ICD-10-CM | POA: Diagnosis not present

## 2016-03-14 ENCOUNTER — Encounter: Payer: Self-pay | Admitting: Family Medicine

## 2016-03-14 ENCOUNTER — Ambulatory Visit (INDEPENDENT_AMBULATORY_CARE_PROVIDER_SITE_OTHER): Payer: Commercial Managed Care - HMO | Admitting: Family Medicine

## 2016-03-14 VITALS — BP 168/76 | HR 75 | Temp 97.9°F | Resp 16 | Ht 69.0 in | Wt 222.0 lb

## 2016-03-14 DIAGNOSIS — R0789 Other chest pain: Secondary | ICD-10-CM

## 2016-03-14 DIAGNOSIS — J90 Pleural effusion, not elsewhere classified: Secondary | ICD-10-CM | POA: Diagnosis not present

## 2016-03-14 DIAGNOSIS — S2242XD Multiple fractures of ribs, left side, subsequent encounter for fracture with routine healing: Secondary | ICD-10-CM | POA: Diagnosis not present

## 2016-03-14 MED ORDER — HYDROCODONE-ACETAMINOPHEN 5-325 MG PO TABS: 1.0000 | ORAL_TABLET | Freq: Four times a day (QID) | ORAL | 0 refills | Status: DC | PRN

## 2016-03-14 NOTE — Patient Instructions (Addendum)
  Keep appointment for CT scan. Once the sutures results, I will refer you to a specialist to determine if other treatments are needed. Keep follow-up as planned with Dr. Benson Norway, and follow-up with me for diabetes in the next approximately 2 months.  Let me know if you need a refill of your pain medication, but as pain improves, transition to just Tylenol as needed. Let me know if there are any questions in the meantime.   IF you received an x-ray today, you will receive an invoice from [facility]. Please contact [facility] at [phone] with questions or concerns regarding your invoice.   IF you received labwork today, you will receive an invoice from [employer]. Please contact [facility] at [phone] with questions or concerns regarding your invoice.   Our billing staff will not be able to assist you with questions regarding bills from these companies.  You will be contacted with the lab results as soon as they are available. The fastest way to get your results is to activate your My Chart account. Instructions are located on the last page of this paperwork. If you have not heard from [place] regarding the results in 2 weeks, please contact this office.

## 2016-03-14 NOTE — Progress Notes (Signed)
By signing my name below, I, [name], attest that this documentation has been prepared under the direction and in the presence of [name], MD.  Electronically Signed: [name], Medical Scribe. 03/14/16. 1:29 PM.  Subjective:    Patient ID: [name], male    DOB: 1933-12-30, 82 y.o.   MRN: [id number]  HPI Chief Complaint  Patient presents with  . Follow-up    rib injury    HPI Comments: [name] is a 82 y.o. male who presents to the [facility] for  follow-up.   Rib: Fracture of multiple left sided ribs on 12/09/15. Followed by pleural effusion, initially requiring hospitalization and 2 episodes of thoracentesis initially on 9/6th and again on 9/27th. Pain was improving as of last visit, but still managed with hydrocodone 1-2x a day. Minimal dyspnea on exertion at last visit. Repeat CXR on 11/16 indicating little interval change with left rib fracture and persistent moderate left pleural effusion as well as atelectasis left lung base. O2 stat was stable at that visit; SOB had improved. Discussed with pulmonary.    As he has now returned from a cruise, planned for a chest CT to determine if free flowing or loculated effusion. Then planned to refer to cardiovascular surgeon to decide on other treatment. Reports pain with deep breaths. Pt ran out of hydrocodone 2 days ago; during his cruise he had to take a little more hydrocodone than usual along with tylenol tablets Due to increased activity while on the cruise. Pt stopped smoking a month ago. Had only smoked a little bit at that time. Denies SOB, fever, and cough.  Reports feeling "woozy" for a sec and isn't sure if it's from his glasses. Resolves quickly, no persistent symptoms, no other neurologic symptoms.  Anemia: Stable at last visit. Saw Dr. Benson Norway and was told his blood count was then same as 6 years ago. GI told him he was to do another colonoscopy, but planning on deferring this until  his ribs are healed a little further.  Renal Insufficiency: Range over 2 months 1.27 - 1.58. Possible component of CKD with DM. Overall stable last visit Lab Results  Component Value Date   CREATININE 1.27 (H) 01/30/2016   DM: See last office visit on 02/22/16. A1c was controlled, so continued on metformin 1000 mg BID, and 7 units of 70/30 insulin BID. Compliant with insulin and metformin. No symptomatic lows.  Lab Results  Component Value Date   HGBA1C 6.8 (H) 02/22/2016   Patient Active Problem List   Diagnosis Date Noted  . Fall 12/14/2015  . Elevated serum creatinine 12/14/2015  . Ileus ([place]) 12/14/2015  . Multiple fractures of ribs of left side 12/12/2015  . Blood in the urine 12/01/2013  . Cystitis, radiation 12/01/2013  . Malignant neoplasm of prostate ([place]) 11/26/2013  . Pulsatile tinnitus 11/19/2013  . Rectal bleeding 12/23/2012  . Type II or unspecified type diabetes mellitus without mention of complication, not stated as uncontrolled   . Essential hypertension, benign   . Chronic back pain   . ED (erectile dysfunction)   . Anemia   . Hyperlipidemia   . CAD, NATIVE VESSEL 05/08/2008  . VENTRICULAR TACHYCARDIA 05/08/2008  . PREMATURE VENTRICULAR CONTRACTIONS 05/08/2008   Past Medical History:  Diagnosis Date  . Allergy   . CAD (coronary artery disease)    pci to LAD 10/09; stable CAD by cath 05/31/08; myoview 04/11/11- no ishcemia, EF 67%; echo 05/15/09- EF>55%, mod calcification  of the aortic valve leaflets  . Chronic back pain   . ED (erectile dysfunction)   . Essential hypertension, benign   . Hyperlipidemia   . Multiple rib fractures    left 9th, 10th, and 11th posterior rib fractures S/P fall 12/09/2015/notes 12/12/2015  . Prostate cancer ([place])    s/p  . Type II or unspecified type diabetes mellitus without mention of complication, not stated as uncontrolled   . Ventricular tachycardia ([place])    resuscitated; monitor 05/2008; attempted T ablation 2/10- aborted due  to inappropriate substrate   Past Surgical History:  Procedure Laterality Date  . CARDIAC CATHETERIZATION  05/31/08   EF 55-60%, stable lesions, medical therapy  . COLECTOMY  '80's   polyps  . CORONARY ANGIOPLASTY WITH STENT PLACEMENT  01/19/08   PCI stent to mid LAD with Promus DES 27.5x28  . IR GENERIC HISTORICAL  01/03/2016   IR THORACENTESIS ASP PLEURAL SPACE W/IMG GUIDE 01/03/2016 [facility]  . PTCA  01/2008   No Known Allergies Prior to Admission medications   Medication Sig Start Date End Date Taking? Authorizing Provider  atorvastatin (LIPITOR) 10 MG tablet Take 1 tablet (10 mg total) by mouth daily. 11/16/15  Yes [name], MD  ferrous sulfate 325 (65 FE) MG tablet Take 325 mg by mouth daily with breakfast.   Yes Historical Provider, MD  glucose blood (TRUETEST TEST) test strip USE AS DIRECTED THREE TIMES DAILY 06/21/13  Yes [name], MD  HYDROcodone-acetaminophen (NORCO/VICODIN) 5-325 MG tablet Take 1-2 tablets by mouth every 6 (six) hours as needed for moderate pain. 02/22/16  Yes [name], MD  insulin NPH-regular Human (NOVOLIN 70/30) (70-30) 100 UNIT/ML injection Inject 7 Units into the skin 2 (two) times daily with a meal. 11/16/15  Yes [name], MD  Insulin Syringe-Needle U-100 (B-D INS SYR HALF-UNIT .3CC/31G) 31G X 5/16" 0.3 ML MISC Use daily at bedtime to inject insulin. Dx code: 250.00 06/21/13  Yes [name], MD  lisinopril-hydrochlorothiazide (PRINZIDE,ZESTORETIC) 20-12.5 MG tablet Take 1 tablet by mouth daily. 11/16/15  Yes [name], MD  metFORMIN (GLUCOPHAGE) 1000 MG tablet Take 1 tablet (1,000 mg total) by mouth 2 (two) times daily with a meal. 11/16/15  Yes [name], MD  metoprolol (LOPRESSOR) 50 MG tablet Take 1 tablet (50 mg total) by mouth 2 (two) times daily. 11/16/15  Yes [name], MD  Multiple Vitamin (MULTIVITAMIN) tablet Take 1 tablet by mouth daily.   Yes Historical Provider, MD  Omega-3 Fatty Acids  (FISH OIL) 1000 MG CAPS Take 2 capsules by mouth daily.    Yes Historical Provider, MD  Vitamin D, Cholecalciferol, 1000 UNITS TABS Take 1 tablet by mouth daily.    Yes Historical Provider, MD  sildenafil (VIAGRA) 50 MG tablet Take 1 tablet (50 mg total) by mouth daily as needed for erectile dysfunction. Patient not taking: Reported on 03/14/2016 02/08/15   Lorretta Harp, MD   Social History   Social History  . Marital status: Legally Separated    Spouse name: [name]  . Number of children: 7  . Years of education: [name]   Occupational History  . RETIRED Retired   Social History Main Topics  . Smoking status: Former Smoker    Packs/day: 0.50    Years: 15.00    Types: Cigarettes    Quit date: 09/01/1990  . Smokeless tobacco: Never Used     Comment: quit 12/07/2013  . Alcohol use No  .  Drug use: No  . Sexual activity: No   Other Topics Concern  . Not on file   Social History Narrative   29 grandchildren. Single. Education: 12 th grade. Exercise: 3-4 times a week for 30 minutes working out and set ups.   Review of Systems  Constitutional: Negative for fever.  Respiratory: Negative for cough and shortness of breath.   Neurological: Positive for dizziness.   Objective:  Physical Exam  Constitutional: He appears well-developed and well-nourished. No distress.  HENT:  Head: Normocephalic and atraumatic.  Eyes: Conjunctivae are normal.  Neck: Neck supple.  Cardiovascular: Normal rate, regular rhythm and normal heart sounds.  Exam reveals no gallop and no friction rub.   No murmur heard. Pulmonary/Chest: Effort normal. No respiratory distress. He has no wheezes. He has no rales. He exhibits tenderness.  Minimal course on LLL but I do hear air movement everywhere else Slight discomfort across left lateral chest wall  Neurological: He is alert.  Skin: Skin is warm and dry.  Psychiatric: He has a normal mood and affect. His behavior is normal.  Nursing note and vitals reviewed.  BP  (!) 168/76 (BP Location: Left Arm, Patient Position: Sitting, Cuff Size: Large)   Pulse 75   Temp 97.9 F (36.6 C)   Resp 16   Ht 5\' 9"  (1.753 m)   Wt 222 lb (100.7 kg)   SpO2 99%   BMI 32.78 kg/m  Assessment & Plan:   [name] is a 82 y.o. male Pleural effusion on left  Closed fracture of multiple ribs of left side with routine healing, subsequent encounter - Plan: HYDROcodone-acetaminophen (NORCO/VICODIN) 5-325 MG tablet  Left-sided chest wall pain - Plan: HYDROcodone-acetaminophen (NORCO/VICODIN) 5-325 MG tablet   History of multiple left rib fractures with subsequent pleural effusion as above. Persistent effusion on x-rays, discussed with pulmonary as above. Plan for CT chest next week, then referral to CVTS for evaluation and discussion of options. Symptoms overall stable, still requiring some hydrocodone for pain management. Refill hydrocodone for intermittent use, Tylenol for mild symptoms, RTC precautions.  Continue follow-up with gastroenterology for history of anemia and he heme positive stool. By patient's report planning for colonoscopy early next year. May also have some component of anemia chronic disease with his renal insufficiency and underlying diabetes.  Plan for repeat diabetes evaluation and renal function testing at office visit in 2 months.     Meds ordered this encounter  Medications  . HYDROcodone-acetaminophen (NORCO/VICODIN) 5-325 MG tablet    Sig: Take 1-2 tablets by mouth every 6 (six) hours as needed for moderate pain.    Dispense:  20 tablet    Refill:  0   Patient Instructions    Keep appointment for CT scan. Once the sutures results, I will refer you to a specialist to determine if other treatments are needed. Keep follow-up as planned with Dr. Benson Norway, and follow-up with me for diabetes in the next approximately 2 months.  Let me know if you need a refill of your pain medication, but as pain improves, transition to just Tylenol as needed.  Let me know if there are any questions in the meantime.   IF you received an x-ray today, you will receive an invoice from [facility]. Please contact [facility] at [phone] with questions or concerns regarding your invoice.   IF you received labwork today, you will receive an invoice from [employer]. Please contact [facility] at [phone] with questions or concerns regarding  your invoice.   Our billing staff will not be able to assist you with questions regarding bills from these companies.  You will be contacted with the lab results as soon as they are available. The fastest way to get your results is to activate your My Chart account. Instructions are located on the last page of this paperwork. If you have not heard from [place] regarding the results in 2 weeks, please contact this office.        I personally performed the services described in this documentation, which was scribed in my presence. The recorded information has been reviewed and considered, and addended by me as needed.   Signed,   [name], MD [facility].  03/14/16 2:02 PM

## 2016-03-18 ENCOUNTER — Ambulatory Visit
Admission: RE | Admit: 2016-03-18 | Discharge: 2016-03-18 | Disposition: A | Discharge status: Home / Self Care | Payer: Commercial Managed Care - HMO | Source: Ambulatory Visit | Attending: Family Medicine | Admitting: Family Medicine

## 2016-03-18 DIAGNOSIS — J90 Pleural effusion, not elsewhere classified: Secondary | ICD-10-CM

## 2016-03-18 DIAGNOSIS — S2242XD Multiple fractures of ribs, left side, subsequent encounter for fracture with routine healing: Secondary | ICD-10-CM

## 2016-03-18 MED ORDER — IOPAMIDOL (ISOVUE-300) INJECTION 61%
75.0000 mL | Freq: Once | INTRAVENOUS | Status: AC | PRN
  Administered 2016-03-18: 75 mL via INTRAVENOUS

## 2016-03-19 ENCOUNTER — Ambulatory Visit (INDEPENDENT_AMBULATORY_CARE_PROVIDER_SITE_OTHER): Payer: Commercial Managed Care - HMO | Admitting: Family Medicine

## 2016-03-19 VITALS — BP 164/78 | HR 67 | Temp 98.0°F | Resp 16 | Ht 69.0 in | Wt 223.0 lb

## 2016-03-19 DIAGNOSIS — R221 Localized swelling, mass and lump, neck: Secondary | ICD-10-CM

## 2016-03-19 DIAGNOSIS — K115 Sialolithiasis: Secondary | ICD-10-CM

## 2016-03-19 DIAGNOSIS — Z91013 Allergy to seafood: Secondary | ICD-10-CM | POA: Diagnosis not present

## 2016-03-19 NOTE — Patient Instructions (Addendum)
  Based on the area of swelling, symptoms could be due to sialolithiasis or some swelling of your salivary glands, or less likely allergic reaction. I will check a food allergy test for shrimp, but we can also have you meet with allergist if needed. Warm compresses to affected areas tonight, Benadryl 1 pill every 6 hours, and if swelling has not improved tomorrow morning, return for recheck here with one of my colleagues. If any worsening overnight, go to emergency room.  I will look at CT results further to decide on next step.     IF you received an x-ray today, you will receive an invoice from [facility]. Please contact [facility] at [phone] with questions or concerns regarding your invoice.   IF you received labwork today, you will receive an invoice from [employer]. Please contact [facility] at [phone] with questions or concerns regarding your invoice.   Our billing staff will not be able to assist you with questions regarding bills from these companies.  You will be contacted with the lab results as soon as they are available. The fastest way to get your results is to activate your My Chart account. Instructions are located on the last page of this paperwork. If you have not heard from [place] regarding the results in 2 weeks, please contact this office.

## 2016-03-19 NOTE — Progress Notes (Signed)
Subjective:  By signing my name below, I, [name], attest that this documentation has been prepared under the direction and in the presence of Robert Ray, MD. Electronically Signed: [name], [place]. 03/19/2016 , 3:47 PM .  Patient was seen in Room 10 .   Patient ID: [name], male    DOB: 25-Oct-1933, 82 y.o.   MRN: [id number] Chief Complaint  Patient presents with  . Allergic Reaction    neck swollen/ pt thinks it may have been the shrimp he ate   HPI [name] is a 82 y.o. male [3:25PM] I was pulled by staff for possible allergic reaction, concerned for neck swelling. I saw him acutely in room 10; he was speaking normally in complete sentences without stridor, dyspnea, or wheezing and his lungs were clear. He denies tongue or lip swelling. Will return for further evaluation. Vitals were stable.   [3:33PM] Patient reports possible allergic reaction with neck swelling. He states he's eaten shrimp about 3 weeks ago and it upset his stomach. He denies lip swelling, tongue swelling, rash, or hives at that time. He informs having shrimp allergic reaction in his 30s. He's had shrimp every now and then since without any issues.   He had shrimp again 2 days ago and yesterday. He woke up this morning with puffiness around his neck and upset stomach, similar to his symptoms 30 years ago. His swelling has not progressed and has remains the same. He denies trouble swallowing, hives or rash.   He also had CT chest with contrast done last night. It showed a small pleural effusion and rib fractures slowly healing. He denies history of contrast allergies.   Patient Active Problem List   Diagnosis Date Noted  . Fall 12/14/2015  . Elevated serum creatinine 12/14/2015  . Ileus ([place]) 12/14/2015  . Multiple fractures of ribs of left side 12/12/2015  . Acosta in the urine 12/01/2013  . Cystitis, radiation 12/01/2013  . Malignant neoplasm of prostate ([place]) 11/26/2013  .  Pulsatile tinnitus 11/19/2013  . Rectal bleeding 12/23/2012  . Type II or unspecified type diabetes mellitus without mention of complication, not stated as uncontrolled   . Essential hypertension, benign   . Chronic back pain   . ED (erectile dysfunction)   . Anemia   . Hyperlipidemia   . CAD, NATIVE VESSEL 05/08/2008  . VENTRICULAR TACHYCARDIA 05/08/2008  . PREMATURE VENTRICULAR CONTRACTIONS 05/08/2008   Past Medical History:  Diagnosis Date  . Allergy   . CAD (coronary artery disease)    pci to LAD 10/09; stable CAD by cath 05/31/08; myoview 04/11/11- no ishcemia, EF 67%; echo 05/15/09- EF>55%, mod calcification of the aortic valve leaflets  . Chronic back pain   . ED (erectile dysfunction)   . Essential hypertension, benign   . Hyperlipidemia   . Multiple rib fractures    left 9th, 10th, and 11th posterior rib fractures S/P fall 12/09/2015/notes 12/12/2015  . Prostate cancer ([place])    s/p  . Type II or unspecified type diabetes mellitus without mention of complication, not stated as uncontrolled   . Ventricular tachycardia ([place])    resuscitated; monitor 05/2008; attempted T ablation 2/10- aborted due to inappropriate substrate   Past Surgical History:  Procedure Laterality Date  . CARDIAC CATHETERIZATION  05/31/08   EF 55-60%, stable lesions, medical therapy  . COLECTOMY  '80's   polyps  . CORONARY ANGIOPLASTY WITH STENT PLACEMENT  01/19/08   PCI stent to mid LAD with Promus  DES 27.5x28  . IR GENERIC HISTORICAL  01/03/2016   IR THORACENTESIS ASP PLEURAL SPACE W/IMG GUIDE 01/03/2016 [facility]  . PTCA  01/2008   No Known Allergies Prior to Admission medications   Medication Sig Start Date End Date Taking? Authorizing Provider  atorvastatin (LIPITOR) 10 MG tablet Take 1 tablet (10 mg total) by mouth daily. 11/16/15   [name], MD  ferrous sulfate 325 (65 FE) MG tablet Take 325 mg by mouth daily with breakfast.    Historical Provider, MD  glucose Acosta (TRUETEST TEST) test  strip USE AS DIRECTED THREE TIMES DAILY 06/21/13   [name], MD  HYDROcodone-acetaminophen (NORCO/VICODIN) 5-325 MG tablet Take 1-2 tablets by mouth every 6 (six) hours as needed for moderate pain. 03/14/16   [name], MD  insulin NPH-regular Human (NOVOLIN 70/30) (70-30) 100 UNIT/ML injection Inject 7 Units into the skin 2 (two) times daily with a meal. 11/16/15   [name], MD  Insulin Syringe-Needle U-100 (B-D INS SYR HALF-UNIT .3CC/31G) 31G X 5/16" 0.3 ML MISC Use daily at bedtime to inject insulin. Dx code: 250.00 06/21/13   [name], MD  lisinopril-hydrochlorothiazide (PRINZIDE,ZESTORETIC) 20-12.5 MG tablet Take 1 tablet by mouth daily. 11/16/15   [name], MD  metFORMIN (GLUCOPHAGE) 1000 MG tablet Take 1 tablet (1,000 mg total) by mouth 2 (two) times daily with a meal. 11/16/15   [name], MD  metoprolol (LOPRESSOR) 50 MG tablet Take 1 tablet (50 mg total) by mouth 2 (two) times daily. 11/16/15   [name], MD  Multiple Vitamin (MULTIVITAMIN) tablet Take 1 tablet by mouth daily.    Historical Provider, MD  Omega-3 Fatty Acids (FISH OIL) 1000 MG CAPS Take 2 capsules by mouth daily.     Historical Provider, MD  sildenafil (VIAGRA) 50 MG tablet Take 1 tablet (50 mg total) by mouth daily as needed for erectile dysfunction. Patient not taking: Reported on 03/14/2016 02/08/15   Lorretta Harp, MD  Vitamin D, Cholecalciferol, 1000 UNITS TABS Take 1 tablet by mouth daily.     Historical Provider, MD   Social History   Social History  . Marital status: Legally Separated    Spouse name: [name]  . Number of children: 7  . Years of education: [name]   Occupational History  . RETIRED Retired   Social History Main Topics  . Smoking status: Former Smoker    Packs/day: 0.50    Years: 15.00    Types: Cigarettes    Quit date: 09/01/1990  . Smokeless tobacco: Never Used     Comment: quit 12/07/2013  . Alcohol use No  . Drug use: No  . Sexual activity: No    Other Topics Concern  . Not on file   Social History Narrative   29 grandchildren. Single. Education: 12 th grade. Exercise: 3-4 times a week for 30 minutes working out and set ups.   Review of Systems  Constitutional: Negative for fatigue and unexpected weight change.  HENT: Negative for facial swelling, mouth sores and trouble swallowing.   Eyes: Negative for visual disturbance.  Respiratory: Negative for cough, chest tightness and shortness of breath.   Cardiovascular: Negative for chest pain, palpitations and leg swelling.  Gastrointestinal: Negative for abdominal pain, Acosta in stool, diarrhea, nausea and vomiting.  Skin: Negative for rash and wound.  Neurological: Negative for dizziness, light-headedness and headaches.  Hematological: Positive for adenopathy.       Objective:   Physical  Exam  Constitutional: He is oriented to person, place, and time. He appears well-developed and well-nourished. No distress.  HENT:  Head: Normocephalic and atraumatic.  no tongue swelling, uvula is midline  Eyes: EOM are normal. Pupils are equal, round, and reactive to light.  Neck: Neck supple.  Some prominence of submandibular glands bilaterally, as well as soft tissue swelling below mid- neck  Cardiovascular: Normal rate.   Pulmonary/Chest: Effort normal. No stridor. No respiratory distress. He has no wheezes.  Musculoskeletal: Normal range of motion.  Neurological: He is alert and oriented to person, place, and time.  Skin: Skin is warm and dry.  Psychiatric: He has a normal mood and affect. His behavior is normal.  Nursing note and vitals reviewed.   Vitals:   03/19/16 1520  BP: (!) 164/78  Pulse: 67  Resp: 16  Temp: 98 F (36.7 C)  TempSrc: Oral  SpO2: 98%  Weight: 223 lb (101.2 kg)  Height: 5\' 9"  (1.753 m)      Assessment & Plan:   [name] is a 82 y.o. 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 Based on location and bilateral symptoms, suspicious for sialolithiasis, but differential includes food allergy as reported similar symptoms with shrimp intake years ago. Has been able to eat shrimp in smaller quantities without any difficulty, but attributes recent symptoms to shrimp consumption within a few days of each other.  -Airway is clear, clearing secretions, no wheezing or stridor no hives.  -We'll have him treat with Benadryl 25 mg over-the-counter every 4-6 hours tonight then if any persistent swelling tomorrow to return for recheck.  -Will also order food allergy profile, but may also need allergist evaluation if that information is not helpful.  -ER precautions.  No orders of the defined types were placed in this encounter.  Patient Instructions    Based on the area of swelling, symptoms could be due to sialolithiasis or some swelling of your salivary glands, or less likely allergic reaction. I will check a food allergy test for shrimp, but we can also have you meet with allergist if needed. Warm compresses to affected areas tonight, Benadryl 1 pill every 6 hours, and if swelling has not improved tomorrow morning, return for recheck here with one of my colleagues. If any worsening overnight, go to emergency room.  I will look at CT results further to decide on next step.     IF you received an x-Acosta today, you will receive an invoice from [facility]. Please contact [facility] at [phone] with questions or concerns regarding your invoice.   IF you received labwork today, you will receive an invoice from [employer]. Please contact [facility] at [phone] with questions or concerns regarding your invoice.   Our billing staff will not be able to assist you with questions regarding bills from these companies.  You will be contacted  with the lab results as soon as they are available. The fastest way to get your results is to activate your My Chart account. Instructions are located on the last page of this paperwork. If you have not heard from [place] regarding the results in 2 weeks, please contact this office.        I personally performed the services described in this documentation, which was scribed in my presence. The recorded information has been reviewed and  considered, and addended by me as needed.   Signed,   Robert Ray, MD [facility].  03/20/16 5:13 PM

## 2016-03-22 ENCOUNTER — Ambulatory Visit (INDEPENDENT_AMBULATORY_CARE_PROVIDER_SITE_OTHER): Payer: Commercial Managed Care - HMO | Admitting: Family Medicine

## 2016-03-22 ENCOUNTER — Ambulatory Visit (INDEPENDENT_AMBULATORY_CARE_PROVIDER_SITE_OTHER): Payer: Commercial Managed Care - HMO

## 2016-03-22 ENCOUNTER — Other Ambulatory Visit: Payer: Self-pay | Admitting: Family Medicine

## 2016-03-22 VITALS — BP 130/74 | HR 76 | Temp 97.4°F | Resp 17 | Ht 70.0 in | Wt 223.0 lb

## 2016-03-22 DIAGNOSIS — E162 Hypoglycemia, unspecified: Secondary | ICD-10-CM | POA: Diagnosis not present

## 2016-03-22 DIAGNOSIS — R220 Localized swelling, mass and lump, head: Secondary | ICD-10-CM | POA: Diagnosis not present

## 2016-03-22 DIAGNOSIS — R1084 Generalized abdominal pain: Secondary | ICD-10-CM | POA: Diagnosis not present

## 2016-03-22 DIAGNOSIS — R0789 Other chest pain: Secondary | ICD-10-CM | POA: Diagnosis not present

## 2016-03-22 DIAGNOSIS — J90 Pleural effusion, not elsewhere classified: Secondary | ICD-10-CM | POA: Diagnosis not present

## 2016-03-22 DIAGNOSIS — R7989 Other specified abnormal findings of blood chemistry: Secondary | ICD-10-CM | POA: Diagnosis not present

## 2016-03-22 DIAGNOSIS — R221 Localized swelling, mass and lump, neck: Secondary | ICD-10-CM

## 2016-03-22 DIAGNOSIS — R5383 Other fatigue: Secondary | ICD-10-CM | POA: Diagnosis not present

## 2016-03-22 LAB — BASIC METABOLIC PANEL
BUN/Creatinine Ratio: 13 (ref 10–24)
BUN: 18 mg/dL (ref 8–27)
CO2: 26 mmol/L (ref 18–29)
Calcium: 8.8 mg/dL (ref 8.6–10.2)
Chloride: 102 mmol/L (ref 96–106)
Creatinine, Ser: 1.36 mg/dL — ABNORMAL HIGH (ref 0.76–1.27)
GFR calc Af Amer: 56 mL/min/{1.73_m2} — ABNORMAL LOW (ref >59)
GFR calc non Af Amer: 49 mL/min/{1.73_m2} — ABNORMAL LOW (ref >59)
Glucose: 109 mg/dL — ABNORMAL HIGH (ref 65–99)
Potassium: 4.3 mmol/L (ref 3.5–5.2)
Sodium: 141 mmol/L (ref 134–144)

## 2016-03-22 LAB — POCT CBC
Granulocyte percent: 82.6 %G — AB (ref 37–80)
HCT, POC: 35.1 % — AB (ref 43.5–53.7)
Hemoglobin: 11.9 g/dL — AB (ref 14.1–18.1)
Lymph, poc: 1.3 (ref 0.6–3.4)
MCH, POC: 25.9 pg — AB (ref 27–31.2)
MCHC: 33.8 g/dL (ref 31.8–35.4)
MCV: 76.5 fL — AB (ref 80–97)
MID (cbc): 0.1 (ref 0–0.9)
MPV: 7.8 fL (ref 0–99.8)
POC Granulocyte: 6.6 (ref 2–6.9)
POC LYMPH PERCENT: 16.5 %L (ref 10–50)
POC MID %: 0.9 %M (ref 0–12)
Platelet Count, POC: 197 10*3/uL (ref 142–424)
RBC: 4.59 M/uL — AB (ref 4.69–6.13)
RDW, POC: 17.7 %
WBC: 8 10*3/uL (ref 4.6–10.2)

## 2016-03-22 LAB — GLUCOSE, POCT (MANUAL RESULT ENTRY)
POC Glucose: 49 mg/dl — AB (ref 70–99)
POC Glucose: 143 mg/dl — AB (ref 70–99)

## 2016-03-22 NOTE — Patient Instructions (Addendum)
Benadryl up to every 6 hours OR zyrtec once per day in case neck swelling is sue to allergy (contrast from CT scan less likely or possible shrimp), fluids and food as tolerated.,If any new rash or hives, itchy throat or rash in mouth, trouble breathing, or any worsening of swelling of neck, go to emergency room. If stable, follow up with me in 3-4 days for recheck.   Fatigue may be related to low blood sugars. Decrease insulin to 5 units for now, and increase food intake. Monitor your blood sugar 3 times per day, or any time you feel sweaty, dizzy or fatigued.  Carry some sugar with you in case you are hypoglycemic (low sugar)again.   I will ask your cardiologist to look at your ekg to see if there are any concerns.   Return to the clinic or go to the nearest emergency room if any of your symptoms worsen or new symptoms occur.   Hypoglycemia Hypoglycemia occurs when the level of sugar (glucose) in the blood is too low. Glucose is a type of sugar that provides the body's main source of energy. Certain hormones (insulin and glucagon) control the level of glucose in the blood. Insulin lowers blood glucose, and glucagon increases blood glucose. Hypoglycemia can result from having too much insulin in the bloodstream, or from not eating enough food that contains glucose. Hypoglycemia can happen in people who do or do not have diabetes. It can develop quickly, and it can be a medical emergency. What are the causes? Hypoglycemia occurs most often in people who have diabetes. If you have diabetes, hypoglycemia may be caused by:  Diabetes medicine.  Not eating enough, or not eating often enough.  Increased physical activity.  Drinking alcohol, especially when you have not eaten recently. If you do not have diabetes, hypoglycemia may be caused by:  A tumor in the pancreas. The pancreas is the organ that makes insulin.  Not eating enough, or not eating for long periods at a time (fasting).  Severe  infection or illness that affects the liver, heart, or kidneys.  Certain medicines. You may also have reactive hypoglycemia. This condition causes hypoglycemia within 4 hours of eating a meal. This may occur after having stomach surgery. Sometimes, the cause of reactive hypoglycemia is not known. What increases the risk? Hypoglycemia is more likely to develop in:  People who have diabetes and take medicines to lower blood glucose.  People who abuse alcohol.  People who have a severe illness. What are the signs or symptoms? Hypoglycemia may not cause any symptoms. If you have symptoms, they may include:  Hunger.  Anxiety.  Sweating and feeling clammy.  Confusion.  Dizziness or feeling light-headed.  Sleepiness.  Nausea.  Increased heart rate.  Headache.  Blurry vision.  Seizure.  Nightmares.  Tingling or numbness around the mouth, lips, or tongue.  A change in speech.  Decreased ability to concentrate.  A change in coordination.  Restless sleep.  Tremors or shakes.  Fainting.  Irritability. How is this diagnosed? Hypoglycemia is diagnosed with a blood test to measure your blood glucose level. This blood test is done while you are having symptoms. Your health care provider may also do a physical exam and review your medical history. If you do not have diabetes, other tests may be done to find the cause of your hypoglycemia. How is this treated? This condition can often be treated by immediately eating or drinking something that contains glucose, such as:  3-4 sugar  tablets (glucose pills).  Glucose gel, 15-gram tube.  Fruit juice, 4 oz (120 mL).  Regular soda (not diet soda), 4 oz (120 mL).  Low-fat milk, 4 oz (120 mL).  Several pieces of hard candy.  Sugar or honey, 1 Tbsp. Treating Hypoglycemia If You Have Diabetes  If you are alert and able to swallow safely, follow the 15:15 rule:  Take 15 grams of a rapid-acting carbohydrate. Rapid-acting  options include:  1 tube of glucose gel.  3 glucose pills.  6-8 pieces of hard candy.  4 oz (120 mL) of fruit juice.  4 oz (120 ml) of regular (not diet) soda.  Check your blood glucose 15 minutes after you take the carbohydrate.  If the repeat blood glucose level is still at or below 70 mg/dL (3.9 mmol/L), take 15 grams of a carbohydrate again.  If your blood glucose level does not increase above 70 mg/dL (3.9 mmol/L) after 3 tries, seek emergency medical care.  After your blood glucose level returns to normal, eat a meal or a snack within 1 hour. Treating Severe Hypoglycemia  Severe hypoglycemia is when your blood glucose level is at or below 54 mg/dL (3 mmol/L). Severe hypoglycemia is an emergency. Do not wait to see if the symptoms will go away. Get medical help right away. Call your local emergency services (911 in the [place] Do not drive yourself to the hospital. If you have severe hypoglycemia and you cannot eat or drink, you may need an injection of glucagon. A family member or close friend should learn how to check your blood glucose and how to give you a glucagon injection. Ask your health care provider if you need to have an emergency glucagon injection kit available. Severe hypoglycemia may need to be treated in a hospital. The treatment may include getting glucose through an IV tube. You may also need treatment for the cause of your hypoglycemia. Follow these instructions at home: General instructions  Avoid any diets that cause you to not eat enough food. Talk with your health care provider before you start any new diet.  Take over-the-counter and prescription medicines only as told by your health care provider.  Limit alcohol intake to no more than 1 drink per day for nonpregnant women and 2 drinks per day for men. One drink equals 12 oz of beer, 5 oz of wine, or 1 oz of hard liquor.  Keep all follow-up visits as told by your health care provider. This is  important. If You Have Diabetes:   Make sure you know the symptoms of hypoglycemia.  Always have a rapid-acting carbohydrate snack with you to treat low blood sugar.  Follow your diabetes management plan, as told by your health care provider. Make sure you:  Take your medicines as directed.  Follow your exercise plan.  Follow your meal plan. Eat on time, and do not skip meals.  Check your blood glucose as often as directed. Make sure to check your blood glucose before and after exercise. If you exercise longer or in a different way than usual, check your blood glucose more often.  Follow your sick day plan whenever you cannot eat or drink normally. Make this plan in advance with your health care provider.  Share your diabetes management plan with people in your workplace, school, and household.  Check your urine for ketones when you are ill and as told by your health care provider.  Carry a medical alert card or wear medical alert jewelry. If  You Have Reactive Hypoglycemia or Low Blood Sugar From Other Causes:  Monitor your blood glucose as told by your health care provider.  Follow instructions from your health care provider about eating or drinking restrictions. Contact a health care provider if:  You have problems keeping your blood glucose in your target range.  You have frequent episodes of hypoglycemia. Get help right away if:  You continue to have hypoglycemia symptoms after eating or drinking something containing glucose.  Your blood glucose is at or below 54 mg/dL (3 mmol/L).  You have a seizure.  You faint. These symptoms may represent a serious problem that is an emergency. Do not wait to see if the symptoms will go away. Get medical help right away. Call your local emergency services (911 in the [place] Do not drive yourself to the hospital.  This information is not intended to replace advice given to you by your health care provider. Make sure you discuss any  questions you have with your health care provider. Document Released: 03/25/2005 Document Revised: 09/06/2015 Document Reviewed: 04/28/2015 Elsevier Interactive Patient Education  2017 [employer]  Fatigue Introduction Fatigue is feeling tired all of the time, a lack of energy, or a lack of motivation. Occasional or mild fatigue is often a normal response to activity or life in general. However, long-lasting (chronic) or extreme fatigue may indicate an underlying medical condition. Follow these instructions at home: Watch your fatigue for any changes. The following actions may help to lessen any discomfort you are feeling:  Talk to your health care provider about how much sleep you need each night. Try to get the required amount every night.  Take medicines only as directed by your health care provider.  Eat a healthy and nutritious diet. Ask your health care provider if you need help changing your diet.  Drink enough fluid to keep your urine clear or pale yellow.  Practice ways of relaxing, such as yoga, meditation, massage therapy, or acupuncture.  Exercise regularly.  Change situations that cause you stress. Try to keep your work and personal routine reasonable.  Do not abuse illegal drugs.  Limit alcohol intake to no more than 1 drink per day for nonpregnant women and 2 drinks per day for men. One drink equals 12 ounces of beer, 5 ounces of wine, or 1 ounces of hard liquor.  Take a multivitamin, if directed by your health care provider. Contact a health care provider if:  Your fatigue does not get better.  You have a fever.  You have unintentional weight loss or gain.  You have headaches.  You have difficulty:  Falling asleep.  Sleeping throughout the night.  You feel angry, guilty, anxious, or sad.  You are unable to have a bowel movement (constipation).  You skin is dry.  Your legs or another part of your body is swollen. Get help right away if:  You  feel confused.  Your vision is blurry.  You feel faint or pass out.  You have a severe headache.  You have severe abdominal, pelvic, or back pain.  You have chest pain, shortness of breath, or an irregular or fast heartbeat.  You are unable to urinate or you urinate less than normal.  You develop abnormal bleeding, such as bleeding from the rectum, vagina, nose, lungs, or nipples.  You vomit blood.  You have thoughts about harming yourself or committing suicide.  You are worried that you might harm someone else. This information is not intended to replace  advice given to you by your health care provider. Make sure you discuss any questions you have with your health care provider. Document Released: 01/20/2007 Document Revised: 08/31/2015 Document Reviewed: 07/27/2013  2017 [employer]vier    IF you received an x-ray today, you will receive an invoice from [facility]. Please contact [facility] at [phone] with questions or concerns regarding your invoice.   IF you received labwork today, you will receive an invoice from [facility]. Please contact [facility] at [phone] with questions or concerns regarding your invoice.   Our billing staff will not be able to assist you with questions regarding bills from these companies.  You will be contacted with the lab results as soon as they are available. The fastest way to get your results is to activate your My Chart account. Instructions are located on the last page of this paperwork. If you have not heard from [place] regarding the results in 2 weeks, please contact this office.

## 2016-03-22 NOTE — Progress Notes (Addendum)
Subjective:  By signing my name below, I, [name], attest that this documentation has been prepared under the direction and in the presence of [name], MD Electronically Signed: [name], ED Scribe 03/22/2016 at 9:35 AM.   Patient ID: [name], male    DOB: [birth year], 82 y.o.   MRN: [id number]  Chief Complaint  Patient presents with  . Other    neck swelling    HPI HPI Comments: [name] is a 82 y.o. male who presents to the [facility] here for follow-up of face swelling. Pt was seen 3 days ago for swelling below the jaw line bilaterally. Reported upset stomach after shrimp intake 3 weeks prior but no rash or swelling at that time. Ate shrimp 2 days prior and 1 day prior to last visit. Woke up that morning with puffiness in his neck and upset stomach. Persistent neck swelling at visit but no respiratory or mouth/thorat symptoms. No rash or hives. He did have a CT scan of his chest with contrast the night prior. Of note, he did have contrast with CT scan in September without difficulty.   Pt states that he still feels some swelling in his neck but has noticed minimal relief. He reports mild discomfort in his neck only with palpation. Pt has noticed that he has not been feeling well over the past few days, difficulty falling and staying asleep, a hot sweat this morning after a cup of coffee and decreased appetite. Pt has taken Benadryl intermittently and just once yesterday.   He has also noticed chest wall pain that radiates across his entire upper abdomen with taking deep breaths. Pt has a h/o multiple rib fractures and a left pleural effusion; see prior notes. He is taking 2 hydrocodone tablets daily instead of 1. He is checking his blood glucose and reports normal readings. Pt denies difficulty swallowing, rash, hives, itching, mouth sores, cough, sob, rhinorrhea, congestion, changes in urine or bowels.   HTN BP has been elevated last  few visits. He was seen by cardiologist November 15. When seen by cardiology, BP was thought to be due to pain by rib fractures. No change in medications. BP was 164/68 at that visit.   Patient Active Problem List   Diagnosis Date Noted  . Fall 12/14/2015  . Elevated serum creatinine 12/14/2015  . Ileus ([place]) 12/14/2015  . Multiple fractures of ribs of left side 12/12/2015  . Blood in the urine 12/01/2013  . Cystitis, radiation 12/01/2013  . Malignant neoplasm of prostate ([place]) 11/26/2013  . Pulsatile tinnitus 11/19/2013  . Rectal bleeding 12/23/2012  . Type II or unspecified type diabetes mellitus without mention of complication, not stated as uncontrolled   . Essential hypertension, benign   . Chronic back pain   . ED (erectile dysfunction)   . Anemia   . Hyperlipidemia   . CAD, NATIVE VESSEL 05/08/2008  . VENTRICULAR TACHYCARDIA 05/08/2008  . PREMATURE VENTRICULAR CONTRACTIONS 05/08/2008   Past Medical History:  Diagnosis Date  . Allergy   . CAD (coronary artery disease)    pci to LAD 10/09; stable CAD by cath 05/31/08; myoview 04/11/11- no ishcemia, EF 67%; echo 05/15/09- EF>55%, mod calcification of the aortic valve leaflets  . Chronic back pain   . ED (erectile dysfunction)   . Essential hypertension, benign   . Hyperlipidemia   . Multiple rib fractures    left 9th, 10th, and 11th posterior rib fractures S/P fall 12/09/2015/notes 12/12/2015  .  Prostate cancer ([place])    s/p  . Type II or unspecified type diabetes mellitus without mention of complication, not stated as uncontrolled   . Ventricular tachycardia ([place])    resuscitated; monitor 05/2008; attempted T ablation 2/10- aborted due to inappropriate substrate   Past Surgical History:  Procedure Laterality Date  . CARDIAC CATHETERIZATION  05/31/08   EF 55-60%, stable lesions, medical therapy  . COLECTOMY  '80's   polyps  . CORONARY ANGIOPLASTY WITH STENT PLACEMENT  01/19/08   PCI stent to mid LAD with Promus DES 27.5x28  .  IR GENERIC HISTORICAL  01/03/2016   IR THORACENTESIS ASP PLEURAL SPACE W/IMG GUIDE 01/03/2016 [facility]  . PTCA  01/2008   No Known Allergies Prior to Admission medications   Medication Sig Start Date End Date Taking? Authorizing Provider  atorvastatin (LIPITOR) 10 MG tablet Take 1 tablet (10 mg total) by mouth daily. 11/16/15  Yes [name], MD  ferrous sulfate 325 (65 FE) MG tablet Take 325 mg by mouth daily with breakfast.   Yes Historical Provider, MD  glucose blood (TRUETEST TEST) test strip USE AS DIRECTED THREE TIMES DAILY 06/21/13  Yes [name], MD  HYDROcodone-acetaminophen (NORCO/VICODIN) 5-325 MG tablet Take 1-2 tablets by mouth every 6 (six) hours as needed for moderate pain. 03/14/16  Yes [name], MD  insulin NPH-regular Human (NOVOLIN 70/30) (70-30) 100 UNIT/ML injection Inject 7 Units into the skin 2 (two) times daily with a meal. 11/16/15  Yes [name], MD  Insulin Syringe-Needle U-100 (B-D INS SYR HALF-UNIT .3CC/31G) 31G X 5/16" 0.3 ML MISC Use daily at bedtime to inject insulin. Dx code: 250.00 06/21/13  Yes [name], MD  lisinopril-hydrochlorothiazide (PRINZIDE,ZESTORETIC) 20-12.5 MG tablet Take 1 tablet by mouth daily. 11/16/15  Yes [name], MD  metFORMIN (GLUCOPHAGE) 1000 MG tablet Take 1 tablet (1,000 mg total) by mouth 2 (two) times daily with a meal. 11/16/15  Yes [name], MD  metoprolol (LOPRESSOR) 50 MG tablet Take 1 tablet (50 mg total) by mouth 2 (two) times daily. 11/16/15  Yes [name], MD  Multiple Vitamin (MULTIVITAMIN) tablet Take 1 tablet by mouth daily.   Yes Historical Provider, MD  Omega-3 Fatty Acids (FISH OIL) 1000 MG CAPS Take 2 capsules by mouth daily.    Yes Historical Provider, MD  sildenafil (VIAGRA) 50 MG tablet Take 1 tablet (50 mg total) by mouth daily as needed for erectile dysfunction. 02/08/15  Yes Lorretta Harp, MD  Vitamin D, Cholecalciferol, 1000 UNITS TABS Take 1 tablet by mouth  daily.    Yes Historical Provider, MD   Social History   Social History  . Marital status: Legally Separated    Spouse name: [name]  . Number of children: 7  . Years of education: [name]   Occupational History  . RETIRED Retired   Social History Main Topics  . Smoking status: Former Smoker    Packs/day: 0.50    Years: 15.00    Types: Cigarettes    Quit date: 09/01/1990  . Smokeless tobacco: Never Used     Comment: quit 12/07/2013  . Alcohol use No  . Drug use: No  . Sexual activity: No   Other Topics Concern  . Not on file   Social History Narrative   29 grandchildren. Single. Education: 12 th grade. Exercise: 3-4 times a week for 30 minutes working out and set ups.   Review of Systems  Constitutional: Positive for  appetite change.  HENT: Positive for facial swelling (neck). Negative for congestion, mouth sores, rhinorrhea and trouble swallowing.   Respiratory: Negative for cough and shortness of breath.   Gastrointestinal: Positive for abdominal pain. Negative for constipation and diarrhea.  Genitourinary: Negative for difficulty urinating and hematuria.  Skin: Negative for rash.  Psychiatric/Behavioral: Positive for sleep disturbance.      Objective:   Physical Exam  Constitutional: He is oriented to person, place, and time. He appears well-developed and well-nourished.  HENT:  Head: Normocephalic and atraumatic.  Eyes: EOM are normal. Pupils are equal, round, and reactive to light.  Neck: No JVD present. Carotid bruit is not present.  Slight fullness below jaw line bilaterally and center of neck below the chin without apparent lymphadenopathy.   Cardiovascular: Normal rate, regular rhythm and normal heart sounds.   No murmur heard. Few ectopic beats.   Pulmonary/Chest: Effort normal and breath sounds normal. No respiratory distress. He has no rales.  Coarse breath sounds L lower lobe, otherwise clear. L chest wall tender.   Abdominal: Soft. He exhibits no distension.  There is no tenderness.  Musculoskeletal: He exhibits no edema.  Neurological: He is alert and oriented to person, place, and time.  Skin: Skin is warm and dry.  Psychiatric: He has a normal mood and affect.  Vitals reviewed.  Vitals:   03/22/16 0907 03/22/16 1012  BP: (!) 168/96 130/74  Pulse: 76   Resp: 17   Temp: 97.4 F (36.3 C)   TempSrc: Oral   SpO2: 98%   Weight: 223 lb (101.2 kg)   Height: '5\' 10"'$  (1.778 m)    Results for orders placed or performed in visit on 03/22/16  POCT CBC  Result Value Ref Range   WBC 8.0 4.6 - 10.2 K/uL   Lymph, poc 1.3 0.6 - 3.4   POC LYMPH PERCENT 16.5 10 - 50 %L   MID (cbc) 0.1 0 - 0.9   POC MID % 0.9 0 - 12 %M   POC Granulocyte 6.6 2 - 6.9   Granulocyte percent 82.6 (A) 37 - 80 %G   RBC 4.59 (A) 4.69 - 6.13 M/uL   Hemoglobin 11.9 (A) 14.1 - 18.1 g/dL   HCT, POC 35.1 (A) 43.5 - 53.7 %   MCV 76.5 (A) 80 - 97 fL   MCH, POC 25.9 (A) 27 - 31.2 pg   MCHC 33.8 31.8 - 35.4 g/dL   RDW, POC 17.7 %   Platelet Count, POC 197 142 - 424 K/uL   MPV 7.8 0 - 99.8 fL  POCT glucose (manual entry)  Result Value Ref Range   POC Glucose 49 (A) 70 - 99 mg/dl    11:05 AM Glucose reading noted. He feels fatigued, but not diaphoretic no other new symptoms. He did take 7 units of his insulin this morning (has been using his normal amounts of insulin the past few days), has been eating less recently.  Did have sweating this morning after coffee, but that had resolved.  15 g of glucose gel po given now.   EKG: sinus rhythm, occasional PAC, LAFB, L axis compared to prior EKG.   Discussed with cardiology, no concerning findings on present EKG.  Dg Neck Soft Tissue  Result Date: 03/22/2016 CLINICAL DATA:  Follow-up of facial swelling. EXAM: NECK SOFT TISSUES - 1+ VIEW COMPARISON:  None in PACs FINDINGS: The nasopharygeal an oropharyngeal and hypopharynx heel airway do not appear abnormally distended or markedly narrowed. The epiglottis is sharp.  The  prevertebral soft tissue spaces are normal. The trachea is midline. There is degenerative disc disease with large anterior endplate osteophytes at C4-5 and C5-6. IMPRESSION: No evidence of cervical airway compromise. Degenerative spondylosis of the mid cervical spine. Electronically Signed   By: David  Martinique M.D.   On: 03/22/2016 10:46   Dg Chest 2 View  Result Date: 03/22/2016 CLINICAL DATA:  Chest wall pain radiating into the upper abdomen, pleuritic component. History of multiple rib fractures and left pleural effusion. Coronary artery disease with stent placement. Former smoker. EXAM: CHEST  2 VIEW COMPARISON:  CT scan of the chest of March 18, 2016 and chest x-ray of February 22, 2016. FINDINGS: The lungs are adequately inflated. The left pleural effusion has decreased in volume since the November chest x-ray but similar in size to that seen on the CT scan of 4 days ago. There is no pneumothorax. Rib fractures on the left are partially visualized. The heart is normal in size. The pulmonary vascularity is normal. There is calcification in the wall of the aortic arch. There is multilevel degenerative disc disease of the thoracic spine. IMPRESSION: Small left pleural effusion. Chronic bronchitic changes. No evidence of a pulmonary contusion, pneumothorax, or pneumomediastinum. Known multiple left rib fractures only partially imaged on today's study. Thoracic aortic atherosclerosis. Electronically Signed   By: David  Martinique M.D.   On: 03/22/2016 10:45       Assessment & Plan:  [name] is a 82 y.o. male Fatigue, unspecified type - Plan: Basic metabolic panel, POCT CBC, EKG 12-Lead, POCT glucose (manual entry), POCT glucose (manual entry) Hypoglycemia - Plan: POCT glucose (manual entry)   - Based on in office glucose, may be relatively hypoglycemic at home as still taking same dose of insulin with decreased food.   -Stat BMP was obtained with recent contrast and prior elevated creatinine. No  concerning elevations. EKG without acute findings and discussed with cardiology without concerns.  -Decrease insulin to 5 units twice a day, hypoglycemic precautions. 10 hydration. Recheck in the next 3-4 days.  Neck swelling - Plan: POCT CBC, DG Neck Soft Tissue  -Afebrile, overall reassuring exam, and no significant swelling on soft tissue neck x-ray. Okay to continue Benadryl or Zyrtec over-the-counter for possible allergic cause,  allergy testing pending. Recheck in next 3-4 days. Er precautions if worse.   Chest wall pain - Plan: DG Chest 2 View  - Persistent fractures noted on recent CT, but pleural effusion appears to be decreasing in size. May be able to hold off on cardiothoracic surgery eval, I will discuss further at follow-up and possible interval chest x-rays to see if resolution is occurring.  Elevated serum creatinine - Plan: Basic metabolic panel  Generalized abdominal pain  - Abdomen soft, nontender, appears to be more chest wall pain.    No orders of the defined types were placed in this encounter.  Patient Instructions    Benadryl up to every 6 hours OR zyrtec once per day in case neck swelling is sue to allergy (contrast from CT scan less likely or possible shrimp), fluids and food as tolerated.,If any new rash or hives, itchy throat or rash in mouth, trouble breathing, or any worsening of swelling of neck, go to emergency room. If stable, follow up with me in 3-4 days for recheck.   Fatigue may be related to low blood sugars. Decrease insulin to 5 units for now, and increase food intake. Monitor your blood sugar 3 times per day,  or any time you feel sweaty, dizzy or fatigued.  Carry some sugar with you in case you are hypoglycemic (low sugar)again.   I will ask your cardiologist to look at your ekg to see if there are any concerns.   Return to the clinic or go to the nearest emergency room if any of your symptoms worsen or new symptoms  occur.   Hypoglycemia Hypoglycemia occurs when the level of sugar (glucose) in the blood is too low. Glucose is a type of sugar that provides the body's main source of energy. Certain hormones (insulin and glucagon) control the level of glucose in the blood. Insulin lowers blood glucose, and glucagon increases blood glucose. Hypoglycemia can result from having too much insulin in the bloodstream, or from not eating enough food that contains glucose. Hypoglycemia can happen in people who do or do not have diabetes. It can develop quickly, and it can be a medical emergency. What are the causes? Hypoglycemia occurs most often in people who have diabetes. If you have diabetes, hypoglycemia may be caused by:  Diabetes medicine.  Not eating enough, or not eating often enough.  Increased physical activity.  Drinking alcohol, especially when you have not eaten recently. If you do not have diabetes, hypoglycemia may be caused by:  A tumor in the pancreas. The pancreas is the organ that makes insulin.  Not eating enough, or not eating for long periods at a time (fasting).  Severe infection or illness that affects the liver, heart, or kidneys.  Certain medicines. You may also have reactive hypoglycemia. This condition causes hypoglycemia within 4 hours of eating a meal. This may occur after having stomach surgery. Sometimes, the cause of reactive hypoglycemia is not known. What increases the risk? Hypoglycemia is more likely to develop in:  People who have diabetes and take medicines to lower blood glucose.  People who abuse alcohol.  People who have a severe illness. What are the signs or symptoms? Hypoglycemia may not cause any symptoms. If you have symptoms, they may include:  Hunger.  Anxiety.  Sweating and feeling clammy.  Confusion.  Dizziness or feeling light-headed.  Sleepiness.  Nausea.  Increased heart rate.  Headache.  Blurry  vision.  Seizure.  Nightmares.  Tingling or numbness around the mouth, lips, or tongue.  A change in speech.  Decreased ability to concentrate.  A change in coordination.  Restless sleep.  Tremors or shakes.  Fainting.  Irritability. How is this diagnosed? Hypoglycemia is diagnosed with a blood test to measure your blood glucose level. This blood test is done while you are having symptoms. Your health care provider may also do a physical exam and review your medical history. If you do not have diabetes, other tests may be done to find the cause of your hypoglycemia. How is this treated? This condition can often be treated by immediately eating or drinking something that contains glucose, such as:  3-4 sugar tablets (glucose pills).  Glucose gel, 15-gram tube.  Fruit juice, 4 oz (120 mL).  Regular soda (not diet soda), 4 oz (120 mL).  Low-fat milk, 4 oz (120 mL).  Several pieces of hard candy.  Sugar or honey, 1 Tbsp. Treating Hypoglycemia If You Have Diabetes  If you are alert and able to swallow safely, follow the 15:15 rule:  Take 15 grams of a rapid-acting carbohydrate. Rapid-acting options include:  1 tube of glucose gel.  3 glucose pills.  6-8 pieces of hard candy.  4 oz (120 mL)  of fruit juice.  4 oz (120 ml) of regular (not diet) soda.  Check your blood glucose 15 minutes after you take the carbohydrate.  If the repeat blood glucose level is still at or below 70 mg/dL (3.9 mmol/L), take 15 grams of a carbohydrate again.  If your blood glucose level does not increase above 70 mg/dL (3.9 mmol/L) after 3 tries, seek emergency medical care.  After your blood glucose level returns to normal, eat a meal or a snack within 1 hour. Treating Severe Hypoglycemia  Severe hypoglycemia is when your blood glucose level is at or below 54 mg/dL (3 mmol/L). Severe hypoglycemia is an emergency. Do not wait to see if the symptoms will go away. Get medical help right  away. Call your local emergency services (911 in the [place] Do not drive yourself to the hospital. If you have severe hypoglycemia and you cannot eat or drink, you may need an injection of glucagon. A family member or close friend should learn how to check your blood glucose and how to give you a glucagon injection. Ask your health care provider if you need to have an emergency glucagon injection kit available. Severe hypoglycemia may need to be treated in a hospital. The treatment may include getting glucose through an IV tube. You may also need treatment for the cause of your hypoglycemia. Follow these instructions at home: General instructions  Avoid any diets that cause you to not eat enough food. Talk with your health care provider before you start any new diet.  Take over-the-counter and prescription medicines only as told by your health care provider.  Limit alcohol intake to no more than 1 drink per day for nonpregnant women and 2 drinks per day for men. One drink equals 12 oz of beer, 5 oz of wine, or 1 oz of hard liquor.  Keep all follow-up visits as told by your health care provider. This is important. If You Have Diabetes:   Make sure you know the symptoms of hypoglycemia.  Always have a rapid-acting carbohydrate snack with you to treat low blood sugar.  Follow your diabetes management plan, as told by your health care provider. Make sure you:  Take your medicines as directed.  Follow your exercise plan.  Follow your meal plan. Eat on time, and do not skip meals.  Check your blood glucose as often as directed. Make sure to check your blood glucose before and after exercise. If you exercise longer or in a different way than usual, check your blood glucose more often.  Follow your sick day plan whenever you cannot eat or drink normally. Make this plan in advance with your health care provider.  Share your diabetes management plan with people in your workplace, school, and  household.  Check your urine for ketones when you are ill and as told by your health care provider.  Carry a medical alert card or wear medical alert jewelry. If You Have Reactive Hypoglycemia or Low Blood Sugar From Other Causes:  Monitor your blood glucose as told by your health care provider.  Follow instructions from your health care provider about eating or drinking restrictions. Contact a health care provider if:  You have problems keeping your blood glucose in your target range.  You have frequent episodes of hypoglycemia. Get help right away if:  You continue to have hypoglycemia symptoms after eating or drinking something containing glucose.  Your blood glucose is at or below 54 mg/dL (3 mmol/L).  You have a  seizure.  You faint. These symptoms may represent a serious problem that is an emergency. Do not wait to see if the symptoms will go away. Get medical help right away. Call your local emergency services (911 in the [place] Do not drive yourself to the hospital.  This information is not intended to replace advice given to you by your health care provider. Make sure you discuss any questions you have with your health care provider. Document Released: 03/25/2005 Document Revised: 09/06/2015 Document Reviewed: 04/28/2015 Elsevier Interactive Patient Education  2017 [employer]vier [employer].  Fatigue Introduction Fatigue is feeling tired all of the time, a lack of energy, or a lack of motivation. Occasional or mild fatigue is often a normal response to activity or life in general. However, long-lasting (chronic) or extreme fatigue may indicate an underlying medical condition. Follow these instructions at home: Watch your fatigue for any changes. The following actions may help to lessen any discomfort you are feeling:  Talk to your health care provider about how much sleep you need each night. Try to get the required amount every night.  Take medicines only as directed by your health  care provider.  Eat a healthy and nutritious diet. Ask your health care provider if you need help changing your diet.  Drink enough fluid to keep your urine clear or pale yellow.  Practice ways of relaxing, such as yoga, meditation, massage therapy, or acupuncture.  Exercise regularly.  Change situations that cause you stress. Try to keep your work and personal routine reasonable.  Do not abuse illegal drugs.  Limit alcohol intake to no more than 1 drink per day for nonpregnant women and 2 drinks per day for men. One drink equals 12 ounces of beer, 5 ounces of wine, or 1 ounces of hard liquor.  Take a multivitamin, if directed by your health care provider. Contact a health care provider if:  Your fatigue does not get better.  You have a fever.  You have unintentional weight loss or gain.  You have headaches.  You have difficulty:  Falling asleep.  Sleeping throughout the night.  You feel angry, guilty, anxious, or sad.  You are unable to have a bowel movement (constipation).  You skin is dry.  Your legs or another part of your body is swollen. Get help right away if:  You feel confused.  Your vision is blurry.  You feel faint or pass out.  You have a severe headache.  You have severe abdominal, pelvic, or back pain.  You have chest pain, shortness of breath, or an irregular or fast heartbeat.  You are unable to urinate or you urinate less than normal.  You develop abnormal bleeding, such as bleeding from the rectum, vagina, nose, lungs, or nipples.  You vomit blood.  You have thoughts about harming yourself or committing suicide.  You are worried that you might harm someone else. This information is not intended to replace advice given to you by your health care provider. Make sure you discuss any questions you have with your health care provider. Document Released: 01/20/2007 Document Revised: 08/31/2015 Document Reviewed: 07/27/2013  2017  Elsevier    IF you received an x-ray today, you will receive an invoice from [facility]. Please contact [facility] at [phone] with questions or concerns regarding your invoice.   IF you received labwork today, you will receive an invoice from [facility]. Please contact [facility] at [phone] with questions or concerns regarding your invoice.   Our billing staff will  not be able to assist you with questions regarding bills from these companies.  You will be contacted with the lab results as soon as they are available. The fastest way to get your results is to activate your My Chart account. Instructions are located on the last page of this paperwork. If you have not heard from [place] regarding the results in 2 weeks, please contact this office.         I personally performed the services described in this documentation, which was scribed in my presence. The recorded information has been reviewed and considered, and addended by me as needed.   Signed,   [name], MD [facility].  03/22/16 11:09 AM

## 2016-03-24 LAB — FOOD ALLERGY PROFILE
Allergen Corn, IgE: <0.1 kU/L
Clam IgE: <0.1 kU/L
Codfish IgE: <0.1 kU/L
Egg White IgE: <0.1 kU/L
Milk IgE: <0.1 kU/L
Peanut IgE: <0.1 kU/L
Scallop IgE: <0.1 kU/L
Sesame Seed IgE: <0.1 kU/L
Shrimp IgE: <0.1 kU/L
Soybean IgE: <0.1 kU/L
Walnut IgE: <0.1 kU/L
Wheat IgE: <0.1 kU/L

## 2016-03-25 ENCOUNTER — Ambulatory Visit (INDEPENDENT_AMBULATORY_CARE_PROVIDER_SITE_OTHER): Payer: Commercial Managed Care - HMO | Admitting: Family Medicine

## 2016-03-25 VITALS — BP 162/90 | HR 75 | Temp 98.5°F | Resp 16 | Ht 72.0 in | Wt 222.0 lb

## 2016-03-25 DIAGNOSIS — J90 Pleural effusion, not elsewhere classified: Secondary | ICD-10-CM

## 2016-03-25 DIAGNOSIS — R0789 Other chest pain: Secondary | ICD-10-CM

## 2016-03-25 DIAGNOSIS — E162 Hypoglycemia, unspecified: Secondary | ICD-10-CM | POA: Diagnosis not present

## 2016-03-25 DIAGNOSIS — S2242XD Multiple fractures of ribs, left side, subsequent encounter for fracture with routine healing: Secondary | ICD-10-CM | POA: Diagnosis not present

## 2016-03-25 DIAGNOSIS — R221 Localized swelling, mass and lump, neck: Secondary | ICD-10-CM

## 2016-03-25 LAB — BASIC METABOLIC PANEL

## 2016-03-25 MED ORDER — HYDROCODONE-ACETAMINOPHEN 5-325 MG PO TABS: 1.0000 | ORAL_TABLET | Freq: Four times a day (QID) | ORAL | 0 refills | Status: DC | PRN

## 2016-03-25 NOTE — Patient Instructions (Addendum)
Change from Benadryl to Zyrtec 1 pill once per day for this week, then if any increased neck swelling, or not improving through the week, return for recheck next week. Sooner or to emergency room if worse.  Okay to continue the same dose of insulin for now, but if your blood sugars start running higher, return to previous dose of 7 units twice per day. Watch for any low blood sugar symptoms as you had last week.  Hydrocodone if needed for rib pain, if mild pain, can take Tylenol over-the-counter in place of the hydrocodone. Follow-up with me in the next 4 weeks to repeat your x-ray to see if the fluid is still improving. If not, we can still refer you to a chest surgeon.  Return to the clinic or go to the nearest emergency room if any of your symptoms worsen or new symptoms occur.   IF you received an x-ray today, you will receive an invoice from [facility]. Please contact [facility] at [phone] with questions or concerns regarding your invoice.   IF you received labwork today, you will receive an invoice from [place]. Please contact [facility] at [phone] with questions or concerns regarding your invoice.   Our billing staff will not be able to assist you with questions regarding bills from these companies.  You will be contacted with the lab results as soon as they are available. The fastest way to get your results is to activate your My Chart account. Instructions are located on the last page of this paperwork. If you have not heard from [place] regarding the results in 2 weeks, please contact this office.

## 2016-03-25 NOTE — Progress Notes (Signed)
Subjective:  This chart was scribed for [name] MD, by [name], at [facility].  This patient was seen in room 5 and the patient's care was started at 8:48 AM.   Chief Complaint  Patient presents with  . Follow-up    pt sugar was down and throat was swollen x 1 week     Patient ID: [name], male    DOB: [birth year], 82 y.o.   MRN: [id number]  HPI HPI Comments: [name] is a 82 y.o. male who presents to the [facility] for a follow up of neck swelling and fatigue.  Patients swelling has gone down since his last visit and states that he does not have difficulty eating or drinking.  He feels that it is "close" to being back to normal.   He has been compliant with his benadryl and denies any fevers or throat pain.  Patient notes of taking contrast many years ago which resulted in vomiting.  Compliant with calcium and vitamin D.   Patient has been compliant with his insulin.  He is now eating well  and his lowest numbers have been ranging around 93, highest: 101 after eating. He denies any fatigue currently and feels like his old self.    Left pleural effusion after rib fractures: Patient denies coughing up any phlegm.  He denies any difficulty breathing or chest pain.  He has been taking hydrocodone twice per day since his last office visit. He has attempted to cut back to 1 hydrocodone per day but notes of feeling very sore so he takes 2. He ran out of medication last night and has pain while sleeping on his left side.   Neck swelling: see past two visits.  Concern for possible allergy to shrimp versus siledenitis . Treated with benadryl initially. Stable at last visit with negative x ray and normal CBC. Recommended to continued benadryl or zyrtec. He had a food allergy profile that has come back negative for all tested foods including shrimp.   Fatigue: At last visit patient had not been eating well but had been continuing  to take his same dose of insulin. Hypoglycemic in the office. Decreased his insulin to 5 units twice per day. Creatinine was stable. Other electrolytes were stable.       Patient Active Problem List   Diagnosis Date Noted  . Fall 12/14/2015  . Elevated serum creatinine 12/14/2015  . Ileus ([place]) 12/14/2015  . Multiple fractures of ribs of left side 12/12/2015  . Blood in the urine 12/01/2013  . Cystitis, radiation 12/01/2013  . Malignant neoplasm of prostate ([place]) 11/26/2013  . Pulsatile tinnitus 11/19/2013  . Rectal bleeding 12/23/2012  . Type II or unspecified type diabetes mellitus without mention of complication, not stated as uncontrolled   . Essential hypertension, benign   . Chronic back pain   . ED (erectile dysfunction)   . Anemia   . Hyperlipidemia   . CAD, NATIVE VESSEL 05/08/2008  . VENTRICULAR TACHYCARDIA 05/08/2008  . PREMATURE VENTRICULAR CONTRACTIONS 05/08/2008   Past Medical History:  Diagnosis Date  . Allergy   . CAD (coronary artery disease)    pci to LAD 10/09; stable CAD by cath 05/31/08; myoview 04/11/11- no ishcemia, EF 67%; echo 05/15/09- EF>55%, mod calcification of the aortic valve leaflets  . Chronic back pain   . ED (erectile dysfunction)   . Essential hypertension, benign   . Hyperlipidemia   . Multiple rib  fractures    left 9th, 10th, and 11th posterior rib fractures S/P fall 12/09/2015/notes 12/12/2015  . Prostate cancer ([place])    s/p  . Type II or unspecified type diabetes mellitus without mention of complication, not stated as uncontrolled   . Ventricular tachycardia ([place])    resuscitated; monitor 05/2008; attempted T ablation 2/10- aborted due to inappropriate substrate   Past Surgical History:  Procedure Laterality Date  . CARDIAC CATHETERIZATION  05/31/08   EF 55-60%, stable lesions, medical therapy  . COLECTOMY  '80's   polyps  . CORONARY ANGIOPLASTY WITH STENT PLACEMENT  01/19/08   PCI stent to mid LAD with Promus DES 27.5x28  . IR GENERIC  HISTORICAL  01/03/2016   IR THORACENTESIS ASP PLEURAL SPACE W/IMG GUIDE 01/03/2016 [facility]INTER[facility] RAD  . PTCA  01/2008   No Known Allergies Prior to Admission medications   Medication Sig Start Date End Date Taking? Authorizing Provider  atorvastatin (LIPITOR) 10 MG tablet Take 1 tablet (10 mg total) by mouth daily. 11/16/15   [name], MD  ferrous sulfate 325 (65 FE) MG tablet Take 325 mg by mouth daily with breakfast.    Historical Provider, MD  glucose blood (TRUETEST TEST) test strip USE AS DIRECTED THREE TIMES DAILY 06/21/13   [name], MD  HYDROcodone-acetaminophen (NORCO/VICODIN) 5-325 MG tablet Take 1-2 tablets by mouth every 6 (six) hours as needed for moderate pain. 03/14/16   [name], MD  insulin NPH-regular Human (NOVOLIN 70/30) (70-30) 100 UNIT/ML injection Inject 7 Units into the skin 2 (two) times daily with a meal. 11/16/15   [name], MD  Insulin Syringe-Needle U-100 (B-D INS SYR HALF-UNIT .3CC/31G) 31G X 5/16" 0.3 ML MISC Use daily at bedtime to inject insulin. Dx code: 250.00 06/21/13   [name], MD  lisinopril-hydrochlorothiazide (PRINZIDE,ZESTORETIC) 20-12.5 MG tablet Take 1 tablet by mouth daily. 11/16/15   [name], MD  metFORMIN (GLUCOPHAGE) 1000 MG tablet Take 1 tablet (1,000 mg total) by mouth 2 (two) times daily with a meal. 11/16/15   [name], MD  metoprolol (LOPRESSOR) 50 MG tablet Take 1 tablet (50 mg total) by mouth 2 (two) times daily. 11/16/15   [name], MD  Multiple Vitamin (MULTIVITAMIN) tablet Take 1 tablet by mouth daily.    Historical Provider, MD  Omega-3 Fatty Acids (FISH OIL) 1000 MG CAPS Take 2 capsules by mouth daily.     Historical Provider, MD  sildenafil (VIAGRA) 50 MG tablet Take 1 tablet (50 mg total) by mouth daily as needed for erectile dysfunction. 02/08/15   Lorretta Harp, MD  Vitamin D, Cholecalciferol, 1000 UNITS TABS Take 1 tablet by mouth daily.     Historical Provider, MD   Social  History   Social History  . Marital status: Legally Separated    Spouse name: [name]  . Number of children: 7  . Years of education: [name]   Occupational History  . RETIRED Retired   Social History Main Topics  . Smoking status: Former Smoker    Packs/day: 0.50    Years: 15.00    Types: Cigarettes    Quit date: 09/01/1990  . Smokeless tobacco: Never Used     Comment: quit 12/07/2013  . Alcohol use No  . Drug use: No  . Sexual activity: No   Other Topics Concern  . Not on file   Social History Narrative   29 grandchildren. Single. Education: 12 th grade. Exercise: 3-4 times a  week for 30 minutes working out and set ups.      Review of Systems  Constitutional: Negative for chills and fever.  HENT: Negative for sore throat.   Eyes: Negative for pain and redness.  Respiratory: Negative for cough, choking, chest tightness and shortness of breath.   Cardiovascular: Negative for chest pain.  Gastrointestinal: Negative for nausea and vomiting.  Neurological: Negative for syncope and speech difficulty.       Objective:   Physical Exam  Constitutional: He is oriented to person, place, and time. He appears well-developed. No distress.  HENT:  Head: Normocephalic and atraumatic.  Eyes: Pupils are equal, round, and reactive to light.  Neck:  Very slight prominence of the soft tissue below the jaw line but no lymphadenopathy and non tender.   Cardiovascular: Normal rate.   Pulmonary/Chest: Effort normal and breath sounds normal. No respiratory distress. He has no wheezes. He has no rales.  Neurological: He is alert and oriented to person, place, and time.  Skin: Skin is warm and dry.  Psychiatric: He has a normal mood and affect. His behavior is normal.   Vitals:   03/25/16 0836  BP: (!) 162/90  Pulse: 75  Resp: 16  Temp: 98.5 F (36.9 C)  TempSrc: Oral  SpO2: 99%  Weight: 222 lb (100.7 kg)  Height: 6' (1.829 m)          Assessment & Plan:   [name] is a  82 y.o. male Hypoglycemia  - Likely cause of fatigue last week. Feeling well now, no further hypoglycemia. Continue same dose of insulin, then increase back up to previous regimen if sugars start running higher. RTC precautions.  Neck swelling  -Improving. No rash, no respiratory symptoms. See prior workup. Allergy testing negative for shrimp allergy or other food allergy. Based on timing of symptoms and previous contrast for CT scan, this may also be a possible cause, but no rash or other reaction. Placed on allergy list but can discuss with radiologist if contrast needed again has not definitely allergy.  -Change from Benadryl to Zyrtec once per day for the next 4-5 days, then RTC precautions if worsens or not continuing to improve.  Closed fracture of multiple ribs of left side with routine healing, subsequent encounter - Plan: HYDROcodone-acetaminophen (NORCO/VICODIN) 5-325 MG tablet Left-sided chest wall pain - Plan: HYDROcodone-acetaminophen (NORCO/VICODIN) 5-325 MG tablet Pleural effusion, left  - Still healing on previous CT chest. Hydrocodone refilled if needed for breakthrough pain, Tylenol for mild pain, and recheck in the next 4 weeks to repeat chest x-ray as prior effusion appears to have decreased in size on CT scan of chest. RTC precautions if worsening, and still consider toracic surgeon eval if not improving on repeat imaging.   Elevated BP thought to be due to rib pain. Can monitor out of office, and recheck at follow-up visit. If remains elevated as pain improves, change  regimen. See notes at cardiology visit.   Meds ordered this encounter  Medications  . HYDROcodone-acetaminophen (NORCO/VICODIN) 5-325 MG tablet    Sig: Take 1-2 tablets by mouth every 6 (six) hours as needed for moderate pain.    Dispense:  20 tablet    Refill:  0   Patient Instructions   Change from Benadryl to Zyrtec 1 pill once per day for this week, then if any increased neck swelling, or not  improving through the week, return for recheck next week. Sooner or to emergency room if worse.  Okay to  continue the same dose of insulin for now, but if your blood sugars start running higher, return to previous dose of 7 units twice per day. Watch for any low blood sugar symptoms as you had last week.  Hydrocodone if needed for rib pain, if mild pain, can take Tylenol over-the-counter in place of the hydrocodone. Follow-up with me in the next 4 weeks to repeat your x-ray to see if the fluid is still improving. If not, we can still refer you to a chest surgeon.  Return to the clinic or go to the nearest emergency room if any of your symptoms worsen or new symptoms occur.   IF you received an x-ray today, you will receive an invoice from [facility]. Please contact [facility] at [phone] with questions or concerns regarding your invoice.   IF you received labwork today, you will receive an invoice from [place]. Please contact [facility] at [phone] with questions or concerns regarding your invoice.   Our billing staff will not be able to assist you with questions regarding bills from these companies.  You will be contacted with the lab results as soon as they are available. The fastest way to get your results is to activate your My Chart account. Instructions are located on the last page of this paperwork. If you have not heard from [place] regarding the results in 2 weeks, please contact this office.        I personally performed the services described in this documentation, which was scribed in my presence. The recorded information has been reviewed and considered, and addended by me as needed.   Signed,   [name], MD [facility].  03/25/16 9:04 AM

## 2016-03-30 ENCOUNTER — Telehealth: Payer: Self-pay

## 2016-03-30 NOTE — Telephone Encounter (Signed)
Patient needs a new glucometer called into [facility], states the one he has now is old and they do not make the test strips for it anymore. So he would like to to have an extra order of test strips as well for his new machine.  His call back number is [phone]

## 2016-04-03 MED ORDER — GLUCOSE BLOOD VI STRP: ORAL_STRIP | 3 refills | Status: AC

## 2016-04-03 MED ORDER — ACCU-CHEK MULTICLIX LANCETS MISC: 3 refills | Status: AC

## 2016-04-03 MED ORDER — ACCU-CHEK AVIVA PLUS W/DEVICE KIT: PACK | 0 refills | Status: AC

## 2016-04-03 NOTE — Telephone Encounter (Signed)
[place] and sent in [place]. Notified pt.

## 2016-04-09 ENCOUNTER — Other Ambulatory Visit: Payer: Self-pay

## 2016-04-09 DIAGNOSIS — S2242XD Multiple fractures of ribs, left side, subsequent encounter for fracture with routine healing: Secondary | ICD-10-CM

## 2016-04-09 DIAGNOSIS — R0789 Other chest pain: Secondary | ICD-10-CM

## 2016-04-09 NOTE — Telephone Encounter (Signed)
Patient called requesting a refill of his Hydrocodone medication.  He is a patient of Dr. Carlota Raspberry and states he is in a lot of pain.  Please advise  [phone]

## 2016-04-10 MED ORDER — HYDROCODONE-ACETAMINOPHEN 5-325 MG PO TABS: 1.0000 | ORAL_TABLET | Freq: Four times a day (QID) | ORAL | 0 refills | Status: DC | PRN

## 2016-04-10 NOTE — Telephone Encounter (Signed)
Pt was seen 12/18 and given #20 tabs at that time.

## 2016-04-10 NOTE — Telephone Encounter (Signed)
Refilled for #30.  I am out of office today, but can sign tomorrow morning. Follow up in about 2 weeks.

## 2016-04-11 NOTE — Telephone Encounter (Signed)
No v/m  rx up front

## 2016-04-16 ENCOUNTER — Other Ambulatory Visit: Payer: Self-pay | Admitting: Family Medicine

## 2016-05-02 ENCOUNTER — Ambulatory Visit (INDEPENDENT_AMBULATORY_CARE_PROVIDER_SITE_OTHER): Payer: Medicare HMO | Admitting: Family Medicine

## 2016-05-02 ENCOUNTER — Encounter: Payer: Self-pay | Admitting: Family Medicine

## 2016-05-02 ENCOUNTER — Ambulatory Visit (INDEPENDENT_AMBULATORY_CARE_PROVIDER_SITE_OTHER): Payer: Medicare HMO

## 2016-05-02 VITALS — BP 170/79 | HR 73 | Temp 98.0°F | Ht 72.0 in | Wt 227.0 lb

## 2016-05-02 DIAGNOSIS — Z794 Long term (current) use of insulin: Secondary | ICD-10-CM | POA: Diagnosis not present

## 2016-05-02 DIAGNOSIS — J90 Pleural effusion, not elsewhere classified: Secondary | ICD-10-CM

## 2016-05-02 DIAGNOSIS — R0789 Other chest pain: Secondary | ICD-10-CM

## 2016-05-02 DIAGNOSIS — E118 Type 2 diabetes mellitus with unspecified complications: Secondary | ICD-10-CM

## 2016-05-02 DIAGNOSIS — S2242XD Multiple fractures of ribs, left side, subsequent encounter for fracture with routine healing: Secondary | ICD-10-CM | POA: Diagnosis not present

## 2016-05-02 DIAGNOSIS — S2242XG Multiple fractures of ribs, left side, subsequent encounter for fracture with delayed healing: Secondary | ICD-10-CM

## 2016-05-02 DIAGNOSIS — Z13820 Encounter for screening for osteoporosis: Secondary | ICD-10-CM

## 2016-05-02 MED ORDER — HYDROCODONE-ACETAMINOPHEN 5-325 MG PO TABS: 1.0000 | ORAL_TABLET | Freq: Four times a day (QID) | ORAL | 0 refills | Status: DC | PRN

## 2016-05-02 NOTE — Progress Notes (Addendum)
By signing my name below, I, [name], attest that this documentation has been prepared under the direction and in the presence of [name], MD.  Electronically Signed: [name], Medical Scribe. 05/02/16. 12:47 PM.  Subjective:    Patient ID: [name], male    DOB: [birth year], 83 y.o.   MRN: [id number]  HPI Chief Complaint  Patient presents with  . Medication Refill    hydrocondone  . Follow-up    on b/p    HPI Comments: [name] is a 83 y.o. male who presents to the [facility] for rib fracture with secondary plural effusion follow-up. Initial date of injury for rib fracture 12/09/2015; he required thoracentesis on 9/6 and 01/03/16. He had had some persistent pain in the areas of the rib fracture. CT of his chest Dec 11th indicated a small left plural effusion with areas of atelectasis in left lower lobe; at that time the left 8th - 11th rib fractures showed partial healing of the 8th and 9th rib fractures but all fracture lines still evident. CXR on 03/22/16 still indicating a small left plural effusion. He was taking 1 hydrocodone a day on most days with occasional 2 pills PRN as of last visit. Planned for repeat CXR today and effusion appeared to be improving, but planned for possible thoracic surgery eval if effusion not improving. Last Rx for hydrocodone was for #30 on Jan 3rd.  Pt has been doing more physical activity by lifting, working, and painting and it has caused him to feel it in his ribs. Pt took 3 hydrocodone a day for relief of his sxs. Pt reports experiencing SOB when he's working more; when he rest he's fine. Reports feeling short winded when he sits up. Pt was not placed on hormone therapy after his prostate exam. Denies chest pain, fever, coughing, hemoptysis, and chest tightness.  HTN: See prior visits. Readings have been remained slightly elevated; thought to be due in part to rib pain. See by cardiology Nov 15th with  reading of 164/68, no changes in medication recommended at that time. Pt's systolic BP at home is 295. Pt does not check his bp when he's not in pain.  Knee Pain: The pain in his knees prevent him from walking up the steps. He has an appt for it tomorrow.  DM: Pt takes 7 units BID and his blood sugar has been fine. Lab Results  Component Value Date   HGBA1C 6.8 (H) 02/22/2016   Patient Active Problem List   Diagnosis Date Noted  . Fall 12/14/2015  . Elevated serum creatinine 12/14/2015  . Ileus ([place]) 12/14/2015  . Multiple fractures of ribs of left side 12/12/2015  . Blood in the urine 12/01/2013  . Cystitis, radiation 12/01/2013  . Malignant neoplasm of prostate ([place]) 11/26/2013  . Pulsatile tinnitus 11/19/2013  . Rectal bleeding 12/23/2012  . Type II or unspecified type diabetes mellitus without mention of complication, not stated as uncontrolled   . Essential hypertension, benign   . Chronic back pain   . ED (erectile dysfunction)   . Anemia   . Hyperlipidemia   . CAD, NATIVE VESSEL 05/08/2008  . VENTRICULAR TACHYCARDIA 05/08/2008  . PREMATURE VENTRICULAR CONTRACTIONS 05/08/2008   Past Medical History:  Diagnosis Date  . Allergy   . CAD (coronary artery disease)    pci to LAD 10/09; stable CAD by cath 05/31/08; myoview 04/11/11- no ishcemia, EF 67%; echo 05/15/09- EF>55%, mod calcification of the aortic valve  leaflets  . Chronic back pain   . ED (erectile dysfunction)   . Essential hypertension, benign   . Hyperlipidemia   . Multiple rib fractures    left 9th, 10th, and 11th posterior rib fractures S/P fall 12/09/2015/notes 12/12/2015  . Prostate cancer ([place])    s/p  . Type II or unspecified type diabetes mellitus without mention of complication, not stated as uncontrolled   . Ventricular tachycardia ([place])    resuscitated; monitor 05/2008; attempted T ablation 2/10- aborted due to inappropriate substrate   Past Surgical History:  Procedure Laterality Date  . CARDIAC  CATHETERIZATION  05/31/08   EF 55-60%, stable lesions, medical therapy  . COLECTOMY  '80's   polyps  . CORONARY ANGIOPLASTY WITH STENT PLACEMENT  01/19/08   PCI stent to mid LAD with Promus DES 27.5x28  . IR GENERIC HISTORICAL  01/03/2016   IR THORACENTESIS ASP PLEURAL SPACE W/IMG GUIDE 01/03/2016 [facility]INTER[facility] RAD  . PTCA  01/2008   Allergies  Allergen Reactions  . Contrast Media [Iodinated Diagnostic Agents] Swelling    Possible neck swelling after contrast for chest CT 03/2016. No hives, throat or other symptoms   Prior to Admission medications   Medication Sig Start Date End Date Taking? Authorizing Provider  atorvastatin (LIPITOR) 10 MG tablet Take 1 tablet (10 mg total) by mouth daily. 11/16/15  Yes [name], MD  Blood Glucose Monitoring Suppl (ACCU-CHEK AVIVA PLUS) w/Device KIT Test blood sugar 3 times daily. Dx code: E11.22 04/03/16  Yes [name], MD  ferrous sulfate 325 (65 FE) MG tablet Take 325 mg by mouth daily with breakfast.   Yes Historical Provider, MD  glucose blood (ACCU-CHEK AVIVA PLUS) test strip Test blood sugar 3 times daily. Dx code: E11.22 04/03/16  Yes [name], MD  HYDROcodone-acetaminophen (NORCO/VICODIN) 5-325 MG tablet Take 1-2 tablets by mouth every 6 (six) hours as needed for moderate pain. 04/10/16  Yes [name], MD  insulin NPH-regular Human (NOVOLIN 70/30) (70-30) 100 UNIT/ML injection Inject 7 Units into the skin 2 (two) times daily with a meal. 11/16/15  Yes [name], MD  Insulin Syringe-Needle U-100 (B-D INS SYR HALF-UNIT .3CC/31G) 31G X 5/16" 0.3 ML MISC Use daily at bedtime to inject insulin. Dx code: 250.00 06/21/13  Yes [name], MD  Lancets (ACCU-CHEK MULTICLIX) lancets Test blood sugar 3 times daily. Dx code: E11.22 04/03/16  Yes [name], MD  lisinopril-hydrochlorothiazide (PRINZIDE,ZESTORETIC) 20-12.5 MG tablet Take 1 tablet by mouth daily. 11/16/15  Yes [name], MD  metFORMIN (GLUCOPHAGE) 1000  MG tablet Take 1 tablet (1,000 mg total) by mouth 2 (two) times daily with a meal. 11/16/15  Yes [name], MD  metoprolol (LOPRESSOR) 50 MG tablet Take 1 tablet (50 mg total) by mouth 2 (two) times daily. 11/16/15  Yes [name], MD  Multiple Vitamin (MULTIVITAMIN) tablet Take 1 tablet by mouth daily.   Yes Historical Provider, MD  NOVOLIN 70/30 (70-30) 100 UNIT/ML injection INJECT 6 UNITS UNDER THE SKIN TWICE DAILY 04/16/16  Yes [name], MD  Omega-3 Fatty Acids (FISH OIL) 1000 MG CAPS Take 2 capsules by mouth daily.    Yes Historical Provider, MD  sildenafil (VIAGRA) 50 MG tablet Take 1 tablet (50 mg total) by mouth daily as needed for erectile dysfunction. 02/08/15  Yes Lorretta Harp, MD  Vitamin D, Cholecalciferol, 1000 UNITS TABS Take 1 tablet by mouth daily.    Yes Historical Provider, MD  Social History   Social History  . Marital status: Legally Separated    Spouse name: [name]  . Number of children: 7  . Years of education: [name]   Occupational History  . RETIRED Retired   Social History Main Topics  . Smoking status: Former Smoker    Packs/day: 0.50    Years: 15.00    Types: Cigarettes    Quit date: 09/01/1990  . Smokeless tobacco: Never Used     Comment: quit 12/07/2013  . Alcohol use No  . Drug use: No  . Sexual activity: No   Other Topics Concern  . Not on file   Social History Narrative   29 grandchildren. Single. Education: 12 th grade. Exercise: 3-4 times a week for 30 minutes working out and set ups.   Review of Systems  Constitutional: Negative for fever.  Respiratory: Positive for shortness of breath. Negative for cough and chest tightness.   Cardiovascular: Negative for chest pain.  Musculoskeletal: Positive for arthralgias.   Objective:  Physical Exam  Constitutional: He appears well-developed and well-nourished. No distress.  HENT:  Head: Normocephalic and atraumatic.  Eyes: Conjunctivae are normal.  Neck: Neck supple.    Cardiovascular: Normal rate.   Nl rate, few ectopic beats  Pulmonary/Chest: Effort normal. No respiratory distress. He has no decreased breath sounds. He has no wheezes. He has no rhonchi. He has rales (few) in the left lower field.  Tender along left lateral chest wall: posterior axillary line to mid scapular line Skin intact/ no ecchymosis Clear air movement  Neurological: He is alert.  Skin: Skin is warm and dry.  Psychiatric: He has a normal mood and affect. His behavior is normal.  Nursing note and vitals reviewed.  BP (!) 170/79 (BP Location: Right Arm, Patient Position: Sitting, Cuff Size: Large)   Pulse 73   Temp 98 F (36.7 C) (Oral)   Ht 6' (1.829 m)   Wt 227 lb (103 kg)   SpO2 99%   BMI 30.79 kg/m    Dg Chest 2 View  Result Date: 05/02/2016 CLINICAL DATA:  Left pleural effusion. EXAM: CHEST  2 VIEW COMPARISON:  Radiographs of March 22, 2016. FINDINGS: Stable cardiomediastinal silhouette. Atherosclerosis of thoracic aorta is noted. No pneumothorax is noted. Right lung is clear. Stable small left pleural effusion is noted with adjacent subsegmental atelectasis or scarring. Stable old left rib fractures. IMPRESSION: Aortic atherosclerosis. Stable small left pleural effusion with adjacent subsegmental atelectasis or scarring. Electronically Signed   By: Marijo Conception, M.D.   On: 05/02/2016 13:18    Assessment & Plan:   [name] is a 83 y.o. male Closed fracture of multiple ribs of left side, subsequent encounter - Plan: HYDROcodone-acetaminophen (NORCO/VICODIN) 5-325 MG tablet Left-sided chest wall pain - Plan: HYDROcodone-acetaminophen (NORCO/VICODIN) 5-325 MG tabletPleural effusion on left - Plan: DG Chest 2 View  - Stable on x-ray today. Increase pain and subjective dyspnea with normal O2 sat in office likely due to increased activity/physical activity at home. Advised to cut back on lifting and offending activities, continue hydrocodone every 6 hours as needed,  recheck in 2 weeks, or one week if dyspnea is not improving with relative rest, sooner if worse.  Closed fracture of multiple ribs of left side with delayed healing, subsequent encounter - Plan: DG Bone Density  - prolonged time of healing, will check bone density if possible to rule out underlying osteoporosis. Continue hydrocodone and if needed, side effects discussed.  Type 2 diabetes  mellitus with complication, with long-term current use of insulin ([place]) - Plan: HM Diabetes Foot Exam   -stable by prior A1c, home readings reportedly stable, no changes for now.   Screening for osteoporosis - Plan: DG Bone Density  - as above.   Hypertension  -Persistent borderline elevated readings, previously when seen by cardiology no med changes advised. Suspect some component of pain, but advised to check blood pressures when he is not in pain to determine if med changes needed.  Meds ordered this encounter  Medications  . HYDROcodone-acetaminophen (NORCO/VICODIN) 5-325 MG tablet    Sig: Take 1-2 tablets by mouth every 6 (six) hours as needed for moderate pain.    Dispense:  30 tablet    Refill:  0   Patient Instructions   Keep a record of your blood pressures outside of the office and bring them to the next office visit. If remaining over 140/90 - especially when pain is controlled, we may need to increase your medication.   I will try to schedule a bone density test as fractures are taking awhile to heal.   Continue hydrocodone for now, but avoid repetitive lifting and other activities that worsen your chest wall/rib pain.   If any increased shortness of breath - return here or emergency room if needed.   I will call you today or tomorrow if any concerns on your xray.     IF you received an x-ray today, you will receive an invoice from [facility]. Please contact [facility] at [phone] with questions or concerns regarding your invoice.   IF you received labwork  today, you will receive an invoice from [place]. Please contact [facility] at [phone] with questions or concerns regarding your invoice.   Our billing staff will not be able to assist you with questions regarding bills from these companies.  You will be contacted with the lab results as soon as they are available. The fastest way to get your results is to activate your My Chart account. Instructions are located on the last page of this paperwork. If you have not heard from [place] regarding the results in 2 weeks, please contact this office.        I personally performed the services described in this documentation, which was scribed in my presence. The recorded information has been reviewed and considered, and addended by me as needed.   Signed,   [name], MD [facility].  05/02/16 1:57 PM

## 2016-05-02 NOTE — Patient Instructions (Addendum)
Keep a record of your blood pressures outside of the office and bring them to the next office visit. If remaining over 140/90 - especially when pain is controlled, we may need to increase your medication.   I will try to schedule a bone density test as fractures are taking awhile to heal.   Continue hydrocodone for now, but avoid repetitive lifting and other activities that worsen your chest wall/rib pain.   If any increased shortness of breath - return here or emergency room if needed.   I will call you today or tomorrow if any concerns on your xray.     IF you received an x-ray today, you will receive an invoice from [facility]. Please contact [facility] at [phone] with questions or concerns regarding your invoice.   IF you received labwork today, you will receive an invoice from [place]. Please contact [facility] at [phone] with questions or concerns regarding your invoice.   Our billing staff will not be able to assist you with questions regarding bills from these companies.  You will be contacted with the lab results as soon as they are available. The fastest way to get your results is to activate your My Chart account. Instructions are located on the last page of this paperwork. If you have not heard from [place] regarding the results in 2 weeks, please contact this office.

## 2016-05-22 ENCOUNTER — Telehealth: Payer: Self-pay

## 2016-05-22 DIAGNOSIS — R0789 Other chest pain: Secondary | ICD-10-CM

## 2016-05-22 DIAGNOSIS — S2242XD Multiple fractures of ribs, left side, subsequent encounter for fracture with routine healing: Secondary | ICD-10-CM

## 2016-05-22 NOTE — Telephone Encounter (Signed)
[place] - Pt has an appointment with you on 06-13-16 but he needs a refill on his hydrocodone.  He says he hates taking this medication but he is still having pain.  [phone]

## 2016-05-22 NOTE — Telephone Encounter (Signed)
05/02/16 last ov and refill 

## 2016-05-23 MED ORDER — HYDROCODONE-ACETAMINOPHEN 5-325 MG PO TABS: 1.0000 | ORAL_TABLET | Freq: Four times a day (QID) | ORAL | 0 refills | Status: DC | PRN

## 2016-05-23 NOTE — Telephone Encounter (Signed)
Pt came by to see if his script was ready.  I saw it printed out at the back desk, but it had not been signed yet.  He wants [place] to call when ready. [phone]

## 2016-05-23 NOTE — Telephone Encounter (Signed)
Noted. Keep follow up, will refill medication, should be ready for pickup later today.

## 2016-05-25 NOTE — Telephone Encounter (Signed)
Picked up 05/24/16

## 2016-05-27 ENCOUNTER — Encounter: Payer: Self-pay | Admitting: Family Medicine

## 2016-05-27 DIAGNOSIS — Z1211 Encounter for screening for malignant neoplasm of colon: Secondary | ICD-10-CM | POA: Diagnosis not present

## 2016-05-27 DIAGNOSIS — K627 Radiation proctitis: Secondary | ICD-10-CM | POA: Diagnosis not present

## 2016-05-27 DIAGNOSIS — Z86010 Personal history of colonic polyps: Secondary | ICD-10-CM | POA: Diagnosis not present

## 2016-05-27 DIAGNOSIS — D122 Benign neoplasm of ascending colon: Secondary | ICD-10-CM | POA: Diagnosis not present

## 2016-05-27 DIAGNOSIS — K635 Polyp of colon: Secondary | ICD-10-CM | POA: Diagnosis not present

## 2016-05-27 DIAGNOSIS — K5520 Angiodysplasia of colon without hemorrhage: Secondary | ICD-10-CM | POA: Diagnosis not present

## 2016-06-12 DIAGNOSIS — M25562 Pain in left knee: Secondary | ICD-10-CM | POA: Diagnosis not present

## 2016-06-12 DIAGNOSIS — M25561 Pain in right knee: Secondary | ICD-10-CM | POA: Diagnosis not present

## 2016-06-12 DIAGNOSIS — G8929 Other chronic pain: Secondary | ICD-10-CM | POA: Diagnosis not present

## 2016-06-12 DIAGNOSIS — M1712 Unilateral primary osteoarthritis, left knee: Secondary | ICD-10-CM | POA: Diagnosis not present

## 2016-06-12 DIAGNOSIS — M1711 Unilateral primary osteoarthritis, right knee: Secondary | ICD-10-CM | POA: Diagnosis not present

## 2016-06-12 DIAGNOSIS — M170 Bilateral primary osteoarthritis of knee: Secondary | ICD-10-CM | POA: Diagnosis not present

## 2016-06-13 ENCOUNTER — Encounter: Payer: Self-pay | Admitting: Family Medicine

## 2016-06-13 ENCOUNTER — Ambulatory Visit (INDEPENDENT_AMBULATORY_CARE_PROVIDER_SITE_OTHER): Payer: Medicare HMO

## 2016-06-13 ENCOUNTER — Ambulatory Visit (INDEPENDENT_AMBULATORY_CARE_PROVIDER_SITE_OTHER): Payer: Medicare HMO | Admitting: Family Medicine

## 2016-06-13 VITALS — BP 190/80 | HR 84 | Temp 98.9°F | Resp 18 | Ht 72.0 in | Wt 227.0 lb

## 2016-06-13 DIAGNOSIS — I10 Essential (primary) hypertension: Secondary | ICD-10-CM | POA: Diagnosis not present

## 2016-06-13 DIAGNOSIS — R0789 Other chest pain: Secondary | ICD-10-CM

## 2016-06-13 DIAGNOSIS — S2242XD Multiple fractures of ribs, left side, subsequent encounter for fracture with routine healing: Secondary | ICD-10-CM

## 2016-06-13 DIAGNOSIS — E119 Type 2 diabetes mellitus without complications: Secondary | ICD-10-CM

## 2016-06-13 DIAGNOSIS — Z794 Long term (current) use of insulin: Secondary | ICD-10-CM

## 2016-06-13 DIAGNOSIS — E785 Hyperlipidemia, unspecified: Secondary | ICD-10-CM

## 2016-06-13 DIAGNOSIS — D649 Anemia, unspecified: Secondary | ICD-10-CM | POA: Diagnosis not present

## 2016-06-13 MED ORDER — LISINOPRIL-HYDROCHLOROTHIAZIDE 20-25 MG PO TABS: 1.0000 | ORAL_TABLET | Freq: Every day | ORAL | 1 refills | Status: DC

## 2016-06-13 MED ORDER — ATORVASTATIN CALCIUM 10 MG PO TABS: 10.0000 mg | ORAL_TABLET | Freq: Every day | ORAL | 1 refills | Status: DC

## 2016-06-13 MED ORDER — HYDROCODONE-ACETAMINOPHEN 5-325 MG PO TABS: 1.0000 | ORAL_TABLET | Freq: Four times a day (QID) | ORAL | 0 refills | Status: DC | PRN

## 2016-06-13 MED ORDER — INSULIN NPH ISOPHANE & REGULAR (70-30) 100 UNIT/ML ~~LOC~~ SUSP: 7.0000 [IU] | Freq: Two times a day (BID) | SUBCUTANEOUS | 2 refills | Status: DC

## 2016-06-13 MED ORDER — METOPROLOL TARTRATE 50 MG PO TABS: 50.0000 mg | ORAL_TABLET | Freq: Two times a day (BID) | ORAL | 1 refills | Status: DC

## 2016-06-13 MED ORDER — METFORMIN HCL 1000 MG PO TABS: 1000.0000 mg | ORAL_TABLET | Freq: Two times a day (BID) | ORAL | 1 refills | Status: DC

## 2016-06-13 NOTE — Patient Instructions (Addendum)
  New blood pressure med dose. Let me know if any lightheadedness or dizziness with that medication. No  Other changes for now.   Call and schedule appointment with eye care provider.   I will check xray again, but will refer you to ortho to discuss the rib pain and low back pain to decide if other treatment needed as you are now 6 months from injury. I will also check to see status of bone density test that was ordered last visit.   Tylenol if only mild pain, hydrocodone if needed for more severe pain only.   Talk to pharmacist about other iron supplements that may be easier on your stomach. I will check your iron test.   IF you received an x-ray today, you will receive an invoice from [facility]. Please contact [facility] at [phone] with questions or concerns regarding your invoice.   IF you received labwork today, you will receive an invoice from [place]. Please contact [facility] at [phone] with questions or concerns regarding your invoice.   Our billing staff will not be able to assist you with questions regarding bills from these companies.  You will be contacted with the lab results as soon as they are available. The fastest way to get your results is to activate your My Chart account. Instructions are located on the last page of this paperwork. If you have not heard from [place] regarding the results in 2 weeks, please contact this office.

## 2016-06-13 NOTE — Progress Notes (Signed)
By signing my name below, I, [name], attest that this documentation has been prepared under the direction and in the presence of Robert Ray, MD.  Electronically Signed: Verlee Acosta, Medical Scribe. 06/13/16. 8:11 AM.  Subjective:    Patient ID: [name], male    DOB: Feb 27, 1934, 83 y.o.   MRN: [id number]  HPI Chief Complaint  Patient presents with  . Follow-up    diabetes    HPI Comments: [name] is a 83 y.o. male who presents to the [facility] for follow-up.  Pt drank 2 cups of coffee with 2 packages of sugar, no creamer.  Rib Fractions/Plural Effusion: He had left sided rib fractions with plural effusion with required thoracentesis x2. Has had some persistent pain on that side with hydrocodone PRN. Last refill of hydrocodone was 05/23/16 Pt feels "half way decent" and he states his bp has been higher since he broke his ribs, initial injury Sept 2017. Pt feels better after taking 2 hydrocodone with 500 mg tylenol for his rib pain and low back pain. Pt has always had a back pain, but it seems to be flaring up recently. Pt has been trying to exercise- not lifting weights.  Contusion: Last CXR Jan 25th, stable small left pleural effusion with adjacent scaring, or atelectasis. He had a CT of his chest Dec 11th 2017, that showed that small left pleural effusion but over all seems to be decreasing with left 8th -11th ribs fracture,  partial healing of 2 of those fractures. Pt has been using his incentive spirometer for SOB. Reports associated sxs of occasioal productive cough that has been "going on for a while". Denies wheezing.  Anemia: Pt discontinued iron supplements "a long time ago" and hasn't been taking them recently. Pt has been taking a multivitamin QD. He was seen by GI and had a colonoscopy 2 weeks ago with 1 small polyp found and removed. Lab Results  Component Value Date   WBC 8.0 03/22/2016   HGB 11.9 (A) 03/22/2016   HCT 35.1  (A) 03/22/2016   MCV 76.5 (A) 03/22/2016   PLT 285 02/22/2016   HTN: He takes lisinopril-HCTZ 20/12.5 QD and metoprolol 50 mg BID. Seen by cardiology who did not recommend any increase in medication. His bp has been remained slightly elevated since his rib fracture, initial injury Sept 2017. Pt's systolic bp has been running in the high 140s and high 150s every now and then depending on his pain level.  BP Readings from Last 3 Encounters:  06/13/16 (!) 190/80  05/02/16 (!) 170/79  03/25/16 (!) 162/90   DM: He was kept on the same dose from last visit.  His blood sugar has been running  110 - 115 , no hypoglycemic episodes. Pt is compliant with his regimen of 7 units of 70/30 insulin, and metformin. Pt's last ophthalmologist visit was over a year ago. Pt has natural bottom teeth and top dentures. He plans on getting his bottom teeth removed and he isn't followed by a dentist due to financial concerns. Lab Results  Component Value Date   HGBA1C 6.8 (H) 02/22/2016   Lab Results  Component Value Date   MICROALBUR 83.3 05/17/2015   HLD: Compliant with lipitor.  Lab Results  Component Value Date   CHOL 136 08/30/2015   HDL 48 08/30/2015   LDLCALC 68 08/30/2015   TRIG 102 08/30/2015   CHOLHDL 2.8 08/30/2015   Lab Results  Component Value Date   ALT  13 (L) 12/12/2015   AST 18 12/12/2015   ALKPHOS 37 (L) 12/12/2015   BILITOT 0.6 12/12/2015    Patient Active Problem List   Diagnosis Date Noted  . Fall 12/14/2015  . Elevated serum creatinine 12/14/2015  . Ileus ([place]) 12/14/2015  . Multiple fractures of ribs of left side 12/12/2015  . Blood in the urine 12/01/2013  . Cystitis, radiation 12/01/2013  . Malignant neoplasm of prostate ([place]) 11/26/2013  . Pulsatile tinnitus 11/19/2013  . Rectal bleeding 12/23/2012  . Type II or unspecified type diabetes mellitus without mention of complication, not stated as uncontrolled   . Essential hypertension, benign   . Chronic back pain   .  ED (erectile dysfunction)   . Anemia   . Hyperlipidemia   . CAD, NATIVE VESSEL 05/08/2008  . VENTRICULAR TACHYCARDIA 05/08/2008  . PREMATURE VENTRICULAR CONTRACTIONS 05/08/2008   Past Medical History:  Diagnosis Date  . Allergy   . CAD (coronary artery disease)    pci to LAD 10/09; stable CAD by cath 05/31/08; myoview 04/11/11- no ishcemia, EF 67%; echo 05/15/09- EF>55%, mod calcification of the aortic valve leaflets  . Chronic back pain   . ED (erectile dysfunction)   . Essential hypertension, benign   . Hyperlipidemia   . Multiple rib fractures    left 9th, 10th, and 11th posterior rib fractures S/P fall 12/09/2015/notes 12/12/2015  . Prostate cancer ([place])    s/p  . Type II or unspecified type diabetes mellitus without mention of complication, not stated as uncontrolled   . Ventricular tachycardia ([place])    resuscitated; monitor 05/2008; attempted T ablation 2/10- aborted due to inappropriate substrate   Past Surgical History:  Procedure Laterality Date  . CARDIAC CATHETERIZATION  05/31/08   EF 55-60%, stable lesions, medical therapy  . COLECTOMY  '80's   polyps  . CORONARY ANGIOPLASTY WITH STENT PLACEMENT  01/19/08   PCI stent to mid LAD with Promus DES 27.5x28  . IR GENERIC HISTORICAL  01/03/2016   IR THORACENTESIS ASP PLEURAL SPACE W/IMG GUIDE 01/03/2016 [facility]  . PTCA  01/2008   Allergies  Allergen Reactions  . Contrast Media [Iodinated Diagnostic Agents] Swelling    Possible neck swelling after contrast for chest CT 03/2016. No hives, throat or other symptoms   Prior to Admission medications   Medication Sig Start Date End Date Taking? Authorizing Provider  atorvastatin (LIPITOR) 10 MG tablet Take 1 tablet (10 mg total) by mouth daily. 11/16/15  Yes [name], MD  Blood Glucose Monitoring Suppl (ACCU-CHEK AVIVA PLUS) w/Device KIT Test blood sugar 3 times daily. Dx code: E11.22 04/03/16  Yes [name], MD  ferrous sulfate 325 (65 FE) MG tablet Take 325 mg by  mouth daily with breakfast.   Yes Historical Provider, MD  glucose blood (ACCU-CHEK AVIVA PLUS) test strip Test blood sugar 3 times daily. Dx code: E11.22 04/03/16  Yes [name], MD  HYDROcodone-acetaminophen (NORCO/VICODIN) 5-325 MG tablet Take 1-2 tablets by mouth every 6 (six) hours as needed for moderate pain. 05/23/16  Yes [name], MD  insulin NPH-regular Human (NOVOLIN 70/30) (70-30) 100 UNIT/ML injection Inject 7 Units into the skin 2 (two) times daily with a meal. 11/16/15  Yes [name], MD  Insulin Syringe-Needle U-100 (B-D INS SYR HALF-UNIT .3CC/31G) 31G X 5/16" 0.3 ML MISC Use daily at bedtime to inject insulin. Dx code: 250.00 06/21/13  Yes [name], MD  Lancets (ACCU-CHEK MULTICLIX) lancets Test blood sugar 3  times daily. Dx code: E11.22 04/03/16  Yes [name], MD  lisinopril-hydrochlorothiazide (PRINZIDE,ZESTORETIC) 20-12.5 MG tablet Take 1 tablet by mouth daily. 11/16/15  Yes [name], MD  metFORMIN (GLUCOPHAGE) 1000 MG tablet Take 1 tablet (1,000 mg total) by mouth 2 (two) times daily with a meal. 11/16/15  Yes [name], MD  metoprolol (LOPRESSOR) 50 MG tablet Take 1 tablet (50 mg total) by mouth 2 (two) times daily. 11/16/15  Yes [name], MD  Multiple Vitamin (MULTIVITAMIN) tablet Take 1 tablet by mouth daily.   Yes Historical Provider, MD  NOVOLIN 70/30 (70-30) 100 UNIT/ML injection INJECT 6 UNITS UNDER THE SKIN TWICE DAILY 04/16/16  Yes [name], MD  Omega-3 Fatty Acids (FISH OIL) 1000 MG CAPS Take 2 capsules by mouth daily.    Yes Historical Provider, MD  sildenafil (VIAGRA) 50 MG tablet Take 1 tablet (50 mg total) by mouth daily as needed for erectile dysfunction. 02/08/15  Yes Lorretta Harp, MD  Vitamin D, Cholecalciferol, 1000 UNITS TABS Take 1 tablet by mouth daily.    Yes Historical Provider, MD   Social History   Social History  . Marital status: Legally Separated    Spouse name: [name]  . Number of  children: 7  . Years of education: [name]   Occupational History  . RETIRED Retired   Social History Main Topics  . Smoking status: Former Smoker    Packs/day: 0.50    Years: 15.00    Types: Cigarettes    Quit date: 09/01/1990  . Smokeless tobacco: Never Used     Comment: quit 12/07/2013  . Alcohol use No  . Drug use: No  . Sexual activity: No   Other Topics Concern  . Not on file   Social History Narrative   29 grandchildren. Single. Education: 12 th grade. Exercise: 3-4 times a week for 30 minutes working out and set ups.   Review of Systems  Constitutional: Positive for activity change. Negative for fatigue and unexpected weight change.  HENT: Negative for dental problem.   Eyes: Negative for visual disturbance.  Respiratory: Positive for shortness of breath. Negative for cough, chest tightness and wheezing.   Cardiovascular: Positive for chest pain (rib fracture). Negative for palpitations and leg swelling.  Gastrointestinal: Negative for abdominal pain and blood in stool.  Genitourinary: Positive for flank pain (rib pain).  Musculoskeletal: Positive for back pain (chronic).  Skin: Negative for rash.  Neurological: Negative for dizziness, light-headedness and headaches.   Objective:  Physical Exam  Constitutional: He is oriented to person, place, and time. He appears well-developed and well-nourished. No distress.  HENT:  Head: Normocephalic and atraumatic.  Eyes: Conjunctivae and EOM are normal. Pupils are equal, round, and reactive to light.  Neck: Neck supple. No JVD present. Carotid bruit is not present.  Cardiovascular: Normal rate, regular rhythm and normal heart sounds.   No murmur heard. Pulmonary/Chest: Effort normal. He has no decreased breath sounds. He has no wheezes. He has no rhonchi. He has rales (few faint) in the left lower field.  Musculoskeletal: He exhibits no edema.  minimal tenderness in left lower spine but primarily left mid to lower ribs at the  mid scapular to the axillary line  Neurological: He is alert and oriented to person, place, and time.  Skin: Skin is warm and dry.  Psychiatric: He has a normal mood and affect. His behavior is normal.  Nursing note and vitals reviewed.   Vitals:  06/13/16 0801  BP: (!) 190/80  Pulse: 84  Resp: 18  Temp: 98.9 F (37.2 C)  TempSrc: Oral  SpO2: 98%  Weight: 227 lb (103 kg)  Height: 6' (1.829 m)  Body mass index is 30.79 kg/m.   Dg Ribs Unilateral W/chest Left  Result Date: 06/13/2016 CLINICAL DATA:  Follow-up multiple left rib fractures EXAM: LEFT RIBS AND CHEST - 3+ VIEW COMPARISON:  03/18/2016 FINDINGS: Four views left ribs submitted. There is healed fracture deformity of the left eighth rib rib. There is partially healed with incomplete union fracture deformity of the left 9 rib. Healed fracture deformity of the left tenth rib. Healed fracture deformity of the left eleventh rib. No acute rib fracture is noted. No pneumothorax. IMPRESSION: No acute rib fracture. No pneumothorax. Old fracture deformity of the left eighth, ninth, tenth and eleventh ribs as described above. Electronically Signed   By: Lahoma Crocker M.D.   On: 06/13/2016 09:16   Assessment & Plan:   MELVERN RAMONE is a 83 y.o. male Type 2 diabetes mellitus without complication, with long-term current use of insulin ([place]) - Plan: Hemoglobin A1C, insulin NPH-regular Human (NOVOLIN 70/30) (70-30) 100 UNIT/ML injection, metFORMIN (GLUCOPHAGE) 1000 MG tablet, Microalbumin, urine  -Prior controlled. Check A1c, continue same dose of 70/30 insulin twice per day as well as metformin 1000 g twice a day.  Essential hypertension - Plan: lisinopril-hydrochlorothiazide (PRINZIDE,ZESTORETIC) 20-25 MG tablet, Comprehensive metabolic panel, metoprolol (LOPRESSOR) 50 MG tablet  - Decreased control, doubt just due to pain.   -increase his HCTZ component of Zestoretic to 25 mg daily, continue metoprolol 50 mg twice a day. Check CMP.  Orthostatic precautions given with higher dose of medication.  Hyperlipidemia, unspecified hyperlipidemia type - Plan: Lipid panel, Comprehensive metabolic panel, atorvastatin (LIPITOR) 10 MG tablet  - Tolerating Lipitor, labs pending, continue same dose.  Closed fracture of multiple ribs of left side with routine healing, subsequent encounter - Plan: DG Ribs Unilateral W/Chest Left, Ambulatory referral to Orthopedic Surgery, HYDROcodone-acetaminophen (NORCO/VICODIN) 5-325 MG tablet Left-sided chest wall pain - Plan: DG Ribs Unilateral W/Chest Left, Ambulatory referral to Orthopedic Surgery, HYDROcodone-acetaminophen (NORCO/VICODIN) 5-325 MG tablet  - Incomplete healing of rib fractures noted as above, but no apparent pleural effusion on x-Acosta today.   -6 months out from injury, with slow healing of fractures. Bone density was previously ordered but not been performed yet. Will check into the status.  -referral to orthopedics for further evaluation due to continued pain.  Anemia, unspecified type - Plan: CBC, Fe+TIBC+Fer, CANCELED: IBC Panel  - Reportedly reassuring colonoscopy in evaluation with GI recently. Repeat CBC and iron testing, and iron supplementation.   Meds ordered this encounter  Medications  . lisinopril-hydrochlorothiazide (PRINZIDE,ZESTORETIC) 20-25 MG tablet    Sig: Take 1 tablet by mouth daily.    Dispense:  90 tablet    Refill:  1  . atorvastatin (LIPITOR) 10 MG tablet    Sig: Take 1 tablet (10 mg total) by mouth daily.    Dispense:  90 tablet    Refill:  1  . HYDROcodone-acetaminophen (NORCO/VICODIN) 5-325 MG tablet    Sig: Take 1-2 tablets by mouth every 6 (six) hours as needed for moderate pain.    Dispense:  30 tablet    Refill:  0  . insulin NPH-regular Human (NOVOLIN 70/30) (70-30) 100 UNIT/ML injection    Sig: Inject 7 Units into the skin 2 (two) times daily with a meal.    Dispense:  10 mL  Refill:  2  . metFORMIN (GLUCOPHAGE) 1000 MG tablet    Sig: Take  1 tablet (1,000 mg total) by mouth 2 (two) times daily with a meal.    Dispense:  180 tablet    Refill:  1  . metoprolol (LOPRESSOR) 50 MG tablet    Sig: Take 1 tablet (50 mg total) by mouth 2 (two) times daily.    Dispense:  180 tablet    Refill:  1   Patient Instructions    New blood pressure med dose. Let me know if any lightheadedness or dizziness with that medication. No  Other changes for now.   Call and schedule appointment with eye care provider.   I will check xray again, but will refer you to ortho to discuss the rib pain and low back pain to decide if other treatment needed as you are now 6 months from injury. I will also check to see status of bone density test that was ordered last visit.   Tylenol if only mild pain, hydrocodone if needed for more severe pain only.   Talk to pharmacist about other iron supplements that may be easier on your stomach. I will check your iron test.   IF you received an x-Acosta today, you will receive an invoice from [facility]. Please contact [facility] at [phone] with questions or concerns regarding your invoice.   IF you received labwork today, you will receive an invoice from [facility]. Please contact [facility] at [phone] with questions or concerns regarding your invoice.   Our billing staff will not be able to assist you with questions regarding bills from these companies.  You will be contacted with the lab results as soon as they are available. The fastest way to get your results is to activate your My Chart account. Instructions are located on the last page of this paperwork. If you have not heard from [place] regarding the results in 2 weeks, please contact this office.      I personally performed the services described in this documentation, which was scribed in my presence. The recorded information has been reviewed and considered for accuracy and completeness, addended by me as needed, and agree with  information above.  Signed,   Robert Ray, MD [facility].  06/13/16 6:57 PM

## 2016-06-14 LAB — COMPREHENSIVE METABOLIC PANEL
ALT: 10 IU/L (ref 0–44)
AST: 13 IU/L (ref 0–40)
Albumin/Globulin Ratio: 1.4 (ref 1.2–2.2)
Albumin: 4 g/dL (ref 3.5–4.7)
Alkaline Phosphatase: 41 IU/L (ref 39–117)
BUN/Creatinine Ratio: 17 (ref 10–24)
BUN: 21 mg/dL (ref 8–27)
Bilirubin Total: 0.2 mg/dL (ref 0.0–1.2)
CO2: 24 mmol/L (ref 18–29)
Calcium: 9.2 mg/dL (ref 8.6–10.2)
Chloride: 102 mmol/L (ref 96–106)
Creatinine, Ser: 1.27 mg/dL (ref 0.76–1.27)
GFR calc Af Amer: 61 mL/min/{1.73_m2} (ref >59)
GFR calc non Af Amer: 53 mL/min/{1.73_m2} — ABNORMAL LOW (ref >59)
Globulin, Total: 2.9 g/dL (ref 1.5–4.5)
Glucose: 129 mg/dL — ABNORMAL HIGH (ref 65–99)
Potassium: 4.3 mmol/L (ref 3.5–5.2)
Sodium: 142 mmol/L (ref 134–144)
Total Protein: 6.9 g/dL (ref 6.0–8.5)

## 2016-06-14 LAB — FE+TIBC+FER
Ferritin: 24 ng/mL — ABNORMAL LOW (ref 30–400)
Iron Saturation: 16 % (ref 15–55)
Iron: 52 ug/dL (ref 38–169)
Total Iron Binding Capacity: 327 ug/dL (ref 250–450)
UIBC: 275 ug/dL (ref 111–343)

## 2016-06-14 LAB — CBC
Hematocrit: 40.3 % (ref 37.5–51.0)
Hemoglobin: 13.1 g/dL (ref 13.0–17.7)
MCH: 25.4 pg — ABNORMAL LOW (ref 26.6–33.0)
MCHC: 32.5 g/dL (ref 31.5–35.7)
MCV: 78 fL — ABNORMAL LOW (ref 79–97)
Platelets: 250 10*3/uL (ref 150–379)
RBC: 5.15 x10E6/uL (ref 4.14–5.80)
RDW: 18.3 % — ABNORMAL HIGH (ref 12.3–15.4)
WBC: 10.4 10*3/uL (ref 3.4–10.8)

## 2016-06-14 LAB — HEMOGLOBIN A1C
Est. average glucose Bld gHb Est-mCnc: 174 mg/dL
Hgb A1c MFr Bld: 7.7 % — ABNORMAL HIGH (ref 4.8–5.6)

## 2016-06-14 LAB — MICROALBUMIN, URINE: Microalbumin, Urine: 4211.6 ug/mL

## 2016-06-14 LAB — LIPID PANEL
Chol/HDL Ratio: 2.9 ratio units (ref 0.0–5.0)
Cholesterol, Total: 155 mg/dL (ref 100–199)
HDL: 54 mg/dL (ref >39)
LDL Calculated: 72 mg/dL (ref 0–99)
Triglycerides: 146 mg/dL (ref 0–149)
VLDL Cholesterol Cal: 29 mg/dL (ref 5–40)

## 2016-06-21 ENCOUNTER — Other Ambulatory Visit: Payer: Self-pay

## 2016-06-21 DIAGNOSIS — E119 Type 2 diabetes mellitus without complications: Secondary | ICD-10-CM

## 2016-06-21 DIAGNOSIS — Z794 Long term (current) use of insulin: Principal | ICD-10-CM

## 2016-06-21 MED ORDER — METFORMIN HCL 1000 MG PO TABS: ORAL_TABLET | ORAL | 1 refills | Status: DC

## 2016-06-21 NOTE — Telephone Encounter (Signed)
Fax req to clarify directions for Metformin 1000mg  tabs Discussed with Dr. Carlota Raspberry.  Will re-order with Sig: take 2 (two) times per day:  one (1000mg ) tab with Breakfast and one (1000mg ) tab with dinner.

## 2016-07-01 ENCOUNTER — Ambulatory Visit (INDEPENDENT_AMBULATORY_CARE_PROVIDER_SITE_OTHER): Payer: Medicare HMO | Admitting: Orthopaedic Surgery

## 2016-07-01 DIAGNOSIS — R0781 Pleurodynia: Secondary | ICD-10-CM | POA: Diagnosis not present

## 2016-07-01 DIAGNOSIS — S2242XD Multiple fractures of ribs, left side, subsequent encounter for fracture with routine healing: Secondary | ICD-10-CM | POA: Diagnosis not present

## 2016-07-01 NOTE — Progress Notes (Signed)
Office Visit Note   Patient: [name]           Date of Birth: 01-03-1934           MRN: [id number] Visit Date: 07/01/2016              Requested by: [name], MD [address] [place], [place] [address] PCP: [name], MD   Assessment & Plan: Visit Diagnoses:  1. Rib pain on left side   2. Closed fracture of multiple ribs of left side with routine healing, subsequent encounter     Plan: Reassurance is given that for recovery from rib fractures Take up to a Year for the Soft Tissues to Feel Better. At This Point His Activity Should Be As Tolerated. I'll See Him Back As Needed.  Follow-Up Instructions: Return if symptoms worsen or fail to improve.   Orders:  No orders of the defined types were placed in this encounter.  No orders of the defined types were placed in this encounter.     Procedures: No procedures performed   Clinical Data: No additional findings.   Subjective: No chief complaint on file.   Patient is a 83 year old gentleman with multiple rib fractures on the left side since December from a mechanical fall. He is overall getting better but still has discomfort when he breathes deeply or when he tries to swing a golf club. Overall he is better. He mainly wants to get it checked out for reassurance.    Review of Systems  Constitutional: Negative.   All other systems reviewed and are negative.    Objective: Vital Signs: There were no vitals taken for this visit.  Physical Exam  Constitutional: He is oriented to person, place, and time. He appears well-developed and well-nourished.  HENT:  Head: Normocephalic and atraumatic.  Eyes: Pupils are equal, round, and reactive to light.  Neck: Neck supple.  Pulmonary/Chest: Effort normal.  Abdominal: Soft.  Musculoskeletal: Normal range of motion.  Neurological: He is alert and oriented to person, place, and time.  Skin: Skin is warm.  Psychiatric: He has a normal mood and  affect. His behavior is normal. Judgment and thought content normal.  Nursing note and vitals reviewed.   Ortho Exam Exam of the left ribs show mild tenderness to palpation over the ribs. There is no crepitus. He has nonlabored breathing. He is able to speak in full sentences. Specialty Comments:  No specialty comments available.  Imaging: No results found.   PMFS History: Patient Active Problem List   Diagnosis Date Noted  . Rib pain on left side 07/01/2016  . Fall 12/14/2015  . Elevated serum creatinine 12/14/2015  . Ileus ([place]) 12/14/2015  . Multiple fractures of ribs of left side 12/12/2015  . Blood in the urine 12/01/2013  . Cystitis, radiation 12/01/2013  . Malignant neoplasm of prostate ([place]) 11/26/2013  . Pulsatile tinnitus 11/19/2013  . Rectal bleeding 12/23/2012  . Type II or unspecified type diabetes mellitus without mention of complication, not stated as uncontrolled   . Essential hypertension, benign   . Chronic back pain   . ED (erectile dysfunction)   . Anemia   . Hyperlipidemia   . CAD, NATIVE VESSEL 05/08/2008  . VENTRICULAR TACHYCARDIA 05/08/2008  . PREMATURE VENTRICULAR CONTRACTIONS 05/08/2008   Past Medical History:  Diagnosis Date  . Allergy   . CAD (coronary artery disease)    pci to LAD 10/09; stable CAD by cath 05/31/08; myoview 04/11/11- no ishcemia, EF  67%; echo 05/15/09- EF>55%, mod calcification of the aortic valve leaflets  . Chronic back pain   . ED (erectile dysfunction)   . Essential hypertension, benign   . Hyperlipidemia   . Multiple rib fractures    left 9th, 10th, and 11th posterior rib fractures S/P fall 12/09/2015/notes 12/12/2015  . Prostate cancer ([place])    s/p  . Type II or unspecified type diabetes mellitus without mention of complication, not stated as uncontrolled   . Ventricular tachycardia ([place])    resuscitated; monitor 05/2008; attempted T ablation 2/10- aborted due to inappropriate substrate    Family History  Problem Relation  Age of Onset  . Diabetes Mother   . Heart attack Mother   . Leukemia Father   . Heart disease Sister   . Heart disease Brother   . Diabetes Brother     pancreatic cancer    Past Surgical History:  Procedure Laterality Date  . CARDIAC CATHETERIZATION  05/31/08   EF 55-60%, stable lesions, medical therapy  . COLECTOMY  '80's   polyps  . CORONARY ANGIOPLASTY WITH STENT PLACEMENT  01/19/08   PCI stent to mid LAD with Promus DES 27.5x28  . IR GENERIC HISTORICAL  01/03/2016   IR THORACENTESIS ASP PLEURAL SPACE W/IMG GUIDE 01/03/2016 [facility]  . PTCA  01/2008   Social History   Occupational History  . RETIRED Retired   Social History Main Topics  . Smoking status: Former Smoker    Packs/day: 0.50    Years: 15.00    Types: Cigarettes    Quit date: 09/01/1990  . Smokeless tobacco: Never Used     Comment: quit 12/07/2013  . Alcohol use No  . Drug use: No  . Sexual activity: No

## 2016-07-02 ENCOUNTER — Encounter: Payer: Self-pay | Admitting: Radiology

## 2016-07-02 ENCOUNTER — Telehealth: Payer: Self-pay | Admitting: Family Medicine

## 2016-07-02 DIAGNOSIS — R0789 Other chest pain: Secondary | ICD-10-CM

## 2016-07-02 DIAGNOSIS — S2242XD Multiple fractures of ribs, left side, subsequent encounter for fracture with routine healing: Secondary | ICD-10-CM

## 2016-07-02 NOTE — Telephone Encounter (Signed)
[place] - Pt is scheduled to come in to see you Saturday.  He is out of his pain medication and is requesting a [phone].

## 2016-07-04 MED ORDER — HYDROCODONE-ACETAMINOPHEN 5-325 MG PO TABS: 1.0000 | ORAL_TABLET | Freq: Four times a day (QID) | ORAL | 0 refills | Status: DC | PRN

## 2016-07-04 NOTE — Telephone Encounter (Signed)
Re ordered

## 2016-07-04 NOTE — Telephone Encounter (Signed)
06/13/16 last refill

## 2016-07-05 NOTE — Telephone Encounter (Signed)
rx up front 

## 2016-07-06 ENCOUNTER — Ambulatory Visit (INDEPENDENT_AMBULATORY_CARE_PROVIDER_SITE_OTHER): Payer: Medicare HMO | Admitting: Family Medicine

## 2016-07-06 VITALS — BP 148/88 | HR 68 | Temp 97.9°F | Resp 18 | Ht 69.0 in | Wt 229.1 lb

## 2016-07-06 DIAGNOSIS — R0789 Other chest pain: Secondary | ICD-10-CM

## 2016-07-06 DIAGNOSIS — S2242XG Multiple fractures of ribs, left side, subsequent encounter for fracture with delayed healing: Secondary | ICD-10-CM | POA: Diagnosis not present

## 2016-07-06 DIAGNOSIS — G4700 Insomnia, unspecified: Secondary | ICD-10-CM

## 2016-07-06 DIAGNOSIS — I10 Essential (primary) hypertension: Secondary | ICD-10-CM | POA: Diagnosis not present

## 2016-07-06 NOTE — Patient Instructions (Addendum)
Continue hydrocodone at night, and see other information for insomnia below. If sleep continues to be an issue, we can talk about that further. Pain should be slowly improving on your ribs. Hydrocodone if needed, tylenol if only mild pain, and avoid heavy lifting or activities that cause that area to be sore.   Blood pressure still too high, but improved with repeat testing. Start the higher dose of blood pressure medicine that was prescribed last visit (lisinopril/hydrochlorothiazide 20/25mg  per day), continue metoprolol same dose.    Rib Fracture A rib fracture is a break or crack in one of the bones of the ribs. The ribs are a group of long, curved bones that wrap around your chest and attach to your spine. They protect your lungs and other organs in the chest cavity. A broken or cracked rib is often painful, but most do not cause other problems. Most rib fractures heal on their own over time. However, rib fractures can be more serious if multiple ribs are broken or if broken ribs move out of place and push against other structures. What are the causes?  A direct blow to the chest. For example, this could happen during contact sports, a car accident, or a fall against a hard object.  Repetitive movements with high force, such as pitching a baseball or having severe coughing spells. What are the signs or symptoms?  Pain when you breathe in or cough.  Pain when someone presses on the injured area. How is this diagnosed? Your caregiver will perform a physical exam. Various imaging tests may be ordered to confirm the diagnosis and to look for related injuries. These tests may include a chest X-ray, computed tomography (CT), magnetic resonance imaging (MRI), or a bone scan. How is this treated? Rib fractures usually heal on their own in 1-3 months. The longer healing period is often associated with a continued cough or other aggravating activities. During the healing period, pain control is very  important. Medication is usually given to control pain. Hospitalization or surgery may be needed for more severe injuries, such as those in which multiple ribs are broken or the ribs have moved out of place. Follow these instructions at home:  Avoid strenuous activity and any activities or movements that cause pain. Be careful during activities and avoid bumping the injured rib.  Gradually increase activity as directed by your caregiver.  Only take over-the-counter or prescription medications as directed by your caregiver. Do not take other medications without asking your caregiver first.  Apply ice to the injured area for the first 1-2 days after you have been treated or as directed by your caregiver. Applying ice helps to reduce inflammation and pain.  Put ice in a plastic bag.  Place a towel between your skin and the bag.  Leave the ice on for 15-20 minutes at a time, every 2 hours while you are awake.  Perform deep breathing as directed by your caregiver. This will help prevent pneumonia, which is a common complication of a broken rib. Your caregiver may instruct you to:  Take deep breaths several times a day.  Try to cough several times a day, holding a pillow against the injured area.  Use a device called an incentive spirometer to practice deep breathing several times a day.  Drink enough fluids to keep your urine clear or pale yellow. This will help you avoid constipation.  Do not wear a rib belt or binder. These restrict breathing, which can lead to pneumonia. Get  help right away if:  You have a fever.  You have difficulty breathing or shortness of breath.  You develop a continual cough, or you cough up thick or bloody sputum.  You feel sick to your stomach (nausea), throw up (vomit), or have abdominal pain.  You have worsening pain not controlled with medications. This information is not intended to replace advice given to you by your health care provider. Make sure  you discuss any questions you have with your health care provider. Document Released: 03/25/2005 Document Revised: 08/31/2015 Document Reviewed: 05/27/2012 Elsevier Interactive Patient Education  2017 [employer]   Chest Wall Pain Chest wall pain is pain in or around the bones and muscles of your chest. Sometimes, an injury causes this pain. Sometimes, the cause may not be known. This pain may take several weeks or longer to get better. Follow these instructions at home: Pay attention to any changes in your symptoms. Take these actions to help with your pain:  Rest as told by your health care provider.  Avoid activities that cause pain. These include any activities that use your chest muscles or your abdominal and side muscles to lift heavy items.  If directed, apply ice to the painful area:  Put ice in a plastic bag.  Place a towel between your skin and the bag.  Leave the ice on for 20 minutes, 2-3 times per day.  Take over-the-counter and prescription medicines only as told by your health care provider.  Do not use tobacco products, including cigarettes, chewing tobacco, and e-cigarettes. If you need help quitting, ask your health care provider.  Keep all follow-up visits as told by your health care provider. This is important. Contact a health care provider if:  You have a fever.  Your chest pain becomes worse.  You have new symptoms. Get help right away if:  You have nausea or vomiting.  You feel sweaty or light-headed.  You have a cough with phlegm (sputum) or you cough up blood.  You develop shortness of breath. This information is not intended to replace advice given to you by your health care provider. Make sure you discuss any questions you have with your health care provider. Document Released: 03/25/2005 Document Revised: 08/03/2015 Document Reviewed: 06/20/2014 Elsevier Interactive Patient Education  2017 [place].  Insomnia Insomnia is a sleep  disorder that makes it difficult to fall asleep or to stay asleep. Insomnia can cause tiredness (fatigue), low energy, difficulty concentrating, mood swings, and poor performance at work or school. There are three different ways to classify insomnia:  Difficulty falling asleep.  Difficulty staying asleep.  Waking up too early in the morning. Any type of insomnia can be long-term (chronic) or short-term (acute). Both are common. Short-term insomnia usually lasts for three months or less. Chronic insomnia occurs at least three times a week for longer than three months. What are the causes? Insomnia may be caused by another condition, situation, or substance, such as:  Anxiety.  Certain medicines.  Gastroesophageal reflux disease (GERD) or other gastrointestinal conditions.  Asthma or other breathing conditions.  Restless legs syndrome, sleep apnea, or other sleep disorders.  Chronic pain.  Menopause. This may include hot flashes.  Stroke.  Abuse of alcohol, tobacco, or illegal drugs.  Depression.  Caffeine.  Neurological disorders, such as Alzheimer disease.  An overactive thyroid (hyperthyroidism). The cause of insomnia may not be known. What increases the risk? Risk factors for insomnia include:  Gender. Women are more commonly affected than  men.  Age. Insomnia is more common as you get older.  Stress. This may involve your professional or personal life.  Income. Insomnia is more common in people with lower income.  Lack of exercise.  Irregular work schedule or night shifts.  Traveling between different time zones. What are the signs or symptoms? If you have insomnia, trouble falling asleep or trouble staying asleep is the main symptom. This may lead to other symptoms, such as:  Feeling fatigued.  Feeling nervous about going to sleep.  Not feeling rested in the morning.  Having trouble concentrating.  Feeling irritable, anxious, or depressed. How is  this treated? Treatment for insomnia depends on the cause. If your insomnia is caused by an underlying condition, treatment will focus on addressing the condition. Treatment may also include:  Medicines to help you sleep.  Counseling or therapy.  Lifestyle adjustments. Follow these instructions at home:  Take medicines only as directed by your health care provider.  Keep regular sleeping and waking hours. Avoid naps.  Keep a sleep diary to help you and your health care provider figure out what could be causing your insomnia. Include:  When you sleep.  When you wake up during the night.  How well you sleep.  How rested you feel the next day.  Any side effects of medicines you are taking.  What you eat and drink.  Make your bedroom a comfortable place where it is easy to fall asleep:  Put up shades or special blackout curtains to block light from outside.  Use a white noise machine to block noise.  Keep the temperature cool.  Exercise regularly as directed by your health care provider. Avoid exercising right before bedtime.  Use relaxation techniques to manage stress. Ask your health care provider to suggest some techniques that may work well for you. These may include:  Breathing exercises.  Routines to release muscle tension.  Visualizing peaceful scenes.  Cut back on alcohol, caffeinated beverages, and cigarettes, especially close to bedtime. These can disrupt your sleep.  Do not overeat or eat spicy foods right before bedtime. This can lead to digestive discomfort that can make it hard for you to sleep.  Limit screen use before bedtime. This includes:  Watching TV.  Using your smartphone, tablet, and computer.  Stick to a routine. This can help you fall asleep faster. Try to do a quiet activity, brush your teeth, and go to bed at the same time each night.  Get out of bed if you are still awake after 15 minutes of trying to sleep. Keep the lights down, but  try reading or doing a quiet activity. When you feel sleepy, go back to bed.  Make sure that you drive carefully. Avoid driving if you feel very sleepy.  Keep all follow-up appointments as directed by your health care provider. This is important. Contact a health care provider if:  You are tired throughout the day or have trouble in your daily routine due to sleepiness.  You continue to have sleep problems or your sleep problems get worse. Get help right away if:  You have serious thoughts about hurting yourself or someone else. This information is not intended to replace advice given to you by your health care provider. Make sure you discuss any questions you have with your health care provider. Document Released: 03/22/2000 Document Revised: 08/25/2015 Document Reviewed: 12/24/2013 Elsevier Interactive Patient Education  2017 [employer].   IF you received an x-ray today, you will receive  an invoice from [facility]. Please contact [facility] at [phone] with questions or concerns regarding your invoice.   IF you received labwork today, you will receive an invoice from [facility]. Please contact [facility] at [phone] with questions or concerns regarding your invoice.   Our billing staff will not be able to assist you with questions regarding bills from these companies.  You will be contacted with the lab results as soon as they are available. The fastest way to get your results is to activate your My Chart account. Instructions are located on the last page of this paperwork. If you have not heard from us regarding the results in 2 weeks, please contact this office.      

## 2016-07-06 NOTE — Progress Notes (Signed)
Subjective:  By signing my name below, I, [name], attest that this documentation has been prepared under the direction and in the presence of Robert Agreste, MD Electronically Signed: Ladene Acosta, ED Scribe 07/06/2016 at 8:23 AM.   Patient ID: [name], male    DOB: 09/08/1933, 83 y.o.   MRN: [id number]  Chief Complaint  Patient presents with  . Follow-up    f/u from ortho appt & from rib fx after recent fall   HPI  [name] is a 83 y.o. male who presents to Primary Care at [facility] with h/o pleural effusion, h/o multiple rib fractures date of injury September 2017, required thoracentesis x2. Continued left-sided chest wall pain requiring hydrocodone few times per day. Pleural effusion as decreasing on previous imaging including CXR January 25. Rib series on March 8 showed healed fractured deformity of left 11th, 10th and 8th rib with incomplete union of 9th rib. Referred to ortho and recommended bone density testing. Saw ortho on March 26. Reassurance was given and advised that it may take up to 1 year for soft tissue to feel better. Today, he reports that sob has improved since last visit. Pt has not had the bone density test done yet due to the testing being costly.   HTN Has been increasing. Initially thought to be due to pain. Reported [id number] systolics on home readings at last visit. He has remained on lisinopril-hctz 20/25 mg qd and metoprolol 50 mg bid. HCTZ was increased from 12.5 to 25 mg at last visit. Evaluated by cardiologist February 21, 2016. BP was 164/68 at that time. Continued on current medications as thought to be due to pain.   Pt has not started lisinopril-hctz 25 mg; states that he was going to finish the 12.5 mg tablets first. He reports consuming ~3 cups of coffee PTA. Pt is taking hydrocodone twice daily with Tylenol. He reports difficulty sleeping due to pain; states that it is difficult getting comfortable at night. He has tried applying a  heating pad to the area without significant relief. Pt denies illicit drug use.  Lab Results  Component Value Date   CREATININE 1.27 06/13/2016    DM See last visit.  Lab Results  Component Value Date   HGBA1C 7.7 (H) 06/13/2016  Plan to remain on same dose insulin but watch diet. Recheck in 3 months.   Patient Active Problem List   Diagnosis Date Noted  . Rib pain on left side 07/01/2016  . Fall 12/14/2015  . Elevated serum creatinine 12/14/2015  . Ileus ([place]) 12/14/2015  . Multiple fractures of ribs of left side 12/12/2015  . Blood in the urine 12/01/2013  . Cystitis, radiation 12/01/2013  . Malignant neoplasm of prostate ([place]) 11/26/2013  . Pulsatile tinnitus 11/19/2013  . Rectal bleeding 12/23/2012  . Type II or unspecified type diabetes mellitus without mention of complication, not stated as uncontrolled   . Essential hypertension, benign   . Chronic back pain   . ED (erectile dysfunction)   . Anemia   . Hyperlipidemia   . CAD, NATIVE VESSEL 05/08/2008  . VENTRICULAR TACHYCARDIA 05/08/2008  . PREMATURE VENTRICULAR CONTRACTIONS 05/08/2008   Past Medical History:  Diagnosis Date  . Allergy   . CAD (coronary artery disease)    pci to LAD 10/09; stable CAD by cath 05/31/08; myoview 04/11/11- no ishcemia, EF 67%; echo 05/15/09- EF>55%, mod calcification of the aortic valve leaflets  . Chronic back pain   . ED (erectile dysfunction)   .  Essential hypertension, benign   . Hyperlipidemia   . Multiple rib fractures    left 9th, 10th, and 11th posterior rib fractures S/P fall 12/09/2015/notes 12/12/2015  . Prostate cancer ([place])    s/p  . Type II or unspecified type diabetes mellitus without mention of complication, not stated as uncontrolled   . Ventricular tachycardia ([place])    resuscitated; monitor 05/2008; attempted T ablation 2/10- aborted due to inappropriate substrate   Past Surgical History:  Procedure Laterality Date  . CARDIAC CATHETERIZATION  05/31/08   EF 55-60%,  stable lesions, medical therapy  . COLECTOMY  '80's   polyps  . CORONARY ANGIOPLASTY WITH STENT PLACEMENT  01/19/08   PCI stent to mid LAD with Promus DES 27.5x28  . IR GENERIC HISTORICAL  01/03/2016   IR THORACENTESIS ASP PLEURAL SPACE W/IMG GUIDE 01/03/2016 [facility]INTER[facility]  . PTCA  01/2008   Allergies  Allergen Reactions  . Contrast Media [Iodinated Diagnostic Agents] Swelling    Possible neck swelling after contrast for chest CT 03/2016. No hives, throat or other symptoms   Prior to Admission medications   Medication Sig Start Date End Date Taking? Authorizing Provider  atorvastatin (LIPITOR) 10 MG tablet Take 1 tablet (10 mg total) by mouth daily. 06/13/16  Yes Robert Agreste, MD  Blood Glucose Monitoring Suppl (ACCU-CHEK AVIVA PLUS) w/Device KIT Test blood sugar 3 times daily. Dx code: E11.22 04/03/16  Yes Robert Agreste, MD  ferrous sulfate 325 (65 FE) MG tablet Take 325 mg by mouth daily with breakfast.   Yes Historical Provider, MD  glucose blood (ACCU-CHEK AVIVA PLUS) test strip Test blood sugar 3 times daily. Dx code: E11.22 04/03/16  Yes Robert Agreste, MD  HYDROcodone-acetaminophen (NORCO/VICODIN) 5-325 MG tablet Take 1-2 tablets by mouth every 6 (six) hours as needed for moderate pain. 07/04/16  Yes Robert Agreste, MD  insulin NPH-regular Human (NOVOLIN 70/30) (70-30) 100 UNIT/ML injection Inject 7 Units into the skin 2 (two) times daily with a meal. 06/13/16  Yes Robert Agreste, MD  Insulin Syringe-Needle U-100 (B-D INS SYR HALF-UNIT .3CC/31G) 31G X 5/16" 0.3 ML MISC Use daily at bedtime to inject insulin. Dx code: 250.00 06/21/13  Yes Robert Agreste, MD  Lancets (ACCU-CHEK MULTICLIX) lancets Test blood sugar 3 times daily. Dx code: E11.22 04/03/16  Yes Robert Agreste, MD  lisinopril-hydrochlorothiazide (PRINZIDE,ZESTORETIC) 20-25 MG tablet Take 1 tablet by mouth daily. 06/13/16  Yes Robert Agreste, MD  metFORMIN (GLUCOPHAGE) 1000 MG tablet take 2 (two) times per day:  one  (1069m) tab with Breakfast and one (10081m tab with dinner. 06/21/16  Yes JeWendie AgresteMD  metoprolol (LOPRESSOR) 50 MG tablet Take 1 tablet (50 mg total) by mouth 2 (two) times daily. 06/13/16  Yes JeWendie AgresteMD  Multiple Vitamin (MULTIVITAMIN) tablet Take 1 tablet by mouth daily.   Yes Historical Provider, MD  Omega-3 Fatty Acids (FISH OIL) 1000 MG CAPS Take 2 capsules by mouth daily.    Yes Historical Provider, MD  sildenafil (VIAGRA) 50 MG tablet Take 1 tablet (50 mg total) by mouth daily as needed for erectile dysfunction. 02/08/15  Yes JoLorretta HarpMD  Vitamin D, Cholecalciferol, 1000 UNITS TABS Take 1 tablet by mouth daily.    Yes Historical Provider, MD   Social History   Social History  . Marital status: Legally Separated    Spouse name: [name]  . Number of children: 7  . Years of education: [name]  Occupational History  . RETIRED Retired   Social History Main Topics  . Smoking status: Former Smoker    Packs/day: 0.50    Years: 15.00    Types: Cigarettes    Quit date: 09/01/1990  . Smokeless tobacco: Never Used     Comment: quit 12/07/2013  . Alcohol use No  . Drug use: No  . Sexual activity: No   Other Topics Concern  . Not on file   Social History Narrative   29 grandchildren. Single. Education: 12 th grade. Exercise: 3-4 times a week for 30 minutes working out and set ups.   Review of Systems  Constitutional: Negative for fatigue and unexpected weight change.  Eyes: Negative for visual disturbance.  Respiratory: Negative for cough, chest tightness and shortness of breath.   Cardiovascular: Negative for chest pain, palpitations and leg swelling.  Gastrointestinal: Negative for abdominal pain and blood in stool.  Musculoskeletal: Positive for myalgias (rib pain).  Neurological: Negative for dizziness, light-headedness and headaches.      Objective:   Physical Exam  Constitutional: He is oriented to person, place, and time. He appears well-developed and  well-nourished.  HENT:  Head: Normocephalic and atraumatic.  Eyes: EOM are normal. Pupils are equal, round, and reactive to light.  Neck: No JVD present. Carotid bruit is not present.  Cardiovascular: Normal rate, regular rhythm and normal heart sounds.   No murmur heard. Pulmonary/Chest: Effort normal and breath sounds normal. He has no rales.  Lungs are clear to auscultation.   Musculoskeletal: He exhibits no edema.  Neurological: He is alert and oriented to person, place, and time.  Skin: Skin is warm and dry.  Psychiatric: He has a normal mood and affect.  Vitals reviewed.   Vitals:   07/06/16 0810 07/06/16 0843 07/06/16 0844  BP: (!) 208/85 (!) 152/98 (!) 148/88  Pulse: 61 68   Resp: 18    Temp: 97.9 F (36.6 C)    TempSrc: Oral    SpO2: 98%    Weight: 229 lb 2 oz (103.9 kg)    Height: _0  (1.753 m)        Assessment & Plan:   GERALD KUEHL is a 83 y.o. male Closed fracture of multiple ribs of left side with delayed healing, subsequent encounter Chest wall pain Insomnia, unspecified type  - Multiple left-sided rib fractures from September 2017. Status post orthopedic evaluation. On last imaging, 3 out of the 4 fractures appear to be healed, 1 with potential nonunion area slow but persistent improvement in chest wall pain. Lungs clear today, reassuring vital signs other than initial elevated blood pressure.  -Continue Tylenol or hydrocodone over-the-counter as needed for chest wall pain, anticipate need will decrease as pain improves. Avoid activities that worsen pain or heavy lifting.  -Insomnia likely due to chest wall pain at night, continue hydrocodone as needed, handout given on insomnia, return to discuss further if persistent issue.  -With the multiple fractures and time to heal, discussed bone density testing.  Declined at this time due to cost. If he changes his mind, can repeat order.  Essential hypertension  -Elevated on initial eval, improved with repeat  testing, but still elevated. Start new dose of lisinopril/HCTZ with 25 mg of HCTZ in it. Recheck in 3 months, sooner if needed  No orders of the defined types were placed in this encounter.  Patient Instructions    Continue hydrocodone at night, and see other information for insomnia below. If sleep continues to be  an issue, we can talk about that further. Pain should be slowly improving on your ribs. Hydrocodone if needed, tylenol if only mild pain, and avoid heavy lifting or activities that cause that area to be sore.   Blood pressure still too high, but improved with repeat testing. Start the higher dose of blood pressure medicine that was prescribed last visit (lisinopril/hydrochlorothiazide 20/67m per day), continue metoprolol same dose.    Rib Fracture A rib fracture is a break or crack in one of the bones of the ribs. The ribs are a group of long, curved bones that wrap around your chest and attach to your spine. They protect your lungs and other organs in the chest cavity. A broken or cracked rib is often painful, but most do not cause other problems. Most rib fractures heal on their own over time. However, rib fractures can be more serious if multiple ribs are broken or if broken ribs move out of place and push against other structures. What are the causes?  A direct blow to the chest. For example, this could happen during contact sports, a car accident, or a fall against a hard object.  Repetitive movements with high force, such as pitching a baseball or having severe coughing spells. What are the signs or symptoms?  Pain when you breathe in or cough.  Pain when someone presses on the injured area. How is this diagnosed? Your caregiver will perform a physical exam. Various imaging tests may be ordered to confirm the diagnosis and to look for related injuries. These tests may include a chest X-ray, computed tomography (CT), magnetic resonance imaging (MRI), or a bone scan. How is  this treated? Rib fractures usually heal on their own in 1-3 months. The longer healing period is often associated with a continued cough or other aggravating activities. During the healing period, pain control is very important. Medication is usually given to control pain. Hospitalization or surgery may be needed for more severe injuries, such as those in which multiple ribs are broken or the ribs have moved out of place. Follow these instructions at home:  Avoid strenuous activity and any activities or movements that cause pain. Be careful during activities and avoid bumping the injured rib.  Gradually increase activity as directed by your caregiver.  Only take over-the-counter or prescription medications as directed by your caregiver. Do not take other medications without asking your caregiver first.  Apply ice to the injured area for the first 1-2 days after you have been treated or as directed by your caregiver. Applying ice helps to reduce inflammation and pain.  Put ice in a plastic bag.  Place a towel between your skin and the bag.  Leave the ice on for 15-20 minutes at a time, every 2 hours while you are awake.  Perform deep breathing as directed by your caregiver. This will help prevent pneumonia, which is a common complication of a broken rib. Your caregiver may instruct you to:  Take deep breaths several times a day.  Try to cough several times a day, holding a pillow against the injured area.  Use a device called an incentive spirometer to practice deep breathing several times a day.  Drink enough fluids to keep your urine clear or pale yellow. This will help you avoid constipation.  Do not wear a rib belt or binder. These restrict breathing, which can lead to pneumonia. Get help right away if:  You have a fever.  You have difficulty breathing or shortness  of breath.  You develop a continual cough, or you cough up thick or bloody sputum.  You feel sick to your  stomach (nausea), throw up (vomit), or have abdominal pain.  You have worsening pain not controlled with medications. This information is not intended to replace advice given to you by your health care provider. Make sure you discuss any questions you have with your health care provider. Document Released: 03/25/2005 Document Revised: 08/31/2015 Document Reviewed: 05/27/2012 Elsevier Interactive Patient Education  2017 Elsevier Inc.   Chest Wall Pain Chest wall pain is pain in or around the bones and muscles of your chest. Sometimes, an injury causes this pain. Sometimes, the cause may not be known. This pain may take several weeks or longer to get better. Follow these instructions at home: Pay attention to any changes in your symptoms. Take these actions to help with your pain:  Rest as told by your health care provider.  Avoid activities that cause pain. These include any activities that use your chest muscles or your abdominal and side muscles to lift heavy items.  If directed, apply ice to the painful area:  Put ice in a plastic bag.  Place a towel between your skin and the bag.  Leave the ice on for 20 minutes, 2-3 times per day.  Take over-the-counter and prescription medicines only as told by your health care provider.  Do not use tobacco products, including cigarettes, chewing tobacco, and e-cigarettes. If you need help quitting, ask your health care provider.  Keep all follow-up visits as told by your health care provider. This is important. Contact a health care provider if:  You have a fever.  Your chest pain becomes worse.  You have new symptoms. Get help right away if:  You have nausea or vomiting.  You feel sweaty or light-headed.  You have a cough with phlegm (sputum) or you cough up blood.  You develop shortness of breath. This information is not intended to replace advice given to you by your health care provider. Make sure you discuss any questions you  have with your health care provider. Document Released: 03/25/2005 Document Revised: 08/03/2015 Document Reviewed: 06/20/2014 Elsevier Interactive Patient Education  2017 [employer].  Insomnia Insomnia is a sleep disorder that makes it difficult to fall asleep or to stay asleep. Insomnia can cause tiredness (fatigue), low energy, difficulty concentrating, mood swings, and poor performance at work or school. There are three different ways to classify insomnia:  Difficulty falling asleep.  Difficulty staying asleep.  Waking up too early in the morning. Any type of insomnia can be long-term (chronic) or short-term (acute). Both are common. Short-term insomnia usually lasts for three months or less. Chronic insomnia occurs at least three times a week for longer than three months. What are the causes? Insomnia may be caused by another condition, situation, or substance, such as:  Anxiety.  Certain medicines.  Gastroesophageal reflux disease (GERD) or other gastrointestinal conditions.  Asthma or other breathing conditions.  Restless legs syndrome, sleep apnea, or other sleep disorders.  Chronic pain.  Menopause. This may include hot flashes.  Stroke.  Abuse of alcohol, tobacco, or illegal drugs.  Depression.  Caffeine.  Neurological disorders, such as Alzheimer disease.  An overactive thyroid (hyperthyroidism). The cause of insomnia may not be known. What increases the risk? Risk factors for insomnia include:  Gender. Women are more commonly affected than men.  Age. Insomnia is more common as you get older.  Stress. This may involve  your professional or personal life.  Income. Insomnia is more common in people with lower income.  Lack of exercise.  Irregular work schedule or night shifts.  Traveling between different time zones. What are the signs or symptoms? If you have insomnia, trouble falling asleep or trouble staying asleep is the main symptom. This may  lead to other symptoms, such as:  Feeling fatigued.  Feeling nervous about going to sleep.  Not feeling rested in the morning.  Having trouble concentrating.  Feeling irritable, anxious, or depressed. How is this treated? Treatment for insomnia depends on the cause. If your insomnia is caused by an underlying condition, treatment will focus on addressing the condition. Treatment may also include:  Medicines to help you sleep.  Counseling or therapy.  Lifestyle adjustments. Follow these instructions at home:  Take medicines only as directed by your health care provider.  Keep regular sleeping and waking hours. Avoid naps.  Keep a sleep diary to help you and your health care provider figure out what could be causing your insomnia. Include:  When you sleep.  When you wake up during the night.  How well you sleep.  How rested you feel the next day.  Any side effects of medicines you are taking.  What you eat and drink.  Make your bedroom a comfortable place where it is easy to fall asleep:  Put up shades or special blackout curtains to block light from outside.  Use a white noise machine to block noise.  Keep the temperature cool.  Exercise regularly as directed by your health care provider. Avoid exercising right before bedtime.  Use relaxation techniques to manage stress. Ask your health care provider to suggest some techniques that may work well for you. These may include:  Breathing exercises.  Routines to release muscle tension.  Visualizing peaceful scenes.  Cut back on alcohol, caffeinated beverages, and cigarettes, especially close to bedtime. These can disrupt your sleep.  Do not overeat or eat spicy foods right before bedtime. This can lead to digestive discomfort that can make it hard for you to sleep.  Limit screen use before bedtime. This includes:  Watching TV.  Using your smartphone, tablet, and computer.  Stick to a routine. This can help  you fall asleep faster. Try to do a quiet activity, brush your teeth, and go to bed at the same time each night.  Get out of bed if you are still awake after 15 minutes of trying to sleep. Keep the lights down, but try reading or doing a quiet activity. When you feel sleepy, go back to bed.  Make sure that you drive carefully. Avoid driving if you feel very sleepy.  Keep all follow-up appointments as directed by your health care provider. This is important. Contact a health care provider if:  You are tired throughout the day or have trouble in your daily routine due to sleepiness.  You continue to have sleep problems or your sleep problems get worse. Get help right away if:  You have serious thoughts about hurting yourself or someone else. This information is not intended to replace advice given to you by your health care provider. Make sure you discuss any questions you have with your health care provider. Document Released: 03/22/2000 Document Revised: 08/25/2015 Document Reviewed: 12/24/2013 Elsevier Interactive Patient Education  2017 [employer].   IF you received an x-ray today, you will receive an invoice from [facility]. Please contact [facility] at [phone] with questions or concerns regarding  your invoice.   IF you received labwork today, you will receive an invoice from [facility]. Please contact [facility] at [phone] with questions or concerns regarding your invoice.   Our billing staff will not be able to assist you with questions regarding bills from these companies.  You will be contacted with the lab results as soon as they are available. The fastest way to get your results is to activate your My Chart account. Instructions are located on the last page of this paperwork. If you have not heard from [place] regarding the results in 2 weeks, please contact this office.       /I personally performed the services described in this documentation, which  was scribed in my presence. The recorded information has been reviewed and considered for accuracy and completeness, addended by me as needed, and agree with information above.  Signed,   [name], MD Primary [facility] at [place].  07/06/16 8:47 AM

## 2016-07-15 ENCOUNTER — Other Ambulatory Visit: Payer: Self-pay | Admitting: Family Medicine

## 2016-07-23 ENCOUNTER — Telehealth: Payer: Self-pay | Admitting: Family Medicine

## 2016-07-23 DIAGNOSIS — R0789 Other chest pain: Secondary | ICD-10-CM

## 2016-07-23 DIAGNOSIS — S2242XD Multiple fractures of ribs, left side, subsequent encounter for fracture with routine healing: Secondary | ICD-10-CM

## 2016-07-23 MED ORDER — HYDROCODONE-ACETAMINOPHEN 5-325 MG PO TABS: 1.0000 | ORAL_TABLET | Freq: Four times a day (QID) | ORAL | 0 refills | Status: DC | PRN

## 2016-07-23 NOTE — Telephone Encounter (Signed)
Refilled and ready for pickup.

## 2016-07-23 NOTE — Telephone Encounter (Signed)
Pt has had two deaths in his family and is needing to go out of town and would like to pick up his refill of pain meds on Thursday   He wanted to tell dr Carlota Raspberry that he only takes two a day   Best number [phone]

## 2016-07-24 ENCOUNTER — Encounter: Payer: Self-pay | Admitting: Physician Assistant

## 2016-07-24 ENCOUNTER — Ambulatory Visit (INDEPENDENT_AMBULATORY_CARE_PROVIDER_SITE_OTHER): Payer: Medicare HMO | Admitting: Physician Assistant

## 2016-07-24 VITALS — BP 148/74 | HR 79 | Temp 97.8°F | Resp 18 | Ht 69.0 in | Wt 228.0 lb

## 2016-07-24 DIAGNOSIS — R61 Generalized hyperhidrosis: Secondary | ICD-10-CM

## 2016-07-24 DIAGNOSIS — F430 Acute stress reaction: Secondary | ICD-10-CM

## 2016-07-24 LAB — POCT CBC
Granulocyte percent: 79.7 %G (ref 37–80)
HCT, POC: 38.3 % — AB (ref 43.5–53.7)
Hemoglobin: 13 g/dL — AB (ref 14.1–18.1)
Lymph, poc: 1.5 (ref 0.6–3.4)
MCH, POC: 27.1 pg (ref 27–31.2)
MCHC: 34.1 g/dL (ref 31.8–35.4)
MCV: 79.7 fL — AB (ref 80–97)
MID (cbc): 0.4 (ref 0–0.9)
MPV: 8.7 fL (ref 0–99.8)
POC Granulocyte: 7.6 — AB (ref 2–6.9)
POC LYMPH PERCENT: 15.6 %L (ref 10–50)
POC MID %: 4.7 %M (ref 0–12)
Platelet Count, POC: 206 10*3/uL (ref 142–424)
RBC: 4.81 M/uL (ref 4.69–6.13)
RDW, POC: 17.7 %
WBC: 9.5 10*3/uL (ref 4.6–10.2)

## 2016-07-24 LAB — POCT URINALYSIS DIP (MANUAL ENTRY)
Bilirubin, UA: NEGATIVE
Blood, UA: NEGATIVE
Glucose, UA: NEGATIVE mg/dL
Ketones, POC UA: NEGATIVE mg/dL
Leukocytes, UA: NEGATIVE
Nitrite, UA: NEGATIVE
Protein Ur, POC: >=300 mg/dL — AB
Spec Grav, UA: 1.025 (ref 1.010–1.025)
Urobilinogen, UA: 0.2 E.U./dL
pH, UA: 5 (ref 5.0–8.0)

## 2016-07-24 LAB — GLUCOSE, POCT (MANUAL RESULT ENTRY): POC Glucose: 181 mg/dl — AB (ref 70–99)

## 2016-07-24 MED ORDER — BUSPIRONE HCL 5 MG PO TABS: 2.5000 mg | ORAL_TABLET | Freq: Three times a day (TID) | ORAL | 0 refills | Status: DC

## 2016-07-24 NOTE — Patient Instructions (Signed)
     IF you received an x-ray today, you will receive an invoice from [facility]. Please contact [facility] at [phone] with questions or concerns regarding your invoice.   IF you received labwork today, you will receive an invoice from [facility]. Please contact [facility] at [phone] with questions or concerns regarding your invoice.   Our billing staff will not be able to assist you with questions regarding bills from these companies.  You will be contacted with the lab results as soon as they are available. The fastest way to get your results is to activate your My Chart account. Instructions are located on the last page of this paperwork. If you have not heard from us regarding the results in 2 weeks, please contact this office.     
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
## 2016-08-12 ENCOUNTER — Other Ambulatory Visit: Payer: Self-pay | Admitting: Family Medicine

## 2016-08-14 ENCOUNTER — Telehealth: Payer: Self-pay | Admitting: Family Medicine

## 2016-08-14 DIAGNOSIS — S2242XD Multiple fractures of ribs, left side, subsequent encounter for fracture with routine healing: Secondary | ICD-10-CM

## 2016-08-14 DIAGNOSIS — R0789 Other chest pain: Secondary | ICD-10-CM

## 2016-08-14 NOTE — Telephone Encounter (Signed)
[facility] - Pt needs a refill on the hydrocodone [phone]

## 2016-08-15 DIAGNOSIS — C61 Malignant neoplasm of prostate: Secondary | ICD-10-CM | POA: Diagnosis not present

## 2016-08-15 NOTE — Telephone Encounter (Signed)
Patient requesting refill on Hydrocodone

## 2016-08-16 MED ORDER — HYDROCODONE-ACETAMINOPHEN 5-325 MG PO TABS: 1.0000 | ORAL_TABLET | Freq: Four times a day (QID) | ORAL | 0 refills | Status: DC | PRN

## 2016-08-16 NOTE — Telephone Encounter (Signed)
Ordered, signed and ready for pickup.

## 2016-08-16 NOTE — Telephone Encounter (Signed)
Attempted to call pt to advise script is at front desk. Unable to leave VM. Called wife's number - name given did not match pt's demographic.  Put upfront for pick up if pt calls.

## 2016-08-22 DIAGNOSIS — C61 Malignant neoplasm of prostate: Secondary | ICD-10-CM | POA: Diagnosis not present

## 2016-08-22 DIAGNOSIS — N529 Male erectile dysfunction, unspecified: Secondary | ICD-10-CM | POA: Diagnosis not present

## 2016-09-03 ENCOUNTER — Encounter: Payer: Self-pay | Admitting: Family Medicine

## 2016-09-03 ENCOUNTER — Ambulatory Visit (INDEPENDENT_AMBULATORY_CARE_PROVIDER_SITE_OTHER): Payer: Medicare HMO

## 2016-09-03 ENCOUNTER — Ambulatory Visit (INDEPENDENT_AMBULATORY_CARE_PROVIDER_SITE_OTHER): Payer: Medicare HMO | Admitting: Family Medicine

## 2016-09-03 VITALS — BP 146/82 | HR 68 | Temp 98.1°F | Resp 16 | Ht 69.0 in | Wt 226.0 lb

## 2016-09-03 DIAGNOSIS — S2242XG Multiple fractures of ribs, left side, subsequent encounter for fracture with delayed healing: Secondary | ICD-10-CM

## 2016-09-03 DIAGNOSIS — E1122 Type 2 diabetes mellitus with diabetic chronic kidney disease: Secondary | ICD-10-CM

## 2016-09-03 DIAGNOSIS — G8929 Other chronic pain: Secondary | ICD-10-CM

## 2016-09-03 DIAGNOSIS — I10 Essential (primary) hypertension: Secondary | ICD-10-CM | POA: Diagnosis not present

## 2016-09-03 DIAGNOSIS — Z794 Long term (current) use of insulin: Secondary | ICD-10-CM | POA: Diagnosis not present

## 2016-09-03 DIAGNOSIS — M545 Low back pain, unspecified: Secondary | ICD-10-CM

## 2016-09-03 DIAGNOSIS — R0789 Other chest pain: Secondary | ICD-10-CM

## 2016-09-03 DIAGNOSIS — S2241XA Multiple fractures of ribs, right side, initial encounter for closed fracture: Secondary | ICD-10-CM | POA: Diagnosis not present

## 2016-09-03 DIAGNOSIS — S2242XD Multiple fractures of ribs, left side, subsequent encounter for fracture with routine healing: Secondary | ICD-10-CM | POA: Diagnosis not present

## 2016-09-03 MED ORDER — HYDROCODONE-ACETAMINOPHEN 5-325 MG PO TABS: 1.0000 | ORAL_TABLET | Freq: Four times a day (QID) | ORAL | 0 refills | Status: DC | PRN
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
## 2016-09-03 NOTE — Patient Instructions (Addendum)
  Hydrocodone refilled to take up to every 4-6 hours as needed. Try Tylenol first. If you require more than twice per day dosing, follow-up so we can discuss other options. If pain is worsening, follow-up. Otherwise keep appointment in the next 1 month for diabetes recheck.  I'm sorry to hear about the losses in the family. If you are feeling depressed, or having trouble with grieving, please return to discuss this further, or I can provide some names of counselors if you would like.    IF you received an x-ray today, you will receive an invoice from [facility]. Please contact [facility]iology at [phone] with questions or concerns regarding your invoice.   IF you received labwork today, you will receive an invoice from [facility]. Please contact [facility] at [phone] with questions or concerns regarding your invoice.   Our billing staff will not be able to assist you with questions regarding bills from these companies.  You will be contacted with the lab results as soon as they are available. The fastest way to get your results is to activate your My Chart account. Instructions are located on the last page of this paperwork. If you have not heard from [place] regarding the results in 2 weeks, please contact this office.

## 2016-09-19 ENCOUNTER — Ambulatory Visit: Payer: Medicare HMO | Admitting: Family Medicine

## 2016-10-07 ENCOUNTER — Telehealth: Payer: Self-pay | Admitting: Family Medicine

## 2016-10-07 DIAGNOSIS — R0789 Other chest pain: Secondary | ICD-10-CM

## 2016-10-07 DIAGNOSIS — S2242XD Multiple fractures of ribs, left side, subsequent encounter for fracture with routine healing: Secondary | ICD-10-CM

## 2016-10-07 NOTE — Telephone Encounter (Signed)
Pt is needing to get about 20 pain pills to cover him til he sees Dr. Carlota Raspberry on the 06-27-2022 he has had another death in his family and will be leaving to go out of town on Friday   Best number [phone]

## 2016-10-08 NOTE — Telephone Encounter (Signed)
Please advise 

## 2016-10-09 NOTE — Telephone Encounter (Signed)
He is on hydrocodone for rib fractures. #45 Rx on 09/03/16. Ok to refill for additional # 30, as appointment pending 10/17/16. Will route to Windell Hummingbird, PA-C as I am out of town.

## 2016-10-10 ENCOUNTER — Ambulatory Visit: Payer: Medicare HMO | Admitting: Family Medicine

## 2016-10-10 NOTE — Telephone Encounter (Signed)
PT LOOKING FOR RX HYDROCODONE

## 2016-10-10 NOTE — Telephone Encounter (Signed)
Meant to send to Judson Roch

## 2016-10-10 NOTE — Telephone Encounter (Signed)
Please see previous message from Dr. Carlota Raspberry

## 2016-10-11 MED ORDER — HYDROCODONE-ACETAMINOPHEN 5-325 MG PO TABS: 1.0000 | ORAL_TABLET | Freq: Four times a day (QID) | ORAL | 0 refills | Status: DC | PRN

## 2016-10-11 NOTE — Telephone Encounter (Signed)
Done

## 2016-10-17 ENCOUNTER — Ambulatory Visit (INDEPENDENT_AMBULATORY_CARE_PROVIDER_SITE_OTHER): Payer: Medicare HMO | Admitting: Family Medicine

## 2016-10-17 ENCOUNTER — Encounter: Payer: Self-pay | Admitting: Family Medicine

## 2016-10-17 VITALS — BP 126/71 | HR 97 | Temp 98.0°F | Resp 16 | Ht 69.0 in | Wt 199.8 lb

## 2016-10-17 DIAGNOSIS — S2242XG Multiple fractures of ribs, left side, subsequent encounter for fracture with delayed healing: Secondary | ICD-10-CM

## 2016-10-17 DIAGNOSIS — E119 Type 2 diabetes mellitus without complications: Secondary | ICD-10-CM

## 2016-10-17 DIAGNOSIS — Z794 Long term (current) use of insulin: Secondary | ICD-10-CM | POA: Diagnosis not present

## 2016-10-17 NOTE — Progress Notes (Signed)
Subjective:  This chart was scribed for Robert Agreste, MD by [name], at [place] at [facility].  This patient was seen in room 25 and the patient's care was started at 9:04 AM.   Chief Complaint  Patient presents with  . Follow-up    fracture ribs     Patient ID: [name], male    DOB: 1933/10/31, 83 y.o.   MRN: [id number]  HPI HPI Comments: [name] is a 83 y.o. male who presents to Primary Care at [facility] for a follow up of rib fracture pain.  Initial injury on September 2017.  He had multiple left sided rib fractures 8-11.  Evaluated by ortho in March advised that it may take up to a year for discomfort to resolve.  Initial pleural effusion but only small effusion January 2018.  Most recent rib series May 29 th. Incomplete healing 9th and 10 th ribs.  But stable pleural thickening to the left lung base.  His pain has been treated with hydrocodone 5-325 Average approximately 2 per day. Worse with increased activity or lifting or traveling.  We dicussed trying tylenol first then hydrocodone if needed.  He received 45 hydrocodone on May 29 th. Then #45 on July 6th. --- Patient states that he still has difficulty sleeping on his left side and feels that he is about "85% better".  He has not yet picked up his hydrocodone which was requested on July 3rd and states that he has not yet gotten a call regarding the refill.  He usually takes about one per day but does take two on some days.   He would like to go back to playing golf but is unable to due to the pain. Patient denies any side effects with his medications.   Lower back pain: he has had chronic lower back pain with degenerative disc disease.  But hydrocodone has been used primarily for rib pain.  He was not on chronic narcotics prior to rib injury.  Not c/o of back pain at this visit.  Diabetes: He is on 70-30 insulin100 unit/ml injection insulin. 7 units BID and metformin 1000 mg BID. He does take an ace  inhibitor.  He is not taking aspirin due to history of rectal bleeding. He is on Lipitor as statin.  Lab Results  Component Value Date   HGBA1C 7.7 (H) 06/13/2016   Lab Results  Component Value Date   MICROALBUR 83.3 05/17/2015  Patient has an appointment with the eye doctor on the 17 th of this month.    Patient had 6 teeth pulled since his last visit and was given tylenol with codeine, but not using that for the pain. He had a nephew who passed away recently and will be attending the funeral today.  He also had a friend pass recently. Patient denies any depression and is willing to come in if he ever feels like he needs to talk about the recent losses.    Patient Active Problem List   Diagnosis Date Noted  . Rib pain on left side 07/01/2016  . Fall 12/14/2015  . Elevated serum creatinine 12/14/2015  . Ileus ([place]) 12/14/2015  . Multiple fractures of ribs of left side 12/12/2015  . Blood in the urine 12/01/2013  . Cystitis, radiation 12/01/2013  . Malignant neoplasm of prostate ([place]) 11/26/2013  . Pulsatile tinnitus 11/19/2013  . Rectal bleeding 12/23/2012  . Type II or unspecified type diabetes mellitus without mention of complication, not stated as  uncontrolled   . Essential hypertension, benign   . Chronic back pain   . ED (erectile dysfunction)   . Anemia   . Hyperlipidemia   . CAD, NATIVE VESSEL 05/08/2008  . VENTRICULAR TACHYCARDIA 05/08/2008  . PREMATURE VENTRICULAR CONTRACTIONS 05/08/2008   Past Medical History:  Diagnosis Date  . Allergy   . CAD (coronary artery disease)    pci to LAD 10/09; stable CAD by cath 05/31/08; myoview 04/11/11- no ishcemia, EF 67%; echo 05/15/09- EF>55%, mod calcification of the aortic valve leaflets  . Chronic back pain   . ED (erectile dysfunction)   . Essential hypertension, benign   . Hyperlipidemia   . Multiple rib fractures    left 9th, 10th, and 11th posterior rib fractures S/P fall 12/09/2015/notes 12/12/2015  . Prostate cancer ([place])      s/p  . Type II or unspecified type diabetes mellitus without mention of complication, not stated as uncontrolled   . Ventricular tachycardia ([place])    resuscitated; monitor 05/2008; attempted T ablation 2/10- aborted due to inappropriate substrate   Past Surgical History:  Procedure Laterality Date  . CARDIAC CATHETERIZATION  05/31/08   EF 55-60%, stable lesions, medical therapy  . COLECTOMY  '80's   polyps  . CORONARY ANGIOPLASTY WITH STENT PLACEMENT  01/19/08   PCI stent to mid LAD with Promus DES 27.5x28  . IR GENERIC HISTORICAL  01/03/2016   IR THORACENTESIS ASP PLEURAL SPACE W/IMG GUIDE 01/03/2016 [facility]-INTERV RAD  . PTCA  01/2008   Allergies  Allergen Reactions  . Contrast Media [Iodinated Diagnostic Agents] Swelling    Possible neck swelling after contrast for chest CT 03/2016. No hives, throat or other symptoms   Prior to Admission medications   Medication Sig Start Date End Date Taking? Authorizing Provider  atorvastatin (LIPITOR) 10 MG tablet Take 1 tablet (10 mg total) by mouth daily. 06/13/16   Robert Agreste, MD  Blood Glucose Monitoring Suppl (ACCU-CHEK AVIVA PLUS) w/Device KIT Test blood sugar 3 times daily. Dx code: E11.22 04/03/16   Robert Agreste, MD  busPIRone (BUSPAR) 5 MG tablet Take 0.5-2 tablets (2.5-10 mg total) by mouth 3 (three) times daily. 07/24/16   Tereasa Coop, PA-C  ferrous sulfate 325 (65 FE) MG tablet Take 325 mg by mouth daily with breakfast.    [provider]  GAVILYTE-G 236 g solution  05/24/16   [provider]  glucose blood (ACCU-CHEK AVIVA PLUS) test strip Test blood sugar 3 times daily. Dx code: E11.22 04/03/16   Robert Agreste, MD  HYDROcodone-acetaminophen (NORCO/VICODIN) 5-325 MG tablet Take 1-2 tablets by mouth every 6 (six) hours as needed for moderate pain. 10/11/16   Gale Journey, Damaris Hippo, PA-C  insulin NPH-regular Human (NOVOLIN 70/30) (70-30) 100 UNIT/ML injection Inject 7 Units into the skin 2 (two) times daily with a  meal. 06/13/16   Robert Agreste, MD  Insulin Syringe-Needle U-100 (B-D INS SYR HALF-UNIT .3CC/31G) 31G X 5/16" 0.3 ML MISC Use daily at bedtime to inject insulin. Dx code: 250.00 06/21/13   Robert Agreste, MD  Lancets (ACCU-CHEK MULTICLIX) lancets Test blood sugar 3 times daily. Dx code: E11.22 04/03/16   Robert Agreste, MD  lisinopril (PRINIVIL,ZESTRIL) 20 MG tablet TAKE 1 TABLET EVERY DAY 08/13/16   Robert Agreste, MD  lisinopril-hydrochlorothiazide (PRINZIDE,ZESTORETIC) 20-25 MG tablet Take 1 tablet by mouth daily. 06/13/16   Robert Agreste, MD  metFORMIN (GLUCOPHAGE) 1000 MG tablet take 2 (two) times per day:  one (1039m)  tab with Breakfast and one (1048m) tab with dinner. 06/21/16   GWendie Agreste MD  metoprolol (LOPRESSOR) 50 MG tablet Take 1 tablet (50 mg total) by mouth 2 (two) times daily. 06/13/16   GWendie Agreste MD  Multiple Vitamin (MULTIVITAMIN) tablet Take 1 tablet by mouth daily.    [provider]  NOVOLIN 70/30 (70-30) 100 UNIT/ML injection INJECT 6 UNITS UNDER THE SKIN TWICE DAILY 07/15/16   GWendie Agreste MD  Omega-3 Fatty Acids (FISH OIL) 1000 MG CAPS Take 2 capsules by mouth daily.     [provider]  sildenafil (VIAGRA) 50 MG tablet Take 1 tablet (50 mg total) by mouth daily as needed for erectile dysfunction. 02/08/15   BLorretta Harp MD  Vitamin D, Cholecalciferol, 1000 UNITS TABS Take 1 tablet by mouth daily.     [provider]  VOLTAREN 1 % GEL  06/08/16   [provider]   Social History   Social History  . Marital status: Legally Separated    Spouse name: [name]  . Number of children: 7  . Years of education: [name]   Occupational History  . RETIRED Retired   Social History Main Topics  . Smoking status: Former Smoker    Packs/day: 0.50    Years: 15.00    Types: Cigarettes    Quit date: 09/01/1990  . Smokeless tobacco: Never Used     Comment: quit 12/07/2013  . Alcohol use No  . Drug use: No  . Sexual  activity: No   Other Topics Concern  . Not on file   Social History Narrative   29 grandchildren. Single. Education: 12 th grade. Exercise: 3-4 times a week for 30 minutes working out and set ups.    Review of Systems  Constitutional: Negative for chills and fever.  Eyes: Negative for pain and redness.  Respiratory: Negative for choking.   Gastrointestinal: Negative for nausea and vomiting.  Musculoskeletal:       Rib pain  Neurological: Negative for syncope and speech difficulty.       Objective:   Physical Exam  Constitutional: He is oriented to person, place, and time. He appears well-developed and well-nourished. No distress.  HENT:  Head: Normocephalic and atraumatic.  Cardiovascular:  Regular rhythm with rare ectopic beat.   Pulmonary/Chest: Effort normal and breath sounds normal. No respiratory distress.  Lungs are clear, breath sounds are equal both upper and lower fields.  Abdominal: Soft. He exhibits no distension. There is no tenderness.  Musculoskeletal:  Tenderness on the lower rib margin on the left.   Neurological: He is alert and oriented to person, place, and time.  Psychiatric: He has a normal mood and affect. His behavior is normal.   Vitals:   10/17/16 0846  BP: 126/71  Pulse: 97  Resp: 16  Temp: 98 F (36.7 C)  TempSrc: Oral  SpO2: 95%  Weight: 199 lb 12.8 oz (90.6 kg)  Height: _0  (1.753 m)       Assessment & Plan:  JTREYVIN GLIDDENis a 83y.o. male Type 2 diabetes mellitus without complication, with long-term current use of insulin ([place] - Plan: Hemoglobin A1c  - Continue to watch diet, check A1c, continue same dose of insulin and metformin for now, but if persistent elevation of A1c, may need to increase insulin slightly.  Closed fracture of multiple ribs of left side with delayed healing, subsequent encounter - Plan: Basic metabolic panel, Care order/instruction:  -Persistent pain, but  slow improvement. Nonhealing fractures when last  imaged. Plan on recheck in September and at that time will be one year since injury. Suspect he may have some fracture nonunion, but expect pain to continue to improve. If persistent need for hydrocodone at that time, would recommend orthopedic evaluation for possible physical therapy. RTC precautions if worsening sooner.  No orders of the defined types were placed in this encounter.  Patient Instructions   Continue tylenol first if possible for rib fracture, then hydrocodone if needed for rib pain. We may want to check xrays again in 2 months. Continue to avoid pressure on that side. Physical therapy or repeat specialist evaluation may be an option if not continuing to improve   For diabetes, I will check her hemoglobin A1c and electrolytes. If that level is still elevated, we may need to slightly increase your insulin dose. No changes for now.  Return to the clinic or go to the nearest emergency room if any of your symptoms worsen or new symptoms occur.   IF you received an x-ray today, you will receive an invoice from [facility] Radiology. Please contact [facility]iology at [phone] with questions or concerns regarding your invoice.   IF you received labwork today, you will receive an invoice from [place]. Please contact [facility] at [phone] with questions or concerns regarding your invoice.   Our billing staff will not be able to assist you with questions regarding bills from these companies.  You will be contacted with the lab results as soon as they are available. The fastest way to get your results is to activate your My Chart account. Instructions are located on the last page of this paperwork. If you have not heard from [place] regarding the results in 2 weeks, please contact this office.       I personally performed the services described in this documentation, which was scribed in my presence. The recorded information has been reviewed and considered for accuracy and  completeness, addended by me as needed, and agree with information above.  Signed,   [name], MD Primary Care at [place].  10/18/16 2:04 PM

## 2016-10-17 NOTE — Patient Instructions (Addendum)
Continue tylenol first if possible for rib fracture, then hydrocodone if needed for rib pain. We may want to check xrays again in 2 months. Continue to avoid pressure on that side. Physical therapy or repeat specialist evaluation may be an option if not continuing to improve   For diabetes, I will check her hemoglobin A1c and electrolytes. If that level is still elevated, we may need to slightly increase your insulin dose. No changes for now.  Return to the clinic or go to the nearest emergency room if any of your symptoms worsen or new symptoms occur.   IF you received an x-ray today, you will receive an invoice from [facility]iology. Please contact [facility]iology at [phone] with questions or concerns regarding your invoice.   IF you received labwork today, you will receive an invoice from [place]. Please contact [facility] at [phone] with questions or concerns regarding your invoice.   Our billing staff will not be able to assist you with questions regarding bills from these companies.  You will be contacted with the lab results as soon as they are available. The fastest way to get your results is to activate your My Chart account. Instructions are located on the last page of this paperwork. If you have not heard from [place] regarding the results in 2 weeks, please contact this office.

## 2016-10-18 LAB — BASIC METABOLIC PANEL
BUN/Creatinine Ratio: 14 (ref 10–24)
BUN: 19 mg/dL (ref 8–27)
CO2: 21 mmol/L (ref 20–29)
Calcium: 9.2 mg/dL (ref 8.6–10.2)
Chloride: 104 mmol/L (ref 96–106)
Creatinine, Ser: 1.36 mg/dL — ABNORMAL HIGH (ref 0.76–1.27)
GFR calc Af Amer: 56 mL/min/{1.73_m2} — ABNORMAL LOW (ref >59)
GFR calc non Af Amer: 49 mL/min/{1.73_m2} — ABNORMAL LOW (ref >59)
Glucose: 141 mg/dL — ABNORMAL HIGH (ref 65–99)
Potassium: 4.3 mmol/L (ref 3.5–5.2)
Sodium: 141 mmol/L (ref 134–144)

## 2016-10-18 LAB — HEMOGLOBIN A1C
Est. average glucose Bld gHb Est-mCnc: 186 mg/dL
Hgb A1c MFr Bld: 8.1 % — ABNORMAL HIGH (ref 4.8–5.6)

## 2016-11-04 DIAGNOSIS — M170 Bilateral primary osteoarthritis of knee: Secondary | ICD-10-CM | POA: Diagnosis not present

## 2016-11-12 ENCOUNTER — Telehealth: Payer: Self-pay | Admitting: Family Medicine

## 2016-11-12 DIAGNOSIS — R0789 Other chest pain: Secondary | ICD-10-CM

## 2016-11-12 DIAGNOSIS — S2242XD Multiple fractures of ribs, left side, subsequent encounter for fracture with routine healing: Secondary | ICD-10-CM

## 2016-11-12 MED ORDER — HYDROCODONE-ACETAMINOPHEN 5-325 MG PO TABS: 1.0000 | ORAL_TABLET | Freq: Four times a day (QID) | ORAL | 0 refills | Status: DC | PRN

## 2016-11-12 NOTE — Telephone Encounter (Signed)
Has a follow up on 9-13

## 2016-11-12 NOTE — Telephone Encounter (Signed)
Called and spoke with patient. States he will pick up prescription tomorrow.

## 2016-11-12 NOTE — Telephone Encounter (Signed)
Ordered. Ready for pickup.

## 2016-11-12 NOTE — Telephone Encounter (Signed)
PATIENT WOULD LIKE DR. GREENE TO KNOW THAT HE NEEDS SOME PAIN MEDICATION FOR HIS (L) RIB FRACTURE. BEST PHONE [phone] (CELL) PHARMACY CHOICE IS [facility] ON [address]. [place]

## 2016-11-13 ENCOUNTER — Ambulatory Visit (INDEPENDENT_AMBULATORY_CARE_PROVIDER_SITE_OTHER): Payer: Medicare HMO | Admitting: Emergency Medicine

## 2016-11-13 ENCOUNTER — Encounter: Payer: Self-pay | Admitting: Emergency Medicine

## 2016-11-13 VITALS — BP 180/78 | HR 68 | Temp 98.1°F | Resp 16 | Ht 69.0 in | Wt 223.8 lb

## 2016-11-13 DIAGNOSIS — R002 Palpitations: Secondary | ICD-10-CM

## 2016-11-13 DIAGNOSIS — Z794 Long term (current) use of insulin: Secondary | ICD-10-CM | POA: Diagnosis not present

## 2016-11-13 DIAGNOSIS — E119 Type 2 diabetes mellitus without complications: Secondary | ICD-10-CM

## 2016-11-13 DIAGNOSIS — I10 Essential (primary) hypertension: Secondary | ICD-10-CM | POA: Diagnosis not present

## 2016-11-13 NOTE — Patient Instructions (Addendum)
     IF you received an x-ray today, you will receive an invoice from [facility] Radiology. Please contact [facility] Radiology at [phone] with questions or concerns regarding your invoice.   IF you received labwork today, you will receive an invoice from [place]. Please contact [facility] at [phone] with questions or concerns regarding your invoice.   Our billing staff will not be able to assist you with questions regarding bills from these companies.  You will be contacted with the lab results as soon as they are available. The fastest way to get your results is to activate your My Chart account. Instructions are located on the last page of this paperwork. If you have not heard from [place] regarding the results in 2 weeks, please contact this office.     Palpitations A palpitation is the feeling that your heart:  Has an uneven (irregular) heartbeat.  Is beating faster than normal.  Is fluttering.  Is skipping a beat.  This is usually not a serious problem. In some cases, you may need more medical tests. Follow these instructions at home:  Avoid: ? Caffeine in coffee, tea, soft drinks, diet pills, and energy drinks. ? Chocolate. ? Alcohol.  Do not use any tobacco products. These include cigarettes, chewing tobacco, and e-cigarettes. If you need help quitting, ask your doctor.  Try to reduce your stress. These things may help: ? Yoga. ? Meditation. ? Physical activity. Swimming, jogging, and walking are good choices. ? A method that helps you use your mind to control things in your body, like heartbeats (biofeedback).  Get plenty of rest and sleep.  Take over-the-counter and prescription medicines only as told by your doctor.  Keep all follow-up visits as told by your doctor. This is important. Contact a doctor if:  Your heartbeat is still fast or uneven after 24 hours.  Your palpitations occur more often. Get help right away if:  You have chest pain.  You  feel short of breath.  You have a very bad headache.  You feel dizzy.  You pass out (faint). This information is not intended to replace advice given to you by your health care provider. Make sure you discuss any questions you have with your health care provider. Document Released: 01/02/2008 Document Revised: 08/31/2015 Document Reviewed: 12/08/2014 Elsevier Interactive Patient Education  [employer].

## 2016-11-13 NOTE — Progress Notes (Signed)
[name] 83 y.o.   Chief Complaint  Patient presents with  . Atrial Flutter    per patient happened 10;30 pm -sitting    HISTORY OF PRESENT ILLNESS: This is a 83 y.o. male was sitting down last night watching TV and for a few seconds felt heart fluttering with a hot flush to the face; no chest pain/pressure, SOB, N/V, syncope, fever, or any other significant symptoms. Diabetic, compliant with meds, non-smoker or drinker, no flu-like symptoms; feels fine now. Asymptomatic.   BP Readings from Last 3 Encounters:  11/13/16 (!) 180/78  10/17/16 126/71  09/03/16 (!) 146/82     HPI  Prior to Admission medications   Medication Sig Start Date End Date Taking? Authorizing Provider  atorvastatin (LIPITOR) 10 MG tablet Take 1 tablet (10 mg total) by mouth daily. 06/13/16  Yes [name], MD  busPIRone (BUSPAR) 5 MG tablet Take 0.5-2 tablets (2.5-10 mg total) by mouth 3 (three) times daily. 07/24/16  Yes Tereasa Coop, PA-C  glucose blood (ACCU-CHEK AVIVA PLUS) test strip Test blood sugar 3 times daily. Dx code: E11.22 04/03/16  Yes [name], MD  HYDROcodone-acetaminophen (NORCO/VICODIN) 5-325 MG tablet Take 1-2 tablets by mouth every 6 (six) hours as needed for moderate pain. 11/12/16  Yes [name], MD  insulin NPH-regular Human (NOVOLIN 70/30) (70-30) 100 UNIT/ML injection Inject 7 Units into the skin 2 (two) times daily with a meal. 06/13/16  Yes [name], MD  lisinopril (PRINIVIL,ZESTRIL) 20 MG tablet TAKE 1 TABLET EVERY DAY 08/13/16  Yes [name], MD  lisinopril-hydrochlorothiazide (PRINZIDE,ZESTORETIC) 20-25 MG tablet Take 1 tablet by mouth daily. 06/13/16  Yes [name], MD  metFORMIN (GLUCOPHAGE) 1000 MG tablet take 2 (two) times per day:  one (1084m) tab with Breakfast and one (10020m tab with dinner. 06/21/16  Yes GrWendie AgresteMD  metoprolol (LOPRESSOR) 50 MG tablet Take 1 tablet (50 mg total) by mouth 2 (two) times daily. 06/13/16   Yes GrWendie AgresteMD  Multiple Vitamin (MULTIVITAMIN) tablet Take 1 tablet by mouth daily.   Yes [provider]  NOVOLIN 70/30 (70-30) 100 UNIT/ML injection INJECT 6 UNITS UNDER THE SKIN TWICE DAILY 07/15/16  Yes GrWendie AgresteMD  Omega-3 Fatty Acids (FISH OIL) 1000 MG CAPS Take 2 capsules by mouth daily.    Yes [provider]  Vitamin D, Cholecalciferol, 1000 UNITS TABS Take 1 tablet by mouth daily.    Yes [provider]  Blood Glucose Monitoring Suppl (ACCU-CHEK AVIVA PLUS) w/Device KIT Test blood sugar 3 times daily. Dx code: E11.22 04/03/16   GrWendie AgresteMD  ferrous sulfate 325 (65 FE) MG tablet Take 325 mg by mouth daily with breakfast.    [provider]  GAVILYTE-G 236 g solution  05/24/16   [provider]  Insulin Syringe-Needle U-100 (B-D INS SYR HALF-UNIT .3CC/31G) 31G X 5/16" 0.3 ML MISC Use daily at bedtime to inject insulin. Dx code: 250.00 06/21/13   GrWendie AgresteMD  Lancets (ACCU-CHEK MULTICLIX) lancets Test blood sugar 3 times daily. Dx code: E11.22 04/03/16   GrWendie AgresteMD  sildenafil (VIAGRA) 50 MG tablet Take 1 tablet (50 mg total) by mouth daily as needed for erectile dysfunction. Patient not taking: Reported on 11/13/2016 02/08/15   BeLorretta HarpMD  VOLTAREN 1 % GEL  06/08/16   [provider]    Allergies  Allergen Reactions  . Contrast Media [Iodinated  Diagnostic Agents] Swelling    Possible neck swelling after contrast for chest CT 03/2016. No hives, throat or other symptoms    Patient Active Problem List   Diagnosis Date Noted  . Rib pain on left side 07/01/2016  . Fall 12/14/2015  . Elevated serum creatinine 12/14/2015  . Ileus ([place]) 12/14/2015  . Multiple fractures of ribs of left side 12/12/2015  . Blood in the urine 12/01/2013  . Cystitis, radiation 12/01/2013  . Malignant neoplasm of prostate ([place]) 11/26/2013  . Pulsatile tinnitus 11/19/2013  . Rectal bleeding 12/23/2012   . Type II or unspecified type diabetes mellitus without mention of complication, not stated as uncontrolled   . Essential hypertension, benign   . Chronic back pain   . ED (erectile dysfunction)   . Anemia   . Hyperlipidemia   . CAD, NATIVE VESSEL 05/08/2008  . VENTRICULAR TACHYCARDIA 05/08/2008  . PREMATURE VENTRICULAR CONTRACTIONS 05/08/2008    Past Medical History:  Diagnosis Date  . Allergy   . CAD (coronary artery disease)    pci to LAD 10/09; stable CAD by cath 05/31/08; myoview 04/11/11- no ishcemia, EF 67%; echo 05/15/09- EF>55%, mod calcification of the aortic valve leaflets  . Chronic back pain   . ED (erectile dysfunction)   . Essential hypertension, benign   . Hyperlipidemia   . Multiple rib fractures    left 9th, 10th, and 11th posterior rib fractures S/P fall 12/09/2015/notes 12/12/2015  . Prostate cancer ([place])    s/p  . Type II or unspecified type diabetes mellitus without mention of complication, not stated as uncontrolled   . Ventricular tachycardia ([place])    resuscitated; monitor 05/2008; attempted T ablation 2/10- aborted due to inappropriate substrate    Past Surgical History:  Procedure Laterality Date  . CARDIAC CATHETERIZATION  05/31/08   EF 55-60%, stable lesions, medical therapy  . COLECTOMY  '80's   polyps  . CORONARY ANGIOPLASTY WITH STENT PLACEMENT  01/19/08   PCI stent to mid LAD with Promus DES 27.5x28  . IR GENERIC HISTORICAL  01/03/2016   IR THORACENTESIS ASP PLEURAL SPACE W/IMG GUIDE 01/03/2016 [facility]  . PTCA  01/2008    Social History   Social History  . Marital status: Legally Separated    Spouse name: [name]  . Number of children: 7  . Years of education: [name]   Occupational History  . RETIRED Retired   Social History Main Topics  . Smoking status: Former Smoker    Packs/day: 0.50    Years: 15.00    Types: Cigarettes    Quit date: 09/01/1990  . Smokeless tobacco: Never Used     Comment: quit 12/07/2013  . Alcohol use No  . Drug  use: No  . Sexual activity: No   Other Topics Concern  . Not on file   Social History Narrative   29 grandchildren. Single. Education: 12 th grade. Exercise: 3-4 times a week for 30 minutes working out and set ups.    Family History  Problem Relation Age of Onset  . Diabetes Mother   . Heart attack Mother   . Leukemia Father   . Heart disease Sister   . Heart disease Brother   . Diabetes Brother        pancreatic cancer     Review of Systems  Constitutional: Negative.  Negative for chills and fever.  HENT: Negative.  Negative for ear pain, nosebleeds, sinus pain and sore throat.   Eyes: Negative.  Negative for blurred  vision and double vision.  Respiratory: Negative.  Negative for cough and shortness of breath.   Cardiovascular: Positive for palpitations. Negative for chest pain and leg swelling.  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 Negative for abdominal pain, blood in stool, melena, nausea and vomiting.  Genitourinary: Negative.  Negative for hematuria.  Musculoskeletal: Negative for back pain and myalgias.  Skin: Negative.  Negative for rash.  Neurological: Negative for dizziness, sensory change, speech change, focal weakness, loss of consciousness and headaches.  Endo/Heme/Allergies: Negative.   All other systems reviewed and are negative.  Vitals:   11/13/16 0812  BP: (!) 180/78  Pulse: 68  Resp: 16  Temp: 98.1 F (36.7 C)     Physical Exam  Constitutional: He is oriented to person, place, and time. He appears well-developed and well-nourished.  HENT:  Head: Normocephalic and atraumatic.  Nose: Nose normal.  Mouth/Throat: Oropharynx is clear and moist.  Eyes: Pupils are equal, round, and reactive to light. Conjunctivae and EOM are normal.  Neck: Normal range of motion. Neck supple. No JVD present. No thyromegaly present.  Cardiovascular: Normal rate, regular rhythm, normal heart sounds and intact distal pulses.     ocassional premature beats.  Pulmonary/Chest: Effort normal and breath sounds normal.  Abdominal: Soft. Bowel sounds are normal. He exhibits no distension. There is no tenderness.  Musculoskeletal: Normal range of motion. He exhibits no edema.  Lymphadenopathy:    He has no cervical adenopathy.  Neurological: He is alert and oriented to person, place, and time. No sensory deficit. He exhibits normal muscle tone.  Skin: Skin is warm and dry. Capillary refill takes less than 2 seconds. No rash noted.  Psychiatric: He has a normal mood and affect. His behavior is normal.  Vitals reviewed.  EKG: NSR with occasional PAC's; no ischemic changes.   ASSESSMENT & PLAN: [name] was seen today for atrial flutter.  Diagnoses and all orders for this visit:  Palpitations -     Basic metabolic panel -     CBC with Differential/Platelet -     EKG 12-Lead  Type 2 diabetes mellitus without complication, with long-term current use of insulin ([place])  Essential hypertension    Patient Instructions       IF you received an x-ray today, you will receive an invoice from [facility]. Please contact [facility] at [phone] with questions or concerns regarding your invoice.   IF you received labwork today, you will receive an invoice from [facility]. Please contact [facility] at [phone] with questions or concerns regarding your invoice.   Our billing staff will not be able to assist you with questions regarding bills from these companies.  You will be contacted with the lab results as soon as they are available. The fastest way to get your results is to activate your My Chart account. Instructions are located on the last page of this paperwork. If you have not heard from [place] regarding the results in 2 weeks, please contact this office.     Palpitations A palpitation is the feeling that your heart:  Has an uneven (irregular) heartbeat.  Is beating faster than normal.  Is  fluttering.  Is skipping a beat.  This is usually not a serious problem. In some cases, you may need more medical tests. Follow these instructions at home:  Avoid: ? Caffeine in coffee, tea, soft drinks, diet pills, and energy drinks. ? Chocolate. ? Alcohol.  Do not use any tobacco products. These include cigarettes, chewing  tobacco, and e-cigarettes. If you need help quitting, ask your doctor.  Try to reduce your stress. These things may help: ? Yoga. ? Meditation. ? Physical activity. Swimming, jogging, and walking are good choices. ? A method that helps you use your mind to control things in your body, like heartbeats (biofeedback).  Get plenty of rest and sleep.  Take over-the-counter and prescription medicines only as told by your doctor.  Keep all follow-up visits as told by your doctor. This is important. Contact a doctor if:  Your heartbeat is still fast or uneven after 24 hours.  Your palpitations occur more often. Get help right away if:  You have chest pain.  You feel short of breath.  You have a very bad headache.  You feel dizzy.  You pass out (faint). This information is not intended to replace advice given to you by your health care provider. Make sure you discuss any questions you have with your health care provider. Document Released: 01/02/2008 Document Revised: 08/31/2015 Document Reviewed: 12/08/2014 Elsevier Interactive Patient Education  2018 Elsevier Inc.      Agustina Caroli, MD [facility]

## 2016-11-14 ENCOUNTER — Encounter: Payer: Self-pay | Admitting: Radiology

## 2016-11-14 LAB — CBC WITH DIFFERENTIAL/PLATELET
Basophils Absolute: 0 10*3/uL (ref 0.0–0.2)
Basos: 0 %
EOS (ABSOLUTE): 0.2 10*3/uL (ref 0.0–0.4)
Eos: 2 %
Hematocrit: 38.2 % (ref 37.5–51.0)
Hemoglobin: 12.1 g/dL — ABNORMAL LOW (ref 13.0–17.7)
Immature Grans (Abs): 0 10*3/uL (ref 0.0–0.1)
Immature Granulocytes: 0 %
Lymphocytes Absolute: 1.3 10*3/uL (ref 0.7–3.1)
Lymphs: 17 %
MCH: 26.5 pg — ABNORMAL LOW (ref 26.6–33.0)
MCHC: 31.7 g/dL (ref 31.5–35.7)
MCV: 84 fL (ref 79–97)
Monocytes Absolute: 0.7 10*3/uL (ref 0.1–0.9)
Monocytes: 10 %
Neutrophils Absolute: 5.3 10*3/uL (ref 1.4–7.0)
Neutrophils: 71 %
Platelets: 191 10*3/uL (ref 150–379)
RBC: 4.56 x10E6/uL (ref 4.14–5.80)
RDW: 15.8 % — ABNORMAL HIGH (ref 12.3–15.4)
WBC: 7.5 10*3/uL (ref 3.4–10.8)

## 2016-11-14 LAB — BASIC METABOLIC PANEL
BUN/Creatinine Ratio: 11 (ref 10–24)
BUN: 15 mg/dL (ref 8–27)
CO2: 24 mmol/L (ref 20–29)
Calcium: 9.2 mg/dL (ref 8.6–10.2)
Chloride: 103 mmol/L (ref 96–106)
Creatinine, Ser: 1.42 mg/dL — ABNORMAL HIGH (ref 0.76–1.27)
GFR calc Af Amer: 54 mL/min/{1.73_m2} — ABNORMAL LOW (ref >59)
GFR calc non Af Amer: 46 mL/min/{1.73_m2} — ABNORMAL LOW (ref >59)
Glucose: 102 mg/dL — ABNORMAL HIGH (ref 65–99)
Potassium: 4.7 mmol/L (ref 3.5–5.2)
Sodium: 139 mmol/L (ref 134–144)

## 2016-11-27 ENCOUNTER — Other Ambulatory Visit: Payer: Self-pay | Admitting: Family Medicine

## 2016-12-11 ENCOUNTER — Telehealth: Payer: Self-pay | Admitting: Family Medicine

## 2016-12-11 DIAGNOSIS — R0789 Other chest pain: Secondary | ICD-10-CM

## 2016-12-11 DIAGNOSIS — S2242XD Multiple fractures of ribs, left side, subsequent encounter for fracture with routine healing: Secondary | ICD-10-CM

## 2016-12-11 NOTE — Telephone Encounter (Signed)
Pt is needing to get a refill on his pain medication    Best number [phone]

## 2016-12-13 MED ORDER — HYDROCODONE-ACETAMINOPHEN 5-325 MG PO TABS: 1.0000 | ORAL_TABLET | Freq: Four times a day (QID) | ORAL | 0 refills | Status: DC | PRN

## 2016-12-13 NOTE — Telephone Encounter (Signed)
Hydrocodone refilled, but keep office visit on the 13th for possible repeat x-rays

## 2016-12-17 ENCOUNTER — Other Ambulatory Visit: Payer: Self-pay | Admitting: Family Medicine

## 2016-12-17 DIAGNOSIS — E785 Hyperlipidemia, unspecified: Secondary | ICD-10-CM

## 2016-12-17 DIAGNOSIS — E119 Type 2 diabetes mellitus without complications: Secondary | ICD-10-CM

## 2016-12-17 DIAGNOSIS — Z794 Long term (current) use of insulin: Secondary | ICD-10-CM

## 2016-12-17 DIAGNOSIS — I10 Essential (primary) hypertension: Secondary | ICD-10-CM

## 2016-12-19 ENCOUNTER — Ambulatory Visit (INDEPENDENT_AMBULATORY_CARE_PROVIDER_SITE_OTHER): Payer: Medicare HMO

## 2016-12-19 ENCOUNTER — Ambulatory Visit (INDEPENDENT_AMBULATORY_CARE_PROVIDER_SITE_OTHER): Payer: Medicare HMO | Admitting: Family Medicine

## 2016-12-19 ENCOUNTER — Encounter: Payer: Self-pay | Admitting: Family Medicine

## 2016-12-19 VITALS — BP 162/80 | HR 73 | Temp 98.3°F | Resp 16 | Wt 224.0 lb

## 2016-12-19 VITALS — BP 171/79 | HR 73 | Temp 97.9°F | Ht 69.0 in | Wt 224.5 lb

## 2016-12-19 DIAGNOSIS — Z0000 Encounter for general adult medical examination without abnormal findings: Secondary | ICD-10-CM

## 2016-12-19 DIAGNOSIS — S2242XA Multiple fractures of ribs, left side, initial encounter for closed fracture: Secondary | ICD-10-CM

## 2016-12-19 DIAGNOSIS — M545 Low back pain, unspecified: Secondary | ICD-10-CM

## 2016-12-19 NOTE — Progress Notes (Signed)
P  Subjective:   [name] is a 83 y.o. male who presents for an Initial Medicare Annual Wellness Visit.  Review of Systems  [name] Cardiac Risk Factors include: advanced age (>63mn, >>73women);diabetes mellitus;dyslipidemia;hypertension;male gender;smoking/ tobacco exposure;obesity (BMI >30kg/m2)    Objective:    Today's Vitals   12/19/16 0823 12/19/16 0830  BP: (!) 188/80 (!) 171/79  Pulse: 73   Temp: 97.9 F (36.6 C)   TempSrc: Oral   Weight: 224 lb 8 oz (101.8 kg)   Height: 5' 9"  (1.753 m)   PainSc:  8    Body mass index is 33.15 kg/m.  Current Medications (verified) Outpatient Encounter Prescriptions as of 12/19/2016  Medication Sig  . atorvastatin (LIPITOR) 10 MG tablet TAKE 1 TABLET (10 MG TOTAL) BY MOUTH DAILY.  .Marland KitchenBlood Glucose Monitoring Suppl (ACCU-CHEK AVIVA PLUS) w/Device KIT Test blood sugar 3 times daily. Dx code: E11.22  . busPIRone (BUSPAR) 5 MG tablet Take 0.5-2 tablets (2.5-10 mg total) by mouth 3 (three) times daily.  . ferrous sulfate 325 (65 FE) MG tablet Take 325 mg by mouth daily with breakfast.  . GAVILYTE-G 236 g solution   . glucose blood (ACCU-CHEK AVIVA PLUS) test strip Test blood sugar 3 times daily. Dx code: E11.22  . HYDROcodone-acetaminophen (NORCO/VICODIN) 5-325 MG tablet Take 1-2 tablets by mouth every 6 (six) hours as needed for moderate pain.  .Marland Kitcheninsulin NPH-regular Human (NOVOLIN 70/30) (70-30) 100 UNIT/ML injection Inject 7 Units into the skin 2 (two) times daily with a meal.  . Insulin Syringe-Needle U-100 (B-D INS SYR HALF-UNIT .3CC/31G) 31G X 5/16" 0.3 ML MISC Use daily at bedtime to inject insulin. Dx code: 250.00  . Lancets (ACCU-CHEK MULTICLIX) lancets Test blood sugar 3 times daily. Dx code: E11.22  . lisinopril (PRINIVIL,ZESTRIL) 20 MG tablet TAKE 1 TABLET EVERY DAY  . lisinopril-hydrochlorothiazide (PRINZIDE,ZESTORETIC) 20-25 MG tablet TAKE 1 TABLET EVERY DAY  . metFORMIN (GLUCOPHAGE) 1000 MG tablet TAKE 1 TABLET TWICE DAILY  WITH MEALS  . metoprolol tartrate (LOPRESSOR) 50 MG tablet TAKE 1 TABLET TWICE DAILY  . Multiple Vitamin (MULTIVITAMIN) tablet Take 1 tablet by mouth daily.  .Marland KitchenNOVOLIN 70/30 (70-30) 100 UNIT/ML injection INJECT 6 UNITS UNDER THE SKIN TWICE DAILY  . Omega-3 Fatty Acids (FISH OIL) 1000 MG CAPS Take 2 capsules by mouth daily.   . sildenafil (VIAGRA) 50 MG tablet Take 1 tablet (50 mg total) by mouth daily as needed for erectile dysfunction.  . Vitamin D, Cholecalciferol, 1000 UNITS TABS Take 1 tablet by mouth daily.   . VOLTAREN 1 % GEL    No facility-administered encounter medications on file as of 12/19/2016.     Allergies (verified) Contrast media [iodinated diagnostic agents]   History: Past Medical History:  Diagnosis Date  . Allergy   . CAD (coronary artery disease)    pci to LAD 10/09; stable CAD by cath 05/31/08; myoview 04/11/11- no ishcemia, EF 67%; echo 05/15/09- EF>55%, mod calcification of the aortic valve leaflets  . Chronic back pain   . ED (erectile dysfunction)   . Essential hypertension, benign   . Hyperlipidemia   . Multiple rib fractures    left 9th, 10th, and 11th posterior rib fractures S/P fall 12/09/2015/notes 12/12/2015  . Prostate cancer ([place]    s/p  . Type II or unspecified type diabetes mellitus without mention of complication, not stated as uncontrolled   . Ventricular tachycardia ([place]    resuscitated; monitor 05/2008; attempted T ablation 2/10- aborted due to  inappropriate substrate   Past Surgical History:  Procedure Laterality Date  . CARDIAC CATHETERIZATION  05/31/08   EF 55-60%, stable lesions, medical therapy  . COLECTOMY  '80's   polyps  . CORONARY ANGIOPLASTY WITH STENT PLACEMENT  01/19/08   PCI stent to mid LAD with Promus DES 27.5x28  . IR GENERIC HISTORICAL  01/03/2016   IR THORACENTESIS ASP PLEURAL SPACE W/IMG GUIDE 01/03/2016 [facility]  . PTCA  01/2008   Family History  Problem Relation Age of Onset  . Diabetes Mother   . Heart attack  Mother   . Leukemia Father   . Heart disease Sister   . Heart disease Brother   . Diabetes Brother        pancreatic cancer   Social History   Occupational History  . RETIRED Retired   Social History Main Topics  . Smoking status: Current Some Day Smoker    Packs/day: 0.00    Years: 15.00    Types: Cigarettes    Last attempt to quit: 09/01/1990  . Smokeless tobacco: Never Used     Comment: Patient smokes occasionally. Not everyday.  . Alcohol use No  . Drug use: No  . Sexual activity: No   Tobacco Counseling Ready to quit: No Counseling given: Not Answered   Activities of Daily Living In your present state of health, do you have any difficulty performing the following activities: 12/19/2016 05/02/2016  Hearing? N N  Comment Patient does has a slight ringing in his ear. -  Vision? N N  Difficulty concentrating or making decisions? N N  Walking or climbing stairs? N Y  Dressing or bathing? N N  Doing errands, shopping? N N  Preparing Food and eating ? N -  Using the Toilet? N -  In the past six months, have you accidently leaked urine? N -  Do you have problems with loss of bowel control? N -  Managing your Medications? N -  Managing your Finances? N -  Housekeeping or managing your Housekeeping? N -  Some recent data might be hidden    Immunizations and Health Maintenance Immunization History  Administered Date(s) Administered  . Influenza,inj,Quad PF,6+ Mos 01/04/2013, 12/27/2013, 01/02/2015, 12/30/2015  . Pneumococcal Conjugate-13 12/27/2013  . Pneumococcal Polysaccharide-23 01/04/2013  . Td 04/08/2009  . Zoster 04/08/2009   Health Maintenance Due  Topic Date Due  . OPHTHALMOLOGY EXAM  04/08/2016    Patient Care Team: [name], MD as PCP - General (Family Medicine) Lorretta Harp, MD as Consulting Physician (Cardiology)  Indicate any recent Medical Services you may have received from other than [facility] providers in the past year (date may be  approximate).    Assessment:   This is a routine wellness examination for [name].   Hearing/Vision screen Vision Screening Comments: Patient states that he sees an eye doctor at [facility] yearly.   Dietary issues and exercise activities discussed: Current Exercise Habits: Home exercise routine, Type of exercise: treadmill (biking), Time (Minutes): 30, Frequency (Times/Week): 3, Weekly Exercise (Minutes/Week): 90, Intensity: Moderate, Exercise limited by: None identified  Goals    . Reduce sugar intake to 36 grams daily          Patient wants to try to decrease his sugar intake daily.       Depression Screen PHQ 2/9 Scores 12/19/2016 11/13/2016 10/17/2016 09/03/2016  PHQ - 2 Score 0 0 0 0  Not completed - - - -    Fall Risk Fall Risk  12/19/2016  11/13/2016 10/17/2016 09/03/2016 06/13/2016  Falls in the past year? No Yes No No Yes  Number falls in past yr: - 1 - - 1  Comment - - - - -  Injury with Fall? - (No Data) - - Yes  Comment - fx ribs - - -  Risk Factor Category  - - - - -  Risk for fall due to : - - - - -  Follow up - - - - -    Cognitive Function:     6CIT Screen 12/19/2016  What Year? 0 points  What month? 0 points  What time? 0 points  Count back from 20 0 points  Months in reverse 4 points  Repeat phrase 10 points  Total Score 14    Screening Tests Health Maintenance  Topic Date Due  . OPHTHALMOLOGY EXAM  04/08/2016  . INFLUENZA VACCINE  03/20/2017 (Originally 11/06/2016)  . HEMOGLOBIN A1C  04/19/2017  . FOOT EXAM  05/02/2017  . TETANUS/TDAP  04/09/2019  . PNA vac Low Risk Adult  Completed        Plan:   I have personally reviewed and noted the following in the patient's chart:   . Medical and social history . Use of alcohol, tobacco or illicit drugs  . Current medications and supplements . Functional ability and status . Nutritional status . Physical activity . Advanced directives . List of other physicians . Hospitalizations, surgeries, and ER visits  in previous 12 months . Vitals . Screenings to include cognitive, depression, and falls . Referrals and appointments  In addition, I have reviewed and discussed with patient certain preventive protocols, quality metrics, and best practice recommendations. A written personalized care plan for preventive services as well as general preventive health recommendations were provided to patient.   Patient declined flu vaccine today. WiIl receive later in the season.   Andrez Grime, LPN   [id number]

## 2016-12-19 NOTE — Progress Notes (Signed)
Subjective:  This chart was scribed for [name], MD by [name], at [place] at [facility].  This patient was seen in room 11 and the patient's care was started at 10:15 AM.   Chief Complaint  Patient presents with  . Follow-up    rib fx     Patient ID: [name], male    DOB: 1933-07-31, 83 y.o.   MRN: [id number]  HPI HPI Comments: [name] is a 83 y.o. male who presents to Primary Care at [facility] for a follow up on rib fractures with chronic residual pain on the left side.  Initial injury September 2017.  Multiple fractures from 8th- 11 th on left side with subsequent pleural effusion that resolved.  Most recent image May 29 th- stable pleural thickening left lung base. Incomplete healing of 9th and 10 th ribs. He has seen orthopedics in March of this year who noted that it may take up to a year for pain to resolve. He has been using Hydrocodone- up to two per day. Last prescription for hydrocodone for #45 on September 7th, previously prescribed on August 7th. Lumbar spine x ray may 29- had multiple level DDD greatest L4-5 and L5 S1, no compression fractures.---- Patient is having (non radiating) lower back pain and states that it has worsened since hurting his ribs. He is compliant with taking hydrocodone daily- usually two pills but sometimes takes one pill  (2-3 times per week).  Denies loss of control of bladder or bowel movements or weakness/numbness in the legs.   Patient recently got new dentures.      Patient Active Problem List   Diagnosis Date Noted  . Rib pain on left side 07/01/2016  . Fall 12/14/2015  . Elevated serum creatinine 12/14/2015  . Ileus ([place]) 12/14/2015  . Multiple fractures of ribs of left side 12/12/2015  . Blood in the urine 12/01/2013  . Cystitis, radiation 12/01/2013  . Malignant neoplasm of prostate ([place]) 11/26/2013  . Pulsatile tinnitus 11/19/2013  . Rectal bleeding 12/23/2012  . Type II or unspecified type diabetes  mellitus without mention of complication, not stated as uncontrolled   . Essential hypertension, benign   . Chronic back pain   . ED (erectile dysfunction)   . Anemia   . Hyperlipidemia   . CAD, NATIVE VESSEL 05/08/2008  . VENTRICULAR TACHYCARDIA 05/08/2008  . PREMATURE VENTRICULAR CONTRACTIONS 05/08/2008   Past Medical History:  Diagnosis Date  . Allergy   . CAD (coronary artery disease)    pci to LAD 10/09; stable CAD by cath 05/31/08; myoview 04/11/11- no ishcemia, EF 67%; echo 05/15/09- EF>55%, mod calcification of the aortic valve leaflets  . Chronic back pain   . ED (erectile dysfunction)   . Essential hypertension, benign   . Hyperlipidemia   . Multiple rib fractures    left 9th, 10th, and 11th posterior rib fractures S/P fall 12/09/2015/notes 12/12/2015  . Prostate cancer ([place])    s/p  . Type II or unspecified type diabetes mellitus without mention of complication, not stated as uncontrolled   . Ventricular tachycardia ([place])    resuscitated; monitor 05/2008; attempted T ablation 2/10- aborted due to inappropriate substrate   Past Surgical History:  Procedure Laterality Date  . CARDIAC CATHETERIZATION  05/31/08   EF 55-60%, stable lesions, medical therapy  . COLECTOMY  '80's   polyps  . CORONARY ANGIOPLASTY WITH STENT PLACEMENT  01/19/08   PCI stent to mid LAD with Promus DES 27.5x28  .  IR GENERIC HISTORICAL  01/03/2016   IR THORACENTESIS ASP PLEURAL SPACE W/IMG GUIDE 01/03/2016 [facility]-INTERV RAD  . PTCA  01/2008   Allergies  Allergen Reactions  . Contrast Media [Iodinated Diagnostic Agents] Swelling    Possible neck swelling after contrast for chest CT 03/2016. No hives, throat or other symptoms   Prior to Admission medications   Medication Sig Start Date End Date Taking? Authorizing Provider  atorvastatin (LIPITOR) 10 MG tablet TAKE 1 TABLET (10 MG TOTAL) BY MOUTH DAILY. 12/19/16  Yes [name], MD  Blood Glucose Monitoring Suppl (ACCU-CHEK AVIVA PLUS) w/Device KIT Test  blood sugar 3 times daily. Dx code: E11.22 04/03/16  Yes [name], MD  busPIRone (BUSPAR) 5 MG tablet Take 0.5-2 tablets (2.5-10 mg total) by mouth 3 (three) times daily. 07/24/16  Yes Tereasa Coop, PA-C  ferrous sulfate 325 (65 FE) MG tablet Take 325 mg by mouth daily with breakfast.   Yes [provider]  GAVILYTE-G 236 g solution  05/24/16  Yes [provider]  glucose blood (ACCU-CHEK AVIVA PLUS) test strip Test blood sugar 3 times daily. Dx code: E11.22 04/03/16  Yes [name], MD  HYDROcodone-acetaminophen (NORCO/VICODIN) 5-325 MG tablet Take 1-2 tablets by mouth every 6 (six) hours as needed for moderate pain. 12/13/16  Yes [name], MD  insulin NPH-regular Human (NOVOLIN 70/30) (70-30) 100 UNIT/ML injection Inject 7 Units into the skin 2 (two) times daily with a meal. 06/13/16  Yes [name], MD  Insulin Syringe-Needle U-100 (B-D INS SYR HALF-UNIT .3CC/31G) 31G X 5/16" 0.3 ML MISC Use daily at bedtime to inject insulin. Dx code: 250.00 06/21/13  Yes [name], MD  Lancets (ACCU-CHEK MULTICLIX) lancets Test blood sugar 3 times daily. Dx code: E11.22 04/03/16  Yes [name], MD  lisinopril (PRINIVIL,ZESTRIL) 20 MG tablet TAKE 1 TABLET EVERY DAY 08/13/16  Yes [name], MD  lisinopril-hydrochlorothiazide (PRINZIDE,ZESTORETIC) 20-25 MG tablet TAKE 1 TABLET EVERY DAY 12/19/16  Yes [name], MD  metFORMIN (GLUCOPHAGE) 1000 MG tablet TAKE 1 TABLET TWICE DAILY WITH MEALS 12/19/16  Yes [name], MD  metoprolol tartrate (LOPRESSOR) 50 MG tablet TAKE 1 TABLET TWICE DAILY 12/19/16  Yes [name], MD  Multiple Vitamin (MULTIVITAMIN) tablet Take 1 tablet by mouth daily.   Yes [provider]  NOVOLIN 70/30 (70-30) 100 UNIT/ML injection INJECT 6 UNITS UNDER THE SKIN TWICE DAILY 11/27/16  Yes Weber, Sarah L, PA-C  Omega-3 Fatty Acids (FISH OIL) 1000 MG CAPS Take 2 capsules by mouth daily.    Yes [provider]  sildenafil (VIAGRA) 50 MG tablet Take 1 tablet (50 mg total) by mouth daily as needed for erectile dysfunction. 02/08/15  Yes Lorretta Harp, MD  Vitamin D, Cholecalciferol, 1000 UNITS TABS Take 1 tablet by mouth daily.    Yes [provider]  VOLTAREN 1 % GEL  06/08/16  Yes [provider]   Social History   Social History  . Marital status: Legally Separated    Spouse name: [name]  . Number of children: 7  . Years of education: [name]   Occupational History  . RETIRED Retired   Social History Main Topics  . Smoking status: Current Some Day Smoker    Packs/day: 0.00    Years: 15.00    Types: Cigarettes    Last attempt to quit: 09/01/1990  . Smokeless tobacco: Never Used     Comment: Patient smokes occasionally.  Not everyday.  . Alcohol use No  . Drug use: No  . Sexual activity: No   Other Topics Concern  . Not on file   Social History Narrative   29 grandchildren. Single. Education: 12 th grade. Exercise: 3-4 times a week for 30 minutes working out and set ups.      Review of Systems  Constitutional: Negative for chills and fever.  Eyes: Negative for pain and redness.  Respiratory: Negative for cough and choking.   Gastrointestinal: Negative for nausea and vomiting.  Genitourinary: Negative for difficulty urinating, dysuria and frequency.  Musculoskeletal: Positive for back pain.       Rib pain  Neurological: Negative for speech difficulty.       Objective:   Physical Exam  Constitutional: He is oriented to person, place, and time. He appears well-developed and well-nourished. No distress.  HENT:  Head: Normocephalic and atraumatic.  Pulmonary/Chest: Effort normal. No respiratory distress.  Musculoskeletal:  Tenderness to the lower lumbar spine, primarily to left of lower lumbar spine. Lumbar: Forward flexion about 60 degrees, extension is in tact. Pain on the left with lateral flexion. Rotation is intact and equal. Negative seated  straight leg raise.  Strength is intact bilaterally. Tender along the left lateral rib margin into the mid scapular line  Neurological: He is alert and oriented to person, place, and time.  Reflex Scores:      Patellar reflexes are 2+ on the right side and 2+ on the left side.      Achilles reflexes are 2+ on the right side and 2+ on the left side. Skin: Skin is warm and dry.   Vitals:   12/19/16 0933  BP: (!) 162/80  Pulse: 73  Resp: 16  Temp: 98.3 F (36.8 C)  TempSrc: Oral  SpO2: 99%  Weight: 224 lb (101.6 kg)   Dg Ribs Unilateral W/chest Left  Result Date: 12/19/2016 CLINICAL DATA:  History of left rib fractures with persistent pain, initial encounter EXAM: LEFT RIBS AND CHEST - 3+ VIEW COMPARISON:  09/03/2016 FINDINGS: Cardiac shadow is stable. Aortic calcifications are again seen and stable. The lungs are clear. Small left pleural effusion remains. Fractures of the left ninth and tenth ribs are again identified with minimal callus formation. Irregularity of the eighth rib is again identified which may represent an undisplaced fracture. Pleural thickening is again seen. No new focal abnormality is noted. No pneumothorax is seen. IMPRESSION: Stable appearing left ninth and tenth rib fractures with minimal callus formation. No new abnormality is seen. Irregularity of the eighth rib is identified which may be related to undisplaced fracture. Electronically Signed   By: Inez Catalina M.D.   On: 12/19/2016 11:15       Assessment & Plan:  [name] is a 83 y.o. male Closed fracture of multiple ribs of left side, initial encounter - Plan: DG Ribs Unilateral W/Chest Left  Left-sided low back pain without sciatica, unspecified chronicity - Plan: Ambulatory referral to Pain Clinic  Persistent left-sided pain from multiple rib fractures one year ago. Also with some flare of previous low back pain, and requiring hydrocodone daily, up to 2 per day.  - Previously has had evaluation with  orthopedics, but with persistent pain and rib and low back pain, may consider physical therapy versus other intervention. Asked him to follow back up with orthopedics to discuss options.  - Continue hydrocodone for now, but with persistent need will refer to pain management to look into continuing this  medication versus other treatment options.  No orders of the defined types were placed in this encounter.  Patient Instructions   Call Dr. Erlinda Hong for appointment to discuss your rib and back pain. I would like him help decide if physical therapy needed or other treatments needed. I will also refer you to a pain management specialist to treat those areas as well. If you need a refill of hydrocodone prior to this appointment, let me know, but ultimately would want pain management to assume your pain medications.   Return to the clinic or go to the nearest emergency room if any of your symptoms worsen or new symptoms occur.   IF you received an x-ray today, you will receive an invoice from [facility]. Please contact [facility] at [phone] with questions or concerns regarding your invoice.   IF you received labwork today, you will receive an invoice from [place]. Please contact [facility] at [phone] with questions or concerns regarding your invoice.   Our billing staff will not be able to assist you with questions regarding bills from these companies.  You will be contacted with the lab results as soon as they are available. The fastest way to get your results is to activate your My Chart account. Instructions are located on the last page of this paperwork. If you have not heard from [place] regarding the results in 2 weeks, please contact this office.       I personally performed the services described in this documentation, which was scribed in my presence. The recorded information has been reviewed and considered for accuracy and completeness, addended by me as needed, and  agree with information above.  Signed,   [name], MD Primary Care at [place].  12/20/16 10:19 PM

## 2016-12-19 NOTE — Patient Instructions (Addendum)
Call Dr. Erlinda Hong for appointment to discuss your rib and back pain. I would like him help decide if physical therapy needed or other treatments needed. I will also refer you to a pain management specialist to treat those areas as well. If you need a refill of hydrocodone prior to this appointment, let me know, but ultimately would want pain management to assume your pain medications.   Return to the clinic or go to the nearest emergency room if any of your symptoms worsen or new symptoms occur.   IF you received an x-ray today, you will receive an invoice from [facility]. Please contact [facility] at [phone] with questions or concerns regarding your invoice.   IF you received labwork today, you will receive an invoice from [place]. Please contact [facility] at [phone] with questions or concerns regarding your invoice.   Our billing staff will not be able to assist you with questions regarding bills from these companies.  You will be contacted with the lab results as soon as they are available. The fastest way to get your results is to activate your My Chart account. Instructions are located on the last page of this paperwork. If you have not heard from [place] regarding the results in 2 weeks, please contact this office.

## 2016-12-19 NOTE — Patient Instructions (Addendum)
[name] , Thank you for taking time to come for your Medicare Wellness Visit. I appreciate your ongoing commitment to your health goals. Please review the following plan we discussed and let me know if I can assist you in the future.   Screening recommendations/referrals: Colonoscopy: up to date Recommended yearly ophthalmology/optometry visit for glaucoma screening and checkup Recommended yearly dental visit for hygiene and checkup  Vaccinations: Influenza vaccine: declined, You want to wait until later in the season. Pneumococcal vaccine: up to date Tdap vaccine: up to date, next due 04/09/2019 Shingles vaccine: up to date    Advanced directives: Please bring a copy of your POA (Power of [place]) and/or Living Will to your next appointment.   Conditions/risks identified: Try to decrease your sugar intake daily.   Next appointment: today at 9:40 am  Preventive Care 81 Years and Older, Male Preventive care refers to lifestyle choices and visits with your health care provider that can promote health and wellness. What does preventive care include?  A yearly physical exam. This is also called an annual well check.  Dental exams once or twice a year.  Routine eye exams. Ask your health care provider how often you should have your eyes checked.  Personal lifestyle choices, including:  Daily care of your teeth and gums.  Regular physical activity.  Eating a healthy diet.  Avoiding tobacco and drug use.  Limiting alcohol use.  Practicing safe sex.  Taking low doses of aspirin every day.  Taking vitamin and mineral supplements as recommended by your health care provider. What happens during an annual well check? The services and screenings done by your health care provider during your annual well check will depend on your age, overall health, lifestyle risk factors, and family history of disease. Counseling  Your health care provider may ask you questions about  your:  Alcohol use.  Tobacco use.  Drug use.  Emotional well-being.  Home and relationship well-being.  Sexual activity.  Eating habits.  History of falls.  Memory and ability to understand (cognition).  Work and work Statistician. Screening  You may have the following tests or measurements:  Height, weight, and BMI.  Blood pressure.  Lipid and cholesterol levels. These may be checked every 5 years, or more frequently if you are over 62 years old.  Skin check.  Lung cancer screening. You may have this screening every year starting at age 81 if you have a 30-pack-year history of smoking and currently smoke or have quit within the past 15 years.  Fecal occult blood test (FOBT) of the stool. You may have this test every year starting at age 81.  Flexible sigmoidoscopy or colonoscopy. You may have a sigmoidoscopy every 5 years or a colonoscopy every 10 years starting at age 81.  Prostate cancer screening. Recommendations will vary depending on your family history and other risks.  Hepatitis C blood test.  Hepatitis B blood test.  Sexually transmitted disease (STD) testing.  Diabetes screening. This is done by checking your blood sugar (glucose) after you have not eaten for a while (fasting). You may have this done every 1-3 years.  Abdominal aortic aneurysm (AAA) screening. You may need this if you are a current or former smoker.  Osteoporosis. You may be screened starting at age 48 if you are at high risk. Talk with your health care provider about your test results, treatment options, and if necessary, the need for more tests. Vaccines  Your health care provider may recommend certain  vaccines, such as:  Influenza vaccine. This is recommended every year.  Tetanus, diphtheria, and acellular pertussis (Tdap, Td) vaccine. You may need a Td booster every 10 years.  Zoster vaccine. You may need this after age 26.  Pneumococcal 13-valent conjugate (PCV13) vaccine.  One dose is recommended after age 46.  Pneumococcal polysaccharide (PPSV23) vaccine. One dose is recommended after age 29. Talk to your health care provider about which screenings and vaccines you need and how often you need them. This information is not intended to replace advice given to you by your health care provider. Make sure you discuss any questions you have with your health care provider. Document Released: 04/21/2015 Document Revised: 12/13/2015 Document Reviewed: 01/24/2015 Elsevier Interactive Patient Education  2017 [place] Prevention in the Home Falls can cause injuries. They can happen to people of all ages. There are many things you can do to make your home safe and to help prevent falls. What can I do on the outside of my home?  Regularly fix the edges of walkways and driveways and fix any cracks.  Remove anything that might make you trip as you walk through a door, such as a raised step or threshold.  Trim any bushes or trees on the path to your home.  Use bright outdoor lighting.  Clear any walking paths of anything that might make someone trip, such as rocks or tools.  Regularly check to see if handrails are loose or broken. Make sure that both sides of any steps have handrails.  Any raised decks and porches should have guardrails on the edges.  Have any leaves, snow, or ice cleared regularly.  Use sand or salt on walking paths during winter.  Clean up any spills in your garage right away. This includes oil or grease spills. What can I do in the bathroom?  Use night lights.  Install grab bars by the toilet and in the tub and shower. Do not use towel bars as grab bars.  Use non-skid mats or decals in the tub or shower.  If you need to sit down in the shower, use a plastic, non-slip stool.  Keep the floor dry. Clean up any water that spills on the floor as soon as it happens.  Remove soap buildup in the tub or shower regularly.  Attach bath  mats securely with double-sided non-slip rug tape.  Do not have throw rugs and other things on the floor that can make you trip. What can I do in the bedroom?  Use night lights.  Make sure that you have a light by your bed that is easy to reach.  Do not use any sheets or blankets that are too big for your bed. They should not hang down onto the floor.  Have a firm chair that has side arms. You can use this for support while you get dressed.  Do not have throw rugs and other things on the floor that can make you trip. What can I do in the kitchen?  Clean up any spills right away.  Avoid walking on wet floors.  Keep items that you use a lot in easy-to-reach places.  If you need to reach something above you, use a strong step stool that has a grab bar.  Keep electrical cords out of the way.  Do not use floor polish or wax that makes floors slippery. If you must use wax, use non-skid floor wax.  Do not have throw rugs and other things  on the floor that can make you trip. What can I do with my stairs?  Do not leave any items on the stairs.  Make sure that there are handrails on both sides of the stairs and use them. Fix handrails that are broken or loose. Make sure that handrails are as long as the stairways.  Check any carpeting to make sure that it is firmly attached to the stairs. Fix any carpet that is loose or worn.  Avoid having throw rugs at the top or bottom of the stairs. If you do have throw rugs, attach them to the floor with carpet tape.  Make sure that you have a light switch at the top of the stairs and the bottom of the stairs. If you do not have them, ask someone to add them for you. What else can I do to help prevent falls?  Wear shoes that:  Do not have high heels.  Have rubber bottoms.  Are comfortable and fit you well.  Are closed at the toe. Do not wear sandals.  If you use a stepladder:  Make sure that it is fully opened. Do not climb a closed  stepladder.  Make sure that both sides of the stepladder are locked into place.  Ask someone to hold it for you, if possible.  Clearly mark and make sure that you can see:  Any grab bars or handrails.  First and last steps.  Where the edge of each step is.  Use tools that help you move around (mobility aids) if they are needed. These include:  Canes.  Walkers.  Scooters.  Crutches.  Turn on the lights when you go into a dark area. Replace any light bulbs as soon as they burn out.  Set up your furniture so you have a clear path. Avoid moving your furniture around.  If any of your floors are uneven, fix them.  If there are any pets around you, be aware of where they are.  Review your medicines with your doctor. Some medicines can make you feel dizzy. This can increase your chance of falling. Ask your doctor what other things that you can do to help prevent falls. This information is not intended to replace advice given to you by your health care provider. Make sure you discuss any questions you have with your health care provider. Document Released: 01/19/2009 Document Revised: 08/31/2015 Document Reviewed: 04/29/2014 Elsevier Interactive Patient Education  2017 [employer].

## 2017-01-02 ENCOUNTER — Other Ambulatory Visit: Payer: Self-pay | Admitting: Family Medicine

## 2017-01-02 DIAGNOSIS — R0789 Other chest pain: Secondary | ICD-10-CM

## 2017-01-02 DIAGNOSIS — S2242XD Multiple fractures of ribs, left side, subsequent encounter for fracture with routine healing: Secondary | ICD-10-CM

## 2017-01-02 NOTE — Telephone Encounter (Signed)
PT sent MyChart message to refill pain medsv - Hydrocodone To Dr Carlota Raspberry

## 2017-01-02 NOTE — Telephone Encounter (Signed)
Pt is needing a refill on his pain meds   [facility] [phone]

## 2017-01-03 NOTE — Telephone Encounter (Signed)
Pt called to check the status of rx req. Still in pain still out of rx

## 2017-01-04 NOTE — Telephone Encounter (Signed)
Pt. Called again to check on the status of his hydrocodone prescription.

## 2017-01-06 NOTE — Telephone Encounter (Signed)
Please call pt when meds are refilled thanks

## 2017-01-08 ENCOUNTER — Encounter: Payer: Self-pay | Admitting: Cardiovascular Disease

## 2017-01-08 ENCOUNTER — Ambulatory Visit (INDEPENDENT_AMBULATORY_CARE_PROVIDER_SITE_OTHER): Payer: Commercial Managed Care - HMO | Admitting: Cardiovascular Disease

## 2017-01-08 DIAGNOSIS — I2510 Atherosclerotic heart disease of native coronary artery without angina pectoris: Secondary | ICD-10-CM

## 2017-01-08 DIAGNOSIS — I10 Essential (primary) hypertension: Secondary | ICD-10-CM | POA: Diagnosis not present

## 2017-01-08 DIAGNOSIS — E7800 Pure hypercholesterolemia, unspecified: Secondary | ICD-10-CM

## 2017-01-08 DIAGNOSIS — I472 Ventricular tachycardia: Secondary | ICD-10-CM

## 2017-01-08 DIAGNOSIS — I4729 Other ventricular tachycardia: Secondary | ICD-10-CM

## 2017-01-08 MED ORDER — HYDROCODONE-ACETAMINOPHEN 5-325 MG PO TABS: 1.0000 | ORAL_TABLET | Freq: Four times a day (QID) | ORAL | 0 refills | Status: DC | PRN

## 2017-01-08 NOTE — Assessment & Plan Note (Signed)
History of essential hypertension blood pressure measures 138/64. He is on lisinopril, hydrochlorothiazide and metoprolol. Continue current meds at current dosing.

## 2017-01-08 NOTE — Assessment & Plan Note (Signed)
History of CAD status post LAD stenting with a Promus drug-eluting stent October 2009. His last Myoview performed 04/11/11 was normal. He denies chest pain or shortness of breath.

## 2017-01-08 NOTE — Telephone Encounter (Signed)
Refilled hydrocodone, but please check into status of pain management referral. I do not see any notes regarding an upcoming appointment.

## 2017-01-08 NOTE — Assessment & Plan Note (Signed)
History of ventricular tachycardia and the past status post aborted VT ablation by Dr. Rayann Heman because of inappropriate substrate. This has not been a clinical issue since.

## 2017-01-08 NOTE — Assessment & Plan Note (Signed)
History of hyperlipidemia on statin therapy with recent lipid profile performed 06/13/16 revealing an LDL 72 and HDL of 54.

## 2017-01-08 NOTE — Progress Notes (Signed)
01/08/2017 [name]   [birth year]  [id number]  Primary Physician [name], MD Primary Cardiologist: Lorretta Harp MD FACP, [place], [place], [place]  HPI:  [name] is a 83 y.o. male moderately overweight, recently remarried for the third time (09/30/14) [place] American male, father of 3 children and 14 stepchildren, grandfather to 20 grandchildren, who is formerly a patient of Dr. Francine Graven. I last saw him 02/21/16. He has a history of CAD status post LAD stenting with a Promus drug-eluting stent, October of 2009. He had attempt at VT ablation by Dr. Thompson Grayer, February 2010; however, this was aborted because of inappropriate substrate. His other problems include hypertension, hyperlipidemia, diabetes, as well as history of prostate cancer. Since I saw him, he has been asymptomatic. His last Myoview performed 04/11/11 was completely normal. He did stop his aspirin because of rectal bleeding is noticed increased evening flushing with Niaspan. His most recent lipid profile performed 08/30/15 revealed total cholesterol of 36, LDL 68 and HDL 48. He did fall down and crack 3 of his ribs several weeks ago and is in significant amount of pain. It sounds like he had a thoracentesis subsequent to that. Since I saw me her ago he's remained medically stable. He still healing from his cracked ribs but denies chest pain or shortness of breath.   Current Meds  Medication Sig  . atorvastatin (LIPITOR) 10 MG tablet TAKE 1 TABLET (10 MG TOTAL) BY MOUTH DAILY.  Marland Kitchen Blood Glucose Monitoring Suppl (ACCU-CHEK AVIVA PLUS) w/Device KIT Test blood sugar 3 times daily. Dx code: E11.22  . busPIRone (BUSPAR) 5 MG tablet Take 0.5-2 tablets (2.5-10 mg total) by mouth 3 (three) times daily.  . ferrous sulfate 325 (65 FE) MG tablet Take 325 mg by mouth daily with breakfast.  . GAVILYTE-G 236 g solution   . glucose blood (ACCU-CHEK AVIVA PLUS) test strip Test blood sugar 3 times daily. Dx code: E11.22  .  HYDROcodone-acetaminophen (NORCO/VICODIN) 5-325 MG tablet Take 1-2 tablets by mouth every 6 (six) hours as needed for moderate pain.  Marland Kitchen insulin NPH-regular Human (NOVOLIN 70/30) (70-30) 100 UNIT/ML injection Inject 7 Units into the skin 2 (two) times daily with a meal.  . Insulin Syringe-Needle U-100 (B-D INS SYR HALF-UNIT .3CC/31G) 31G X 5/16" 0.3 ML MISC Use daily at bedtime to inject insulin. Dx code: 250.00  . Lancets (ACCU-CHEK MULTICLIX) lancets Test blood sugar 3 times daily. Dx code: E11.22  . lisinopril (PRINIVIL,ZESTRIL) 20 MG tablet TAKE 1 TABLET EVERY DAY  . lisinopril-hydrochlorothiazide (PRINZIDE,ZESTORETIC) 20-25 MG tablet TAKE 1 TABLET EVERY DAY  . metFORMIN (GLUCOPHAGE) 1000 MG tablet TAKE 1 TABLET TWICE DAILY WITH MEALS  . metoprolol tartrate (LOPRESSOR) 50 MG tablet TAKE 1 TABLET TWICE DAILY  . Multiple Vitamin (MULTIVITAMIN) tablet Take 1 tablet by mouth daily.  Marland Kitchen NOVOLIN 70/30 (70-30) 100 UNIT/ML injection INJECT 6 UNITS UNDER THE SKIN TWICE DAILY  . Omega-3 Fatty Acids (FISH OIL) 1000 MG CAPS Take 2 capsules by mouth daily.   . sildenafil (VIAGRA) 50 MG tablet Take 1 tablet (50 mg total) by mouth daily as needed for erectile dysfunction.  . Vitamin D, Cholecalciferol, 1000 UNITS TABS Take 1 tablet by mouth daily.   . VOLTAREN 1 % GEL      Allergies  Allergen Reactions  . Contrast Media [Iodinated Diagnostic Agents] Swelling    Possible neck swelling after contrast for chest CT 03/2016. No hives, throat or other symptoms    Social  History   Social History  . Marital status: Legally Separated    Spouse name: [name]  . Number of children: 7  . Years of education: [name]   Occupational History  . RETIRED Retired   Social History Main Topics  . Smoking status: Current Some Day Smoker    Packs/day: 0.00    Years: 15.00    Types: Cigarettes    Last attempt to quit: 09/01/1990  . Smokeless tobacco: Never Used     Comment: Patient smokes occasionally. Not everyday.  .  Alcohol use No  . Drug use: No  . Sexual activity: No   Other Topics Concern  . Not on file   Social History Narrative   29 grandchildren. Single. Education: 12 th grade. Exercise: 3-4 times a week for 30 minutes working out and set ups.     Review of Systems: General: negative for chills, fever, night sweats or weight changes.  Cardiovascular: negative for chest pain, dyspnea on exertion, edema, orthopnea, palpitations, paroxysmal nocturnal dyspnea or shortness of breath Dermatological: negative for rash Respiratory: negative for cough or wheezing Urologic: negative for hematuria Abdominal: negative for nausea, vomiting, diarrhea, bright red blood per rectum, melena, or hematemesis Neurologic: negative for visual changes, syncope, or dizziness All other systems reviewed and are otherwise negative except as noted above.    Blood pressure 138/64, pulse 76, height 6' (1.829 m), weight 223 lb (101.2 kg).  General appearance: alert and no distress Neck: no adenopathy, no carotid bruit, no JVD, supple, symmetrical, trachea midline and thyroid not enlarged, symmetric, no tenderness/mass/nodules Lungs: clear to auscultation bilaterally Heart: regular rate and rhythm, S1, S2 normal, no murmur, click, rub or gallop Extremities: extremities normal, atraumatic, no cyanosis or edema Pulses: 2+ and symmetric Skin: Skin color, texture, turgor normal. No rashes or lesions Neurologic: Alert and oriented X 3, normal strength and tone. Normal symmetric reflexes. Normal coordination and gait  EKG not performed today.  ASSESSMENT AND PLAN:   CAD, NATIVE VESSEL History of CAD status post LAD stenting with a Promus drug-eluting stent October 2009. His last Myoview performed 04/11/11 was normal. He denies chest pain or shortness of breath.  VENTRICULAR TACHYCARDIA History of ventricular tachycardia and the past status post aborted VT ablation by Dr. Rayann Heman because of inappropriate substrate. This has  not been a clinical issue since.  Hyperlipidemia History of hyperlipidemia on statin therapy with recent lipid profile performed 06/13/16 revealing an LDL 72 and HDL of 54.  Essential hypertension, benign History of essential hypertension blood pressure measures 138/64. He is on lisinopril, hydrochlorothiazide and metoprolol. Continue current meds at current dosing.      Lorretta Harp MD FACP,FACC,FAHA, [facility] 01/08/2017 9:56 AM

## 2017-01-08 NOTE — Patient Instructions (Signed)

## 2017-01-09 NOTE — Telephone Encounter (Signed)
Spoke with pt.  States he has not made appt with Pain mgmt.  Has been busy and has not made his mind up to make appt.  Advised they are the experts in pain mgmt and our practice does not continue to prescribe opioids.  He will discuss with Dr. Carlota Raspberry on his 01/21/2017 return visit.   Rx at front desk.  Advised pt.

## 2017-01-14 DIAGNOSIS — Z79899 Other long term (current) drug therapy: Secondary | ICD-10-CM | POA: Diagnosis not present

## 2017-01-14 DIAGNOSIS — M545 Low back pain: Secondary | ICD-10-CM | POA: Diagnosis not present

## 2017-01-14 DIAGNOSIS — Z8781 Personal history of (healed) traumatic fracture: Secondary | ICD-10-CM | POA: Diagnosis not present

## 2017-01-14 DIAGNOSIS — G8929 Other chronic pain: Secondary | ICD-10-CM | POA: Diagnosis not present

## 2017-01-15 ENCOUNTER — Encounter: Payer: Self-pay | Admitting: Physician Assistant

## 2017-01-15 ENCOUNTER — Ambulatory Visit (INDEPENDENT_AMBULATORY_CARE_PROVIDER_SITE_OTHER): Payer: Medicare HMO | Admitting: Physician Assistant

## 2017-01-15 VITALS — BP 150/70 | HR 68 | Temp 98.1°F | Resp 16 | Ht 70.0 in | Wt 224.0 lb

## 2017-01-15 DIAGNOSIS — R42 Dizziness and giddiness: Secondary | ICD-10-CM

## 2017-01-15 LAB — CMP14+EGFR
ALT: 13 IU/L (ref 0–44)
AST: 15 IU/L (ref 0–40)
Albumin/Globulin Ratio: 1.5 (ref 1.2–2.2)
Albumin: 3.8 g/dL (ref 3.5–4.7)
Alkaline Phosphatase: 41 IU/L (ref 39–117)
BUN/Creatinine Ratio: 13 (ref 10–24)
BUN: 20 mg/dL (ref 8–27)
Bilirubin Total: 0.3 mg/dL (ref 0.0–1.2)
CO2: 19 mmol/L — ABNORMAL LOW (ref 20–29)
Calcium: 9.2 mg/dL (ref 8.6–10.2)
Chloride: 104 mmol/L (ref 96–106)
Creatinine, Ser: 1.59 mg/dL — ABNORMAL HIGH (ref 0.76–1.27)
GFR calc Af Amer: 47 mL/min/{1.73_m2} — ABNORMAL LOW (ref >59)
GFR calc non Af Amer: 40 mL/min/{1.73_m2} — ABNORMAL LOW (ref >59)
Globulin, Total: 2.6 g/dL (ref 1.5–4.5)
Glucose: 140 mg/dL — ABNORMAL HIGH (ref 65–99)
Potassium: 4.6 mmol/L (ref 3.5–5.2)
Sodium: 138 mmol/L (ref 134–144)
Total Protein: 6.4 g/dL (ref 6.0–8.5)

## 2017-01-15 LAB — GLUCOSE, POCT (MANUAL RESULT ENTRY): POC Glucose: 149 mg/dl — AB (ref 70–99)

## 2017-01-15 NOTE — Patient Instructions (Addendum)
Our CBC machine is down today - we will contact you if your blood counts come back low.  Come back if your symptoms return. Please go to the emergency department if your symptoms happen again and are significantly worse.  Stay well hydrated.  Continue to control your blood sugar.  I look forward to reading your book!!!!!!!!!!!!  Thank you for coming in today. I hope you feel we met your needs.  Feel free to call PCP if you have any questions or further requests.  Please consider signing up for MyChart if you do not already have it, as this is a great way to communicate with me.  Best,  [place] McVey, PA-C    Benign Positional Vertigo Vertigo is the feeling that you or your surroundings are moving when they are not. Benign positional vertigo is the most common form of vertigo. The cause of this condition is not serious (is benign). This condition is triggered by certain movements and positions (is positional). This condition can be dangerous if it occurs while you are doing something that could endanger you or others, such as driving. What are the causes? In many cases, the cause of this condition is not known. It may be caused by a disturbance in an area of the inner ear that helps your brain to sense movement and balance. This disturbance can be caused by a viral infection (labyrinthitis), head injury, or repetitive motion. What increases the risk? This condition is more likely to develop in:  Women.  People who are 28 years of age or older.  What are the signs or symptoms? Symptoms of this condition usually happen when you move your head or your eyes in different directions. Symptoms may start suddenly, and they usually last for less than a minute. Symptoms may include:  Loss of balance and falling.  Feeling like you are spinning or moving.  Feeling like your surroundings are spinning or moving.  Nausea and vomiting.  Blurred vision.  Dizziness.  Involuntary eye movement  (nystagmus).  Symptoms can be mild and cause only slight annoyance, or they can be severe and interfere with daily life. Episodes of benign positional vertigo may return (recur) over time, and they may be triggered by certain movements. Symptoms may improve over time. How is this diagnosed? This condition is usually diagnosed by medical history and a physical exam of the head, neck, and ears. You may be referred to a health care provider who specializes in ear, nose, and throat (ENT) problems (otolaryngologist) or a provider who specializes in disorders of the nervous system (neurologist). You may have additional testing, including:  MRI.  A CT scan.  Eye movement tests. Your health care provider may ask you to change positions quickly while he or she watches you for symptoms of benign positional vertigo, such as nystagmus. Eye movement may be tested with an electronystagmogram (ENG), caloric stimulation, the Dix-Hallpike test, or the roll test.  An electroencephalogram (EEG). This records electrical activity in your brain.  Hearing tests.  How is this treated? Usually, your health care provider will treat this by moving your head in specific positions to adjust your inner ear back to normal. Surgery may be needed in severe cases, but this is rare. In some cases, benign positional vertigo may resolve on its own in 2-4 weeks. Follow these instructions at home: Safety  Move slowly.Avoid sudden body or head movements.  Avoid driving.  Avoid operating heavy machinery.  Avoid doing any tasks that would be  dangerous to you or others if a vertigo episode would occur.  If you have trouble walking or keeping your balance, try using a cane for stability. If you feel dizzy or unstable, sit down right away.  Return to your normal activities as told by your health care provider. Ask your health care provider what activities are safe for you. General instructions  Take over-the-counter and  prescription medicines only as told by your health care provider.  Avoid certain positions or movements as told by your health care provider.  Drink enough fluid to keep your urine clear or pale yellow.  Keep all follow-up visits as told by your health care provider. This is important. Contact a health care provider if:  You have a fever.  Your condition gets worse or you develop new symptoms.  Your family or friends notice any behavioral changes.  Your nausea or vomiting gets worse.  You have numbness or a "pins and needles" sensation. Get help right away if:  You have difficulty speaking or moving.  You are always dizzy.  You faint.  You develop severe headaches.  You have weakness in your legs or arms.  You have changes in your hearing or vision.  You develop a stiff neck.  You develop sensitivity to light. This information is not intended to replace advice given to you by your health care provider. Make sure you discuss any questions you have with your health care provider. Document Released: 12/31/2005 Document Revised: 08/31/2015 Document Reviewed: 07/18/2014 Elsevier Interactive Patient Education  2018 [employer].   IF you received an x-ray today, you will receive an invoice from [facility]. Please contact [facility] at [phone] with questions or concerns regarding your invoice.   IF you received labwork today, you will receive an invoice from [place]. Please contact [facility] at [phone] with questions or concerns regarding your invoice.   Our billing staff will not be able to assist you with questions regarding bills from these companies.  You will be contacted with the lab results as soon as they are available. The fastest way to get your results is to activate your My Chart account. Instructions are located on the last page of this paperwork. If you have not heard from [place] regarding the results in 2 weeks, please contact this  office.

## 2017-01-15 NOTE — Progress Notes (Signed)
[name]  MRN: [id number] DOB: September 14, 1933  PCP: [name], MD  Subjective:  Patient is a pleasant 83 year old male PMH CAD, HTN, DM, HLD, anemia, prostate cancer who presents for evaluation of dizziness. 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 Married to [place], whom is his third wife. He plans to write a book about his life.  He was standing on his front porch drinking coffee this morning when he walked inside. Symptoms began as he turned to close the door. The symptoms started 2 hours ago and have essentially resolved. He has never experienced this before. The episode lasted about 5-8 seconds.  Previous workup/treatments: none. Associated ear symptoms: none. Associated CNS symptoms: none. Recent infections: none. Head trauma: denied. Drug ingestion: none. Noise exposure: no occupational exposure. Denies recent medication changes. He is compliant with his DM medications. He ate dinner last night.  Denies abnormal gait, n/v, headache, double vision, visual loss, slurred speech, numbness of the face or body, weakness, clumsiness, incoordination, palpitations, chest pain, shob, or dyspnea, ringing in his ears.   He had his annual appt with his cardiologist last week "everything looked great". Next appt is in one year.   Review of Systems  Constitutional: Negative for chills, diaphoresis and fatigue.  Eyes: Negative for visual disturbance.  Respiratory: Negative for shortness of breath and wheezing.   Cardiovascular: Negative for chest pain and palpitations.  Gastrointestinal: Negative for nausea and vomiting.  Neurological: Positive for dizziness. Negative for syncope, facial asymmetry, speech difficulty, weakness, light-headedness and headaches.  Psychiatric/Behavioral: Negative for confusion and decreased concentration. The patient is not nervous/anxious.     Patient Active Problem List   Diagnosis Date Noted  . Rib pain on left side 07/01/2016  . Fall 12/14/2015    . Elevated serum creatinine 12/14/2015  . Ileus ([place]) 12/14/2015  . Multiple fractures of ribs of left side 12/12/2015  . Blood in the urine 12/01/2013  . Cystitis, radiation 12/01/2013  . Malignant neoplasm of prostate ([place]) 11/26/2013  . Pulsatile tinnitus 11/19/2013  . Rectal bleeding 12/23/2012  . Type II or unspecified type diabetes mellitus without mention of complication, not stated as uncontrolled   . Essential hypertension, benign   . Chronic back pain   . ED (erectile dysfunction)   . Anemia   . Hyperlipidemia   . CAD, NATIVE VESSEL 05/08/2008  . VENTRICULAR TACHYCARDIA 05/08/2008  . PREMATURE VENTRICULAR CONTRACTIONS 05/08/2008    Current Outpatient Prescriptions on File Prior to Visit  Medication Sig Dispense Refill  . atorvastatin (LIPITOR) 10 MG tablet TAKE 1 TABLET (10 MG TOTAL) BY MOUTH DAILY. 90 tablet 1  . Blood Glucose Monitoring Suppl (ACCU-CHEK AVIVA PLUS) w/Device KIT Test blood sugar 3 times daily. Dx code: E11.22 1 kit 0  . busPIRone (BUSPAR) 5 MG tablet Take 0.5-2 tablets (2.5-10 mg total) by mouth 3 (three) times daily. 30 tablet 0  . ferrous sulfate 325 (65 FE) MG tablet Take 325 mg by mouth daily with breakfast.    . GAVILYTE-G 236 g solution     . glucose blood (ACCU-CHEK AVIVA PLUS) test strip Test blood sugar 3 times daily. Dx code: E11.22 300 each 3  . HYDROcodone-acetaminophen (NORCO/VICODIN) 5-325 MG tablet Take 1-2 tablets by mouth every 6 (six) hours as needed for moderate pain. 45 tablet 0  . insulin NPH-regular Human (NOVOLIN 70/30) (70-30) 100 UNIT/ML injection Inject 7 Units into the skin 2 (two) times daily with a meal. 10 mL 2  .  Insulin Syringe-Needle U-100 (B-D INS SYR HALF-UNIT .3CC/31G) 31G X 5/16" 0.3 ML MISC Use daily at bedtime to inject insulin. Dx code: 250.00 100 each 3  . Lancets (ACCU-CHEK MULTICLIX) lancets Test blood sugar 3 times daily. Dx code: E11.22 300 each 3  . lisinopril (PRINIVIL,ZESTRIL) 20 MG tablet TAKE 1 TABLET EVERY  DAY 90 tablet 2  . lisinopril-hydrochlorothiazide (PRINZIDE,ZESTORETIC) 20-25 MG tablet TAKE 1 TABLET EVERY DAY 90 tablet 1  . metFORMIN (GLUCOPHAGE) 1000 MG tablet TAKE 1 TABLET TWICE DAILY WITH MEALS 180 tablet 1  . metoprolol tartrate (LOPRESSOR) 50 MG tablet TAKE 1 TABLET TWICE DAILY 180 tablet 1  . Multiple Vitamin (MULTIVITAMIN) tablet Take 1 tablet by mouth daily.    . Omega-3 Fatty Acids (FISH OIL) 1000 MG CAPS Take 2 capsules by mouth daily.     . Vitamin D, Cholecalciferol, 1000 UNITS TABS Take 1 tablet by mouth daily.     . VOLTAREN 1 % GEL     . NOVOLIN 70/30 (70-30) 100 UNIT/ML injection INJECT 6 UNITS UNDER THE SKIN TWICE DAILY (Patient not taking: Reported on 01/15/2017) 10 mL 0  . sildenafil (VIAGRA) 50 MG tablet Take 1 tablet (50 mg total) by mouth daily as needed for erectile dysfunction. (Patient not taking: Reported on 01/15/2017) 10 tablet 0   No current facility-administered medications on file prior to visit.     Allergies  Allergen Reactions  . Contrast Media [Iodinated Diagnostic Agents] Swelling    Possible neck swelling after contrast for chest CT 03/2016. No hives, throat or other symptoms     Objective:  BP (!) 150/70   Pulse 68   Temp 98.1 F (36.7 C) (Oral)   Resp 16   Ht 5' 10"  (1.778 m)   Wt 224 lb (101.6 kg)   SpO2 99%   BMI 32.14 kg/m   Physical Exam  Constitutional: He is oriented to person, place, and time and well-developed, well-nourished, and in no distress. No distress.  HENT:  Right Ear: Tympanic membrane normal.  Left Ear: Tympanic membrane normal.  Eyes: Pupils are equal, round, and reactive to light. Conjunctivae are normal. Right eye exhibits no nystagmus. Left eye exhibits no nystagmus.  Cardiovascular: Normal rate, regular rhythm, S1 normal, S2 normal, normal heart sounds, intact distal pulses and normal pulses.   Neurological: He is alert and oriented to person, place, and time. He has normal motor skills. He displays normal  reflexes. No sensory deficit. He has a normal Heel to [employer]. He shows no pronator drift. Gait normal. GCS score is 15.  Negative dix Hallpike maneuver  Skin: Skin is warm and dry.  Psychiatric: Mood, memory, affect and judgment normal.  Vitals reviewed.  Results for orders placed or performed in visit on 01/15/17  POCT glucose (manual entry)  Result Value Ref Range   POC Glucose 149 (A) 70 - 99 mg/dl    Assessment and Plan :  1. Dizziness - POCT CBC - Orthostatic vital signs - POCT glucose (manual entry) - CMP14+EGFR - Pt c/o brief, self-resolving episode of dizziness which lasted about 5-8 seconds. Negative work-up today. Mr. Senger was recently evaluated by cardiologist for his annual check-up, negative work-up. POCT glucose is fine today. Negative neuro exam. CBC machine is down - will send out. Negative Marye Round. Suspect possible BPPV or vasovagal episode. RTC if symptoms return, consider possible head CT. Advised pt to go to the emergency dept if symptoms worsen. He understands and agrees.    [place] Valentine Kuechle,  PA-C  [facility] [place] 01/15/2017 9:35 AM

## 2017-01-16 LAB — CBC WITH DIFFERENTIAL/PLATELET
Basophils Absolute: 0 10*3/uL (ref 0.0–0.2)
Basos: 0 %
EOS (ABSOLUTE): 0.2 10*3/uL (ref 0.0–0.4)
Eos: 4 %
Hematocrit: 39.7 % (ref 37.5–51.0)
Hemoglobin: 12.5 g/dL — ABNORMAL LOW (ref 13.0–17.7)
Immature Grans (Abs): 0 10*3/uL (ref 0.0–0.1)
Immature Granulocytes: 0 %
Lymphocytes Absolute: 1.3 10*3/uL (ref 0.7–3.1)
Lymphs: 19 %
MCH: 25.5 pg — ABNORMAL LOW (ref 26.6–33.0)
MCHC: 31.5 g/dL (ref 31.5–35.7)
MCV: 81 fL (ref 79–97)
Monocytes Absolute: 0.6 10*3/uL (ref 0.1–0.9)
Monocytes: 9 %
Neutrophils Absolute: 4.7 10*3/uL (ref 1.4–7.0)
Neutrophils: 68 %
Platelets: 214 10*3/uL (ref 150–379)
RBC: 4.9 x10E6/uL (ref 4.14–5.80)
RDW: 16.3 % — ABNORMAL HIGH (ref 12.3–15.4)
WBC: 6.9 10*3/uL (ref 3.4–10.8)

## 2017-01-21 ENCOUNTER — Ambulatory Visit: Payer: Medicare HMO | Admitting: Family Medicine

## 2017-01-23 DIAGNOSIS — G8929 Other chronic pain: Secondary | ICD-10-CM | POA: Diagnosis not present

## 2017-01-23 DIAGNOSIS — M545 Low back pain: Secondary | ICD-10-CM | POA: Diagnosis not present

## 2017-01-23 DIAGNOSIS — M47814 Spondylosis without myelopathy or radiculopathy, thoracic region: Secondary | ICD-10-CM | POA: Diagnosis not present

## 2017-01-23 DIAGNOSIS — M5136 Other intervertebral disc degeneration, lumbar region: Secondary | ICD-10-CM | POA: Diagnosis not present

## 2017-01-24 ENCOUNTER — Encounter: Payer: Self-pay | Admitting: Family Medicine

## 2017-01-24 ENCOUNTER — Ambulatory Visit (INDEPENDENT_AMBULATORY_CARE_PROVIDER_SITE_OTHER): Payer: Medicare HMO | Admitting: Family Medicine

## 2017-01-24 VITALS — BP 124/80 | HR 73 | Temp 98.2°F | Resp 17 | Ht 70.0 in | Wt 223.0 lb

## 2017-01-24 DIAGNOSIS — N183 Chronic kidney disease, stage 3 unspecified: Secondary | ICD-10-CM

## 2017-01-24 DIAGNOSIS — E785 Hyperlipidemia, unspecified: Secondary | ICD-10-CM

## 2017-01-24 DIAGNOSIS — Z794 Long term (current) use of insulin: Secondary | ICD-10-CM

## 2017-01-24 DIAGNOSIS — R0781 Pleurodynia: Secondary | ICD-10-CM | POA: Diagnosis not present

## 2017-01-24 DIAGNOSIS — S2242XG Multiple fractures of ribs, left side, subsequent encounter for fracture with delayed healing: Secondary | ICD-10-CM

## 2017-01-24 DIAGNOSIS — R0789 Other chest pain: Secondary | ICD-10-CM | POA: Diagnosis not present

## 2017-01-24 DIAGNOSIS — E1122 Type 2 diabetes mellitus with diabetic chronic kidney disease: Secondary | ICD-10-CM | POA: Diagnosis not present

## 2017-01-24 DIAGNOSIS — Z720 Tobacco use: Secondary | ICD-10-CM

## 2017-01-24 MED ORDER — HYDROCODONE-ACETAMINOPHEN 5-325 MG PO TABS: 1.0000 | ORAL_TABLET | Freq: Four times a day (QID) | ORAL | 0 refills | Status: DC | PRN
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
## 2017-01-25 LAB — BASIC METABOLIC PANEL
BUN/Creatinine Ratio: 11 (ref 10–24)
BUN: 17 mg/dL (ref 8–27)
CO2: 21 mmol/L (ref 20–29)
Calcium: 9.2 mg/dL (ref 8.6–10.2)
Chloride: 102 mmol/L (ref 96–106)
Creatinine, Ser: 1.54 mg/dL — ABNORMAL HIGH (ref 0.76–1.27)
GFR calc Af Amer: 49 mL/min/{1.73_m2} — ABNORMAL LOW (ref >59)
GFR calc non Af Amer: 42 mL/min/{1.73_m2} — ABNORMAL LOW (ref >59)
Glucose: 140 mg/dL — ABNORMAL HIGH (ref 65–99)
Potassium: 4.5 mmol/L (ref 3.5–5.2)
Sodium: 137 mmol/L (ref 134–144)

## 2017-01-25 LAB — LIPID PANEL
Chol/HDL Ratio: 3 ratio (ref 0.0–5.0)
Cholesterol, Total: 129 mg/dL (ref 100–199)
HDL: 43 mg/dL (ref >39)
LDL Calculated: 59 mg/dL (ref 0–99)
Triglycerides: 133 mg/dL (ref 0–149)
VLDL Cholesterol Cal: 27 mg/dL (ref 5–40)

## 2017-01-25 LAB — HEMOGLOBIN A1C
Est. average glucose Bld gHb Est-mCnc: 163 mg/dL
Hgb A1c MFr Bld: 7.3 % — ABNORMAL HIGH (ref 4.8–5.6)

## 2017-01-29 ENCOUNTER — Encounter (INDEPENDENT_AMBULATORY_CARE_PROVIDER_SITE_OTHER): Payer: Self-pay | Admitting: Orthopaedic Surgery

## 2017-01-29 ENCOUNTER — Ambulatory Visit (INDEPENDENT_AMBULATORY_CARE_PROVIDER_SITE_OTHER): Payer: Medicare HMO | Admitting: Orthopaedic Surgery

## 2017-01-29 DIAGNOSIS — S2242XD Multiple fractures of ribs, left side, subsequent encounter for fracture with routine healing: Secondary | ICD-10-CM

## 2017-01-29 MED ORDER — DICLOFENAC EPOLAMINE 1.3 % TD PTCH
1.0000 | MEDICATED_PATCH | Freq: Two times a day (BID) | TRANSDERMAL | 3 refills | Status: DC
Start: 2017-01-29 — End: 2018-01-12

## 2017-01-29 NOTE — Progress Notes (Signed)
Office Visit Note   Patient: [name]           Date of Birth: November 23, 1933           MRN: [id number] Visit Date: 01/29/2017              Requested by: [name], MD [address] [place], [place] [address] PCP: [name], MD   Assessment & Plan: Visit Diagnoses:  1. Closed fracture of multiple ribs of left side with routine healing, subsequent encounter     Plan: Patient has slowly healing rib fractures that appear to be stable on x-rays.  Prescription for Flector patch.  Patient declined physical therapy.  Activity as tolerated.  Questions encouraged and answered.  Follow-up as needed.  Follow-Up Instructions: Return if symptoms worsen or fail to improve.   Orders:  No orders of the defined types were placed in this encounter.  Meds ordered this encounter  Medications  . diclofenac (FLECTOR) 1.3 % PTCH    Sig: Place 1 patch onto the skin 2 (two) times daily.    Dispense:  30 patch    Refill:  3      Procedures: No procedures performed   Clinical Data: No additional findings.   Subjective: No chief complaint on file.   [name] is following up today for continued left sided rib pain status post multiple rib fractures about 6-7 months ago.  He states that he is 85% better but still has some discomfort with swinging a golf club and with certain activities.  He still has to take Norco occasionally.  Denies any numbness and tingling.  His primary care doctor referred him to [place] for further evaluation and treatment.    Review of Systems  Constitutional: Negative.   All other systems reviewed and are negative.    Objective: Vital Signs: There were no vitals taken for this visit.  Physical Exam  Constitutional: He is oriented to person, place, and time. He appears well-developed and well-nourished.  Pulmonary/Chest: Effort normal.  Abdominal: Soft.  Neurological: He is alert and oriented to person, place, and time.  Skin: Skin is warm.    Psychiatric: He has a normal mood and affect. His behavior is normal. Judgment and thought content normal.  Nursing note and vitals reviewed.   Ortho Exam Left chest exam shows nonlabored breathing.  He does have tenderness over the lower ribs.  There is no obvious crepitus.  There is no bruising or swelling. Specialty Comments:  No specialty comments available.  Imaging: No results found.   PMFS History: Patient Active Problem List   Diagnosis Date Noted  . Rib pain on left side 07/01/2016  . Fall 12/14/2015  . Elevated serum creatinine 12/14/2015  . Ileus ([place]) 12/14/2015  . Multiple fractures of ribs of left side 12/12/2015  . Blood in the urine 12/01/2013  . Cystitis, radiation 12/01/2013  . Malignant neoplasm of prostate ([place]) 11/26/2013  . Pulsatile tinnitus 11/19/2013  . Rectal bleeding 12/23/2012  . Type II or unspecified type diabetes mellitus without mention of complication, not stated as uncontrolled   . Essential hypertension, benign   . Chronic back pain   . ED (erectile dysfunction)   . Anemia   . Hyperlipidemia   . CAD, NATIVE VESSEL 05/08/2008  . VENTRICULAR TACHYCARDIA 05/08/2008  . PREMATURE VENTRICULAR CONTRACTIONS 05/08/2008   Past Medical History:  Diagnosis Date  . Allergy   . CAD (coronary artery disease)    pci to  LAD 10/09; stable CAD by cath 05/31/08; myoview 04/11/11- no ishcemia, EF 67%; echo 05/15/09- EF>55%, mod calcification of the aortic valve leaflets  . Chronic back pain   . ED (erectile dysfunction)   . Essential hypertension, benign   . Hyperlipidemia   . Multiple rib fractures    left 9th, 10th, and 11th posterior rib fractures S/P fall 12/09/2015/notes 12/12/2015  . Prostate cancer ([place])    s/p  . Type II or unspecified type diabetes mellitus without mention of complication, not stated as uncontrolled   . Ventricular tachycardia ([place])    resuscitated; monitor 05/2008; attempted T ablation 2/10- aborted due to inappropriate substrate     Family History  Problem Relation Age of Onset  . Diabetes Mother   . Heart attack Mother   . Leukemia Father   . Heart disease Sister   . Heart disease Brother   . Diabetes Brother        pancreatic cancer    Past Surgical History:  Procedure Laterality Date  . CARDIAC CATHETERIZATION  05/31/08   EF 55-60%, stable lesions, medical therapy  . COLECTOMY  '80's   polyps  . CORONARY ANGIOPLASTY WITH STENT PLACEMENT  01/19/08   PCI stent to mid LAD with Promus DES 27.5x28  . IR GENERIC HISTORICAL  01/03/2016   IR THORACENTESIS ASP PLEURAL SPACE W/IMG GUIDE 01/03/2016 [facility]  . PTCA  01/2008   Social History   Occupational History  . RETIRED Retired   Social History Main Topics  . Smoking status: Current Some Day Smoker    Packs/day: 0.00    Years: 15.00    Types: Cigarettes    Last attempt to quit: 09/01/1990  . Smokeless tobacco: Never Used     Comment: Patient smokes occasionally. Not everyday.  . Alcohol use No  . Drug use: No  . Sexual activity: No

## 2017-02-06 ENCOUNTER — Other Ambulatory Visit: Payer: Self-pay | Admitting: Family Medicine

## 2017-02-07 ENCOUNTER — Telehealth: Payer: Self-pay | Admitting: Family Medicine

## 2017-02-07 NOTE — Telephone Encounter (Signed)
Pt is calling to check on the status of his insulin prescription.  Pt states he took his last shot this morning. Pt still uses the [facility] on [address].  Please advise [phone]

## 2017-02-10 ENCOUNTER — Encounter: Payer: Self-pay | Admitting: *Deleted

## 2017-02-10 ENCOUNTER — Ambulatory Visit (INDEPENDENT_AMBULATORY_CARE_PROVIDER_SITE_OTHER): Payer: Medicare HMO | Admitting: Family Medicine

## 2017-02-10 DIAGNOSIS — Z23 Encounter for immunization: Secondary | ICD-10-CM | POA: Diagnosis not present

## 2017-02-10 NOTE — Telephone Encounter (Signed)
Patient has picked up prescription.

## 2017-02-13 ENCOUNTER — Ambulatory Visit: Payer: Medicare HMO | Admitting: Family Medicine

## 2017-02-20 DIAGNOSIS — G8929 Other chronic pain: Secondary | ICD-10-CM | POA: Diagnosis not present

## 2017-02-20 DIAGNOSIS — M545 Low back pain: Secondary | ICD-10-CM | POA: Diagnosis not present

## 2017-02-20 DIAGNOSIS — M47814 Spondylosis without myelopathy or radiculopathy, thoracic region: Secondary | ICD-10-CM | POA: Diagnosis not present

## 2017-02-20 DIAGNOSIS — Z79899 Other long term (current) drug therapy: Secondary | ICD-10-CM | POA: Diagnosis not present

## 2017-03-07 ENCOUNTER — Other Ambulatory Visit: Payer: Self-pay | Admitting: Family Medicine

## 2017-03-07 DIAGNOSIS — R0789 Other chest pain: Secondary | ICD-10-CM

## 2017-03-07 DIAGNOSIS — S2242XG Multiple fractures of ribs, left side, subsequent encounter for fracture with delayed healing: Secondary | ICD-10-CM

## 2017-03-07 NOTE — Telephone Encounter (Signed)
Pt called for refill hydrocodone acetaminophen Sent to [facility]

## 2017-03-07 NOTE — Telephone Encounter (Signed)
Copied from [place]. Topic: Quick Communication - See Telephone Encounter >> Mar 07, 2017 10:16 AM Cleaster Corin, NT wrote: CRM for notification. See Telephone encounter for:   03/07/17. Pt. Would like to have a refill on his hyrdrocodone-acetaminophen

## 2017-03-07 NOTE — Telephone Encounter (Signed)
I am ok with refill, but when is he out?  I will not be back in office until Monday, so if refill needed prior may need temporary refill by other provider - Loree Fee McVey is covering for me until Monday. If Monday is ok, I can Rx and sign then.

## 2017-03-10 MED ORDER — HYDROCODONE-ACETAMINOPHEN 5-325 MG PO TABS: 1.0000 | ORAL_TABLET | Freq: Four times a day (QID) | ORAL | 0 refills | Status: DC | PRN

## 2017-03-10 NOTE — Telephone Encounter (Signed)
Refill for controlled substance

## 2017-03-19 DIAGNOSIS — M1712 Unilateral primary osteoarthritis, left knee: Secondary | ICD-10-CM | POA: Diagnosis not present

## 2017-03-19 DIAGNOSIS — M170 Bilateral primary osteoarthritis of knee: Secondary | ICD-10-CM | POA: Diagnosis not present

## 2017-03-19 DIAGNOSIS — M1711 Unilateral primary osteoarthritis, right knee: Secondary | ICD-10-CM | POA: Diagnosis not present

## 2017-04-09 ENCOUNTER — Telehealth: Payer: Self-pay | Admitting: Family Medicine

## 2017-04-09 NOTE — Telephone Encounter (Signed)
Error

## 2017-04-10 ENCOUNTER — Encounter: Payer: Self-pay | Admitting: Family Medicine

## 2017-04-10 ENCOUNTER — Other Ambulatory Visit: Payer: Self-pay | Admitting: Family Medicine

## 2017-04-10 ENCOUNTER — Ambulatory Visit (INDEPENDENT_AMBULATORY_CARE_PROVIDER_SITE_OTHER): Payer: Medicare HMO | Admitting: Family Medicine

## 2017-04-10 ENCOUNTER — Other Ambulatory Visit: Payer: Self-pay

## 2017-04-10 VITALS — BP 136/78 | HR 68 | Temp 98.2°F | Resp 18 | Ht 70.0 in | Wt 222.0 lb

## 2017-04-10 DIAGNOSIS — Z720 Tobacco use: Secondary | ICD-10-CM | POA: Diagnosis not present

## 2017-04-10 DIAGNOSIS — R0789 Other chest pain: Secondary | ICD-10-CM | POA: Diagnosis not present

## 2017-04-10 DIAGNOSIS — I10 Essential (primary) hypertension: Secondary | ICD-10-CM

## 2017-04-10 DIAGNOSIS — Z794 Long term (current) use of insulin: Secondary | ICD-10-CM

## 2017-04-10 DIAGNOSIS — E785 Hyperlipidemia, unspecified: Secondary | ICD-10-CM

## 2017-04-10 DIAGNOSIS — M545 Low back pain, unspecified: Secondary | ICD-10-CM

## 2017-04-10 DIAGNOSIS — E119 Type 2 diabetes mellitus without complications: Secondary | ICD-10-CM | POA: Diagnosis not present

## 2017-04-10 DIAGNOSIS — E1122 Type 2 diabetes mellitus with diabetic chronic kidney disease: Secondary | ICD-10-CM

## 2017-04-10 DIAGNOSIS — S2242XG Multiple fractures of ribs, left side, subsequent encounter for fracture with delayed healing: Secondary | ICD-10-CM | POA: Diagnosis not present

## 2017-04-10 DIAGNOSIS — R0781 Pleurodynia: Secondary | ICD-10-CM | POA: Diagnosis not present

## 2017-04-10 MED ORDER — INSULIN NPH ISOPHANE & REGULAR (70-30) 100 UNIT/ML ~~LOC~~ SUSP: 8.0000 [IU] | Freq: Two times a day (BID) | SUBCUTANEOUS | 2 refills | Status: DC

## 2017-04-10 MED ORDER — LISINOPRIL-HYDROCHLOROTHIAZIDE 20-25 MG PO TABS: 1.0000 | ORAL_TABLET | Freq: Every day | ORAL | 1 refills | Status: DC

## 2017-04-10 MED ORDER — VARENICLINE TARTRATE 1 MG PO TABS: 1.0000 mg | ORAL_TABLET | Freq: Two times a day (BID) | ORAL | 1 refills | Status: DC

## 2017-04-10 MED ORDER — ATORVASTATIN CALCIUM 10 MG PO TABS: 10.0000 mg | ORAL_TABLET | Freq: Every day | ORAL | 1 refills | Status: DC

## 2017-04-10 MED ORDER — METFORMIN HCL 1000 MG PO TABS: 1000.0000 mg | ORAL_TABLET | Freq: Two times a day (BID) | ORAL | 1 refills | Status: DC

## 2017-04-10 MED ORDER — HYDROCODONE-ACETAMINOPHEN 5-325 MG PO TABS: 1.0000 | ORAL_TABLET | Freq: Four times a day (QID) | ORAL | 0 refills | Status: DC | PRN

## 2017-04-10 MED ORDER — LISINOPRIL 20 MG PO TABS: 20.0000 mg | ORAL_TABLET | Freq: Every day | ORAL | 2 refills | Status: DC

## 2017-04-10 MED ORDER — VARENICLINE TARTRATE 0.5 MG X 11 & 1 MG X 42 PO MISC: ORAL | 0 refills | Status: DC

## 2017-04-10 MED ORDER — METOPROLOL TARTRATE 50 MG PO TABS: 50.0000 mg | ORAL_TABLET | Freq: Two times a day (BID) | ORAL | 1 refills | Status: DC

## 2017-04-10 NOTE — Progress Notes (Signed)
Subjective:  By signing my name below, I, [name], attest that this documentation has been prepared under the direction and in the presence of Robert Ray, MD. Electronically Signed: [name], [place]. 04/10/2017 , 12:36 PM .  Patient was seen in Room 12 .   Patient ID: [name], male    DOB: [birth year], 84 y.o.   MRN: [id number] Chief Complaint  Patient presents with  . Diabetes    3 month follow up and medication refill   HPI [name] is a 84 y.o. male Here for follow up.   He mentions that he's going to [place] for 2 weeks to help take care of his wife's aunt, who is requiring major surgery.   Left sided rib pain He has a history of left sided rib pain with remote fracture of multiple ribs after a fall in 2017. He was continued on hydrocodone 2 per day, which was also treating his low back pain with DDD of L4-5, and L5-S1. He was referred to pain management in Oct; plan for follow up appointment for UDS prior to medication. He had an appointment with Dr. Erlinda Hong in Oct 24th, which indicated slowly healing rib fractures that were healing on rib xray. PT was declined. He was started on Flector patch. Last prescription of hydrocodone was on Dec 3rd for #60.   He states he was seen by pain management a month after his last visit. However, he declined medications from them, because patient still thought I was managing his hydrocodone. He received injections in his knees 3 weeks ago. He reports his back and ribs are still hurting. He is still taking hydrocodone 2 pills a day. He tried cutting back to just 1 pill a day, with a tylenol. He denies taking any NSAIDs. He denies weakness in his lower extremities, or any incontinence.   Diabetes Lab Results  Component Value Date   HGBA1C 7.3 (H) 01/24/2017   Lab Results  Component Value Date   MICROALBUR 83.3 05/17/2015   He's taking metformin 1083m BID and 70-30 insulin 8 units BID. He was continued on same dose of  insulin as his A1C had improved from 8.1.   Vision: He made an [place]    Elevated creatinine Lab Results  Component Value Date   CREATININE 1.54 (H) 01/24/2017   Range from 2017 to 2018 was 1.27 to 1.59.   Tobacco abuse He was still smoking at last visit in Oct, with 1 pack per 3 days. He states smoking relaxes his mind. He wants to try a prescription to assist his cessation. He was able to quit for 23 years in the past.   Hyperlipidemia Lab Results  Component Value Date   CHOL 129 01/24/2017   HDL 43 01/24/2017   LDLCALC 59 01/24/2017   TRIG 133 01/24/2017   CHOLHDL 3.0 01/24/2017   Lab Results  Component Value Date   ALT 13 01/15/2017   AST 15 01/15/2017   ALKPHOS 41 01/15/2017   BILITOT 0.3 01/15/2017    CAD + Cardiac He has a history of CAD and ventricular tachycardia (remote, no recent symptoms). His cardiologist is Dr. BGwenlyn Foundwith last office visit on Oct 3rd; plan for 1 year follow up. He had LAD stent on Oct 2009.   History of prostate cancer He is followed by [facility].   HTN He was on same medications at cardiology visit which includes lisinopril-HCTZ 20-247mQD, and metoprolol 503m  BID, as well as additional lisinopril 31m QD.   Patient Active Problem List   Diagnosis Date Noted  . Rib pain on left side 07/01/2016  . Fall 12/14/2015  . Elevated serum creatinine 12/14/2015  . Ileus ([facility] 12/14/2015  . Multiple fractures of ribs of left side 12/12/2015  . Acosta in the urine 12/01/2013  . Cystitis, radiation 12/01/2013  . Malignant neoplasm of prostate ([facility] 11/26/2013  . Pulsatile tinnitus 11/19/2013  . Rectal bleeding 12/23/2012  . Type II or unspecified type diabetes mellitus without mention of complication, not stated as uncontrolled   . Essential hypertension, benign   . Chronic back pain   . ED (erectile dysfunction)   . Anemia   . Hyperlipidemia   . CAD, NATIVE VESSEL 05/08/2008  .  VENTRICULAR TACHYCARDIA 05/08/2008  . PREMATURE VENTRICULAR CONTRACTIONS 05/08/2008   Past Medical History:  Diagnosis Date  . Allergy   . CAD (coronary artery disease)    pci to LAD 10/09; stable CAD by cath 05/31/08; myoview 04/11/11- no ishcemia, EF 67%; echo 05/15/09- EF>55%, mod calcification of the aortic valve leaflets  . Chronic back pain   . ED (erectile dysfunction)   . Essential hypertension, benign   . Hyperlipidemia   . Multiple rib fractures    left 9th, 10th, and 11th posterior rib fractures S/P fall 12/09/2015/notes 12/12/2015  . Prostate cancer ([facility]    s/p  . Type II or unspecified type diabetes mellitus without mention of complication, not stated as uncontrolled   . Ventricular tachycardia ([facility]    resuscitated; monitor 05/2008; attempted T ablation 2/10- aborted due to inappropriate substrate   Past Surgical History:  Procedure Laterality Date  . CARDIAC CATHETERIZATION  05/31/08   EF 55-60%, stable lesions, medical therapy  . COLECTOMY  '80's   polyps  . CORONARY ANGIOPLASTY WITH STENT PLACEMENT  01/19/08   PCI stent to mid LAD with Promus DES 27.5x28  . IR GENERIC HISTORICAL  01/03/2016   IR THORACENTESIS ASP PLEURAL SPACE W/IMG GUIDE 01/03/2016 [facility]  . PTCA  01/2008   Allergies  Allergen Reactions  . Contrast Media [Iodinated Diagnostic Agents] Swelling    Possible neck swelling after contrast for chest CT 03/2016. No hives, throat or other symptoms   Prior to Admission medications   Medication Sig Start Date End Date Taking? Authorizing Provider  atorvastatin (LIPITOR) 10 MG tablet TAKE 1 TABLET (10 MG TOTAL) BY MOUTH DAILY. 12/19/16  Yes GWendie Agreste MD  Acosta Glucose Monitoring Suppl (ACCU-CHEK AVIVA PLUS) w/Device KIT Test Acosta sugar 3 times daily. Dx code: E11.22 04/03/16  Yes GWendie Agreste MD  busPIRone (BUSPAR) 5 MG tablet Take 0.5-2 tablets (2.5-10 mg total) by mouth 3 (three) times daily. 07/24/16  Yes CTereasa Coop PA-C  diclofenac  (FLECTOR) 1.3 % PTCH Place 1 patch onto the skin 2 (two) times daily. 01/29/17  Yes XLeandrew Koyanagi MD  ferrous sulfate 325 (65 FE) MG tablet Take 325 mg by mouth daily with breakfast.   Yes [provider]  GAVILYTE-G 236 g solution  05/24/16  Yes [provider]  glucose Acosta (ACCU-CHEK AVIVA PLUS) test strip Test Acosta sugar 3 times daily. Dx code: E11.22 04/03/16  Yes GWendie Agreste MD  HYDROcodone-acetaminophen (NORCO/VICODIN) 5-325 MG tablet Take 1 tablet by mouth every 6 (six) hours as needed for moderate pain. 03/10/17  Yes GWendie Agreste MD  insulin NPH-regular Human (NOVOLIN 70/30) (70-30) 100 UNIT/ML injection Inject 7 Units into the  skin 2 (two) times daily with a meal. 06/13/16  Yes [name], MD  Insulin Syringe-Needle U-100 (B-D INS SYR HALF-UNIT .3CC/31G) 31G X 5/16" 0.3 ML MISC Use daily at bedtime to inject insulin. Dx code: 250.00 06/21/13  Yes [name], MD  Lancets (ACCU-CHEK MULTICLIX) lancets Test Acosta sugar 3 times daily. Dx code: E11.22 04/03/16  Yes [name], MD  lisinopril (PRINIVIL,ZESTRIL) 20 MG tablet TAKE 1 TABLET EVERY DAY 08/13/16  Yes [name], MD  lisinopril-hydrochlorothiazide (PRINZIDE,ZESTORETIC) 20-25 MG tablet TAKE 1 TABLET EVERY DAY 12/19/16  Yes [name], MD  metFORMIN (GLUCOPHAGE) 1000 MG tablet TAKE 1 TABLET TWICE DAILY WITH MEALS 12/19/16  Yes [name], MD  metoprolol tartrate (LOPRESSOR) 50 MG tablet TAKE 1 TABLET TWICE DAILY 12/19/16  Yes [name], MD  Multiple Vitamin (MULTIVITAMIN) tablet Take 1 tablet by mouth daily.   Yes [provider]  NOVOLIN 70/30 (70-30) 100 UNIT/ML injection INJECT 6 UNITS UNDER THE SKIN TWICE DAILY 02/06/17  Yes [name], MD  Omega-3 Fatty Acids (FISH OIL) 1000 MG CAPS Take 2 capsules by mouth daily.    Yes [provider]  sildenafil (VIAGRA) 50 MG tablet Take 1 tablet (50 mg total) by mouth daily as needed for erectile  dysfunction. 02/08/15  Yes Lorretta Harp, MD  Vitamin D, Cholecalciferol, 1000 UNITS TABS Take 1 tablet by mouth daily.    Yes [provider]  VOLTAREN 1 % GEL  06/08/16  Yes [provider]   Social History   Socioeconomic History  . Marital status: Legally Separated    Spouse name: Not on file  . Number of children: 7  . Years of education: Not on file  . Highest education level: Not on file  Social Needs  . Financial resource strain: Not on file  . Food insecurity - worry: Not on file  . Food insecurity - inability: Not on file  . Transportation needs - medical: Not on file  . Transportation needs - non-medical: Not on file  Occupational History  . Occupation: RETIRED    Employer: RETIRED  Tobacco Use  . Smoking status: Current Some Day Smoker    Packs/day: 0.00    Years: 15.00    Pack years: 0.00    Types: Cigarettes    Last attempt to quit: 09/01/1990    Years since quitting: 26.6  . Smokeless tobacco: Never Used  . Tobacco comment: Patient smokes occasionally. Not everyday.  Substance and Sexual Activity  . Alcohol use: No  . Drug use: No  . Sexual activity: No  Other Topics Concern  . Not on file  Social History Narrative   29 grandchildren. Single. Education: 12 th grade. Exercise: 3-4 times a week for 30 minutes working out and set ups.   Review of Systems  Constitutional: Negative for fatigue and unexpected weight change.  Eyes: Negative for visual disturbance.  Respiratory: Negative for cough, chest tightness and shortness of breath.   Cardiovascular: Negative for chest pain, palpitations and leg swelling.  Gastrointestinal: Negative for abdominal pain and Acosta in stool.  Musculoskeletal: Positive for back pain.       Rib pain  Neurological: Negative for dizziness, weakness, light-headedness, numbness and headaches.       Objective:   Physical Exam  Constitutional: He is oriented to person, place, and time. He appears well-developed  and well-nourished.  HENT:  Head: Normocephalic and atraumatic.  Eyes: EOM are normal. Pupils  are equal, round, and reactive to light.  Neck: No JVD present. Carotid bruit is not present.  Cardiovascular: Normal rate, regular rhythm and normal heart sounds.  No murmur heard. Pulmonary/Chest: Effort normal and breath sounds normal. He has no rales.  Musculoskeletal: He exhibits no edema.  Tender along left rib margin, and lower lumbar spine  Neurological: He is alert and oriented to person, place, and time.  Negative seated straight leg raise  Skin: Skin is warm and dry.  Psychiatric: He has a normal mood and affect.  Vitals reviewed.   Vitals:   04/10/17 1131  BP: 136/78  Pulse: 68  Resp: 18  Temp: 98.2 F (36.8 C)  TempSrc: Oral  SpO2: 100%  Weight: 222 lb (100.7 kg)  Height: 5' 10"  (1.778 m)      Assessment & Plan:    DORRELL MITCHELTREE is a 84 y.o. male Type 2 diabetes mellitus with chronic kidney disease, with long-term current use of insulin, unspecified CKD stage ([place]) - Plan: Basic metabolic panel, Hemoglobin A1c Type 2 diabetes mellitus without complication, with long-term current use of insulin ([facility]) - Plan: metFORMIN (GLUCOPHAGE) 1000 MG tablet  - continue same dose insulin, metformin for now, plan for labs in 1 month.  optho follow up as planned.   Tobacco abuse  - cessation discussed, plans to try Chantix, potential side effects discussed. Resources on handout.   Rib pain Low back pain without sciatica, unspecified back pain laterality, unspecified chronicity Left-sided chest wall pain - Plan: HYDROcodone-acetaminophen (NORCO/VICODIN) 5-325 MG tablet Closed fracture of multiple ribs of left side with delayed healing, subsequent encounter - Plan: HYDROcodone-acetaminophen (NORCO/VICODIN) 5-325 MG tablet  - improving, but still requiring hydrocodone approx 2 per day. Follow up with pain mgt for meds, but Rx written once as misunderstanding about Rx prior. If  tapering dose to only prn use, or not daily need, could consider refilling here.    Essential hypertension - Plan: Basic metabolic panel, metoprolol tartrate (LOPRESSOR) 50 MG tablet, lisinopril-hydrochlorothiazide (PRINZIDE,ZESTORETIC) 20-25 MG tablet  - overall stable, no med changes for now. Labs in 1 month.  Hyperlipidemia, unspecified hyperlipidemia type - Plan: atorvastatin (LIPITOR) 10 MG tablet  - tolerating lipitor - continue same dose.   Meds ordered this encounter  Medications  . varenicline (CHANTIX STARTING MONTH PAK) 0.5 MG X 11 & 1 MG X 42 tablet    Sig: Take one 0.5 mg tablet by mouth once daily for 3 days, then increase to one 0.5 mg tablet twice daily for 4 days, then increase to one 1 mg tablet twice daily.    Dispense:  53 tablet    Refill:  0  . varenicline (CHANTIX CONTINUING MONTH PAK) 1 MG tablet    Sig: Take 1 tablet (1 mg total) by mouth 2 (two) times daily.    Dispense:  60 tablet    Refill:  1  . HYDROcodone-acetaminophen (NORCO/VICODIN) 5-325 MG tablet    Sig: Take 1 tablet by mouth every 6 (six) hours as needed for moderate pain.    Dispense:  60 tablet    Refill:  0  . insulin NPH-regular Human (NOVOLIN 70/30) (70-30) 100 UNIT/ML injection    Sig: Inject 8 Units into the skin 2 (two) times daily with a meal.    Dispense:  10 mL    Refill:  2  . metoprolol tartrate (LOPRESSOR) 50 MG tablet    Sig: Take 1 tablet (50 mg total) by mouth 2 (two) times daily.  Dispense:  180 tablet    Refill:  1  . metFORMIN (GLUCOPHAGE) 1000 MG tablet    Sig: Take 1 tablet (1,000 mg total) by mouth 2 (two) times daily with a meal.    Dispense:  180 tablet    Refill:  1  . lisinopril-hydrochlorothiazide (PRINZIDE,ZESTORETIC) 20-25 MG tablet    Sig: Take 1 tablet by mouth daily.    Dispense:  90 tablet    Refill:  1  . lisinopril (PRINIVIL,ZESTRIL) 20 MG tablet    Sig: Take 1 tablet (20 mg total) by mouth daily.    Dispense:  90 tablet    Refill:  2  . atorvastatin  (LIPITOR) 10 MG tablet    Sig: Take 1 tablet (10 mg total) by mouth daily.    Dispense:  90 tablet    Refill:  1   Patient Instructions   I did write for one more prescription of hydrocodone. Tylenol ok if needed in place of hydrocodone if milder pain. Follow up with pain management provider for further narcotic prescriptions, as you will need to be on a pain contract with regular use of that medicine. If you get to a point of not requiring that medicine daily, we can discuss if it is something I can prescribe here.   Continue same dose of metformin and insulin for now, but return for lab only visit in 1 month (you do NOT need to be fasting for that testing).     I will send in the Chantix prescription for quitting smoking. See information on quitting below.  1-800-QUITNOW   Follow up with eye specialist as planned and have them send me a report to screen for retinopathy.   Recheck with me in about 4 months for diabetes, cholesterol.   Steps to Quit Smoking Smoking tobacco can be bad for your health. It can also affect almost every organ in your body. Smoking puts you and people around you at risk for many serious long-lasting (chronic) diseases. Quitting smoking is hard, but it is one of the best things that you can do for your health. It is never too late to quit. What are the benefits of quitting smoking? When you quit smoking, you lower your risk for getting serious diseases and conditions. They can include:  Lung cancer or lung disease.  Heart disease.  Stroke.  Heart attack.  Not being able to have children (infertility).  Weak bones (osteoporosis) and broken bones (fractures).  If you have coughing, wheezing, and shortness of breath, those symptoms may get better when you quit. You may also get sick less often. If you are pregnant, quitting smoking can help to lower your chances of having a baby of low birth weight. What can I do to help me quit smoking? Talk with your  doctor about what can help you quit smoking. Some things you can do (strategies) include:  Quitting smoking totally, instead of slowly cutting back how much you smoke over a period of time.  Going to in-person counseling. You are more likely to quit if you go to many counseling sessions.  Using resources and support systems, such as: ? Database administrator with a Social worker. ? Phone quitlines. ? Careers information officer. ? Support groups or group counseling. ? Text messaging programs. ? Mobile phone apps or applications.  Taking medicines. Some of these medicines may have nicotine in them. If you are pregnant or breastfeeding, do not take any medicines to quit smoking unless your doctor says it  is okay. Talk with your doctor about counseling or other things that can help you.  Talk with your doctor about using more than one strategy at the same time, such as taking medicines while you are also going to in-person counseling. This can help make quitting easier. What things can I do to make it easier to quit? Quitting smoking might feel very hard at first, but there is a lot that you can do to make it easier. Take these steps:  Talk to your family and friends. Ask them to support and encourage you.  Call phone quitlines, reach out to support groups, or work with a Social worker.  Ask people who smoke to not smoke around you.  Avoid places that make you want (trigger) to smoke, such as: ? Bars. ? Parties. ? Smoke-break areas at work.  Spend time with people who do not smoke.  Lower the stress in your life. Stress can make you want to smoke. Try these things to help your stress: ? Getting regular exercise. ? Deep-breathing exercises. ? Yoga. ? Meditating. ? Doing a body scan. To do this, close your eyes, focus on one area of your body at a time from head to toe, and notice which parts of your body are tense. Try to relax the muscles in those areas.  Download or buy apps on your mobile phone  or tablet that can help you stick to your quit plan. There are many free apps, such as QuitGuide from the [employer] Office manager for Disease Control and Prevention). You can find more support from smokefree.gov and other websites.  This information is not intended to replace advice given to you by your health care provider. Make sure you discuss any questions you have with your health care provider. Document Released: 01/19/2009 Document Revised: 11/21/2015 Document Reviewed: 08/09/2014 Elsevier Interactive Patient Education  2018 [place] with Quitting Smoking Quitting smoking is a physical and mental challenge. You will face cravings, withdrawal symptoms, and temptation. Before quitting, work with your health care provider to make a plan that can help you cope. Preparation can help you quit and keep you from giving in. How can I cope with cravings? Cravings usually last for 5-10 minutes. If you get through it, the craving will pass. Consider taking the following actions to help you cope with cravings:  Keep your mouth busy: ? Chew sugar-free gum. ? Suck on hard candies or a straw. ? Brush your teeth.  Keep your hands and body busy: ? Immediately change to a different activity when you feel a craving. ? Squeeze or play with a ball. ? Do an activity or a hobby, like making bead jewelry, practicing needlepoint, or working with wood. ? Mix up your normal routine. ? Take a short exercise break. Go for a quick walk or run up and down stairs. ? Spend time in public places where smoking is not allowed.  Focus on doing something kind or helpful for someone else.  Call a friend or family member to talk during a craving.  Join a support group.  Call a quit line, such as 1-800-QUIT-NOW.  Talk with your health care provider about medicines that might help you cope with cravings and make quitting easier for you.  How can I deal with withdrawal symptoms? Your body may experience  negative effects as it tries to get used to not having nicotine in the system. These effects are called withdrawal symptoms. They may include:  Feeling hungrier  than normal.  Trouble concentrating.  Irritability.  Trouble sleeping.  Feeling depressed.  Restlessness and agitation.  Craving a cigarette.  To manage withdrawal symptoms:  Avoid places, people, and activities that trigger your cravings.  Remember why you want to quit.  Get plenty of sleep.  Avoid coffee and other caffeinated drinks. These may worsen some of your symptoms.  How can I handle social situations? Social situations can be difficult when you are quitting smoking, especially in the first few weeks. To manage this, you can:  Avoid parties, bars, and other social situations where people might be smoking.  Avoid alcohol.  Leave right away if you have the urge to smoke.  Explain to your family and friends that you are quitting smoking. Ask for understanding and support.  Plan activities with friends or family where smoking is not an option.  What are some ways I can cope with stress? Wanting to smoke may cause stress, and stress can make you want to smoke. Find ways to manage your stress. Relaxation techniques can help. For example:  Breathe slowly and deeply, in through your nose and out through your mouth.  Listen to soothing, relaxing music.  Talk with a family member or friend about your stress.  Light a candle.  Soak in a bath or take a shower.  Think about a peaceful place.  What are some ways I can prevent weight gain? Be aware that many people gain weight after they quit smoking. However, not everyone does. To keep from gaining weight, have a plan in place before you quit and stick to the plan after you quit. Your plan should include:  Having healthy snacks. When you have a craving, it may help to: ? Eat plain popcorn, crunchy carrots, celery, or other cut vegetables. ? Chew  sugar-free gum.  Changing how you eat: ? Eat small portion sizes at meals. ? Eat 4-6 small meals throughout the day instead of 1-2 large meals a day. ? Be mindful when you eat. Do not watch television or do other things that might distract you as you eat.  Exercising regularly: ? Make time to exercise each day. If you do not have time for a long workout, do short bouts of exercise for 5-10 minutes several times a day. ? Do some form of strengthening exercise, like weight lifting, and some form of aerobic exercise, like running or swimming.  Drinking plenty of water or other low-calorie or no-calorie drinks. Drink 6-8 glasses of water daily, or as much as instructed by your health care provider.  Summary  Quitting smoking is a physical and mental challenge. You will face cravings, withdrawal symptoms, and temptation to smoke again. Preparation can help you as you go through these challenges.  You can cope with cravings by keeping your mouth busy (such as by chewing gum), keeping your body and hands busy, and making calls to family, friends, or a helpline for people who want to quit smoking.  You can cope with withdrawal symptoms by avoiding places where people smoke, avoiding drinks with caffeine, and getting plenty of rest.  Ask your health care provider about the different ways to prevent weight gain, avoid stress, and handle social situations. This information is not intended to replace advice given to you by your health care provider. Make sure you discuss any questions you have with your health care provider. Document Released: 03/22/2016 Document Revised: 03/22/2016 Document Reviewed: 03/22/2016 Elsevier Interactive Patient Education  2018 [employer].   IF  you received an x-Acosta today, you will receive an invoice from [facility]. Please contact [facility] at [phone] with questions or concerns regarding your invoice.   IF you received labwork today, you  will receive an invoice from [facility]. Please contact [facility] at [phone] with questions or concerns regarding your invoice.   Our billing staff will not be able to assist you with questions regarding bills from these companies.  You will be contacted with the lab results as soon as they are available. The fastest way to get your results is to activate your My Chart account. Instructions are located on the last page of this paperwork. If you have not heard from [place] regarding the results in 2 weeks, please contact this office.      I personally performed the services described in this documentation, which was scribed in my presence. The recorded information has been reviewed and considered for accuracy and completeness, addended by me as needed, and agree with information above.  Signed,   Robert Ray, MD [facility].  04/11/17 9:37 PM

## 2017-04-10 NOTE — Patient Instructions (Addendum)
I did write for one more prescription of hydrocodone. Tylenol ok if needed in place of hydrocodone if milder pain. Follow up with pain management provider for further narcotic prescriptions, as you will need to be on a pain contract with regular use of that medicine. If you get to a point of not requiring that medicine daily, we can discuss if it is something I can prescribe here.   Continue same dose of metformin and insulin for now, but return for lab only visit in 1 month (you do NOT need to be fasting for that testing).     I will send in the Chantix prescription for quitting smoking. See information on quitting below.  1-800-QUITNOW   Follow up with eye specialist as planned and have them send me a report to screen for retinopathy.   Recheck with me in about 4 months for diabetes, cholesterol.   Steps to Quit Smoking Smoking tobacco can be bad for your health. It can also affect almost every organ in your body. Smoking puts you and people around you at risk for many serious long-lasting (chronic) diseases. Quitting smoking is hard, but it is one of the best things that you can do for your health. It is never too late to quit. What are the benefits of quitting smoking? When you quit smoking, you lower your risk for getting serious diseases and conditions. They can include:  Lung cancer or lung disease.  Heart disease.  Stroke.  Heart attack.  Not being able to have children (infertility).  Weak bones (osteoporosis) and broken bones (fractures).  If you have coughing, wheezing, and shortness of breath, those symptoms may get better when you quit. You may also get sick less often. If you are pregnant, quitting smoking can help to lower your chances of having a baby of low birth weight. What can I do to help me quit smoking? Talk with your doctor about what can help you quit smoking. Some things you can do (strategies) include:  Quitting smoking totally, instead of slowly cutting  back how much you smoke over a period of time.  Going to in-person counseling. You are more likely to quit if you go to many counseling sessions.  Using resources and support systems, such as: ? Database administrator with a Social worker. ? Phone quitlines. ? Careers information officer. ? Support groups or group counseling. ? Text messaging programs. ? Mobile phone apps or applications.  Taking medicines. Some of these medicines may have nicotine in them. If you are pregnant or breastfeeding, do not take any medicines to quit smoking unless your doctor says it is okay. Talk with your doctor about counseling or other things that can help you.  Talk with your doctor about using more than one strategy at the same time, such as taking medicines while you are also going to in-person counseling. This can help make quitting easier. What things can I do to make it easier to quit? Quitting smoking might feel very hard at first, but there is a lot that you can do to make it easier. Take these steps:  Talk to your family and friends. Ask them to support and encourage you.  Call phone quitlines, reach out to support groups, or work with a Social worker.  Ask people who smoke to not smoke around you.  Avoid places that make you want (trigger) to smoke, such as: ? Bars. ? Parties. ? Smoke-break areas at work.  Spend time with people who do not smoke.  Lower the stress in your life. Stress can make you want to smoke. Try these things to help your stress: ? Getting regular exercise. ? Deep-breathing exercises. ? Yoga. ? Meditating. ? Doing a body scan. To do this, close your eyes, focus on one area of your body at a time from head to toe, and notice which parts of your body are tense. Try to relax the muscles in those areas.  Download or buy apps on your mobile phone or tablet that can help you stick to your quit plan. There are many free apps, such as QuitGuide from the [employer] Office manager for Disease Control and  Prevention). You can find more support from smokefree.gov and other websites.  This information is not intended to replace advice given to you by your health care provider. Make sure you discuss any questions you have with your health care provider. Document Released: 01/19/2009 Document Revised: 11/21/2015 Document Reviewed: 08/09/2014 Elsevier Interactive Patient Education  2018 [place] with Quitting Smoking Quitting smoking is a physical and mental challenge. You will face cravings, withdrawal symptoms, and temptation. Before quitting, work with your health care provider to make a plan that can help you cope. Preparation can help you quit and keep you from giving in. How can I cope with cravings? Cravings usually last for 5-10 minutes. If you get through it, the craving will pass. Consider taking the following actions to help you cope with cravings:  Keep your mouth busy: ? Chew sugar-free gum. ? Suck on hard candies or a straw. ? Brush your teeth.  Keep your hands and body busy: ? Immediately change to a different activity when you feel a craving. ? Squeeze or play with a ball. ? Do an activity or a hobby, like making bead jewelry, practicing needlepoint, or working with wood. ? Mix up your normal routine. ? Take a short exercise break. Go for a quick walk or run up and down stairs. ? Spend time in public places where smoking is not allowed.  Focus on doing something kind or helpful for someone else.  Call a friend or family member to talk during a craving.  Join a support group.  Call a quit line, such as 1-800-QUIT-NOW.  Talk with your health care provider about medicines that might help you cope with cravings and make quitting easier for you.  How can I deal with withdrawal symptoms? Your body may experience negative effects as it tries to get used to not having nicotine in the system. These effects are called withdrawal symptoms. They may  include:  Feeling hungrier than normal.  Trouble concentrating.  Irritability.  Trouble sleeping.  Feeling depressed.  Restlessness and agitation.  Craving a cigarette.  To manage withdrawal symptoms:  Avoid places, people, and activities that trigger your cravings.  Remember why you want to quit.  Get plenty of sleep.  Avoid coffee and other caffeinated drinks. These may worsen some of your symptoms.  How can I handle social situations? Social situations can be difficult when you are quitting smoking, especially in the first few weeks. To manage this, you can:  Avoid parties, bars, and other social situations where people might be smoking.  Avoid alcohol.  Leave right away if you have the urge to smoke.  Explain to your family and friends that you are quitting smoking. Ask for understanding and support.  Plan activities with friends or family where smoking is not an option.  What are some ways  I can cope with stress? Wanting to smoke may cause stress, and stress can make you want to smoke. Find ways to manage your stress. Relaxation techniques can help. For example:  Breathe slowly and deeply, in through your nose and out through your mouth.  Listen to soothing, relaxing music.  Talk with a family member or friend about your stress.  Light a candle.  Soak in a bath or take a shower.  Think about a peaceful place.  What are some ways I can prevent weight gain? Be aware that many people gain weight after they quit smoking. However, not everyone does. To keep from gaining weight, have a plan in place before you quit and stick to the plan after you quit. Your plan should include:  Having healthy snacks. When you have a craving, it may help to: ? Eat plain popcorn, crunchy carrots, celery, or other cut vegetables. ? Chew sugar-free gum.  Changing how you eat: ? Eat small portion sizes at meals. ? Eat 4-6 small meals throughout the day instead of 1-2 large  meals a day. ? Be mindful when you eat. Do not watch television or do other things that might distract you as you eat.  Exercising regularly: ? Make time to exercise each day. If you do not have time for a long workout, do short bouts of exercise for 5-10 minutes several times a day. ? Do some form of strengthening exercise, like weight lifting, and some form of aerobic exercise, like running or swimming.  Drinking plenty of water or other low-calorie or no-calorie drinks. Drink 6-8 glasses of water daily, or as much as instructed by your health care provider.  Summary  Quitting smoking is a physical and mental challenge. You will face cravings, withdrawal symptoms, and temptation to smoke again. Preparation can help you as you go through these challenges.  You can cope with cravings by keeping your mouth busy (such as by chewing gum), keeping your body and hands busy, and making calls to family, friends, or a helpline for people who want to quit smoking.  You can cope with withdrawal symptoms by avoiding places where people smoke, avoiding drinks with caffeine, and getting plenty of rest.  Ask your health care provider about the different ways to prevent weight gain, avoid stress, and handle social situations. This information is not intended to replace advice given to you by your health care provider. Make sure you discuss any questions you have with your health care provider. Document Released: 03/22/2016 Document Revised: 03/22/2016 Document Reviewed: 03/22/2016 Elsevier Interactive Patient Education  2018 [employer].   IF you received an x-ray today, you will receive an invoice from [facility]. Please contact [facility] at [phone] with questions or concerns regarding your invoice.   IF you received labwork today, you will receive an invoice from [place]. Please contact [facility] at [phone] with questions or concerns regarding your invoice.   Our  billing staff will not be able to assist you with questions regarding bills from these companies.  You will be contacted with the lab results as soon as they are available. The fastest way to get your results is to activate your My Chart account. Instructions are located on the last page of this paperwork. If you have not heard from [place] regarding the results in 2 weeks, please contact this office.

## 2017-04-16 IMAGING — US US THORACENTESIS ASP PLEURAL SPACE W/IMG GUIDE
1 series · 6 of 6 positions shown · non-contrast
Comparison: none

INDICATION: Recent fall with 9 th - 11 th posterior rib fractures on the left.
Left pleural effusion. Request for therapeutic thoracentesis.

[Series 1: us thoracentesis asp pleural space w/img guide · 0.23mm/px · 6 of 6 slices shown]
[im 1/6]
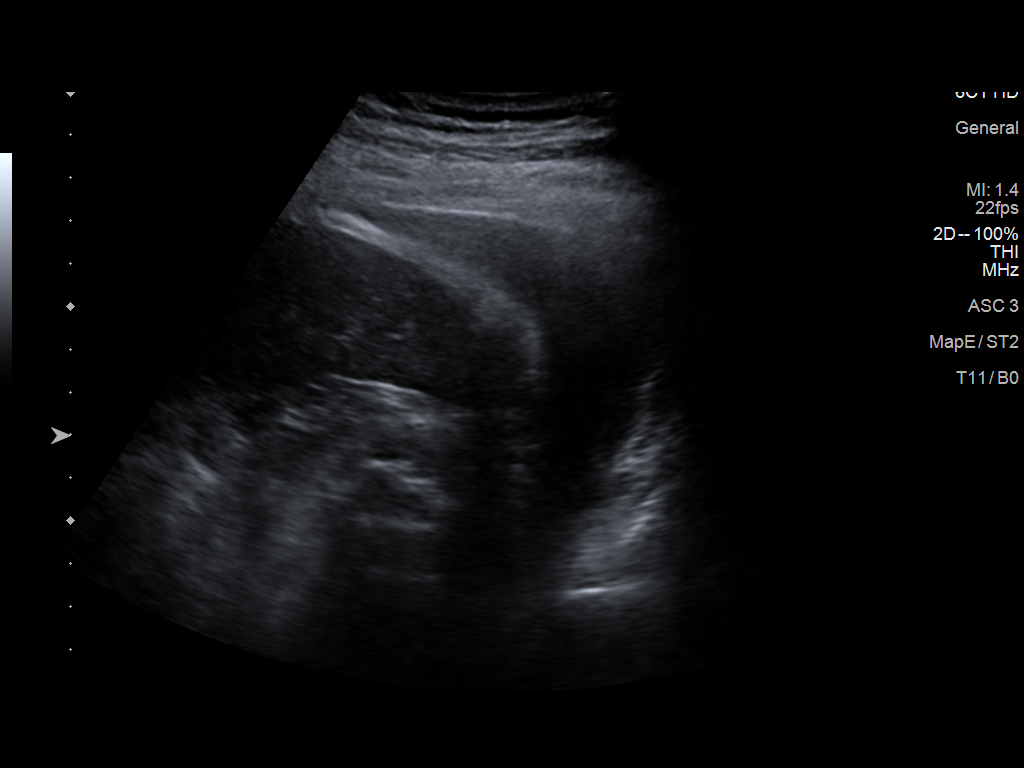
[im 2/6]
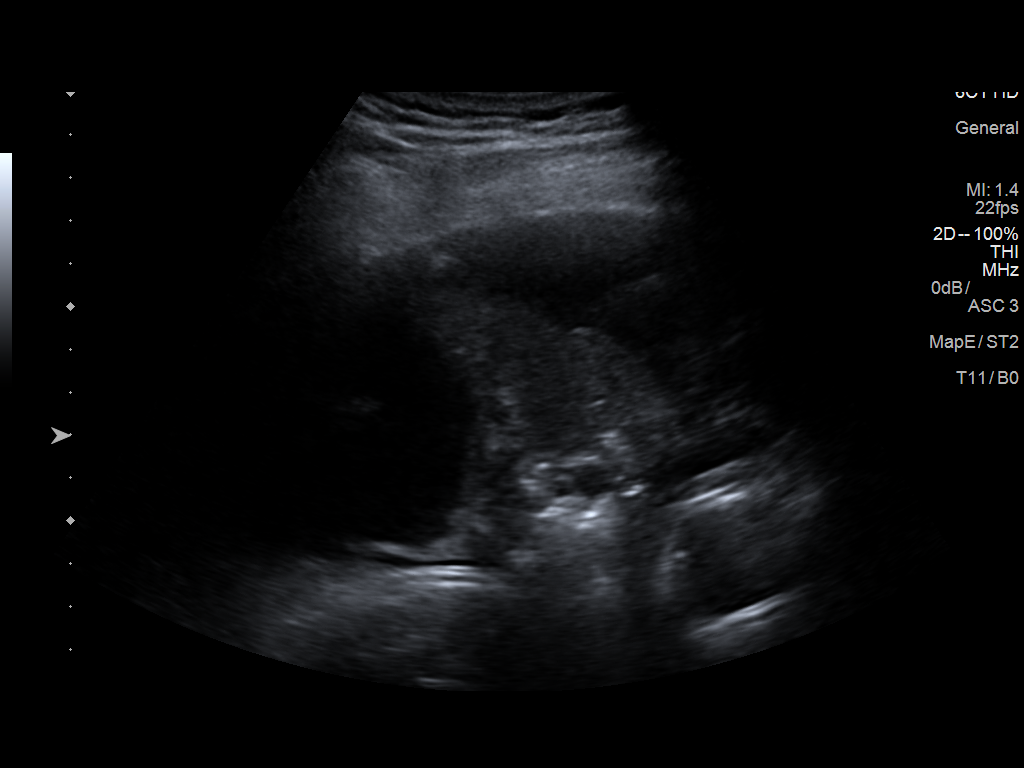
[im 3/6]
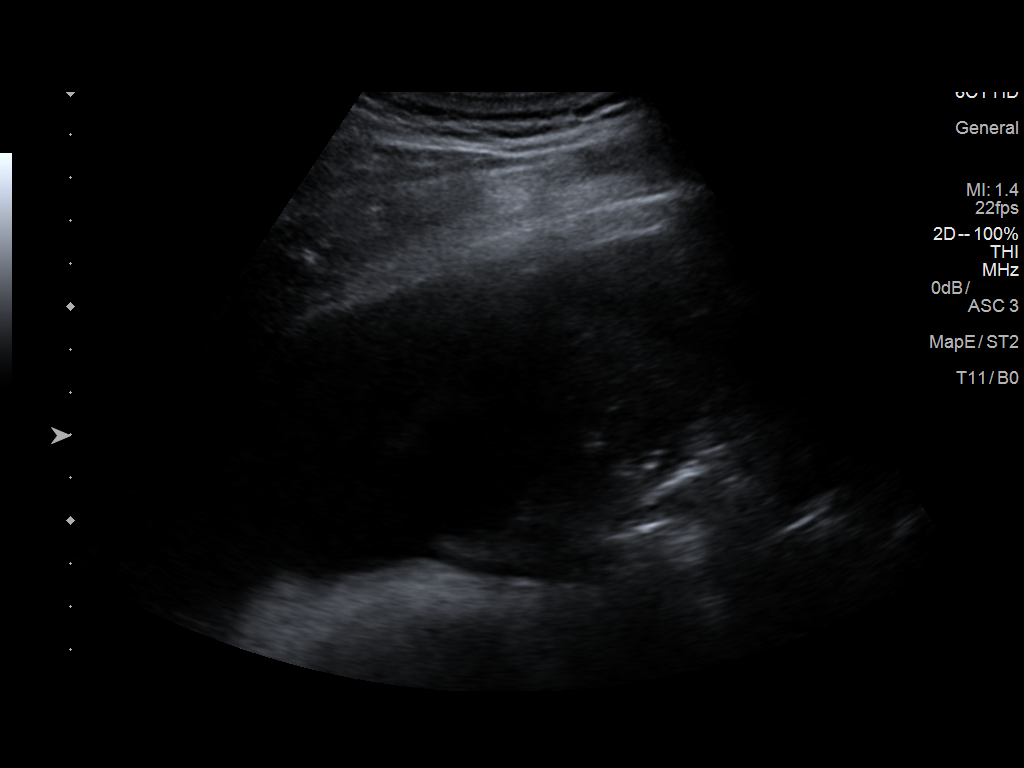
[im 4/6]
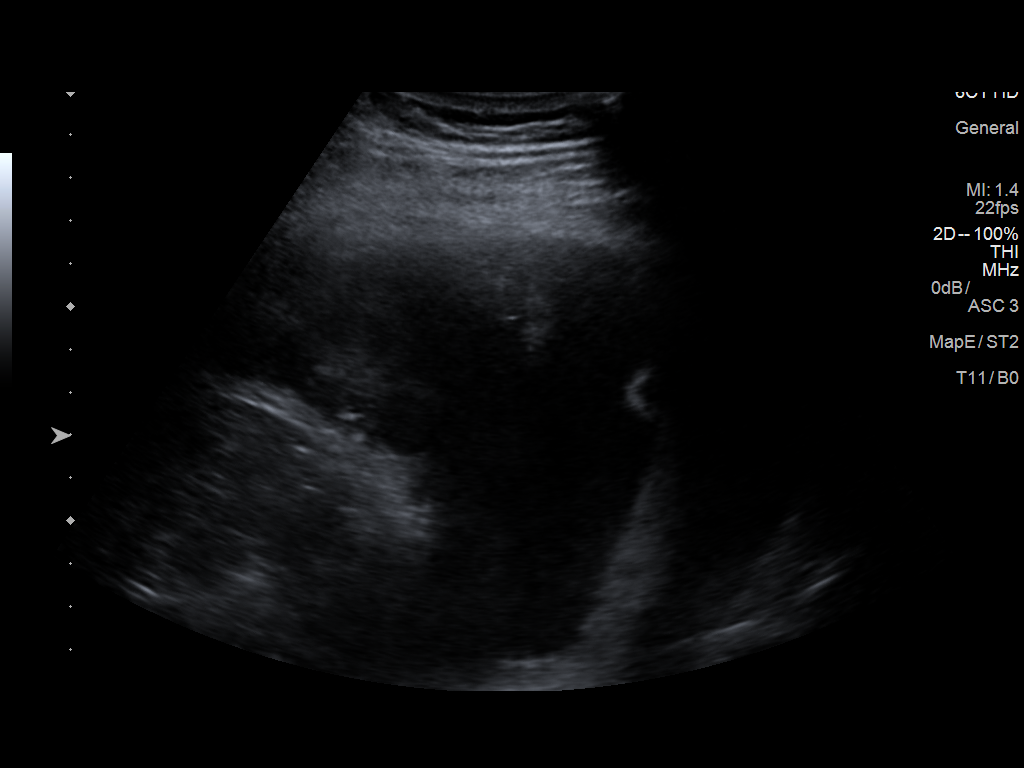
[im 5/6]
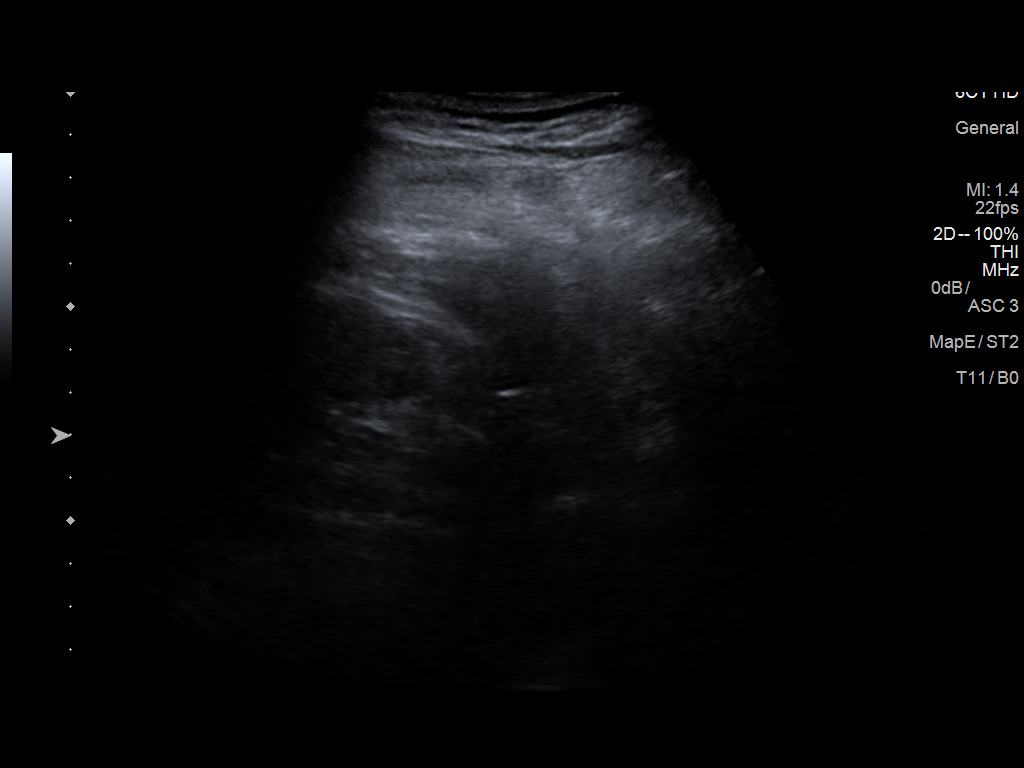
[im 6/6]
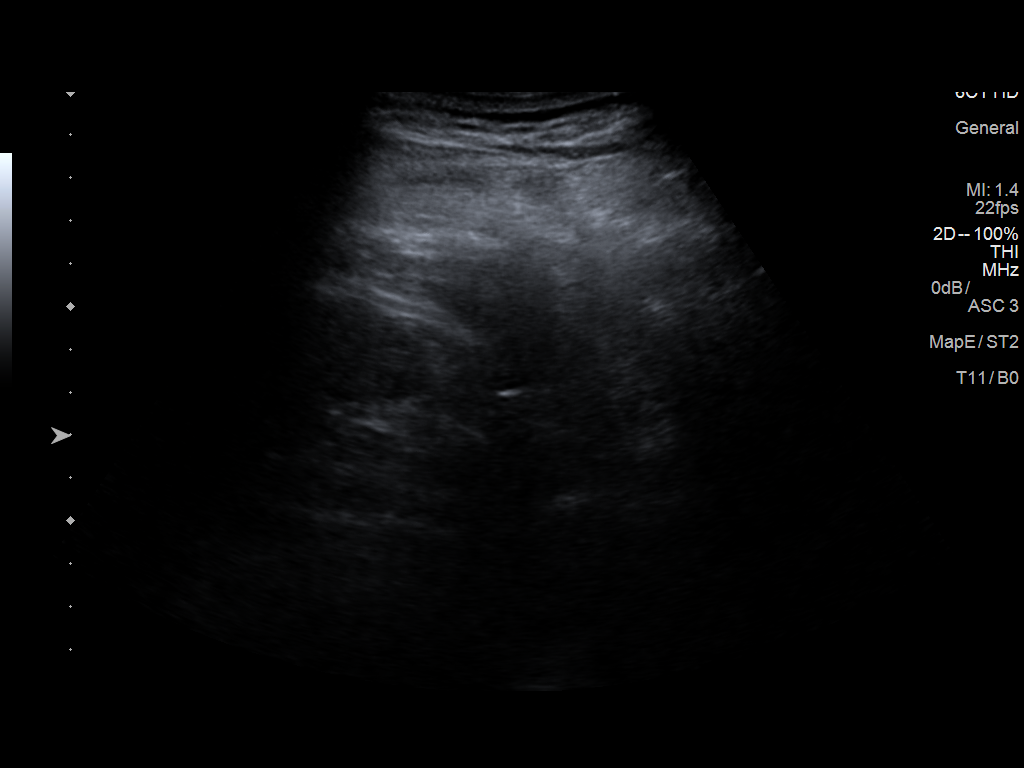

[6 of 6 positions shown; findings below may reference images not displayed]

EXAM:
ULTRASOUND GUIDED LEFT THORACENTESIS

MEDICATIONS:
1% Lidocaine.

COMPLICATIONS:
None immediate.

PROCEDURE:
An ultrasound guided thoracentesis was thoroughly discussed with the
patient and questions answered. The benefits, risks, alternatives
and complications were also discussed. The patient understands and
wishes to proceed with the procedure. Written consent was obtained.

Ultrasound was performed to localize and mark an adequate pocket of
fluid in the left chest. The area was then prepped and draped in the
normal sterile fashion. 1% Lidocaine was used for local anesthesia.
Under ultrasound guidance a 6 Fr Safe-T-Centesis catheter was
introduced. Thoracentesis was performed. The catheter was removed
and a dressing applied.
FINDINGS: A total of approximately 1 liter of dark bloody fluid was removed.
IMPRESSION: Successful ultrasound guided left thoracentesis yielding 1 liter of
dark bloody pleural fluid.

## 2017-04-29 ENCOUNTER — Ambulatory Visit: Payer: Medicare HMO | Admitting: Family Medicine

## 2017-04-29 ENCOUNTER — Ambulatory Visit: Payer: Medicare HMO | Admitting: Urgent Care

## 2017-04-29 DIAGNOSIS — I10 Essential (primary) hypertension: Secondary | ICD-10-CM | POA: Diagnosis not present

## 2017-04-29 DIAGNOSIS — Z794 Long term (current) use of insulin: Secondary | ICD-10-CM | POA: Diagnosis not present

## 2017-04-29 DIAGNOSIS — E1122 Type 2 diabetes mellitus with diabetic chronic kidney disease: Secondary | ICD-10-CM

## 2017-04-29 NOTE — Progress Notes (Signed)
Nurse visit only

## 2017-04-30 LAB — BASIC METABOLIC PANEL
BUN/Creatinine Ratio: 13 (ref 10–24)
BUN: 20 mg/dL (ref 8–27)
CO2: 23 mmol/L (ref 20–29)
Calcium: 9.2 mg/dL (ref 8.6–10.2)
Chloride: 105 mmol/L (ref 96–106)
Creatinine, Ser: 1.56 mg/dL — ABNORMAL HIGH (ref 0.76–1.27)
GFR calc Af Amer: 47 mL/min/{1.73_m2} — ABNORMAL LOW (ref >59)
GFR calc non Af Amer: 41 mL/min/{1.73_m2} — ABNORMAL LOW (ref >59)
Glucose: 112 mg/dL — ABNORMAL HIGH (ref 65–99)
Potassium: 5 mmol/L (ref 3.5–5.2)
Sodium: 140 mmol/L (ref 134–144)

## 2017-05-11 ENCOUNTER — Other Ambulatory Visit: Payer: Self-pay | Admitting: Family Medicine

## 2017-05-11 DIAGNOSIS — E119 Type 2 diabetes mellitus without complications: Secondary | ICD-10-CM

## 2017-05-11 DIAGNOSIS — Z794 Long term (current) use of insulin: Secondary | ICD-10-CM

## 2017-05-11 DIAGNOSIS — E785 Hyperlipidemia, unspecified: Secondary | ICD-10-CM

## 2017-05-11 DIAGNOSIS — I10 Essential (primary) hypertension: Secondary | ICD-10-CM

## 2017-05-12 ENCOUNTER — Encounter: Payer: Self-pay | Admitting: *Deleted

## 2017-05-15 ENCOUNTER — Ambulatory Visit: Payer: Medicare HMO | Admitting: Podiatry

## 2017-05-15 ENCOUNTER — Ambulatory Visit (INDEPENDENT_AMBULATORY_CARE_PROVIDER_SITE_OTHER): Payer: Medicare HMO

## 2017-05-15 ENCOUNTER — Encounter: Payer: Self-pay | Admitting: Podiatry

## 2017-05-15 VITALS — BP 180/85 | HR 65 | Resp 16

## 2017-05-15 DIAGNOSIS — M779 Enthesopathy, unspecified: Secondary | ICD-10-CM | POA: Diagnosis not present

## 2017-05-15 DIAGNOSIS — M2041 Other hammer toe(s) (acquired), right foot: Secondary | ICD-10-CM

## 2017-05-15 DIAGNOSIS — L84 Corns and callosities: Secondary | ICD-10-CM | POA: Diagnosis not present

## 2017-05-15 DIAGNOSIS — L600 Ingrowing nail: Secondary | ICD-10-CM

## 2017-05-15 MED ORDER — TRIAMCINOLONE ACETONIDE 10 MG/ML IJ SUSP
10.0000 mg | Freq: Once | INTRAMUSCULAR | Status: AC
  Administered 2017-05-15: 10 mg

## 2017-05-15 NOTE — Progress Notes (Signed)
   Subjective:    Patient ID: [name], male    DOB: 02-11-1934, 84 y.o.   MRN: [id number]  HPI    Review of Systems  HENT: Positive for tinnitus.   Musculoskeletal: Positive for arthralgias.  All other systems reviewed and are negative.      Objective:   Physical Exam        Assessment & Plan:

## 2017-05-15 NOTE — Patient Instructions (Signed)

## 2017-05-15 NOTE — Progress Notes (Signed)
Subjective:   Patient ID: [name], male   DOB: 84 y.o.   MRN: [id number]   HPI Patient presents with a chronic painful lesion fifth digit right that he is tried to pad in the past and soak an ingrown toenail second digit right that is painful.  Patient states that his sugars been under good control and his A1c generally is below 7 was 7.3 at last visit.  Patient states is generally around 100 when he checks it.  Patient does not smoke and likes to be active   Review of Systems  All other systems reviewed and are negative.       Objective:  Physical Exam  Constitutional: He appears well-developed and well-nourished.  Cardiovascular: Intact distal pulses.  Pulmonary/Chest: Effort normal.  Musculoskeletal: Normal range of motion.  Neurological: He is alert.  Skin: Skin is warm.  Nursing note and vitals reviewed.   Neurovascular status intact muscle strength adequate range of motion within normal limits with patient noted to have a painful fifth digit right with inflammation around the head of the fifth digit with fluid buildup and an incurvated right second nail medial border that is painful when pressed and make shoe gear difficult.  Patient is noted to have good digital perfusion and is well oriented x3     Assessment:  Hammertoe deformity with inflammatory capsulitis digit 5 right with ingrown toenail deformity right second toe medial border with pain and no redness or drainage     Plan:  H&P x-rays reviewed conditions discussed at great length.  We are going to try to utilize conservative treatment at this time and I went ahead and discussed ingrown toenail removal explained procedure and risk.  Patient wants surgery and I infiltrated the right second toe 60 mg like Marcaine mixture did sterile prep of the toe and then using sterile instrumentation remove the medial border exposed matrix and applied phenol 3 applications 30 seconds followed by alcohol lavage and sterile  dressing.  Given instructions on soaks and will reappoint in today for the fifth digit I anesthetized I injected the interphalangeal joint 1 mg dexamethasone 1 mg Kenalog and I debrided lesion applied padding.  Patient will be seen back for [place] to recheck again as needed and may require digital arthroplasty procedure  X-rays indicate there is enlargement had a proximal phalanx digit 5 right

## 2017-05-20 ENCOUNTER — Other Ambulatory Visit: Payer: Self-pay | Admitting: Family Medicine

## 2017-05-20 NOTE — Telephone Encounter (Signed)
Pt requesting med to be sent to [facility]. Had been previously sent to [facility]. Called pt to verify.  lisinopril refill Last OV: 04/10/17 Last Refill:04/10/17 Pharmacy:[facility]

## 2017-05-26 ENCOUNTER — Telehealth: Payer: Self-pay | Admitting: *Deleted

## 2017-05-26 NOTE — Telephone Encounter (Signed)
Left message informing pt if the tip of his toe was black, we needed to see him as soon as possible, to call back and make an appt [phone], and I would call again.

## 2017-05-26 NOTE — Telephone Encounter (Signed)
I spoke with pt and he states the darkness is not a the tip, but at the edge of the nail. I asked pt if the darkness is a scab and pt states no. I asked if when he touches his toe it is the same temperature as the other toes and he said yes. I asked if when he presses his toe does it lighten then come back to color quickly and he stated no it still stays dark. I told pt I would like him to come in to see the nurse tomorrow, pt agreed and I transferred pt to schedulers.

## 2017-05-26 NOTE — Telephone Encounter (Signed)
Pt states he had an ingrown toenail procedure about 2 weeks ago and 1/2 of the tip of the toe is black.

## 2017-05-27 ENCOUNTER — Ambulatory Visit (INDEPENDENT_AMBULATORY_CARE_PROVIDER_SITE_OTHER): Payer: Medicare HMO

## 2017-05-27 DIAGNOSIS — L600 Ingrowing nail: Secondary | ICD-10-CM

## 2017-05-30 NOTE — Progress Notes (Signed)
Mr. Pola presents today with a complaint of toe tenderness and discoloration post partial nail avulsion 2nd toe right foot, procedure done 05/15/17.   Noted well healing surgical site, mild blistering from phenol reaction to area. No redness, swelling or drainage noted.    Cleansed area with sterile normal saline. Advised to watch for s/s of infection, leave bandage off at night and on during the day, apply neosporin when bandaging.   Follow up as needed or with any acute symptom changes. Patient was also evaluated by Dr Cannon Kettle who agreed with assessment and plan of care.

## 2017-07-02 DIAGNOSIS — H5203 Hypermetropia, bilateral: Secondary | ICD-10-CM | POA: Diagnosis not present

## 2017-07-10 ENCOUNTER — Other Ambulatory Visit: Payer: Self-pay | Admitting: Family Medicine

## 2017-07-10 DIAGNOSIS — E119 Type 2 diabetes mellitus without complications: Secondary | ICD-10-CM

## 2017-07-10 DIAGNOSIS — I10 Essential (primary) hypertension: Secondary | ICD-10-CM

## 2017-07-10 DIAGNOSIS — Z794 Long term (current) use of insulin: Secondary | ICD-10-CM

## 2017-07-10 DIAGNOSIS — E785 Hyperlipidemia, unspecified: Secondary | ICD-10-CM

## 2017-08-01 ENCOUNTER — Ambulatory Visit: Payer: Medicare HMO | Admitting: Podiatry

## 2017-08-01 ENCOUNTER — Encounter: Payer: Self-pay | Admitting: Podiatry

## 2017-08-01 DIAGNOSIS — L600 Ingrowing nail: Secondary | ICD-10-CM

## 2017-08-01 DIAGNOSIS — M2041 Other hammer toe(s) (acquired), right foot: Secondary | ICD-10-CM | POA: Diagnosis not present

## 2017-08-01 NOTE — Progress Notes (Signed)
Subjective:   Patient ID: [name], male   DOB: 84 y.o.   MRN: [id number]   HPI Patient presents stating he has had some discoloration in his right second round he was concerned about damage to the bed with no discomfort noted or drainage   ROS      Objective:  Physical Exam  Crusted area with previous ingrown toenail that was done with tissue formation is moderately painful     Assessment:  Scab tissue second digit right foot with dried blood but no other pathology     Plan:  Sterile debridement of this portion of the nail bed to reduce pressure and reappoint to recheck

## 2017-08-07 ENCOUNTER — Encounter: Payer: Self-pay | Admitting: Family Medicine

## 2017-08-07 ENCOUNTER — Ambulatory Visit (INDEPENDENT_AMBULATORY_CARE_PROVIDER_SITE_OTHER): Payer: Medicare HMO | Admitting: Family Medicine

## 2017-08-07 ENCOUNTER — Other Ambulatory Visit: Payer: Self-pay

## 2017-08-07 VITALS — BP 162/72 | HR 69 | Temp 98.3°F | Ht 72.0 in | Wt 229.2 lb

## 2017-08-07 DIAGNOSIS — Z794 Long term (current) use of insulin: Secondary | ICD-10-CM | POA: Diagnosis not present

## 2017-08-07 DIAGNOSIS — N183 Chronic kidney disease, stage 3 unspecified: Secondary | ICD-10-CM

## 2017-08-07 DIAGNOSIS — E785 Hyperlipidemia, unspecified: Secondary | ICD-10-CM | POA: Diagnosis not present

## 2017-08-07 DIAGNOSIS — R809 Proteinuria, unspecified: Secondary | ICD-10-CM

## 2017-08-07 DIAGNOSIS — E1129 Type 2 diabetes mellitus with other diabetic kidney complication: Secondary | ICD-10-CM | POA: Diagnosis not present

## 2017-08-07 DIAGNOSIS — E119 Type 2 diabetes mellitus without complications: Secondary | ICD-10-CM | POA: Diagnosis not present

## 2017-08-07 DIAGNOSIS — I10 Essential (primary) hypertension: Secondary | ICD-10-CM | POA: Diagnosis not present

## 2017-08-07 MED ORDER — ATORVASTATIN CALCIUM 10 MG PO TABS: 10.0000 mg | ORAL_TABLET | Freq: Every day | ORAL | 1 refills | Status: DC

## 2017-08-07 MED ORDER — LISINOPRIL 20 MG PO TABS: 20.0000 mg | ORAL_TABLET | Freq: Every day | ORAL | 1 refills | Status: DC

## 2017-08-07 MED ORDER — METFORMIN HCL 1000 MG PO TABS: 1000.0000 mg | ORAL_TABLET | Freq: Two times a day (BID) | ORAL | 1 refills | Status: DC

## 2017-08-07 MED ORDER — LISINOPRIL-HYDROCHLOROTHIAZIDE 20-25 MG PO TABS: 1.0000 | ORAL_TABLET | Freq: Every day | ORAL | 1 refills | Status: DC

## 2017-08-07 MED ORDER — METOPROLOL TARTRATE 50 MG PO TABS: 50.0000 mg | ORAL_TABLET | Freq: Two times a day (BID) | ORAL | 1 refills | Status: DC

## 2017-08-07 MED ORDER — AMLODIPINE BESYLATE 2.5 MG PO TABS: 5.0000 mg | ORAL_TABLET | Freq: Every day | ORAL | 1 refills | Status: DC

## 2017-08-07 NOTE — Progress Notes (Signed)
Subjective:  By signing my name below, I, [name], attest that this documentation has been prepared under the direction and in the presence of [name], MD Electronically Signed: [name], ED Scribe 08/07/2017 at 9:22 AM.   Patient ID: [name], male    DOB: [birth year], 84 y.o.   MRN: [id number]  Chief Complaint  Patient presents with  . Chronic Conditions    4 month f/u   HPI Meziah L Gaby is a 84 y.o. male who presents to Primary Care at [facility] for f/u.  DM Lab Results  Component Value Date   HGBA1C 7.3 (H) 01/24/2017  A1C was ordered in Jan but not performed; checked today. Did have improvement from 8.1 to 7.3. Continued on 70/30 insulin 8 units bid and metformin 1000 mg bid. Urine micro albumin 06/2016 elevated at 4, 211.6. Lisinopril 20 mg qd in addition to Lisinopril HCTZ 20-25 mg qd Optho: 07/17/17; reports normal eye exam. Foot exam: today. Podiatry: Dr. Paulla Dolly in Feb and April. - States he is trying to walk some but is limited due to knee and hip pain. He rarely eats fast food. States he drinks a lot of coffee, ~3 cups in the mornings, but does not drink sweet teas. Reports that 1 can of soda will last him 3 days.  Diabetic Foot Exam - Simple   Simple Foot Form Diabetic Foot exam was performed with the following findings:  Yes 08/07/2017  8:53 AM  Visual Inspection No deformities, no ulcerations, no other skin breakdown bilaterally:  Yes Sensation Testing Intact to touch and monofilament testing bilaterally:  Yes Pulse Check Posterior Tibialis and Dorsalis pulse intact bilaterally:  Yes Comments    Wt Readings from Last 3 Encounters:  08/07/17 229 lb 3.2 oz (104 kg)  04/10/17 222 lb (100.7 kg)  01/24/17 223 lb (101.2 kg)  Body mass index is 31.09 kg/m.  HTN Lisinopril 20 mg qd in addition to Lisinopril HCTZ 20-25 mg qd. Takes Lopressor 50 mg bid. - Pt has been compliant with medications. BP Readings from Last 3 Encounters:  08/07/17 (!)  162/72  05/15/17 (!) 180/85  04/10/17 136/78   Lab Results  Component Value Date   CREATININE 1.56 (H) 04/29/2017   Chronic L-sided Rib and Back Pain multiple rib fx after fall in 2017. Treated by Dr. Erlinda Hong with ortho. I prescribed hydrocodone previously but discussed need to have this prescribed by pain management. Was taking 2 pills/day when last discussed in Jan. - Pt states he stopped medication for chronic rib/back pain when he quit smoking 3 months ago.  L Knee Pain Pt reports L knee pain x 3 wks. States he did a lot of walking in [place], [place] 3 wks ago and has had intermittent swelling and pain since. He has been wearing a knee sleeve and taking Tylenol for pain.  Lipid Screening Lab Results  Component Value Date   CHOL 129 01/24/2017   HDL 43 01/24/2017   LDLCALC 59 01/24/2017   TRIG 133 01/24/2017   CHOLHDL 3.0 01/24/2017  Lipitor 10 mg qd.  Tobacco Cessation Pt states that he quit smoking 3 months ago with Chantix. Reports food cravings and recent weight gain since quitting.  Patient Active Problem List   Diagnosis Date Noted  . Rib pain on left side 07/01/2016  . Fall 12/14/2015  . Elevated serum creatinine 12/14/2015  . Ileus ([place]) 12/14/2015  . Multiple fractures of ribs of left side 12/12/2015  . Prostate cancer ([place])  05/26/2014  . Blood in the urine 12/01/2013  . Cystitis, radiation 12/01/2013  . Hematuria 12/01/2013  . 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 Malignant neoplasm of prostate ([place]) 11/26/2013  . Male erectile dysfunction 11/26/2013  . Pulsatile tinnitus 11/19/2013  . Rectal bleeding 12/23/2012  . Type II or unspecified type diabetes mellitus without mention of complication, not stated as uncontrolled   . Essential hypertension, benign   . Chronic back pain   . ED (erectile dysfunction)   . Anemia   . Hyperlipidemia   . CAD, NATIVE VESSEL 05/08/2008  . VENTRICULAR TACHYCARDIA 05/08/2008  . PREMATURE VENTRICULAR CONTRACTIONS 05/08/2008   Past  Medical History:  Diagnosis Date  . Allergy   . CAD (coronary artery disease)    pci to LAD 10/09; stable CAD by cath 05/31/08; myoview 04/11/11- no ishcemia, EF 67%; echo 05/15/09- EF>55%, mod calcification of the aortic valve leaflets  . Chronic back pain   . ED (erectile dysfunction)   . Essential hypertension, benign   . Hyperlipidemia   . Multiple rib fractures    left 9th, 10th, and 11th posterior rib fractures S/P fall 12/09/2015/notes 12/12/2015  . Prostate cancer ([place])    s/p  . Type II or unspecified type diabetes mellitus without mention of complication, not stated as uncontrolled   . Ventricular tachycardia ([place])    resuscitated; monitor 05/2008; attempted T ablation 2/10- aborted due to inappropriate substrate   Past Surgical History:  Procedure Laterality Date  . CARDIAC CATHETERIZATION  05/31/08   EF 55-60%, stable lesions, medical therapy  . COLECTOMY  '80's   polyps  . CORONARY ANGIOPLASTY WITH STENT PLACEMENT  01/19/08   PCI stent to mid LAD with Promus DES 27.5x28  . IR GENERIC HISTORICAL  01/03/2016   IR THORACENTESIS ASP PLEURAL SPACE W/IMG GUIDE 01/03/2016 [facility]-INTERV RAD  . PTCA  01/2008   Allergies  Allergen Reactions  . Contrast Media [Iodinated Diagnostic Agents] Swelling    Possible neck swelling after contrast for chest CT 03/2016. No hives, throat or other symptoms   Prior to Admission medications   Medication Sig Start Date End Date Taking? Authorizing Provider  atorvastatin (LIPITOR) 10 MG tablet TAKE 1 TABLET EVERY DAY 07/10/17   [name], MD  Blood Glucose Monitoring Suppl (ACCU-CHEK AVIVA PLUS) w/Device KIT Test blood sugar 3 times daily. Dx code: E11.22 04/03/16   [name], MD  busPIRone (BUSPAR) 5 MG tablet Take 0.5-2 tablets (2.5-10 mg total) by mouth 3 (three) times daily. 07/24/16   Tereasa Coop, PA-C  diclofenac (FLECTOR) 1.3 % PTCH Place 1 patch onto the skin 2 (two) times daily. 01/29/17   Leandrew Koyanagi, MD  ferrous sulfate 325 (65  FE) MG tablet Take 325 mg by mouth daily with breakfast.    [provider]  GAVILYTE-G 236 g solution  05/24/16   [provider]  glucose blood (ACCU-CHEK AVIVA PLUS) test strip Test blood sugar 3 times daily. Dx code: E11.22 04/03/16   [name], MD  HYDROcodone-acetaminophen (NORCO/VICODIN) 5-325 MG tablet Take 1 tablet by mouth every 6 (six) hours as needed for moderate pain. 04/10/17   [name], MD  insulin glargine (LANTUS) 100 UNIT/ML injection Inject into the skin.    [provider]  insulin NPH-regular Human (NOVOLIN 70/30) (70-30) 100 UNIT/ML injection Inject 8 Units into the skin 2 (two) times daily with a meal. 04/10/17   [name], MD  Insulin Syringe-Needle U-100 (B-D INS SYR HALF-UNIT .  3CC/31G) 31G X 5/16" 0.3 ML MISC Use daily at bedtime to inject insulin. Dx code: 250.00 06/21/13   [name], MD  Lancets (ACCU-CHEK MULTICLIX) lancets Test blood sugar 3 times daily. Dx code: E11.22 04/03/16   [name], MD  lisinopril (PRINIVIL,ZESTRIL) 20 MG tablet TAKE 1 TABLET EVERY DAY 05/20/17   [name], MD  lisinopril-hydrochlorothiazide [facility]) 20-25 MG tablet TAKE 1 TABLET EVERY DAY 07/10/17   [name], MD  metFORMIN (GLUCOPHAGE) 1000 MG tablet TAKE 1 TABLET TWICE DAILY WITH MEALS 07/10/17   [name], MD  metoprolol tartrate (LOPRESSOR) 50 MG tablet TAKE 1 TABLET TWICE DAILY 07/10/17   [name], MD  Multiple Vitamin (MULTIVITAMIN) tablet Take 1 tablet by mouth daily.    [provider]  Omega-3 Fatty Acids (FISH OIL) 1000 MG CAPS Take 2 capsules by mouth daily.     [provider]  sildenafil (VIAGRA) 50 MG tablet Take 1 tablet (50 mg total) by mouth daily as needed for erectile dysfunction. 02/08/15   Lorretta Harp, MD  varenicline (CHANTIX CONTINUING MONTH PAK) 1 MG tablet Take 1 tablet (1 mg total) by mouth 2 (two) times daily. 04/10/17   [name], MD    varenicline (CHANTIX STARTING MONTH PAK) 0.5 MG X 11 & 1 MG X 42 tablet Take one 0.5 mg tablet by mouth once daily for 3 days, then increase to one 0.5 mg tablet twice daily for 4 days, then increase to one 1 mg tablet twice daily. 04/10/17   [name], MD  Vitamin D, Cholecalciferol, 1000 UNITS TABS Take 1 tablet by mouth daily.     [provider]  VOLTAREN 1 % GEL  06/08/16   [provider]   Social History   Socioeconomic History  . Marital status: Legally Separated    Spouse name: Not on file  . Number of children: 7  . Years of education: Not on file  . Highest education level: Not on file  Occupational History  . Occupation: RETIRED    Employer: RETIRED  Social Needs  . Financial resource strain: Not on file  . Food insecurity:    Worry: Not on file    Inability: Not on file  . Transportation needs:    Medical: Not on file    Non-medical: Not on file  Tobacco Use  . Smoking status: Former Smoker    Packs/day: 0.00    Years: 15.00    Pack years: 0.00    Types: Cigarettes    Last attempt to quit: 05/10/2017    Years since quitting: 0.2  . Smokeless tobacco: Never Used  . Tobacco comment: Patient smokes occasionally. Not everyday.  Substance and Sexual Activity  . Alcohol use: No  . Drug use: No  . Sexual activity: Never  Lifestyle  . Physical activity:    Days per week: Not on file    Minutes per session: Not on file  . Stress: Not on file  Relationships  . Social connections:    Talks on phone: Not on file    Gets together: Not on file    Attends religious service: Not on file    Active member of club or organization: Not on file    Attends meetings of clubs or organizations: Not on file    Relationship status: Not on file  . Intimate partner violence:    Fear of current or ex partner: Not on file  Emotionally abused: Not on file    Physically abused: Not on file    Forced sexual activity: Not on file  Other Topics Concern  . Not  on file  Social History Narrative   29 grandchildren. Single. Education: 12 th grade. Exercise: 3-4 times a week for 30 minutes working out and set ups.   Review of Systems  Constitutional: Negative for fatigue and unexpected weight change.  Eyes: Negative for visual disturbance.  Respiratory: Negative for cough, chest tightness and shortness of breath.   Cardiovascular: Negative for chest pain, palpitations and leg swelling.  Gastrointestinal: Negative for abdominal pain and blood in stool.  Musculoskeletal: Positive for arthralgias and joint swelling.  Neurological: Negative for dizziness, light-headedness and headaches.      Objective:   Physical Exam  Constitutional: He is oriented to person, place, and time. He appears well-developed and well-nourished.  HENT:  Head: Normocephalic and atraumatic.  Eyes: Pupils are equal, round, and reactive to light. EOM are normal.  Neck: No JVD present. Carotid bruit is not present.  Cardiovascular: Regular rhythm.  No murmur heard. Rare ectopic beat  Pulmonary/Chest: Effort normal and breath sounds normal. He has no rales.  Musculoskeletal: He exhibits no edema.  Neurological: He is alert and oriented to person, place, and time.  Skin: Skin is warm and dry.  Psychiatric: He has a normal mood and affect.  Vitals reviewed.  Vitals:   08/07/17 0848 08/07/17 0859  BP: (!) 172/70 (!) 162/72  Pulse: 69   Temp: 98.3 F (36.8 C)   TempSrc: Oral   SpO2: 98%   Weight: 229 lb 3.2 oz (104 kg)   Height: 6' (1.829 m)       Assessment & Plan:    RAMIREZ FULLBRIGHT is a 84 y.o. male Type 2 diabetes mellitus with microalbuminuria, with long-term current use of insulin ([place]) - Plan: Hemoglobin A1c, Microalbumin, urine Type 2 diabetes mellitus without complication, with long-term current use of insulin ([facility]) - Plan: metFORMIN (GLUCOPHAGE) 1000 MG tablet  -Check A1c, urine microalbumin.  Previously had decreased control.  Based on age would try to  get him below 7.5.  Continue same dose of  -Insulin and metformin for now.  Depending on renal function may need to stop metformin.  -Commended on smoking cessation.  Handout given on coping techniques to lessen eating as weight has increased  CKD (chronic kidney disease) stage 3, GFR 30-59 ml/min ([facility]) - Plan: Comprehensive metabolic panel, Microalbumin, urine  -Recheck CMP, may need to adjust meds as above.  Essential hypertension - Plan: Comprehensive metabolic panel, metoprolol tartrate (LOPRESSOR) 50 MG tablet, lisinopril-hydrochlorothiazide (PRINZIDE,ZESTORETIC) 20-25 MG tablet, lisinopril (PRINIVIL,ZESTRIL) 20 MG tablet, amLODipine (NORVASC) 2.5 MG tablet  -Decreased control, ideally 130/80 or below with history of diabetes and chronic kidney disease.  Continue lisinopril total 40 mg daily, HCTZ in combination, Lopressor, and add amlodipine 2.5 mg daily.  Hyperlipidemia, unspecified hyperlipidemia type - Plan: Lipid panel, atorvastatin (LIPITOR) 10 MG tablet  -Tolerating Lipitor, continue same dose.  Noted left knee pain.  Continue Tylenol, follow-up if increasing pain or persistent need for Tylenol to decide on next step.  May need to meet with orthopedist given prior degenerative changes.   Meds ordered this encounter  Medications  . metoprolol tartrate (LOPRESSOR) 50 MG tablet    Sig: Take 1 tablet (50 mg total) by mouth 2 (two) times daily.    Dispense:  180 tablet    Refill:  1  . metFORMIN (GLUCOPHAGE) 1000  MG tablet    Sig: Take 1 tablet (1,000 mg total) by mouth 2 (two) times daily with a meal.    Dispense:  180 tablet    Refill:  1  . lisinopril-hydrochlorothiazide (PRINZIDE,ZESTORETIC) 20-25 MG tablet    Sig: Take 1 tablet by mouth daily.    Dispense:  90 tablet    Refill:  1  . lisinopril (PRINIVIL,ZESTRIL) 20 MG tablet    Sig: Take 1 tablet (20 mg total) by mouth daily.    Dispense:  90 tablet    Refill:  1  . atorvastatin (LIPITOR) 10 MG tablet    Sig: Take 1  tablet (10 mg total) by mouth daily.    Dispense:  90 tablet    Refill:  1  . amLODipine (NORVASC) 2.5 MG tablet    Sig: Take 2 tablets (5 mg total) by mouth daily.    Dispense:  90 tablet    Refill:  1   Patient Instructions   Great work on quitting smoking!   Blood pressure elevated today. Add amlodipine 11m per day. Continue other medications at same doses for now.   Weight has increased some today. Be careful with diet, maintain activity most days per week. See other info on coping with quitting smoking.   Depending on A1c and kidney test, may need to adjust your diabetes medicines.  Tylenol if needed for knee pain. Follow up with me if that pain or other pains are worsening.   Thanks for coming in today.    Managing Your Hypertension Hypertension is commonly called high blood pressure. This is when the force of your blood pressing against the walls of your arteries is too strong. Arteries are blood vessels that carry blood from your heart throughout your body. Hypertension forces the heart to work harder to pump blood, and may cause the arteries to become narrow or stiff. Having untreated or uncontrolled hypertension can cause heart attack, stroke, kidney disease, and other problems. What are blood pressure readings? A blood pressure reading consists of a higher number over a lower number. Ideally, your blood pressure should be below 120/80. The first ("top") number is called the systolic pressure. It is a measure of the pressure in your arteries as your heart beats. The second ("bottom") number is called the diastolic pressure. It is a measure of the pressure in your arteries as the heart relaxes. What does my blood pressure reading mean? Blood pressure is classified into four stages. Based on your blood pressure reading, your health care provider may use the following stages to determine what type of treatment you need, if any. Systolic pressure and diastolic pressure are measured  in a unit called mm Hg. Normal  Systolic pressure: below 1644  Diastolic pressure: below 80. Elevated  Systolic pressure: 1034-742  Diastolic pressure: below 80. Hypertension stage 1  Systolic pressure: 1595-638  Diastolic pressure: 875-64 Hypertension stage 2  Systolic pressure: 1332or above.  Diastolic pressure: 90 or above. What health risks are associated with hypertension? Managing your hypertension is an important responsibility. Uncontrolled hypertension can lead to:  A heart attack.  A stroke.  A weakened blood vessel (aneurysm).  Heart failure.  Kidney damage.  Eye damage.  Metabolic syndrome.  Memory and concentration problems.  What changes can I make to manage my hypertension? Hypertension can be managed by making lifestyle changes and possibly by taking medicines. Your health care provider will help you make a plan to bring your blood pressure within a  normal range. Eating and drinking  Eat a diet that is high in fiber and potassium, and low in salt (sodium), added sugar, and fat. An example eating plan is called the DASH (Dietary Approaches to Stop Hypertension) diet. To eat this way: ? Eat plenty of fresh fruits and vegetables. Try to fill half of your plate at each meal with fruits and vegetables. ? Eat whole grains, such as whole wheat pasta, brown rice, or whole grain bread. Fill about one quarter of your plate with whole grains. ? Eat low-fat diary products. ? Avoid fatty cuts of meat, processed or cured meats, and poultry with skin. Fill about one quarter of your plate with lean proteins such as fish, chicken without skin, beans, eggs, and tofu. ? Avoid premade and processed foods. These tend to be higher in sodium, added sugar, and fat.  Reduce your daily sodium intake. Most people with hypertension should eat less than 1,500 mg of sodium a day.  Limit alcohol intake to no more than 1 drink a day for nonpregnant women and 2 drinks a day for  men. One drink equals 12 oz of beer, 5 oz of wine, or 1 oz of hard liquor. Lifestyle  Work with your health care provider to maintain a healthy body weight, or to lose weight. Ask what an ideal weight is for you.  Get at least 30 minutes of exercise that causes your heart to beat faster (aerobic exercise) most days of the week. Activities may include walking, swimming, or biking.  Include exercise to strengthen your muscles (resistance exercise), such as weight lifting, as part of your weekly exercise routine. Try to do these types of exercises for 30 minutes at least 3 days a week.  Do not use any products that contain nicotine or tobacco, such as cigarettes and e-cigarettes. If you need help quitting, ask your health care provider.  Control any long-term (chronic) conditions you have, such as high cholesterol or diabetes. Monitoring  Monitor your blood pressure at home as told by your health care provider. Your personal target blood pressure may vary depending on your medical conditions, your age, and other factors.  Have your blood pressure checked regularly, as often as told by your health care provider. Working with your health care provider  Review all the medicines you take with your health care provider because there may be side effects or interactions.  Talk with your health care provider about your diet, exercise habits, and other lifestyle factors that may be contributing to hypertension.  Visit your health care provider regularly. Your health care provider can help you create and adjust your plan for managing hypertension. Will I need medicine to control my blood pressure? Your health care provider may prescribe medicine if lifestyle changes are not enough to get your blood pressure under control, and if:  Your systolic blood pressure is 130 or higher.  Your diastolic blood pressure is 80 or higher.  Take medicines only as told by your health care provider. Follow the  directions carefully. Blood pressure medicines must be taken as prescribed. The medicine does not work as well when you skip doses. Skipping doses also puts you at risk for problems. Contact a health care provider if:  You think you are having a reaction to medicines you have taken.  You have repeated (recurrent) headaches.  You feel dizzy.  You have swelling in your ankles.  You have trouble with your vision. Get help right away if:  You develop a  severe headache or confusion.  You have unusual weakness or numbness, or you feel faint.  You have severe pain in your chest or abdomen.  You vomit repeatedly.  You have trouble breathing. Summary  Hypertension is when the force of blood pumping through your arteries is too strong. If this condition is not controlled, it may put you at risk for serious complications.  Your personal target blood pressure may vary depending on your medical conditions, your age, and other factors. For most people, a normal blood pressure is less than 120/80.  Hypertension is managed by lifestyle changes, medicines, or both. Lifestyle changes include weight loss, eating a healthy, low-sodium diet, exercising more, and limiting alcohol. This information is not intended to replace advice given to you by your health care provider. Make sure you discuss any questions you have with your health care provider. Document Released: 12/18/2011 Document Revised: 02/21/2016 Document Reviewed: 02/21/2016 Elsevier Interactive Patient Education  2018 [place] with Quitting Smoking Quitting smoking is a physical and mental challenge. You will face cravings, withdrawal symptoms, and temptation. Before quitting, work with your health care provider to make a plan that can help you cope. Preparation can help you quit and keep you from giving in. How can I cope with cravings? Cravings usually last for 5-10 minutes. If you get through it, the craving will pass.  Consider taking the following actions to help you cope with cravings:  Keep your mouth busy: ? Chew sugar-free gum. ? Suck on hard candies or a straw. ? Brush your teeth.  Keep your hands and body busy: ? Immediately change to a different activity when you feel a craving. ? Squeeze or play with a ball. ? Do an activity or a hobby, like making bead jewelry, practicing needlepoint, or working with wood. ? Mix up your normal routine. ? Take a short exercise break. Go for a quick walk or run up and down stairs. ? Spend time in public places where smoking is not allowed.  Focus on doing something kind or helpful for someone else.  Call a friend or family member to talk during a craving.  Join a support group.  Call a quit line, such as 1-800-QUIT-NOW.  Talk with your health care provider about medicines that might help you cope with cravings and make quitting easier for you.  How can I deal with withdrawal symptoms? Your body may experience negative effects as it tries to get used to not having nicotine in the system. These effects are called withdrawal symptoms. They may include:  Feeling hungrier than normal.  Trouble concentrating.  Irritability.  Trouble sleeping.  Feeling depressed.  Restlessness and agitation.  Craving a cigarette.  To manage withdrawal symptoms:  Avoid places, people, and activities that trigger your cravings.  Remember why you want to quit.  Get plenty of sleep.  Avoid coffee and other caffeinated drinks. These may worsen some of your symptoms.  How can I handle social situations? Social situations can be difficult when you are quitting smoking, especially in the first few weeks. To manage this, you can:  Avoid parties, bars, and other social situations where people might be smoking.  Avoid alcohol.  Leave right away if you have the urge to smoke.  Explain to your family and friends that you are quitting smoking. Ask for understanding  and support.  Plan activities with friends or family where smoking is not an option.  What are some ways I can cope with  stress? Wanting to smoke may cause stress, and stress can make you want to smoke. Find ways to manage your stress. Relaxation techniques can help. For example:  Breathe slowly and deeply, in through your nose and out through your mouth.  Listen to soothing, relaxing music.  Talk with a family member or friend about your stress.  Light a candle.  Soak in a bath or take a shower.  Think about a peaceful place.  What are some ways I can prevent weight gain? Be aware that many people gain weight after they quit smoking. However, not everyone does. To keep from gaining weight, have a plan in place before you quit and stick to the plan after you quit. Your plan should include:  Having healthy snacks. When you have a craving, it may help to: ? Eat plain popcorn, crunchy carrots, celery, or other cut vegetables. ? Chew sugar-free gum.  Changing how you eat: ? Eat small portion sizes at meals. ? Eat 4-6 small meals throughout the day instead of 1-2 large meals a day. ? Be mindful when you eat. Do not watch television or do other things that might distract you as you eat.  Exercising regularly: ? Make time to exercise each day. If you do not have time for a long workout, do short bouts of exercise for 5-10 minutes several times a day. ? Do some form of strengthening exercise, like weight lifting, and some form of aerobic exercise, like running or swimming.  Drinking plenty of water or other low-calorie or no-calorie drinks. Drink 6-8 glasses of water daily, or as much as instructed by your health care provider.  Summary  Quitting smoking is a physical and mental challenge. You will face cravings, withdrawal symptoms, and temptation to smoke again. Preparation can help you as you go through these challenges.  You can cope with cravings by keeping your mouth busy (such  as by chewing gum), keeping your body and hands busy, and making calls to family, friends, or a helpline for people who want to quit smoking.  You can cope with withdrawal symptoms by avoiding places where people smoke, avoiding drinks with caffeine, and getting plenty of rest.  Ask your health care provider about the different ways to prevent weight gain, avoid stress, and handle social situations. This information is not intended to replace advice given to you by your health care provider. Make sure you discuss any questions you have with your health care provider. Document Released: 03/22/2016 Document Revised: 03/22/2016 Document Reviewed: 03/22/2016 Elsevier Interactive Patient Education  2018 [employer].    IF you received an x-ray today, you will receive an invoice from [facility]. Please contact [facility] at [phone] with questions or concerns regarding your invoice.   IF you received labwork today, you will receive an invoice from [place]. Please contact [facility] at [phone] with questions or concerns regarding your invoice.   Our billing staff will not be able to assist you with questions regarding bills from these companies.  You will be contacted with the lab results as soon as they are available. The fastest way to get your results is to activate your My Chart account. Instructions are located on the last page of this paperwork. If you have not heard from [place] regarding the results in 2 weeks, please contact this office.       I personally performed the services described in this documentation, which was scribed in my presence. The recorded information has been reviewed and  considered for accuracy and completeness, addended by me as needed, and agree with information above.  Signed,   [name], MD [facility].  08/07/17 9:42 AM

## 2017-08-07 NOTE — Patient Instructions (Addendum)
Great work on quitting smoking!   Blood pressure elevated today. Add amlodipine 5mg  per day. Continue other medications at same doses for now.   Weight has increased some today. Be careful with diet, maintain activity most days per week. See other info on coping with quitting smoking.   Depending on A1c and kidney test, may need to adjust your diabetes medicines.  Tylenol if needed for knee pain. Follow up with me if that pain or other pains are worsening.   Thanks for coming in today.    Managing Your Hypertension Hypertension is commonly called high blood pressure. This is when the force of your blood pressing against the walls of your arteries is too strong. Arteries are blood vessels that carry blood from your heart throughout your body. Hypertension forces the heart to work harder to pump blood, and may cause the arteries to become narrow or stiff. Having untreated or uncontrolled hypertension can cause heart attack, stroke, kidney disease, and other problems. What are blood pressure readings? A blood pressure reading consists of a higher number over a lower number. Ideally, your blood pressure should be below 120/80. The first ("top") number is called the systolic pressure. It is a measure of the pressure in your arteries as your heart beats. The second ("bottom") number is called the diastolic pressure. It is a measure of the pressure in your arteries as the heart relaxes. What does my blood pressure reading mean? Blood pressure is classified into four stages. Based on your blood pressure reading, your health care provider may use the following stages to determine what type of treatment you need, if any. Systolic pressure and diastolic pressure are measured in a unit called mm Hg. Normal  Systolic pressure: below 017.  Diastolic pressure: below 80. Elevated  Systolic pressure: 793-903.  Diastolic pressure: below 80. Hypertension stage 1  Systolic pressure: 009-233.  Diastolic  pressure: 00-76. Hypertension stage 2  Systolic pressure: 226 or above.  Diastolic pressure: 90 or above. What health risks are associated with hypertension? Managing your hypertension is an important responsibility. Uncontrolled hypertension can lead to:  A heart attack.  A stroke.  A weakened blood vessel (aneurysm).  Heart failure.  Kidney damage.  Eye damage.  Metabolic syndrome.  Memory and concentration problems.  What changes can I make to manage my hypertension? Hypertension can be managed by making lifestyle changes and possibly by taking medicines. Your health care provider will help you make a plan to bring your blood pressure within a normal range. Eating and drinking  Eat a diet that is high in fiber and potassium, and low in salt (sodium), added sugar, and fat. An example eating plan is called the DASH (Dietary Approaches to Stop Hypertension) diet. To eat this way: ? Eat plenty of fresh fruits and vegetables. Try to fill half of your plate at each meal with fruits and vegetables. ? Eat whole grains, such as whole wheat pasta, brown rice, or whole grain bread. Fill about one quarter of your plate with whole grains. ? Eat low-fat diary products. ? Avoid fatty cuts of meat, processed or cured meats, and poultry with skin. Fill about one quarter of your plate with lean proteins such as fish, chicken without skin, beans, eggs, and tofu. ? Avoid premade and processed foods. These tend to be higher in sodium, added sugar, and fat.  Reduce your daily sodium intake. Most people with hypertension should eat less than 1,500 mg of sodium a day.  Limit alcohol intake  to no more than 1 drink a day for nonpregnant women and 2 drinks a day for men. One drink equals 12 oz of beer, 5 oz of wine, or 1 oz of hard liquor. Lifestyle  Work with your health care provider to maintain a healthy body weight, or to lose weight. Ask what an ideal weight is for you.  Get at least 30  minutes of exercise that causes your heart to beat faster (aerobic exercise) most days of the week. Activities may include walking, swimming, or biking.  Include exercise to strengthen your muscles (resistance exercise), such as weight lifting, as part of your weekly exercise routine. Try to do these types of exercises for 30 minutes at least 3 days a week.  Do not use any products that contain nicotine or tobacco, such as cigarettes and e-cigarettes. If you need help quitting, ask your health care provider.  Control any long-term (chronic) conditions you have, such as high cholesterol or diabetes. Monitoring  Monitor your blood pressure at home as told by your health care provider. Your personal target blood pressure may vary depending on your medical conditions, your age, and other factors.  Have your blood pressure checked regularly, as often as told by your health care provider. Working with your health care provider  Review all the medicines you take with your health care provider because there may be side effects or interactions.  Talk with your health care provider about your diet, exercise habits, and other lifestyle factors that may be contributing to hypertension.  Visit your health care provider regularly. Your health care provider can help you create and adjust your plan for managing hypertension. Will I need medicine to control my blood pressure? Your health care provider may prescribe medicine if lifestyle changes are not enough to get your blood pressure under control, and if:  Your systolic blood pressure is 130 or higher.  Your diastolic blood pressure is 80 or higher.  Take medicines only as told by your health care provider. Follow the directions carefully. Blood pressure medicines must be taken as prescribed. The medicine does not work as well when you skip doses. Skipping doses also puts you at risk for problems. Contact a health care provider if:  You think you are  having a reaction to medicines you have taken.  You have repeated (recurrent) headaches.  You feel dizzy.  You have swelling in your ankles.  You have trouble with your vision. Get help right away if:  You develop a severe headache or confusion.  You have unusual weakness or numbness, or you feel faint.  You have severe pain in your chest or abdomen.  You vomit repeatedly.  You have trouble breathing. Summary  Hypertension is when the force of blood pumping through your arteries is too strong. If this condition is not controlled, it may put you at risk for serious complications.  Your personal target blood pressure may vary depending on your medical conditions, your age, and other factors. For most people, a normal blood pressure is less than 120/80.  Hypertension is managed by lifestyle changes, medicines, or both. Lifestyle changes include weight loss, eating a healthy, low-sodium diet, exercising more, and limiting alcohol. This information is not intended to replace advice given to you by your health care provider. Make sure you discuss any questions you have with your health care provider. Document Released: 12/18/2011 Document Revised: 02/21/2016 Document Reviewed: 02/21/2016 Elsevier Interactive Patient Education  2018 [place] with Quitting  Smoking Quitting smoking is a physical and mental challenge. You will face cravings, withdrawal symptoms, and temptation. Before quitting, work with your health care provider to make a plan that can help you cope. Preparation can help you quit and keep you from giving in. How can I cope with cravings? Cravings usually last for 5-10 minutes. If you get through it, the craving will pass. Consider taking the following actions to help you cope with cravings:  Keep your mouth busy: ? Chew sugar-free gum. ? Suck on hard candies or a straw. ? Brush your teeth.  Keep your hands and body busy: ? Immediately change to a  different activity when you feel a craving. ? Squeeze or play with a ball. ? Do an activity or a hobby, like making bead jewelry, practicing needlepoint, or working with wood. ? Mix up your normal routine. ? Take a short exercise break. Go for a quick walk or run up and down stairs. ? Spend time in public places where smoking is not allowed.  Focus on doing something kind or helpful for someone else.  Call a friend or family member to talk during a craving.  Join a support group.  Call a quit line, such as 1-800-QUIT-NOW.  Talk with your health care provider about medicines that might help you cope with cravings and make quitting easier for you.  How can I deal with withdrawal symptoms? Your body may experience negative effects as it tries to get used to not having nicotine in the system. These effects are called withdrawal symptoms. They may include:  Feeling hungrier than normal.  Trouble concentrating.  Irritability.  Trouble sleeping.  Feeling depressed.  Restlessness and agitation.  Craving a cigarette.  To manage withdrawal symptoms:  Avoid places, people, and activities that trigger your cravings.  Remember why you want to quit.  Get plenty of sleep.  Avoid coffee and other caffeinated drinks. These may worsen some of your symptoms.  How can I handle social situations? Social situations can be difficult when you are quitting smoking, especially in the first few weeks. To manage this, you can:  Avoid parties, bars, and other social situations where people might be smoking.  Avoid alcohol.  Leave right away if you have the urge to smoke.  Explain to your family and friends that you are quitting smoking. Ask for understanding and support.  Plan activities with friends or family where smoking is not an option.  What are some ways I can cope with stress? Wanting to smoke may cause stress, and stress can make you want to smoke. Find ways to manage your  stress. Relaxation techniques can help. For example:  Breathe slowly and deeply, in through your nose and out through your mouth.  Listen to soothing, relaxing music.  Talk with a family member or friend about your stress.  Light a candle.  Soak in a bath or take a shower.  Think about a peaceful place.  What are some ways I can prevent weight gain? Be aware that many people gain weight after they quit smoking. However, not everyone does. To keep from gaining weight, have a plan in place before you quit and stick to the plan after you quit. Your plan should include:  Having healthy snacks. When you have a craving, it may help to: ? Eat plain popcorn, crunchy carrots, celery, or other cut vegetables. ? Chew sugar-free gum.  Changing how you eat: ? Eat small portion sizes at meals. ? Eat 4-6  small meals throughout the day instead of 1-2 large meals a day. ? Be mindful when you eat. Do not watch television or do other things that might distract you as you eat.  Exercising regularly: ? Make time to exercise each day. If you do not have time for a long workout, do short bouts of exercise for 5-10 minutes several times a day. ? Do some form of strengthening exercise, like weight lifting, and some form of aerobic exercise, like running or swimming.  Drinking plenty of water or other low-calorie or no-calorie drinks. Drink 6-8 glasses of water daily, or as much as instructed by your health care provider.  Summary  Quitting smoking is a physical and mental challenge. You will face cravings, withdrawal symptoms, and temptation to smoke again. Preparation can help you as you go through these challenges.  You can cope with cravings by keeping your mouth busy (such as by chewing gum), keeping your body and hands busy, and making calls to family, friends, or a helpline for people who want to quit smoking.  You can cope with withdrawal symptoms by avoiding places where people smoke, avoiding  drinks with caffeine, and getting plenty of rest.  Ask your health care provider about the different ways to prevent weight gain, avoid stress, and handle social situations. This information is not intended to replace advice given to you by your health care provider. Make sure you discuss any questions you have with your health care provider. Document Released: 03/22/2016 Document Revised: 03/22/2016 Document Reviewed: 03/22/2016 Elsevier Interactive Patient Education  2018 [employer].    IF you received an x-ray today, you will receive an invoice from [facility]. Please contact [facility] at [phone] with questions or concerns regarding your invoice.   IF you received labwork today, you will receive an invoice from [place]. Please contact [facility] at [phone] with questions or concerns regarding your invoice.   Our billing staff will not be able to assist you with questions regarding bills from these companies.  You will be contacted with the lab results as soon as they are available. The fastest way to get your results is to activate your My Chart account. Instructions are located on the last page of this paperwork. If you have not heard from [place] regarding the results in 2 weeks, please contact this office.

## 2017-08-08 LAB — COMPREHENSIVE METABOLIC PANEL
ALT: 8 IU/L (ref 0–44)
AST: 11 IU/L (ref 0–40)
Albumin/Globulin Ratio: 1.7 (ref 1.2–2.2)
Albumin: 3.9 g/dL (ref 3.5–4.7)
Alkaline Phosphatase: 41 IU/L (ref 39–117)
BUN/Creatinine Ratio: 13 (ref 10–24)
BUN: 19 mg/dL (ref 8–27)
Bilirubin Total: 0.4 mg/dL (ref 0.0–1.2)
CO2: 20 mmol/L (ref 20–29)
Calcium: 9.3 mg/dL (ref 8.6–10.2)
Chloride: 104 mmol/L (ref 96–106)
Creatinine, Ser: 1.47 mg/dL — ABNORMAL HIGH (ref 0.76–1.27)
GFR calc Af Amer: 51 mL/min/{1.73_m2} — ABNORMAL LOW (ref >59)
GFR calc non Af Amer: 44 mL/min/{1.73_m2} — ABNORMAL LOW (ref >59)
Globulin, Total: 2.3 g/dL (ref 1.5–4.5)
Glucose: 111 mg/dL — ABNORMAL HIGH (ref 65–99)
Potassium: 4.3 mmol/L (ref 3.5–5.2)
Sodium: 139 mmol/L (ref 134–144)
Total Protein: 6.2 g/dL (ref 6.0–8.5)

## 2017-08-08 LAB — LIPID PANEL
Chol/HDL Ratio: 3.4 ratio (ref 0.0–5.0)
Cholesterol, Total: 145 mg/dL (ref 100–199)
HDL: 43 mg/dL (ref >39)
LDL Calculated: 73 mg/dL (ref 0–99)
Triglycerides: 144 mg/dL (ref 0–149)
VLDL Cholesterol Cal: 29 mg/dL (ref 5–40)

## 2017-08-08 LAB — MICROALBUMIN, URINE: Microalbumin, Urine: 1066.8 ug/mL

## 2017-08-08 LAB — HEMOGLOBIN A1C
Est. average glucose Bld gHb Est-mCnc: 163 mg/dL
Hgb A1c MFr Bld: 7.3 % — ABNORMAL HIGH (ref 4.8–5.6)

## 2017-08-18 DIAGNOSIS — C61 Malignant neoplasm of prostate: Secondary | ICD-10-CM | POA: Diagnosis not present

## 2017-09-10 DIAGNOSIS — R319 Hematuria, unspecified: Secondary | ICD-10-CM | POA: Diagnosis not present

## 2017-09-10 DIAGNOSIS — C61 Malignant neoplasm of prostate: Secondary | ICD-10-CM | POA: Diagnosis not present

## 2017-09-10 DIAGNOSIS — R3129 Other microscopic hematuria: Secondary | ICD-10-CM | POA: Diagnosis not present

## 2017-09-24 DIAGNOSIS — R310 Gross hematuria: Secondary | ICD-10-CM | POA: Diagnosis not present

## 2017-09-24 DIAGNOSIS — Z8546 Personal history of malignant neoplasm of prostate: Secondary | ICD-10-CM | POA: Diagnosis not present

## 2017-09-24 DIAGNOSIS — C61 Malignant neoplasm of prostate: Secondary | ICD-10-CM | POA: Diagnosis not present

## 2017-09-24 DIAGNOSIS — I700 Atherosclerosis of aorta: Secondary | ICD-10-CM | POA: Diagnosis not present

## 2017-10-01 DIAGNOSIS — C679 Malignant neoplasm of bladder, unspecified: Secondary | ICD-10-CM | POA: Diagnosis not present

## 2017-10-01 DIAGNOSIS — I499 Cardiac arrhythmia, unspecified: Secondary | ICD-10-CM | POA: Diagnosis not present

## 2017-10-01 DIAGNOSIS — Z01818 Encounter for other preprocedural examination: Secondary | ICD-10-CM | POA: Diagnosis not present

## 2017-10-03 DIAGNOSIS — C672 Malignant neoplasm of lateral wall of bladder: Secondary | ICD-10-CM | POA: Diagnosis not present

## 2017-10-03 DIAGNOSIS — C679 Malignant neoplasm of bladder, unspecified: Secondary | ICD-10-CM | POA: Diagnosis not present

## 2017-10-03 DIAGNOSIS — C61 Malignant neoplasm of prostate: Secondary | ICD-10-CM | POA: Diagnosis not present

## 2017-10-03 DIAGNOSIS — N529 Male erectile dysfunction, unspecified: Secondary | ICD-10-CM | POA: Diagnosis not present

## 2017-10-13 ENCOUNTER — Other Ambulatory Visit: Payer: Self-pay | Admitting: Family Medicine

## 2017-10-13 NOTE — Telephone Encounter (Signed)
Patient wanted to let Dr. Nyoka Cowden aware that he is leaving out of town 10/15/17 and hopes to have it before then.

## 2017-10-23 DIAGNOSIS — C61 Malignant neoplasm of prostate: Secondary | ICD-10-CM | POA: Diagnosis not present

## 2017-10-23 DIAGNOSIS — Z192 Hormone resistant malignancy status: Secondary | ICD-10-CM | POA: Diagnosis not present

## 2017-10-27 ENCOUNTER — Other Ambulatory Visit: Payer: Self-pay

## 2017-10-27 ENCOUNTER — Encounter: Payer: Self-pay | Admitting: Physician Assistant

## 2017-10-27 ENCOUNTER — Ambulatory Visit (INDEPENDENT_AMBULATORY_CARE_PROVIDER_SITE_OTHER): Payer: Medicare HMO

## 2017-10-27 ENCOUNTER — Ambulatory Visit (INDEPENDENT_AMBULATORY_CARE_PROVIDER_SITE_OTHER): Payer: Medicare HMO | Admitting: Physician Assistant

## 2017-10-27 VITALS — BP 121/74 | HR 84 | Temp 98.9°F | Resp 20 | Ht 70.16 in | Wt 227.2 lb

## 2017-10-27 DIAGNOSIS — J029 Acute pharyngitis, unspecified: Secondary | ICD-10-CM | POA: Diagnosis not present

## 2017-10-27 DIAGNOSIS — J069 Acute upper respiratory infection, unspecified: Secondary | ICD-10-CM

## 2017-10-27 DIAGNOSIS — R05 Cough: Secondary | ICD-10-CM

## 2017-10-27 DIAGNOSIS — R059 Cough, unspecified: Secondary | ICD-10-CM

## 2017-10-27 LAB — POCT RAPID STREP A (OFFICE): Rapid Strep A Screen: NEGATIVE

## 2017-10-27 MED ORDER — GUAIFENESIN ER 1200 MG PO TB12: 1.0000 | ORAL_TABLET | Freq: Two times a day (BID) | ORAL | 1 refills | Status: DC | PRN

## 2017-10-27 MED ORDER — AZELASTINE HCL 0.1 % NA SOLN: 2.0000 | Freq: Two times a day (BID) | NASAL | 0 refills | Status: DC

## 2017-10-27 MED ORDER — BENZONATATE 100 MG PO CAPS: 100.0000 mg | ORAL_CAPSULE | Freq: Three times a day (TID) | ORAL | 0 refills | Status: DC | PRN

## 2017-10-27 NOTE — Patient Instructions (Addendum)
- We will treat this as a respiratory viral infection.  - I recommend you rest, drink plenty of fluids, eat light meals including soups.  - You may use delsym cough syrup at night for your cough and sore throat, Tessalon pearls during the day to stop the cough and mucinex to help bring it up. Be aware that cough syrup can definitely make you drowsy and sleepy so do not drive or operate any heavy machinery if it is affecting you during the day.  - You may also use Tylenol over-the-counter for your sore throat. Tea recipe for sore throat: boil water, add 2 inches shaved ginger root, steep 15 minutes, add juice from 2 full lemons, and 2 tbsp honey. - Please let me know if you are not seeing any improvement or get worse in  5-7 days.   Upper Respiratory Infection, Adult Most upper respiratory infections (URIs) are caused by a virus. A URI affects the nose, throat, and upper air passages. The most common type of URI is often called "the common cold." Follow these instructions at home:  Take medicines only as told by your doctor.  Gargle warm saltwater or take cough drops to comfort your throat as told by your doctor.  Use a warm mist humidifier or inhale steam from a shower to increase air moisture. This may make it easier to breathe.  Drink enough fluid to keep your pee (urine) clear or pale yellow.  Eat soups and other clear broths.  Have a healthy diet.  Rest as needed.  Go back to work when your fever is gone or your doctor says it is okay. ? You may need to stay home longer to avoid giving your URI to others. ? You can also wear a face mask and wash your hands often to prevent spread of the virus.  Use your inhaler more if you have asthma.  Do not use any tobacco products, including cigarettes, chewing tobacco, or electronic cigarettes. If you need help quitting, ask your doctor. Contact a doctor if:  You are getting worse, not better.  Your symptoms are not helped by  medicine.  You have chills.  You are getting more short of breath.  You have brown or red mucus.  You have yellow or brown discharge from your nose.  You have pain in your face, especially when you bend forward.  You have a fever.  You have puffy (swollen) neck glands.  You have pain while swallowing.  You have white areas in the back of your throat. Get help right away if:  You have very bad or constant: ? Headache. ? Ear pain. ? Pain in your forehead, behind your eyes, and over your cheekbones (sinus pain). ? Chest pain.  You have long-lasting (chronic) lung disease and any of the following: ? Wheezing. ? Long-lasting cough. ? Coughing up blood. ? A change in your usual mucus.  You have a stiff neck.  You have changes in your: ? Vision. ? Hearing. ? Thinking. ? Mood. This information is not intended to replace advice given to you by your health care provider. Make sure you discuss any questions you have with your health care provider. Document Released: 09/11/2007 Document Revised: 11/26/2015 Document Reviewed: 06/30/2013 Elsevier Interactive Patient Education  2018 [employer].      IF you received an x-ray today, you will receive an invoice from [facility]. Please contact [facility] at [phone] with questions or concerns regarding your invoice.   IF you  received labwork today, you will receive an invoice from [facility]. Please contact [facility] at [phone] with questions or concerns regarding your invoice.   Our billing staff will not be able to assist you with questions regarding bills from these companies.  You will be contacted with the lab results as soon as they are available. The fastest way to get your results is to activate your My Chart account. Instructions are located on the last page of this paperwork. If you have not heard from [place] regarding the results in 2 weeks, please contact this office.

## 2017-10-27 NOTE — Progress Notes (Signed)
MRN: [id number] DOB: [birth year]  Subjective:   [name] is a 84 y.o. male presenting for chief complaint of Cough (X 4 days) and Sore Throat (X 3 days) .  Reports 5 day history of rhinorrhea, sore throat and productive cough (hacking in nature, with greenish phlegm production), fatigue. Will cough so hard, have some SOB. Has tried mucinex with some relief. Denies fever, sinus pain, ear pain, wheezing, chest pain and myalgia, night sweats, chills, nausea, vomiting, abdominal pain and diarrhea. Has not had sick contact with anyone. Has history of seasonal allergies and thinks it all started when he was exposed to hot humid air in [place]. No history of asthma. Patient has had flu shot this season. Former smoker, quit 5 months ago. Smoked 0.5 ppd x 10 years prior to this. Denies any other aggravating or relieving factors, no other questions or concerns.  [name] has a current medication list which includes the following prescription(s): amlodipine, atorvastatin, accu-chek aviva plus, buspirone, diclofenac, ferrous sulfate, gavilyte-g, glucose blood, hydrocodone-acetaminophen, insulin syringe-needle u-100, accu-chek multiclix, lisinopril, lisinopril-hydrochlorothiazide, metformin, metoprolol tartrate, multivitamin, novolin 70/30, fish oil, sildenafil, varenicline, varenicline, vitamin d (cholecalciferol), and voltaren. Also is allergic to contrast media [iodinated diagnostic agents].  [name]  has a past medical history of Allergy, CAD (coronary artery disease), Chronic back pain, ED (erectile dysfunction), Essential hypertension, benign, Hyperlipidemia, Multiple rib fractures, Prostate cancer ([place]), Type II or unspecified type diabetes mellitus without mention of complication, not stated as uncontrolled, and Ventricular tachycardia ([place]). Also  has a past surgical history that includes Mitral valve replacement (01/2008); Colectomy ('80's); Cardiac catheterization (05/31/08); Coronary  angioplasty with stent (01/19/08); and [name] (01/03/2016).   Objective:   Vitals: BP 121/74 (BP Location: Left Arm, Patient Position: Sitting, Cuff Size: Large)   Pulse 84   Temp 98.9 F (37.2 C) (Oral)   Resp 20   Ht 5' 10.16" (1.782 m)   Wt 227 lb 3.2 oz (103.1 kg)   SpO2 95%   BMI 32.45 kg/m   Physical Exam  Constitutional: He is oriented to person, place, and time. He appears well-developed and well-nourished. No distress.  HENT:  Head: Normocephalic and atraumatic.  Right Ear: Tympanic membrane, external ear and ear canal normal.  Left Ear: Tympanic membrane, external ear and ear canal normal.  Nose: Mucosal edema and rhinorrhea present. Right sinus exhibits no maxillary sinus tenderness and no frontal sinus tenderness. Left sinus exhibits no maxillary sinus tenderness and no frontal sinus tenderness.  Mouth/Throat: Uvula is midline and mucous membranes are normal. Posterior oropharyngeal erythema present. No posterior oropharyngeal edema or tonsillar abscesses. Tonsils are 1+ on the right. Tonsils are 1+ on the left. No tonsillar exudate.  Tonsils are erythematous b/l  Eyes: Conjunctivae are normal.  Neck: Normal range of motion.  Cardiovascular: Normal rate, regular rhythm, normal heart sounds and intact distal pulses.  Pulmonary/Chest: Effort normal and breath sounds normal. He has no decreased breath sounds. He has no wheezes. He has no rhonchi. He has no rales.  Lymphadenopathy:       Head (right side): No submental, no submandibular, no tonsillar, no preauricular, no posterior auricular and no occipital adenopathy present.       Head (left side): No submental, no submandibular, no tonsillar, no preauricular, no posterior auricular and no occipital adenopathy present.    He has no cervical adenopathy.       Right: No supraclavicular adenopathy present.       Left: No supraclavicular  adenopathy present.  Neurological: He is alert and oriented to person,  place, and time.  Skin: Skin is warm and dry.  Psychiatric: He has a normal mood and affect.  Vitals reviewed.   Results for orders placed or performed in visit on 10/27/17 (from the past 24 hour(s))  POCT rapid strep A     Status: None   Collection Time: 10/27/17 12:04 PM  Result Value Ref Range   Rapid Strep A Screen Negative Negative    Dg Chest 2 View  Result Date: 10/27/2017 CLINICAL DATA:  Productive cough for 5 days.  Former smoker. EXAM: CHEST - 2 VIEW COMPARISON:  Chest x-ray dated 10/01/2017. chest x-ray dated 12/19/2016. FINDINGS: Heart size and mediastinal contours are within normal limits. Atherosclerotic changes noted at the aortic arch. Probable mild scarring/fibrosis at the LEFT lung base, stable. Lungs are otherwise clear. No pleural effusion or pneumothorax seen. No acute or suspicious osseous finding. IMPRESSION: No active cardiopulmonary disease. No evidence of pneumonia or pulmonary edema. Aortic atherosclerosis. Electronically Signed   By: Franki Cabot M.D.   On: 10/27/2017 12:25    Assessment and Plan :  1. Acute upper respiratory infection - Likely viral in etiology d/t reassuring physical exam findings and labs. - Advised supportive care, offered symptomatic relief. - Return to clinic if symptoms worsen or fail to improve in 5-7 days, otherwise return to clinic as needed. - benzonatate (TESSALON) 100 MG capsule; Take 1-2 capsules (100-200 mg total) by mouth 3 (three) times daily as needed for cough.  Dispense: 40 capsule; Refill: 0 - azelastine (ASTELIN) 0.1 % nasal spray; Place 2 sprays into both nostrils 2 (two) times daily. Use in each nostril as directed  Dispense: 30 mL; Refill: 0 - Guaifenesin (MUCINEX MAXIMUM STRENGTH) 1200 MG TB12; Take 1 tablet (1,200 mg total) by mouth every 12 (twelve) hours as needed.  Dispense: 14 tablet; Refill: 1  2. Cough - DG Chest 2 View; Future  3. Sore throat - POCT rapid strep A - Culture, Group A Strep   Tenna Delaine, PA-C  [facility] 10/27/2017 12:32 PM

## 2017-10-30 LAB — CULTURE, GROUP A STREP: Strep A Culture: NEGATIVE

## 2017-10-31 ENCOUNTER — Ambulatory Visit (INDEPENDENT_AMBULATORY_CARE_PROVIDER_SITE_OTHER): Payer: Medicare HMO | Admitting: Family Medicine

## 2017-10-31 ENCOUNTER — Encounter: Payer: Self-pay | Admitting: Family Medicine

## 2017-10-31 VITALS — BP 135/69 | HR 73 | Temp 99.0°F | Resp 17 | Ht 70.0 in | Wt 227.0 lb

## 2017-10-31 DIAGNOSIS — H1033 Unspecified acute conjunctivitis, bilateral: Secondary | ICD-10-CM

## 2017-10-31 DIAGNOSIS — R05 Cough: Secondary | ICD-10-CM | POA: Diagnosis not present

## 2017-10-31 DIAGNOSIS — R059 Cough, unspecified: Secondary | ICD-10-CM

## 2017-10-31 DIAGNOSIS — J22 Unspecified acute lower respiratory infection: Secondary | ICD-10-CM | POA: Diagnosis not present

## 2017-10-31 MED ORDER — HYDROCODONE-HOMATROPINE 5-1.5 MG/5ML PO SYRP: ORAL_SOLUTION | ORAL | 0 refills | Status: DC

## 2017-10-31 MED ORDER — AZITHROMYCIN 250 MG PO TABS: ORAL_TABLET | ORAL | 0 refills | Status: DC

## 2017-10-31 MED ORDER — CIPROFLOXACIN HCL 0.3 % OP SOLN: 1.0000 [drp] | OPHTHALMIC | 0 refills | Status: DC

## 2017-10-31 NOTE — Patient Instructions (Addendum)
Start antibiotic, continue tessalon or mucinex during the day for cough.  Hydrocodone cough syrup if needed at night. Eye drops every 4 hours. Recheck in next 3-4 days if not improving, sooner if worse.   Return to the clinic or go to the nearest emergency room if any of your symptoms worsen or new symptoms occur.   Cough, Adult Coughing is a reflex that clears your throat and your airways. Coughing helps to heal and protect your lungs. It is normal to cough occasionally, but a cough that happens with other symptoms or lasts a long time may be a sign of a condition that needs treatment. A cough may last only 2-3 weeks (acute), or it may last longer than 8 weeks (chronic). What are the causes? Coughing is commonly caused by:  Breathing in substances that irritate your lungs.  A viral or bacterial respiratory infection.  Allergies.  Asthma.  Postnasal drip.  Smoking.  Acid backing up from the stomach into the esophagus (gastroesophageal reflux).  Certain medicines.  Chronic lung problems, including COPD (or rarely, lung cancer).  Other medical conditions such as heart failure.  Follow these instructions at home: Pay attention to any changes in your symptoms. Take these actions to help with your discomfort:  Take medicines only as told by your health care provider. ? If you were prescribed an antibiotic medicine, take it as told by your health care provider. Do not stop taking the antibiotic even if you start to feel better. ? Talk with your health care provider before you take a cough suppressant medicine.  Drink enough fluid to keep your urine clear or pale yellow.  If the air is dry, use a cold steam vaporizer or humidifier in your bedroom or your home to help loosen secretions.  Avoid anything that causes you to cough at work or at home.  If your cough is worse at night, try sleeping in a semi-upright position.  Avoid cigarette smoke. If you smoke, quit smoking. If you  need help quitting, ask your health care provider.  Avoid caffeine.  Avoid alcohol.  Rest as needed.  Contact a health care provider if:  You have new symptoms.  You cough up pus.  Your cough does not get better after 2-3 weeks, or your cough gets worse.  You cannot control your cough with suppressant medicines and you are losing sleep.  You develop pain that is getting worse or pain that is not controlled with pain medicines.  You have a fever.  You have unexplained weight loss.  You have night sweats. Get help right away if:  You cough up blood.  You have difficulty breathing.  Your heartbeat is very fast. This information is not intended to replace advice given to you by your health care provider. Make sure you discuss any questions you have with your health care provider. Document Released: 09/21/2010 Document Revised: 08/31/2015 Document Reviewed: 06/01/2014 Elsevier Interactive Patient Education  2018 [employer].    IF you received an x-ray today, you will receive an invoice from [facility]. Please contact [facility] at [phone] with questions or concerns regarding your invoice.   IF you received labwork today, you will receive an invoice from [facility]. Please contact [facility] at [phone] with questions or concerns regarding your invoice.   Our billing staff will not be able to assist you with questions regarding bills from these companies.  You will be contacted with the lab results as soon as they are available. The fastest  way to get your results is to activate your My Chart account. Instructions are located on the last page of this paperwork. If you have not heard from [place] regarding the results in 2 weeks, please contact this office.

## 2017-10-31 NOTE — Progress Notes (Signed)
Subjective:  By signing my name below, I, [name], attest that this documentation has been prepared under the direction and in the presence of [name], MD Electronically Signed: [name], ED Scribe 10/31/2017 at 8:42 AM.   Patient ID: [name], male    DOB: May 04, 1933, 84 y.o.   MRN: [id number]  Chief Complaint  Patient presents with  . Sore Throat  . Cough   HPI [name] is a 84 y.o. male who presents to Primary Care at [facility] complaining of sore throat and cough. Former smoker. H/o allergic rhinitis, CAD, HTN, DM. Seen 4 days ago, CXR without active disease, neg rapid strep and throat culture. Symptoms x 5 days at that time including cough, green phlegm. Had been treating with Mucinex. Reported that he quit smoking 5 months prior after 1/2 ppd x 10 yrs prev. Diagnosed with suspected viral URI; treated with tessalon, Astelin nasal spray and mucinex.  Pt has been compliant with all meds but states he still has persistent productive cough with phelgm that is now yellow. Reports cough keeps him from sleeping. Also reports yellow eye redness, eye discharge with crusting in both eyes x 2 days, sore throat, rhinorrhea, hoarseness 4 days ago that has resolved. Denies fever, sinus pain/pressure. No known sick contacts but states he was at a family reunion a few days prior to symptoms.  Patient Active Problem List   Diagnosis Date Noted  . Rib pain on left side 07/01/2016  . Fall 12/14/2015  . Elevated serum creatinine 12/14/2015  . Ileus ([place]) 12/14/2015  . Multiple fractures of ribs of left side 12/12/2015  . Prostate cancer ([place]) 05/26/2014  . Blood in the urine 12/01/2013  . Cystitis, radiation 12/01/2013  . Hematuria 12/01/2013  . 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 Malignant neoplasm of prostate ([place]) 11/26/2013  . Male erectile dysfunction 11/26/2013  . Pulsatile tinnitus 11/19/2013  . Rectal bleeding 12/23/2012  . Type II or unspecified type  diabetes mellitus without mention of complication, not stated as uncontrolled   . Essential hypertension, benign   . Chronic back pain   . ED (erectile dysfunction)   . Anemia   . Hyperlipidemia   . CAD, NATIVE VESSEL 05/08/2008  . VENTRICULAR TACHYCARDIA 05/08/2008  . PREMATURE VENTRICULAR CONTRACTIONS 05/08/2008   Past Medical History:  Diagnosis Date  . Allergy   . CAD (coronary artery disease)    pci to LAD 10/09; stable CAD by cath 05/31/08; myoview 04/11/11- no ishcemia, EF 67%; echo 05/15/09- EF>55%, mod calcification of the aortic valve leaflets  . Chronic back pain   . ED (erectile dysfunction)   . Essential hypertension, benign   . Hyperlipidemia   . Multiple rib fractures    left 9th, 10th, and 11th posterior rib fractures S/P fall 12/09/2015/notes 12/12/2015  . Prostate cancer ([place])    s/p  . Type II or unspecified type diabetes mellitus without mention of complication, not stated as uncontrolled   . Ventricular tachycardia ([place])    resuscitated; monitor 05/2008; attempted T ablation 2/10- aborted due to inappropriate substrate   Past Surgical History:  Procedure Laterality Date  . CARDIAC CATHETERIZATION  05/31/08   EF 55-60%, stable lesions, medical therapy  . COLECTOMY  '80's   polyps  . CORONARY ANGIOPLASTY WITH STENT PLACEMENT  01/19/08   PCI stent to mid LAD with Promus DES 27.5x28  . IR GENERIC HISTORICAL  01/03/2016   IR THORACENTESIS ASP PLEURAL SPACE W/IMG GUIDE 01/03/2016 [facility]-INTERV RAD  .  PTCA  01/2008   Allergies  Allergen Reactions  . Contrast Media [Iodinated Diagnostic Agents] Swelling    Possible neck swelling after contrast for chest CT 03/2016. No hives, throat or other symptoms   Prior to Admission medications   Medication Sig Start Date End Date Taking? Authorizing Provider  amLODipine (NORVASC) 2.5 MG tablet Take 2 tablets (5 mg total) by mouth daily. 08/07/17   [name], MD  atorvastatin (LIPITOR) 10 MG tablet Take 1 tablet (10 mg total) by  mouth daily. 08/07/17   [name], MD  azelastine (ASTELIN) 0.1 % nasal spray Place 2 sprays into both nostrils 2 (two) times daily. Use in each nostril as directed 10/27/17   Tenna Delaine D, PA-C  benzonatate (TESSALON) 100 MG capsule Take 1-2 capsules (100-200 mg total) by mouth 3 (three) times daily as needed for cough. 10/27/17   Tenna Delaine D, PA-C  Blood Glucose Monitoring Suppl (ACCU-CHEK AVIVA PLUS) w/Device KIT Test blood sugar 3 times daily. Dx code: E11.22 04/03/16   [name], MD  busPIRone (BUSPAR) 5 MG tablet Take 0.5-2 tablets (2.5-10 mg total) by mouth 3 (three) times daily. 07/24/16   Tereasa Coop, PA-C  diclofenac (FLECTOR) 1.3 % PTCH Place 1 patch onto the skin 2 (two) times daily. 01/29/17   Leandrew Koyanagi, MD  ferrous sulfate 325 (65 FE) MG tablet Take 325 mg by mouth daily with breakfast.    [provider]  GAVILYTE-G 236 g solution  05/24/16   [provider]  glucose blood (ACCU-CHEK AVIVA PLUS) test strip Test blood sugar 3 times daily. Dx code: E11.22 04/03/16   [name], MD  Guaifenesin [facility] MAXIMUM STRENGTH) 1200 MG TB12 Take 1 tablet (1,200 mg total) by mouth every 12 (twelve) hours as needed. 10/27/17   Tenna Delaine D, PA-C  HYDROcodone-acetaminophen (NORCO/VICODIN) 5-325 MG tablet Take 1 tablet by mouth every 6 (six) hours as needed for moderate pain. 04/10/17   [name], MD  Insulin Syringe-Needle U-100 (B-D INS SYR HALF-UNIT .3CC/31G) 31G X 5/16" 0.3 ML MISC Use daily at bedtime to inject insulin. Dx code: 250.00 06/21/13   [name], MD  Lancets (ACCU-CHEK MULTICLIX) lancets Test blood sugar 3 times daily. Dx code: E11.22 04/03/16   [name], MD  lisinopril (PRINIVIL,ZESTRIL) 20 MG tablet Take 1 tablet (20 mg total) by mouth daily. 08/07/17   [name], MD  lisinopril-hydrochlorothiazide (PRINZIDE,ZESTORETIC) 20-25 MG tablet Take 1 tablet by mouth daily. 08/07/17   [name],  MD  metFORMIN (GLUCOPHAGE) 1000 MG tablet Take 1 tablet (1,000 mg total) by mouth 2 (two) times daily with a meal. 08/07/17   [name], MD  metoprolol tartrate (LOPRESSOR) 50 MG tablet Take 1 tablet (50 mg total) by mouth 2 (two) times daily. 08/07/17   [name], MD  Multiple Vitamin (MULTIVITAMIN) tablet Take 1 tablet by mouth daily.    [provider]  NOVOLIN 70/30 (70-30) 100 UNIT/ML injection ADMINISTER 8 UNITS UNDER THE SKIN TWICE DAILY WITH A MEAL 10/13/17   [name], MD  Omega-3 Fatty Acids (FISH OIL) 1000 MG CAPS Take 2 capsules by mouth daily.     [provider]  sildenafil (VIAGRA) 50 MG tablet Take 1 tablet (50 mg total) by mouth daily as needed for erectile dysfunction. 02/08/15   Lorretta Harp, MD  varenicline (CHANTIX CONTINUING MONTH PAK) 1 MG tablet Take 1 tablet (1 mg total) by  mouth 2 (two) times daily. 04/10/17   [name], MD  varenicline (CHANTIX STARTING MONTH PAK) 0.5 MG X 11 & 1 MG X 42 tablet Take one 0.5 mg tablet by mouth once daily for 3 days, then increase to one 0.5 mg tablet twice daily for 4 days, then increase to one 1 mg tablet twice daily. 04/10/17   [name], MD  Vitamin D, Cholecalciferol, 1000 UNITS TABS Take 1 tablet by mouth daily.     [provider]  VOLTAREN 1 % GEL  06/08/16   [provider]   Social History   Socioeconomic History  . Marital status: Legally Separated    Spouse name: Not on file  . Number of children: 7  . Years of education: Not on file  . Highest education level: Not on file  Occupational History  . Occupation: RETIRED    Employer: RETIRED  Social Needs  . Financial resource strain: Not on file  . Food insecurity:    Worry: Not on file    Inability: Not on file  . Transportation needs:    Medical: Not on file    Non-medical: Not on file  Tobacco Use  . Smoking status: Former Smoker    Packs/day: 0.00    Years: 15.00    Pack years: 0.00    Types:  Cigarettes    Last attempt to quit: 05/10/2017    Years since quitting: 0.4  . Smokeless tobacco: Never Used  . Tobacco comment: Patient smokes occasionally. Not everyday.  Substance and Sexual Activity  . Alcohol use: No  . Drug use: No  . Sexual activity: Never  Lifestyle  . Physical activity:    Days per week: Not on file    Minutes per session: Not on file  . Stress: Not on file  Relationships  . Social connections:    Talks on phone: Not on file    Gets together: Not on file    Attends religious service: Not on file    Active member of club or organization: Not on file    Attends meetings of clubs or organizations: Not on file    Relationship status: Not on file  . Intimate partner violence:    Fear of current or ex partner: Not on file    Emotionally abused: Not on file    Physically abused: Not on file    Forced sexual activity: Not on file  Other Topics Concern  . Not on file  Social History Narrative   29 grandchildren. Single. Education: 12 th grade. Exercise: 3-4 times a week for 30 minutes working out and set ups.   Review of Systems  Constitutional: Negative for fever.  HENT: Positive for rhinorrhea, sore throat and voice change. Negative for sinus pressure and sinus pain.   Eyes: Positive for discharge (improved) and redness.  Respiratory: Positive for cough.       Objective:   Physical Exam  Constitutional: He is oriented to person, place, and time. He appears well-developed and well-nourished.  HENT:  Head: Normocephalic and atraumatic.  Right Ear: Tympanic membrane, external ear and ear canal normal.  Left Ear: Tympanic membrane, external ear and ear canal normal.  Nose: No rhinorrhea. Right sinus exhibits no maxillary sinus tenderness and no frontal sinus tenderness. Left sinus exhibits no maxillary sinus tenderness and no frontal sinus tenderness.  Mouth/Throat: Oropharynx is clear and moist and mucous membranes are normal. No oropharyngeal exudate or  posterior oropharyngeal erythema.  Eyes: Pupils are equal, round, and reactive to light. Right eye exhibits exudate. Left eye exhibits exudate.  Small amount of exudate at lateral canthi bilaterally with scleral injection  Neck: Neck supple.  Cardiovascular: Normal rate, regular rhythm, normal heart sounds and intact distal pulses.  No murmur heard. Pulmonary/Chest: Effort normal and breath sounds normal. He has no wheezes. He has no rhonchi. He has no rales.  Abdominal: Soft. There is no tenderness.  Lymphadenopathy:    He has no cervical adenopathy.  Neurological: He is alert and oriented to person, place, and time.  Skin: Skin is warm and dry. No rash noted.  Psychiatric: He has a normal mood and affect. His behavior is normal.  Nursing note and vitals reviewed.  Vitals:   10/31/17 0816  BP: 135/69  Pulse: 73  Resp: 17  Temp: 99 F (37.2 C)  TempSrc: Oral  SpO2: 98%  Weight: 227 lb (103 kg)  Height: 5' 10"  (1.778 m)      Assessment & Plan:    [name] is a 84 y.o. male LRTI (lower respiratory tract infection) - Plan: azithromycin (ZITHROMAX) 250 MG tablet, HYDROcodone-homatropine (HYCODAN) 5-1.5 MG/5ML syrup  Cough - Plan: azithromycin (ZITHROMAX) 250 MG tablet, HYDROcodone-homatropine (HYCODAN) 5-1.5 MG/5ML syrup  Acute conjunctivitis of both eyes, unspecified acute conjunctivitis type - Plan: ciprofloxacin (CILOXAN) 0.3 % ophthalmic solution  Possible initial viral URI, now with persistent discolored phlegm, and bilateral conjunctivitis. Possible secondary bacterial conjunctivitis and lower respiratory infection.  Lungs clear, vitals overall reassuring.   -start zpak, continue tessalon and mucinex, hycodan at night if needed. Side effects discussed.   -ciloxan gtts for eyes.    - rtc precautions given.   Meds ordered this encounter  Medications  . azithromycin (ZITHROMAX) 250 MG tablet    Sig: Take 2 pills by mouth on day 1, then 1 pill by mouth per day on  days 2 through 5.    Dispense:  6 tablet    Refill:  0  . HYDROcodone-homatropine (HYCODAN) 5-1.5 MG/5ML syrup    Sig: 45mby mouth a bedtime as needed for cough.    Dispense:  120 mL    Refill:  0  . ciprofloxacin (CILOXAN) 0.3 % ophthalmic solution    Sig: Place 1 drop into both eyes every 4 (four) hours while awake.    Dispense:  10 mL    Refill:  0   Patient Instructions    Start antibiotic, continue tessalon or mucinex during the day for cough.  Hydrocodone cough syrup if needed at night. Eye drops every 4 hours. Recheck in next 3-4 days if not improving, sooner if worse.   Return to the clinic or go to the nearest emergency room if any of your symptoms worsen or new symptoms occur.   Cough, Adult Coughing is a reflex that clears your throat and your airways. Coughing helps to heal and protect your lungs. It is normal to cough occasionally, but a cough that happens with other symptoms or lasts a long time may be a sign of a condition that needs treatment. A cough may last only 2-3 weeks (acute), or it may last longer than 8 weeks (chronic). What are the causes? Coughing is commonly caused by:  Breathing in substances that irritate your lungs.  A viral or bacterial respiratory infection.  Allergies.  Asthma.  Postnasal drip.  Smoking.  Acid backing up from the stomach into the esophagus (gastroesophageal reflux).  Certain medicines.  Chronic lung problems, including  COPD (or rarely, lung cancer).  Other medical conditions such as heart failure.  Follow these instructions at home: Pay attention to any changes in your symptoms. Take these actions to help with your discomfort:  Take medicines only as told by your health care provider. ? If you were prescribed an antibiotic medicine, take it as told by your health care provider. Do not stop taking the antibiotic even if you start to feel better. ? Talk with your health care provider before you take a cough suppressant  medicine.  Drink enough fluid to keep your urine clear or pale yellow.  If the air is dry, use a cold steam vaporizer or humidifier in your bedroom or your home to help loosen secretions.  Avoid anything that causes you to cough at work or at home.  If your cough is worse at night, try sleeping in a semi-upright position.  Avoid cigarette smoke. If you smoke, quit smoking. If you need help quitting, ask your health care provider.  Avoid caffeine.  Avoid alcohol.  Rest as needed.  Contact a health care provider if:  You have new symptoms.  You cough up pus.  Your cough does not get better after 2-3 weeks, or your cough gets worse.  You cannot control your cough with suppressant medicines and you are losing sleep.  You develop pain that is getting worse or pain that is not controlled with pain medicines.  You have a fever.  You have unexplained weight loss.  You have night sweats. Get help right away if:  You cough up blood.  You have difficulty breathing.  Your heartbeat is very fast. This information is not intended to replace advice given to you by your health care provider. Make sure you discuss any questions you have with your health care provider. Document Released: 09/21/2010 Document Revised: 08/31/2015 Document Reviewed: 06/01/2014 Elsevier Interactive Patient Education  2018 [employer].    IF you received an x-ray today, you will receive an invoice from [facility]. Please contact [facility] at [phone] with questions or concerns regarding your invoice.   IF you received labwork today, you will receive an invoice from [place]. Please contact [facility] at [phone] with questions or concerns regarding your invoice.   Our billing staff will not be able to assist you with questions regarding bills from these companies.  You will be contacted with the lab results as soon as they are available. The fastest way to get your results  is to activate your My Chart account. Instructions are located on the last page of this paperwork. If you have not heard from [place] regarding the results in 2 weeks, please contact this office.       I personally performed the services described in this documentation, which was scribed in my presence. The recorded information has been reviewed and considered for accuracy and completeness, addended by me as needed, and agree with information above.  Signed,   [name], MD [facility] at [place].  10/31/17 8:59 AM

## 2017-11-11 ENCOUNTER — Ambulatory Visit (INDEPENDENT_AMBULATORY_CARE_PROVIDER_SITE_OTHER): Payer: Medicare HMO | Admitting: Family Medicine

## 2017-11-11 ENCOUNTER — Encounter: Payer: Self-pay | Admitting: Family Medicine

## 2017-11-11 ENCOUNTER — Other Ambulatory Visit: Payer: Self-pay

## 2017-11-11 VITALS — BP 138/66 | HR 82 | Temp 97.6°F | Ht 72.0 in | Wt 228.0 lb

## 2017-11-11 DIAGNOSIS — N189 Chronic kidney disease, unspecified: Secondary | ICD-10-CM | POA: Diagnosis not present

## 2017-11-11 DIAGNOSIS — E1129 Type 2 diabetes mellitus with other diabetic kidney complication: Secondary | ICD-10-CM

## 2017-11-11 DIAGNOSIS — Z794 Long term (current) use of insulin: Secondary | ICD-10-CM | POA: Diagnosis not present

## 2017-11-11 DIAGNOSIS — R809 Proteinuria, unspecified: Secondary | ICD-10-CM

## 2017-11-11 DIAGNOSIS — I10 Essential (primary) hypertension: Secondary | ICD-10-CM | POA: Diagnosis not present

## 2017-11-11 DIAGNOSIS — E785 Hyperlipidemia, unspecified: Secondary | ICD-10-CM

## 2017-11-11 MED ORDER — ATORVASTATIN CALCIUM 10 MG PO TABS: 10.0000 mg | ORAL_TABLET | Freq: Every day | ORAL | 1 refills | Status: DC

## 2017-11-11 MED ORDER — METFORMIN HCL 1000 MG PO TABS: 1000.0000 mg | ORAL_TABLET | Freq: Two times a day (BID) | ORAL | 1 refills | Status: DC

## 2017-11-11 MED ORDER — METOPROLOL TARTRATE 50 MG PO TABS: 50.0000 mg | ORAL_TABLET | Freq: Two times a day (BID) | ORAL | 1 refills | Status: DC

## 2017-11-11 MED ORDER — INSULIN NPH ISOPHANE & REGULAR (70-30) 100 UNIT/ML ~~LOC~~ SUSP: SUBCUTANEOUS | 2 refills | Status: DC

## 2017-11-11 MED ORDER — AMLODIPINE BESYLATE 5 MG PO TABS: 5.0000 mg | ORAL_TABLET | Freq: Every day | ORAL | 1 refills | Status: DC

## 2017-11-11 MED ORDER — LISINOPRIL-HYDROCHLOROTHIAZIDE 20-25 MG PO TABS: 1.0000 | ORAL_TABLET | Freq: Every day | ORAL | 1 refills | Status: DC

## 2017-11-11 NOTE — Patient Instructions (Addendum)
  No change in medication doses for now.  I will check your hemoglobin A1c as well as electrolytes.  Follow-up in 3 months and at that time can check fasting labs including your cholesterol.  Thank you for coming in today.  Let me know if there are questions in the meantime.   IF you received an x-ray today, you will receive an invoice from [facility]. Please contact [facility] at [phone] with questions or concerns regarding your invoice.   IF you received labwork today, you will receive an invoice from [place]. Please contact [facility] at [phone] with questions or concerns regarding your invoice.   Our billing staff will not be able to assist you with questions regarding bills from these companies.  You will be contacted with the lab results as soon as they are available. The fastest way to get your results is to activate your My Chart account. Instructions are located on the last page of this paperwork. If you have not heard from [place] regarding the results in 2 weeks, please contact this office.

## 2017-11-11 NOTE — Progress Notes (Signed)
Subjective:    Patient ID: [name], male    DOB: 1933-07-04, 84 y.o.   MRN: [id number]  HPI [name] is a 84 y.o. male Presents today for: Chief Complaint  Patient presents with  . Chronic Conditions    3 month f/u    Diabetes: Lab Results  Component Value Date   HGBA1C 7.3 (H) 08/07/2017   Wt Readings from Last 3 Encounters:  11/11/17 228 lb (103.4 kg)  10/31/17 227 lb (103 kg)  10/27/17 227 lb 3.2 oz (103.1 kg)  He takes metformin 1000 mg twice daily, and 8 units of 70/30 insulin twice per day. No missed doses, but some issues with insulin lately trying to obtain meds, but not missing doses.  Complicated by chronic kidney disease.  Creatinine range of 1.36-1.59 over the past 1 year.  Most recently 1.47 in May.  Urine microalbumin: Elevated at 1066.8 on May 2 Optho: 07/17/2017 Foot exam: 08/07/2017 Statin -Lipitor 10 mg daily Ace or ARB -lisinopril/HCTZ  Hypertension: BP Readings from Last 3 Encounters:  11/11/17 138/66  10/31/17 135/69  10/27/17 121/74   Lab Results  Component Value Date   CREATININE 1.47 (H) 08/07/2017  Takes lisinopril HCTZ 20/25 mg daily, metoprolol 50 mg twice daily, amlodipine 5 mg daily. No new med side effects.   Hyperlipidemia:  Lab Results  Component Value Date   CHOL 145 08/07/2017   HDL 43 08/07/2017   LDLCALC 73 08/07/2017   TRIG 144 08/07/2017   CHOLHDL 3.4 08/07/2017   Lab Results  Component Value Date   ALT 8 08/07/2017   AST 11 08/07/2017   ALKPHOS 41 08/07/2017   BILITOT 0.4 08/07/2017  He takes Lipitor 10 mg daily.  Near goal of less than 70 when checked in May. No new myalgias.   Plans on following up with ortho for knee pain and possible replacement.      Patient Active Problem List   Diagnosis Date Noted  . Rib pain on left side 07/01/2016  . Fall 12/14/2015  . Elevated serum creatinine 12/14/2015  . Ileus ([place]) 12/14/2015  . Multiple fractures of ribs of left side 12/12/2015  . Prostate  cancer ([place]) 05/26/2014  . Blood in the urine 12/01/2013  . Cystitis, radiation 12/01/2013  . Hematuria 12/01/2013  . 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 Malignant neoplasm of prostate ([place]) 11/26/2013  . Male erectile dysfunction 11/26/2013  . Pulsatile tinnitus 11/19/2013  . Rectal bleeding 12/23/2012  . Type II or unspecified type diabetes mellitus without mention of complication, not stated as uncontrolled   . Essential hypertension, benign   . Chronic back pain   . ED (erectile dysfunction)   . Anemia   . Hyperlipidemia   . CAD, NATIVE VESSEL 05/08/2008  . VENTRICULAR TACHYCARDIA 05/08/2008  . PREMATURE VENTRICULAR CONTRACTIONS 05/08/2008   Past Medical History:  Diagnosis Date  . Allergy   . CAD (coronary artery disease)    pci to LAD 10/09; stable CAD by cath 05/31/08; myoview 04/11/11- no ishcemia, EF 67%; echo 05/15/09- EF>55%, mod calcification of the aortic valve leaflets  . Chronic back pain   . ED (erectile dysfunction)   . Essential hypertension, benign   . Hyperlipidemia   . Multiple rib fractures    left 9th, 10th, and 11th posterior rib fractures S/P fall 12/09/2015/notes 12/12/2015  . Prostate cancer ([place])    s/p  . Type II or unspecified type diabetes mellitus without mention of complication, not stated as uncontrolled   .  Ventricular tachycardia ([place])    resuscitated; monitor 05/2008; attempted T ablation 2/10- aborted due to inappropriate substrate   Past Surgical History:  Procedure Laterality Date  . CARDIAC CATHETERIZATION  05/31/08   EF 55-60%, stable lesions, medical therapy  . COLECTOMY  '80's   polyps  . CORONARY ANGIOPLASTY WITH STENT PLACEMENT  01/19/08   PCI stent to mid LAD with Promus DES 27.5x28  . IR GENERIC HISTORICAL  01/03/2016   IR THORACENTESIS ASP PLEURAL SPACE W/IMG GUIDE 01/03/2016 [facility]  . PTCA  01/2008   Allergies  Allergen Reactions  . Contrast Media [Iodinated Diagnostic Agents] Swelling    Possible neck swelling after  contrast for chest CT 03/2016. No hives, throat or other symptoms   Prior to Admission medications   Medication Sig Start Date End Date Taking? Authorizing Provider  amLODipine (NORVASC) 2.5 MG tablet Take 2 tablets (5 mg total) by mouth daily. 08/07/17  Yes [name], MD  atorvastatin (LIPITOR) 10 MG tablet Take 1 tablet (10 mg total) by mouth daily. 08/07/17  Yes [name], MD  azelastine (ASTELIN) 0.1 % nasal spray Place 2 sprays into both nostrils 2 (two) times daily. Use in each nostril as directed 10/27/17  Yes Timmothy Euler, [place] D, PA-C  Blood Glucose Monitoring Suppl (ACCU-CHEK AVIVA PLUS) w/Device KIT Test blood sugar 3 times daily. Dx code: E11.22 04/03/16  Yes [name], MD  busPIRone (BUSPAR) 5 MG tablet Take 0.5-2 tablets (2.5-10 mg total) by mouth 3 (three) times daily. 07/24/16  Yes Tereasa Coop, PA-C  ciprofloxacin (CILOXAN) 0.3 % ophthalmic solution Place 1 drop into both eyes every 4 (four) hours while awake. 10/31/17  Yes [name], MD  diclofenac (FLECTOR) 1.3 % PTCH Place 1 patch onto the skin 2 (two) times daily. 01/29/17  Yes Leandrew Koyanagi, MD  ferrous sulfate 325 (65 FE) MG tablet Take 325 mg by mouth daily with breakfast.   Yes [provider]  GAVILYTE-G 236 g solution  05/24/16  Yes [provider]  glucose blood (ACCU-CHEK AVIVA PLUS) test strip Test blood sugar 3 times daily. Dx code: E11.22 04/03/16  Yes [name], MD  Guaifenesin [facility] MAXIMUM STRENGTH) 1200 MG TB12 Take 1 tablet (1,200 mg total) by mouth every 12 (twelve) hours as needed. 10/27/17  Yes Timmothy Euler, [place] D, PA-C  Insulin Syringe-Needle U-100 (B-D INS SYR HALF-UNIT .3CC/31G) 31G X 5/16" 0.3 ML MISC Use daily at bedtime to inject insulin. Dx code: 250.00 06/21/13  Yes [name], MD  Lancets (ACCU-CHEK MULTICLIX) lancets Test blood sugar 3 times daily. Dx code: E11.22 04/03/16  Yes [name], MD  lisinopril-hydrochlorothiazide  (PRINZIDE,ZESTORETIC) 20-25 MG tablet Take 1 tablet by mouth daily. 08/07/17  Yes [name], MD  metFORMIN (GLUCOPHAGE) 1000 MG tablet Take 1 tablet (1,000 mg total) by mouth 2 (two) times daily with a meal. 08/07/17  Yes [name], MD  metoprolol tartrate (LOPRESSOR) 50 MG tablet Take 1 tablet (50 mg total) by mouth 2 (two) times daily. 08/07/17  Yes [name], MD  Multiple Vitamin (MULTIVITAMIN) tablet Take 1 tablet by mouth daily. COMPLETE SENIOR VITAMINS AND M   Yes [provider]  NOVOLIN 70/30 (70-30) 100 UNIT/ML injection ADMINISTER 8 UNITS UNDER THE SKIN TWICE DAILY WITH A MEAL 10/13/17  Yes [name], MD  Omega-3 Fatty Acids (FISH OIL) 1000 MG CAPS Take 2 capsules by mouth daily.    Yes [provider]  Vitamin D, Cholecalciferol, 1000 UNITS TABS Take 1 tablet by mouth daily.    Yes [provider]  VOLTAREN 1 % GEL  06/08/16  Yes [provider]   Social History   Socioeconomic History  . Marital status: Legally Separated    Spouse name: Not on file  . Number of children: 7  . Years of education: Not on file  . Highest education level: Not on file  Occupational History  . Occupation: RETIRED    Employer: RETIRED  Social Needs  . Financial resource strain: Not on file  . Food insecurity:    Worry: Not on file    Inability: Not on file  . Transportation needs:    Medical: Not on file    Non-medical: Not on file  Tobacco Use  . Smoking status: Former Smoker    Packs/day: 0.00    Years: 15.00    Pack years: 0.00    Types: Cigarettes    Last attempt to quit: 05/10/2017    Years since quitting: 0.5  . Smokeless tobacco: Never Used  . Tobacco comment: Patient smokes occasionally. Not everyday.  Substance and Sexual Activity  . Alcohol use: No  . Drug use: No  . Sexual activity: Never  Lifestyle  . Physical activity:    Days per week: Not on file    Minutes per session: Not on file  . Stress: Not on file    Relationships  . Social connections:    Talks on phone: Not on file    Gets together: Not on file    Attends religious service: Not on file    Active member of club or organization: Not on file    Attends meetings of clubs or organizations: Not on file    Relationship status: Not on file  . Intimate partner violence:    Fear of current or ex partner: Not on file    Emotionally abused: Not on file    Physically abused: Not on file    Forced sexual activity: Not on file  Other Topics Concern  . Not on file  Social History Narrative   29 grandchildren. Single. Education: 12 th grade. Exercise: 3-4 times a week for 30 minutes working out and set ups.    Review of Systems  Constitutional: Negative for fatigue and unexpected weight change.  Eyes: Negative for visual disturbance.  Respiratory: Negative for cough, chest tightness and shortness of breath.   Cardiovascular: Negative for chest pain, palpitations and leg swelling.  Gastrointestinal: Negative for abdominal pain and blood in stool.  Neurological: Negative for dizziness, light-headedness and headaches.       Objective:   Physical Exam  Constitutional: He is oriented to person, place, and time. He appears well-developed and well-nourished.  HENT:  Head: Normocephalic and atraumatic.  Eyes: Pupils are equal, round, and reactive to light. EOM are normal.  Neck: No JVD present. Carotid bruit is not present.  Cardiovascular: Normal rate, regular rhythm and normal heart sounds.  No murmur heard. Pulmonary/Chest: Effort normal and breath sounds normal. He has no rales.  Musculoskeletal: He exhibits no edema.  Neurological: He is alert and oriented to person, place, and time.  Skin: Skin is warm and dry.  Psychiatric: He has a normal mood and affect.  Vitals reviewed.   Vitals:   11/11/17 0824  BP: 138/66  Pulse: 82  Temp: 97.6 F (36.4 C)  TempSrc: Oral  SpO2: 97%  Weight: 228 lb (103.4 kg)  Height: 6' (  1.829 m)       Assessment & Plan:   [name] is a 84 y.o. male Essential hypertension - Plan: Basic metabolic panel, metoprolol tartrate (LOPRESSOR) 50 MG tablet, lisinopril-hydrochlorothiazide (PRINZIDE,ZESTORETIC) 20-25 MG tablet, amLODipine (NORVASC) 5 MG tablet  -  Stable, tolerating current regimen. Medications refilled -adjusted dose of amlodipine to 5 mg pill as had been taking 2 of the 2.98m. Labs pending as above.   Type 2 diabetes mellitus with microalbuminuria, with long-term current use of insulin (HCC) - Plan: Basic metabolic panel, Hemoglobin A1c, insulin NPH-regular Human (NOVOLIN 70/30) (70-30) 100 UNIT/ML injection, metFORMIN (GLUCOPHAGE) 1000 MG tablet  -Overall stable with last A1c of 7.3.  Based on age and use of insulin, under 7.5 may be a reasonable goal for his A1c.  Denies hypoglycemia or any side effects with medications.  -Continue ACE inhibitor for microalbuminuria, chronic kidney disease with overall stable creatinine.  Will repeat BMP  Hyperlipidemia, unspecified hyperlipidemia type - Plan: atorvastatin (LIPITOR) 10 MG tablet  -Tolerating Lipitor, plan for fasting labs next visit.   Meds ordered this encounter  Medications  . insulin NPH-regular Human (NOVOLIN 70/30) (70-30) 100 UNIT/ML injection    Sig: ADMINISTER 8 UNITS UNDER THE SKIN TWICE DAILY WITH A MEAL    Dispense:  15 mL    Refill:  2  . metoprolol tartrate (LOPRESSOR) 50 MG tablet    Sig: Take 1 tablet (50 mg total) by mouth 2 (two) times daily.    Dispense:  180 tablet    Refill:  1  . metFORMIN (GLUCOPHAGE) 1000 MG tablet    Sig: Take 1 tablet (1,000 mg total) by mouth 2 (two) times daily with a meal.    Dispense:  180 tablet    Refill:  1  . lisinopril-hydrochlorothiazide (PRINZIDE,ZESTORETIC) 20-25 MG tablet    Sig: Take 1 tablet by mouth daily.    Dispense:  90 tablet    Refill:  1  . atorvastatin (LIPITOR) 10 MG tablet    Sig: Take 1 tablet (10 mg total) by mouth daily.    Dispense:  90  tablet    Refill:  1  . amLODipine (NORVASC) 5 MG tablet    Sig: Take 1 tablet (5 mg total) by mouth daily.    Dispense:  90 tablet    Refill:  1   Patient Instructions    No change in medication doses for now.  I will check your hemoglobin A1c as well as electrolytes.  Follow-up in 3 months and at that time can check fasting labs including your cholesterol.  Thank you for coming in today.  Let me know if there are questions in the meantime.   IF you received an x-ray today, you will [facility]. Please contact [facility] at [phone] questions or concerns regarding your invoice.   IF you received labwork today, you will receive an invoice from [facility] Please contact [facility] at [phone] questions or concerns regarding your invoice.   Our billing staff will not be able to assist you with questions regarding bills from these companies.  You will be contacted with the lab results as soon as they are available. The fastest way to get your results is to activate your My Chart account. Instructions are located on the last page of this paperwork. If you have not heard from [place] the results in 2 weeks, please contact this office.       Signed,  [name], MD [facility].  11/11/17 9:14 AM

## 2017-11-12 LAB — BASIC METABOLIC PANEL
BUN/Creatinine Ratio: 16 (ref 10–24)
BUN: 25 mg/dL (ref 8–27)
CO2: 19 mmol/L — ABNORMAL LOW (ref 20–29)
Calcium: 9.4 mg/dL (ref 8.6–10.2)
Chloride: 104 mmol/L (ref 96–106)
Creatinine, Ser: 1.56 mg/dL — ABNORMAL HIGH (ref 0.76–1.27)
GFR calc Af Amer: 47 mL/min/{1.73_m2} — ABNORMAL LOW (ref >59)
GFR calc non Af Amer: 41 mL/min/{1.73_m2} — ABNORMAL LOW (ref >59)
Glucose: 145 mg/dL — ABNORMAL HIGH (ref 65–99)
Potassium: 4.7 mmol/L (ref 3.5–5.2)
Sodium: 141 mmol/L (ref 134–144)

## 2017-11-12 LAB — HEMOGLOBIN A1C
Est. average glucose Bld gHb Est-mCnc: 186 mg/dL
Hgb A1c MFr Bld: 8.1 % — ABNORMAL HIGH (ref 4.8–5.6)

## 2017-12-17 DIAGNOSIS — M25561 Pain in right knee: Secondary | ICD-10-CM | POA: Diagnosis not present

## 2017-12-17 DIAGNOSIS — M25562 Pain in left knee: Secondary | ICD-10-CM | POA: Diagnosis not present

## 2017-12-17 DIAGNOSIS — M1711 Unilateral primary osteoarthritis, right knee: Secondary | ICD-10-CM | POA: Diagnosis not present

## 2017-12-17 DIAGNOSIS — M1712 Unilateral primary osteoarthritis, left knee: Secondary | ICD-10-CM | POA: Diagnosis not present

## 2017-12-23 ENCOUNTER — Telehealth: Payer: Self-pay | Admitting: Family Medicine

## 2017-12-23 ENCOUNTER — Telehealth: Payer: Self-pay | Admitting: *Deleted

## 2017-12-23 DIAGNOSIS — M1712 Unilateral primary osteoarthritis, left knee: Secondary | ICD-10-CM | POA: Diagnosis not present

## 2017-12-23 DIAGNOSIS — M1711 Unilateral primary osteoarthritis, right knee: Secondary | ICD-10-CM | POA: Diagnosis not present

## 2017-12-23 NOTE — Telephone Encounter (Signed)
Received surgical clearance request form from patient (walk in from [facility]).    Request is missing surgical procedure and date of procedure.   Called and left message for patient to request [facility] refax this information in order to review for clearance

## 2017-12-23 NOTE — Telephone Encounter (Signed)
Received form and placed in provider box to be completed. It looks like surgery clearance forms. Please let me know what I can do to assist if needed.

## 2017-12-23 NOTE — Telephone Encounter (Signed)
Pt has brought in paperwork for Dr. Carlota Raspberry to fill out for an up coming surgery for him. I have placed paperwork In Dr. Vonna Kotyk box at 102 in his med refill box.  Please call pt when complete at [phone]

## 2017-12-25 NOTE — Telephone Encounter (Signed)
Please schedule OV for preop clearance. Can review form at that time.

## 2018-01-09 ENCOUNTER — Other Ambulatory Visit: Payer: Self-pay | Admitting: Family Medicine

## 2018-01-09 DIAGNOSIS — I10 Essential (primary) hypertension: Secondary | ICD-10-CM

## 2018-01-12 ENCOUNTER — Encounter

## 2018-01-12 ENCOUNTER — Encounter: Payer: Self-pay | Admitting: Family Medicine

## 2018-01-12 ENCOUNTER — Ambulatory Visit (INDEPENDENT_AMBULATORY_CARE_PROVIDER_SITE_OTHER): Payer: Medicare HMO | Admitting: Family Medicine

## 2018-01-12 ENCOUNTER — Other Ambulatory Visit: Payer: Self-pay

## 2018-01-12 ENCOUNTER — Ambulatory Visit (INDEPENDENT_AMBULATORY_CARE_PROVIDER_SITE_OTHER): Payer: Medicare HMO

## 2018-01-12 VITALS — BP 138/72 | HR 68 | Temp 98.0°F | Resp 16 | Ht 72.0 in | Wt 228.0 lb

## 2018-01-12 DIAGNOSIS — Z794 Long term (current) use of insulin: Secondary | ICD-10-CM | POA: Diagnosis not present

## 2018-01-12 DIAGNOSIS — L989 Disorder of the skin and subcutaneous tissue, unspecified: Secondary | ICD-10-CM

## 2018-01-12 DIAGNOSIS — Z01818 Encounter for other preprocedural examination: Secondary | ICD-10-CM

## 2018-01-12 DIAGNOSIS — E1122 Type 2 diabetes mellitus with diabetic chronic kidney disease: Secondary | ICD-10-CM

## 2018-01-12 DIAGNOSIS — Z23 Encounter for immunization: Secondary | ICD-10-CM

## 2018-01-12 DIAGNOSIS — J984 Other disorders of lung: Secondary | ICD-10-CM | POA: Diagnosis not present

## 2018-01-12 DIAGNOSIS — Z862 Personal history of diseases of the blood and blood-forming organs and certain disorders involving the immune mechanism: Secondary | ICD-10-CM

## 2018-01-12 MED ORDER — CLOTRIMAZOLE-BETAMETHASONE 1-0.05 % EX CREA: 1.0000 "application " | TOPICAL_CREAM | Freq: Two times a day (BID) | CUTANEOUS | 0 refills | Status: DC

## 2018-01-12 NOTE — Telephone Encounter (Signed)
Please review:  Lisinopril/hydrochlorothiazide 20-25 mg   Last refill 11/11/17 for 90 tabs. Request to send to mail order pharmacy.  Elevated creatinine.

## 2018-01-12 NOTE — Patient Instructions (Addendum)
I will check blood sugar with electrolytes and kidney test today. No change in meds for now, but you will not need to take insulin if you are not eating (do not take on morning of surgery, then restart once you restart food intake). Monitor blood sugar 3 times per day initiially after surgery. If any readings over 300 or low readings under 80 - I need to know.  Stay hydrated around time of surgery to help protect from worsening kidney problems.   Once I review your blood work and x-ray results I can work on your paperwork.  Cardiac clearance will need to be performed by Dr. [name].   Rash in the scalp may be due to tinea capitis or a type of ringworm, but I would like to see it again in 1 week as I may need to biopsy that area or refer you to dermatology.  Start the antifungal cream twice per day.  There is also an anti-itch cream in that prescription.  Recheck in 1 week, sooner if any discharge, redness, or worsening symptoms.    Return to the clinic or go to the nearest emergency room if any of your symptoms worsen or new symptoms occur.    If you have lab work done today you will be contacted with your lab results within the next 2 weeks.  If you have not heard from [place] then please contact us. The fastest way to get your results is to register for My Chart.   IF you received an x-ray today, you will receive an invoice from [facility]. Please contact [facility] at [phone] with questions or concerns regarding your invoice.   IF you received labwork today, you will receive an invoice from [facility]. Please contact [facility] at [phone] with questions or concerns regarding your invoice.   Our billing staff will not be able to assist you with questions regarding bills from these companies.  You will be contacted with the lab results as soon as they are available. The fastest way to get your results is to activate your My Chart account. Instructions are located on the  last page of this paperwork. If you have not heard from [place] regarding the results in 2 weeks, please contact this office.

## 2018-01-12 NOTE — Progress Notes (Signed)
Subjective:  By signing my name below, I, [name], attest that this documentation has been prepared under the direction and in the presence of Robert Ray, MD. Electronically Signed: [name], [place]. 01/12/2018 , 4:11 PM .  Patient was seen in Room 11 .   Patient ID: [name], male    DOB: 06-26-1933, 84 y.o.   MRN: [id number] Chief Complaint  Patient presents with  . Surgical Clearance    pt states he will be getting a Knee replacement    HPI [name] is a 84 y.o. male Here for pre op clearance for total knee replacement, orthopedic surgeon Dr. Lyla Acosta. He has a history of diabetes, CAD, prior rib fractures persistent with chest wall pain, prior PVC and vtach, and prostate cancer/bladder cancer.   His surgery isn't scheduled until after the pre op clearances are done, but plans to have both knees done before Christmas. Decided to have right knee replacement first.   Scalp Rash [4:14 PM] Additionally, patient mentions a large patch of itchy rash on his left parietal scalp that comes and goes, first noticed 2-3 weeks ago. He mentions previously had similar patch over the frontal scalp which had resolved.   Health maintenance He received flu shot today.   Diabetes Lab Results  Component Value Date   HGBA1C 8.1 (H) 11/11/2017   He is on insulin 8 units 70/30 twice a day, and metformin 1000 mg bid. His diabetes is complicated by CKD. He denies any symptomatic lows or hypoglycemia.   Lab Results  Component Value Date   CREATININE 1.56 (H) 11/11/2017   Creatinine range of 1.36 to 1.59 when discussed in August. Of note, his GFR was 47 when last checked, and he is still on metformin.   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 Nevada Acosta. Plan for ongoing surveillance with PSA and cystoscopy in 3 months from July visit.   He mentions having some urinary continence; plans to  discuss this with Dr. Nevada Acosta. He denies any dysuria or fever.   History of rib fractures He was able to stop his chronic rib and back pain medication earlier this year when he quit smoking with the use of Chantix.   He denies being on the diclofenac pain patch. He denies using Voltaren gel.   Cardiac His cardiologist is Dr. Gwenlyn Acosta. He has a history of CAD s/p LAD stent Oct 2009, attempt at VT ablation in Feb 2010, aborted due to inappropriate substrate. Aspirin had been discontinued due to rectal bleeding. Asymptomatic with last Myoview in 2013, which was normal. Plan for follow up in 1 year.   He had cardiology form sent to Dr. Gwenlyn Acosta. He's been checking his BP at home, systolic ranging 83-662. He's been drinking plenty of water. He denies any dizzy spells. He denies being on aspirin or any Acosta thinners. He denies history of sleep apnea.   BP Readings from Last 3 Encounters:  01/12/18 138/72  11/11/17 138/66  10/31/17 135/69    Patient Active Problem List   Diagnosis Date Noted  . Rib pain on left side 07/01/2016  . Fall 12/14/2015  . Elevated serum creatinine 12/14/2015  . Ileus ([place]) 12/14/2015  . Multiple fractures of ribs of left side 12/12/2015  . Prostate cancer ([place]) 05/26/2014  . Acosta in the urine 12/01/2013  . Cystitis, radiation 12/01/2013  . Hematuria 12/01/2013  . 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 Malignant  neoplasm of prostate ([place]) 11/26/2013  . Male erectile dysfunction 11/26/2013  . Pulsatile tinnitus 11/19/2013  . Rectal bleeding 12/23/2012  . Type II or unspecified type diabetes mellitus without mention of complication, not stated as uncontrolled   . Essential hypertension, benign   . Chronic back pain   . ED (erectile dysfunction)   . Anemia   . Hyperlipidemia   . CAD, NATIVE VESSEL 05/08/2008  . VENTRICULAR TACHYCARDIA 05/08/2008  . PREMATURE VENTRICULAR CONTRACTIONS 05/08/2008   Past Medical History:  Diagnosis Date  . Allergy   . CAD (coronary  artery disease)    pci to LAD 10/09; stable CAD by cath 05/31/08; myoview 04/11/11- no ishcemia, EF 67%; echo 05/15/09- EF>55%, mod calcification of the aortic valve leaflets  . Chronic back pain   . ED (erectile dysfunction)   . Essential hypertension, benign   . Hyperlipidemia   . Multiple rib fractures    left 9th, 10th, and 11th posterior rib fractures S/P fall 12/09/2015/notes 12/12/2015  . Prostate cancer ([place])    s/p  . Type II or unspecified type diabetes mellitus without mention of complication, not stated as uncontrolled   . Ventricular tachycardia ([place])    resuscitated; monitor 05/2008; attempted T ablation 2/10- aborted due to inappropriate substrate   Past Surgical History:  Procedure Laterality Date  . CARDIAC CATHETERIZATION  05/31/08   EF 55-60%, stable lesions, medical therapy  . COLECTOMY  '80's   polyps  . CORONARY ANGIOPLASTY WITH STENT PLACEMENT  01/19/08   PCI stent to mid LAD with Promus DES 27.5x28  . IR GENERIC HISTORICAL  01/03/2016   IR THORACENTESIS ASP PLEURAL SPACE W/IMG GUIDE 01/03/2016 [facility] RAD  . PTCA  01/2008   Allergies  Allergen Reactions  . Contrast Media [Iodinated Diagnostic Agents] Swelling    Possible neck swelling after contrast for chest CT 03/2016. No hives, throat or other symptoms   Prior to Admission medications   Medication Sig Start Date End Date Taking? Authorizing Provider  amLODipine (NORVASC) 5 MG tablet Take 1 tablet (5 mg total) by mouth daily. 11/11/17   Robert Agreste, MD  atorvastatin (LIPITOR) 10 MG tablet Take 1 tablet (10 mg total) by mouth daily. 11/11/17   Robert Agreste, MD  azelastine (ASTELIN) 0.1 % nasal spray Place 2 sprays into both nostrils 2 (two) times daily. Use in each nostril as directed 10/27/17   Robert Delaine D, PA-C  Acosta Glucose Monitoring Suppl (ACCU-CHEK AVIVA PLUS) w/Device KIT Test Acosta sugar 3 times daily. Dx code: E11.22 04/03/16   Robert Agreste, MD  busPIRone (BUSPAR) 5 MG tablet Take 0.5-2  tablets (2.5-10 mg total) by mouth 3 (three) times daily. 07/24/16   Robert Coop, PA-C  ciprofloxacin (CILOXAN) 0.3 % ophthalmic solution Place 1 drop into both eyes every 4 (four) hours while awake. 10/31/17   Robert Agreste, MD  diclofenac (FLECTOR) 1.3 % PTCH Place 1 patch onto the skin 2 (two) times daily. 01/29/17   Leandrew Koyanagi, MD  ferrous sulfate 325 (65 FE) MG tablet Take 325 mg by mouth daily with breakfast.    [provider]  GAVILYTE-G 236 g solution  05/24/16   [provider]  glucose Acosta (ACCU-CHEK AVIVA PLUS) test strip Test Acosta sugar 3 times daily. Dx code: E11.22 04/03/16   Robert Agreste, MD  Guaifenesin [facility] MAXIMUM STRENGTH) 1200 MG TB12 Take 1 tablet (1,200 mg total) by mouth every 12 (twelve) hours as needed. 10/27/17  Timmothy Euler, [place] D, PA-C  insulin NPH-regular Human (NOVOLIN 70/30) (70-30) 100 UNIT/ML injection ADMINISTER 8 UNITS UNDER THE SKIN TWICE DAILY WITH A MEAL 11/11/17   Robert Agreste, MD  Insulin Syringe-Needle U-100 (B-Acosta INS SYR HALF-UNIT .3CC/31G) 31G X 5/16" 0.3 ML MISC Use daily at bedtime to inject insulin. Dx code: 250.00 06/21/13   Robert Agreste, MD  Lancets (ACCU-CHEK MULTICLIX) lancets Test Acosta sugar 3 times daily. Dx code: E11.22 04/03/16   Robert Agreste, MD  lisinopril-hydrochlorothiazide (PRINZIDE,ZESTORETIC) 20-25 MG tablet Take 1 tablet by mouth daily. 11/11/17   Robert Agreste, MD  metFORMIN (GLUCOPHAGE) 1000 MG tablet Take 1 tablet (1,000 mg total) by mouth 2 (two) times daily with a meal. 11/11/17   Robert Agreste, MD  metoprolol tartrate (LOPRESSOR) 50 MG tablet Take 1 tablet (50 mg total) by mouth 2 (two) times daily. 11/11/17   Robert Agreste, MD  Multiple Vitamin (MULTIVITAMIN) tablet Take 1 tablet by mouth daily. COMPLETE SENIOR VITAMINS AND M    [provider]  Omega-3 Fatty Acids (FISH OIL) 1000 MG CAPS Take 2 capsules by mouth daily.     [provider]  Vitamin Acosta,  Cholecalciferol, 1000 UNITS TABS Take 1 tablet by mouth daily.     [provider]  VOLTAREN 1 % GEL  06/08/16   [provider]   Social History   Socioeconomic History  . Marital status: Legally Separated    Spouse name: Not on file  . Number of children: 7  . Years of education: Not on file  . Highest education level: Not on file  Occupational History  . Occupation: RETIRED    Employer: RETIRED  Social Needs  . Financial resource strain: Not on file  . Food insecurity:    Worry: Not on file    Inability: Not on file  . Transportation needs:    Medical: Not on file    Non-medical: Not on file  Tobacco Use  . Smoking status: Former Smoker    Packs/day: 0.00    Years: 15.00    Pack years: 0.00    Types: Cigarettes    Last attempt to quit: 05/10/2017    Years since quitting: 0.6  . Smokeless tobacco: Never Used  . Tobacco comment: Patient smokes occasionally. Not everyday.  Substance and Sexual Activity  . Alcohol use: No  . Drug use: No  . Sexual activity: Never  Lifestyle  . Physical activity:    Days per week: Not on file    Minutes per session: Not on file  . Stress: Not on file  Relationships  . Social connections:    Talks on phone: Not on file    Gets together: Not on file    Attends religious service: Not on file    Active member of club or organization: Not on file    Attends meetings of clubs or organizations: Not on file    Relationship status: Not on file  . Intimate partner violence:    Fear of current or ex partner: Not on file    Emotionally abused: Not on file    Physically abused: Not on file    Forced sexual activity: Not on file  Other Topics Concern  . Not on file  Social History Narrative   29 grandchildren. Single. Education: 12 th grade. Exercise: 3-4 times a week for 30 minutes working out and set ups.   Review of Systems  Constitutional: Negative for fatigue and  unexpected weight change.  Eyes: Negative for visual  disturbance.  Respiratory: Negative for cough, chest tightness and shortness of breath.   Cardiovascular: Negative for chest pain, palpitations and leg swelling.  Gastrointestinal: Negative for abdominal pain and Acosta in stool.  Neurological: Negative for dizziness, light-headedness and headaches.       Objective:   Physical Exam  Constitutional: He is oriented to person, place, and time. He appears well-developed and well-nourished.  HENT:  Head: Normocephalic and atraumatic.  Left parietal scalp: 6cm x 5 cm of slight hyperpigmentation with elevated lesion anterior 1/3 of that area, measures 3 cm by 2.5 cm. No surrounding erythema; thinning and missing areas of hair  Eyes: Pupils are equal, round, and reactive to light. EOM are normal.  Neck: No JVD present. Carotid bruit is not present.  Cardiovascular: Normal rate and normal heart sounds. An irregular rhythm present.  No murmur heard. No 3rd heart sound  Pulmonary/Chest: Effort normal and breath sounds normal. He has no rales.  Musculoskeletal: He exhibits no edema.  Neurological: He is alert and oriented to person, place, and time.  Skin: Skin is warm and dry.  Psychiatric: He has a normal mood and affect.  Vitals reviewed.   Vitals:   01/12/18 1522  BP: 138/72  Pulse: 68  Resp: 16  Temp: 98 F (36.7 C)  TempSrc: Oral  SpO2: 99%  Weight: 228 lb (103.4 kg)  Height: 6' (1.829 m)   Dg Chest 2 View  Result Date: 01/12/2018 CLINICAL DATA:  Preoperative clearance EXAM: CHEST - 2 VIEW COMPARISON:  10/27/2017 FINDINGS: Cardiac shadow is mildly prominent but stable. Lungs are well aerated bilaterally. Minimal scarring is noted in the left lung base stable from the previous exam. No focal infiltrate or sizable effusion is noted. Aortic calcifications are seen. Degenerative changes of the thoracic spine are noted. IMPRESSION: No acute abnormality noted.  Stable scarring in the left base. Electronically Signed   By: Inez Catalina  M.Acosta.   On: 01/12/2018 16:37        Assessment & Plan:   [name] is a 84 y.o. male Preoperative clearance - Plan: CBC, DG Chest 2 View Type 2 diabetes mellitus with chronic kidney disease, with long-term current use of insulin, unspecified CKD stage ([place]) - Plan: Basic metabolic panel History of anemia - Plan: CBC  -Preoperative clearance, will obtain labs as above.  Anticipate medical clearance once those have been performed, but will need cardiac clearance with his cardiologist.  Discussed avoidance of insulin day of surgery if he is fasting, then restart once he starts eating meals.  Maintain hydration perioperatively to minimize renal injury.  Forms to be completed once labs obtained.  No apparent acute findings on chest x-Acosta.  Need for prophylactic vaccination and inoculation against influenza - Plan: Flu vaccine HIGH DOSE PF (Fluzone High dose), CANCELED: Flu Vaccine QUAD 36+ mos IM  Scalp lesion - Plan: DISCONTINUED: clotrimazole-betamethasone (LOTRISONE) cream  -Based on timing, possible tinea capitis.  Start Lotrisone cream, recheck in 1 week to determine if biopsy versus dermatology eval needed.   Meds ordered this encounter  Medications  . DISCONTD: clotrimazole-betamethasone (LOTRISONE) cream    Sig: Apply 1 application topically 2 (two) times daily.    Dispense:  30 g    Refill:  0   Patient Instructions   I will check Acosta sugar with electrolytes and kidney test today. No change in meds for now, but you will not need to take insulin if  you are not eating (do not take on morning of surgery, then restart once you restart food intake). Monitor Acosta sugar 3 times per day initiially after surgery. If any readings over 300 or low readings under 80 - I need to know.  Stay hydrated around time of surgery to help protect from worsening kidney problems.   Once I review your Acosta work and x-Acosta results I can work on your paperwork.  Cardiac clearance will need to be  performed by Dr. Gwenlyn Acosta.   Rash in the scalp may be due to tinea capitis or a type of ringworm, but I would like to see it again in 1 week as I may need to biopsy that area or refer you to dermatology.  Start the antifungal cream twice per day.  There is also an anti-itch cream in that prescription.  Recheck in 1 week, sooner if any discharge, redness, or worsening symptoms.    Return to the clinic or go to the nearest emergency room if any of your symptoms worsen or new symptoms occur.    If you have lab work done today you will be contacted with your lab results within the next 2 weeks.  If you have not heard from [place] then please contact us. The fastest way to get your results is to register for My Chart.   IF you received an x-Acosta today, you will receive an invoice from [facility]. Please contact [facility] at [phone] with questions or concerns regarding your invoice.   IF you received labwork today, you will receive an invoice from [facility]. Please contact [facility] at [phone] with questions or concerns regarding your invoice.   Our billing staff will not be able to assist you with questions regarding bills from these companies.  You will be contacted with the lab results as soon as they are available. The fastest way to get your results is to activate your My Chart account. Instructions are located on the last page of this paperwork. If you have not heard from [place] regarding the results in 2 weeks, please contact this office.       I personally performed the services described in this documentation, which was scribed in my presence. The recorded information has been reviewed and considered for accuracy and completeness, addended by me as needed, and agree with information above.  Signed,   Robert Ray, MD [facility].  01/16/18 4:50 PM

## 2018-01-13 LAB — CBC
Hematocrit: 37.8 % (ref 37.5–51.0)
Hemoglobin: 12.1 g/dL — ABNORMAL LOW (ref 13.0–17.7)
MCH: 26 pg — ABNORMAL LOW (ref 26.6–33.0)
MCHC: 32 g/dL (ref 31.5–35.7)
MCV: 81 fL (ref 79–97)
Platelets: 242 10*3/uL (ref 150–450)
RBC: 4.65 x10E6/uL (ref 4.14–5.80)
RDW: 15.7 % — ABNORMAL HIGH (ref 12.3–15.4)
WBC: 6.5 10*3/uL (ref 3.4–10.8)

## 2018-01-13 LAB — BASIC METABOLIC PANEL
BUN/Creatinine Ratio: 16 (ref 10–24)
BUN: 27 mg/dL (ref 8–27)
CO2: 18 mmol/L — ABNORMAL LOW (ref 20–29)
Calcium: 9.6 mg/dL (ref 8.6–10.2)
Chloride: 105 mmol/L (ref 96–106)
Creatinine, Ser: 1.74 mg/dL — ABNORMAL HIGH (ref 0.76–1.27)
GFR calc Af Amer: 42 mL/min/{1.73_m2} — ABNORMAL LOW (ref >59)
GFR calc non Af Amer: 36 mL/min/{1.73_m2} — ABNORMAL LOW (ref >59)
Glucose: 99 mg/dL (ref 65–99)
Potassium: 4.8 mmol/L (ref 3.5–5.2)
Sodium: 140 mmol/L (ref 134–144)

## 2018-01-14 ENCOUNTER — Other Ambulatory Visit: Payer: Self-pay | Admitting: Family Medicine

## 2018-01-14 DIAGNOSIS — C61 Malignant neoplasm of prostate: Secondary | ICD-10-CM | POA: Diagnosis not present

## 2018-01-14 DIAGNOSIS — I10 Essential (primary) hypertension: Secondary | ICD-10-CM

## 2018-01-14 NOTE — Telephone Encounter (Signed)
Requested medication (s) are due for refill today: Yes  Requested medication (s) are on the active medication list: Yes  Last refill:  11/11/17 sent to local pharmacy  Future visit scheduled: Yes  Notes to clinic: Patient is switching from local to mail order pharmacy [facility] unable to refill per protocol due to abnormal creatinine.     Requested Prescriptions  Pending Prescriptions Disp Refills   lisinopril-hydrochlorothiazide (PRINZIDE,ZESTORETIC) 20-25 MG tablet [Pharmacy Med Name: LISINOPRIL/HYDROCHLOROTHIAZIDE 20-25 MG Tablet] 90 tablet 1    Sig: TAKE 1 TABLET EVERY DAY     Cardiovascular:  ACEI + Diuretic Combos Failed - 01/14/2018  6:04 PM      Failed - Cr in normal range and within 180 days    Creat  Date Value Ref Range Status  01/30/2016 1.27 (H) 0.70 - 1.18 mg/dL Final    Comment:      For patients > or = 82 years of age: The upper reference limit for Creatinine is approximately 13% higher for people identified as African-American.      Creatinine, Ser  Date Value Ref Range Status  01/12/2018 1.74 (H) 0.76 - 1.27 mg/dL Final         Passed - Na in normal range and within 180 days    Sodium  Date Value Ref Range Status  01/12/2018 140 134 - 144 mmol/L Final         Passed - K in normal range and within 180 days    Potassium  Date Value Ref Range Status  01/12/2018 4.8 3.5 - 5.2 mmol/L Final         Passed - Ca in normal range and within 180 days    Calcium  Date Value Ref Range Status  01/12/2018 9.6 8.6 - 10.2 mg/dL Final         Passed - Patient is not pregnant      Passed - Last BP in normal range    BP Readings from Last 1 Encounters:  01/12/18 138/72         Passed - Valid encounter within last 6 months    Recent Outpatient Visits          2 days ago Preoperative clearance   [facility] [place], MD   2 months ago Chronic kidney disease, unspecified CKD stage   [facility], Ranell Patrick, MD   2  months ago LRTI (lower respiratory tract infection)   [facility], Ranell Patrick, MD   2 months ago Acute upper respiratory infection   [facility], [place] D, PA-C   5 months ago Type 2 diabetes mellitus with microalbuminuria, with long-term current use of insulin [facility])   [facility], Ranell Patrick, MD      Future Appointments            In 1 month Carlota Raspberry Ranell Patrick, MD [facility] [place], [facility]

## 2018-01-17 ENCOUNTER — Other Ambulatory Visit: Payer: Self-pay | Admitting: Family Medicine

## 2018-01-17 DIAGNOSIS — E785 Hyperlipidemia, unspecified: Secondary | ICD-10-CM

## 2018-01-21 DIAGNOSIS — C61 Malignant neoplasm of prostate: Secondary | ICD-10-CM | POA: Diagnosis not present

## 2018-01-21 DIAGNOSIS — C679 Malignant neoplasm of bladder, unspecified: Secondary | ICD-10-CM | POA: Diagnosis not present

## 2018-01-29 ENCOUNTER — Telehealth: Payer: Self-pay

## 2018-01-29 NOTE — Telephone Encounter (Signed)
   [name] Pre-operative Risk Assessment    Request for surgical clearance:  1. What type of surgery is being performed? Right total knee   2. When is this surgery scheduled? TBD   3. What type of clearance is required (medical clearance vs. Pharmacy clearance to hold med vs. Both)? medical  4. Are there any medications that need to be held prior to surgery and how long? Not specified, no thinners on file as of yet.  5. Practice name and name of physician performing surgery? [facility] & Dr. Delfino Lovett  6. What is your office phone number [phone]    7.   What is your office fax number [phone]  8.   Anesthesia type (None, local, MAC, general) ? spinal   [name] 01/29/2018, 11:11 AM  _________________________________________________________________   (provider comments below)

## 2018-01-29 NOTE — Telephone Encounter (Signed)
Pt walked in with a Surgical Clearance form and it was entered into the surgical clearance pool but in looking through the pts records he is due for his 1 year OV.. Pt agreed to make an appt.Marland Kitchen He is hoping for the clearance to be done soon since he is in a lot of pain.Marland Kitchenappt made with Dr. [name] 02/03/18. Form put into Dr. Kennon Holter nurse box.

## 2018-02-03 ENCOUNTER — Encounter: Payer: Self-pay | Admitting: Cardiovascular Disease

## 2018-02-03 ENCOUNTER — Ambulatory Visit: Payer: Medicare HMO | Admitting: Cardiovascular Disease

## 2018-02-03 DIAGNOSIS — I2510 Atherosclerotic heart disease of native coronary artery without angina pectoris: Secondary | ICD-10-CM

## 2018-02-03 DIAGNOSIS — I10 Essential (primary) hypertension: Secondary | ICD-10-CM

## 2018-02-03 DIAGNOSIS — E7800 Pure hypercholesterolemia, unspecified: Secondary | ICD-10-CM

## 2018-02-03 NOTE — Patient Instructions (Signed)
Medication Instructions:  Your physician recommends that you continue on your current medications as directed. Please refer to the Current Medication list given to you today.  If you need a refill on your cardiac medications before your next appointment, please call your pharmacy.   Lab work: none If you have labs (blood work) drawn today and your tests are completely normal, you will receive your results only by: Marland Kitchen MyChart Message (if you have MyChart) OR . A paper copy in the mail If you have any lab test that is abnormal or we need to change your treatment, we will call you to review the results.  Testing/Procedures: none  Follow-Up: At [facility], you and your health needs are our priority.  As part of our continuing mission to provide you with exceptional heart care, we have created designated Provider Care Teams.  These Care Teams include your primary Cardiologist (physician) and Advanced Practice Providers (APPs -  Physician Assistants and Nurse Practitioners) who all work together to provide you with the care you need, when you need it. You will need a follow up appointment in 12 months.  Please call our office 2 months in advance to schedule this appointment.  You may see Quay Burow, MD or one of the following Advanced Practice Providers on your designated Care Team:   Kerin Ransom, PA-C Roby Lofts, [place] . Sande Rives, PA-C  Any Other Special Instructions Will Be Listed Below (If Applicable).

## 2018-02-03 NOTE — Assessment & Plan Note (Signed)
History of CAD status post LAD stenting with a Promus drug-eluting stent October 2009 without recurrence.  Last Myoview performed 04/11/2011 was completely normal.  He is asymptomatic.  He wishes to proceed with bilateral total knee replacement which will clear him for low risk given his lack of symptoms and the low risk nature of the procedure.

## 2018-02-03 NOTE — Assessment & Plan Note (Signed)
History of hyperlipidemia on statin therapy with lipid profile performed 08/07/2017 revealing total cholesterol 145, LDL 73 and HDL 43

## 2018-02-03 NOTE — Progress Notes (Signed)
02/03/2018 NAZAIRE CORDIAL   [birth year]  [id number]  Primary Physician [name], MD Primary Cardiologist: Lorretta Harp MD FACP, [place], [place], [place]  HPI:  [name] is a 84 y.o.  moderately overweight, recently remarried for the third time (09/30/14) [place] American male, father of 3 children and 43 stepchildren, grandfather to 47 grandchildren, who is formerly a patient of Dr. Francine Graven. I last saw him 01/08/2017. He has a history of CAD status post LAD stenting with a Promus drug-eluting stent, October of 2009. He had attempt at VT ablation by Dr. Thompson Grayer, February 2010; however, this was aborted because of inappropriate substrate. His other problems include hypertension, hyperlipidemia, diabetes, as well as history of prostate cancer. Since I saw him, he has been asymptomatic. His last Myoview performed 04/11/11 was completely normal. He did stop his aspirin because of rectal bleeding is noticed increased evening flushing with Niaspan. His most recent lipid profile performed 08/07/2017 revealed total cholesterol 145, LDL 73 and HDL 43 he did fall down and crack 3 of his ribs several weeks prior to his last office visit.  Since I saw him a year ago he is doing well and is asymptomatic.  He specifically denies chest pain or shortness of breath.  Apparently he needs bilateral total knee replacement in the near future.    Current Meds  Medication Sig  . amLODipine (NORVASC) 5 MG tablet Take 1 tablet (5 mg total) by mouth daily.  Marland Kitchen atorvastatin (LIPITOR) 10 MG tablet TAKE 1 TABLET EVERY DAY  . azelastine (ASTELIN) 0.1 % nasal spray Place 2 sprays into both nostrils 2 (two) times daily. Use in each nostril as directed  . Blood Glucose Monitoring Suppl (ACCU-CHEK AVIVA PLUS) w/Device KIT Test blood sugar 3 times daily. Dx code: E11.22  . busPIRone (BUSPAR) 5 MG tablet Take 0.5-2 tablets (2.5-10 mg total) by mouth 3 (three) times daily.  . ciprofloxacin (CILOXAN) 0.3 %  ophthalmic solution Place 1 drop into both eyes every 4 (four) hours while awake.  . clotrimazole-betamethasone (LOTRISONE) cream Apply 1 application topically 2 (two) times daily.  . ferrous sulfate 325 (65 FE) MG tablet Take 325 mg by mouth daily with breakfast.  . GAVILYTE-G 236 g solution   . glucose blood (ACCU-CHEK AVIVA PLUS) test strip Test blood sugar 3 times daily. Dx code: E11.22  . Guaifenesin (MUCINEX MAXIMUM STRENGTH) 1200 MG TB12 Take 1 tablet (1,200 mg total) by mouth every 12 (twelve) hours as needed.  . insulin NPH-regular Human (NOVOLIN 70/30) (70-30) 100 UNIT/ML injection ADMINISTER 8 UNITS UNDER THE SKIN TWICE DAILY WITH A MEAL  . Insulin Syringe-Needle U-100 (B-D INS SYR HALF-UNIT .3CC/31G) 31G X 5/16" 0.3 ML MISC Use daily at bedtime to inject insulin. Dx code: 250.00  . Lancets (ACCU-CHEK MULTICLIX) lancets Test blood sugar 3 times daily. Dx code: E11.22  . lisinopril-hydrochlorothiazide (PRINZIDE,ZESTORETIC) 20-25 MG tablet Take 1 tablet by mouth daily.  . metFORMIN (GLUCOPHAGE) 1000 MG tablet Take 1 tablet (1,000 mg total) by mouth 2 (two) times daily with a meal.  . metoprolol tartrate (LOPRESSOR) 50 MG tablet Take 1 tablet (50 mg total) by mouth 2 (two) times daily.  . Multiple Vitamin (MULTIVITAMIN) tablet Take 1 tablet by mouth daily. COMPLETE SENIOR VITAMINS AND M  . Omega-3 Fatty Acids (FISH OIL) 1000 MG CAPS Take 2 capsules by mouth daily.   . Vitamin D, Cholecalciferol, 1000 UNITS TABS Take 1 tablet by mouth daily.  Allergies  Allergen Reactions  . Contrast Media [Iodinated Diagnostic Agents] Swelling    Possible neck swelling after contrast for chest CT 03/2016. No hives, throat or other symptoms    Social History   Socioeconomic History  . Marital status: Legally Separated    Spouse name: Not on file  . Number of children: 7  . Years of education: Not on file  . Highest education level: Not on file  Occupational History  . Occupation: RETIRED     Employer: RETIRED  Social Needs  . Financial resource strain: Not on file  . Food insecurity:    Worry: Not on file    Inability: Not on file  . Transportation needs:    Medical: Not on file    Non-medical: Not on file  Tobacco Use  . Smoking status: Former Smoker    Packs/day: 0.00    Years: 15.00    Pack years: 0.00    Types: Cigarettes    Last attempt to quit: 05/10/2017    Years since quitting: 0.7  . Smokeless tobacco: Never Used  . Tobacco comment: Patient smokes occasionally. Not everyday.  Substance and Sexual Activity  . Alcohol use: No  . Drug use: No  . Sexual activity: Never  Lifestyle  . Physical activity:    Days per week: Not on file    Minutes per session: Not on file  . Stress: Not on file  Relationships  . Social connections:    Talks on phone: Not on file    Gets together: Not on file    Attends religious service: Not on file    Active member of club or organization: Not on file    Attends meetings of clubs or organizations: Not on file    Relationship status: Not on file  . Intimate partner violence:    Fear of current or ex partner: Not on file    Emotionally abused: Not on file    Physically abused: Not on file    Forced sexual activity: Not on file  Other Topics Concern  . Not on file  Social History Narrative   29 grandchildren. Single. Education: 12 th grade. Exercise: 3-4 times a week for 30 minutes working out and set ups.     Review of Systems: General: negative for chills, fever, night sweats or weight changes.  Cardiovascular: negative for chest pain, dyspnea on exertion, edema, orthopnea, palpitations, paroxysmal nocturnal dyspnea or shortness of breath Dermatological: negative for rash Respiratory: negative for cough or wheezing Urologic: negative for hematuria Abdominal: negative for nausea, vomiting, diarrhea, bright red blood per rectum, melena, or hematemesis Neurologic: negative for visual changes, syncope, or dizziness All  other systems reviewed and are otherwise negative except as noted above.    Blood pressure 136/78, pulse 66, height 6' (1.829 m), weight 231 lb (104.8 kg).  General appearance: alert and no distress Neck: no adenopathy, no carotid bruit, no JVD, supple, symmetrical, trachea midline and thyroid not enlarged, symmetric, no tenderness/mass/nodules Lungs: clear to auscultation bilaterally Heart: regular rate and rhythm, S1, S2 normal, no murmur, click, rub or gallop Extremities: extremities normal, atraumatic, no cyanosis or edema Pulses: 2+ and symmetric Skin: Skin color, texture, turgor normal. No rashes or lesions Neurologic: Alert and oriented X 3, normal strength and tone. Normal symmetric reflexes. Normal coordination and gait  EKG sinus rhythm at 66 without ST or T wave changes.  Personally reviewed this EKG.  ASSESSMENT AND PLAN:   CAD, NATIVE VESSEL History of  CAD status post LAD stenting with a Promus drug-eluting stent October 2009 without recurrence.  Last Myoview performed 04/11/2011 was completely normal.  He is asymptomatic.  He wishes to proceed with bilateral total knee replacement which will clear him for low risk given his lack of symptoms and the low risk nature of the procedure.  Essential hypertension, benign History of essential hypertension blood pressure measured at 136/78.  He is on amlodipine, lisinopril, hydrochlorothiazide and metoprolol.  Hyperlipidemia History of hyperlipidemia on statin therapy with lipid profile performed 08/07/2017 revealing total cholesterol 145, LDL 73 and HDL [place] MD [facility], [facility] 02/03/2018 10:00 AM

## 2018-02-03 NOTE — Assessment & Plan Note (Signed)
History of essential hypertension blood pressure measured at 136/78.  He is on amlodipine, lisinopril, hydrochlorothiazide and metoprolol.

## 2018-02-04 ENCOUNTER — Encounter: Payer: Self-pay | Admitting: Cardiovascular Disease

## 2018-02-12 ENCOUNTER — Ambulatory Visit: Payer: Medicare HMO | Admitting: Family Medicine

## 2018-02-13 ENCOUNTER — Encounter: Payer: Self-pay | Admitting: Family Medicine

## 2018-02-13 ENCOUNTER — Ambulatory Visit (INDEPENDENT_AMBULATORY_CARE_PROVIDER_SITE_OTHER): Payer: Medicare HMO | Admitting: Family Medicine

## 2018-02-13 VITALS — BP 160/70 | HR 78 | Temp 98.7°F | Ht 72.0 in | Wt 230.0 lb

## 2018-02-13 DIAGNOSIS — Z794 Long term (current) use of insulin: Secondary | ICD-10-CM

## 2018-02-13 DIAGNOSIS — I10 Essential (primary) hypertension: Secondary | ICD-10-CM

## 2018-02-13 DIAGNOSIS — R059 Cough, unspecified: Secondary | ICD-10-CM

## 2018-02-13 DIAGNOSIS — R809 Proteinuria, unspecified: Secondary | ICD-10-CM | POA: Diagnosis not present

## 2018-02-13 DIAGNOSIS — R05 Cough: Secondary | ICD-10-CM

## 2018-02-13 DIAGNOSIS — E785 Hyperlipidemia, unspecified: Secondary | ICD-10-CM | POA: Diagnosis not present

## 2018-02-13 DIAGNOSIS — J309 Allergic rhinitis, unspecified: Secondary | ICD-10-CM | POA: Diagnosis not present

## 2018-02-13 DIAGNOSIS — E1129 Type 2 diabetes mellitus with other diabetic kidney complication: Secondary | ICD-10-CM | POA: Diagnosis not present

## 2018-02-13 LAB — COMPREHENSIVE METABOLIC PANEL
ALT: 11 IU/L (ref 0–44)
AST: 17 IU/L (ref 0–40)
Albumin/Globulin Ratio: 1.5 (ref 1.2–2.2)
Albumin: 3.8 g/dL (ref 3.5–4.7)
Alkaline Phosphatase: 44 IU/L (ref 39–117)
BUN/Creatinine Ratio: 9 — ABNORMAL LOW (ref 10–24)
BUN: 15 mg/dL (ref 8–27)
Bilirubin Total: 0.4 mg/dL (ref 0.0–1.2)
CO2: 21 mmol/L (ref 20–29)
Calcium: 9.2 mg/dL (ref 8.6–10.2)
Chloride: 106 mmol/L (ref 96–106)
Creatinine, Ser: 1.66 mg/dL — ABNORMAL HIGH (ref 0.76–1.27)
GFR calc Af Amer: 44 mL/min/{1.73_m2} — ABNORMAL LOW (ref >59)
GFR calc non Af Amer: 38 mL/min/{1.73_m2} — ABNORMAL LOW (ref >59)
Globulin, Total: 2.6 g/dL (ref 1.5–4.5)
Glucose: 108 mg/dL — ABNORMAL HIGH (ref 65–99)
Potassium: 4.3 mmol/L (ref 3.5–5.2)
Sodium: 140 mmol/L (ref 134–144)
Total Protein: 6.4 g/dL (ref 6.0–8.5)

## 2018-02-13 LAB — POCT GLYCOSYLATED HEMOGLOBIN (HGB A1C): Hemoglobin A1C: 7.5 % — AB (ref 4.0–5.6)

## 2018-02-13 LAB — LIPID PANEL
Chol/HDL Ratio: 3.5 ratio (ref 0.0–5.0)
Cholesterol, Total: 137 mg/dL (ref 100–199)
HDL: 39 mg/dL — ABNORMAL LOW (ref >39)
LDL Calculated: 62 mg/dL (ref 0–99)
Triglycerides: 181 mg/dL — ABNORMAL HIGH (ref 0–149)
VLDL Cholesterol Cal: 36 mg/dL (ref 5–40)

## 2018-02-13 MED ORDER — ATORVASTATIN CALCIUM 10 MG PO TABS: 10.0000 mg | ORAL_TABLET | Freq: Every day | ORAL | 1 refills | Status: DC

## 2018-02-13 MED ORDER — LISINOPRIL-HYDROCHLOROTHIAZIDE 20-25 MG PO TABS: 1.0000 | ORAL_TABLET | Freq: Every day | ORAL | 1 refills | Status: DC

## 2018-02-13 MED ORDER — METOPROLOL TARTRATE 50 MG PO TABS: 50.0000 mg | ORAL_TABLET | Freq: Two times a day (BID) | ORAL | 1 refills | Status: DC

## 2018-02-13 MED ORDER — AMLODIPINE BESYLATE 5 MG PO TABS: 7.5000 mg | ORAL_TABLET | Freq: Every day | ORAL | 1 refills | Status: DC

## 2018-02-13 MED ORDER — METFORMIN HCL 1000 MG PO TABS: 1000.0000 mg | ORAL_TABLET | Freq: Two times a day (BID) | ORAL | 1 refills | Status: DC

## 2018-02-13 MED ORDER — INSULIN NPH ISOPHANE & REGULAR (70-30) 100 UNIT/ML ~~LOC~~ SUSP: SUBCUTANEOUS | 2 refills | Status: DC

## 2018-02-13 NOTE — Progress Notes (Signed)
Subjective:  By signing my name below, I, [name], attest that this documentation has been prepared under the direction and in the presence of [name], MD Electronically Signed: Ladene Acosta, ED Scribe 02/13/2018 at 8:20 AM.   Patient ID: [name], male    DOB: 10-11-1933, 84 y.o.   MRN: [id number]  Chief Complaint  Patient presents with  . Diabetes    f/u    HPI [name] is a 84 y.o. male who presents to Primary Care at [facility] for f/u.  DM Lab Results  Component Value Date   HGBA1C 8.1 (H) 11/11/2017  Recommended diet changes. Continued 70/30 insulin. Taking 8 units bid. On statin and ACE. Metformin 1000 mg bid. Optho 07/17/17. Foot 08/07/17. Pneumovax 2015. Urine micro albumin in May, elevated. - Pt states that he wasn't able to have a surgery since his A1C was elevated. He hasn't checked his blood glucose outside of the office within the past wk. Denies hypoglycemic symptoms or any changes to diet/exercise since last OV; reports bilat knee pain which is limiting him.  H/o CAD Seen 10/29 by cardiologist Dr. [name]. Stent to LAD in 2009, h/o V-tach with attempted ablation in 2010 but insufficient substrate. Not able to take asa due to prior rectal bleeding. No changes to regimen.  Hyperlipidemia Lab Results  Component Value Date   CHOL 145 08/07/2017   HDL 43 08/07/2017   LDLCALC 73 08/07/2017   TRIG 144 08/07/2017   CHOLHDL 3.4 08/07/2017   Lab Results  Component Value Date   ALT 8 08/07/2017   AST 11 08/07/2017   ALKPHOS 41 08/07/2017   BILITOT 0.4 08/07/2017  Lipitor 10 mg qd. - Denies new side-effects.  HTN Lab Results  Component Value Date   CREATININE 1.74 (H) 01/12/2018   BP Readings from Last 3 Encounters:  02/13/18 (!) 164/82  02/03/18 136/78  01/12/18 138/72  Controlled on Norvasc, Lisinopril-HCTZ and metoprolol. When seen on 10/29 by Dr. [name], 136/78. - Pt Denies missed doses, leg swelling.   H/o Prostate CA Followed by  Dr. [name] with [facility]. H/o radiation and Lupron. - Started on Myrbetriq which he suspects is elevating his BP.  Cough Pt reports occasionally productive cough with white discolored phlegm since he quit smoking 6-7 months ago. He reports occasional rhinorrhea as well. Denies hemoptysis, nasal congestion, sore throat, fever, chills, sob. CXR on 10/7 showed no acute abnormality with stable scarring in L base.  Patient Active Problem List   Diagnosis Date Noted  . Rib pain on left side 07/01/2016  . Fall 12/14/2015  . Elevated serum creatinine 12/14/2015  . Ileus ([place]) 12/14/2015  . Multiple fractures of ribs of left side 12/12/2015  . Prostate cancer ([place]) 05/26/2014  . Blood in the urine 12/01/2013  . Cystitis, radiation 12/01/2013  . Hematuria 12/01/2013  . 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 Malignant neoplasm of prostate ([place]) 11/26/2013  . Male erectile dysfunction 11/26/2013  . Pulsatile tinnitus 11/19/2013  . Rectal bleeding 12/23/2012  . Type II or unspecified type diabetes mellitus without mention of complication, not stated as uncontrolled   . Essential hypertension, benign   . Chronic back pain   . ED (erectile dysfunction)   . Anemia   . Hyperlipidemia   . CAD, NATIVE VESSEL 05/08/2008  . VENTRICULAR TACHYCARDIA 05/08/2008  . PREMATURE VENTRICULAR CONTRACTIONS 05/08/2008   Past Medical History:  Diagnosis Date  . Allergy   . CAD (coronary artery disease)  pci to LAD 10/09; stable CAD by cath 05/31/08; myoview 04/11/11- no ishcemia, EF 67%; echo 05/15/09- EF>55%, mod calcification of the aortic valve leaflets  . Chronic back pain   . ED (erectile dysfunction)   . Essential hypertension, benign   . Hyperlipidemia   . Multiple rib fractures    left 9th, 10th, and 11th posterior rib fractures S/P fall 12/09/2015/notes 12/12/2015  . Prostate cancer ([place])    s/p  . Type II or unspecified type diabetes mellitus without mention of complication, not stated as uncontrolled    . Ventricular tachycardia ([place])    resuscitated; monitor 05/2008; attempted T ablation 2/10- aborted due to inappropriate substrate   Past Surgical History:  Procedure Laterality Date  . CARDIAC CATHETERIZATION  05/31/08   EF 55-60%, stable lesions, medical therapy  . COLECTOMY  '80's   polyps  . CORONARY ANGIOPLASTY WITH STENT PLACEMENT  01/19/08   PCI stent to mid LAD with Promus DES 27.5x28  . IR GENERIC HISTORICAL  01/03/2016   IR THORACENTESIS ASP PLEURAL SPACE W/IMG GUIDE 01/03/2016 [facility]-INTERV RAD  . PTCA  01/2008   Allergies  Allergen Reactions  . Contrast Media [Iodinated Diagnostic Agents] Swelling    Possible neck swelling after contrast for chest CT 03/2016. No hives, throat or other symptoms   Prior to Admission medications   Medication Sig Start Date End Date Taking? Authorizing Provider  amLODipine (NORVASC) 5 MG tablet Take 1 tablet (5 mg total) by mouth daily. 11/11/17   [name], MD  atorvastatin (LIPITOR) 10 MG tablet TAKE 1 TABLET EVERY DAY 01/19/18   [name], MD  azelastine (ASTELIN) 0.1 % nasal spray Place 2 sprays into both nostrils 2 (two) times daily. Use in each nostril as directed 10/27/17   Tenna Delaine D, PA-C  Blood Glucose Monitoring Suppl (ACCU-CHEK AVIVA PLUS) w/Device KIT Test blood sugar 3 times daily. Dx code: E11.22 04/03/16   [name], MD  busPIRone (BUSPAR) 5 MG tablet Take 0.5-2 tablets (2.5-10 mg total) by mouth 3 (three) times daily. 07/24/16   Tereasa Coop, PA-C  ciprofloxacin (CILOXAN) 0.3 % ophthalmic solution Place 1 drop into both eyes every 4 (four) hours while awake. 10/31/17   [name], MD  clotrimazole-betamethasone (LOTRISONE) cream Apply 1 application topically 2 (two) times daily. 01/12/18   [name], MD  ferrous sulfate 325 (65 FE) MG tablet Take 325 mg by mouth daily with breakfast.    [provider]  GAVILYTE-G 236 g solution  05/24/16   [provider]  glucose  blood (ACCU-CHEK AVIVA PLUS) test strip Test blood sugar 3 times daily. Dx code: E11.22 04/03/16   [name], MD  Guaifenesin [facility] MAXIMUM STRENGTH) 1200 MG TB12 Take 1 tablet (1,200 mg total) by mouth every 12 (twelve) hours as needed. 10/27/17   Tenna Delaine D, PA-C  insulin NPH-regular Human (NOVOLIN 70/30) (70-30) 100 UNIT/ML injection ADMINISTER 8 UNITS UNDER THE SKIN TWICE DAILY WITH A MEAL 11/11/17   [name], MD  Insulin Syringe-Needle U-100 (B-D INS SYR HALF-UNIT .3CC/31G) 31G X 5/16" 0.3 ML MISC Use daily at bedtime to inject insulin. Dx code: 250.00 06/21/13   [name], MD  Lancets (ACCU-CHEK MULTICLIX) lancets Test blood sugar 3 times daily. Dx code: E11.22 04/03/16   [name], MD  lisinopril-hydrochlorothiazide (PRINZIDE,ZESTORETIC) 20-25 MG tablet Take 1 tablet by mouth daily. 11/11/17   [name], MD  metFORMIN (GLUCOPHAGE) 1000 MG tablet Take  1 tablet (1,000 mg total) by mouth 2 (two) times daily with a meal. 11/11/17   [name], MD  metoprolol tartrate (LOPRESSOR) 50 MG tablet Take 1 tablet (50 mg total) by mouth 2 (two) times daily. 11/11/17   [name], MD  Multiple Vitamin (MULTIVITAMIN) tablet Take 1 tablet by mouth daily. COMPLETE SENIOR VITAMINS AND M    [provider]  Omega-3 Fatty Acids (FISH OIL) 1000 MG CAPS Take 2 capsules by mouth daily.     [provider]  Vitamin D, Cholecalciferol, 1000 UNITS TABS Take 1 tablet by mouth daily.     [provider]   Social History   Socioeconomic History  . Marital status: Legally Separated    Spouse name: Not on file  . Number of children: 7  . Years of education: Not on file  . Highest education level: Not on file  Occupational History  . Occupation: RETIRED    Employer: RETIRED  Social Needs  . Financial resource strain: Not on file  . Food insecurity:    Worry: Not on file    Inability: Not on file  . Transportation needs:     Medical: Not on file    Non-medical: Not on file  Tobacco Use  . Smoking status: Former Smoker    Packs/day: 0.00    Years: 15.00    Pack years: 0.00    Types: Cigarettes    Last attempt to quit: 05/10/2017    Years since quitting: 0.7  . Smokeless tobacco: Never Used  . Tobacco comment: Patient smokes occasionally. Not everyday.  Substance and Sexual Activity  . Alcohol use: No  . Drug use: No  . Sexual activity: Never  Lifestyle  . Physical activity:    Days per week: Not on file    Minutes per session: Not on file  . Stress: Not on file  Relationships  . Social connections:    Talks on phone: Not on file    Gets together: Not on file    Attends religious service: Not on file    Active member of club or organization: Not on file    Attends meetings of clubs or organizations: Not on file    Relationship status: Not on file  . Intimate partner violence:    Fear of current or ex partner: Not on file    Emotionally abused: Not on file    Physically abused: Not on file    Forced sexual activity: Not on file  Other Topics Concern  . Not on file  Social History Narrative   29 grandchildren. Single. Education: 12 th grade. Exercise: 3-4 times a week for 30 minutes working out and set ups.   Review of Systems  Constitutional: Negative for chills, fatigue, fever and unexpected weight change.  HENT: Positive for rhinorrhea (occasional). Negative for congestion and sore throat.   Eyes: Negative for visual disturbance.  Respiratory: Positive for cough. Negative for chest tightness and shortness of breath.   Cardiovascular: Negative for chest pain, palpitations and leg swelling.  Gastrointestinal: Negative for abdominal pain and blood in stool.  Musculoskeletal: Negative for myalgias.  Neurological: Negative for dizziness, light-headedness and headaches.      Objective:   Physical Exam  Constitutional: He is oriented to person, place, and time. He appears well-developed and  well-nourished.  HENT:  Head: Normocephalic and atraumatic.  Nose: Mucosal edema (bilat) present.  Mouth/Throat: Oropharynx is clear and moist and mucous membranes are normal.  Eyes: Pupils are equal, round, and reactive to light. EOM are normal.  Neck: No JVD present. Carotid bruit is not present.  Cardiovascular: Normal rate, regular rhythm and normal heart sounds.  No murmur heard. Pulmonary/Chest: Effort normal and breath sounds normal. He has no rales.  Musculoskeletal: He exhibits edema (trace non-pitting edema at ankles).  Neurological: He is alert and oriented to person, place, and time.  Skin: Skin is warm and dry.  Psychiatric: He has a normal mood and affect.  Vitals reviewed.  Vitals:   02/13/18 0803 02/13/18 0810 02/13/18 0901  BP: (!) 177/71 (!) 164/82 (!) 160/70  Pulse: 78    Temp: 98.7 F (37.1 C)    TempSrc: Oral    SpO2: 98%    Weight: 230 lb (104.3 kg)    Height: 6' (1.829 m)        Assessment & Plan:   [name] is a 84 y.o. male Type 2 diabetes mellitus with microalbuminuria, with long-term current use of insulin ([place]) - Plan: POCT glycosylated hemoglobin (Hb A1C), metFORMIN (GLUCOPHAGE) 1000 MG tablet, insulin NPH-regular Human (NOVOLIN 70/30) (70-30) 100 UNIT/ML injection  -Improved control.  Based on his age 32.5 should be a reasonable A1c goal.  He is right at 7.5 today.  Will continue same dose of insulin, continue to watch diet, recheck levels in 3 months.  Essential hypertension - Plan: lisinopril-hydrochlorothiazide (PRINZIDE,ZESTORETIC) 20-25 MG tablet, metoprolol tartrate (LOPRESSOR) 50 MG tablet, amLODipine (NORVASC) 5 MG tablet  -Elevated readings, may be related in part due to new medication for urinary symptoms.  Increase amlodipine to 7.5 mg daily, continue same doses of other meds, labs pending.  Hyperlipidemia, unspecified hyperlipidemia type - Plan: Comprehensive metabolic panel, Lipid panel, atorvastatin (LIPITOR) 10 MG tablet  -Check  lipid panel, continue same dose of Lipitor for now as currently tolerating  Cough Allergic rhinitis, unspecified seasonality, unspecified trigger  -Recent chest x-ray without concerning findings.  Suspected postnasal drip from allergies triggering cough.  Trial of Flonase over-the-counter or the Astelin nasal spray that has been prescribed previously.  RTC precautions if symptoms persist.  Meds ordered this encounter  Medications  . atorvastatin (LIPITOR) 10 MG tablet    Sig: Take 1 tablet (10 mg total) by mouth daily.    Dispense:  90 tablet    Refill:  1  . lisinopril-hydrochlorothiazide (PRINZIDE,ZESTORETIC) 20-25 MG tablet    Sig: Take 1 tablet by mouth daily.    Dispense:  90 tablet    Refill:  1  . metFORMIN (GLUCOPHAGE) 1000 MG tablet    Sig: Take 1 tablet (1,000 mg total) by mouth 2 (two) times daily with a meal.    Dispense:  180 tablet    Refill:  1  . metoprolol tartrate (LOPRESSOR) 50 MG tablet    Sig: Take 1 tablet (50 mg total) by mouth 2 (two) times daily.    Dispense:  180 tablet    Refill:  1  . amLODipine (NORVASC) 5 MG tablet    Sig: Take 1.5 tablets (7.5 mg total) by mouth daily.    Dispense:  135 tablet    Refill:  1  . insulin NPH-regular Human (NOVOLIN 70/30) (70-30) 100 UNIT/ML injection    Sig: ADMINISTER 8 UNITS UNDER THE SKIN TWICE DAILY WITH A MEAL    Dispense:  15 mL    Refill:  2   Patient Instructions    Hemoglobin A1c is improved from last visit, but still borderline control.  For now  can remain at 8 units twice per day, but watch diet.   Continue metformin same dose for now.  As you increase activity and are able to exercise more, I anticipate improved control.   Try flonase nasal spray or astelin nasal spray to help with nasal congestion.  That may be cause of cough.  If cough does not improve with treating allergies, we can look into other causes.   Blood pressure is running too high. Try increasing the amlodipine to 7.68m per day (one and  1/2 of the amlodipine 576mpills per day).  Thanks for coming in today.   If you have lab work done today you will be contacted with your lab results within the next 2 weeks.  If you have not heard from us[place]orea[place] please contact usKoreaThe fastest way to get your results is to register for My Chart.   IF you received an x-ray today, you will receive an invoice from [facility]. Please contact [facility] at [phone] questions or concerns regarding your invoice.   IF you received labwork [place] or twice per 0.5 okay so it is okay 5 for now control and go up as stated in the 1 exercise small culpable, you will receive an invoice from [place] at [phone] questions or concerns regarding your invoice.   Our billing staff will not be able to assist you with questions regarding bills from these companies.  You will be contacted with the lab results as soon as they are available. The fastest way to get your results is to activate your My Chart account. Instructions are located on the last page of this paperwork. If you have not heard from us[place]oreaegarding the results in 2 weeks, please contact this office.       I personally performed the services described in this documentation, which was scribed in my presence. The recorded information has been reviewed and considered for accuracy and completeness, addended by me as needed, and agree with information above.  Signed,   JeMerri RayMD [facility] 02/13/18 9:12 AM

## 2018-02-13 NOTE — Patient Instructions (Addendum)
  Hemoglobin A1c is improved from last visit, but still borderline control.  For now can remain at 8 units twice per day, but watch diet.   Continue metformin same dose for now.  As you increase activity and are able to exercise more, I anticipate improved control.   Try flonase nasal spray or astelin nasal spray to help with nasal congestion.  That may be cause of cough.  If cough does not improve with treating allergies, we can look into other causes.   Blood pressure is running too high. Try increasing the amlodipine to 7.5mg  per day (one and 1/2 of the amlodipine 5mg  pills per day).  Thanks for coming in today.   If you have lab work done today you will be contacted with your lab results within the next 2 weeks.  If you have not heard from [place] then please contact us. The fastest way to get your results is to register for My Chart.   IF you received an x-ray today, you will receive an invoice from [facility]. Please contact [facility] at [phone] with questions or concerns regarding your invoice.   IF you received labwork [place] or twice per 0.5 okay so it is okay 5 for now control and go up as stated in the 1 exercise small culpable, you will receive an invoice from [place]. Please contact [facility] at [phone] with questions or concerns regarding your invoice.   Our billing staff will not be able to assist you with questions regarding bills from these companies.  You will be contacted with the lab results as soon as they are available. The fastest way to get your results is to activate your My Chart account. Instructions are located on the last page of this paperwork. If you have not heard from [place] regarding the results in 2 weeks, please contact this office.

## 2018-02-19 NOTE — Progress Notes (Signed)
Please place orders in Epic as patient is being scheduled for a pre-op appointment! Thank you! 

## 2018-02-19 NOTE — Progress Notes (Signed)
Cardiac clearance Dr Quay Burow on chart  Medical clearance Dr [name] on chart  LOV cardio 02-03-18 epic   EKG 02-03-18 epic   CXR 01-12-18 epic   HgbA1c, CMP 02-13-18 epic

## 2018-02-19 NOTE — Patient Instructions (Addendum)
EFTON THOMLEY  02/19/2018   Your procedure is scheduled on: 03-04-18   Report to [facility] Main  Entrance    Report to admitting at 11:15AM    Call this number if you have problems the morning of surgery [phone]     Remember: Do not eat food After Midnight. YOU MAY HAVE CLEAR LIQUIDS FROM MIDNIGHT UNTIL 7:45AM. NOTHING BY MOUTH AFTER 7:45AM! BRUSH YOUR TEETH MORNING OF SURGERY AND RINSE YOUR MOUTH OUT, NO CHEWING GUM CANDY OR MINTS.     CLEAR LIQUID DIET   Foods Allowed                                                                     Foods Excluded  Coffee and tea, regular and decaf                             liquids that you cannot  Plain Jell-O in any flavor                                             see through such as: Fruit ices (not with fruit pulp)                                     milk, soups, orange juice  Iced Popsicles                                    All solid food Carbonated beverages, regular and diet                                    Cranberry, grape and apple juices Sports drinks like Gatorade Lightly seasoned clear broth or consume(fat free) Sugar, honey syrup  Sample Menu Breakfast                                Lunch                                     Supper Cranberry juice                    Beef broth                            Chicken broth Jell-O                                     Grape juice  Apple juice Coffee or tea                        Jell-O                                      Popsicle                                                Coffee or tea                        Coffee or tea  _____________________________________________________________________       Take these medicines the morning of surgery with A SIP OF WATER: AMLODIPINE, METOPROLOL, ATORVASTATIN, BUSPIRONE  DO NOT TAKE ANY DIABETIC MEDICATIONS DAY OF YOUR SURGERY!!                               You may not  have any metal on your body including hair pins and              piercings  Do not wear jewelry, make-up, lotions, powders or perfumes, deodorant                   Men may shave face and neck.   Do not bring valuables to the hospital. [place].  Contacts, dentures or bridgework may not be worn into surgery.  Leave suitcase in the car. After surgery it may be brought to your room.                Please read over the following fact sheets you were given: _____________________________________________________________________             [facility] - Preparing for Surgery Before surgery, you can play an important role.  Because skin is not sterile, your skin needs to be as free of germs as possible.  You can reduce the number of germs on your skin by washing with CHG (chlorahexidine gluconate) soap before surgery.  CHG is an antiseptic cleaner which kills germs and bonds with the skin to continue killing germs even after washing. Please DO NOT use if you have an allergy to CHG or antibacterial soaps.  If your skin becomes reddened/irritated stop using the CHG and inform your nurse when you arrive at Short Stay. Do not shave (including legs and underarms) for at least 48 hours prior to the first CHG shower.  You may shave your face/neck. Please follow these instructions carefully:  1.  Shower with CHG Soap the night before surgery and the  morning of Surgery.  2.  If you choose to wash your hair, wash your hair first as usual with your  normal  shampoo.  3.  After you shampoo, rinse your hair and body thoroughly to remove the  shampoo.                           4.  Use CHG as you would any other liquid soap.  You can apply chg directly  to the skin and wash                       Gently with a scrungie or clean washcloth.  5.  Apply the CHG Soap to your body ONLY FROM THE NECK DOWN.   Do not use on face/ open                           Wound or  open sores. Avoid contact with eyes, ears mouth and genitals (private parts).                       Wash face,  Genitals (private parts) with your normal soap.             6.  Wash thoroughly, paying special attention to the area where your surgery  will be performed.  7.  Thoroughly rinse your body with warm water from the neck down.  8.  DO NOT shower/wash with your normal soap after using and rinsing off  the CHG Soap.                9.  Pat yourself dry with a clean towel.            10.  Wear clean pajamas.            11.  Place clean sheets on your bed the night of your first shower and do not  sleep with pets. Day of Surgery : Do not apply any lotions/deodorants the morning of surgery.  Please wear clean clothes to the hospital/surgery center.  FAILURE TO FOLLOW THESE INSTRUCTIONS MAY RESULT IN THE CANCELLATION OF YOUR SURGERY PATIENT SIGNATURE_________________________________  NURSE SIGNATURE__________________________________  ________________________________________________________________________     Adam Phenix  An incentive spirometer is a tool that can help keep your lungs clear and active. This tool measures how well you are filling your lungs with each breath. Taking long deep breaths may help reverse or decrease the chance of developing breathing (pulmonary) problems (especially infection) following:  A long period of time when you are unable to move or be active. BEFORE THE PROCEDURE   If the spirometer includes an indicator to show your best effort, your nurse or respiratory therapist will set it to a desired goal.  If possible, sit up straight or lean slightly forward. Try not to slouch.  Hold the incentive spirometer in an upright position. INSTRUCTIONS FOR USE  1. Sit on the edge of your bed if possible, or sit up as far as you can in bed or on a chair. 2. Hold the incentive spirometer in an upright position. 3. Breathe out normally. 4. Place the  mouthpiece in your mouth and seal your lips tightly around it. 5. Breathe in slowly and as deeply as possible, raising the piston or the ball toward the top of the column. 6. Hold your breath for 3-5 seconds or for as long as possible. Allow the piston or ball to fall to the bottom of the column. 7. Remove the mouthpiece from your mouth and breathe out normally. 8. Rest for a few seconds and repeat Steps 1 through 7 at least 10 times every 1-2 hours when you are awake. Take your time and take a few normal breaths between deep breaths. 9. The spirometer may include an indicator to show your best effort. Use the indicator as a goal to work toward during each repetition.  10. After each set of 10 deep breaths, practice coughing to be sure your lungs are clear. If you have an incision (the cut made at the time of surgery), support your incision when coughing by placing a pillow or rolled up towels firmly against it. Once you are able to get out of bed, walk around indoors and cough well. You may stop using the incentive spirometer when instructed by your caregiver.  RISKS AND COMPLICATIONS  Take your time so you do not get dizzy or light-headed.  If you are in pain, you may need to take or ask for pain medication before doing incentive spirometry. It is harder to take a deep breath if you are having pain. AFTER USE  Rest and breathe slowly and easily.  It can be helpful to keep track of a log of your progress. Your caregiver can provide you with a simple table to help with this. If you are using the spirometer at home, follow these instructions: [place] IF:   You are having difficultly using the spirometer.  You have trouble using the spirometer as often as instructed.  Your pain medication is not giving enough relief while using the spirometer.  You develop fever of 100.5 F (38.1 C) or higher. SEEK IMMEDIATE MEDICAL CARE IF:   You cough up bloody sputum that had not been present  before.  You develop fever of 102 F (38.9 C) or greater.  You develop worsening pain at or near the incision site. MAKE SURE YOU:   Understand these instructions.  Will watch your condition.  Will get help right away if you are not doing well or get worse. Document Released: 08/05/2006 Document Revised: 06/17/2011 Document Reviewed: 10/06/2006 ExitCare Patient Information 2014 Exit[employer], [place].   ________________________________________________________________________  WHAT IS A BLOOD TRANSFUSION? Blood Transfusion Information  A transfusion is the replacement of blood or some of its parts. Blood is made up of multiple cells which provide different functions.  Red blood cells carry oxygen and are used for blood loss replacement.  White blood cells fight against infection.  Platelets control bleeding.  Plasma helps clot blood.  Other blood products are available for specialized needs, such as hemophilia or other clotting disorders. BEFORE THE TRANSFUSION  Who gives blood for transfusions?   Healthy volunteers who are fully evaluated to make sure their blood is safe. This is blood bank blood. Transfusion therapy is the safest it has ever been in the practice of medicine. Before blood is taken from a donor, a complete history is taken to make sure that person has no history of diseases nor engages in risky social behavior (examples are intravenous drug use or sexual activity with multiple partners). The donor's travel history is screened to minimize risk of transmitting infections, such as malaria. The donated blood is tested for signs of infectious diseases, such as HIV and hepatitis. The blood is then tested to be sure it is compatible with you in order to minimize the chance of a transfusion reaction. If you or a relative donates blood, this is often done in anticipation of surgery and is not appropriate for emergency situations. It takes many days to process the donated  blood. RISKS AND COMPLICATIONS Although transfusion therapy is very safe and saves many lives, the main dangers of transfusion include:   Getting an infectious disease.  Developing a transfusion reaction. This is an allergic reaction to something in the blood you were given. Every precaution is taken to prevent this. The  decision to have a blood transfusion has been considered carefully by your caregiver before blood is given. Blood is not given unless the benefits outweigh the risks. AFTER THE TRANSFUSION  Right after receiving a blood transfusion, you will usually feel much better and more energetic. This is especially true if your red blood cells have gotten low (anemic). The transfusion raises the level of the red blood cells which carry oxygen, and this usually causes an energy increase.  The nurse administering the transfusion will monitor you carefully for complications. HOME CARE INSTRUCTIONS  No special instructions are needed after a transfusion. You may find your energy is better. Speak with your caregiver about any limitations on activity for underlying diseases you may have. SEEK MEDICAL CARE IF:   Your condition is not improving after your transfusion.  You develop redness or irritation at the intravenous (IV) site. SEEK IMMEDIATE MEDICAL CARE IF:  Any of the following symptoms occur over the next 12 hours:  Shaking chills.  You have a temperature by mouth above 102 F (38.9 C), not controlled by medicine.  Chest, back, or muscle pain.  People around you feel you are not acting correctly or are confused.  Shortness of breath or difficulty breathing.  Dizziness and fainting.  You get a rash or develop hives.  You have a decrease in urine output.  Your urine turns a dark color or changes to pink, red, or brown. Any of the following symptoms occur over the next 10 days:  You have a temperature by mouth above 102 F (38.9 C), not controlled by  medicine.  Shortness of breath.  Weakness after normal activity.  The white part of the eye turns yellow (jaundice).  You have a decrease in the amount of urine or are urinating less often.  Your urine turns a dark color or changes to pink, red, or brown. Document Released: 03/22/2000 Document Revised: 06/17/2011 Document Reviewed: 11/09/2007 [facility] Patient Information 2014 [place], [place].  _______________________________________________________________________

## 2018-02-20 ENCOUNTER — Ambulatory Visit: Payer: Self-pay | Admitting: Orthopedic Surgery

## 2018-02-20 NOTE — H&P (Signed)
TOTAL KNEE ADMISSION H&P  Patient is being admitted for right total knee arthroplasty.  Subjective:  Chief Complaint:right knee pain.  HPI: [name], 84 y.o. male, has a history of pain and functional disability in the right knee due to arthritis and has failed non-surgical conservative treatments for greater than 12 weeks to includeNSAID's and/or analgesics, corticosteriod injections, flexibility and strengthening excercises, use of assistive devices, weight reduction as appropriate and activity modification.  Onset of symptoms was gradual, starting >10 years ago with gradually worsening course since that time. The patient noted no past surgery on the right knee(s).  Patient currently rates pain in the right knee(s) at 10 out of 10 with activity. Patient has night pain, worsening of pain with activity and weight bearing, pain that interferes with activities of daily living, pain with passive range of motion, crepitus and joint swelling.  Patient has evidence of subchondral cysts, subchondral sclerosis, periarticular osteophytes and joint space narrowing by imaging studies.  There is no active infection.  Patient Active Problem List   Diagnosis Date Noted  . Rib pain on left side 07/01/2016  . Fall 12/14/2015  . Elevated serum creatinine 12/14/2015  . Ileus ([place]) 12/14/2015  . Multiple fractures of ribs of left side 12/12/2015  . Prostate cancer ([place]) 05/26/2014  . Blood in the urine 12/01/2013  . Cystitis, radiation 12/01/2013  . Hematuria 12/01/2013  . 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 Malignant neoplasm of prostate ([place]) 11/26/2013  . Male erectile dysfunction 11/26/2013  . Pulsatile tinnitus 11/19/2013  . Rectal bleeding 12/23/2012  . Type II or unspecified type diabetes mellitus without mention of complication, not stated as uncontrolled   . Essential hypertension, benign   . Chronic back pain   . ED (erectile dysfunction)   . Anemia   . Hyperlipidemia   . CAD, NATIVE  VESSEL 05/08/2008  . VENTRICULAR TACHYCARDIA 05/08/2008  . PREMATURE VENTRICULAR CONTRACTIONS 05/08/2008   Past Medical History:  Diagnosis Date  . Allergy   . CAD (coronary artery disease)    pci to LAD 10/09; stable CAD by cath 05/31/08; myoview 04/11/11- no ishcemia, EF 67%; echo 05/15/09- EF>55%, mod calcification of the aortic valve leaflets  . Chronic back pain   . ED (erectile dysfunction)   . Essential hypertension, benign   . Hyperlipidemia   . Multiple rib fractures    left 9th, 10th, and 11th posterior rib fractures S/P fall 12/09/2015/notes 12/12/2015  . Prostate cancer ([place])    s/p  . Type II or unspecified type diabetes mellitus without mention of complication, not stated as uncontrolled   . Ventricular tachycardia ([place])    resuscitated; monitor 05/2008; attempted T ablation 2/10- aborted due to inappropriate substrate    Past Surgical History:  Procedure Laterality Date  . CARDIAC CATHETERIZATION  05/31/08   EF 55-60%, stable lesions, medical therapy  . COLECTOMY  '80's   polyps  . CORONARY ANGIOPLASTY WITH STENT PLACEMENT  01/19/08   PCI stent to mid LAD with Promus DES 27.5x28  . IR GENERIC HISTORICAL  01/03/2016   IR THORACENTESIS ASP PLEURAL SPACE W/IMG GUIDE 01/03/2016 [facility]-INTERV RAD  . PTCA  01/2008    Current Outpatient Medications  Medication Sig Dispense Refill Last Dose  . amLODipine (NORVASC) 5 MG tablet Take 1.5 tablets (7.5 mg total) by mouth daily. 135 tablet 1   . atorvastatin (LIPITOR) 10 MG tablet Take 1 tablet (10 mg total) by mouth daily. 90 tablet 1   . azelastine (ASTELIN) 0.1 % nasal  spray Place 2 sprays into both nostrils 2 (two) times daily. Use in each nostril as directed 30 mL 0 Taking  . Blood Glucose Monitoring Suppl (ACCU-CHEK AVIVA PLUS) w/Device KIT Test blood sugar 3 times daily. Dx code: E11.22 1 kit 0 Taking  . busPIRone (BUSPAR) 5 MG tablet Take 0.5-2 tablets (2.5-10 mg total) by mouth 3 (three) times daily. 30 tablet 0 Taking  .  ciprofloxacin (CILOXAN) 0.3 % ophthalmic solution Place 1 drop into both eyes every 4 (four) hours while awake. 10 mL 0 Taking  . clotrimazole-betamethasone (LOTRISONE) cream Apply 1 application topically 2 (two) times daily. 30 g 0 Taking  . ferrous sulfate 325 (65 FE) MG tablet Take 325 mg by mouth daily with breakfast.   Taking  . GAVILYTE-G 236 g solution    Taking  . glucose blood (ACCU-CHEK AVIVA PLUS) test strip Test blood sugar 3 times daily. Dx code: E11.22 300 each 3 Taking  . insulin NPH-regular Human (NOVOLIN 70/30) (70-30) 100 UNIT/ML injection ADMINISTER 8 UNITS UNDER THE SKIN TWICE DAILY WITH A MEAL 15 mL 2   . Insulin Syringe-Needle U-100 (B-D INS SYR HALF-UNIT .3CC/31G) 31G X 5/16" 0.3 ML MISC Use daily at bedtime to inject insulin. Dx code: 250.00 100 each 3 Taking  . Lancets (ACCU-CHEK MULTICLIX) lancets Test blood sugar 3 times daily. Dx code: E11.22 300 each 3 Taking  . lisinopril-hydrochlorothiazide (PRINZIDE,ZESTORETIC) 20-25 MG tablet Take 1 tablet by mouth daily. 90 tablet 1   . metFORMIN (GLUCOPHAGE) 1000 MG tablet Take 1 tablet (1,000 mg total) by mouth 2 (two) times daily with a meal. 180 tablet 1   . metoprolol tartrate (LOPRESSOR) 50 MG tablet Take 1 tablet (50 mg total) by mouth 2 (two) times daily. 180 tablet 1   . mirabegron ER (MYRBETRIQ) 25 MG TB24 tablet Take 25 mg by mouth daily.   Taking  . Multiple Vitamin (MULTIVITAMIN) tablet Take 1 tablet by mouth daily. COMPLETE SENIOR VITAMINS AND M   Taking  . Omega-3 Fatty Acids (FISH OIL) 1000 MG CAPS Take 2 capsules by mouth daily.    Taking  . Vitamin D, Cholecalciferol, 1000 UNITS TABS Take 1 tablet by mouth daily.    Taking   No current facility-administered medications for this visit.    Allergies  Allergen Reactions  . Contrast Media [Iodinated Diagnostic Agents] Swelling    Possible neck swelling after contrast for chest CT 03/2016. No hives, throat or other symptoms    Social History   Tobacco Use  .  Smoking status: Former Smoker    Packs/day: 0.00    Years: 15.00    Pack years: 0.00    Types: Cigarettes    Last attempt to quit: 05/10/2017    Years since quitting: 0.7  . Smokeless tobacco: Never Used  . Tobacco comment: Patient smokes occasionally. Not everyday.  Substance Use Topics  . Alcohol use: No    Family History  Problem Relation Age of Onset  . Diabetes Mother   . Heart attack Mother   . Leukemia Father   . Heart disease Sister   . Heart disease Brother   . Diabetes Brother        pancreatic cancer     Review of Systems  Constitutional: Negative.   HENT: Negative.   Eyes: Negative.   Respiratory: Positive for shortness of breath.   Gastrointestinal: Negative.   Genitourinary: Positive for frequency and urgency.  Musculoskeletal: Positive for back pain and joint pain.  Skin: Negative.  Neurological: Negative.   Endo/Heme/Allergies: Negative.   Psychiatric/Behavioral: Negative.     Objective:  Physical Exam  Constitutional: He is oriented to person, place, and time. He appears well-developed and well-nourished.  HENT:  Head: Normocephalic and atraumatic.  Eyes: Pupils are equal, round, and reactive to light. Conjunctivae and EOM are normal.  Neck: Normal range of motion. Neck supple.  Cardiovascular: Normal rate, regular rhythm and intact distal pulses.  Respiratory: Effort normal. No respiratory distress.  GI: Soft. He exhibits no distension.  Genitourinary:  Genitourinary Comments: deferred  Musculoskeletal:       Right knee: He exhibits decreased range of motion.  Neurological: He is alert and oriented to person, place, and time. He has normal reflexes.  Skin: Skin is warm and dry.  Psychiatric: He has a normal mood and affect. His behavior is normal. Judgment and thought content normal.    Vital signs in last 24 hours: @VSRANGES @  Labs:   Estimated body mass index is 31.19 kg/m as calculated from the following:   Height as of 02/13/18: 6'  (1.829 m).   Weight as of 02/13/18: 104.3 kg.   Imaging Review Plain radiographs demonstrate severe degenerative joint disease of the right knee(s). The overall alignment issignificant varus. The bone quality appears to be adequate for age and reported activity level.   Preoperative templating of the joint replacement has been completed, documented, and submitted to the Operating Room personnel in order to optimize intra-operative equipment management.   Anticipated LOS equal to or greater than 2 midnights due to - Age 40 and older with one or more of the following:  - Obesity  - Expected need for hospital services (PT, OT, Nursing) required for safe  discharge  - Anticipated need for postoperative skilled nursing care or inpatient rehab  - Active co-morbidities: Diabetes OR   - Unanticipated findings during/Post Surgery: None  - Patient is a high risk of re-admission due to: None     Assessment/Plan:  End stage arthritis, right knee with stiffness  The patient history, physical examination, clinical judgment of the provider and imaging studies are consistent with end stage degenerative joint disease of the right knee(s) and total knee arthroplasty is deemed medically necessary. The treatment options including medical management, injection therapy arthroscopy and arthroplasty were discussed at length. The risks and benefits of total knee arthroplasty were presented and reviewed. The risks due to aseptic loosening, infection, stiffness, patella tracking problems, thromboembolic complications and other imponderables were discussed. The patient acknowledged the explanation, agreed to proceed with the plan and consent was signed. Patient is being admitted for inpatient treatment for surgery, pain control, PT, OT, prophylactic antibiotics, VTE prophylaxis, progressive ambulation and ADL's and discharge planning. The patient is planning to be discharged home with outpatient PT at [facility].

## 2018-02-20 NOTE — H&P (View-Only) (Signed)
TOTAL KNEE ADMISSION H&P  Patient is being admitted for right total knee arthroplasty.  Subjective:  Chief Complaint:right knee pain.  HPI: KOUPER SPINELLA, 84 y.o. male, has a history of pain and functional disability in the right knee due to arthritis and has failed non-surgical conservative treatments for greater than 12 weeks to includeNSAID's and/or analgesics, corticosteriod injections, flexibility and strengthening excercises, use of assistive devices, weight reduction as appropriate and activity modification.  Onset of symptoms was gradual, starting >10 years ago with gradually worsening course since that time. The patient noted no past surgery on the right knee(s).  Patient currently rates pain in the right knee(s) at 10 out of 10 with activity. Patient has night pain, worsening of pain with activity and weight bearing, pain that interferes with activities of daily living, pain with passive range of motion, crepitus and joint swelling.  Patient has evidence of subchondral cysts, subchondral sclerosis, periarticular osteophytes and joint space narrowing by imaging studies.  There is no active infection.  Patient Active Problem List   Diagnosis Date Noted  . Rib pain on left side 07/01/2016  . Fall 12/14/2015  . Elevated serum creatinine 12/14/2015  . Ileus ([place]) 12/14/2015  . Multiple fractures of ribs of left side 12/12/2015  . Prostate cancer ([place]) 05/26/2014  . Blood in the urine 12/01/2013  . Cystitis, radiation 12/01/2013  . Hematuria 12/01/2013  . 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 Malignant neoplasm of prostate ([place]) 11/26/2013  . Male erectile dysfunction 11/26/2013  . Pulsatile tinnitus 11/19/2013  . Rectal bleeding 12/23/2012  . Type II or unspecified type diabetes mellitus without mention of complication, not stated as uncontrolled   . Essential hypertension, benign   . Chronic back pain   . ED (erectile dysfunction)   . Anemia   . Hyperlipidemia   . CAD, NATIVE  VESSEL 05/08/2008  . VENTRICULAR TACHYCARDIA 05/08/2008  . PREMATURE VENTRICULAR CONTRACTIONS 05/08/2008   Past Medical History:  Diagnosis Date  . Allergy   . CAD (coronary artery disease)    pci to LAD 10/09; stable CAD by cath 05/31/08; myoview 04/11/11- no ishcemia, EF 67%; echo 05/15/09- EF>55%, mod calcification of the aortic valve leaflets  . Chronic back pain   . ED (erectile dysfunction)   . Essential hypertension, benign   . Hyperlipidemia   . Multiple rib fractures    left 9th, 10th, and 11th posterior rib fractures S/P fall 12/09/2015/notes 12/12/2015  . Prostate cancer ([place])    s/p  . Type II or unspecified type diabetes mellitus without mention of complication, not stated as uncontrolled   . Ventricular tachycardia ([place])    resuscitated; monitor 05/2008; attempted T ablation 2/10- aborted due to inappropriate substrate    Past Surgical History:  Procedure Laterality Date  . CARDIAC CATHETERIZATION  05/31/08   EF 55-60%, stable lesions, medical therapy  . COLECTOMY  '80's   polyps  . CORONARY ANGIOPLASTY WITH STENT PLACEMENT  01/19/08   PCI stent to mid LAD with Promus DES 27.5x28  . IR GENERIC HISTORICAL  01/03/2016   IR THORACENTESIS ASP PLEURAL SPACE W/IMG GUIDE 01/03/2016 [facility]-INTERV RAD  . PTCA  01/2008    Current Outpatient Medications  Medication Sig Dispense Refill Last Dose  . amLODipine (NORVASC) 5 MG tablet Take 1.5 tablets (7.5 mg total) by mouth daily. 135 tablet 1   . atorvastatin (LIPITOR) 10 MG tablet Take 1 tablet (10 mg total) by mouth daily. 90 tablet 1   . azelastine (ASTELIN) 0.1 % nasal  spray Place 2 sprays into both nostrils 2 (two) times daily. Use in each nostril as directed 30 mL 0 Taking  . Blood Glucose Monitoring Suppl (ACCU-CHEK AVIVA PLUS) w/Device KIT Test blood sugar 3 times daily. Dx code: E11.22 1 kit 0 Taking  . busPIRone (BUSPAR) 5 MG tablet Take 0.5-2 tablets (2.5-10 mg total) by mouth 3 (three) times daily. 30 tablet 0 Taking  .  ciprofloxacin (CILOXAN) 0.3 % ophthalmic solution Place 1 drop into both eyes every 4 (four) hours while awake. 10 mL 0 Taking  . clotrimazole-betamethasone (LOTRISONE) cream Apply 1 application topically 2 (two) times daily. 30 g 0 Taking  . ferrous sulfate 325 (65 FE) MG tablet Take 325 mg by mouth daily with breakfast.   Taking  . GAVILYTE-G 236 g solution    Taking  . glucose blood (ACCU-CHEK AVIVA PLUS) test strip Test blood sugar 3 times daily. Dx code: E11.22 300 each 3 Taking  . insulin NPH-regular Human (NOVOLIN 70/30) (70-30) 100 UNIT/ML injection ADMINISTER 8 UNITS UNDER THE SKIN TWICE DAILY WITH A MEAL 15 mL 2   . Insulin Syringe-Needle U-100 (B-D INS SYR HALF-UNIT .3CC/31G) 31G X 5/16" 0.3 ML MISC Use daily at bedtime to inject insulin. Dx code: 250.00 100 each 3 Taking  . Lancets (ACCU-CHEK MULTICLIX) lancets Test blood sugar 3 times daily. Dx code: E11.22 300 each 3 Taking  . lisinopril-hydrochlorothiazide (PRINZIDE,ZESTORETIC) 20-25 MG tablet Take 1 tablet by mouth daily. 90 tablet 1   . metFORMIN (GLUCOPHAGE) 1000 MG tablet Take 1 tablet (1,000 mg total) by mouth 2 (two) times daily with a meal. 180 tablet 1   . metoprolol tartrate (LOPRESSOR) 50 MG tablet Take 1 tablet (50 mg total) by mouth 2 (two) times daily. 180 tablet 1   . mirabegron ER (MYRBETRIQ) 25 MG TB24 tablet Take 25 mg by mouth daily.   Taking  . Multiple Vitamin (MULTIVITAMIN) tablet Take 1 tablet by mouth daily. COMPLETE SENIOR VITAMINS AND M   Taking  . Omega-3 Fatty Acids (FISH OIL) 1000 MG CAPS Take 2 capsules by mouth daily.    Taking  . Vitamin D, Cholecalciferol, 1000 UNITS TABS Take 1 tablet by mouth daily.    Taking   No current facility-administered medications for this visit.    Allergies  Allergen Reactions  . Contrast Media [Iodinated Diagnostic Agents] Swelling    Possible neck swelling after contrast for chest CT 03/2016. No hives, throat or other symptoms    Social History   Tobacco Use  .  Smoking status: Former Smoker    Packs/day: 0.00    Years: 15.00    Pack years: 0.00    Types: Cigarettes    Last attempt to quit: 05/10/2017    Years since quitting: 0.7  . Smokeless tobacco: Never Used  . Tobacco comment: Patient smokes occasionally. Not everyday.  Substance Use Topics  . Alcohol use: No    Family History  Problem Relation Age of Onset  . Diabetes Mother   . Heart attack Mother   . Leukemia Father   . Heart disease Sister   . Heart disease Brother   . Diabetes Brother        pancreatic cancer     Review of Systems  Constitutional: Negative.   HENT: Negative.   Eyes: Negative.   Respiratory: Positive for shortness of breath.   Gastrointestinal: Negative.   Genitourinary: Positive for frequency and urgency.  Musculoskeletal: Positive for back pain and joint pain.  Skin: Negative.  Neurological: Negative.   Endo/Heme/Allergies: Negative.   Psychiatric/Behavioral: Negative.     Objective:  Physical Exam  Constitutional: He is oriented to person, place, and time. He appears well-developed and well-nourished.  HENT:  Head: Normocephalic and atraumatic.  Eyes: Pupils are equal, round, and reactive to light. Conjunctivae and EOM are normal.  Neck: Normal range of motion. Neck supple.  Cardiovascular: Normal rate, regular rhythm and intact distal pulses.  Respiratory: Effort normal. No respiratory distress.  GI: Soft. He exhibits no distension.  Genitourinary:  Genitourinary Comments: deferred  Musculoskeletal:       Right knee: He exhibits decreased range of motion.  Neurological: He is alert and oriented to person, place, and time. He has normal reflexes.  Skin: Skin is warm and dry.  Psychiatric: He has a normal mood and affect. His behavior is normal. Judgment and thought content normal.    Vital signs in last 24 hours: @VSRANGES @  Labs:   Estimated body mass index is 31.19 kg/m as calculated from the following:   Height as of 02/13/18: 6'  (1.829 m).   Weight as of 02/13/18: 104.3 kg.   Imaging Review Plain radiographs demonstrate severe degenerative joint disease of the right knee(s). The overall alignment issignificant varus. The bone quality appears to be adequate for age and reported activity level.   Preoperative templating of the joint replacement has been completed, documented, and submitted to the Operating Room personnel in order to optimize intra-operative equipment management.   Anticipated LOS equal to or greater than 2 midnights due to - Age 32 and older with one or more of the following:  - Obesity  - Expected need for hospital services (PT, OT, Nursing) required for safe  discharge  - Anticipated need for postoperative skilled nursing care or inpatient rehab  - Active co-morbidities: Diabetes OR   - Unanticipated findings during/Post Surgery: None  - Patient is a high risk of re-admission due to: None     Assessment/Plan:  End stage arthritis, right knee with stiffness  The patient history, physical examination, clinical judgment of the provider and imaging studies are consistent with end stage degenerative joint disease of the right knee(s) and total knee arthroplasty is deemed medically necessary. The treatment options including medical management, injection therapy arthroscopy and arthroplasty were discussed at length. The risks and benefits of total knee arthroplasty were presented and reviewed. The risks due to aseptic loosening, infection, stiffness, patella tracking problems, thromboembolic complications and other imponderables were discussed. The patient acknowledged the explanation, agreed to proceed with the plan and consent was signed. Patient is being admitted for inpatient treatment for surgery, pain control, PT, OT, prophylactic antibiotics, VTE prophylaxis, progressive ambulation and ADL's and discharge planning. The patient is planning to be discharged home with outpatient PT at [facility].

## 2018-02-24 ENCOUNTER — Encounter: Payer: Self-pay | Admitting: Family Medicine

## 2018-02-24 NOTE — Progress Notes (Signed)
Lab letter has been sent via mail to patients home address

## 2018-02-25 ENCOUNTER — Other Ambulatory Visit: Payer: Self-pay

## 2018-02-25 ENCOUNTER — Encounter (HOSPITAL_COMMUNITY): Payer: Self-pay

## 2018-02-25 ENCOUNTER — Encounter (HOSPITAL_COMMUNITY)
Admission: RE | Admit: 2018-02-25 | Discharge: 2018-02-25 | Disposition: A | Discharge status: Home / Self Care | Payer: Medicare HMO | Source: Ambulatory Visit | Attending: Orthopedic Surgery | Admitting: Orthopedic Surgery

## 2018-02-25 DIAGNOSIS — Z01812 Encounter for preprocedural laboratory examination: Secondary | ICD-10-CM | POA: Diagnosis not present

## 2018-02-25 LAB — COMPREHENSIVE METABOLIC PANEL
ALT: 11 U/L (ref 0–44)
AST: 15 U/L (ref 15–41)
Albumin: 3.4 g/dL — ABNORMAL LOW (ref 3.5–5.0)
Alkaline Phosphatase: 35 U/L — ABNORMAL LOW (ref 38–126)
Anion gap: 10 (ref 5–15)
BUN: 26 mg/dL — ABNORMAL HIGH (ref 8–23)
CO2: 20 mmol/L — ABNORMAL LOW (ref 22–32)
Calcium: 8.9 mg/dL (ref 8.9–10.3)
Chloride: 108 mmol/L (ref 98–111)
Creatinine, Ser: 1.75 mg/dL — ABNORMAL HIGH (ref 0.61–1.24)
GFR calc Af Amer: 40 mL/min — ABNORMAL LOW (ref >60)
GFR calc non Af Amer: 35 mL/min — ABNORMAL LOW (ref >60)
Glucose, Bld: 134 mg/dL — ABNORMAL HIGH (ref 70–99)
Potassium: 4.2 mmol/L (ref 3.5–5.1)
Sodium: 138 mmol/L (ref 135–145)
Total Bilirubin: 0.5 mg/dL (ref 0.3–1.2)
Total Protein: 6.8 g/dL (ref 6.5–8.1)

## 2018-02-25 LAB — CBC
HCT: 38.2 % — ABNORMAL LOW (ref 39.0–52.0)
Hemoglobin: 11.8 g/dL — ABNORMAL LOW (ref 13.0–17.0)
MCH: 26.3 pg (ref 26.0–34.0)
MCHC: 30.9 g/dL (ref 30.0–36.0)
MCV: 85.3 fL (ref 80.0–100.0)
Platelets: 233 10*3/uL (ref 150–400)
RBC: 4.48 MIL/uL (ref 4.22–5.81)
RDW: 15.5 % (ref 11.5–15.5)
WBC: 6.2 10*3/uL (ref 4.0–10.5)
nRBC: 0 % (ref 0.0–0.2)

## 2018-02-25 LAB — ABO/RH: ABO/RH(D): A POS

## 2018-02-25 LAB — SURGICAL PCR SCREEN
MRSA, PCR: NEGATIVE
Staphylococcus aureus: NEGATIVE

## 2018-02-25 LAB — GLUCOSE, CAPILLARY: Glucose-Capillary: 128 mg/dL — ABNORMAL HIGH (ref 70–99)

## 2018-02-25 NOTE — Progress Notes (Signed)
CMP ROUTED VIA Epic TO DR Rod Can

## 2018-03-04 ENCOUNTER — Inpatient Hospital Stay (HOSPITAL_COMMUNITY)
Admission: RE | Admit: 2018-03-04 | Discharge: 2018-03-06 | DRG: 470 | Disposition: A | Discharge status: Home / Self Care | Payer: Medicare HMO | Attending: Orthopedic Surgery | Admitting: Orthopedic Surgery

## 2018-03-04 ENCOUNTER — Encounter (HOSPITAL_COMMUNITY): Payer: Self-pay | Admitting: *Deleted

## 2018-03-04 ENCOUNTER — Ambulatory Visit (HOSPITAL_COMMUNITY): Payer: Medicare HMO | Admitting: Anesthesiology

## 2018-03-04 ENCOUNTER — Encounter (HOSPITAL_COMMUNITY)
Admission: RE | Disposition: A | Discharge status: Home / Self Care | Payer: Self-pay | Source: Home / Self Care | Attending: Orthopedic Surgery

## 2018-03-04 ENCOUNTER — Other Ambulatory Visit: Payer: Self-pay

## 2018-03-04 ENCOUNTER — Inpatient Hospital Stay (HOSPITAL_COMMUNITY): Payer: Medicare HMO

## 2018-03-04 DIAGNOSIS — I10 Essential (primary) hypertension: Secondary | ICD-10-CM | POA: Diagnosis present

## 2018-03-04 DIAGNOSIS — E785 Hyperlipidemia, unspecified: Secondary | ICD-10-CM | POA: Diagnosis present

## 2018-03-04 DIAGNOSIS — F17210 Nicotine dependence, cigarettes, uncomplicated: Secondary | ICD-10-CM | POA: Diagnosis not present

## 2018-03-04 DIAGNOSIS — I2510 Atherosclerotic heart disease of native coronary artery without angina pectoris: Secondary | ICD-10-CM | POA: Diagnosis present

## 2018-03-04 DIAGNOSIS — D649 Anemia, unspecified: Secondary | ICD-10-CM | POA: Diagnosis not present

## 2018-03-04 DIAGNOSIS — E119 Type 2 diabetes mellitus without complications: Secondary | ICD-10-CM | POA: Diagnosis not present

## 2018-03-04 DIAGNOSIS — X58XXXA Exposure to other specified factors, initial encounter: Secondary | ICD-10-CM | POA: Diagnosis present

## 2018-03-04 DIAGNOSIS — Z800 Family history of malignant neoplasm of digestive organs: Secondary | ICD-10-CM

## 2018-03-04 DIAGNOSIS — Z833 Family history of diabetes mellitus: Secondary | ICD-10-CM | POA: Diagnosis not present

## 2018-03-04 DIAGNOSIS — S82831A Other fracture of upper and lower end of right fibula, initial encounter for closed fracture: Secondary | ICD-10-CM | POA: Diagnosis present

## 2018-03-04 DIAGNOSIS — Z794 Long term (current) use of insulin: Secondary | ICD-10-CM | POA: Diagnosis not present

## 2018-03-04 DIAGNOSIS — I700 Atherosclerosis of aorta: Secondary | ICD-10-CM | POA: Diagnosis present

## 2018-03-04 DIAGNOSIS — M549 Dorsalgia, unspecified: Secondary | ICD-10-CM | POA: Diagnosis present

## 2018-03-04 DIAGNOSIS — M1711 Unilateral primary osteoarthritis, right knee: Secondary | ICD-10-CM | POA: Diagnosis not present

## 2018-03-04 DIAGNOSIS — Z955 Presence of coronary angioplasty implant and graft: Secondary | ICD-10-CM | POA: Diagnosis not present

## 2018-03-04 DIAGNOSIS — G8929 Other chronic pain: Secondary | ICD-10-CM | POA: Diagnosis present

## 2018-03-04 DIAGNOSIS — Z79899 Other long term (current) drug therapy: Secondary | ICD-10-CM

## 2018-03-04 DIAGNOSIS — Z8249 Family history of ischemic heart disease and other diseases of the circulatory system: Secondary | ICD-10-CM

## 2018-03-04 DIAGNOSIS — J984 Other disorders of lung: Secondary | ICD-10-CM | POA: Diagnosis present

## 2018-03-04 DIAGNOSIS — Z806 Family history of leukemia: Secondary | ICD-10-CM

## 2018-03-04 DIAGNOSIS — Z96651 Presence of right artificial knee joint: Secondary | ICD-10-CM

## 2018-03-04 DIAGNOSIS — G8918 Other acute postprocedural pain: Secondary | ICD-10-CM | POA: Diagnosis not present

## 2018-03-04 HISTORY — PX: KNEE ARTHROPLASTY: SHX992

## 2018-03-04 HISTORY — DX: Unilateral primary osteoarthritis, right knee: M17.11

## 2018-03-04 LAB — TYPE AND SCREEN
ABO/RH(D): A POS
Antibody Screen: NEGATIVE

## 2018-03-04 LAB — GLUCOSE, CAPILLARY
Glucose-Capillary: 123 mg/dL — ABNORMAL HIGH (ref 70–99)
Glucose-Capillary: 108 mg/dL — ABNORMAL HIGH (ref 70–99)
Glucose-Capillary: 171 mg/dL — ABNORMAL HIGH (ref 70–99)

## 2018-03-04 SURGERY — ARTHROPLASTY, KNEE, TOTAL, USING IMAGELESS COMPUTER-ASSISTED NAVIGATION
Anesthesia: Regional | Laterality: Right

## 2018-03-04 MED ORDER — LIDOCAINE 2% (20 MG/ML) 5 ML SYRINGE
INTRAMUSCULAR | Status: DC | PRN
  Administered 2018-03-04: 50 mg via INTRAVENOUS

## 2018-03-04 MED ORDER — PROPOFOL 10 MG/ML IV BOLUS
INTRAVENOUS | Status: AC
  Filled 2018-03-04: qty 20

## 2018-03-04 MED ORDER — METOCLOPRAMIDE HCL 5 MG/ML IJ SOLN: 5.0000 mg | Freq: Three times a day (TID) | INTRAMUSCULAR | Status: DC | PRN

## 2018-03-04 MED ORDER — METHOCARBAMOL 500 MG PO TABS
500.0000 mg | ORAL_TABLET | Freq: Four times a day (QID) | ORAL | Status: DC | PRN
  Administered 2018-03-04 – 2018-03-05 (×3): 500 mg via ORAL
  Filled 2018-03-04 (×4): qty 1

## 2018-03-04 MED ORDER — METOCLOPRAMIDE HCL 5 MG PO TABS: 5.0000 mg | ORAL_TABLET | Freq: Three times a day (TID) | ORAL | Status: DC | PRN

## 2018-03-04 MED ORDER — SODIUM CHLORIDE 0.9 % IV SOLN
INTRAVENOUS | Status: DC
  Administered 2018-03-04 – 2018-03-05 (×2): via INTRAVENOUS

## 2018-03-04 MED ORDER — TRANEXAMIC ACID 1000 MG/10ML IV SOLN: INTRAVENOUS | Status: DC | PRN

## 2018-03-04 MED ORDER — ASPIRIN 81 MG PO CHEW
81.0000 mg | CHEWABLE_TABLET | Freq: Two times a day (BID) | ORAL | Status: DC
  Administered 2018-03-04 – 2018-03-06 (×4): 81 mg via ORAL
  Filled 2018-03-04 (×4): qty 1

## 2018-03-04 MED ORDER — INSULIN ASPART 100 UNIT/ML ~~LOC~~ SOLN
0.0000 [IU] | Freq: Three times a day (TID) | SUBCUTANEOUS | Status: DC
  Administered 2018-03-05 (×2): 2 [IU] via SUBCUTANEOUS
  Administered 2018-03-05: 3 [IU] via SUBCUTANEOUS

## 2018-03-04 MED ORDER — MENTHOL 3 MG MT LOZG: 1.0000 | LOZENGE | OROMUCOSAL | Status: DC | PRN

## 2018-03-04 MED ORDER — ONDANSETRON HCL 4 MG PO TABS: 4.0000 mg | ORAL_TABLET | Freq: Four times a day (QID) | ORAL | Status: DC | PRN

## 2018-03-04 MED ORDER — LISINOPRIL-HYDROCHLOROTHIAZIDE 20-25 MG PO TABS: 1.0000 | ORAL_TABLET | Freq: Every day | ORAL | Status: DC

## 2018-03-04 MED ORDER — ONDANSETRON HCL 4 MG/2ML IJ SOLN
INTRAMUSCULAR | Status: AC
  Filled 2018-03-04: qty 2

## 2018-03-04 MED ORDER — ONDANSETRON HCL 4 MG/2ML IJ SOLN: 4.0000 mg | Freq: Once | INTRAMUSCULAR | Status: DC | PRN

## 2018-03-04 MED ORDER — LISINOPRIL 20 MG PO TABS
20.0000 mg | ORAL_TABLET | Freq: Every day | ORAL | Status: DC
  Administered 2018-03-05: 20 mg via ORAL
  Filled 2018-03-04 (×2): qty 1

## 2018-03-04 MED ORDER — ONDANSETRON HCL 4 MG/2ML IJ SOLN: 4.0000 mg | Freq: Four times a day (QID) | INTRAMUSCULAR | Status: DC | PRN

## 2018-03-04 MED ORDER — AMLODIPINE BESYLATE 5 MG PO TABS
7.5000 mg | ORAL_TABLET | Freq: Every day | ORAL | Status: DC
  Administered 2018-03-05 – 2018-03-06 (×2): 7.5 mg via ORAL
  Filled 2018-03-04 (×2): qty 2

## 2018-03-04 MED ORDER — ACETAMINOPHEN 10 MG/ML IV SOLN
1000.0000 mg | INTRAVENOUS | Status: DC
  Filled 2018-03-04: qty 100

## 2018-03-04 MED ORDER — PHENOL 1.4 % MT LIQD: 1.0000 | OROMUCOSAL | Status: DC | PRN

## 2018-03-04 MED ORDER — ISOPROPYL ALCOHOL 70 % SOLN
Status: AC
  Filled 2018-03-04: qty 480

## 2018-03-04 MED ORDER — METHOCARBAMOL 500 MG IVPB - SIMPLE MED
500.0000 mg | Freq: Four times a day (QID) | INTRAVENOUS | Status: DC | PRN
  Filled 2018-03-04: qty 50

## 2018-03-04 MED ORDER — DIPHENHYDRAMINE HCL 12.5 MG/5ML PO ELIX: 12.5000 mg | ORAL_SOLUTION | ORAL | Status: DC | PRN

## 2018-03-04 MED ORDER — HYDROCODONE-ACETAMINOPHEN 5-325 MG PO TABS
1.0000 | ORAL_TABLET | ORAL | Status: DC | PRN
  Administered 2018-03-05: 1 via ORAL
  Filled 2018-03-04: qty 1

## 2018-03-04 MED ORDER — ALUM & MAG HYDROXIDE-SIMETH 200-200-20 MG/5ML PO SUSP: 30.0000 mL | ORAL | Status: DC | PRN

## 2018-03-04 MED ORDER — CEFAZOLIN SODIUM-DEXTROSE 2-4 GM/100ML-% IV SOLN
2.0000 g | Freq: Four times a day (QID) | INTRAVENOUS | Status: AC
  Administered 2018-03-04 – 2018-03-05 (×2): 2 g via INTRAVENOUS
  Filled 2018-03-04 (×2): qty 100

## 2018-03-04 MED ORDER — ROPIVACAINE HCL 5 MG/ML IJ SOLN
INTRAMUSCULAR | Status: DC | PRN
  Administered 2018-03-04: 25 mL via PERINEURAL

## 2018-03-04 MED ORDER — DOCUSATE SODIUM 100 MG PO CAPS
100.0000 mg | ORAL_CAPSULE | Freq: Two times a day (BID) | ORAL | Status: DC
  Administered 2018-03-04 – 2018-03-06 (×3): 100 mg via ORAL
  Filled 2018-03-04 (×4): qty 1

## 2018-03-04 MED ORDER — ONDANSETRON HCL 4 MG PO TABS: 4.0000 mg | ORAL_TABLET | Freq: Three times a day (TID) | ORAL | 0 refills | Status: DC | PRN

## 2018-03-04 MED ORDER — TRANEXAMIC ACID-NACL 1000-0.7 MG/100ML-% IV SOLN
1000.0000 mg | INTRAVENOUS | Status: AC
  Administered 2018-03-04: 1000 mg via INTRAVENOUS
  Filled 2018-03-04: qty 100

## 2018-03-04 MED ORDER — SODIUM CHLORIDE 0.9 % IR SOLN
Status: DC | PRN
  Administered 2018-03-04: 4000 mL

## 2018-03-04 MED ORDER — LACTATED RINGERS IV SOLN
INTRAVENOUS | Status: DC
  Administered 2018-03-04 (×2): via INTRAVENOUS

## 2018-03-04 MED ORDER — HYDROCODONE-ACETAMINOPHEN 5-325 MG PO TABS: 1.0000 | ORAL_TABLET | Freq: Four times a day (QID) | ORAL | 0 refills | Status: DC | PRN

## 2018-03-04 MED ORDER — BUPIVACAINE-EPINEPHRINE (PF) 0.25% -1:200000 IJ SOLN
INTRAMUSCULAR | Status: AC
  Filled 2018-03-04: qty 30

## 2018-03-04 MED ORDER — CEFAZOLIN SODIUM-DEXTROSE 2-4 GM/100ML-% IV SOLN
2.0000 g | INTRAVENOUS | Status: AC
  Administered 2018-03-04: 2 g via INTRAVENOUS
  Filled 2018-03-04: qty 100

## 2018-03-04 MED ORDER — SODIUM CHLORIDE 0.9 % IV SOLN: INTRAVENOUS | Status: DC

## 2018-03-04 MED ORDER — DEXAMETHASONE SODIUM PHOSPHATE 10 MG/ML IJ SOLN
INTRAMUSCULAR | Status: AC
  Filled 2018-03-04: qty 1

## 2018-03-04 MED ORDER — ATORVASTATIN CALCIUM 10 MG PO TABS
10.0000 mg | ORAL_TABLET | Freq: Every day | ORAL | Status: DC
  Administered 2018-03-05 – 2018-03-06 (×2): 10 mg via ORAL
  Filled 2018-03-04 (×3): qty 1

## 2018-03-04 MED ORDER — METOPROLOL TARTRATE 50 MG PO TABS
50.0000 mg | ORAL_TABLET | Freq: Two times a day (BID) | ORAL | Status: DC
  Administered 2018-03-04 – 2018-03-06 (×4): 50 mg via ORAL
  Filled 2018-03-04 (×4): qty 1

## 2018-03-04 MED ORDER — KETOROLAC TROMETHAMINE 30 MG/ML IJ SOLN
INTRAMUSCULAR | Status: AC
  Filled 2018-03-04: qty 1

## 2018-03-04 MED ORDER — ONDANSETRON HCL 4 MG/2ML IJ SOLN
INTRAMUSCULAR | Status: DC | PRN
  Administered 2018-03-04: 4 mg via INTRAVENOUS

## 2018-03-04 MED ORDER — PROPOFOL 500 MG/50ML IV EMUL
INTRAVENOUS | Status: DC | PRN
  Administered 2018-03-04: 100 ug/kg/min via INTRAVENOUS

## 2018-03-04 MED ORDER — PROPOFOL 10 MG/ML IV BOLUS
INTRAVENOUS | Status: AC
  Filled 2018-03-04: qty 40

## 2018-03-04 MED ORDER — BUPIVACAINE-EPINEPHRINE (PF) 0.25% -1:200000 IJ SOLN
INTRAMUSCULAR | Status: DC | PRN
  Administered 2018-03-04: 30 mL

## 2018-03-04 MED ORDER — ISOPROPYL ALCOHOL 70 % SOLN
Status: DC | PRN
  Administered 2018-03-04: 1 via TOPICAL

## 2018-03-04 MED ORDER — POLYETHYLENE GLYCOL 3350 17 G PO PACK
17.0000 g | PACK | Freq: Every day | ORAL | Status: DC | PRN
  Filled 2018-03-04: qty 1

## 2018-03-04 MED ORDER — KETOROLAC TROMETHAMINE 30 MG/ML IJ SOLN
INTRAMUSCULAR | Status: DC | PRN
  Administered 2018-03-04: 30 mg via INTRAMUSCULAR

## 2018-03-04 MED ORDER — BUPIVACAINE HCL (PF) 0.75 % IJ SOLN
INTRAMUSCULAR | Status: DC | PRN
  Administered 2018-03-04: 1.8 mL via INTRATHECAL

## 2018-03-04 MED ORDER — SODIUM CHLORIDE (PF) 0.9 % IJ SOLN
INTRAMUSCULAR | Status: AC
  Filled 2018-03-04: qty 50

## 2018-03-04 MED ORDER — METFORMIN HCL 500 MG PO TABS
1000.0000 mg | ORAL_TABLET | Freq: Two times a day (BID) | ORAL | Status: DC
  Administered 2018-03-05 – 2018-03-06 (×3): 1000 mg via ORAL
  Filled 2018-03-04 (×3): qty 2

## 2018-03-04 MED ORDER — HYDROCHLOROTHIAZIDE 25 MG PO TABS
25.0000 mg | ORAL_TABLET | Freq: Every day | ORAL | Status: DC
  Administered 2018-03-05: 25 mg via ORAL
  Filled 2018-03-04 (×2): qty 1

## 2018-03-04 MED ORDER — MIDAZOLAM HCL 2 MG/2ML IJ SOLN
INTRAMUSCULAR | Status: AC
  Filled 2018-03-04: qty 2

## 2018-03-04 MED ORDER — MIRABEGRON ER 25 MG PO TB24
25.0000 mg | ORAL_TABLET | Freq: Every day | ORAL | Status: DC
  Administered 2018-03-05: 25 mg via ORAL
  Filled 2018-03-04 (×2): qty 1

## 2018-03-04 MED ORDER — BUSPIRONE HCL 5 MG PO TABS: 2.5000 mg | ORAL_TABLET | Freq: Three times a day (TID) | ORAL | Status: DC

## 2018-03-04 MED ORDER — DEXAMETHASONE SODIUM PHOSPHATE 10 MG/ML IJ SOLN
INTRAMUSCULAR | Status: DC | PRN
  Administered 2018-03-04: 5 mg via INTRAVENOUS

## 2018-03-04 MED ORDER — PHENYLEPHRINE HCL 10 MG/ML IJ SOLN
INTRAMUSCULAR | Status: AC
  Filled 2018-03-04: qty 1

## 2018-03-04 MED ORDER — ACETAMINOPHEN 325 MG PO TABS: 325.0000 mg | ORAL_TABLET | Freq: Four times a day (QID) | ORAL | Status: DC | PRN

## 2018-03-04 MED ORDER — KETOROLAC TROMETHAMINE 15 MG/ML IJ SOLN
7.5000 mg | Freq: Four times a day (QID) | INTRAMUSCULAR | Status: AC
  Administered 2018-03-04 – 2018-03-05 (×4): 7.5 mg via INTRAVENOUS
  Filled 2018-03-04 (×4): qty 1

## 2018-03-04 MED ORDER — MIDAZOLAM HCL 2 MG/2ML IJ SOLN
1.0000 mg | INTRAMUSCULAR | Status: DC
  Filled 2018-03-04: qty 2

## 2018-03-04 MED ORDER — ASPIRIN 81 MG PO TABS: 81.0000 mg | ORAL_TABLET | Freq: Two times a day (BID) | ORAL | 1 refills | Status: DC

## 2018-03-04 MED ORDER — FENTANYL CITRATE (PF) 100 MCG/2ML IJ SOLN
50.0000 ug | INTRAMUSCULAR | Status: DC
  Administered 2018-03-04: 50 ug via INTRAVENOUS
  Filled 2018-03-04: qty 2

## 2018-03-04 MED ORDER — FENTANYL CITRATE (PF) 100 MCG/2ML IJ SOLN: 25.0000 ug | INTRAMUSCULAR | Status: DC | PRN

## 2018-03-04 MED ORDER — DEXAMETHASONE SODIUM PHOSPHATE 10 MG/ML IJ SOLN
10.0000 mg | Freq: Once | INTRAMUSCULAR | Status: AC
  Administered 2018-03-05: 10 mg via INTRAVENOUS
  Filled 2018-03-04: qty 1

## 2018-03-04 MED ORDER — MIDAZOLAM HCL 5 MG/5ML IJ SOLN
INTRAMUSCULAR | Status: DC | PRN
  Administered 2018-03-04 (×2): 1 mg via INTRAVENOUS

## 2018-03-04 MED ORDER — PROPOFOL 10 MG/ML IV BOLUS
INTRAVENOUS | Status: AC
  Filled 2018-03-04: qty 60

## 2018-03-04 MED ORDER — HYDROCODONE-ACETAMINOPHEN 7.5-325 MG PO TABS
1.0000 | ORAL_TABLET | ORAL | Status: DC | PRN
  Administered 2018-03-05 – 2018-03-06 (×4): 2 via ORAL
  Filled 2018-03-04 (×5): qty 2

## 2018-03-04 MED ORDER — MORPHINE SULFATE (PF) 2 MG/ML IV SOLN: 0.5000 mg | INTRAVENOUS | Status: DC | PRN

## 2018-03-04 MED ORDER — PROPOFOL 10 MG/ML IV BOLUS
INTRAVENOUS | Status: DC | PRN
  Administered 2018-03-04 (×2): 20 mg via INTRAVENOUS

## 2018-03-04 MED ORDER — DOCUSATE SODIUM 100 MG PO CAPS: 100.0000 mg | ORAL_CAPSULE | Freq: Two times a day (BID) | ORAL | 3 refills | Status: DC

## 2018-03-04 MED ORDER — POVIDONE-IODINE 10 % EX SWAB
2.0000 "application " | Freq: Once | CUTANEOUS | Status: AC
  Administered 2018-03-04: 2 via TOPICAL

## 2018-03-04 MED ORDER — CHLORHEXIDINE GLUCONATE 4 % EX LIQD
60.0000 mL | Freq: Once | CUTANEOUS | Status: AC
  Administered 2018-03-04: 4 via TOPICAL

## 2018-03-04 MED ORDER — SENNA 8.6 MG PO TABS: 2.0000 | ORAL_TABLET | Freq: Every day | ORAL | 3 refills | Status: DC

## 2018-03-04 MED ORDER — SODIUM CHLORIDE 0.9 % IV SOLN
INTRAVENOUS | Status: DC | PRN
  Administered 2018-03-04: 30 ug/min via INTRAVENOUS

## 2018-03-04 SURGICAL SUPPLY — 70 items
BAG ZIPLOCK 12X15 (MISCELLANEOUS) IMPLANT
BANDAGE ACE 4X5 VEL STRL LF (GAUZE/BANDAGES/DRESSINGS) ×2 IMPLANT
BANDAGE ACE 6X5 VEL STRL LF (GAUZE/BANDAGES/DRESSINGS) ×2 IMPLANT
BATTERY INSTRU NAVIGATION (MISCELLANEOUS) ×6 IMPLANT
BEARING TR TIB INST SZ7 11 KNE (Orthopedic Implant) ×1 IMPLANT
BLADE SAW RECIPROCATING 77.5 (BLADE) ×2 IMPLANT
CHLORAPREP W/TINT 26ML (MISCELLANEOUS) ×4 IMPLANT
COMPONENT TRI CR FEM SZ7 KNEE (Orthopedic Implant) ×1 IMPLANT
COVER SURGICAL LIGHT HANDLE (MISCELLANEOUS) ×2 IMPLANT
COVER WAND RF STERILE (DRAPES) ×2 IMPLANT
CUFF TOURN SGL QUICK 34 (TOURNIQUET CUFF) ×1
CUFF TRNQT CYL 34X4X40X1 (TOURNIQUET CUFF) ×1 IMPLANT
DECANTER SPIKE VIAL GLASS SM (MISCELLANEOUS) ×4 IMPLANT
DERMABOND ADVANCED (GAUZE/BANDAGES/DRESSINGS) ×2
DERMABOND ADVANCED .7 DNX12 (GAUZE/BANDAGES/DRESSINGS) ×2 IMPLANT
DRAPE SHEET LG 3/4 BI-LAMINATE (DRAPES) ×4 IMPLANT
DRAPE U-SHAPE 47X51 STRL (DRAPES) ×2 IMPLANT
DRESSING AQUACEL AG SP 3.5X10 (GAUZE/BANDAGES/DRESSINGS) ×1 IMPLANT
DRSG AQUACEL AG ADV 3.5X10 (GAUZE/BANDAGES/DRESSINGS) IMPLANT
DRSG AQUACEL AG SP 3.5X10 (GAUZE/BANDAGES/DRESSINGS) ×2
DRSG TEGADERM 4X4.75 (GAUZE/BANDAGES/DRESSINGS) IMPLANT
ELECT BLADE TIP CTD 4 INCH (ELECTRODE) ×2 IMPLANT
ELECT REM PT RETURN 15FT ADLT (MISCELLANEOUS) ×2 IMPLANT
EVACUATOR 1/8 PVC DRAIN (DRAIN) IMPLANT
GAUZE SPONGE 4X4 12PLY STRL (GAUZE/BANDAGES/DRESSINGS) ×2 IMPLANT
GLOVE BIO SURGEON STRL SZ8.5 (GLOVE) ×4 IMPLANT
GLOVE BIOGEL M 7.0 STRL (GLOVE) ×6 IMPLANT
GLOVE BIOGEL PI IND STRL 7.5 (GLOVE) ×1 IMPLANT
GLOVE BIOGEL PI IND STRL 8.5 (GLOVE) ×1 IMPLANT
GLOVE BIOGEL PI INDICATOR 7.5 (GLOVE) ×1
GLOVE BIOGEL PI INDICATOR 8.5 (GLOVE) ×1
GOWN SPEC L3 XXLG W/TWL (GOWN DISPOSABLE) ×2 IMPLANT
GOWN STRL REUS W/TWL XL LVL3 (GOWN DISPOSABLE) ×2 IMPLANT
HANDPIECE INTERPULSE COAX TIP (DISPOSABLE) ×1
HOLDER FOLEY CATH W/STRAP (MISCELLANEOUS) ×2 IMPLANT
HOOD PEEL AWAY FLYTE STAYCOOL (MISCELLANEOUS) ×10 IMPLANT
INSERT TIBIAL KNEE 9MM SIZE 7 (Insert) ×2 IMPLANT
KNEE TIBIAL COMPONENT SZ7 (Knees) ×2 IMPLANT
MARKER SKIN DUAL TIP RULER LAB (MISCELLANEOUS) ×2 IMPLANT
NEEDLE SPNL 18GX3.5 QUINCKE PK (NEEDLE) ×2 IMPLANT
NS IRRIG 1000ML POUR BTL (IV SOLUTION) IMPLANT
PACK TOTAL KNEE CUSTOM (KITS) ×2 IMPLANT
PADDING CAST COTTON 6X4 STRL (CAST SUPPLIES) ×2 IMPLANT
PATELLA ASYMMETRIC 38X11 KNEE (Orthopedic Implant) ×2 IMPLANT
PROTECTOR NERVE ULNAR (MISCELLANEOUS) ×2 IMPLANT
SAW OSC TIP CART 19.5X105X1.3 (SAW) ×2 IMPLANT
SEALER BIPOLAR AQUA 6.0 (INSTRUMENTS) ×2 IMPLANT
SET HNDPC FAN SPRY TIP SCT (DISPOSABLE) ×1 IMPLANT
SET PAD KNEE POSITIONER (MISCELLANEOUS) ×2 IMPLANT
SPONGE DRAIN TRACH 4X4 STRL 2S (GAUZE/BANDAGES/DRESSINGS) IMPLANT
SPONGE LAP 18X18 RF (DISPOSABLE) IMPLANT
SUT MNCRL AB 3-0 PS2 18 (SUTURE) ×2 IMPLANT
SUT MNCRL AB 4-0 PS2 18 (SUTURE) ×2 IMPLANT
SUT MON AB 2-0 CT1 36 (SUTURE) ×4 IMPLANT
SUT STRATAFIX PDO 1 14 VIOLET (SUTURE) ×1
SUT STRATFX PDO 1 14 VIOLET (SUTURE) ×1
SUT VIC AB 1 CTX 36 (SUTURE) ×2
SUT VIC AB 1 CTX36XBRD ANBCTR (SUTURE) ×2 IMPLANT
SUT VIC AB 2-0 CT1 27 (SUTURE) ×1
SUT VIC AB 2-0 CT1 TAPERPNT 27 (SUTURE) ×1 IMPLANT
SUTURE STRATFX PDO 1 14 VIOLET (SUTURE) ×1 IMPLANT
SYR 50ML LL SCALE MARK (SYRINGE) ×2 IMPLANT
TOWER CARTRIDGE SMART MIX (DISPOSABLE) IMPLANT
TRAY FOLEY MTR SLVR 16FR STAT (SET/KITS/TRAYS/PACK) ×2 IMPLANT
TRI CRUC RET FEM SZ7 KNEE (Orthopedic Implant) ×2 IMPLANT
TRI TIB BEAR INSERT SZ 7 KNEE (Orthopedic Implant) ×1 IMPLANT
TRI TIB BEAR INST SZ 7 11 KNEE (Orthopedic Implant) ×1 IMPLANT
WATER STERILE IRR 1000ML POUR (IV SOLUTION) ×4 IMPLANT
WRAP KNEE MAXI GEL POST OP (GAUZE/BANDAGES/DRESSINGS) ×2 IMPLANT
YANKAUER SUCT BULB TIP 10FT TU (MISCELLANEOUS) ×2 IMPLANT

## 2018-03-04 NOTE — Progress Notes (Signed)
AssistedDr. Ellender with right, ultrasound guided, adductor canal block. Side rails up, monitors on throughout procedure. See vital signs in flow sheet. Tolerated Procedure well.  

## 2018-03-04 NOTE — Anesthesia Procedure Notes (Signed)
Anesthesia Regional Block: Adductor canal block   Pre-Anesthetic Checklist: ,, timeout performed, Correct Patient, Correct Site, Correct Laterality, Correct Procedure,, site marked, risks and benefits discussed, Surgical consent,  Pre-op evaluation,  At surgeon's request and post-op pain management  Laterality: Right  Prep: chloraprep       Needles:  Injection technique: Single-shot  Needle Type: Echogenic Stimulator Needle     Needle Length: 9cm  Needle Gauge: 21     Additional Needles:   Procedures:,,,, ultrasound used (permanent image in chart),,,,  Narrative:  Start time: 03/04/2018 1:40 PM End time: 03/04/2018 1:50 PM Injection made incrementally with aspirations every 5 mL.  Performed by: Personally  Anesthesiologist: Murvin Natal, MD  Additional Notes: Functioning IV was confirmed and monitors were applied. A time-out was performed. Hand hygiene and sterile gloves were used. The thigh was placed in a frog-leg position and prepped in a sterile fashion. A 19mm 21ga Arrow echogenic stimulator needle was placed using ultrasound guidance.  Negative aspiration and negative test dose prior to incremental administration of local anesthetic. The patient tolerated the procedure well.

## 2018-03-04 NOTE — Anesthesia Procedure Notes (Signed)
Date/Time: 03/04/2018 2:15 PM Performed by: Claudia Desanctis, CRNA Pre-anesthesia Checklist: Patient identified, Emergency Drugs available, Suction available and Patient being monitored Oxygen Delivery Method: Simple face mask

## 2018-03-04 NOTE — Discharge Summary (Signed)
Physician Discharge Summary  Patient ID: [name] MRN: [id number] DOB/AGE: 08-Sep-1933 84 y.o.  Admit date: 03/04/2018 Discharge date: 03/06/2018  Admission Diagnoses:  Osteoarthritis of right knee  Discharge Diagnoses:  Principal Problem:   Osteoarthritis of right knee   Past Medical History:  Diagnosis Date  . Allergy   . CAD (coronary artery disease)    pci to LAD 10/09; stable CAD by cath 05/31/08; myoview 04/11/11- no ishcemia, EF 67%; echo 05/15/09- EF>55%, mod calcification of the aortic valve leaflets  . Chronic back pain   . ED (erectile dysfunction)   . Essential hypertension, benign   . Hyperlipidemia   . Multiple rib fractures    left 9th, 10th, and 11th posterior rib fractures S/P fall 12/09/2015/notes 12/12/2015  . Prostate cancer ([place])    s/p  . Type II or unspecified type diabetes mellitus without mention of complication, not stated as uncontrolled   . Ventricular tachycardia ([place])    resuscitated; monitor 05/2008; attempted T ablation 2/10- aborted due to inappropriate substrate    Surgeries: Procedure(s): COMPUTER ASSISTED TOTAL KNEE ARTHROPLASTY on 03/04/2018   Consultants (if any):   Discharged Condition: Improved  Hospital Course: CAYDENCE ENCK is an 84 y.o. male who was admitted 03/04/2018 with a diagnosis of Osteoarthritis of right knee and went to the operating room on 03/04/2018 and underwent the above named procedures.    He was given perioperative antibiotics:  Anti-infectives (From admission, onward)   Start     Dose/Rate Route Frequency Ordered Stop   03/04/18 2000  ceFAZolin (ANCEF) IVPB 2g/100 mL premix     2 g 200 mL/hr over 30 Minutes Intravenous Every 6 hours 03/04/18 1859 03/05/18 0236   03/04/18 1130  ceFAZolin (ANCEF) IVPB 2g/100 mL premix     2 g 200 mL/hr over 30 Minutes Intravenous On call to O.R. 03/04/18 1128 03/04/18 1432    .  He was given sequential compression devices, early ambulation, and ASA for DVT  prophylaxis.  He benefited maximally from the hospital stay and there were no complications.    Recent vital signs:  Vitals:   03/05/18 2214 03/06/18 0624  BP: (!) 167/73 (!) 138/47  Pulse: 79 (!) 56  Resp: 18 15  Temp: (!) 97.5 F (36.4 C) 98.2 F (36.8 C)  SpO2: 100% 100%    Recent laboratory studies:  Lab Results  Component Value Date   HGB 9.7 (L) 03/06/2018   HGB 10.6 (L) 03/05/2018   HGB 11.8 (L) 02/25/2018   Lab Results  Component Value Date   WBC 9.6 03/06/2018   PLT 188 03/06/2018   Lab Results  Component Value Date   INR 0.94 07/13/2009   Lab Results  Component Value Date   NA 135 03/05/2018   K 4.8 03/05/2018   CL 109 03/05/2018   CO2 20 (L) 03/05/2018   BUN 22 03/05/2018   CREATININE 1.77 (H) 03/05/2018   GLUCOSE 149 (H) 03/05/2018    Discharge Medications:   Allergies as of 03/06/2018      Reactions   Contrast Media [iodinated Diagnostic Agents] Swelling, Other (See Comments)   Possible neck swelling after contrast for chest CT 03/2016. No hives, throat or other symptoms      Medication List    TAKE these medications   ACCU-CHEK AVIVA PLUS w/Device Kit Test blood sugar 3 times daily. Dx code: E11.22   accu-chek multiclix lancets Test blood sugar 3 times daily. Dx code: E11.22   amLODipine 5 MG tablet  Commonly known as:  NORVASC Take 1.5 tablets (7.5 mg total) by mouth daily.   aspirin 81 MG tablet Take 1 tablet (81 mg total) by mouth 2 (two) times daily after a meal.   atorvastatin 10 MG tablet Commonly known as:  LIPITOR Take 1 tablet (10 mg total) by mouth daily.   azelastine 0.1 % nasal spray Commonly known as:  ASTELIN Place 2 sprays into both nostrils 2 (two) times daily. Use in each nostril as directed   ciprofloxacin 0.3 % ophthalmic solution Commonly known as:  CILOXAN Place 1 drop into both eyes every 4 (four) hours while awake. Notes to patient:  Home regimen   clotrimazole-betamethasone cream Commonly known as:   LOTRISONE Apply 1 application topically 2 (two) times daily.   docusate sodium 100 MG capsule Commonly known as:  COLACE Take 1 capsule (100 mg total) by mouth 2 (two) times daily.   ferrous sulfate 325 (65 FE) MG tablet Take 325 mg by mouth daily with breakfast.   Fish Oil 1000 MG Caps Take 2 capsules by mouth daily.   glucose blood test strip Test blood sugar 3 times daily. Dx code: E11.22   HYDROcodone-acetaminophen 5-325 MG tablet Commonly known as:  NORCO/VICODIN Take 1-2 tablets by mouth every 6 (six) hours as needed for moderate pain.   insulin NPH-regular Human (70-30) 100 UNIT/ML injection ADMINISTER 8 UNITS UNDER THE SKIN TWICE DAILY WITH A MEAL What changed:    how much to take  how to take this  when to take this  additional instructions   Insulin Syringe-Needle U-100 31G X 5/16" 0.3 ML Misc Use daily at bedtime to inject insulin. Dx code: 250.00   lisinopril-hydrochlorothiazide 20-25 MG tablet Commonly known as:  PRINZIDE,ZESTORETIC Take 1 tablet by mouth daily.   metFORMIN 1000 MG tablet Commonly known as:  GLUCOPHAGE Take 1 tablet (1,000 mg total) by mouth 2 (two) times daily with a meal.   metoprolol tartrate 50 MG tablet Commonly known as:  LOPRESSOR Take 1 tablet (50 mg total) by mouth 2 (two) times daily.   multivitamin tablet Take 1 tablet by mouth daily. COMPLETE SENIOR VITAMINS AND M   MYRBETRIQ 25 MG Tb24 tablet Generic drug:  mirabegron ER Take 25 mg by mouth daily.   ondansetron 4 MG tablet Commonly known as:  ZOFRAN Take 1 tablet (4 mg total) by mouth every 8 (eight) hours as needed for nausea or vomiting.   senna 8.6 MG Tabs tablet Commonly known as:  SENOKOT Take 2 tablets (17.2 mg total) by mouth at bedtime.   Vitamin D (Cholecalciferol) 25 MCG (1000 UT) Tabs Take 1 tablet by mouth daily.       Diagnostic Studies: Dg Chest 2 View  Result Date: 03/10/2018 CLINICAL DATA:  Intractable headache ups. EXAM: CHEST - 2 VIEW  COMPARISON:  01/12/2018 FINDINGS: Heart size upper limits of normal. Aortic atherosclerosis is noted. The lungs are clear. Pulmonary vascularity is normal. There is chronic pleural blunting on the left consistent with chronic pleural scarring. Ordinary degenerative changes affect the spine. IMPRESSION: No active cardiopulmonary disease. 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 Aortic atherosclerosis. Electronically Signed   By: Nelson Chimes M.D.   On: 03/10/2018 11:53   Dg Knee Right Port  Result Date: 03/04/2018 CLINICAL DATA:  Post total knee replacement EXAM: PORTABLE RIGHT KNEE - 1-2 VIEW COMPARISON:  Portable exam 1734 hours compared to preoperative images of 05/17/2015 FINDINGS: Osseous demineralization. Components of a of RIGHT knee prosthesis are identified. Mildly  displaced acute fracture of the fibular head is identified. No additional fracture, dislocation or bone destruction. No periprosthetic lucency. Soft tissue changes anteriorly consistent with surgery. IMPRESSION: RIGHT knee prosthesis. Acute fracture of RIGHT fibular head. Osseous demineralization. Electronically Signed   By: Lavonia Dana M.D.   On: 03/04/2018 18:16    Disposition:     Follow-up Information    Sedonia Kitner, Aaron Edelman, MD. Schedule an appointment as soon as possible for a visit in 2 weeks.   Specialty:  Orthopedic Surgery Why:  For wound re-check Contact information: [address] [place] [address] [phone]            Signed: Hilton Cork Weston Kallman 03/10/2018, 4:53 PM

## 2018-03-04 NOTE — Discharge Instructions (Signed)
°Dr. Azari Janssens °Joint Replacement Specialist °[facility] °[address] °[place], [place] [address] °[phone] ° ° °TOTAL HIP REPLACEMENT POSTOPERATIVE DIRECTIONS ° ° ° °Hip Rehabilitation, Guidelines Following Surgery  ° °WEIGHT BEARING °Weight bearing as tolerated with assist device (walker, cane, etc) as directed, use it as long as suggested by your surgeon or therapist, typically at least 4-6 weeks. ° °The results of a hip operation are greatly improved after range of motion and muscle strengthening exercises. Follow all safety measures which are given to protect your hip. If any of these exercises cause increased pain or swelling in your joint, decrease the amount until you are comfortable again. Then slowly increase the exercises. Call your caregiver if you have problems or questions.  ° °HOME CARE INSTRUCTIONS  °Most of the following instructions are designed to prevent the dislocation of your new hip.  °Remove items at home which could result in a fall. This includes throw rugs or furniture in walking pathways.  °Continue medications as instructed at time of discharge. °· You may have some home medications which will be placed on hold until you complete the course of blood thinner medication. °· You may start showering once you are discharged home. Do not remove your dressing. °Do not put on socks or shoes without following the instructions of your caregivers.   °Sit on chairs with arms. Use the chair arms to help push yourself up when arising.  °Arrange for the use of a toilet seat elevator so you are not sitting low.  °· Walk with walker as instructed.  °You may resume a sexual relationship in one month or when given the OK by your caregiver.  °Use walker as long as suggested by your caregivers.  °You may put full weight on your legs and walk as much as is comfortable. °Avoid periods of inactivity such as sitting longer than an hour when not asleep. This helps prevent  blood clots.  °You may return to work once you are cleared by your surgeon.  °Do not drive a car for 6 weeks or until released by your surgeon.  °Do not drive while taking narcotics.  °Wear elastic stockings for two weeks following surgery during the day but you may remove then at night.  °Make sure you keep all of your appointments after your operation with all of your doctors and caregivers. You should call the office at the above phone number and make an appointment for approximately two weeks after the date of your surgery. °Please pick up a stool softener and laxative for home use as long as you are requiring pain medications. °· ICE to the affected hip every three hours for 30 minutes at a time and then as needed for pain and swelling. Continue to use ice on the hip for pain and swelling from surgery. You may notice swelling that will progress down to the foot and ankle.  This is normal after surgery.  Elevate the leg when you are not up walking on it.   °It is important for you to complete the blood thinner medication as prescribed by your doctor. °· Continue to use the breathing machine which will help keep your temperature down.  It is common for your temperature to cycle up and down following surgery, especially at night when you are not up moving around and exerting yourself.  The breathing machine keeps your lungs expanded and your temperature down. ° °RANGE OF MOTION AND STRENGTHENING EXERCISES  °These exercises are   designed to help you keep full movement of your hip joint. Follow your caregiver's or physical therapist's instructions. Perform all exercises about fifteen times, three times per day or as directed. Exercise both hips, even if you have had only one joint replacement. These exercises can be done on a training (exercise) mat, on the floor, on a table or on a bed. Use whatever works the best and is most comfortable for you. Use music or television while you are exercising so that the exercises  are a pleasant break in your day. This will make your life better with the exercises acting as a break in routine you can look forward to.  °Lying on your back, slowly slide your foot toward your buttocks, raising your knee up off the floor. Then slowly slide your foot back down until your leg is straight again.  °Lying on your back spread your legs as far apart as you can without causing discomfort.  °Lying on your side, raise your upper leg and foot straight up from the floor as far as is comfortable. Slowly lower the leg and repeat.  °Lying on your back, tighten up the muscle in the front of your thigh (quadriceps muscles). You can do this by keeping your leg straight and trying to raise your heel off the floor. This helps strengthen the largest muscle supporting your knee.  °Lying on your back, tighten up the muscles of your buttocks both with the legs straight and with the knee bent at a comfortable angle while keeping your heel on the floor.  ° °SKILLED REHAB INSTRUCTIONS: °If the patient is transferred to a skilled rehab facility following release from the hospital, a list of the current medications will be sent to the facility for the patient to continue.  When discharged from the skilled rehab facility, please have the facility set up the patient's Home Health Physical Therapy prior to being released. Also, the skilled facility will be responsible for providing the patient with their medications at time of release from the facility to include their pain medication and their blood thinner medication. If the patient is still at the rehab facility at time of the two week follow up appointment, the skilled rehab facility will also need to assist the patient in arranging follow up appointment in our office and any transportation needs. ° °MAKE SURE YOU:  °Understand these instructions.  °Will watch your condition.  °Will get help right away if you are not doing well or get worse. ° °Pick up stool softner and  laxative for home use following surgery while on pain medications. °Do not remove your dressing. °The dressing is waterproof--it is OK to take showers. °Continue to use ice for pain and swelling after surgery. °Do not use any lotions or creams on the incision until instructed by your surgeon. °Total Hip Protocol. ° ° °

## 2018-03-04 NOTE — Op Note (Signed)
OPERATIVE REPORT  SURGEON: Rod Can, MD   ASSISTANT: Nehemiah Massed, PA-C.  PREOPERATIVE DIAGNOSIS: Right knee arthritis.   POSTOPERATIVE DIAGNOSIS: Right knee arthritis.   PROCEDURE: Right total knee arthroplasty.   IMPLANTS: Stryker Triathlon CR femur, size 7. Stryker Tritanium tibia, size 7. X3 polyethelyene insert, size 11 mm, CR. 3 button asymmetric patella, size 38 mm.  ANESTHESIA:  MAC, Regional and Spinal  TOURNIQUET TIME: Not utilized.   ESTIMATED BLOOD LOSS:-250 mL    ANTIBIOTICS: 2 g Ancef.  DRAINS: None.  COMPLICATIONS: None   CONDITION: PACU - hemodynamically stable.   BRIEF CLINICAL NOTE: [name] is a 84 y.o. male with a long-standing history of Right knee arthritis. After failing conservative management, the patient was indicated for total knee arthroplasty. The risks, benefits, and alternatives to the procedure were explained, and the patient elected to proceed.  PROCEDURE IN DETAIL: Adductor canal block was obtained in the pre-op holding area. Once inside the operative room, spinal anesthesia was obtained, and a foley catheter was inserted. The patient was then positioned, a nonsterile tourniquet was placed, and the lower extremity was prepped and draped in the normal sterile surgical fashion. A time-out was called verifying side and site of surgery. The patient received IV antibiotics within 60 minutes of beginning the procedure. The tourniquet was not utilized.  An anterior approach to the knee was performed utilizing a midvastus arthrotomy. A medial release was performed and the patellar fat pad was excised. Stryker navigation was used to cut the distal femur perpendicular to the mechanical axis. A freehand patellar resection was performed, and the patella was sized an prepared with 3 lug holes.  Nagivation was used to make a neutral proximal  tibia resection, taking 9 mm of bone from the less affected lateral side with 3 degrees of slope. The menisci were excised. A spacer block was placed, and the alignment and balance in extension were confirmed.   The distal femur was sized using the 3-degree external rotation guide referencing the posterior femoral cortex. The appropriate 4-in-1 cutting block was pinned into place. Rotation was checked using Whiteside's line, the epicondylar axis, and then confirmed with a spacer block in flexion. The remaining femoral cuts were performed, taking care to protect the MCL.  The tibia was sized and the trial tray was pinned into place. The remaining trail components were inserted. The knee was stable to varus and valgus stress through a full range of motion. The patella tracked centrally, and the PCL was well balanced. The trial components were removed, and the proximal tibial surface was prepared. Final components were impacted into place. The knee was tested for a final time and found to be well balanced.  The wound was copiously irrigated with normal saline with pulse lavage. Marcaine solution was injected into the periarticular soft tissue. The wound was closed in layers using #1 Vicryl and Stratafix for the fascia, 2-0 Vicryl for the subcutaneous fat, 2-0 Monocryl for the deep dermal layer, 3-0 running Monocryl subcuticular Stitch, and Dermabond for the skin. Once the glue was fully dried, an Aquacell Ag and compressive dressing were applied. Tthe patient was transported to the recovery room in stable condition. Sponge, needle, and instrument counts were correct at the end of the case x2. The patient tolerated the procedure well and there were no known complications.  Please note that a surgical assistant was a medical necessity for this procedure in order to perform it in a safe and expeditious manner. Surgical assistant was  necessary to retract the ligaments and vital neurovascular structures to  prevent injury to them and also necessary for proper positioning of the limb to allow for anatomic placement of the prosthesis.

## 2018-03-04 NOTE — Interval H&P Note (Signed)
History and Physical Interval Note:  03/04/2018 12:26 PM  [name]  has presented today for surgery, with the diagnosis of Degenerative joint disease right knee  The various methods of treatment have been discussed with the patient and family. After consideration of risks, benefits and other options for treatment, the patient has consented to  Procedure(s): COMPUTER ASSISTED TOTAL KNEE ARTHROPLASTY (Right) as a surgical intervention .  The patient's history has been reviewed, patient examined, no change in status, stable for surgery.  I have reviewed the patient's chart and labs.  Questions were answered to the patient's satisfaction.     Hilton Cork Zeke Aker

## 2018-03-04 NOTE — Transfer of Care (Signed)
Immediate Anesthesia Transfer of Care Note  Patient: [name]  Procedure(s) Performed: COMPUTER ASSISTED TOTAL KNEE ARTHROPLASTY (Right )  Patient Location: PACU  Anesthesia Type:Spinal  Level of Consciousness: awake, alert , oriented and patient cooperative  Airway & Oxygen Therapy: Patient Spontanous Breathing and Patient connected to nasal cannula oxygen  Post-op Assessment: Report given to RN and Post -op Vital signs reviewed and stable  Post vital signs: Reviewed and stable  Last Vitals:  Vitals Value Taken Time  BP    Temp    Pulse 73 03/04/2018  5:22 PM  Resp 17 03/04/2018  5:22 PM  SpO2 93 % 03/04/2018  5:22 PM  Vitals shown include unvalidated device data.  Last Pain:  Vitals:   03/04/18 1207  TempSrc:   PainSc: 0-No pain         Complications: No apparent anesthesia complications

## 2018-03-04 NOTE — Plan of Care (Signed)

## 2018-03-04 NOTE — Anesthesia Procedure Notes (Signed)
Spinal  Patient location during procedure: OR Start time: 03/04/2018 2:00 PM End time: 03/04/2018 2:10 PM Staffing Anesthesiologist: Murvin Natal, MD Performed: anesthesiologist  Preanesthetic Checklist Completed: patient identified, surgical consent, pre-op evaluation, timeout performed, IV checked, risks and benefits discussed and monitors and equipment checked Spinal Block Patient position: sitting Prep: DuraPrep Patient monitoring: cardiac monitor, continuous pulse ox and blood pressure Approach: midline Location: L4-5 Injection technique: single-shot Needle Needle type: Pencan  Needle gauge: 24 G Needle length: 9 cm Assessment Sensory level: T10 Additional Notes Functioning IV was confirmed and monitors were applied. Sterile prep and drape, including hand hygiene and sterile gloves were used. The patient was positioned and the spine was prepped. The skin was anesthetized with lidocaine.  Free flow of clear CSF was obtained prior to injecting local anesthetic into the CSF.  The spinal needle aspirated freely following injection.  The needle was carefully withdrawn.  The patient tolerated the procedure well.

## 2018-03-04 NOTE — Anesthesia Preprocedure Evaluation (Addendum)
Anesthesia Evaluation  Patient identified by MRN, date of birth, ID band Patient awake    Reviewed: Allergy & Precautions, NPO status , Patient's Chart, lab work & pertinent test results, reviewed documented beta blocker date and time   Airway Mallampati: II  TM Distance: >3 FB Neck ROM: Full    Dental  (+) Edentulous Upper, Edentulous Lower   Pulmonary former smoker,    Pulmonary exam normal breath sounds clear to auscultation       Cardiovascular hypertension, Pt. on medications and Pt. on home beta blockers + CAD and + Cardiac Stents (x1 in 2009)  Normal cardiovascular exam Rhythm:Regular Rate:Normal  ECG: NSR, rate 66  Cardiac clearance Dr Quay Burow on chart   Medical clearance Dr [name] on chart  LOV cardio 02-03-18 epic    Neuro/Psych negative neurological ROS  negative psych ROS   GI/Hepatic negative GI ROS, Neg liver ROS,   Endo/Other  diabetes, Oral Hypoglycemic Agents  Renal/GU Renal disease     Musculoskeletal negative musculoskeletal ROS (+)   Abdominal (+) + obese,   Peds  Hematology  (+) anemia , HLD   Anesthesia Other Findings Degenerative joint disease right knee  Reproductive/Obstetrics                           Anesthesia Physical Anesthesia Plan  ASA: III  Anesthesia Plan: Regional and Spinal   Post-op Pain Management:  Regional for Post-op pain   Induction:   PONV Risk Score and Plan: 2 and Propofol infusion and Treatment may vary due to age or medical condition  Airway Management Planned: Natural Airway  Additional Equipment:   Intra-op Plan:   Post-operative Plan:   Informed Consent: I have reviewed the patients History and Physical, chart, labs and discussed the procedure including the risks, benefits and alternatives for the proposed anesthesia with the patient or authorized representative who has indicated his/her understanding and  acceptance.   Dental advisory given  Plan Discussed with: CRNA  Anesthesia Plan Comments:         Anesthesia Quick Evaluation

## 2018-03-05 LAB — CBC
HCT: 34.7 % — ABNORMAL LOW (ref 39.0–52.0)
Hemoglobin: 10.6 g/dL — ABNORMAL LOW (ref 13.0–17.0)
MCH: 25.6 pg — ABNORMAL LOW (ref 26.0–34.0)
MCHC: 30.5 g/dL (ref 30.0–36.0)
MCV: 83.8 fL (ref 80.0–100.0)
Platelets: 210 10*3/uL (ref 150–400)
RBC: 4.14 MIL/uL — ABNORMAL LOW (ref 4.22–5.81)
RDW: 14.7 % (ref 11.5–15.5)
WBC: 9.5 10*3/uL (ref 4.0–10.5)
nRBC: 0 % (ref 0.0–0.2)

## 2018-03-05 LAB — BASIC METABOLIC PANEL
Anion gap: 6 (ref 5–15)
BUN: 22 mg/dL (ref 8–23)
CO2: 20 mmol/L — ABNORMAL LOW (ref 22–32)
Calcium: 8.2 mg/dL — ABNORMAL LOW (ref 8.9–10.3)
Chloride: 109 mmol/L (ref 98–111)
Creatinine, Ser: 1.77 mg/dL — ABNORMAL HIGH (ref 0.61–1.24)
GFR calc Af Amer: 41 mL/min — ABNORMAL LOW (ref >60)
GFR calc non Af Amer: 35 mL/min — ABNORMAL LOW (ref >60)
Glucose, Bld: 149 mg/dL — ABNORMAL HIGH (ref 70–99)
Potassium: 4.8 mmol/L (ref 3.5–5.1)
Sodium: 135 mmol/L (ref 135–145)

## 2018-03-05 LAB — GLUCOSE, CAPILLARY
Glucose-Capillary: 135 mg/dL — ABNORMAL HIGH (ref 70–99)
Glucose-Capillary: 146 mg/dL — ABNORMAL HIGH (ref 70–99)
Glucose-Capillary: 196 mg/dL — ABNORMAL HIGH (ref 70–99)
Glucose-Capillary: 161 mg/dL — ABNORMAL HIGH (ref 70–99)

## 2018-03-05 MED ORDER — ZOLPIDEM TARTRATE 5 MG PO TABS: 5.0000 mg | ORAL_TABLET | Freq: Every evening | ORAL | Status: DC | PRN

## 2018-03-05 NOTE — Evaluation (Signed)
Physical Therapy Evaluation Patient Details Name: [name] MRN: [id number] DOB: 04/01/1934 Today's Date: 03/05/2018   History of Present Illness  Pt s/p R TKR and with hx of DM, CAD, and chronic back pain  Clinical Impression  Pt s/p R TKR and presents with decreased R LE strength/ROM and post op pain limiting functional mobility. Pt should progress to dc home with family assist.    Follow Up Recommendations Follow surgeon's recommendation for DC plan and follow-up therapies    Equipment Recommendations  Rolling walker with 5" wheels    Recommendations for Other Services       Precautions / Restrictions Precautions Precautions: Fall Restrictions Weight Bearing Restrictions: No Other Position/Activity Restrictions: WBAT      Mobility  Bed Mobility Overal bed mobility: Needs Assistance Bed Mobility: Supine to Sit     Supine to sit: Min assist     General bed mobility comments: cues for sequence and use of L LE to self assist  Transfers Overall transfer level: Needs assistance Equipment used: Rolling walker (2 wheeled) Transfers: Sit to/from Stand Sit to Stand: Min assist;Min guard         General transfer comment: cues for LE management and use of UEs to self assist  Ambulation/Gait Ambulation/Gait assistance: Min assist Gait Distance (Feet): 80 Feet Assistive device: Rolling walker (2 wheeled) Gait Pattern/deviations: Step-to pattern;Decreased step length - right;Decreased step length - left;Shuffle;Trunk flexed Gait velocity: decr   General Gait Details: cues for posture, sequence and position from [employer] Mobility    Modified Rankin (Stroke Patients Only)       Balance Overall balance assessment: Mild deficits observed, not formally tested                                           Pertinent Vitals/Pain Pain Assessment: 0-10 Pain Score: 4  Pain Location: R knee Pain Descriptors /  Indicators: Aching;Sore Pain Intervention(s): Limited activity within patient's tolerance;Monitored during session;Premedicated before session;Ice applied    Home Living Family/patient expects to be discharged to:: Private residence Living Arrangements: Spouse/significant other Available Help at Discharge: Family Type of Home: House Home Access: Stairs to enter Entrance Stairs-Rails: Right;Left;Can reach both Entrance Stairs-Number of Steps: 4 Home Layout: Able to live on main level with bedroom/bathroom Home Equipment: Walker - 2 wheels;Shower seat      Prior Function Level of Independence: Independent               Hand Dominance        Extremity/Trunk Assessment   Upper Extremity Assessment Upper Extremity Assessment: Overall WFL for tasks assessed    Lower Extremity Assessment Lower Extremity Assessment: RLE deficits/detail RLE Deficits / Details: 3/5 quad strength with IND SLR;  AAROM at knee -10- 45       Communication   Communication: No difficulties  Cognition Arousal/Alertness: Awake/alert Behavior During Therapy: WFL for tasks assessed/performed Overall Cognitive Status: Within Functional Limits for tasks assessed                                        General Comments      Exercises Total Joint Exercises Ankle Circles/Pumps: AROM;Both;15 reps;Supine Quad Sets: AROM;Both;10 reps;Supine Heel Slides: AAROM;Right;15 reps;Supine  Straight Leg Raises: AAROM;AROM;Right;15 reps;Supine   Assessment/Plan    PT Assessment Patient needs continued PT services  PT Problem List Decreased strength;Decreased range of motion;Decreased activity tolerance;Decreased mobility;Decreased knowledge of use of DME;Pain       PT Treatment Interventions DME instruction;Gait training;Stair training;Functional mobility training;Therapeutic activities;Therapeutic exercise;Patient/family education    PT Goals (Current goals can be found in the Care Plan  section)  Acute Rehab PT Goals Patient Stated Goal: Regain IND PT Goal Formulation: With patient Time For Goal Achievement: 03/12/18 Potential to Achieve Goals: Good    Frequency 7X/week   Barriers to discharge        Co-evaluation               AM-PAC PT "6 Clicks" Mobility  Outcome Measure Help needed turning from your back to your side while in a flat bed without using bedrails?: A Little Help needed moving from lying on your back to sitting on the side of a flat bed without using bedrails?: A Little Help needed moving to and from a bed to a chair (including a wheelchair)?: A Little Help needed standing up from a chair using your arms (e.g., wheelchair or bedside chair)?: A Little Help needed to walk in hospital room?: A Little Help needed climbing 3-5 steps with a railing? : A Little 6 Click Score: 18    End of Session Equipment Utilized During Treatment: Gait belt Activity Tolerance: Patient tolerated treatment well Patient left: in chair;with call bell/phone within reach;with chair alarm set Nurse Communication: Mobility status PT Visit Diagnosis: Difficulty in walking, not elsewhere classified (R26.2)    Time: 1010-1040 PT Time Calculation (min) (ACUTE ONLY): 30 min   Charges:   PT Evaluation $PT Eval Low Complexity: 1 Low PT Treatments $Therapeutic Exercise: 8-22 mins        Debe Coder PT Acute Rehabilitation Services Pager [phone] Office [phone]   Alexyss Balzarini 03/05/2018, 12:48 PM

## 2018-03-05 NOTE — Plan of Care (Signed)

## 2018-03-05 NOTE — Progress Notes (Signed)
Physical Therapy Treatment Patient Details Name: [name] MRN: [id number] DOB: [birth year] Today's Date: 03/05/2018    History of Present Illness Pt s/p R TKR and with hx of DM, CAD, and chronic back pain    PT Comments    Pt continues motivated and progressing well with mobility.  This session, pt ambulated increased distance and progressed to reciprocal gait with RW.  Therex deferred to later on arrival of pt lunch.   Follow Up Recommendations  Follow surgeon's recommendation for DC plan and follow-up therapies     Equipment Recommendations  None recommended by PT    Recommendations for Other Services       Precautions / Restrictions Precautions Precautions: Fall Restrictions Weight Bearing Restrictions: No Other Position/Activity Restrictions: WBAT    Mobility  Bed Mobility Overal bed mobility: Needs Assistance Bed Mobility: Supine to Sit     Supine to sit: Min assist     General bed mobility comments: Pt up in chair and requests back to same  Transfers Overall transfer level: Needs assistance Equipment used: Rolling walker (2 wheeled) Transfers: Sit to/from Stand Sit to Stand: Min assist;Min guard         General transfer comment: cues for LE management and use of UEs to self assist  Ambulation/Gait Ambulation/Gait assistance: Min assist;Min guard Gait Distance (Feet): 140 Feet Assistive device: Rolling walker (2 wheeled) Gait Pattern/deviations: Step-to pattern;Step-through pattern;Decreased step length - right;Decreased step length - left;Shuffle;Trunk flexed Gait velocity: decr   General Gait Details: cues for posture, sequence and position from [employer] Mobility    Modified Rankin (Stroke Patients Only)       Balance Overall balance assessment: Mild deficits observed, not formally tested                                          Cognition Arousal/Alertness: Awake/alert Behavior  During Therapy: WFL for tasks assessed/performed Overall Cognitive Status: Within Functional Limits for tasks assessed                                        Exercises Total Joint Exercises Ankle Circles/Pumps: AROM;Both;15 reps;Supine Quad Sets: AROM;Both;10 reps;Supine Heel Slides: AAROM;Right;15 reps;Supine Straight Leg Raises: AAROM;AROM;Right;15 reps;Supine    General Comments        Pertinent Vitals/Pain Pain Assessment: 0-10 Pain Score: 3  Pain Location: R knee Pain Descriptors / Indicators: Aching;Sore Pain Intervention(s): Monitored during session;Limited activity within patient's tolerance;Premedicated before session;Ice applied    Home Living Family/patient expects to be discharged to:: Private residence Living Arrangements: Spouse/significant other Available Help at Discharge: Family Type of Home: House Home Access: Stairs to enter Entrance Stairs-Rails: Right;Left;Can reach both Home Layout: Able to live on main level with bedroom/bathroom Home Equipment: Walker - 2 wheels;Shower seat      Prior Function Level of Independence: Independent          PT Goals (current goals can now be found in the care plan section) Acute Rehab PT Goals Patient Stated Goal: Regain IND PT Goal Formulation: With patient Time For Goal Achievement: 03/12/18 Potential to Achieve Goals: Good Progress towards PT goals: Progressing toward goals    Frequency    7X/week  PT Plan Current plan remains appropriate    Co-evaluation              AM-PAC PT "6 Clicks" Mobility   Outcome Measure  Help needed turning from your back to your side while in a flat bed without using bedrails?: A Little Help needed moving from lying on your back to sitting on the side of a flat bed without using bedrails?: A Little Help needed moving to and from a bed to a chair (including a wheelchair)?: A Little Help needed standing up from a chair using your arms (e.g.,  wheelchair or bedside chair)?: A Little Help needed to walk in hospital room?: A Little Help needed climbing 3-5 steps with a railing? : A Little 6 Click Score: 18    End of Session Equipment Utilized During Treatment: Gait belt Activity Tolerance: Patient tolerated treatment well Patient left: in chair;with call bell/phone within reach;with chair alarm set Nurse Communication: Mobility status PT Visit Diagnosis: Difficulty in walking, not elsewhere classified (R26.2)     Time: 6644-0347 PT Time Calculation (min) (ACUTE ONLY): 16 min  Charges:  $Gait Training: 8-22 mins $Therapeutic Exercise: 8-22 mins                     [place] Pager [phone] Office [phone]    Ivry Pigue 03/05/2018, 3:14 PM

## 2018-03-05 NOTE — Progress Notes (Signed)
Subjective: 1 Day Post-Op Procedure(s) (LRB): COMPUTER ASSISTED TOTAL KNEE ARTHROPLASTY (Right) Patient reports pain as well controlled.  Tolerating Po's and positive flatus. Reports a good night. Denies CP or SOB.  Objective: Vital signs in last 24 hours: Temp:  [97.4 F (36.3 C)-98.4 F (36.9 C)] 97.4 F (36.3 C) (11/28 0547) Pulse Rate:  [54-108] 59 (11/28 0547) Resp:  [12-23] 16 (11/28 0547) BP: (125-173)/(64-91) 173/91 (11/28 0547) SpO2:  [96 %-100 %] 99 % (11/28 0547) Weight:  [104.3 kg] 104.3 kg (11/27 1207)  Intake/Output from previous day: 11/27 0701 - 11/28 0700 In: 4986 [P.O.:240; I.V.:4546; IV Piggyback:200] Out: 1045 [Urine:795; Blood:250] Intake/Output this shift: No intake/output data recorded.  Recent Labs    03/05/18 0530  HGB 10.6*   Recent Labs    03/05/18 0530  WBC 9.5  RBC 4.14*  HCT 34.7*  PLT 210   Recent Labs    03/05/18 0530  NA 135  K 4.8  CL 109  CO2 20*  BUN 22  CREATININE 1.77*  GLUCOSE 149*  CALCIUM 8.2*   No results for input(s): LABPT, INR in the last 72 hours.  Well nourished. Alert and oriented x3. RRR, Lungs clear, BS x4. Abdomen soft and non tender. Right Calf soft and non tender. Right knee dressing C/D/I. No DVT signs. Compartment soft. No signs of infection.  Right LE grossly neurovascular intact.  Anticipated LOS equal to or greater than 2 midnights due to - Age 82 and older with one or more of the following:  - Obesity  - Expected need for hospital services (PT, OT, Nursing) required for safe  discharge  - Anticipated need for postoperative skilled nursing care or inpatient rehab  - Active co-morbidities: None OR   - Unanticipated findings during/Post Surgery: None  - Patient is a high risk of re-admission due to: None   Assessment/Plan: 1 Day Post-Op Procedure(s) (LRB): COMPUTER ASSISTED TOTAL KNEE ARTHROPLASTY (Right) Advance diet Up with therapy D/C IV fluids Plan for discharge tomorrow    [name] 03/05/2018, 8:57 AM

## 2018-03-05 NOTE — Progress Notes (Signed)
Physical Therapy Treatment Patient Details Name: [name] MRN: [id number] DOB: 01-07-1934 Today's Date: 03/05/2018    History of Present Illness Pt s/p R TKR and with hx of DM, CAD, and chronic back pain    PT Comments    Pt performed therex program with assist.  Marked improvement in flexion noted this pm.   Follow Up Recommendations  Follow surgeon's recommendation for DC plan and follow-up therapies     Equipment Recommendations  None recommended by PT    Recommendations for Other Services       Precautions / Restrictions Precautions Precautions: Fall Restrictions Weight Bearing Restrictions: No Other Position/Activity Restrictions: WBAT    Mobility  Bed Mobility Overal bed mobility: Needs Assistance Bed Mobility: Supine to Sit     Supine to sit: Min assist     General bed mobility comments: Pt up in chair and requests back to same  Transfers Overall transfer level: Needs assistance Equipment used: Rolling walker (2 wheeled) Transfers: Sit to/from Stand Sit to Stand: Min assist;Min guard         General transfer comment: cues for LE management and use of UEs to self assist  Ambulation/Gait Ambulation/Gait assistance: Min assist;Min guard Gait Distance (Feet): 140 Feet Assistive device: Rolling walker (2 wheeled) Gait Pattern/deviations: Step-to pattern;Step-through pattern;Decreased step length - right;Decreased step length - left;Shuffle;Trunk flexed Gait velocity: decr   General Gait Details: cues for posture, sequence and position from [employer] Mobility    Modified Rankin (Stroke Patients Only)       Balance Overall balance assessment: Mild deficits observed, not formally tested                                          Cognition Arousal/Alertness: Awake/alert Behavior During Therapy: WFL for tasks assessed/performed Overall Cognitive Status: Within Functional Limits for tasks  assessed                                        Exercises Total Joint Exercises Ankle Circles/Pumps: AROM;Both;Supine;20 reps Quad Sets: AROM;Both;10 reps;Supine Heel Slides: AAROM;Right;Supine;20 reps Straight Leg Raises: AAROM;AROM;Right;Supine;20 reps Goniometric ROM: AAROM R knee -8 - 75    General Comments        Pertinent Vitals/Pain Pain Assessment: 0-10 Pain Score: 3  Pain Location: R knee Pain Descriptors / Indicators: Aching;Sore Pain Intervention(s): Limited activity within patient's tolerance;Monitored during session;Premedicated before session;Ice applied    Home Living Family/patient expects to be discharged to:: Private residence Living Arrangements: Spouse/significant other Available Help at Discharge: Family Type of Home: House Home Access: Stairs to enter Entrance Stairs-Rails: Right;Left;Can reach both Home Layout: Able to live on main level with bedroom/bathroom Home Equipment: Walker - 2 wheels;Shower seat      Prior Function Level of Independence: Independent          PT Goals (current goals can now be found in the care plan section) Acute Rehab PT Goals Patient Stated Goal: Regain IND PT Goal Formulation: With patient Time For Goal Achievement: 03/12/18 Potential to Achieve Goals: Good Progress towards PT goals: Progressing toward goals    Frequency    7X/week      PT Plan Current plan remains appropriate  Co-evaluation              AM-PAC PT "6 Clicks" Mobility   Outcome Measure  Help needed turning from your back to your side while in a flat bed without using bedrails?: A Little Help needed moving from lying on your back to sitting on the side of a flat bed without using bedrails?: A Little Help needed moving to and from a bed to a chair (including a wheelchair)?: A Little Help needed standing up from a chair using your arms (e.g., wheelchair or bedside chair)?: A Little Help needed to walk in hospital  room?: A Little Help needed climbing 3-5 steps with a railing? : A Little 6 Click Score: 18    End of Session Equipment Utilized During Treatment: Gait belt Activity Tolerance: Patient tolerated treatment well Patient left: in chair;with call bell/phone within reach;with chair alarm set Nurse Communication: Mobility status PT Visit Diagnosis: Difficulty in walking, not elsewhere classified (R26.2)     Time: 6226-3335 PT Time Calculation (min) (ACUTE ONLY): 22 min  Charges:  $Gait Training: 8-22 mins $Therapeutic Exercise: 8-22 mins                     [place] Pager [phone] Office [phone]    Elya Diloreto 03/05/2018, 3:24 PM

## 2018-03-06 ENCOUNTER — Encounter (HOSPITAL_COMMUNITY): Payer: Self-pay | Admitting: Orthopedic Surgery

## 2018-03-06 LAB — CBC
HCT: 30.9 % — ABNORMAL LOW (ref 39.0–52.0)
Hemoglobin: 9.7 g/dL — ABNORMAL LOW (ref 13.0–17.0)
MCH: 26.2 pg (ref 26.0–34.0)
MCHC: 31.4 g/dL (ref 30.0–36.0)
MCV: 83.5 fL (ref 80.0–100.0)
Platelets: 188 10*3/uL (ref 150–400)
RBC: 3.7 MIL/uL — ABNORMAL LOW (ref 4.22–5.81)
RDW: 15.1 % (ref 11.5–15.5)
WBC: 9.6 10*3/uL (ref 4.0–10.5)
nRBC: 0 % (ref 0.0–0.2)

## 2018-03-06 LAB — GLUCOSE, CAPILLARY: Glucose-Capillary: 115 mg/dL — ABNORMAL HIGH (ref 70–99)

## 2018-03-06 NOTE — Progress Notes (Signed)
Physical Therapy Treatment Patient Details Name: [name] MRN: [id number] DOB: 1934-03-26 Today's Date: 03/06/2018    History of Present Illness Pt s/p R TKR and with hx of DM, CAD, and chronic back pain    PT Comments    Pt progressing well with mobility and ROM and eager for dc home.  Pt reviewed stairs and home therex program with written instruction provided.   Follow Up Recommendations  Follow surgeon's recommendation for DC plan and follow-up therapies     Equipment Recommendations  None recommended by PT    Recommendations for Other Services       Precautions / Restrictions Precautions Precautions: Fall Restrictions Weight Bearing Restrictions: No Other Position/Activity Restrictions: WBAT    Mobility  Bed Mobility               General bed mobility comments: Pt up in chair and requests back to same  Transfers Overall transfer level: Needs assistance Equipment used: Rolling walker (2 wheeled) Transfers: Sit to/from Stand Sit to Stand: Supervision         General transfer comment: cues for LE management and use of UEs to self assist  Ambulation/Gait Ambulation/Gait assistance: Min guard;Supervision Gait Distance (Feet): 250 Feet Assistive device: Rolling walker (2 wheeled) Gait Pattern/deviations: Step-to pattern;Step-through pattern;Decreased step length - right;Decreased step length - left;Shuffle;Trunk flexed Gait velocity: decr   General Gait Details: cues for posture, sequence and position from RW   Stairs Stairs: Yes Stairs assistance: Min guard Stair Management: Two rails;Step to pattern;Forwards Number of Stairs: 5 General stair comments: cues for sequence and foot placement   Wheelchair Mobility    Modified Rankin (Stroke Patients Only)       Balance Overall balance assessment: Mild deficits observed, not formally tested                                          Cognition Arousal/Alertness:  Awake/alert Behavior During Therapy: WFL for tasks assessed/performed Overall Cognitive Status: Within Functional Limits for tasks assessed                                        Exercises Total Joint Exercises Ankle Circles/Pumps: AROM;Both;Supine;20 reps Quad Sets: AROM;Both;Supine;15 reps Heel Slides: AAROM;Right;Supine;20 reps Straight Leg Raises: AAROM;AROM;Right;Supine;20 reps Long Arc Quad: AROM;Right;10 reps;Seated Knee Flexion: AROM;AAROM;Right;10 reps;Seated Goniometric ROM: AAROM R knee -8 - 80    General Comments        Pertinent Vitals/Pain Pain Assessment: 0-10 Pain Score: 5  Pain Location: R knee Pain Descriptors / Indicators: Aching;Sore Pain Intervention(s): Limited activity within patient's tolerance;Monitored during session;Premedicated before session;Ice applied    Home Living                      Prior Function            PT Goals (current goals can now be found in the care plan section) Acute Rehab PT Goals Patient Stated Goal: Regain IND PT Goal Formulation: With patient Time For Goal Achievement: 03/12/18 Potential to Achieve Goals: Good Progress towards PT goals: Progressing toward goals    Frequency    7X/week      PT Plan Current plan remains appropriate    Co-evaluation  AM-PAC PT "6 Clicks" Mobility   Outcome Measure  Help needed turning from your back to your side while in a flat bed without using bedrails?: None Help needed moving from lying on your back to sitting on the side of a flat bed without using bedrails?: None Help needed moving to and from a bed to a chair (including a wheelchair)?: A Little Help needed standing up from a chair using your arms (e.g., wheelchair or bedside chair)?: None Help needed to walk in hospital room?: None Help needed climbing 3-5 steps with a railing? : A Little 6 Click Score: 22    End of Session Equipment Utilized During Treatment: Gait  belt Activity Tolerance: Patient tolerated treatment well Patient left: in chair;with call bell/phone within reach;with chair alarm set;with family/visitor present Nurse Communication: Mobility status PT Visit Diagnosis: Difficulty in walking, not elsewhere classified (R26.2)     Time: 2897-9150 PT Time Calculation (min) (ACUTE ONLY): 43 min  Charges:  $Gait Training: 8-22 mins $Therapeutic Exercise: 8-22 mins $Therapeutic Activity: 8-22 mins                     Debe Coder PT Acute Rehabilitation Services Pager [phone] Office [phone]    Kamyia Thomason 03/06/2018, 10:25 AM

## 2018-03-06 NOTE — Progress Notes (Signed)
   Subjective: 2 Days Post-Op Procedure(s) (LRB): COMPUTER ASSISTED TOTAL KNEE ARTHROPLASTY (Right) Patient reports pain as mild.   Patient seen in rounds for Dr. [name].  Patient is well, and has had no acute complaints or problems other than mild discomfort in the right knee. States he is ready to go home. Denies chest pain, SOB, or calf pain. Voiding without difficulty and positive flatus.  Plan is to go Home after hospital stay.  Objective: Vital signs in last 24 hours: Temp:  [97.5 F (36.4 C)-98.5 F (36.9 C)] 98.2 F (36.8 C) (11/29 0624) Pulse Rate:  [56-79] 56 (11/29 0624) Resp:  [15-18] 15 (11/29 0624) BP: (138-192)/(47-100) 138/47 (11/29 0624) SpO2:  [98 %-100 %] 100 % (11/29 0624)  Intake/Output from previous day:  Intake/Output Summary (Last 24 hours) at 03/06/2018 0811 Last data filed at 03/06/2018 0800 Gross per 24 hour  Intake 786.57 ml  Output 1025 ml  Net -238.43 ml    Intake/Output this shift: Total I/O In: -  Out: 125 [Urine:125]  Labs: Recent Labs    03/05/18 0530 03/06/18 0525  HGB 10.6* 9.7*   Recent Labs    03/05/18 0530 03/06/18 0525  WBC 9.5 9.6  RBC 4.14* 3.70*  HCT 34.7* 30.9*  PLT 210 188   Recent Labs    03/05/18 0530  NA 135  K 4.8  CL 109  CO2 20*  BUN 22  CREATININE 1.77*  GLUCOSE 149*  CALCIUM 8.2*   Exam: General - Patient is Alert and Oriented Extremity - Neurologically intact Neurovascular intact Sensation intact distally Dorsiflexion/Plantar flexion intact Dressing/Incision - clean, dry, no drainage. Aquacell in place. Motor Function - intact, moving foot and toes well on exam.   Past Medical History:  Diagnosis Date  . Allergy   . CAD (coronary artery disease)    pci to LAD 10/09; stable CAD by cath 05/31/08; myoview 04/11/11- no ishcemia, EF 67%; echo 05/15/09- EF>55%, mod calcification of the aortic valve leaflets  . Chronic back pain   . ED (erectile dysfunction)   . Essential hypertension, benign     . Hyperlipidemia   . Multiple rib fractures    left 9th, 10th, and 11th posterior rib fractures S/P fall 12/09/2015/notes 12/12/2015  . Prostate cancer ([place])    s/p  . Type II or unspecified type diabetes mellitus without mention of complication, not stated as uncontrolled   . Ventricular tachycardia ([place])    resuscitated; monitor 05/2008; attempted T ablation 2/10- aborted due to inappropriate substrate    Assessment/Plan: 2 Days Post-Op Procedure(s) (LRB): COMPUTER ASSISTED TOTAL KNEE ARTHROPLASTY (Right) Principal Problem:   Osteoarthritis of right knee  Estimated body mass index is 31.19 kg/m as calculated from the following:   Height as of this encounter: 6' (1.829 m).   Weight as of this encounter: 104.3 kg. Up with therapy D/C IV fluids  DVT Prophylaxis - Aspirin Weight-bearing as tolerated  Plan for discharge around lunchtime after one session of therapy this AM. Scheduled for outpatient physical therapy at [facility] on Monday. Follow-up in the office in 2 weeks with Dr. [name].  Theresa Duty, PA-C Orthopedic Surgery 03/06/2018, 8:11 AM

## 2018-03-06 NOTE — Progress Notes (Signed)
Inpatient Diabetes Program Recommendations  AACE/ADA: New Consensus Statement on Inpatient Glycemic Control (2015)  Target Ranges:  Prepandial:   less than 140 mg/dL      Peak postprandial:   less than 180 mg/dL (1-2 hours)      Critically ill patients:  140 - 180 mg/dL   Lab Results  Component Value Date   GLUCAP 115 (H) 03/06/2018   HGBA1C 7.5 (A) 02/13/2018    Review of Glycemic Control Results for [name], [name] (MRN [id number]) as of 03/06/2018 09:26  Ref. Range 03/05/2018 16:52 03/05/2018 22:13 03/06/2018 07:57  Glucose-Capillary Latest Ref Range: 70 - 99 mg/dL 196 (H) 161 (H) 115 (H)   Diabetes history: Type 2 DM Outpatient Diabetes medications: Novolin 70/30 8 units BID, Metformin 1000 mg BID Current orders for Inpatient glycemic control: Novolog 0-15 units TID, Metformin 1000 mg BID  Inpatient Diabetes Program Recommendations:    Noted consult by RN.   Patient has received Decadron 5 mg x1 and 10 mg x1, thus anticipate blood glucose trends to increase. Current trends are within inpatient goals.   Patient has followed recently with PCP and would encourage to continue as A1C has decreased from 8.1% down to 7.5%. Given patient's age, this is appropriate target for A1C according to ADA.  Based off of guidelines for Metformin use at discharge, would encourage follow up with PCP to assess renal function Q 3 months with current GFR <45. Would encourage follow up and continue with current regimen.   Thanks, Bronson Curb, MSN, RNC-OB Diabetes Coordinator [phone] (8a-5p)

## 2018-03-06 NOTE — Progress Notes (Signed)
RN reviewed discharge instructions with patient and family. All questions answered.   Paperwork and prescriptions given.   NT rolled patient down with all belongings to family car. 

## 2018-03-06 NOTE — Anesthesia Postprocedure Evaluation (Signed)
Anesthesia Post Note  Patient: [name]  Procedure(s) Performed: COMPUTER ASSISTED TOTAL KNEE ARTHROPLASTY (Right )     Patient location during evaluation: PACU Anesthesia Type: Spinal and MAC Level of consciousness: oriented and awake and alert Pain management: pain level controlled Vital Signs Assessment: post-procedure vital signs reviewed and stable Respiratory status: spontaneous breathing, respiratory function stable and patient connected to nasal cannula oxygen Cardiovascular status: blood pressure returned to baseline and stable Postop Assessment: no headache, no backache and no apparent nausea or vomiting Anesthetic complications: no    Last Vitals:  Vitals:   03/05/18 2214 03/06/18 0624  BP: (!) 167/73 (!) 138/47  Pulse: 79 (!) 56  Resp: 18 15  Temp: (!) 36.4 C 36.8 C  SpO2: 100% 100%    Last Pain:  Vitals:   03/06/18 0934  TempSrc:   PainSc: [place]

## 2018-03-09 DIAGNOSIS — M25661 Stiffness of right knee, not elsewhere classified: Secondary | ICD-10-CM | POA: Diagnosis not present

## 2018-03-10 ENCOUNTER — Ambulatory Visit (INDEPENDENT_AMBULATORY_CARE_PROVIDER_SITE_OTHER): Payer: Medicare HMO

## 2018-03-10 ENCOUNTER — Encounter: Payer: Self-pay | Admitting: Family Medicine

## 2018-03-10 ENCOUNTER — Other Ambulatory Visit: Payer: Self-pay

## 2018-03-10 ENCOUNTER — Ambulatory Visit (INDEPENDENT_AMBULATORY_CARE_PROVIDER_SITE_OTHER): Payer: Medicare HMO | Admitting: Family Medicine

## 2018-03-10 VITALS — BP 139/65 | HR 70 | Temp 98.8°F | Resp 18 | Ht 72.0 in | Wt 230.0 lb

## 2018-03-10 DIAGNOSIS — R51 Headache: Secondary | ICD-10-CM | POA: Diagnosis not present

## 2018-03-10 DIAGNOSIS — Z9889 Other specified postprocedural states: Secondary | ICD-10-CM | POA: Diagnosis not present

## 2018-03-10 DIAGNOSIS — R066 Hiccough: Secondary | ICD-10-CM | POA: Diagnosis not present

## 2018-03-10 MED ORDER — BACLOFEN 5 MG PO TABS: 5.0000 mg | ORAL_TABLET | Freq: Three times a day (TID) | ORAL | 1 refills | Status: DC

## 2018-03-10 NOTE — Progress Notes (Signed)
Patient ID: [name], male    DOB: July 28, 1933  Age: 84 y.o. MRN: [id number]  Chief Complaint  Patient presents with  . Hiccups    since thanksgiving   . Shortness of Breath    no chest pain     Subjective:   6 days ago this patient underwent a knee replacement surgery.  The following day he developed hiccups which have persisted day and night.  He feels short of breath.  He is tired from not getting rest.  Apparently he had some hiccups following anesthesia once before according to his wife, but he does not recall that.  Current allergies, medications, problem list, past/family and social histories reviewed.  Objective:  BP 139/65   Pulse 70   Temp 98.8 F (37.1 C) (Oral)   Resp 18   Ht 6' (1.829 m)   Wt 230 lb (104.3 kg)   SpO2 99%   BMI 31.19 kg/m  No major acute distress.  Pickups.  Throat clear.  Neck supple without nodes or thyromegaly.  Chest clear.  Heart regular without murmur.  He apparently had spinal anesthetic  Assessment & Plan:   Assessment: 1. Intractable hiccups   2. History of epidural anesthesia       Plan: Check chest x-ray and treat with baclofen  Orders Placed This Encounter  Procedures  . DG Chest 2 View    Standing Status:   Future    Number of Occurrences:   1    Standing Expiration Date:   03/10/2019    Order Specific Question:   Reason for Exam (SYMPTOM  OR DIAGNOSIS REQUIRED)    Answer:   intractable hiccups    Order Specific Question:   Preferred imaging location?    Answer:   External    No orders of the defined types were placed in this encounter.        Patient Instructions   Drink plenty of fluids  Take baclofen 5 mg 1 pill 3 times daily.  If after 2 days you continue to cough, you can increase to 2 pills 3 times daily.  This is not working get rechecked.  Return as needed    If you have lab work done today you will be contacted with your lab results within the next 2 weeks.  If you have not heard from [place]  then please contact us. The fastest way to get your results is to register for My Chart.   IF you received an x-ray today, you will receive an invoice from [facility]. Please contact [facility] at [phone] with questions or concerns regarding your invoice.   IF you received labwork today, you will receive an invoice from [place]. Please contact [facility] at [phone] with questions or concerns regarding your invoice.   Our billing staff will not be able to assist you with questions regarding bills from these companies.  You will be contacted with the lab results as soon as they are available. The fastest way to get your results is to activate your My Chart account. Instructions are located on the last page of this paperwork. If you have not heard from [place] regarding the results in 2 weeks, please contact this office.        Return if symptoms worsen or fail to improve.   Ruben Reason, MD 03/10/2018

## 2018-03-10 NOTE — Patient Instructions (Addendum)
Drink plenty of fluids  Take baclofen 5 mg 1 pill 3 times daily.  If after 2 days you continue to cough, you can increase to 2 pills 3 times daily.  This is not working get rechecked.  Return as needed    If you have lab work done today you will be contacted with your lab results within the next 2 weeks.  If you have not heard from [place] then please contact us. The fastest way to get your results is to register for My Chart.   IF you received an x-ray today, you will receive an invoice from [facility]. Please contact [facility] at [phone] with questions or concerns regarding your invoice.   IF you received labwork today, you will receive an invoice from [facility]. Please contact [facility] at [phone] with questions or concerns regarding your invoice.   Our billing staff will not be able to assist you with questions regarding bills from these companies.  You will be contacted with the lab results as soon as they are available. The fastest way to get your results is to activate your My Chart account. Instructions are located on the last page of this paperwork. If you have not heard from [place] regarding the results in 2 weeks, please contact this office.

## 2018-03-11 DIAGNOSIS — M25661 Stiffness of right knee, not elsewhere classified: Secondary | ICD-10-CM | POA: Diagnosis not present

## 2018-03-13 DIAGNOSIS — M25661 Stiffness of right knee, not elsewhere classified: Secondary | ICD-10-CM | POA: Diagnosis not present

## 2018-03-16 DIAGNOSIS — M25661 Stiffness of right knee, not elsewhere classified: Secondary | ICD-10-CM | POA: Diagnosis not present

## 2018-03-17 DIAGNOSIS — Z96651 Presence of right artificial knee joint: Secondary | ICD-10-CM | POA: Diagnosis not present

## 2018-03-17 DIAGNOSIS — Z471 Aftercare following joint replacement surgery: Secondary | ICD-10-CM | POA: Diagnosis not present

## 2018-03-18 DIAGNOSIS — M25661 Stiffness of right knee, not elsewhere classified: Secondary | ICD-10-CM | POA: Diagnosis not present

## 2018-03-23 DIAGNOSIS — M25661 Stiffness of right knee, not elsewhere classified: Secondary | ICD-10-CM | POA: Diagnosis not present

## 2018-03-25 DIAGNOSIS — M25661 Stiffness of right knee, not elsewhere classified: Secondary | ICD-10-CM | POA: Diagnosis not present

## 2018-03-30 DIAGNOSIS — M25661 Stiffness of right knee, not elsewhere classified: Secondary | ICD-10-CM | POA: Diagnosis not present

## 2018-04-03 DIAGNOSIS — M25661 Stiffness of right knee, not elsewhere classified: Secondary | ICD-10-CM | POA: Diagnosis not present

## 2018-04-07 DIAGNOSIS — M25661 Stiffness of right knee, not elsewhere classified: Secondary | ICD-10-CM | POA: Diagnosis not present

## 2018-04-14 DIAGNOSIS — Z471 Aftercare following joint replacement surgery: Secondary | ICD-10-CM | POA: Diagnosis not present

## 2018-04-14 DIAGNOSIS — Z96651 Presence of right artificial knee joint: Secondary | ICD-10-CM | POA: Diagnosis not present

## 2018-04-24 ENCOUNTER — Telehealth: Payer: Self-pay | Admitting: Family Medicine

## 2018-04-24 DIAGNOSIS — R809 Proteinuria, unspecified: Principal | ICD-10-CM

## 2018-04-24 DIAGNOSIS — E1129 Type 2 diabetes mellitus with other diabetic kidney complication: Secondary | ICD-10-CM

## 2018-04-24 DIAGNOSIS — Z794 Long term (current) use of insulin: Secondary | ICD-10-CM

## 2018-04-24 DIAGNOSIS — E119 Type 2 diabetes mellitus without complications: Secondary | ICD-10-CM

## 2018-04-24 NOTE — Telephone Encounter (Signed)
I have attempted to call pt and there was no answer. I left a message to call back.  Plan:  Let pt know that we do not mind sending the medication to the [facility] in [facility], however that phone number and address doesn't work. It is not in our system and even when we called the number it was a busy signal.   Please provider [place] with a pharmacy to send the medication to. Also we are not fully sure it will be covered if you have recently picked it up, however we did notate that it was an Emergency refill.  Thanks, [employer]

## 2018-04-24 NOTE — Telephone Encounter (Signed)
I have called the pt back at [phone] and there was no answer. I have left a message to call back.  Plan: to tell the pt that we can send in some medication to the [facility] at [place], [place] [address], however it most likely may not be covered due to the fact that he has already gotten a box.  I will send the message to the provider to fill.  Thanks, [employer]

## 2018-04-24 NOTE — Telephone Encounter (Signed)
Patient is calling to check on the status of this request

## 2018-04-24 NOTE — Telephone Encounter (Signed)
Copied from [place]. Topic: Quick Communication - See Telephone Encounter >> Apr 24, 2018  1:56 PM Rutherford Nail, NT wrote: CRM for notification. See Telephone encounter for: 04/24/18. Patient calling and states that he had a death in the family and had to go to [place], [place]. States that he left his insulin at home by accident and does not have any. Would like to know if Dr Carlota Raspberry could a prescription for insulin and syringe needles to the [facility] in [place], [place]? Please advise. Would like a call back if this can be done.  Address: [address] [place] [address] Phone: [phone]

## 2018-04-25 NOTE — Telephone Encounter (Signed)
I have called pt back and informed him that we are willing to send the medication, however we would like to know where to send it to. The pharmacy information he gave [place] is not in the system and also the phone number kept giving [place] the busy signal. If he is able to call us back by 1 today we will call it or send it in today.  He stated understanding.  Thanks, [employer]

## 2018-05-01 DIAGNOSIS — M25661 Stiffness of right knee, not elsewhere classified: Secondary | ICD-10-CM | POA: Diagnosis not present

## 2018-05-06 DIAGNOSIS — M25661 Stiffness of right knee, not elsewhere classified: Secondary | ICD-10-CM | POA: Diagnosis not present

## 2018-05-08 DIAGNOSIS — M25661 Stiffness of right knee, not elsewhere classified: Secondary | ICD-10-CM | POA: Diagnosis not present

## 2018-05-12 DIAGNOSIS — M25661 Stiffness of right knee, not elsewhere classified: Secondary | ICD-10-CM | POA: Diagnosis not present

## 2018-05-15 DIAGNOSIS — M25661 Stiffness of right knee, not elsewhere classified: Secondary | ICD-10-CM | POA: Diagnosis not present

## 2018-05-19 DIAGNOSIS — M25661 Stiffness of right knee, not elsewhere classified: Secondary | ICD-10-CM | POA: Diagnosis not present

## 2018-05-21 ENCOUNTER — Ambulatory Visit: Payer: Medicare HMO | Admitting: Family Medicine

## 2018-05-22 ENCOUNTER — Other Ambulatory Visit: Payer: Self-pay | Admitting: Family Medicine

## 2018-05-27 ENCOUNTER — Telehealth: Payer: Self-pay | Admitting: *Deleted

## 2018-05-27 DIAGNOSIS — C679 Malignant neoplasm of bladder, unspecified: Secondary | ICD-10-CM | POA: Diagnosis not present

## 2018-05-27 DIAGNOSIS — N138 Other obstructive and reflux uropathy: Secondary | ICD-10-CM | POA: Diagnosis not present

## 2018-05-27 DIAGNOSIS — Z8551 Personal history of malignant neoplasm of bladder: Secondary | ICD-10-CM | POA: Diagnosis not present

## 2018-05-27 DIAGNOSIS — N401 Enlarged prostate with lower urinary tract symptoms: Secondary | ICD-10-CM | POA: Diagnosis not present

## 2018-05-27 DIAGNOSIS — R350 Frequency of micturition: Secondary | ICD-10-CM | POA: Diagnosis not present

## 2018-05-27 DIAGNOSIS — N3941 Urge incontinence: Secondary | ICD-10-CM | POA: Diagnosis not present

## 2018-05-27 DIAGNOSIS — Z8546 Personal history of malignant neoplasm of prostate: Secondary | ICD-10-CM | POA: Diagnosis not present

## 2018-05-27 DIAGNOSIS — N528 Other male erectile dysfunction: Secondary | ICD-10-CM | POA: Diagnosis not present

## 2018-05-27 NOTE — Telephone Encounter (Signed)
   Primary Cardiologist: Quay Burow, MD  Chart reviewed as part of pre-operative protocol coverage.  Left voice mail to call back between pre-op hours.  [place], [place] 05/27/2018, 11:49 AM

## 2018-05-27 NOTE — Telephone Encounter (Signed)
   [facility] Pre-operative Risk Assessment    Request for surgical clearance:  1. What type of surgery is being performed? Left total knee arthroplasty  2. When is this surgery scheduled? TBD   3. What type of clearance is required (medical clearance vs. Pharmacy clearance to hold med vs. Both)? medical  4. Are there any medications that need to be held prior to surgery and how long?none   5. Practice name and name of physician performing surgery? Dr [name]   6. What is your office phone number [phone]    7.   What is your office fax number [phone]  8.   Anesthesia type (None, local, MAC, general) ? general   [name] 05/27/2018, 10:34 AM  _________________________________________________________________   (provider comments below)

## 2018-06-01 NOTE — Telephone Encounter (Signed)
[name] was previously cleared by Dr. [name] in 01/2018 for keen surgery. Pt had his right knee done on 03/04/18. I called pt to check on his current status and any new changes. Left VM for him to call back.

## 2018-06-02 NOTE — Telephone Encounter (Signed)
   Primary Cardiologist: Quay Burow, MD  Chart reviewed as part of pre-operative protocol coverage. Patient was contacted 06/02/2018 in reference to pre-operative risk assessment for pending surgery as outlined below.  [name] was last seen on 02/03/18 by [name].  Since that day, [name] has done well. He has a remote hx of cardiac stent in 2009 and normal Myoview in 2013. He remains completely asymptomatic. He had knee surgery in 02/2018 and has done very well with that.   Therefore, based on ACC/AHA guidelines, the patient would be at acceptable risk for the planned procedure without further cardiovascular testing.   This clearance is only good for 8 weeks. If the surgery is after that we would need another request for clearance so that we can re-evaluate him.   I will route this recommendation to the requesting party via Epic fax function and remove from pre-op pool.  Please call with questions.  Daune Perch, NP 06/02/2018, 10:54 AM

## 2018-06-02 NOTE — Telephone Encounter (Signed)
VM left again for pt to call back

## 2018-06-11 ENCOUNTER — Ambulatory Visit (INDEPENDENT_AMBULATORY_CARE_PROVIDER_SITE_OTHER): Payer: Medicare HMO | Admitting: Family Medicine

## 2018-06-11 ENCOUNTER — Encounter: Payer: Self-pay | Admitting: Family Medicine

## 2018-06-11 ENCOUNTER — Other Ambulatory Visit: Payer: Self-pay

## 2018-06-11 VITALS — BP 160/68 | HR 83 | Temp 97.8°F | Resp 16 | Ht 72.0 in | Wt 223.4 lb

## 2018-06-11 DIAGNOSIS — I10 Essential (primary) hypertension: Secondary | ICD-10-CM

## 2018-06-11 DIAGNOSIS — R7989 Other specified abnormal findings of blood chemistry: Secondary | ICD-10-CM

## 2018-06-11 DIAGNOSIS — R809 Proteinuria, unspecified: Secondary | ICD-10-CM | POA: Diagnosis not present

## 2018-06-11 DIAGNOSIS — Z01818 Encounter for other preprocedural examination: Secondary | ICD-10-CM | POA: Diagnosis not present

## 2018-06-11 DIAGNOSIS — Z794 Long term (current) use of insulin: Secondary | ICD-10-CM | POA: Diagnosis not present

## 2018-06-11 DIAGNOSIS — E1129 Type 2 diabetes mellitus with other diabetic kidney complication: Secondary | ICD-10-CM

## 2018-06-11 LAB — POCT URINALYSIS DIP (MANUAL ENTRY)
Bilirubin, UA: NEGATIVE
Blood, UA: NEGATIVE
Glucose, UA: NEGATIVE mg/dL
Ketones, POC UA: NEGATIVE mg/dL
Leukocytes, UA: NEGATIVE
Nitrite, UA: NEGATIVE
Protein Ur, POC: >=300 mg/dL — AB
Spec Grav, UA: 1.01 (ref 1.010–1.025)
Urobilinogen, UA: 0.2 E.U./dL
pH, UA: 5 (ref 5.0–8.0)

## 2018-06-11 LAB — POCT GLYCOSYLATED HEMOGLOBIN (HGB A1C): Hemoglobin A1C: 6.8 % — AB (ref 4.0–5.6)

## 2018-06-11 LAB — POC MICROSCOPIC URINALYSIS (UMFC): Mucus: ABSENT

## 2018-06-11 LAB — GLUCOSE, POCT (MANUAL RESULT ENTRY): POC Glucose: 109 mg/dl — AB (ref 70–99)

## 2018-06-11 MED ORDER — METOPROLOL TARTRATE 50 MG PO TABS: 50.0000 mg | ORAL_TABLET | Freq: Two times a day (BID) | ORAL | 1 refills | Status: DC

## 2018-06-11 MED ORDER — METFORMIN HCL 1000 MG PO TABS: 1000.0000 mg | ORAL_TABLET | Freq: Two times a day (BID) | ORAL | 1 refills | Status: DC

## 2018-06-11 NOTE — Progress Notes (Signed)
Subjective:    Patient ID: [name], male    DOB: 06/25/1933, 85 y.o.   MRN: [id number]  HPI [name] is a 85 y.o. male Presents today for: Chief Complaint  Patient presents with  . Diabetes    3 mon f/u patient did not check glucose this am  . Hypertension    bp was running high last visit increased the amlodipine 7.5 mg and a 1/2 of the amoldipine 5 mg   Here for initial plan follow-up of diabetes, hypertension and routine meds, but also is requiring preoperative risk evaluation for left total knee arthroplasty with Dr. [name] with [facility], spinal anesthesia.  Paperwork reviewed indicating need for EKG, CBC, BMP, albumin, hemoglobin A1c and urinalysis.  Need for albumin greater than 3, hemoglobin greater than 12, A1c less than 7.5.  He does report that cardiology has completed the cardiac preoperative risk evaluation(history of CAD with prior PVCs, ventricular tachycardia.).  He did undergo right total knee arthroplasty on 03/04/2018. Dr. [name] provided cardiac clearance. R knee did ok. No problems with anesthesia. Plans on surgery in 5-6 weeks.   Quit smoking last year.  Has not restarted. Most recent chest x-ray March 10, 2018, no active cardiopulmonary disease, chronic pleural blunting/scarring on the left and aortic atherosclerosis.    Was recently evaluated February 19 by Dr. Nevada Acosta at [facility].  History of prostate cancer, bladder cancer, erectile dysfunction.  Had a TURBT plus chemo in July 2019.  Continue on tamsulosin, plan for 47-monthrecheck for repeat cystoscopy  Diabetes: Last A1c 7.5 in November. Takes 8 units of 70/30 insulin twice daily, metformin 1000 mg twice daily No symptomatic lows, no new side effects.  Low 100's - not below.   Microalbumin: Elevated at 1066.8 in May 2019.  He is on lisinopril as ACE inhibitor, Lipitor for ACE. Optho, foot exam, pneumovax: up to date  Lab Results  Component Value Date   HGBA1C 7.5 (A) 02/13/2018   HGBA1C 8.1 (H) 11/11/2017   HGBA1C 7.3 (H) 08/07/2017   Lab Results  Component Value Date   MICROALBUR 83.3 05/17/2015   LDLCALC 62 02/13/2018   CREATININE 1.77 (H) 03/05/2018  last GFR  41 on 03/05/18  Hypertension: BP Readings from Last 3 Encounters:  06/11/18 (!) 160/68  03/10/18 139/65  03/06/18 (!) 138/47   Lab Results  Component Value Date   CREATININE 1.77 (H) 03/05/2018  norvasc incr to 7.5 mg last visit. No missed doses. Lisinopril 216mqd, metoprolol 5062mID.  Feels like knee pain may be from pain.  appt with cardiology in 01/2018 - BP 136/78.    Patient Active Problem List   Diagnosis Date Noted  . Osteoarthritis of right knee 03/04/2018  . Rib pain on left side 07/01/2016  . Fall 12/14/2015  . Elevated serum creatinine 12/14/2015  . Ileus ([place]9/10/2015  . Multiple fractures of ribs of left side 12/12/2015  . Prostate cancer ([facility]2/18/2016  . Blood in the urine 12/01/2013  . Cystitis, radiation 12/01/2013  . Hematuria 12/01/2013  . 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 Malignant neoplasm of prostate ([facility]8/21/2015  . Male erectile dysfunction 11/26/2013  . Pulsatile tinnitus 11/19/2013  . Rectal bleeding 12/23/2012  . Type II or unspecified type diabetes mellitus without mention of complication, not stated as uncontrolled   . Essential hypertension, benign   . Chronic back pain   . ED (erectile dysfunction)   . Anemia   . Hyperlipidemia   .  CAD, NATIVE VESSEL 05/08/2008  . VENTRICULAR TACHYCARDIA 05/08/2008  . PREMATURE VENTRICULAR CONTRACTIONS 05/08/2008   Past Medical History:  Diagnosis Date  . Allergy   . CAD (coronary artery disease)    pci to LAD 10/09; stable CAD by cath 05/31/08; myoview 04/11/11- no ishcemia, EF 67%; echo 05/15/09- EF>55%, mod calcification of the aortic valve leaflets  . Chronic back pain   . ED (erectile dysfunction)   . Essential hypertension, benign   . Hyperlipidemia   . Multiple rib  fractures    left 9th, 10th, and 11th posterior rib fractures S/P fall 12/09/2015/notes 12/12/2015  . Prostate cancer ([place])    s/p  . Type II or unspecified type diabetes mellitus without mention of complication, not stated as uncontrolled   . Ventricular tachycardia ([place])    resuscitated; monitor 05/2008; attempted T ablation 2/10- aborted due to inappropriate substrate   Past Surgical History:  Procedure Laterality Date  . CARDIAC CATHETERIZATION  05/31/08   EF 55-60%, stable lesions, medical therapy  . COLECTOMY  '80's   polyps  . CORONARY ANGIOPLASTY WITH STENT PLACEMENT  01/19/08   PCI stent to mid LAD with Promus DES 27.5x28  . IR GENERIC HISTORICAL  01/03/2016   IR THORACENTESIS ASP PLEURAL SPACE W/IMG GUIDE 01/03/2016 [facility]-INTERV RAD  . KNEE ARTHROPLASTY Right 03/04/2018   Procedure: COMPUTER ASSISTED TOTAL KNEE ARTHROPLASTY;  Surgeon: Rod Can, MD;  Location: [facility];  Service: Orthopedics;  Laterality: Right;  . PTCA  01/2008   Allergies  Allergen Reactions  . Contrast Media [Iodinated Diagnostic Agents] Swelling and Other (See Comments)    Possible neck swelling after contrast for chest CT 03/2016. No hives, throat or other symptoms   Prior to Admission medications   Medication Sig Start Date End Date Taking? Authorizing Provider  amLODipine (NORVASC) 5 MG tablet Take 1.5 tablets (7.5 mg total) by mouth daily. 02/13/18  Yes [name], MD  atorvastatin (LIPITOR) 10 MG tablet Take 1 tablet (10 mg total) by mouth daily. 02/13/18  Yes [name], MD  Blood Glucose Monitoring Suppl (ACCU-CHEK AVIVA PLUS) w/Device KIT Test blood sugar 3 times daily. Dx code: E11.22 04/03/16  Yes [name], MD  ferrous sulfate 325 (65 FE) MG tablet Take 325 mg by mouth daily with breakfast.   Yes [provider]  glucose blood (ACCU-CHEK AVIVA PLUS) test strip Test blood sugar 3 times daily. Dx code: E11.22 04/03/16  Yes [name], MD  insulin NPH-regular Human  (NOVOLIN 70/30) (70-30) 100 UNIT/ML injection ADMINISTER 8 UNITS UNDER THE SKIN TWICE DAILY WITH A MEAL Patient taking differently: Inject 8 Units into the skin 2 (two) times daily with a meal.  02/13/18  Yes [name], MD  Insulin Syringe-Needle U-100 (B-D INS SYR HALF-UNIT .3CC/31G) 31G X 5/16" 0.3 ML MISC Use daily at bedtime to inject insulin. Dx code: 250.00 06/21/13  Yes [name], MD  Lancets (ACCU-CHEK MULTICLIX) lancets Test blood sugar 3 times daily. Dx code: E11.22 04/03/16  Yes [name], MD  lisinopril (PRINIVIL,ZESTRIL) 20 MG tablet TAKE 1 TABLET EVERY DAY 05/22/18  Yes [name], MD  metFORMIN (GLUCOPHAGE) 1000 MG tablet Take 1 tablet (1,000 mg total) by mouth 2 (two) times daily with a meal. 02/13/18  Yes [name], MD  metoprolol tartrate (LOPRESSOR) 50 MG tablet Take 1 tablet (50 mg total) by mouth 2 (two) times daily. 02/13/18  Yes [name], MD  mirabegron [facility]) 25  MG TB24 tablet Take 25 mg by mouth daily.   Yes [provider]  Multiple Vitamin (MULTIVITAMIN) tablet Take 1 tablet by mouth daily. COMPLETE SENIOR VITAMINS AND M   Yes [provider]  Omega-3 Fatty Acids (FISH OIL) 1000 MG CAPS Take 2 capsules by mouth daily.    Yes [provider]  ondansetron (ZOFRAN) 4 MG tablet Take 1 tablet (4 mg total) by mouth every 8 (eight) hours as needed for nausea or vomiting. 03/04/18  Yes [place], Aaron Edelman, MD  Vitamin D, Cholecalciferol, 1000 UNITS TABS Take 1 tablet by mouth daily.    Yes [provider]   Social History   Socioeconomic History  . Marital status: Legally Separated    Spouse name: Not on file  . Number of children: 7  . Years of education: Not on file  . Highest education level: Not on file  Occupational History  . Occupation: RETIRED    Employer: RETIRED  Social Needs  . Financial resource strain: Not on file  . Food insecurity:    Worry: Not on file    Inability: Not on  file  . Transportation needs:    Medical: Not on file    Non-medical: Not on file  Tobacco Use  . Smoking status: Former Smoker    Packs/day: 0.00    Years: 15.00    Pack years: 0.00    Types: Cigarettes    Last attempt to quit: 05/10/2017    Years since quitting: 1.0  . Smokeless tobacco: Never Used  . Tobacco comment: Patient smokes occasionally. Not everyday.  Substance and Sexual Activity  . Alcohol use: No  . Drug use: No  . Sexual activity: Never  Lifestyle  . Physical activity:    Days per week: Not on file    Minutes per session: Not on file  . Stress: Not on file  Relationships  . Social connections:    Talks on phone: Not on file    Gets together: Not on file    Attends religious service: Not on file    Active member of club or organization: Not on file    Attends meetings of clubs or organizations: Not on file    Relationship status: Not on file  . Intimate partner violence:    Fear of current or ex partner: Not on file    Emotionally abused: Not on file    Physically abused: Not on file    Forced sexual activity: Not on file  Other Topics Concern  . Not on file  Social History Narrative   29 grandchildren. Single. Education: 12 th grade. Exercise: 3-4 times a week for 30 minutes working out and set ups.    Review of Systems  Constitutional: Negative for fatigue and unexpected weight change.  Eyes: Negative for visual disturbance.  Respiratory: Negative for cough, chest tightness and shortness of breath.   Cardiovascular: Negative for chest pain, palpitations and leg swelling.  Gastrointestinal: Negative for abdominal pain and blood in stool.  Neurological: Negative for dizziness, light-headedness and headaches.       Objective:   Physical Exam Vitals signs reviewed.  Constitutional:      Appearance: He is well-developed.  HENT:     Head: Normocephalic and atraumatic.  Eyes:     Pupils: Pupils are equal, round, and reactive to light.  Neck:      Vascular: No carotid bruit or JVD.  Cardiovascular:     Rate and Rhythm: Normal rate and  regular rhythm.     Heart sounds: Normal heart sounds. No murmur.  Pulmonary:     Effort: Pulmonary effort is normal.     Breath sounds: Normal breath sounds. No rales.  Skin:    General: Skin is warm and dry.  Neurological:     Mental Status: He is alert and oriented to person, place, and time.    Vitals:   06/11/18 1415 06/11/18 1435  BP: (!) 174/74 (!) 160/68  Pulse: 83   Resp: 16   Temp: 97.8 F (36.6 C)   TempSrc: Oral   SpO2: 98%   Weight: 223 lb 6.4 oz (101.3 kg)   Height: 6' (1.829 m)      Results for orders placed or performed in visit on 06/11/18  POCT glucose (manual entry)  Result Value Ref Range   POC Glucose 109 (A) 70 - 99 mg/dl  POCT glycosylated hemoglobin (Hb A1C)  Result Value Ref Range   Hemoglobin A1C 6.8 (A) 4.0 - 5.6 %   HbA1c POC (<> result, manual entry)     HbA1c, POC (prediabetic range)     HbA1c, POC (controlled diabetic range)         Assessment & Plan:    JOHNAVON MCCLAFFERTY is a 85 y.o. male Type 2 diabetes mellitus with microalbuminuria, with long-term current use of insulin ([place]) - Plan: POCT glucose (manual entry), POCT glycosylated hemoglobin (Hb A1C), Comprehensive metabolic panel, metFORMIN (GLUCOPHAGE) 1000 MG tablet  -Stable control.  No changes.  Elevated serum creatinine - Plan: Comprehensive metabolic panel  -Repeat creatinine.  Avoid nephrotoxins.  Consider nephrology eval if changes.   Preoperative evaluation to rule out surgical contraindication - Plan: Comprehensive metabolic panel, Protime-INR, CBC, POCT urinalysis dipstick, POCT Microscopic Urinalysis ([facility])  -Medical preop eval, cardiology has performed preoperative cardiac eval.  Diabetes.  Stable.  Other labs as above.  Plan on possible x-ray next visit when following up lab work to determine clearance.  Would like to see improved blood pressure control as well.  Essential  hypertension - Plan: Comprehensive metabolic panel, CBC, metoprolol tartrate (LOPRESSOR) 50 MG tablet, CANCELED: Basic metabolic panel  -Decreased control, thought to be in part due to pain.  We will continue same regimen for now, monitor home readings, recheck next few weeks  Meds ordered this encounter  Medications  . metFORMIN (GLUCOPHAGE) 1000 MG tablet    Sig: Take 1 tablet (1,000 mg total) by mouth 2 (two) times daily with a meal.    Dispense:  180 tablet    Refill:  1  . metoprolol tartrate (LOPRESSOR) 50 MG tablet    Sig: Take 1 tablet (50 mg total) by mouth 2 (two) times daily.    Dispense:  180 tablet    Refill:  1   Patient Instructions   Kidney function test was on the lower side last time.  I will check that again today to make sure still safe to use metformin.  No medication changes for diabetes for now.  Bring your medications with you next time so we can double check to what you are taking and how matches what we have here.  Diabetes test was stable today so no changes to the insulin or metformin for right now but again we need to keep an eye on the kidney function.   Keep a record of your blood pressures outside of the office and bring them to the next office visit.  I did check tests needed further preoperative evaluation today  but we likely will do an x-ray next time as well as any other testing might be needed   How to Take Your Blood Pressure Blood pressure is a measurement of how strongly your blood is pressing against the walls of your arteries. Arteries are blood vessels that carry blood from your heart throughout your body. Your health care provider takes your blood pressure at each office visit. You can also take your own blood pressure at home with a blood pressure machine. You may need to take your own blood pressure:  To confirm a diagnosis of high blood pressure (hypertension).  To monitor your blood pressure over time.  To make sure your blood pressure  medicine is working. Supplies needed: To take your blood pressure, you will need a blood pressure machine. You can buy a blood pressure machine, or blood pressure monitor, at most drugstores or online. There are several types of home blood pressure monitors. When choosing one, consider the following:  Choose a monitor that has an arm cuff.  Choose a cuff that wraps snugly around your upper arm. You should be able to fit only one finger between your arm and the cuff.  Do not choose a monitor that measures your blood pressure from your wrist or finger. Your health care provider can suggest a reliable monitor that will meet your needs. How to prepare To get the most accurate reading, avoid the following for 30 minutes before you check your blood pressure:  Drinking caffeine.  Drinking alcohol.  Eating.  Smoking.  Exercising. Five minutes before you check your blood pressure:  Empty your bladder.  Sit quietly without talking in a dining chair, rather than in a soft couch or armchair. How to take your blood pressure To check your blood pressure, follow the instructions in the manual that came with your blood pressure monitor. If you have a digital blood pressure monitor, the instructions may be as follows: 1. Sit up straight. 2. Place your feet on the floor. Do not cross your ankles or legs. 3. Rest your left arm at the level of your heart on a table or desk or on the arm of a chair. 4. Pull up your shirt sleeve. 5. Wrap the blood pressure cuff around the upper part of your left arm, 1 inch (2.5 cm) above your elbow. It is best to wrap the cuff around bare skin. 6. Fit the cuff snugly around your arm. You should be able to place only one finger between the cuff and your arm. 7. Position the cord inside the groove of your elbow. 8. Press the power button. 9. Sit quietly while the cuff inflates and deflates. 10. Read the digital reading on the monitor screen and write it down (record  it). 11. Wait 2-3 minutes, then repeat the steps, starting at step 1. What does my blood pressure reading mean? A blood pressure reading consists of a higher number over a lower number. Ideally, your blood pressure should be below 120/80. The first ("top") number is called the systolic pressure. It is a measure of the pressure in your arteries as your heart beats. The second ("bottom") number is called the diastolic pressure. It is a measure of the pressure in your arteries as the heart relaxes. Blood pressure is classified into four stages. The following are the stages for adults who do not have a short-term serious illness or a chronic condition. Systolic pressure and diastolic pressure are measured in a unit called mm Hg. Normal  Systolic pressure: below 644.  Diastolic pressure: below 80. Elevated  Systolic pressure: 034-742.  Diastolic pressure: below 80. Hypertension stage 1  Systolic pressure: 595-638.  Diastolic pressure: 75-64. Hypertension stage 2  Systolic pressure: 332 or above.  Diastolic pressure: 90 or above. You can have prehypertension or hypertension even if only the systolic or only the diastolic number in your reading is higher than normal. Follow these instructions at home:  Check your blood pressure as often as recommended by your health care provider.  Take your monitor to the next appointment with your health care provider to make sure: ? That you are using it correctly. ? That it provides accurate readings.  Be sure you understand what your goal blood pressure numbers are.  Tell your health care provider if you are having any side effects from blood pressure medicine. Contact a health care provider if:  Your blood pressure is consistently high. Get help right away if:  Your systolic blood pressure is higher than 180.  Your diastolic blood pressure is higher than 110. This information is not intended to replace advice given to you by your health  care provider. Make sure you discuss any questions you have with your health care provider. Document Released: 09/01/2015 Document Revised: 02/04/2017 Document Reviewed: 09/01/2015 Elsevier Interactive Patient Education  2019 [place].    Type 2 Diabetes Mellitus, Self Care, Adult When you have type 2 diabetes (type 2 diabetes mellitus), you must make sure your blood sugar (glucose) stays in a healthy range. You can do this with:  Nutrition.  Exercise.  Lifestyle changes.  Medicines or insulin, if needed.  Support from your doctors and others. How to stay aware of blood sugar   Check your blood sugar level every day, as often as told.  Have your A1c (hemoglobin A1c) level checked two or more times a year. Have it checked more often if your doctor tells you to. Your doctor will set personal treatment goals for you. Generally, you should have these blood sugar levels:  Before meals (preprandial): 80-130 mg/dL (4.4-7.2 mmol/L).  After meals (postprandial): below 180 mg/dL (10 mmol/L).  A1c level: less than 7%. How to manage high and low blood sugar Signs of high blood sugar High blood sugar is called hyperglycemia. Know the signs of high blood sugar. Signs may include:  Feeling: ? Thirsty. ? Hungry. ? Very tired.  Needing to pee (urinate) more than usual.  Blurry vision. Signs of low blood sugar Low blood sugar is called hypoglycemia. This is when blood sugar is at or below 70 mg/dL (3.9 mmol/L). Signs may include:  Feeling: ? Hungry. ? Worried or nervous (anxious). ? Sweaty and clammy. ? Confused. ? Dizzy. ? Sleepy. ? Sick to your stomach (nauseous).  Having: ? A fast heartbeat. ? A headache. ? A change in your vision. ? Jerky movements that you cannot control (seizure). ? Tingling or no feeling (numbness) around your mouth, lips, or tongue.  Having trouble with: ? Moving (coordination). ? Sleeping. ? Passing out (fainting). ? Getting upset easily  (irritability). Treating low blood sugar To treat low blood sugar, eat or drink something sugary right away. If you can think clearly and swallow safely, follow the 15:15 rule:  Take 15 grams of a fast-acting carb (carbohydrate). Talk with your doctor about how much you should take.  Some fast-acting carbs are: ? Sugar tablets (glucose pills). Take 3-4 pills. ? 6-8 pieces of hard candy. ? 4-6 oz (120-150 mL) of fruit juice. ?  4-6 oz (120-150 mL) of regular (not diet) soda. ? 1 Tbsp (15 mL) honey or sugar.  Check your blood sugar 15 minutes after you take the carb.  If your blood sugar is still at or below 70 mg/dL (3.9 mmol/L), take 15 grams of a carb again.  If your blood sugar does not go above 70 mg/dL (3.9 mmol/L) after 3 tries, get help right away.  After your blood sugar goes back to normal, eat a meal or a snack within 1 hour. Treating very low blood sugar If your blood sugar is at or below 54 mg/dL (3 mmol/L), you have very low blood sugar (severe hypoglycemia). This is an emergency. Do not wait to see if the symptoms will go away. Get medical help right away. Call your local emergency services (911 in the [place] If you have very low blood sugar and you cannot eat or drink, you may need a glucagon shot (injection). A family member or friend should learn how to check your blood sugar and how to give you a glucagon shot. Ask your doctor if you need to have a glucagon shot kit at home. Follow these instructions at home: Medicine  Take insulin and diabetes medicines as told.  If your doctor says you should take more or less insulin and medicines, do this exactly as told.  Do not run out of insulin or medicines. Having diabetes can raise your risk for other long-term conditions. These include heart disease and kidney disease. Your doctor may prescribe medicines to help you not have these problems. Food   Make healthy food choices. These include: ? Chicken, fish, egg whites, and  beans. ? Oats, whole wheat, bulgur, brown rice, quinoa, and millet. ? Fresh fruits and vegetables. ? Low-fat dairy products. ? Nuts, avocado, olive oil, and canola oil.  Meet with a food specialist (dietitian). He or she can help you make an eating plan that is right for you.  Follow instructions from your doctor about what you cannot eat or drink.  Drink enough fluid to keep your pee (urine) pale yellow.  Keep track of carbs that you eat. Do this by reading food labels and learning food serving sizes.  Follow your sick day plan when you cannot eat or drink normally. Make this plan with your doctor so it is ready to use. Activity  Exercise 3 or more times a week.  Do not go more than 2 days without exercising.  Talk with your doctor before you start a new exercise. Your doctor may need to tell you to change: ? How much insulin or medicines you take. ? How much food you eat. Lifestyle  Do not use any tobacco products. These include cigarettes, chewing tobacco, and e-cigarettes. If you need help quitting, ask your doctor.  Ask your doctor how much alcohol is safe for you.  Learn to deal with stress. If you need help with this, ask your doctor. Body care   Stay up to date with your shots (immunizations).  Have your eyes and feet checked by a doctor as often as told.  Check your skin and feet every day. Check for cuts, bruises, redness, blisters, or sores.  Brush your teeth and gums two times a day. Floss one or more times a day.  Go to the dentist one or more times every 6 months.  Stay at a healthy weight. General instructions  Take over-the-counter and prescription medicines only as told by your doctor.  Share your diabetes care  plan with: ? Your work or school. ? People you live with.  Carry a card or wear jewelry that says you have diabetes.  Keep all follow-up visits as told by your doctor. This is important. Questions to ask your doctor  Do I need to meet  with a diabetes educator?  Where can I find a support group for people with diabetes? Where to find more information To learn more about diabetes, visit:  American Diabetes Association: www.diabetes.org  American Association of Diabetes Educators: www.diabeteseducator.org Summary  When you have type 2 diabetes, you must make sure your blood sugar (glucose) stays in a healthy range.  Check your blood sugar every day, as often as told.  Having diabetes can raise your risk for other conditions. Your doctor may prescribe medicines to help you not have these problems.  Keep all follow-up visits as told by your doctor. This is important. This information is not intended to replace advice given to you by your health care provider. Make sure you discuss any questions you have with your health care provider. Document Released: 07/17/2015 Document Revised: 09/15/2017 Document Reviewed: 04/28/2015 Elsevier Interactive Patient Education  [employer].   If you have lab work done today you will be contacted with your lab results within the next 2 weeks.  If you have not heard from [place] then please contact us. The fastest way to get your results is to register for My Chart.   IF you received an x-ray today, you will receive an invoice from [facility]. Please contact [facility] Radiology at [phone] with questions or concerns regarding your invoice.   IF you received labwork today, you will receive an invoice from [facility]. Please contact [facility] at [phone] with questions or concerns regarding your invoice.   Our billing staff will not be able to assist you with questions regarding bills from these companies.  You will be contacted with the lab results as soon as they are available. The fastest way to get your results is to activate your My Chart account. Instructions are located on the last page of this paperwork. If you have not heard from [place] regarding the results in 2  weeks, please contact this office.       Signed,   [name], MD [facility] [place].  06/14/18 11:42 AM

## 2018-06-11 NOTE — Patient Instructions (Addendum)
Kidney function test was on the lower side last time.  I will check that again today to make sure still safe to use metformin.  No medication changes for diabetes for now.  Bring your medications with you next time so we can double check to what you are taking and how matches what we have here.  Diabetes test was stable today so no changes to the insulin or metformin for right now but again we need to keep an eye on the kidney function.   Keep a record of your blood pressures outside of the office and bring them to the next office visit.  I did check tests needed further preoperative evaluation today but we likely will do an x-ray next time as well as any other testing might be needed   How to Take Your Blood Pressure Blood pressure is a measurement of how strongly your blood is pressing against the walls of your arteries. Arteries are blood vessels that carry blood from your heart throughout your body. Your health care provider takes your blood pressure at each office visit. You can also take your own blood pressure at home with a blood pressure machine. You may need to take your own blood pressure:  To confirm a diagnosis of high blood pressure (hypertension).  To monitor your blood pressure over time.  To make sure your blood pressure medicine is working. Supplies needed: To take your blood pressure, you will need a blood pressure machine. You can buy a blood pressure machine, or blood pressure monitor, at most drugstores or online. There are several types of home blood pressure monitors. When choosing one, consider the following:  Choose a monitor that has an arm cuff.  Choose a cuff that wraps snugly around your upper arm. You should be able to fit only one finger between your arm and the cuff.  Do not choose a monitor that measures your blood pressure from your wrist or finger. Your health care provider can suggest a reliable monitor that will meet your needs. How to prepare To get  the most accurate reading, avoid the following for 30 minutes before you check your blood pressure:  Drinking caffeine.  Drinking alcohol.  Eating.  Smoking.  Exercising. Five minutes before you check your blood pressure:  Empty your bladder.  Sit quietly without talking in a dining chair, rather than in a soft couch or armchair. How to take your blood pressure To check your blood pressure, follow the instructions in the manual that came with your blood pressure monitor. If you have a digital blood pressure monitor, the instructions may be as follows: 1. Sit up straight. 2. Place your feet on the floor. Do not cross your ankles or legs. 3. Rest your left arm at the level of your heart on a table or desk or on the arm of a chair. 4. Pull up your shirt sleeve. 5. Wrap the blood pressure cuff around the upper part of your left arm, 1 inch (2.5 cm) above your elbow. It is best to wrap the cuff around bare skin. 6. Fit the cuff snugly around your arm. You should be able to place only one finger between the cuff and your arm. 7. Position the cord inside the groove of your elbow. 8. Press the power button. 9. Sit quietly while the cuff inflates and deflates. 10. Read the digital reading on the monitor screen and write it down (record it). 11. Wait 2-3 minutes, then repeat the steps, starting at  step 1. What does my blood pressure reading mean? A blood pressure reading consists of a higher number over a lower number. Ideally, your blood pressure should be below 120/80. The first ("top") number is called the systolic pressure. It is a measure of the pressure in your arteries as your heart beats. The second ("bottom") number is called the diastolic pressure. It is a measure of the pressure in your arteries as the heart relaxes. Blood pressure is classified into four stages. The following are the stages for adults who do not have a short-term serious illness or a chronic condition. Systolic  pressure and diastolic pressure are measured in a unit called mm Hg. Normal  Systolic pressure: below 093.  Diastolic pressure: below 80. Elevated  Systolic pressure: 235-573.  Diastolic pressure: below 80. Hypertension stage 1  Systolic pressure: 220-254.  Diastolic pressure: 27-06. Hypertension stage 2  Systolic pressure: 237 or above.  Diastolic pressure: 90 or above. You can have prehypertension or hypertension even if only the systolic or only the diastolic number in your reading is higher than normal. Follow these instructions at home:  Check your blood pressure as often as recommended by your health care provider.  Take your monitor to the next appointment with your health care provider to make sure: ? That you are using it correctly. ? That it provides accurate readings.  Be sure you understand what your goal blood pressure numbers are.  Tell your health care provider if you are having any side effects from blood pressure medicine. Contact a health care provider if:  Your blood pressure is consistently high. Get help right away if:  Your systolic blood pressure is higher than 180.  Your diastolic blood pressure is higher than 110. This information is not intended to replace advice given to you by your health care provider. Make sure you discuss any questions you have with your health care provider. Document Released: 09/01/2015 Document Revised: 02/04/2017 Document Reviewed: 09/01/2015 Elsevier Interactive Patient Education  2019 [employer].    Type 2 Diabetes Mellitus, Self Care, Adult When you have type 2 diabetes (type 2 diabetes mellitus), you must make sure your blood sugar (glucose) stays in a healthy range. You can do this with:  Nutrition.  Exercise.  Lifestyle changes.  Medicines or insulin, if needed.  Support from your doctors and others. How to stay aware of blood sugar   Check your blood sugar level every day, as often as  told.  Have your A1c (hemoglobin A1c) level checked two or more times a year. Have it checked more often if your doctor tells you to. Your doctor will set personal treatment goals for you. Generally, you should have these blood sugar levels:  Before meals (preprandial): 80-130 mg/dL (4.4-7.2 mmol/L).  After meals (postprandial): below 180 mg/dL (10 mmol/L).  A1c level: less than 7%. How to manage high and low blood sugar Signs of high blood sugar High blood sugar is called hyperglycemia. Know the signs of high blood sugar. Signs may include:  Feeling: ? Thirsty. ? Hungry. ? Very tired.  Needing to pee (urinate) more than usual.  Blurry vision. Signs of low blood sugar Low blood sugar is called hypoglycemia. This is when blood sugar is at or below 70 mg/dL (3.9 mmol/L). Signs may include:  Feeling: ? Hungry. ? Worried or nervous (anxious). ? Sweaty and clammy. ? Confused. ? Dizzy. ? Sleepy. ? Sick to your stomach (nauseous).  Having: ? A fast heartbeat. ? A headache. ?  A change in your vision. ? Jerky movements that you cannot control (seizure). ? Tingling or no feeling (numbness) around your mouth, lips, or tongue.  Having trouble with: ? Moving (coordination). ? Sleeping. ? Passing out (fainting). ? Getting upset easily (irritability). Treating low blood sugar To treat low blood sugar, eat or drink something sugary right away. If you can think clearly and swallow safely, follow the 15:15 rule:  Take 15 grams of a fast-acting carb (carbohydrate). Talk with your doctor about how much you should take.  Some fast-acting carbs are: ? Sugar tablets (glucose pills). Take 3-4 pills. ? 6-8 pieces of hard candy. ? 4-6 oz (120-150 mL) of fruit juice. ? 4-6 oz (120-150 mL) of regular (not diet) soda. ? 1 Tbsp (15 mL) honey or sugar.  Check your blood sugar 15 minutes after you take the carb.  If your blood sugar is still at or below 70 mg/dL (3.9 mmol/L), take 15  grams of a carb again.  If your blood sugar does not go above 70 mg/dL (3.9 mmol/L) after 3 tries, get help right away.  After your blood sugar goes back to normal, eat a meal or a snack within 1 hour. Treating very low blood sugar If your blood sugar is at or below 54 mg/dL (3 mmol/L), you have very low blood sugar (severe hypoglycemia). This is an emergency. Do not wait to see if the symptoms will go away. Get medical help right away. Call your local emergency services (911 in the [place] If you have very low blood sugar and you cannot eat or drink, you may need a glucagon shot (injection). A family member or friend should learn how to check your blood sugar and how to give you a glucagon shot. Ask your doctor if you need to have a glucagon shot kit at home. Follow these instructions at home: Medicine  Take insulin and diabetes medicines as told.  If your doctor says you should take more or less insulin and medicines, do this exactly as told.  Do not run out of insulin or medicines. Having diabetes can raise your risk for other long-term conditions. These include heart disease and kidney disease. Your doctor may prescribe medicines to help you not have these problems. Food   Make healthy food choices. These include: ? Chicken, fish, egg whites, and beans. ? Oats, whole wheat, bulgur, brown rice, quinoa, and millet. ? Fresh fruits and vegetables. ? Low-fat dairy products. ? Nuts, avocado, olive oil, and canola oil.  Meet with a food specialist (dietitian). He or she can help you make an eating plan that is right for you.  Follow instructions from your doctor about what you cannot eat or drink.  Drink enough fluid to keep your pee (urine) pale yellow.  Keep track of carbs that you eat. Do this by reading food labels and learning food serving sizes.  Follow your sick day plan when you cannot eat or drink normally. Make this plan with your doctor so it is ready to  use. Activity  Exercise 3 or more times a week.  Do not go more than 2 days without exercising.  Talk with your doctor before you start a new exercise. Your doctor may need to tell you to change: ? How much insulin or medicines you take. ? How much food you eat. Lifestyle  Do not use any tobacco products. These include cigarettes, chewing tobacco, and e-cigarettes. If you need help quitting, ask your doctor.  Ask your  doctor how much alcohol is safe for you.  Learn to deal with stress. If you need help with this, ask your doctor. Body care   Stay up to date with your shots (immunizations).  Have your eyes and feet checked by a doctor as often as told.  Check your skin and feet every day. Check for cuts, bruises, redness, blisters, or sores.  Brush your teeth and gums two times a day. Floss one or more times a day.  Go to the dentist one or more times every 6 months.  Stay at a healthy weight. General instructions  Take over-the-counter and prescription medicines only as told by your doctor.  Share your diabetes care plan with: ? Your work or school. ? People you live with.  Carry a card or wear jewelry that says you have diabetes.  Keep all follow-up visits as told by your doctor. This is important. Questions to ask your doctor  Do I need to meet with a diabetes educator?  Where can I find a support group for people with diabetes? Where to find more information To learn more about diabetes, visit:  American Diabetes Association: www.diabetes.org  American Association of Diabetes Educators: www.diabeteseducator.org Summary  When you have type 2 diabetes, you must make sure your blood sugar (glucose) stays in a healthy range.  Check your blood sugar every day, as often as told.  Having diabetes can raise your risk for other conditions. Your doctor may prescribe medicines to help you not have these problems.  Keep all follow-up visits as told by your doctor.  This is important. This information is not intended to replace advice given to you by your health care provider. Make sure you discuss any questions you have with your health care provider. Document Released: 07/17/2015 Document Revised: 09/15/2017 Document Reviewed: 04/28/2015 Elsevier Interactive Patient Education  [employer].   If you have lab work done today you will be contacted with your lab results within the next 2 weeks.  If you have not heard from [place] then please contact us. The fastest way to get your results is to register for My Chart.   IF you received an x-ray today, you will receive an invoice from [facility]. Please contact [facility]iology at [phone] with questions or concerns regarding your invoice.   IF you received labwork today, you will receive an invoice from [place]. Please contact [facility] at [phone] with questions or concerns regarding your invoice.   Our billing staff will not be able to assist you with questions regarding bills from these companies.  You will be contacted with the lab results as soon as they are available. The fastest way to get your results is to activate your My Chart account. Instructions are located on the last page of this paperwork. If you have not heard from [place] regarding the results in 2 weeks, please contact this office.

## 2018-06-12 LAB — COMPREHENSIVE METABOLIC PANEL
ALT: 12 IU/L (ref 0–44)
AST: 8 IU/L (ref 0–40)
Albumin/Globulin Ratio: 1.5 (ref 1.2–2.2)
Albumin: 3.9 g/dL (ref 3.6–4.6)
Alkaline Phosphatase: 45 IU/L (ref 39–117)
BUN/Creatinine Ratio: 12 (ref 10–24)
BUN: 18 mg/dL (ref 8–27)
Bilirubin Total: 0.2 mg/dL (ref 0.0–1.2)
CO2: 19 mmol/L — ABNORMAL LOW (ref 20–29)
Calcium: 9.2 mg/dL (ref 8.6–10.2)
Chloride: 104 mmol/L (ref 96–106)
Creatinine, Ser: 1.53 mg/dL — ABNORMAL HIGH (ref 0.76–1.27)
GFR calc Af Amer: 48 mL/min/{1.73_m2} — ABNORMAL LOW (ref >59)
GFR calc non Af Amer: 42 mL/min/{1.73_m2} — ABNORMAL LOW (ref >59)
Globulin, Total: 2.6 g/dL (ref 1.5–4.5)
Glucose: 90 mg/dL (ref 65–99)
Potassium: 4.4 mmol/L (ref 3.5–5.2)
Sodium: 139 mmol/L (ref 134–144)
Total Protein: 6.5 g/dL (ref 6.0–8.5)

## 2018-06-12 LAB — PROTIME-INR
INR: 1 (ref 0.8–1.2)
Prothrombin Time: 10.5 s (ref 9.1–12.0)

## 2018-06-12 LAB — CBC
Hematocrit: 31.7 % — ABNORMAL LOW (ref 37.5–51.0)
Hemoglobin: 10.5 g/dL — ABNORMAL LOW (ref 13.0–17.7)
MCH: 25.1 pg — ABNORMAL LOW (ref 26.6–33.0)
MCHC: 33.1 g/dL (ref 31.5–35.7)
MCV: 76 fL — ABNORMAL LOW (ref 79–97)
Platelets: 254 10*3/uL (ref 150–450)
RBC: 4.19 x10E6/uL (ref 4.14–5.80)
RDW: 15.8 % — ABNORMAL HIGH (ref 11.6–15.4)
WBC: 6.5 10*3/uL (ref 3.4–10.8)

## 2018-06-14 ENCOUNTER — Encounter: Payer: Self-pay | Admitting: Family Medicine

## 2018-06-22 ENCOUNTER — Encounter: Payer: Self-pay | Admitting: Radiology

## 2018-07-02 ENCOUNTER — Other Ambulatory Visit: Payer: Self-pay

## 2018-07-02 ENCOUNTER — Telehealth (INDEPENDENT_AMBULATORY_CARE_PROVIDER_SITE_OTHER): Payer: Medicare HMO | Admitting: Family Medicine

## 2018-07-02 DIAGNOSIS — R809 Proteinuria, unspecified: Secondary | ICD-10-CM

## 2018-07-02 DIAGNOSIS — E1129 Type 2 diabetes mellitus with other diabetic kidney complication: Secondary | ICD-10-CM | POA: Diagnosis not present

## 2018-07-02 DIAGNOSIS — I10 Essential (primary) hypertension: Secondary | ICD-10-CM | POA: Diagnosis not present

## 2018-07-02 DIAGNOSIS — Z794 Long term (current) use of insulin: Secondary | ICD-10-CM

## 2018-07-02 NOTE — Progress Notes (Signed)
Virtual Visit via Telephone Note  I connected with [name] on 07/02/18 at 12:30 PM by telephone and verified that I am speaking with the correct person using two identifiers.   I discussed the limitations, risks, security and privacy concerns of performing an evaluation and management service by telephone and the availability of in person appointments. I also discussed with the patient that there may be a patient responsible charge related to this service. The patient expressed understanding and agreed to proceed, consent obtained  Chief complaint: HTN followup   History of Present Illness:  Hypertension: BP Readings from Last 3 Encounters:  06/11/18 (!) 160/68  03/10/18 139/65  03/06/18 (!) 138/47   Lab Results  Component Value Date   CREATININE 1.53 (H) 06/11/2018  Decreased control at last visit.  Was having some knee pain at that time.  Plan for left total knee arthroplasty with [facility], Dr. [name].  Had a cardiology complete cardiac preoperative risk evaluation.  A1c was stable with a reading of 6.8 on March 5.  surgery but may be delayed due to COVID-19 pandemic at this time.  Plan for chest x-ray prior to that surgery. He was continued on same antihypertensives, advised to monitor home readings. Has not measured home readings - no BP cuff at home. Feels ok. No chest pain, no dyspnea, no HA or vision changes.  No dark stools or blood in stools.  Cr 1.77 to 1.53.   Diabetes: Improved last A1c.no med changes.  Lab Results  Component Value Date   HGBA1C 6.8 (A) 06/11/2018   HGBA1C 7.5 (A) 02/13/2018   HGBA1C 8.1 (H) 11/11/2017   Lab Results  Component Value Date   MICROALBUR 83.3 05/17/2015   LDLCALC 62 02/13/2018   CREATININE 1.53 (H) 06/11/2018   Has been staying at home as much as possible. Some errands. Feeling well.    Patient Active Problem List   Diagnosis Date Noted  . Osteoarthritis of right knee 03/04/2018  . Rib pain on left side  07/01/2016  . Fall 12/14/2015  . Elevated serum creatinine 12/14/2015  . Ileus ([place]) 12/14/2015  . Multiple fractures of ribs of left side 12/12/2015  . Prostate cancer ([place]) 05/26/2014  . Blood in the urine 12/01/2013  . Cystitis, radiation 12/01/2013  . Hematuria 12/01/2013  . 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 Malignant neoplasm of prostate ([place]) 11/26/2013  . Male erectile dysfunction 11/26/2013  . Pulsatile tinnitus 11/19/2013  . Rectal bleeding 12/23/2012  . Type II or unspecified type diabetes mellitus without mention of complication, not stated as uncontrolled   . Essential hypertension, benign   . Chronic back pain   . ED (erectile dysfunction)   . Anemia   . Hyperlipidemia   . CAD, NATIVE VESSEL 05/08/2008  . VENTRICULAR TACHYCARDIA 05/08/2008  . PREMATURE VENTRICULAR CONTRACTIONS 05/08/2008   Past Medical History:  Diagnosis Date  . Allergy   . CAD (coronary artery disease)    pci to LAD 10/09; stable CAD by cath 05/31/08; myoview 04/11/11- no ishcemia, EF 67%; echo 05/15/09- EF>55%, mod calcification of the aortic valve leaflets  . Chronic back pain   . ED (erectile dysfunction)   . Essential hypertension, benign   . Hyperlipidemia   . Multiple rib fractures    left 9th, 10th, and 11th posterior rib fractures S/P fall 12/09/2015/notes 12/12/2015  . Prostate cancer ([place])    s/p  . Type II or unspecified type diabetes mellitus without mention of complication, not stated as uncontrolled   .  Ventricular tachycardia ([place])    resuscitated; monitor 05/2008; attempted T ablation 2/10- aborted due to inappropriate substrate   Past Surgical History:  Procedure Laterality Date  . CARDIAC CATHETERIZATION  05/31/08   EF 55-60%, stable lesions, medical therapy  . COLECTOMY  '80's   polyps  . CORONARY ANGIOPLASTY WITH STENT PLACEMENT  01/19/08   PCI stent to mid LAD with Promus DES 27.5x28  . IR GENERIC HISTORICAL  01/03/2016   IR THORACENTESIS ASP PLEURAL SPACE W/IMG GUIDE  01/03/2016 [facility]-INTERV RAD  . KNEE ARTHROPLASTY Right 03/04/2018   Procedure: COMPUTER ASSISTED TOTAL KNEE ARTHROPLASTY;  Surgeon: Rod Can, MD;  Location: [facility] ORS;  Service: Orthopedics;  Laterality: Right;  . PTCA  01/2008   Allergies  Allergen Reactions  . Contrast Media [Iodinated Diagnostic Agents] Swelling and Other (See Comments)    Possible neck swelling after contrast for chest CT 03/2016. No hives, throat or other symptoms   Prior to Admission medications   Medication Sig Start Date End Date Taking? Authorizing Provider  amLODipine (NORVASC) 5 MG tablet Take 1.5 tablets (7.5 mg total) by mouth daily. 02/13/18  Yes [name], MD  atorvastatin (LIPITOR) 10 MG tablet Take 1 tablet (10 mg total) by mouth daily. 02/13/18  Yes [name], MD  Blood Glucose Monitoring Suppl (ACCU-CHEK AVIVA PLUS) w/Device KIT Test blood sugar 3 times daily. Dx code: E11.22 04/03/16  Yes [name], MD  ferrous sulfate 325 (65 FE) MG tablet Take 325 mg by mouth daily with breakfast.   Yes [provider]  glucose blood (ACCU-CHEK AVIVA PLUS) test strip Test blood sugar 3 times daily. Dx code: E11.22 04/03/16  Yes [name], MD  insulin NPH-regular Human (NOVOLIN 70/30) (70-30) 100 UNIT/ML injection ADMINISTER 8 UNITS UNDER THE SKIN TWICE DAILY WITH A MEAL Patient taking differently: Inject 8 Units into the skin 2 (two) times daily with a meal.  02/13/18  Yes [name], MD  Insulin Syringe-Needle U-100 (B-D INS SYR HALF-UNIT .3CC/31G) 31G X 5/16" 0.3 ML MISC Use daily at bedtime to inject insulin. Dx code: 250.00 06/21/13  Yes [name], MD  Lancets (ACCU-CHEK MULTICLIX) lancets Test blood sugar 3 times daily. Dx code: E11.22 04/03/16  Yes [name], MD  lisinopril (PRINIVIL,ZESTRIL) 20 MG tablet TAKE 1 TABLET EVERY DAY 05/22/18  Yes [name], MD  metFORMIN (GLUCOPHAGE) 1000 MG tablet Take 1 tablet (1,000 mg total) by mouth 2 (two) times  daily with a meal. 06/11/18  Yes [name], MD  metoprolol tartrate (LOPRESSOR) 50 MG tablet Take 1 tablet (50 mg total) by mouth 2 (two) times daily. 06/11/18  Yes [name], MD  mirabegron ER (MYRBETRIQ) 25 MG TB24 tablet Take 25 mg by mouth daily.   Yes [provider]  Multiple Vitamin (MULTIVITAMIN) tablet Take 1 tablet by mouth daily. COMPLETE SENIOR VITAMINS AND M   Yes [provider]  Omega-3 Fatty Acids (FISH OIL) 1000 MG CAPS Take 2 capsules by mouth daily.    Yes [provider]  Vitamin D, Cholecalciferol, 1000 UNITS TABS Take 1 tablet by mouth daily.    Yes [provider]   Social History   Socioeconomic History  . Marital status: Legally Separated    Spouse name: Not on file  . Number of children: 7  . Years of education: Not on file  . Highest education level: Not on file  Occupational History  . Occupation: RETIRED  Employer: RETIRED  Social Needs  . Financial resource strain: Not on file  . Food insecurity:    Worry: Not on file    Inability: Not on file  . Transportation needs:    Medical: Not on file    Non-medical: Not on file  Tobacco Use  . Smoking status: Former Smoker    Packs/day: 0.00    Years: 15.00    Pack years: 0.00    Types: Cigarettes    Last attempt to quit: 05/10/2017    Years since quitting: 1.1  . Smokeless tobacco: Never Used  . Tobacco comment: Patient smokes occasionally. Not everyday.  Substance and Sexual Activity  . Alcohol use: No  . Drug use: No  . Sexual activity: Never  Lifestyle  . Physical activity:    Days per week: Not on file    Minutes per session: Not on file  . Stress: Not on file  Relationships  . Social connections:    Talks on phone: Not on file    Gets together: Not on file    Attends religious service: Not on file    Active member of club or organization: Not on file    Attends meetings of clubs or organizations: Not on file    Relationship status: Not on  file  . Intimate partner violence:    Fear of current or ex partner: Not on file    Emotionally abused: Not on file    Physically abused: Not on file    Forced sexual activity: Not on file  Other Topics Concern  . Not on file  Social History Narrative   29 grandchildren. Single. Education: 12 th grade. Exercise: 3-4 times a week for 30 minutes working out and set ups.     Observations/Objective: No distress on phone  Assessment and Plan: Essential hypertension  -Advised to monitor blood pressure at home with meter if that can be purchased by family member at [employer].  If that is cost prohibitive he is to let me know and we will arrange for some kind meter for him to monitor his home readings.  Once he monitors readings if blood pressures remain over 140/90, can increase to full 10 mg of amlodipine or 2 pills of his 5 mg doses.  Potential side effects at highest dose discussed including pedal edema.  Plan was written by him at home during phone call and understanding expressed but also will have his daughter review notes and call if there are questions.  Type 2 diabetes mellitus with microalbuminuria, with long-term current use of insulin (HCC)  -Improved control on most recent testing, no med changes.  Anticipatory guidance was given including protecting himself during the COVID-19 pandemic.  Stressed importance of handwashing, remaining at home is much as possible, and minimizing contact with others during this time.  If he has any signs of illness he is to call right away so we can discuss treatment and monitoring.   Follow Up Instructions: Recheck 3 months, sooner if any new symptoms or concerns   I discussed the assessment and treatment plan with the patient. The patient was provided an opportunity to ask questions and all were answered. The patient agreed with the plan and demonstrated an understanding of the instructions.   The patient was advised to call back or seek an  in-person evaluation if the symptoms worsen or if the condition fails to improve as anticipated.  I provided 19 minutes of non-face-to-face time during this encounter.  Signed,   [name], MD [facility] at [place].  07/02/18

## 2018-07-02 NOTE — Progress Notes (Signed)
BP f/u.

## 2018-07-13 ENCOUNTER — Telehealth: Payer: Self-pay | Admitting: Family Medicine

## 2018-07-13 ENCOUNTER — Ambulatory Visit: Payer: Self-pay

## 2018-07-13 NOTE — Telephone Encounter (Signed)
Patient called in and says his BP is 188/84 today about 20 minutes ago and he wants to know if he needs to increase his BP medicine. He says he's not having any symptoms. I called the office and spoke to [place], [facility] who says to transfer the patient to her, call successfully transferred.  Reason for Disposition . Systolic BP  >= 562 OR Diastolic >= 563  Answer Assessment - Initial Assessment Questions 1. BLOOD PRESSURE: "What is the blood pressure?" "Did you take at least two measurements 5 minutes apart?"     188/89  2. ONSET: "When did you take your blood pressure?"     20 minutes ago 3. HOW: "How did you obtain the blood pressure?" (e.g., visiting nurse, automatic home BP monitor)     Home BP monitor 4. HISTORY: "Do you have a history of high blood pressure?"     Yes 5. MEDICATIONS: "Are you taking any medications for blood pressure?" "Have you missed any doses recently?"     No 6. OTHER SYMPTOMS: "Do you have any symptoms?" (e.g., headache, chest pain, blurred vision, difficulty breathing, weakness)     No 7. PREGNANCY: "Is there any chance you are pregnant?" "When was your last menstrual period?"     No  Protocols used: HIGH BLOOD PRESSURE-A-AH

## 2018-07-13 NOTE — Telephone Encounter (Signed)
Patient is schedule tomorrow to see Dr Joellyn Rued for his Bp

## 2018-07-13 NOTE — Telephone Encounter (Signed)
Copied from [place] #[id number]. Topic: Quick Communication - See Telephone Encounter >> Jul 13, 2018 12:50 PM Blase Mess A wrote: CRM for notification. See Telephone encounter for: 07/13/18.  Patient is calling to report that BP is still high. 188/89-today Please advise. Does the medication need to be increased. Please advise.CB- [phone] (M)

## 2018-07-14 ENCOUNTER — Encounter: Payer: Self-pay | Admitting: Emergency Medicine

## 2018-07-14 ENCOUNTER — Telehealth (INDEPENDENT_AMBULATORY_CARE_PROVIDER_SITE_OTHER): Payer: Medicare HMO | Admitting: Emergency Medicine

## 2018-07-14 DIAGNOSIS — I10 Essential (primary) hypertension: Secondary | ICD-10-CM | POA: Diagnosis not present

## 2018-07-14 MED ORDER — AMLODIPINE BESYLATE 10 MG PO TABS: 10.0000 mg | ORAL_TABLET | Freq: Every day | ORAL | 3 refills | Status: DC

## 2018-07-14 NOTE — Progress Notes (Signed)
BP Readings from Last 3 Encounters:  06/11/18 (!) 160/68  03/10/18 139/65  03/06/18 (!) 138/47      Telemedicine Encounter- SOAP NOTE Established Patient  This telephone encounter was conducted with the patient's (or proxy's) verbal consent via audio telecommunications: yes/no: Yes Patient was instructed to have this encounter in a suitably private space; and to only have persons present to whom they give permission to participate. In addition, patient identity was confirmed by use of name plus two identifiers (DOB and address).  I discussed the limitations, risks, security and privacy concerns of performing an evaluation and management service by telephone and the availability of in person appointments. I also discussed with the patient that there may be a patient responsible charge related to this service. The patient expressed understanding and agreed to proceed.  I spent a total of TIME; 0 MIN TO 60 MIN: 15 minutes talking with the patient or their proxy.  No chief complaint on file. Hypertension follow-up  Subjective   [name] is a 85 y.o. male established patient. Telephone visit today for hypertension follow-up.  States blood pressure has been on the high side, 189/90, 161/91, 161/79, has been taking amlodipine 7.5 mg daily.  Asymptomatic.  Also taking Lopressor 50 mg twice a day.  HPI   Patient Active Problem List   Diagnosis Date Noted  . Osteoarthritis of right knee 03/04/2018  . Rib pain on left side 07/01/2016  . Fall 12/14/2015  . Elevated serum creatinine 12/14/2015  . Ileus ([place]) 12/14/2015  . Multiple fractures of ribs of left side 12/12/2015  . Prostate cancer ([place]) 05/26/2014  . Blood in the urine 12/01/2013  . Cystitis, radiation 12/01/2013  . Hematuria 12/01/2013  . 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 Malignant neoplasm of prostate ([place]) 11/26/2013  . Male erectile dysfunction 11/26/2013  . Pulsatile tinnitus 11/19/2013  . Rectal bleeding 12/23/2012   . Type II or unspecified type diabetes mellitus without mention of complication, not stated as uncontrolled   . Essential hypertension, benign   . Chronic back pain   . ED (erectile dysfunction)   . Anemia   . Hyperlipidemia   . CAD, NATIVE VESSEL 05/08/2008  . VENTRICULAR TACHYCARDIA 05/08/2008  . PREMATURE VENTRICULAR CONTRACTIONS 05/08/2008    Past Medical History:  Diagnosis Date  . Allergy   . CAD (coronary artery disease)    pci to LAD 10/09; stable CAD by cath 05/31/08; myoview 04/11/11- no ishcemia, EF 67%; echo 05/15/09- EF>55%, mod calcification of the aortic valve leaflets  . Chronic back pain   . ED (erectile dysfunction)   . Essential hypertension, benign   . Hyperlipidemia   . Multiple rib fractures    left 9th, 10th, and 11th posterior rib fractures S/P fall 12/09/2015/notes 12/12/2015  . Prostate cancer ([place])    s/p  . Type II or unspecified type diabetes mellitus without mention of complication, not stated as uncontrolled   . Ventricular tachycardia ([place])    resuscitated; monitor 05/2008; attempted T ablation 2/10- aborted due to inappropriate substrate    Current Outpatient Medications  Medication Sig Dispense Refill  . atorvastatin (LIPITOR) 10 MG tablet Take 1 tablet (10 mg total) by mouth daily. 90 tablet 1  . Blood Glucose Monitoring Suppl (ACCU-CHEK AVIVA PLUS) w/Device KIT Test blood sugar 3 times daily. Dx code: E11.22 1 kit 0  . ferrous sulfate 325 (65 FE) MG tablet Take 325 mg by mouth daily with breakfast.    . glucose blood (ACCU-CHEK AVIVA PLUS)  test strip Test blood sugar 3 times daily. Dx code: E11.22 300 each 3  . insulin NPH-regular Human (NOVOLIN 70/30) (70-30) 100 UNIT/ML injection ADMINISTER 8 UNITS UNDER THE SKIN TWICE DAILY WITH A MEAL (Patient taking differently: Inject 8 Units into the skin 2 (two) times daily with a meal. ) 15 mL 2  . Insulin Syringe-Needle U-100 (B-D INS SYR HALF-UNIT .3CC/31G) 31G X 5/16" 0.3 ML MISC Use daily at bedtime to  inject insulin. Dx code: 250.00 100 each 3  . Lancets (ACCU-CHEK MULTICLIX) lancets Test blood sugar 3 times daily. Dx code: E11.22 300 each 3  . lisinopril (PRINIVIL,ZESTRIL) 20 MG tablet TAKE 1 TABLET EVERY DAY 90 tablet 1  . metFORMIN (GLUCOPHAGE) 1000 MG tablet Take 1 tablet (1,000 mg total) by mouth 2 (two) times daily with a meal. 180 tablet 1  . metoprolol tartrate (LOPRESSOR) 50 MG tablet Take 1 tablet (50 mg total) by mouth 2 (two) times daily. 180 tablet 1  . mirabegron ER (MYRBETRIQ) 25 MG TB24 tablet Take 25 mg by mouth daily.    . Multiple Vitamin (MULTIVITAMIN) tablet Take 1 tablet by mouth daily. COMPLETE SENIOR VITAMINS AND M    . Omega-3 Fatty Acids (FISH OIL) 1000 MG CAPS Take 2 capsules by mouth daily.     . Vitamin D, Cholecalciferol, 1000 UNITS TABS Take 1 tablet by mouth daily.     Marland Kitchen amLODipine (NORVASC) 10 MG tablet Take 1 tablet (10 mg total) by mouth daily. 90 tablet 3   No current facility-administered medications for this visit.     Allergies  Allergen Reactions  . Contrast Media [Iodinated Diagnostic Agents] Swelling and Other (See Comments)    Possible neck swelling after contrast for chest CT 03/2016. No hives, throat or other symptoms    Social History   Socioeconomic History  . Marital status: Legally Separated    Spouse name: Not on file  . Number of children: 7  . Years of education: Not on file  . Highest education level: Not on file  Occupational History  . Occupation: RETIRED    Employer: RETIRED  Social Needs  . Financial resource strain: Not on file  . Food insecurity:    Worry: Not on file    Inability: Not on file  . Transportation needs:    Medical: Not on file    Non-medical: Not on file  Tobacco Use  . Smoking status: Former Smoker    Packs/day: 0.00    Years: 15.00    Pack years: 0.00    Types: Cigarettes    Last attempt to quit: 05/10/2017    Years since quitting: 1.1  . Smokeless tobacco: Never Used  . Tobacco comment:  Patient smokes occasionally. Not everyday.  Substance and Sexual Activity  . Alcohol use: No  . Drug use: No  . Sexual activity: Never  Lifestyle  . Physical activity:    Days per week: Not on file    Minutes per session: Not on file  . Stress: Not on file  Relationships  . Social connections:    Talks on phone: Not on file    Gets together: Not on file    Attends religious service: Not on file    Active member of club or organization: Not on file    Attends meetings of clubs or organizations: Not on file    Relationship status: Not on file  . Intimate partner violence:    Fear of current or ex partner: Not on  file    Emotionally abused: Not on file    Physically abused: Not on file    Forced sexual activity: Not on file  Other Topics Concern  . Not on file  Social History Narrative   29 grandchildren. Single. Education: 12 th grade. Exercise: 3-4 times a week for 30 minutes working out and set ups.    Review of Systems  Constitutional: Negative.  Negative for fever.  HENT: Negative for congestion and sore throat.   Respiratory: Negative.  Negative for cough and shortness of breath.   Cardiovascular: Negative.  Negative for chest pain and palpitations.  Gastrointestinal: Negative for abdominal pain, blood in stool, diarrhea, nausea and vomiting.  Genitourinary: Negative for dysuria and hematuria.  Musculoskeletal: Positive for joint pain.  Skin: Negative.  Negative for rash.  Neurological: Negative for dizziness and headaches.  Endo/Heme/Allergies: Negative.   All other systems reviewed and are negative.   Objective   Vitals as reported by the patient: Average 160-190/70-90 There were no vitals filed for this visit.  Diagnoses and all orders for this visit:  Essential hypertension  Other orders -     amLODipine (NORVASC) 10 MG tablet; Take 1 tablet (10 mg total) by mouth daily.  Will increase amlodipine to 10 mg a day.  Continue all other medications.  Clinically stable.  No other medical concerns identified during this visit. Follow-up with Dr. Carlota Raspberry in 3 to 6 months.   I discussed the assessment and treatment plan with the patient. The patient was provided an opportunity to ask questions and all were answered. The patient agreed with the plan and demonstrated an understanding of the instructions.   The patient was advised to call back or seek an in-person evaluation if the symptoms worsen or if the condition fails to improve as anticipated.  I provided 15 minutes of non-face-to-face time during this encounter.  Horald Pollen, MD  Primary Care at [facility]

## 2018-07-14 NOTE — Progress Notes (Signed)
Pt c/o of BP meds would like to discuss maybe changing dosage. His BP has been running 189/98 and then recheck 45 mins after and it was 161/90. No travel

## 2018-07-28 ENCOUNTER — Other Ambulatory Visit: Payer: Self-pay | Admitting: Family Medicine

## 2018-07-28 DIAGNOSIS — I10 Essential (primary) hypertension: Secondary | ICD-10-CM

## 2018-07-28 DIAGNOSIS — E785 Hyperlipidemia, unspecified: Secondary | ICD-10-CM

## 2018-07-28 DIAGNOSIS — R809 Proteinuria, unspecified: Principal | ICD-10-CM

## 2018-07-28 DIAGNOSIS — E1129 Type 2 diabetes mellitus with other diabetic kidney complication: Secondary | ICD-10-CM

## 2018-07-28 DIAGNOSIS — Z794 Long term (current) use of insulin: Secondary | ICD-10-CM

## 2018-07-28 NOTE — Telephone Encounter (Signed)
Requested Prescriptions  Pending Prescriptions Disp Refills  . metFORMIN (GLUCOPHAGE) 1000 MG tablet [Pharmacy Med Name: METFORMIN HYDROCHLORIDE 1000 MG Tablet] 180 tablet 1    Sig: TAKE 1 TABLET TWICE DAILY WITH MEALS     Endocrinology:  Diabetes - Biguanides Failed - 07/28/2018  1:37 PM      Failed - Cr in normal range and within 360 days    Creat  Date Value Ref Range Status  01/30/2016 1.27 (H) 0.70 - 1.18 mg/dL Final    Comment:      For patients > or = 83 years of age: The upper reference limit for Creatinine is approximately 13% higher for people identified as African-American.      Creatinine, Ser  Date Value Ref Range Status  06/11/2018 1.53 (H) 0.76 - 1.27 mg/dL Final         Failed - eGFR in normal range and within 360 days    GFR, Est African American  Date Value Ref Range Status  08/30/2015 58 (L) >=60 mL/min Final   GFR calc Af Amer  Date Value Ref Range Status  06/11/2018 48 (L) >59 mL/min/1.73 Final   GFR, Est Non African American  Date Value Ref Range Status  08/30/2015 50 (L) >=60 mL/min Final    Comment:      The estimated GFR is a calculation valid for adults (>=57 years old) that uses the CKD-EPI algorithm to adjust for age and sex. It is   not to be used for children, pregnant women, hospitalized patients,    patients on dialysis, or with rapidly changing kidney function. According to the NKDEP, eGFR >89 is normal, 60-89 shows mild impairment, 30-59 shows moderate impairment, 15-29 shows severe impairment and <15 is ESRD.      GFR calc non Af Amer  Date Value Ref Range Status  06/11/2018 42 (L) >59 mL/min/1.73 Final         Passed - HBA1C is between 0 and 7.9 and within 180 days    Hemoglobin A1C  Date Value Ref Range Status  06/11/2018 6.8 (A) 4.0 - 5.6 % Final   Hgb A1c MFr Bld  Date Value Ref Range Status  11/11/2017 8.1 (H) 4.8 - 5.6 % Final    Comment:             Prediabetes: 5.7 - 6.4          Diabetes: >6.4          Glycemic  control for adults with diabetes: <7.0          Passed - Valid encounter within last 6 months    Recent Outpatient Visits          1 month ago Type 2 diabetes mellitus with microalbuminuria, with long-term current use of insulin [facility])   [facility] at [facility], Ranell Patrick, MD   4 months ago Intractable hiccups   Primary Care at [facility], Fenton Malling, MD   5 months ago Type 2 diabetes mellitus with microalbuminuria, with long-term current use of insulin [facility])   [facility] at [facility], Ranell Patrick, MD   6 months ago Preoperative clearance   [facility] at [facility], Ranell Patrick, MD   8 months ago Chronic kidney disease, unspecified CKD stage   [facility] at [facility], Ranell Patrick, MD           . metoprolol tartrate (LOPRESSOR) 50 MG tablet [Pharmacy Med Name: METOPROLOL TARTRATE 50 MG Tablet] 180 tablet 1  Sig: TAKE 1 TABLET TWICE DAILY     Cardiovascular:  Beta Blockers Failed - 07/28/2018  1:37 PM      Failed - Last BP in normal range    BP Readings from Last 1 Encounters:  06/11/18 (!) 160/68         Passed - Last Heart Rate in normal range    Pulse Readings from Last 1 Encounters:  06/11/18 83         Passed - Valid encounter within last 6 months    Recent Outpatient Visits          1 month ago Type 2 diabetes mellitus with microalbuminuria, with long-term current use of insulin ([place])   [facility] at [facility], Ranell Patrick, MD   4 months ago Intractable hiccups   Primary Care at [facility], Fenton Malling, MD   5 months ago Type 2 diabetes mellitus with microalbuminuria, with long-term current use of insulin [facility])   [facility] at [facility], Ranell Patrick, MD   6 months ago Preoperative clearance   [facility] at [facility], Ranell Patrick, MD   8 months ago Chronic kidney disease, unspecified CKD stage   Primary [facility] at [facility], Ranell Patrick, MD           . lisinopril-hydrochlorothiazide (ZESTORETIC) 20-25 MG tablet [Pharmacy Med Name:  LISINOPRIL/HYDROCHLOROTHIAZIDE 20-25 MG Tablet] 90 tablet     Sig: TAKE 1 TABLET EVERY DAY     Cardiovascular:  ACEI + Diuretic Combos Failed - 07/28/2018  1:37 PM      Failed - Cr in normal range and within 180 days    Creat  Date Value Ref Range Status  01/30/2016 1.27 (H) 0.70 - 1.18 mg/dL Final    Comment:      For patients > or = 83 years of age: The upper reference limit for Creatinine is approximately 13% higher for people identified as African-American.      Creatinine, Ser  Date Value Ref Range Status  06/11/2018 1.53 (H) 0.76 - 1.27 mg/dL Final         Failed - Last BP in normal range    BP Readings from Last 1 Encounters:  06/11/18 (!) 160/68         Passed - Na in normal range and within 180 days    Sodium  Date Value Ref Range Status  06/11/2018 139 134 - 144 mmol/L Final         Passed - K in normal range and within 180 days    Potassium  Date Value Ref Range Status  06/11/2018 4.4 3.5 - 5.2 mmol/L Final         Passed - Ca in normal range and within 180 days    Calcium  Date Value Ref Range Status  06/11/2018 9.2 8.6 - 10.2 mg/dL Final         Passed - Patient is not pregnant      Passed - Valid encounter within last 6 months    Recent Outpatient Visits          1 month ago Type 2 diabetes mellitus with microalbuminuria, with long-term current use of insulin [facility])   [facility] at [facility], Ranell Patrick, MD   4 months ago Intractable hiccups   Primary Care at [facility], Fenton Malling, MD   5 months ago Type 2 diabetes mellitus with microalbuminuria, with long-term current use of insulin [facility])   [facility] at [facility],  Ranell Patrick, MD   6 months ago Preoperative clearance   [facility] at [facility], Ranell Patrick, MD   8 months ago Chronic kidney disease, unspecified CKD stage   [facility] at [facility], Ranell Patrick, MD           . atorvastatin (LIPITOR) 10 MG tablet [Pharmacy Med Name: ATORVASTATIN CALCIUM 10 MG Tablet] 90  tablet 1    Sig: TAKE 1 TABLET (10 MG TOTAL) BY MOUTH DAILY.     Cardiovascular:  Antilipid - Statins Failed - 07/28/2018  1:37 PM      Failed - HDL in normal range and within 360 days    HDL  Date Value Ref Range Status  02/13/2018 39 (L) >39 mg/dL Final         Failed - Triglycerides in normal range and within 360 days    Triglycerides  Date Value Ref Range Status  02/13/2018 181 (H) 0 - 149 mg/dL Final         Passed - Total Cholesterol in normal range and within 360 days    Cholesterol, Total  Date Value Ref Range Status  02/13/2018 137 100 - 199 mg/dL Final         Passed - LDL in normal range and within 360 days    LDL Calculated  Date Value Ref Range Status  02/13/2018 62 0 - 99 mg/dL Final         Passed - Patient is not pregnant      Passed - Valid encounter within last 12 months    Recent Outpatient Visits          1 month ago Type 2 diabetes mellitus with microalbuminuria, with long-term current use of insulin ([place])   [facility] at [facility], Ranell Patrick, MD   4 months ago Intractable hiccups   Primary Care at [facility], Fenton Malling, MD   5 months ago Type 2 diabetes mellitus with microalbuminuria, with long-term current use of insulin [facility])   [facility] at [facility], Ranell Patrick, MD   6 months ago Preoperative clearance   [facility] at [facility], Ranell Patrick, MD   8 months ago Chronic kidney disease, unspecified CKD stage   [facility] at [facility], Ranell Patrick, MD

## 2018-08-28 DIAGNOSIS — N529 Male erectile dysfunction, unspecified: Secondary | ICD-10-CM | POA: Diagnosis not present

## 2018-08-28 DIAGNOSIS — C61 Malignant neoplasm of prostate: Secondary | ICD-10-CM | POA: Diagnosis not present

## 2018-08-28 DIAGNOSIS — N401 Enlarged prostate with lower urinary tract symptoms: Secondary | ICD-10-CM | POA: Diagnosis not present

## 2018-08-28 DIAGNOSIS — N138 Other obstructive and reflux uropathy: Secondary | ICD-10-CM | POA: Diagnosis not present

## 2018-08-28 DIAGNOSIS — E119 Type 2 diabetes mellitus without complications: Secondary | ICD-10-CM | POA: Diagnosis not present

## 2018-08-28 DIAGNOSIS — C679 Malignant neoplasm of bladder, unspecified: Secondary | ICD-10-CM | POA: Diagnosis not present

## 2018-10-13 ENCOUNTER — Telehealth: Payer: Self-pay | Admitting: Family Medicine

## 2018-10-13 ENCOUNTER — Other Ambulatory Visit: Payer: Self-pay | Admitting: Family Medicine

## 2018-10-13 DIAGNOSIS — E1129 Type 2 diabetes mellitus with other diabetic kidney complication: Secondary | ICD-10-CM

## 2018-10-13 DIAGNOSIS — I10 Essential (primary) hypertension: Secondary | ICD-10-CM

## 2018-10-13 DIAGNOSIS — Z794 Long term (current) use of insulin: Secondary | ICD-10-CM

## 2018-10-13 NOTE — Telephone Encounter (Signed)
I have paperwork for preoperative risk evaluation for left total knee surgery.  We discussed this a few months ago, and had cardiology clearance at that time.  Is he still planning on surgery?  If so may need updated visit for lab work and surgical clearance.  Can schedule at his convenience if needed.

## 2018-10-15 ENCOUNTER — Other Ambulatory Visit: Payer: Self-pay | Admitting: Family Medicine

## 2018-10-15 DIAGNOSIS — E785 Hyperlipidemia, unspecified: Secondary | ICD-10-CM

## 2018-10-15 IMAGING — DX DG LUMBAR SPINE COMPLETE 4+V
5 series · 5 of 5 positions shown · non-contrast
Comparison: Coronal and sagittal images through the lumbar spine
from an abdominal and pelvic CT scan dated December 12, 2015.

CLINICAL DATA: Chronic low back pain without sciatic symptoms.

EXAM:
LUMBAR SPINE - COMPLETE 4+ VIEW

[l-spine ap]
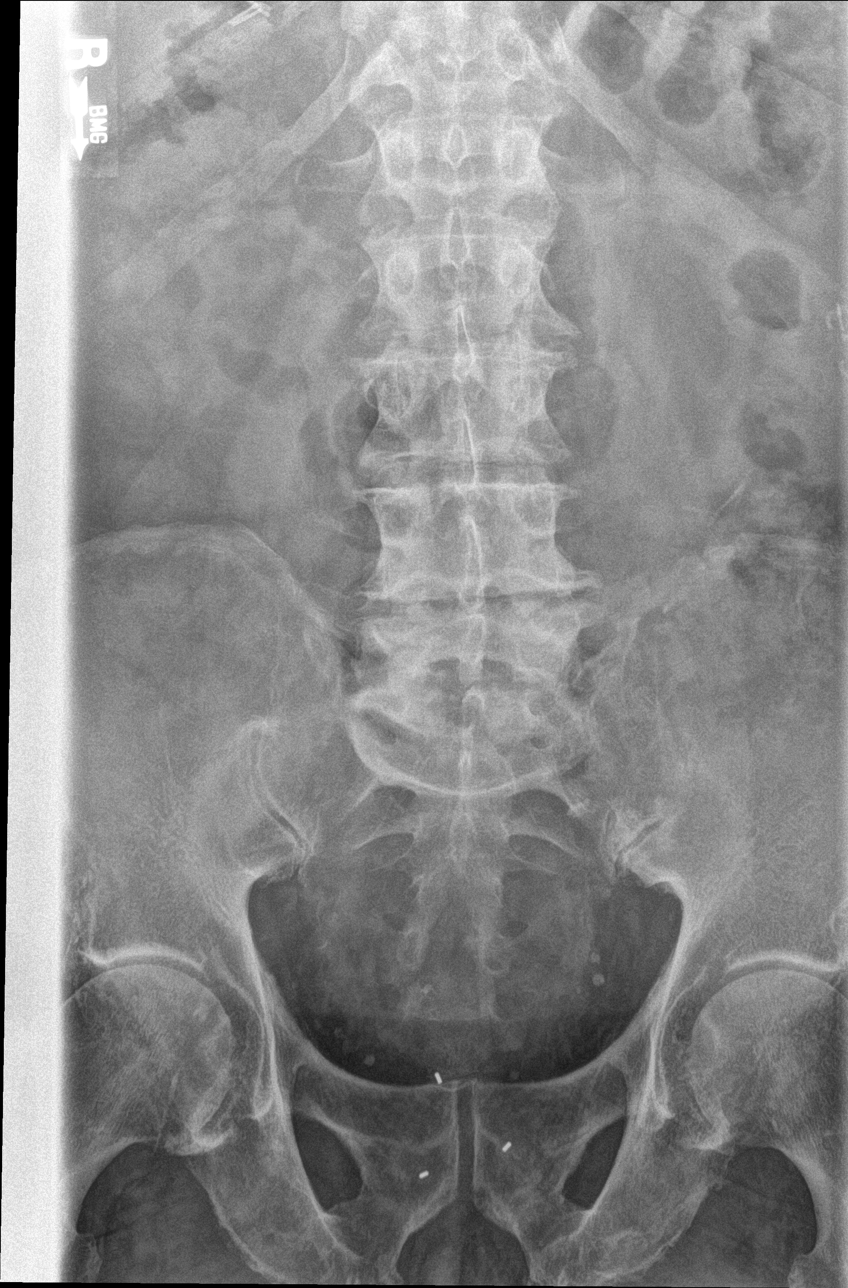

[l-spine obl (1 of 2)]
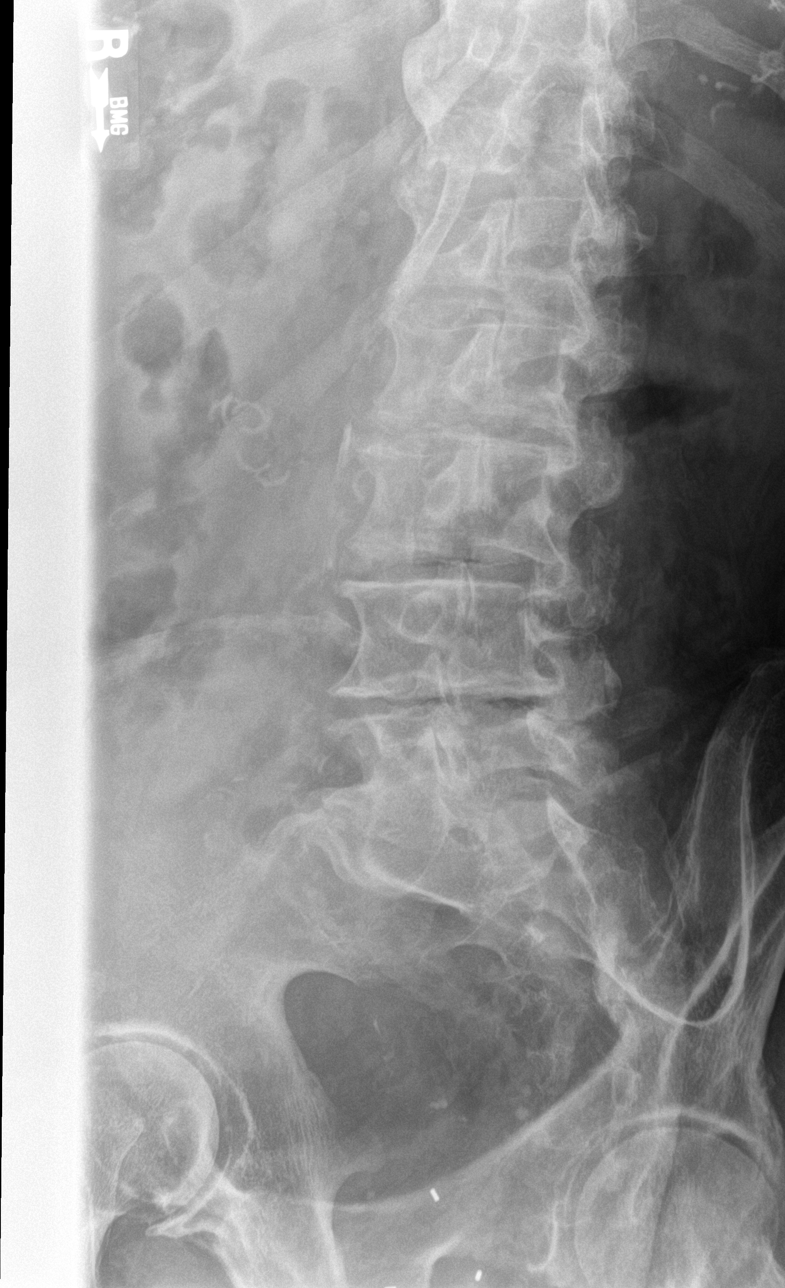

[l-spine obl (2 of 2)]
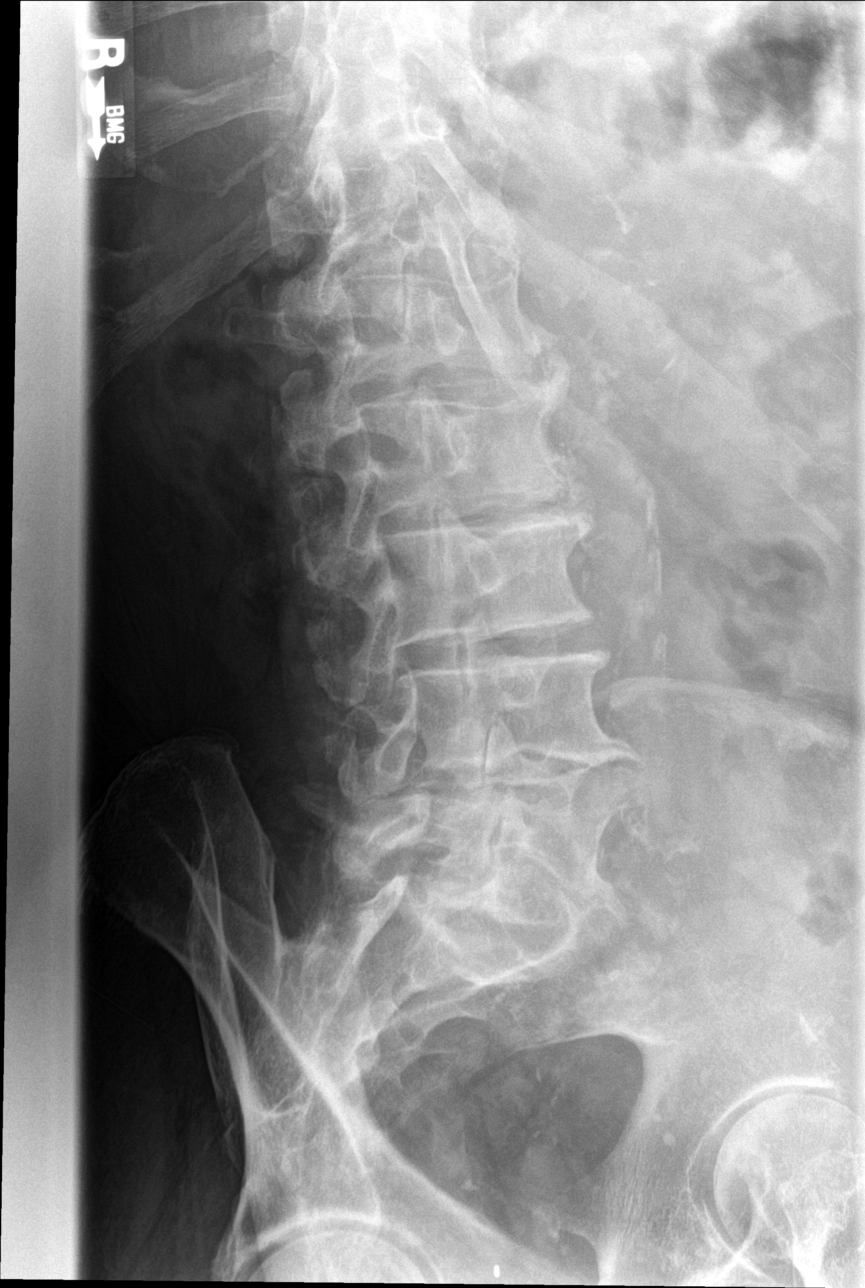

[l-spine lat]
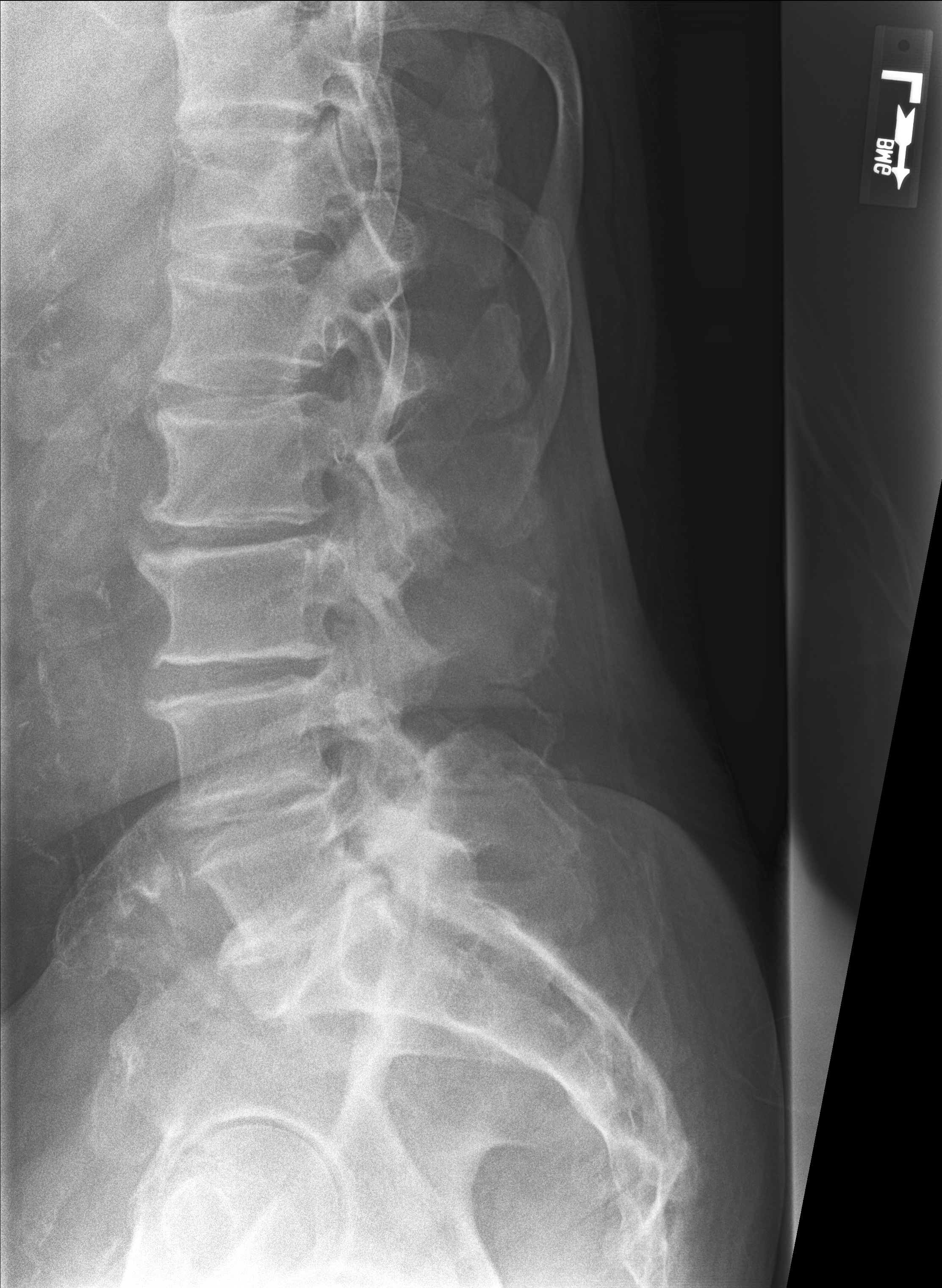

[l-spine l5-s1]
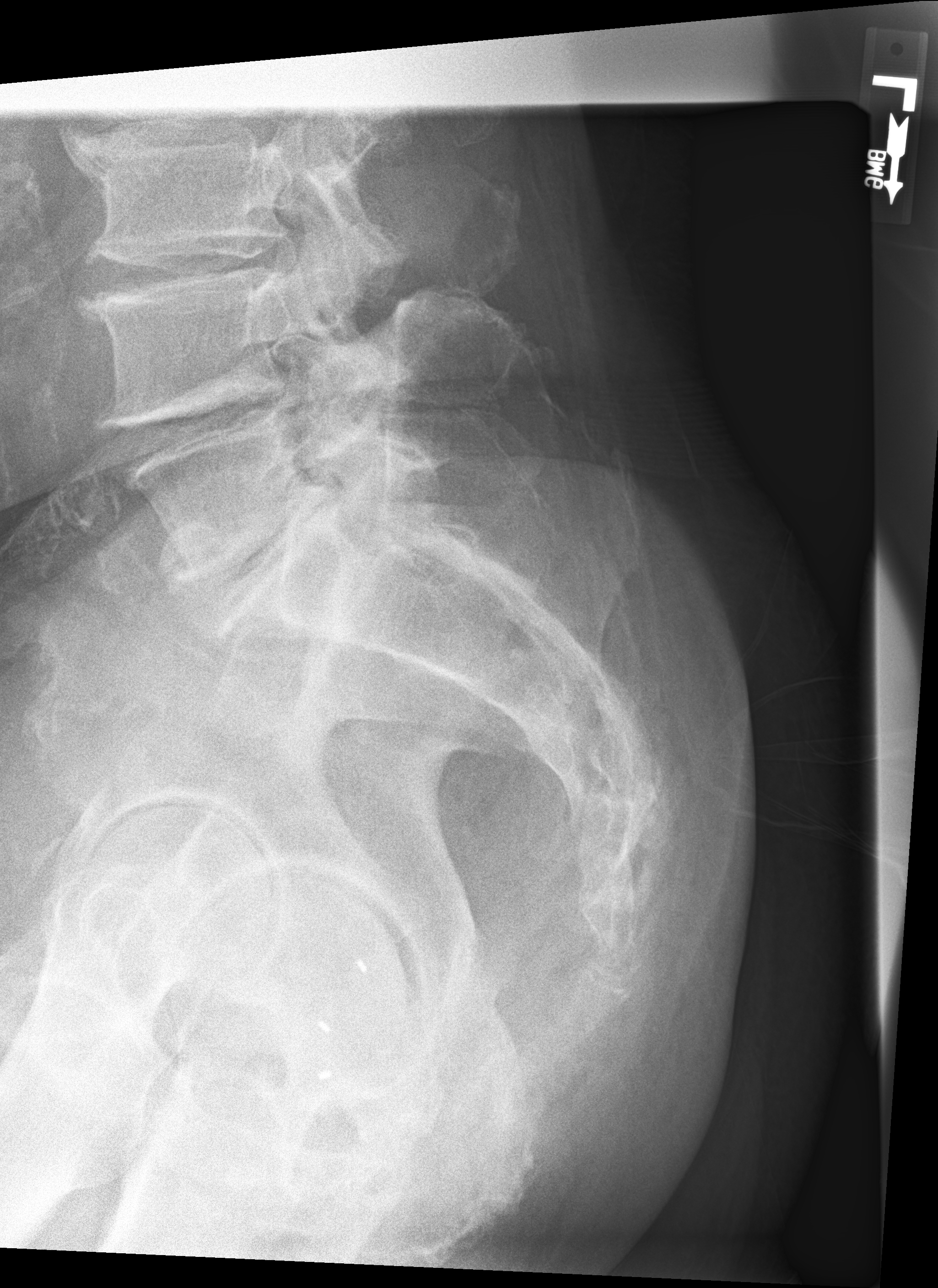

[5 of 5 positions shown; findings below may reference images not displayed]

FINDINGS: The lumbar vertebral bodies are preserved in height. There is
moderate disc space narrowing at L4-5 and L5-S1 with milder
narrowing at the other lumbar levels. There is no spondylolisthesis.
There is facet joint hypertrophy at L4-5 and at L5-S1. But the
pedicles and transverse processes are intact. The observed portions
of the sacrum are normal. There is dense calcification in the wall
of the abdominal aorta.
IMPRESSION: Multilevel degenerative disc disease greatest at L4-5 and L5-S1 with
facet joint hypertrophy at these levels as well. No compression
fracture or spondylolisthesis.

Abdominal aortic atherosclerosis.

## 2018-10-15 NOTE — Telephone Encounter (Signed)
Forwarding medication refills to PCP for review.

## 2018-10-15 NOTE — Telephone Encounter (Signed)
He is still planning on the surgery. I have scheduled him for Monday 10/19/2018

## 2018-10-15 NOTE — Telephone Encounter (Signed)
Seen few months ago for elevated blood pressure, not sure about his actual regimen based on refill request.  Please call patient and have him review exact BP medications he is taking at home, specifically what dose of amlodipine, and is he taking both lisinopril 20 mg AND combination lisinopril HCTZ combo pill?  Can enter refills once more information obtained. Thanks.

## 2018-10-19 ENCOUNTER — Ambulatory Visit (INDEPENDENT_AMBULATORY_CARE_PROVIDER_SITE_OTHER): Payer: Medicare HMO | Admitting: Family Medicine

## 2018-10-19 ENCOUNTER — Encounter: Payer: Self-pay | Admitting: Family Medicine

## 2018-10-19 ENCOUNTER — Other Ambulatory Visit: Payer: Self-pay

## 2018-10-19 ENCOUNTER — Ambulatory Visit (INDEPENDENT_AMBULATORY_CARE_PROVIDER_SITE_OTHER): Payer: Medicare HMO

## 2018-10-19 VITALS — BP 140/68 | HR 74 | Temp 98.6°F | Resp 16 | Wt 230.0 lb

## 2018-10-19 DIAGNOSIS — I10 Essential (primary) hypertension: Secondary | ICD-10-CM

## 2018-10-19 DIAGNOSIS — E1129 Type 2 diabetes mellitus with other diabetic kidney complication: Secondary | ICD-10-CM

## 2018-10-19 DIAGNOSIS — Z01818 Encounter for other preprocedural examination: Secondary | ICD-10-CM | POA: Diagnosis not present

## 2018-10-19 DIAGNOSIS — E785 Hyperlipidemia, unspecified: Secondary | ICD-10-CM

## 2018-10-19 DIAGNOSIS — Z130 Encounter for screening for diseases of the blood and blood-forming organs and certain disorders involving the immune mechanism: Secondary | ICD-10-CM

## 2018-10-19 DIAGNOSIS — R809 Proteinuria, unspecified: Secondary | ICD-10-CM

## 2018-10-19 DIAGNOSIS — Z008 Encounter for other general examination: Secondary | ICD-10-CM | POA: Diagnosis not present

## 2018-10-19 DIAGNOSIS — Z794 Long term (current) use of insulin: Secondary | ICD-10-CM

## 2018-10-19 DIAGNOSIS — Z0279 Encounter for issue of other medical certificate: Secondary | ICD-10-CM | POA: Diagnosis not present

## 2018-10-19 LAB — GLUCOSE, POCT (MANUAL RESULT ENTRY): POC Glucose: 120 mg/dl — AB (ref 70–99)

## 2018-10-19 MED ORDER — AMLODIPINE BESYLATE 10 MG PO TABS: 10.0000 mg | ORAL_TABLET | Freq: Every day | ORAL | 1 refills | Status: DC

## 2018-10-19 MED ORDER — LISINOPRIL-HYDROCHLOROTHIAZIDE 20-25 MG PO TABS: 1.0000 | ORAL_TABLET | Freq: Every day | ORAL | 1 refills | Status: DC

## 2018-10-19 MED ORDER — LISINOPRIL 20 MG PO TABS: 20.0000 mg | ORAL_TABLET | Freq: Every day | ORAL | 1 refills | Status: DC

## 2018-10-19 MED ORDER — NOVOLIN 70/30 (70-30) 100 UNIT/ML ~~LOC~~ SUSP: SUBCUTANEOUS | 5 refills | Status: DC

## 2018-10-19 MED ORDER — METFORMIN HCL 1000 MG PO TABS: 1000.0000 mg | ORAL_TABLET | Freq: Two times a day (BID) | ORAL | 1 refills | Status: DC

## 2018-10-19 MED ORDER — METOPROLOL TARTRATE 50 MG PO TABS: 50.0000 mg | ORAL_TABLET | Freq: Two times a day (BID) | ORAL | 1 refills | Status: DC

## 2018-10-19 MED ORDER — ATORVASTATIN CALCIUM 10 MG PO TABS: 10.0000 mg | ORAL_TABLET | Freq: Every day | ORAL | 1 refills | Status: DC

## 2018-10-19 NOTE — Patient Instructions (Addendum)
  You may need to get an updated cardiac clearance from Dr. [name].  No concerns on EKG today.  I should be able to complete paperwork for your surgeon in next 2 weeks.   No change in meds for now. Make sure the med list today matches what you are taking at home and let me know if there are any differences.   Return to the clinic or go to the nearest emergency room if any of your symptoms worsen or new symptoms occur.     If you have lab work done today you will be contacted with your lab results within the next 2 weeks.  If you have not heard from [place] then please contact us. The fastest way to get your results is to register for My Chart.   IF you received an x-ray today, you will receive an invoice from [facility]. Please contact [facility] at [phone] with questions or concerns regarding your invoice.   IF you received labwork today, you will receive an invoice from [facility]. Please contact [facility] at [phone] with questions or concerns regarding your invoice.   Our billing staff will not be able to assist you with questions regarding bills from these companies.  You will be contacted with the lab results as soon as they are available. The fastest way to get your results is to activate your My Chart account. Instructions are located on the last page of this paperwork. If you have not heard from [place] regarding the results in 2 weeks, please contact this office.

## 2018-10-19 NOTE — Progress Notes (Signed)
Subjective:    Patient ID: [name], male    DOB: Sep 03, 1933, 85 y.o.   MRN: [id number]  HPI [name] is a 85 y.o. male Presents today for: Chief Complaint  Patient presents with  . Medical Clearance    Patient is here for a medical clearance, Patient states he probably would not get this surgery done yet but will talk to Dr Carlota Raspberry about this  Presents for medical clearance, but also due for routine follow-up.  Last visit in March and at that time did briefly discuss possible left total knee arthroplasty with Dr. [name].  Preop evaluation: Paperwork noted including need for EKG, CBC, BMP, albumin, A1c and urinalysis with A1c less than 7.5, hemoglobin greater than 12, albumin greater than 3 per paperwork.  Reportedly cardiology, Dr. Alvester Chou had previously provided cardiac clearance.  Did have right knee surgery with total arthroplasty in November 2019 without difficulty at that time or problems with anesthesia. Still some difficulty in R knee from last surgery. Anticipated that L knee would do better than the right.     Cardiac His cardiologist is Dr. [name]. He has a history of CAD s/p LAD stent Oct 2009, attempt at VT ablation in Feb 2010, aborted due to inappropriate substrate. Aspirin had been discontinued due to rectal bleeding. Asymptomatic with last Myoview in 2013.  Quit smoking last year.  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  History of prostate cancer, bladder cancer, erectile dysfunction: Urology Dr. [name] at [facility].  TURBT plus chemo in July 2019, tamsulosin prior - now Mybetriq 25 qd.  Saw Dr. [name] last month - 6 month follow up .   Diabetes: On insulin, complicated by CKD and microalbuminuria.  Improved control when checked in March.  Continued on metformin 1000 mg twice daily, 70/30 insulin 8 units twice daily. Advised to return in few weeks with home readings  for blood pressure at last visit in March.  Microalbumin: elevated in May 2018.  He has taking  ACE inhibitor. Optho, foot exam, pneumovax: Up-to-date except due for ophthalmology exam. On statin, on ACE inhibitor. Home readings 110-120.  No symptomatic lows - lowest 97.  Lab Results  Component Value Date   HGBA1C 6.8 (A) 06/11/2018   HGBA1C 7.5 (A) 02/13/2018   HGBA1C 8.1 (H) 11/11/2017   Lab Results  Component Value Date   MICROALBUR 83.3 05/17/2015   LDLCALC 62 02/13/2018   CREATININE 1.53 (H) 06/11/2018   Hypertension: BP Readings from Last 3 Encounters:  10/19/18 140/68  06/11/18 (!) 160/68  03/10/18 139/65   Wt Readings from Last 3 Encounters:  10/19/18 230 lb (104.3 kg)  06/11/18 223 lb 6.4 oz (101.3 kg)  03/10/18 230 lb (104.3 kg)  up 7# since last visit- exercise limited due to knee pain.  Home readings under 140 with 25'Z diastolics.   Lab Results  Component Value Date   CREATININE 1.53 (H) 06/11/2018  eGFR: 48 in March.   Telemedicine visit April 7 with Dr. Mitchel Honour.  Amlodipine increased to 10 mg/day.  No other changes. Currently on amlodipine 10 mg daily, lisinopril 20 mg with additional lisinopril HCTZ 20/25 mg, metoprolol 50 mg twice daily No missed doses. No new side effects.  Hyperlipidemia:  Lab Results  Component Value Date   CHOL 137 02/13/2018   HDL 39 (L) 02/13/2018   LDLCALC 62 02/13/2018   TRIG 181 (H) 02/13/2018   CHOLHDL 3.5 02/13/2018  Lab Results  Component Value Date   ALT 12 06/11/2018   AST 8 06/11/2018   ALKPHOS 45 06/11/2018   BILITOT 0.2 06/11/2018  Lipitor 10 mg daily.  No new myalgias. No new side effects.    Patient Active Problem List   Diagnosis Date Noted  . Osteoarthritis of right knee 03/04/2018  . Rib pain on left side 07/01/2016  . Fall 12/14/2015  . Elevated serum creatinine 12/14/2015  . Ileus ([place]) 12/14/2015  . Multiple fractures of ribs of left side 12/12/2015  . Prostate cancer ([place]) 05/26/2014  .  Blood in the urine 12/01/2013  . Cystitis, radiation 12/01/2013  . Hematuria 12/01/2013  . 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 Malignant neoplasm of prostate ([place]) 11/26/2013  . Male erectile dysfunction 11/26/2013  . Pulsatile tinnitus 11/19/2013  . Rectal bleeding 12/23/2012  . Type II or unspecified type diabetes mellitus without mention of complication, not stated as uncontrolled   . Essential hypertension, benign   . Chronic back pain   . ED (erectile dysfunction)   . Anemia   . Hyperlipidemia   . CAD, NATIVE VESSEL 05/08/2008  . VENTRICULAR TACHYCARDIA 05/08/2008  . PREMATURE VENTRICULAR CONTRACTIONS 05/08/2008   Past Medical History:  Diagnosis Date  . Allergy   . CAD (coronary artery disease)    pci to LAD 10/09; stable CAD by cath 05/31/08; myoview 04/11/11- no ishcemia, EF 67%; echo 05/15/09- EF>55%, mod calcification of the aortic valve leaflets  . Chronic back pain   . ED (erectile dysfunction)   . Essential hypertension, benign   . Hyperlipidemia   . Multiple rib fractures    left 9th, 10th, and 11th posterior rib fractures S/P fall 12/09/2015/notes 12/12/2015  . Prostate cancer ([place])    s/p  . Type II or unspecified type diabetes mellitus without mention of complication, not stated as uncontrolled   . Ventricular tachycardia ([place])    resuscitated; monitor 05/2008; attempted T ablation 2/10- aborted due to inappropriate substrate   Past Surgical History:  Procedure Laterality Date  . CARDIAC CATHETERIZATION  05/31/08   EF 55-60%, stable lesions, medical therapy  . COLECTOMY  '80's   polyps  . CORONARY ANGIOPLASTY WITH STENT PLACEMENT  01/19/08   PCI stent to mid LAD with Promus DES 27.5x28  . IR GENERIC HISTORICAL  01/03/2016   IR THORACENTESIS ASP PLEURAL SPACE W/IMG GUIDE 01/03/2016 [facility]  . KNEE ARTHROPLASTY Right 03/04/2018   Procedure: COMPUTER ASSISTED TOTAL KNEE ARTHROPLASTY;  Surgeon: Rod Can, MD;  Location: [facility];  Service: Orthopedics;   Laterality: Right;  . PTCA  01/2008   Allergies  Allergen Reactions  . Contrast Media [Iodinated Diagnostic Agents] Swelling and Other (See Comments)    Possible neck swelling after contrast for chest CT 03/2016. No hives, throat or other symptoms   Prior to Admission medications   Medication Sig Start Date End Date Taking? Authorizing Provider  amLODipine (NORVASC) 10 MG tablet Take 1 tablet (10 mg total) by mouth daily. 07/14/18  Yes Sagardia, Ines Bloomer, MD  atorvastatin (LIPITOR) 10 MG tablet TAKE 1 TABLET (10 MG TOTAL) BY MOUTH DAILY. 07/28/18  Yes [name], MD  Blood Glucose Monitoring Suppl (ACCU-CHEK AVIVA PLUS) w/Device KIT Test blood sugar 3 times daily. Dx code: E11.22 04/03/16  Yes [name], MD  ferrous sulfate 325 (65 FE) MG tablet Take 325 mg by mouth daily with breakfast.   Yes [provider]  glucose blood (ACCU-CHEK AVIVA PLUS) test strip Test blood sugar  3 times daily. Dx code: E11.22 04/03/16  Yes [name], MD  insulin NPH-regular Human (NOVOLIN 70/30) (70-30) 100 UNIT/ML injection ADMINISTER 8 UNITS UNDER THE SKIN TWICE DAILY WITH A MEAL Patient taking differently: Inject 8 Units into the skin 2 (two) times daily with a meal.  02/13/18  Yes [name], MD  Insulin Syringe-Needle U-100 (B-D INS SYR HALF-UNIT .3CC/31G) 31G X 5/16" 0.3 ML MISC Use daily at bedtime to inject insulin. Dx code: 250.00 06/21/13  Yes [name], MD  Lancets (ACCU-CHEK MULTICLIX) lancets Test blood sugar 3 times daily. Dx code: E11.22 04/03/16  Yes [name], MD  lisinopril (PRINIVIL,ZESTRIL) 20 MG tablet TAKE 1 TABLET EVERY DAY 05/22/18  Yes [name], MD  lisinopril-hydrochlorothiazide (ZESTORETIC) 20-25 MG tablet TAKE 1 TABLET EVERY DAY 07/28/18  Yes [name], MD  metFORMIN (GLUCOPHAGE) 1000 MG tablet TAKE 1 TABLET TWICE DAILY WITH MEALS 10/13/18  Yes [name], MD  metoprolol tartrate (LOPRESSOR) 50 MG tablet TAKE 1  TABLET TWICE DAILY 10/13/18  Yes [name], MD  mirabegron ER (MYRBETRIQ) 25 MG TB24 tablet Take 25 mg by mouth daily.   Yes [provider]  Multiple Vitamin (MULTIVITAMIN) tablet Take 1 tablet by mouth daily. COMPLETE SENIOR VITAMINS AND M   Yes [provider]  Omega-3 Fatty Acids (FISH OIL) 1000 MG CAPS Take 2 capsules by mouth daily.    Yes [provider]  Vitamin D, Cholecalciferol, 1000 UNITS TABS Take 1 tablet by mouth daily.    Yes [provider]   Social History   Socioeconomic History  . Marital status: Legally Separated    Spouse name: Not on file  . Number of children: 7  . Years of education: Not on file  . Highest education level: Not on file  Occupational History  . Occupation: RETIRED    Employer: RETIRED  Social Needs  . Financial resource strain: Not on file  . Food insecurity    Worry: Not on file    Inability: Not on file  . Transportation needs    Medical: Not on file    Non-medical: Not on file  Tobacco Use  . Smoking status: Former Smoker    Packs/day: 0.00    Years: 15.00    Pack years: 0.00    Types: Cigarettes    Quit date: 05/10/2017    Years since quitting: 1.4  . Smokeless tobacco: Never Used  . Tobacco comment: Patient smokes occasionally. Not everyday.  Substance and Sexual Activity  . Alcohol use: No  . Drug use: No  . Sexual activity: Never  Lifestyle  . Physical activity    Days per week: Not on file    Minutes per session: Not on file  . Stress: Not on file  Relationships  . Social Herbalist on phone: Not on file    Gets together: Not on file    Attends religious service: Not on file    Active member of club or organization: Not on file    Attends meetings of clubs or organizations: Not on file    Relationship status: Not on file  . Intimate partner violence    Fear of current or ex partner: Not on file    Emotionally abused: Not on file    Physically abused: Not on file     Forced sexual activity: Not on file  Other Topics Concern  . Not on file  Social  History Narrative   29 grandchildren. Single. Education: 12 th grade. Exercise: 3-4 times a week for 30 minutes working out and set ups.    Review of Systems  Constitutional: Negative for fatigue and unexpected weight change.  Eyes: Negative for visual disturbance.  Respiratory: Negative for cough, chest tightness and shortness of breath.   Cardiovascular: Negative for chest pain, palpitations and leg swelling.  Gastrointestinal: Negative for abdominal pain and blood in stool.  Neurological: Negative for dizziness, light-headedness and headaches.       Objective:   Physical Exam Vitals signs reviewed.  Constitutional:      Appearance: He is well-developed.  HENT:     Head: Normocephalic and atraumatic.  Eyes:     Pupils: Pupils are equal, round, and reactive to light.  Neck:     Vascular: No carotid bruit or JVD.  Cardiovascular:     Rate and Rhythm: Normal rate and regular rhythm.     Heart sounds: Normal heart sounds. No murmur.  Pulmonary:     Effort: Pulmonary effort is normal.     Breath sounds: Normal breath sounds. No rales.  Skin:    General: Skin is warm and dry.  Neurological:     Mental Status: He is alert and oriented to person, place, and time.    Vitals:   10/19/18 1327  BP: 140/68  Pulse: 74  Resp: 16  Temp: 98.6 F (37 C)  TempSrc: Oral  SpO2: 97%  Weight: 230 lb (104.3 kg)     EKG; sinus rhythm, rate 70, normal QTC 388, no acute ST/T wave changes seen.  Dg Chest 2 View  Result Date: 10/19/2018 CLINICAL DATA:  Encounter for medical clearance. EXAM: CHEST - 2 VIEW COMPARISON:  03/10/2018 FINDINGS: The cardiac silhouette is normal in size. No mediastinal or hilar masses. No evidence of adenopathy. Lungs are clear.  No pleural effusion or pneumothorax. Skeletal structures are intact. IMPRESSION: No active cardiopulmonary disease. Electronically Signed   By: Lajean Manes M.D.   On: 10/19/2018 14:23       Assessment & Plan:   [name] is a 85 y.o. male Preoperative evaluation to rule out surgical contraindication  -Plan for left total knee replacement.  Apparently has been discussed that he could expect improved outcome from left knee, as still some stiffness and difficulty with right knee from prior surgery.  Previously had cardiac clearance, not sure if that is still effective so would need to coordinate with his cardiologist.  For medical clearance, labs obtained today.  No concerning findings on EKG or chest x-ray.  Paperwork to be completed once labs returned.   Type 2 diabetes mellitus with microalbuminuria, with long-term current use of insulin (HCC) - Plan: [facility] Diabetes Foot Exam, Hemoglobin A1c, POCT glucose (manual entry), Microalbumin / creatinine urine ratio, insulin NPH-regular Human (NOVOLIN 70/30) (70-30) 100 UNIT/ML injection, metFORMIN (GLUCOPHAGE) 1000 MG tablet  -Tolerating metformin, 70/30 insulin at 8 units twice daily.  Denies any hypoglycemia.  Cautioned on signs or symptoms of hypoglycemia, especially with use of beta-blocker which may mask some of those symptoms.  Understanding expressed.  Hyperlipidemia, unspecified hyperlipidemia type - Plan: Lipid Panel, atorvastatin (LIPITOR) 10 MG tablet  -Tolerating Lipitor, continue same  Essential hypertension - Plan: Comprehensive metabolic panel, lisinopril (ZESTRIL) 20 MG tablet, lisinopril-hydrochlorothiazide (ZESTORETIC) 20-25 MG tablet, amLODipine (NORVASC) 10 MG tablet, metoprolol tartrate (LOPRESSOR) 50 MG tablet  -Borderline but stable, no med changes.  Labs pending  Screening, anemia, deficiency, iron -  Plan: CBC with Differential/Platelet   Meds ordered this encounter  Medications  . insulin NPH-regular Human (NOVOLIN 70/30) (70-30) 100 UNIT/ML injection    Sig: ADMINISTER 8 UNITS UNDER THE SKIN TWICE DAILY WITH A MEAL    Dispense:  15 mL    Refill:  5  . lisinopril  (ZESTRIL) 20 MG tablet    Sig: Take 1 tablet (20 mg total) by mouth daily.    Dispense:  90 tablet    Refill:  1  . lisinopril-hydrochlorothiazide (ZESTORETIC) 20-25 MG tablet    Sig: Take 1 tablet by mouth daily.    Dispense:  90 tablet    Refill:  1  . metFORMIN (GLUCOPHAGE) 1000 MG tablet    Sig: Take 1 tablet (1,000 mg total) by mouth 2 (two) times daily with a meal.    Dispense:  180 tablet    Refill:  1  . amLODipine (NORVASC) 10 MG tablet    Sig: Take 1 tablet (10 mg total) by mouth daily.    Dispense:  90 tablet    Refill:  1  . atorvastatin (LIPITOR) 10 MG tablet    Sig: Take 1 tablet (10 mg total) by mouth daily.    Dispense:  90 tablet    Refill:  1  . metoprolol tartrate (LOPRESSOR) 50 MG tablet    Sig: Take 1 tablet (50 mg total) by mouth 2 (two) times daily.    Dispense:  180 tablet    Refill:  1   Patient Instructions    You may need to get an updated cardiac clearance from Dr. [name].  No concerns on EKG today.  I should be able to complete paperwork for your surgeon in next 2 weeks.   No change in meds for now. Make sure the med list today matches what you are taking at home and let me know if there are any differences.   Return to the clinic or go to the nearest emergency room if any of your symptoms worsen or new symptoms occur.     If you have lab work done today you will be contacted with your lab results within the next 2 weeks.  If you have not heard from [place] then please contact us. The fastest way to get your results is to register for My Chart.   IF you received an x-ray today, you will receive an invoice from [facility]. Please contact [facility] at [phone] with questions or concerns regarding your invoice.   IF you received labwork today, you will receive an invoice from [place]. Please contact [facility] at [phone] with questions or concerns regarding your invoice.   Our billing staff will not be able to assist you  with questions regarding bills from these companies.  You will be contacted with the lab results as soon as they are available. The fastest way to get your results is to activate your My Chart account. Instructions are located on the last page of this paperwork. If you have not heard from [place] regarding the results in 2 weeks, please contact this office.       Signed,   [name], MD [facility] [place].  10/20/18 10:49 AM

## 2018-10-20 ENCOUNTER — Encounter: Payer: Self-pay | Admitting: Family Medicine

## 2018-10-20 DIAGNOSIS — E1129 Type 2 diabetes mellitus with other diabetic kidney complication: Secondary | ICD-10-CM | POA: Diagnosis not present

## 2018-10-20 DIAGNOSIS — Z794 Long term (current) use of insulin: Secondary | ICD-10-CM | POA: Diagnosis not present

## 2018-10-20 DIAGNOSIS — R809 Proteinuria, unspecified: Secondary | ICD-10-CM | POA: Diagnosis not present

## 2018-10-20 LAB — CBC WITH DIFFERENTIAL/PLATELET
Basophils Absolute: 0 10*3/uL (ref 0.0–0.2)
Basos: 0 %
EOS (ABSOLUTE): 0.2 10*3/uL (ref 0.0–0.4)
Eos: 3 %
Hematocrit: 35 % — ABNORMAL LOW (ref 37.5–51.0)
Hemoglobin: 11.2 g/dL — ABNORMAL LOW (ref 13.0–17.7)
Immature Grans (Abs): 0 10*3/uL (ref 0.0–0.1)
Immature Granulocytes: 0 %
Lymphocytes Absolute: 1.7 10*3/uL (ref 0.7–3.1)
Lymphs: 25 %
MCH: 24.6 pg — ABNORMAL LOW (ref 26.6–33.0)
MCHC: 32 g/dL (ref 31.5–35.7)
MCV: 77 fL — ABNORMAL LOW (ref 79–97)
Monocytes Absolute: 0.7 10*3/uL (ref 0.1–0.9)
Monocytes: 11 %
Neutrophils Absolute: 4.1 10*3/uL (ref 1.4–7.0)
Neutrophils: 61 %
Platelets: 247 10*3/uL (ref 150–450)
RBC: 4.55 x10E6/uL (ref 4.14–5.80)
RDW: 17.7 % — ABNORMAL HIGH (ref 11.6–15.4)
WBC: 6.7 10*3/uL (ref 3.4–10.8)

## 2018-10-20 LAB — LIPID PANEL
Chol/HDL Ratio: 3 ratio (ref 0.0–5.0)
Cholesterol, Total: 125 mg/dL (ref 100–199)
HDL: 41 mg/dL (ref >39)
LDL Calculated: 57 mg/dL (ref 0–99)
Triglycerides: 133 mg/dL (ref 0–149)
VLDL Cholesterol Cal: 27 mg/dL (ref 5–40)

## 2018-10-20 LAB — COMPREHENSIVE METABOLIC PANEL
ALT: 9 IU/L (ref 0–44)
AST: 11 IU/L (ref 0–40)
Albumin/Globulin Ratio: 1.6 (ref 1.2–2.2)
Albumin: 4.2 g/dL (ref 3.6–4.6)
Alkaline Phosphatase: 43 IU/L (ref 39–117)
BUN/Creatinine Ratio: 15 (ref 10–24)
BUN: 26 mg/dL (ref 8–27)
Bilirubin Total: 0.2 mg/dL (ref 0.0–1.2)
CO2: 17 mmol/L — ABNORMAL LOW (ref 20–29)
Calcium: 9.5 mg/dL (ref 8.6–10.2)
Chloride: 103 mmol/L (ref 96–106)
Creatinine, Ser: 1.76 mg/dL — ABNORMAL HIGH (ref 0.76–1.27)
GFR calc Af Amer: 41 mL/min/{1.73_m2} — ABNORMAL LOW (ref >59)
GFR calc non Af Amer: 35 mL/min/{1.73_m2} — ABNORMAL LOW (ref >59)
Globulin, Total: 2.6 g/dL (ref 1.5–4.5)
Glucose: 112 mg/dL — ABNORMAL HIGH (ref 65–99)
Potassium: 4.5 mmol/L (ref 3.5–5.2)
Sodium: 137 mmol/L (ref 134–144)
Total Protein: 6.8 g/dL (ref 6.0–8.5)

## 2018-10-20 LAB — HEMOGLOBIN A1C
Est. average glucose Bld gHb Est-mCnc: 154 mg/dL
Hgb A1c MFr Bld: 7 % — ABNORMAL HIGH (ref 4.8–5.6)

## 2018-10-21 LAB — MICROALBUMIN / CREATININE URINE RATIO
Creatinine, Urine: 144.1 mg/dL
Microalb/Creat Ratio: 345 mg/g creat — ABNORMAL HIGH (ref 0–29)
Microalbumin, Urine: 497.3 ug/mL

## 2018-10-28 NOTE — Telephone Encounter (Addendum)
Patient's daughter, Oswaldo Done [phone] or [phone] stated she is the one who keeps up with the patient's medication and she is his POA.  (She will be faxing the POA paperwork.) She wanted the provider to know that the patient is taking both lisinopril/hctz and lisinopril.  Please advise as to what you want him to take.

## 2018-10-28 NOTE — Telephone Encounter (Signed)
Left a msg for patient to return call to verify which medication he is taking. Need to know if he is taking both lisinopril/hctz or just lisinopril or both medication. Need to only be on one so we need to know which one is is taking.

## 2018-10-28 NOTE — Telephone Encounter (Signed)
Dr. Carlota Raspberry The pharmacy sent a msg for this patient wanting to comfirm that this patient need to be on both lisinopril. one lisinopril/ hctz and one reg lisinopril. I looked at the last visit and both is prescribed at the same time. Just need to confirm

## 2018-10-28 NOTE — Telephone Encounter (Signed)
Left patient another msg to give me a call back to let me know which lisinopril he is taking

## 2018-10-28 NOTE — Telephone Encounter (Signed)
Please call patient and verify.  I do see both listings in the past, but I think he may just need to be on the lisinopril HCTZ combo pill alone.-Verify exactly what he is taking at home as meds were not brought into the last visit.  Thanks.

## 2018-10-29 ENCOUNTER — Telehealth: Payer: Self-pay | Admitting: Family Medicine

## 2018-10-29 NOTE — Telephone Encounter (Signed)
Patient's daughter is returning a call to [facility] about her dads medication. She also would like to know if his [place] forms were received via fax. She would like a call back please.

## 2018-10-30 NOTE — Telephone Encounter (Signed)
[name] per you office nots patient is on both lisinopril 20 mg and lisinopril/hctz 20-25mg . Patient daughter states she is also sure patient is suppose to be on both med. Daughter think it was on both cause the lisinopril did not come in the high dose he needed.

## 2018-10-30 NOTE — Telephone Encounter (Signed)
Pt s daughter called back (nicole Burkes ) returning [place] call she no idea which dosage that her Father is taking . Please call her back at [phone]

## 2018-10-30 NOTE — Telephone Encounter (Signed)
If taking both, and was doing so at last visit when BP controlled, then can continue same regimen.  Refills placed.

## 2018-11-02 NOTE — Telephone Encounter (Signed)
Ordered both on 7/13 - ok to fill as ordered. Thanks.

## 2018-11-04 ENCOUNTER — Encounter: Payer: Self-pay | Admitting: Emergency Medicine

## 2018-11-04 ENCOUNTER — Other Ambulatory Visit: Payer: Self-pay

## 2018-11-04 ENCOUNTER — Ambulatory Visit (INDEPENDENT_AMBULATORY_CARE_PROVIDER_SITE_OTHER): Payer: Medicare HMO | Admitting: Emergency Medicine

## 2018-11-04 VITALS — BP 156/73 | HR 67 | Temp 98.7°F | Resp 16 | Wt 228.2 lb

## 2018-11-04 DIAGNOSIS — M7918 Myalgia, other site: Secondary | ICD-10-CM | POA: Diagnosis not present

## 2018-11-04 DIAGNOSIS — M545 Low back pain, unspecified: Secondary | ICD-10-CM

## 2018-11-04 MED ORDER — HYDROCODONE-ACETAMINOPHEN 5-325 MG PO TABS: 1.0000 | ORAL_TABLET | Freq: Four times a day (QID) | ORAL | 0 refills | Status: DC | PRN

## 2018-11-04 NOTE — Patient Instructions (Addendum)
   If you have lab work done today you will be contacted with your lab results within the next 2 weeks.  If you have not heard from us then please contact us. The fastest way to get your results is to register for My Chart.   IF you received an x-ray today, you will receive an invoice from [facility]. Please contact [facility] at [phone] with questions or concerns regarding your invoice.   IF you received labwork today, you will receive an invoice from [facility]. Please contact [facility] at [phone] with questions or concerns regarding your invoice.   Our billing staff will not be able to assist you with questions regarding bills from these companies.  You will be contacted with the lab results as soon as they are available. The fastest way to get your results is to activate your My Chart account. Instructions are located on the last page of this paperwork. If you have not heard from us regarding the results in 2 weeks, please contact this office.     Acute Back Pain, Adult Acute back pain is sudden and usually short-lived. It is often caused by an injury to the muscles and tissues in the back. The injury may result from:  A muscle or ligament getting overstretched or torn (strained). Ligaments are tissues that connect bones to each other. Lifting something improperly can cause a back strain.  Wear and tear (degeneration) of the spinal disks. Spinal disks are circular tissue that provides cushioning between the bones of the spine (vertebrae).  Twisting motions, such as while playing sports or doing yard work.  A hit to the back.  Arthritis. You may have a physical exam, lab tests, and imaging tests to find the cause of your pain. Acute back pain usually goes away with rest and home care. Follow these instructions at home: Managing pain, stiffness, and swelling  Take over-the-counter and prescription medicines only as told by your health care  provider.  Your health care provider may recommend applying ice during the first 24-48 hours after your pain starts. To do this: ? Put ice in a plastic bag. ? Place a towel between your skin and the bag. ? Leave the ice on for 20 minutes, 2-3 times a day.  If directed, apply heat to the affected area as often as told by your health care provider. Use the heat source that your health care provider recommends, such as a moist heat pack or a heating pad. ? Place a towel between your skin and the heat source. ? Leave the heat on for 20-30 minutes. ? Remove the heat if your skin turns bright red. This is especially important if you are unable to feel pain, heat, or cold. You have a greater risk of getting burned. Activity   Do not stay in bed. Staying in bed for more than 1-2 days can delay your recovery.  Sit up and stand up straight. Avoid leaning forward when you sit, or hunching over when you stand. ? If you work at a desk, sit close to it so you do not need to lean over. Keep your chin tucked in. Keep your neck drawn back, and keep your elbows bent at a right angle. Your arms should look like the letter "L." ? Sit high and close to the steering wheel when you drive. Add lower back (lumbar) support to your car seat, if needed.  Take short walks on even surfaces as soon as you are able. Try   to increase the length of time you walk each day.  Do not sit, drive, or stand in one place for more than 30 minutes at a time. Sitting or standing for long periods of time can put stress on your back.  Do not drive or use heavy machinery while taking prescription pain medicine.  Use proper lifting techniques. When you bend and lift, use positions that put less stress on your back: ? [place] your knees. ? Keep the load close to your body. ? Avoid twisting.  Exercise regularly as told by your health care provider. Exercising helps your back heal faster and helps prevent back injuries by keeping muscles  strong and flexible.  Work with a physical therapist to make a safe exercise program, as recommended by your health care provider. Do any exercises as told by your physical therapist. Lifestyle  Maintain a healthy weight. Extra weight puts stress on your back and makes it difficult to have good posture.  Avoid activities or situations that make you feel anxious or stressed. Stress and anxiety increase muscle tension and can make back pain worse. Learn ways to manage anxiety and stress, such as through exercise. General instructions  Sleep on a firm mattress in a comfortable position. Try lying on your side with your knees slightly bent. If you lie on your back, put a pillow under your knees.  Follow your treatment plan as told by your health care provider. This may include: ? Cognitive or behavioral therapy. ? Acupuncture or massage therapy. ? Meditation or yoga. Contact a health care provider if:  You have pain that is not relieved with rest or medicine.  You have increasing pain going down into your legs or buttocks.  Your pain does not improve after 2 weeks.  You have pain at night.  You lose weight without trying.  You have a fever or chills. Get help right away if:  You develop new bowel or bladder control problems.  You have unusual weakness or numbness in your arms or legs.  You develop nausea or vomiting.  You develop abdominal pain.  You feel faint. Summary  Acute back pain is sudden and usually short-lived.  Use proper lifting techniques. When you bend and lift, use positions that put less stress on your back.  Take over-the-counter and prescription medicines and apply heat or ice as directed by your health care provider. This information is not intended to replace advice given to you by your health care provider. Make sure you discuss any questions you have with your health care provider. Document Released: 03/25/2005 Document Revised: 07/14/2018 Document  Reviewed: 11/06/2016 Elsevier Patient Education  2020 [employer].

## 2018-11-04 NOTE — Progress Notes (Signed)
[name] 85 y.o.   Chief Complaint  Patient presents with  . Flank Pain    Left x 3 days    HISTORY OF PRESENT ILLNESS: This is a 85 y.o. male complaining of pain to left lower lumbar area for the past 3 days.  Sharp steady pain with no radiation and no associated symptoms.  Similar to pain he has had before.  No changes in clinical presentation.  No new symptoms.  Denies fever or chills.  Denies bowel or bladder symptoms.  Denies any recent injuries.  Able to eat and drink.  Denies nausea or vomiting.  No other significant symptoms.  HPI   Prior to Admission medications   Medication Sig Start Date End Date Taking? Authorizing Provider  amLODipine (NORVASC) 10 MG tablet Take 1 tablet (10 mg total) by mouth daily. 10/19/18   [name], MD  atorvastatin (LIPITOR) 10 MG tablet Take 1 tablet (10 mg total) by mouth daily. 10/19/18   [name], MD  Blood Glucose Monitoring Suppl (ACCU-CHEK AVIVA PLUS) w/Device KIT Test blood sugar 3 times daily. Dx code: E11.22 04/03/16   [name], MD  ferrous sulfate 325 (65 FE) MG tablet Take 325 mg by mouth daily with breakfast.    [provider]  glucose blood (ACCU-CHEK AVIVA PLUS) test strip Test blood sugar 3 times daily. Dx code: E11.22 04/03/16   [name], MD  insulin NPH-regular Human (NOVOLIN 70/30) (70-30) 100 UNIT/ML injection ADMINISTER 8 UNITS UNDER THE SKIN TWICE DAILY WITH A MEAL 10/19/18   [name], MD  Insulin Syringe-Needle U-100 (B-D INS SYR HALF-UNIT .3CC/31G) 31G X 5/16" 0.3 ML MISC Use daily at bedtime to inject insulin. Dx code: 250.00 06/21/13   [name], MD  Lancets (ACCU-CHEK MULTICLIX) lancets Test blood sugar 3 times daily. Dx code: E11.22 04/03/16   [name], MD  lisinopril (ZESTRIL) 20 MG tablet Take 1 tablet (20 mg total) by mouth daily. 10/19/18   [name], MD  lisinopril-hydrochlorothiazide (ZESTORETIC) 20-25 MG tablet Take 1 tablet by mouth  daily. 10/19/18   [name], MD  metFORMIN (GLUCOPHAGE) 1000 MG tablet Take 1 tablet (1,000 mg total) by mouth 2 (two) times daily with a meal. 10/19/18   [name], MD  metoprolol tartrate (LOPRESSOR) 50 MG tablet Take 1 tablet (50 mg total) by mouth 2 (two) times daily. 10/19/18   [name], MD  mirabegron ER (MYRBETRIQ) 25 MG TB24 tablet Take 25 mg by mouth daily.    [provider]  Multiple Vitamin (MULTIVITAMIN) tablet Take 1 tablet by mouth daily. COMPLETE SENIOR VITAMINS AND M    [provider]  Omega-3 Fatty Acids (FISH OIL) 1000 MG CAPS Take 2 capsules by mouth daily.     [provider]  Vitamin D, Cholecalciferol, 1000 UNITS TABS Take 1 tablet by mouth daily.     [provider]    Allergies  Allergen Reactions  . Contrast Media [Iodinated Diagnostic Agents] Swelling and Other (See Comments)    Possible neck swelling after contrast for chest CT 03/2016. No hives, throat or other symptoms    Patient Active Problem List   Diagnosis Date Noted  . Osteoarthritis of right knee 03/04/2018  . Rib pain on left side 07/01/2016  . Fall 12/14/2015  . Elevated serum creatinine 12/14/2015  . Ileus ([place]) 12/14/2015  . Multiple fractures of ribs of left side 12/12/2015  . Prostate cancer (  [place]) 05/26/2014  . Blood in the urine 12/01/2013  . Cystitis, radiation 12/01/2013  . Hematuria 12/01/2013  . 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 Malignant neoplasm of prostate ([place]) 11/26/2013  . Male erectile dysfunction 11/26/2013  . Pulsatile tinnitus 11/19/2013  . Rectal bleeding 12/23/2012  . Type II or unspecified type diabetes mellitus without mention of complication, not stated as uncontrolled   . Essential hypertension, benign   . Chronic back pain   . ED (erectile dysfunction)   . Anemia   . Hyperlipidemia   . CAD, NATIVE VESSEL 05/08/2008  . VENTRICULAR TACHYCARDIA 05/08/2008  . PREMATURE VENTRICULAR CONTRACTIONS 05/08/2008     Past Medical History:  Diagnosis Date  . Allergy   . CAD (coronary artery disease)    pci to LAD 10/09; stable CAD by cath 05/31/08; myoview 04/11/11- no ishcemia, EF 67%; echo 05/15/09- EF>55%, mod calcification of the aortic valve leaflets  . Chronic back pain   . ED (erectile dysfunction)   . Essential hypertension, benign   . Hyperlipidemia   . Multiple rib fractures    left 9th, 10th, and 11th posterior rib fractures S/P fall 12/09/2015/notes 12/12/2015  . Prostate cancer ([place])    s/p  . Type II or unspecified type diabetes mellitus without mention of complication, not stated as uncontrolled   . Ventricular tachycardia ([place])    resuscitated; monitor 05/2008; attempted T ablation 2/10- aborted due to inappropriate substrate    Past Surgical History:  Procedure Laterality Date  . CARDIAC CATHETERIZATION  05/31/08   EF 55-60%, stable lesions, medical therapy  . COLECTOMY  '80's   polyps  . CORONARY ANGIOPLASTY WITH STENT PLACEMENT  01/19/08   PCI stent to mid LAD with Promus DES 27.5x28  . IR GENERIC HISTORICAL  01/03/2016   IR THORACENTESIS ASP PLEURAL SPACE W/IMG GUIDE 01/03/2016 [facility]  . KNEE ARTHROPLASTY Right 03/04/2018   Procedure: COMPUTER ASSISTED TOTAL KNEE ARTHROPLASTY;  Surgeon: Rod Can, MD;  Location: [facility];  Service: Orthopedics;  Laterality: Right;  . PTCA  01/2008    Social History   Socioeconomic History  . Marital status: Legally Separated    Spouse name: Not on file  . Number of children: 7  . Years of education: Not on file  . Highest education level: Not on file  Occupational History  . Occupation: RETIRED    Employer: RETIRED  Social Needs  . Financial resource strain: Not on file  . Food insecurity    Worry: Not on file    Inability: Not on file  . Transportation needs    Medical: Not on file    Non-medical: Not on file  Tobacco Use  . Smoking status: Former Smoker    Packs/day: 0.00    Years: 15.00    Pack years: 0.00    Types:  Cigarettes    Quit date: 05/10/2017    Years since quitting: 1.4  . Smokeless tobacco: Never Used  . Tobacco comment: Patient smokes occasionally. Not everyday.  Substance and Sexual Activity  . Alcohol use: No  . Drug use: No  . Sexual activity: Never  Lifestyle  . Physical activity    Days per week: Not on file    Minutes per session: Not on file  . Stress: Not on file  Relationships  . Social Herbalist on phone: Not on file    Gets together: Not on file    Attends religious service: Not on file    Active member  of club or organization: Not on file    Attends meetings of clubs or organizations: Not on file    Relationship status: Not on file  . Intimate partner violence    Fear of current or ex partner: Not on file    Emotionally abused: Not on file    Physically abused: Not on file    Forced sexual activity: Not on file  Other Topics Concern  . Not on file  Social History Narrative   29 grandchildren. Single. Education: 12 th grade. Exercise: 3-4 times a week for 30 minutes working out and set ups.    Family History  Problem Relation Age of Onset  . Diabetes Mother   . Heart attack Mother   . Leukemia Father   . Heart disease Sister   . Heart disease Brother   . Diabetes Brother        pancreatic cancer     Review of Systems  Constitutional: Negative.  Negative for chills and fever.  HENT: Negative.  Negative for congestion and sore throat.   Eyes: Negative.   Respiratory: Negative.  Negative for cough and shortness of breath.   Cardiovascular: Negative.  Negative for chest pain and palpitations.  Gastrointestinal: Negative.  Negative for diarrhea, nausea and vomiting.  Genitourinary: Negative.  Negative for dysuria and hematuria.  Musculoskeletal: Positive for back pain. Negative for neck pain.  Skin: Negative.  Negative for rash.  Neurological: Negative for dizziness, tingling, sensory change, focal weakness and headaches.  All other systems  reviewed and are negative.  Vitals:   11/04/18 1357  BP: (!) 156/73  Pulse: 67  Resp: 16  Temp: 98.7 F (37.1 C)  SpO2: 98%     Physical Exam Vitals signs reviewed.  Constitutional:      Appearance: Normal appearance.  HENT:     Head: Normocephalic and atraumatic.  Eyes:     Extraocular Movements: Extraocular movements intact.     Pupils: Pupils are equal, round, and reactive to light.  Neck:     Musculoskeletal: Normal range of motion and neck supple.  Cardiovascular:     Rate and Rhythm: Normal rate and regular rhythm.     Heart sounds: Normal heart sounds.  Pulmonary:     Effort: Pulmonary effort is normal.     Breath sounds: Normal breath sounds.  Musculoskeletal:     Lumbar back: He exhibits decreased range of motion and bony tenderness.       Back:  Skin:    General: Skin is warm and dry.  Neurological:     General: No focal deficit present.     Mental Status: He is alert and oriented to person, place, and time.     Sensory: No sensory deficit.     Motor: No weakness.     Coordination: Coordination normal.     Gait: Gait normal.  Psychiatric:        Mood and Affect: Mood normal.        Behavior: Behavior normal.      ASSESSMENT & PLAN: Dev was seen today for flank pain.  Diagnoses and all orders for this visit:  Acute left-sided low back pain without sciatica -     HYDROcodone-acetaminophen (NORCO) 5-325 MG tablet; Take 1 tablet by mouth every 6 (six) hours as needed.  Musculoskeletal pain    Patient Instructions       If you have lab work done today you will be contacted with your lab results within the next  2 weeks.  If you have not heard from [place] then please contact us. The fastest way to get your results is to register for My Chart.   IF you received an x-ray today, you will receive an invoice from [facility]. Please contact [facility] at [phone] with questions or concerns regarding your invoice.   IF you  received labwork today, you will receive an invoice from [facility]. Please contact [facility] at [phone] with questions or concerns regarding your invoice.   Our billing staff will not be able to assist you with questions regarding bills from these companies.  You will be contacted with the lab results as soon as they are available. The fastest way to get your results is to activate your My Chart account. Instructions are located on the last page of this paperwork. If you have not heard from [place] regarding the results in 2 weeks, please contact this office.     Acute Back Pain, Adult Acute back pain is sudden and usually short-lived. It is often caused by an injury to the muscles and tissues in the back. The injury may result from:  A muscle or ligament getting overstretched or torn (strained). Ligaments are tissues that connect bones to each other. Lifting something improperly can cause a back strain.  Wear and tear (degeneration) of the spinal disks. Spinal disks are circular tissue that provides cushioning between the bones of the spine (vertebrae).  Twisting motions, such as while playing sports or doing yard work.  A hit to the back.  Arthritis. You may have a physical exam, lab tests, and imaging tests to find the cause of your pain. Acute back pain usually goes away with rest and home care. Follow these instructions at home: Managing pain, stiffness, and swelling  Take over-the-counter and prescription medicines only as told by your health care provider.  Your health care provider may recommend applying ice during the first 24-48 hours after your pain starts. To do this: ? Put ice in a plastic bag. ? Place a towel between your skin and the bag. ? Leave the ice on for 20 minutes, 2-3 times a day.  If directed, apply heat to the affected area as often as told by your health care provider. Use the heat source that your health care provider recommends, such as a moist heat pack or a  heating pad. ? Place a towel between your skin and the heat source. ? Leave the heat on for 20-30 minutes. ? Remove the heat if your skin turns bright red. This is especially important if you are unable to feel pain, heat, or cold. You have a greater risk of getting burned. Activity   Do not stay in bed. Staying in bed for more than 1-2 days can delay your recovery.  Sit up and stand up straight. Avoid leaning forward when you sit, or hunching over when you stand. ? If you work at a desk, sit close to it so you do not need to lean over. Keep your chin tucked in. Keep your neck drawn back, and keep your elbows bent at a right angle. Your arms should look like the letter "L." ? Sit high and close to the steering wheel when you drive. Add lower back (lumbar) support to your car seat, if needed.  Take short walks on even surfaces as soon as you are able. Try to increase the length of time you walk each day.  Do not sit, drive, or stand in one place  for more than 30 minutes at a time. Sitting or standing for long periods of time can put stress on your back.  Do not drive or use heavy machinery while taking prescription pain medicine.  Use proper lifting techniques. When you bend and lift, use positions that put less stress on your back: ? [place] your knees. ? Keep the load close to your body. ? Avoid twisting.  Exercise regularly as told by your health care provider. Exercising helps your back heal faster and helps prevent back injuries by keeping muscles strong and flexible.  Work with a physical therapist to make a safe exercise program, as recommended by your health care provider. Do any exercises as told by your physical therapist. Lifestyle  Maintain a healthy weight. Extra weight puts stress on your back and makes it difficult to have good posture.  Avoid activities or situations that make you feel anxious or stressed. Stress and anxiety increase muscle tension and can make back pain  worse. Learn ways to manage anxiety and stress, such as through exercise. General instructions  Sleep on a firm mattress in a comfortable position. Try lying on your side with your knees slightly bent. If you lie on your back, put a pillow under your knees.  Follow your treatment plan as told by your health care provider. This may include: ? Cognitive or behavioral therapy. ? Acupuncture or massage therapy. ? Meditation or yoga. Contact a health care provider if:  You have pain that is not relieved with rest or medicine.  You have increasing pain going down into your legs or buttocks.  Your pain does not improve after 2 weeks.  You have pain at night.  You lose weight without trying.  You have a fever or chills. Get help right away if:  You develop new bowel or bladder control problems.  You have unusual weakness or numbness in your arms or legs.  You develop nausea or vomiting.  You develop abdominal pain.  You feel faint. Summary  Acute back pain is sudden and usually short-lived.  Use proper lifting techniques. When you bend and lift, use positions that put less stress on your back.  Take over-the-counter and prescription medicines and apply heat or ice as directed by your health care provider. This information is not intended to replace advice given to you by your health care provider. Make sure you discuss any questions you have with your health care provider. Document Released: 03/25/2005 Document Revised: 07/14/2018 Document Reviewed: 11/06/2016 Elsevier Patient Education  2020 [employer]      Agustina Caroli, MD [facility]

## 2018-11-17 ENCOUNTER — Ambulatory Visit (INDEPENDENT_AMBULATORY_CARE_PROVIDER_SITE_OTHER): Payer: Medicare HMO | Admitting: Family Medicine

## 2018-11-17 VITALS — BP 140/68 | Ht 72.0 in | Wt 228.0 lb

## 2018-11-17 DIAGNOSIS — Z0000 Encounter for general adult medical examination without abnormal findings: Secondary | ICD-10-CM

## 2018-11-17 NOTE — Patient Instructions (Signed)
Thank you for taking time to come for your Medicare Wellness Visit. I appreciate your ongoing commitment to your health goals. Please review the following plan we discussed and let me know if I can assist you in the future.  Robert Kennedy LPN  Preventive Care 83 Years and Older, Male Preventive care refers to lifestyle choices and visits with your health care provider that can promote health and wellness. This includes:  A yearly physical exam. This is also called an annual well check.  Regular dental and eye exams.  Immunizations.  Screening for certain conditions.  Healthy lifestyle choices, such as diet and exercise. What can I expect for my preventive care visit? Physical exam Your health care provider will check:  Height and weight. These may be used to calculate body mass index (BMI), which is a measurement that tells if you are at a healthy weight.  Heart rate and blood pressure.  Your skin for abnormal spots. Counseling Your health care provider may ask you questions about:  Alcohol, tobacco, and drug use.  Emotional well-being.  Home and relationship well-being.  Sexual activity.  Eating habits.  History of falls.  Memory and ability to understand (cognition).  Work and work Statistician. What immunizations do I need?  Influenza (flu) vaccine  This is recommended every year. Tetanus, diphtheria, and pertussis (Tdap) vaccine  You may need a Td booster every 10 years. Varicella (chickenpox) vaccine  You may need this vaccine if you have not already been vaccinated. Zoster (shingles) vaccine  You may need this after age 83. Pneumococcal conjugate (PCV13) vaccine  One dose is recommended after age 83. Pneumococcal polysaccharide (PPSV23) vaccine  One dose is recommended after age 83. Measles, mumps, and rubella (MMR) vaccine  You may need at least one dose of MMR if you were born in 1957 or later. You may also need a second dose. Meningococcal  conjugate (MenACWY) vaccine  You may need this if you have certain conditions. Hepatitis A vaccine  You may need this if you have certain conditions or if you travel or work in places where you may be exposed to hepatitis A. Hepatitis B vaccine  You may need this if you have certain conditions or if you travel or work in places where you may be exposed to hepatitis B. Haemophilus influenzae type b (Hib) vaccine  You may need this if you have certain conditions. You may receive vaccines as individual doses or as more than one vaccine together in one shot (combination vaccines). Talk with your health care provider about the risks and benefits of combination vaccines. What tests do I need? Blood tests  Lipid and cholesterol levels. These may be checked every 5 years, or more frequently depending on your overall health.  Hepatitis C test.  Hepatitis B test. Screening  Lung cancer screening. You may have this screening every year starting at age 83 if you have a 30-pack-year history of smoking and currently smoke or have quit within the past 15 years.  Colorectal cancer screening. All adults should have this screening starting at age 83 and continuing until age 61. Your health care provider may recommend screening at age 83 if you are at increased risk. You will have tests every 1-10 years, depending on your results and the type of screening test.  Prostate cancer screening. Recommendations will vary depending on your family history and other risks.  Diabetes screening. This is done by checking your blood sugar (glucose) after you have not eaten for  a while (fasting). You may have this done every 1-3 years.  Abdominal aortic aneurysm (AAA) screening. You may need this if you are a current or former smoker.  Sexually transmitted disease (STD) testing. Follow these instructions at home: Eating and drinking  Eat a diet that includes fresh fruits and vegetables, whole grains, lean  protein, and low-fat dairy products. Limit your intake of foods with high amounts of sugar, saturated fats, and salt.  Take vitamin and mineral supplements as recommended by your health care provider.  Do not drink alcohol if your health care provider tells you not to drink.  If you drink alcohol: ? Limit how much you have to 0-2 drinks a day. ? Be aware of how much alcohol is in your drink. In the [place] one drink equals one 12 oz bottle of beer (355 mL), one 5 oz glass of wine (148 mL), or one 1 oz glass of hard liquor (44 mL). Lifestyle  Take daily care of your teeth and gums.  Stay active. Exercise for at least 30 minutes on 5 or more days each week.  Do not use any products that contain nicotine or tobacco, such as cigarettes, e-cigarettes, and chewing tobacco. If you need help quitting, ask your health care provider.  If you are sexually active, practice safe sex. Use a condom or other form of protection to prevent STIs (sexually transmitted infections).  Talk with your health care provider about taking a low-dose aspirin or statin. What's next?  Visit your health care provider once a year for a well check visit.  Ask your health care provider how often you should have your eyes and teeth checked.  Stay up to date on all vaccines. This information is not intended to replace advice given to you by your health care provider. Make sure you discuss any questions you have with your health care provider. Document Released: 04/21/2015 Document Revised: 03/19/2018 Document Reviewed: 03/19/2018 Elsevier Patient Education  2020 [employer].

## 2018-11-17 NOTE — Progress Notes (Addendum)
Presents today for [employer] Visit   Date of last exam: 11/04/2018  Interpreter used for this visit?  No  I connected with  [name] on 11/17/18 by telephone application and verified that I am speaking with the correct person using two identifiers.    Patient Care Team: [name], MD as PCP - General (Family Medicine) Lorretta Harp, MD as PCP - Cardiology (Cardiology) Lorretta Harp, MD as Consulting Physician (Cardiology)   Other items to address today:   Discussed Eye/Dental  Discussed immunizations Follow up appointment 01/18/2019 Dr. Carlota Raspberry   Other Screening: Last screening for diabetes: 10/19/2018 Last lipid screening: 10/19/2018  ADVANCE DIRECTIVES: Discussed: yes On File: yes Materials Provided: no  Immunization status:  Immunization History  Administered Date(s) Administered   Influenza, High Dose Seasonal PF 01/12/2018   Influenza,inj,Quad PF,6+ Mos 01/04/2013, 12/27/2013, 01/02/2015, 12/30/2015, 02/10/2017   Influenza,inj,quad, With Preservative 01/06/2017   Pneumococcal Conjugate-13 12/27/2013   Pneumococcal Polysaccharide-23 01/04/2013   Td 04/08/2009   Zoster 04/08/2009     Health Maintenance Due  Topic Date Due   INFLUENZA VACCINE  11/07/2018     Functional Status Survey: Is the patient deaf or have difficulty hearing?: No Does the patient have difficulty seeing, even when wearing glasses/contacts?: No Does the patient have difficulty concentrating, remembering, or making decisions?: No Does the patient have difficulty walking or climbing stairs?: No Does the patient have difficulty dressing or bathing?: No Does the patient have difficulty doing errands alone such as visiting a doctor's office or shopping?: No    6CIT Screen 11/17/2018 12/19/2016  What Year? 0 points 0 points  What month? 0 points 0 points  What time? 0 points 0 points  Count back from 20 0 points 0 points  Months in reverse 0  points 4 points  Repeat phrase 0 points 10 points  Total Score 0 14        Clinical Support from 11/17/2018 in [facility] Care at [facility]  AUDIT-C [place]:   Live in one story with wife No scattered rugs Yes grab bar No clutter/ adequate lighting   Patient Active Problem List   Diagnosis Date Noted   Osteoarthritis of right knee 03/04/2018   Rib pain on left side 07/01/2016   Fall 12/14/2015   Elevated serum creatinine 12/14/2015   Ileus ([place]) 12/14/2015   Multiple fractures of ribs of left side 12/12/2015   Prostate cancer ([place]) 05/26/2014   Blood in the urine 12/01/2013   Cystitis, radiation 12/01/2013   Hematuria 12/01/2013   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   Malignant neoplasm of prostate ([place]) 11/26/2013   Male erectile dysfunction 11/26/2013   Pulsatile tinnitus 11/19/2013   Rectal bleeding 12/23/2012   Type II or unspecified type diabetes mellitus without mention of complication, not stated as uncontrolled    Essential hypertension, benign    Chronic back pain    ED (erectile dysfunction)    Anemia    Hyperlipidemia    CAD, NATIVE VESSEL 05/08/2008   VENTRICULAR TACHYCARDIA 05/08/2008   PREMATURE VENTRICULAR CONTRACTIONS 05/08/2008     Past Medical History:  Diagnosis Date   Allergy    CAD (coronary artery disease)    pci to LAD 10/09; stable CAD by cath 05/31/08; myoview 04/11/11- no ishcemia, EF 67%; echo 05/15/09- EF>55%, mod calcification of the aortic valve leaflets   Chronic back pain    ED (erectile dysfunction)  Essential hypertension, benign    Hyperlipidemia    Multiple rib fractures    left 9th, 10th, and 11th posterior rib fractures S/P fall 12/09/2015/notes 12/12/2015   Prostate cancer ([place])    s/p   Type II or unspecified type diabetes mellitus without mention of complication, not stated as uncontrolled    Ventricular tachycardia ([place])    resuscitated; monitor 05/2008; attempted T ablation 2/10- aborted due to  inappropriate substrate     Past Surgical History:  Procedure Laterality Date   CARDIAC CATHETERIZATION  05/31/08   EF 55-60%, stable lesions, medical therapy   COLECTOMY  '80's   polyps   CORONARY ANGIOPLASTY WITH STENT PLACEMENT  01/19/08   PCI stent to mid LAD with Promus DES 27.5x28   IR GENERIC HISTORICAL  01/03/2016   IR THORACENTESIS ASP PLEURAL SPACE W/IMG GUIDE 01/03/2016 [facility]   KNEE ARTHROPLASTY Right 03/04/2018   Procedure: COMPUTER ASSISTED TOTAL KNEE ARTHROPLASTY;  Surgeon: Rod Can, MD;  Location: [facility];  Service: Orthopedics;  Laterality: Right;   PTCA  01/2008     Family History  Problem Relation Age of Onset   Diabetes Mother    Heart attack Mother    Leukemia Father    Heart disease Sister    Heart disease Brother    Diabetes Brother        pancreatic cancer     Social History   Socioeconomic History   Marital status: Legally Separated    Spouse name: Not on file   Number of children: 7   Years of education: Not on file   Highest education level: Not on file  Occupational History   Occupation: RETIRED    Employer: RETIRED  Social Designer, fashion/clothing strain: Not on file   Food insecurity    Worry: Not on file    Inability: Not on file   Transportation needs    Medical: Not on file    Non-medical: Not on file  Tobacco Use   Smoking status: Former Smoker    Packs/day: 0.00    Years: 15.00    Pack years: 0.00    Types: Cigarettes    Quit date: 05/10/2017    Years since quitting: 1.5   Smokeless tobacco: Never Used   Tobacco comment: Patient smokes occasionally. Not everyday.  Substance and Sexual Activity   Alcohol use: No   Drug use: No   Sexual activity: Never  Lifestyle   Physical activity    Days per week: Not on file    Minutes per session: Not on file   Stress: Not on file  Relationships   Social connections    Talks on phone: Not on file    Gets together: Not on file    Attends religious service: Not on  file    Active member of club or organization: Not on file    Attends meetings of clubs or organizations: Not on file    Relationship status: Not on file   Intimate partner violence    Fear of current or ex partner: Not on file    Emotionally abused: Not on file    Physically abused: Not on file    Forced sexual activity: Not on file  Other Topics Concern   Not on file  Social History Narrative   29 grandchildren. Single. Education: 12 th grade. Exercise: 3-4 times a week for 30 minutes working out and set ups.     Allergies  Allergen Reactions  Contrast Media [Iodinated Diagnostic Agents] Swelling and Other (See Comments)    Possible neck swelling after contrast for chest CT 03/2016. No hives, throat or other symptoms     Prior to Admission medications   Medication Sig Start Date End Date Taking? Authorizing Provider  amLODipine (NORVASC) 10 MG tablet Take 1 tablet (10 mg total) by mouth daily. 10/19/18  Yes [name], MD  atorvastatin (LIPITOR) 10 MG tablet Take 1 tablet (10 mg total) by mouth daily. 10/19/18  Yes [name], MD  Blood Glucose Monitoring Suppl (ACCU-CHEK AVIVA PLUS) w/Device KIT Test blood sugar 3 times daily. Dx code: E11.22 04/03/16  Yes [name], MD  ferrous sulfate 325 (65 FE) MG tablet Take 325 mg by mouth daily with breakfast.   Yes [provider]  glucose blood (ACCU-CHEK AVIVA PLUS) test strip Test blood sugar 3 times daily. Dx code: E11.22 04/03/16  Yes [name], MD  insulin NPH-regular Human (NOVOLIN 70/30) (70-30) 100 UNIT/ML injection ADMINISTER 8 UNITS UNDER THE SKIN TWICE DAILY WITH A MEAL 10/19/18  Yes [name], MD  Insulin Syringe-Needle U-100 (B-D INS SYR HALF-UNIT .3CC/31G) 31G X 5/16" 0.3 ML MISC Use daily at bedtime to inject insulin. Dx code: 250.00 06/21/13  Yes [name], MD  Lancets (ACCU-CHEK MULTICLIX) lancets Test blood sugar 3 times daily. Dx code: E11.22 04/03/16  Yes [name], MD  lisinopril (ZESTRIL) 20 MG tablet Take 1 tablet (20 mg total) by mouth daily. 10/19/18  Yes [name], MD  metFORMIN (GLUCOPHAGE) 1000 MG tablet Take 1 tablet (1,000 mg total) by mouth 2 (two) times daily with a meal. 10/19/18  Yes [name], MD  metoprolol tartrate (LOPRESSOR) 50 MG tablet Take 1 tablet (50 mg total) by mouth 2 (two) times daily. 10/19/18  Yes [name], MD  mirabegron ER (MYRBETRIQ) 25 MG TB24 tablet Take 25 mg by mouth daily.   Yes [provider]  Multiple Vitamin (MULTIVITAMIN) tablet Take 1 tablet by mouth daily. COMPLETE SENIOR VITAMINS AND M   Yes [provider]  Omega-3 Fatty Acids (FISH OIL) 1000 MG CAPS Take 2 capsules by mouth daily.    Yes [provider]  Vitamin D, Cholecalciferol, 1000 UNITS TABS Take 1 tablet by mouth daily.    Yes [provider]  HYDROcodone-acetaminophen (NORCO) 5-325 MG tablet Take 1 tablet by mouth every 6 (six) hours as needed. Patient not taking: Reported on 11/17/2018 11/04/18   Horald Pollen, MD  lisinopril-hydrochlorothiazide (ZESTORETIC) 20-25 MG tablet Take 1 tablet by mouth daily. Patient not taking: Reported on 11/17/2018 10/19/18   [name], MD     Depression screen [facility] 2/9 11/17/2018 11/04/2018 10/19/2018 07/14/2018 07/02/2018  Decreased Interest 0 0 0 0 0  Down, Depressed, Hopeless 0 0 0 0 0  PHQ - 2 Score 0 0 0 0 0  Some recent data might be hidden     Fall Risk  11/17/2018 11/04/2018 10/19/2018 07/14/2018 07/02/2018  Falls in the past year? 0 0 0 0 0  Number falls in past yr: 0 - 0 - 0  Comment - - - - -  Injury with Fall? 0 - 0 - 1  Comment - - - - -  Risk Factor Category  - - - - -  Risk for fall due to : - - - - -  Follow up - - Falls evaluation completed - -  PHYSICAL EXAM: BP 140/68 Comment: taken from previous visit  Ht 6' (1.829 m)   Wt 228 lb (103.4 kg)   BMI 30.92 kg/m    Wt Readings from Last 3 Encounters:  11/17/18  228 lb (103.4 kg)  11/04/18 228 lb 3.2 oz (103.5 kg)  10/19/18 230 lb (104.3 kg)     No exam data present    Physical Exam  1. Medicare annual wellness visit, subsequent   Education/Counseling provided regarding diet and exercise, prevention of chronic diseases, smoking/tobacco cessation, if applicable, and reviewed "Covered Medicare Preventive Services."

## 2018-12-02 ENCOUNTER — Other Ambulatory Visit: Payer: Self-pay

## 2018-12-02 ENCOUNTER — Ambulatory Visit (INDEPENDENT_AMBULATORY_CARE_PROVIDER_SITE_OTHER): Payer: Medicare HMO | Admitting: Family Medicine

## 2018-12-02 DIAGNOSIS — Z23 Encounter for immunization: Secondary | ICD-10-CM

## 2018-12-02 NOTE — Patient Instructions (Signed)
Injection given, pt tolerated well.

## 2019-01-18 ENCOUNTER — Encounter: Payer: Self-pay | Admitting: Family Medicine

## 2019-01-18 ENCOUNTER — Ambulatory Visit (INDEPENDENT_AMBULATORY_CARE_PROVIDER_SITE_OTHER): Payer: Medicare HMO | Admitting: Family Medicine

## 2019-01-18 ENCOUNTER — Other Ambulatory Visit: Payer: Self-pay

## 2019-01-18 VITALS — BP 155/73 | HR 76 | Temp 98.7°F | Wt 235.4 lb

## 2019-01-18 DIAGNOSIS — E785 Hyperlipidemia, unspecified: Secondary | ICD-10-CM | POA: Diagnosis not present

## 2019-01-18 DIAGNOSIS — Z794 Long term (current) use of insulin: Secondary | ICD-10-CM

## 2019-01-18 DIAGNOSIS — E1129 Type 2 diabetes mellitus with other diabetic kidney complication: Secondary | ICD-10-CM | POA: Diagnosis not present

## 2019-01-18 DIAGNOSIS — I10 Essential (primary) hypertension: Secondary | ICD-10-CM | POA: Diagnosis not present

## 2019-01-18 DIAGNOSIS — N183 Chronic kidney disease, stage 3 unspecified: Secondary | ICD-10-CM

## 2019-01-18 DIAGNOSIS — R7989 Other specified abnormal findings of blood chemistry: Secondary | ICD-10-CM | POA: Diagnosis not present

## 2019-01-18 DIAGNOSIS — R809 Proteinuria, unspecified: Secondary | ICD-10-CM | POA: Diagnosis not present

## 2019-01-18 LAB — COMPREHENSIVE METABOLIC PANEL
ALT: 12 IU/L (ref 0–44)
AST: 13 IU/L (ref 0–40)
Albumin/Globulin Ratio: 1.5 (ref 1.2–2.2)
Albumin: 4 g/dL (ref 3.6–4.6)
Alkaline Phosphatase: 47 IU/L (ref 39–117)
BUN/Creatinine Ratio: 10 (ref 10–24)
BUN: 18 mg/dL (ref 8–27)
Bilirubin Total: 0.2 mg/dL (ref 0.0–1.2)
CO2: 20 mmol/L (ref 20–29)
Calcium: 9.1 mg/dL (ref 8.6–10.2)
Chloride: 106 mmol/L (ref 96–106)
Creatinine, Ser: 1.8 mg/dL — ABNORMAL HIGH (ref 0.76–1.27)
GFR calc Af Amer: 40 mL/min/{1.73_m2} — ABNORMAL LOW (ref >59)
GFR calc non Af Amer: 34 mL/min/{1.73_m2} — ABNORMAL LOW (ref >59)
Globulin, Total: 2.6 g/dL (ref 1.5–4.5)
Glucose: 107 mg/dL — ABNORMAL HIGH (ref 65–99)
Potassium: 4.3 mmol/L (ref 3.5–5.2)
Sodium: 137 mmol/L (ref 134–144)
Total Protein: 6.6 g/dL (ref 6.0–8.5)

## 2019-01-18 LAB — HEMOGLOBIN A1C
Est. average glucose Bld gHb Est-mCnc: 166 mg/dL
Hgb A1c MFr Bld: 7.4 % — ABNORMAL HIGH (ref 4.8–5.6)

## 2019-01-18 NOTE — Progress Notes (Signed)
Subjective:    Patient ID: [name], male    DOB: 10/11/1933, 85 y.o.   MRN: [id number]  HPI [name] is a 85 y.o. male Presents today for: Chief Complaint  Patient presents with  . chronic medical condition    Patient is here for his 3 month f/u   Doing well. Planning on L knee surgery.  Wearing mask at all times outside of house, and all visitors to house are masked.   Diabetes: Complicated by microalbuminuria, CKD, he is on insulin. Currently taking Metformin 1000 mg twice daily, 70/30 insulin 8 units twice daily.  He is on statin with Lipitor 10 mg daily, and ACE inhibitor with lisinopril 20 mg daily in addition to lisinopril HCTZ combo 20/25 mg. Microalbumin: Elevated ratio of 345 on 10/20/2018 Optho, foot exam, pneumovax: Ophthalmology exam 07/17/2017, otherwise up-to-date. Home readings: fasting 110 range.  No symptomatic lows.  Weight up 7#. Less active with pandemic.  Wt Readings from Last 3 Encounters:  01/18/19 235 lb 6.4 oz (106.8 kg)  11/17/18 228 lb (103.4 kg)  11/04/18 228 lb 3.2 oz (103.5 kg)    Lab Results  Component Value Date   HGBA1C 7.0 (H) 10/19/2018   HGBA1C 6.8 (A) 06/11/2018   HGBA1C 7.5 (A) 02/13/2018   Lab Results  Component Value Date   MICROALBUR 83.3 05/17/2015   LDLCALC 57 10/19/2018   CREATININE 1.76 (H) 10/19/2018   Elevated creatinine; Suspected component of chronic kidney disease, last creatinine 1.76 on July 13, which was increased from March 5.  However appears to been in a similar range throughout 2019.  EGFR 41.  Plan for Few week follow-up from July visit.  Has not seen a nephrologist.  Drinking plenty of water throughout day. No nsaids.   Hypertension: BP Readings from Last 3 Encounters:  01/18/19 (!) 161/71  11/17/18 140/68  11/04/18 (!) 156/73   Lab Results  Component Value Date   CREATININE 1.76 (H) 10/19/2018   Is taking amlodipine 10 mg daily, lisinopril/HCTZ 20/25 mg with additional 20 mg lisinopril  daily, metoprolol 50 mg twice daily.  Cardiologist Dr. [name], last appointment February 03, 2018, upcoming appointment November 3. History of CAD with stenting of LAD in 2009, attempt at VT ablation in February 2010 that was aborted due to inappropriate substrate.  Unable to take aspirin due to rectal bleeding.  Myoview in 2013 normal.  Has not taken meds yet this am. Just coffee.  Home readings: 119-130/70-80.   Hyperlipidemia:  Lab Results  Component Value Date   CHOL 125 10/19/2018   HDL 41 10/19/2018   LDLCALC 57 10/19/2018   TRIG 133 10/19/2018   CHOLHDL 3.0 10/19/2018   Lab Results  Component Value Date   ALT 9 10/19/2018   AST 11 10/19/2018   ALKPHOS 43 10/19/2018   BILITOT 0.2 10/19/2018  Takes Lipitor 10 mg daily. No new myalgias.   Prostate cancer, bladder cancer: Urologist Dr. [name] at [place].  Last appointment in February.  Flexible cystoscopy at that time without signs of bladder cancer recurrence.  Patient Active Problem List   Diagnosis Date Noted  . Osteoarthritis of right knee 03/04/2018  . Rib pain on left side 07/01/2016  . Fall 12/14/2015  . Elevated serum creatinine 12/14/2015  . Ileus ([place]) 12/14/2015  . Multiple fractures of ribs of left side 12/12/2015  . Prostate cancer ([place]) 05/26/2014  . Blood in the urine 12/01/2013  . Cystitis, radiation 12/01/2013  . Hematuria 12/01/2013  .  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 Malignant neoplasm of prostate ([place]) 11/26/2013  . Male erectile dysfunction 11/26/2013  . Pulsatile tinnitus 11/19/2013  . Rectal bleeding 12/23/2012  . Type II or unspecified type diabetes mellitus without mention of complication, not stated as uncontrolled   . Essential hypertension, benign   . Chronic back pain   . ED (erectile dysfunction)   . Anemia   . Hyperlipidemia   . CAD, NATIVE VESSEL 05/08/2008  . VENTRICULAR TACHYCARDIA 05/08/2008  . PREMATURE VENTRICULAR CONTRACTIONS 05/08/2008   Past Medical History:   Diagnosis Date  . Allergy   . CAD (coronary artery disease)    pci to LAD 10/09; stable CAD by cath 05/31/08; myoview 04/11/11- no ishcemia, EF 67%; echo 05/15/09- EF>55%, mod calcification of the aortic valve leaflets  . Chronic back pain   . ED (erectile dysfunction)   . Essential hypertension, benign   . Hyperlipidemia   . Multiple rib fractures    left 9th, 10th, and 11th posterior rib fractures S/P fall 12/09/2015/notes 12/12/2015  . Prostate cancer ([place])    s/p  . Type II or unspecified type diabetes mellitus without mention of complication, not stated as uncontrolled   . Ventricular tachycardia ([place])    resuscitated; monitor 05/2008; attempted T ablation 2/10- aborted due to inappropriate substrate   Past Surgical History:  Procedure Laterality Date  . CARDIAC CATHETERIZATION  05/31/08   EF 55-60%, stable lesions, medical therapy  . COLECTOMY  '80's   polyps  . CORONARY ANGIOPLASTY WITH STENT PLACEMENT  01/19/08   PCI stent to mid LAD with Promus DES 27.5x28  . IR GENERIC HISTORICAL  01/03/2016   IR THORACENTESIS ASP PLEURAL SPACE W/IMG GUIDE 01/03/2016 [facility]INTER[facility]  . KNEE ARTHROPLASTY Right 03/04/2018   Procedure: COMPUTER ASSISTED TOTAL KNEE ARTHROPLASTY;  Surgeon: Rod Can, MD;  Location: [facility];  Service: Orthopedics;  Laterality: Right;  . PTCA  01/2008   Allergies  Allergen Reactions  . Contrast Media [Iodinated Diagnostic Agents] Swelling and Other (See Comments)    Possible neck swelling after contrast for chest CT 03/2016. No hives, throat or other symptoms   Prior to Admission medications   Medication Sig Start Date End Date Taking? Authorizing Provider  amLODipine (NORVASC) 10 MG tablet Take 1 tablet (10 mg total) by mouth daily. 10/19/18  Yes [name], MD  atorvastatin (LIPITOR) 10 MG tablet Take 1 tablet (10 mg total) by mouth daily. 10/19/18  Yes [name], MD  Blood Glucose Monitoring Suppl (ACCU-CHEK AVIVA PLUS) w/Device KIT Test blood sugar 3  times daily. Dx code: E11.22 04/03/16  Yes [name], MD  ferrous sulfate 325 (65 FE) MG tablet Take 325 mg by mouth daily with breakfast.   Yes [provider]  glucose blood (ACCU-CHEK AVIVA PLUS) test strip Test blood sugar 3 times daily. Dx code: E11.22 04/03/16  Yes [name], MD  HYDROcodone-acetaminophen [facility]) 5-325 MG tablet Take 1 tablet by mouth every 6 (six) hours as needed. 11/04/18  Yes Sagardia, Ines Bloomer, MD  insulin NPH-regular Human (NOVOLIN 70/30) (70-30) 100 UNIT/ML injection ADMINISTER 8 UNITS UNDER THE SKIN TWICE DAILY WITH A MEAL 10/19/18  Yes [name], MD  Insulin Syringe-Needle U-100 (B-D INS SYR HALF-UNIT .3CC/31G) 31G X 5/16" 0.3 ML MISC Use daily at bedtime to inject insulin. Dx code: 250.00 06/21/13  Yes [name], MD  Lancets (ACCU-CHEK MULTICLIX) lancets Test blood sugar 3 times daily. Dx code: E11.22 04/03/16  Yes Carlota Raspberry,  Ranell Patrick, MD  lisinopril (ZESTRIL) 20 MG tablet Take 1 tablet (20 mg total) by mouth daily. 10/19/18  Yes [name], MD  lisinopril-hydrochlorothiazide (ZESTORETIC) 20-25 MG tablet Take 1 tablet by mouth daily. 10/19/18  Yes [name], MD  metFORMIN (GLUCOPHAGE) 1000 MG tablet Take 1 tablet (1,000 mg total) by mouth 2 (two) times daily with a meal. 10/19/18  Yes [name], MD  metoprolol tartrate (LOPRESSOR) 50 MG tablet Take 1 tablet (50 mg total) by mouth 2 (two) times daily. 10/19/18  Yes [name], MD  mirabegron ER (MYRBETRIQ) 25 MG TB24 tablet Take 25 mg by mouth daily.   Yes [provider]  Multiple Vitamin (MULTIVITAMIN) tablet Take 1 tablet by mouth daily. COMPLETE SENIOR VITAMINS AND M   Yes [provider]  Omega-3 Fatty Acids (FISH OIL) 1000 MG CAPS Take 2 capsules by mouth daily.    Yes [provider]  Vitamin D, Cholecalciferol, 1000 UNITS TABS Take 1 tablet by mouth daily.    Yes [provider]   Social History   Socioeconomic  History  . Marital status: Legally Separated    Spouse name: Not on file  . Number of children: 7  . Years of education: Not on file  . Highest education level: Not on file  Occupational History  . Occupation: RETIRED    Employer: RETIRED  Social Needs  . Financial resource strain: Not on file  . Food insecurity    Worry: Not on file    Inability: Not on file  . Transportation needs    Medical: Not on file    Non-medical: Not on file  Tobacco Use  . Smoking status: Former Smoker    Packs/day: 0.00    Years: 15.00    Pack years: 0.00    Types: Cigarettes    Quit date: 05/10/2017    Years since quitting: 1.6  . Smokeless tobacco: Never Used  . Tobacco comment: Patient smokes occasionally. Not everyday.  Substance and Sexual Activity  . Alcohol use: No  . Drug use: No  . Sexual activity: Never  Lifestyle  . Physical activity    Days per week: Not on file    Minutes per session: Not on file  . Stress: Not on file  Relationships  . Social Herbalist on phone: Not on file    Gets together: Not on file    Attends religious service: Not on file    Active member of club or organization: Not on file    Attends meetings of clubs or organizations: Not on file    Relationship status: Not on file  . Intimate partner violence    Fear of current or ex partner: Not on file    Emotionally abused: Not on file    Physically abused: Not on file    Forced sexual activity: Not on file  Other Topics Concern  . Not on file  Social History Narrative   29 grandchildren. Single. Education: 12 th grade. Exercise: 3-4 times a week for 30 minutes working out and set ups.    Review of Systems  Constitutional: Negative for fatigue and unexpected weight change.  Eyes: Negative for visual disturbance.  Respiratory: Negative for cough, chest tightness and shortness of breath.   Cardiovascular: Negative for chest pain, palpitations and leg swelling.  Gastrointestinal: Negative for  abdominal pain and blood in stool.  Neurological: Negative for dizziness, light-headedness and headaches.  Objective:   Physical Exam Vitals signs reviewed.  Constitutional:      Appearance: He is well-developed.  HENT:     Head: Normocephalic and atraumatic.  Eyes:     Pupils: Pupils are equal, round, and reactive to light.  Neck:     Vascular: No carotid bruit or JVD.  Cardiovascular:     Rate and Rhythm: Normal rate and regular rhythm.     Heart sounds: Normal heart sounds. No murmur.  Pulmonary:     Effort: Pulmonary effort is normal.     Breath sounds: Normal breath sounds. No rales.  Skin:    General: Skin is warm and dry.  Neurological:     Mental Status: He is alert and oriented to person, place, and time.    Vitals:   01/18/19 0829 01/18/19 0908  BP: (!) 161/71 (!) 155/73  Pulse: 76   Temp: 98.7 F (37.1 C)   TempSrc: Oral   SpO2: 97%   Weight: 235 lb 6.4 oz (106.8 kg)       Assessment & Plan:  [name] is a 85 y.o. male Type 2 diabetes mellitus with microalbuminuria, with long-term current use of insulin ([place]) - Plan: Ambulatory referral to Nephrology, Hemoglobin A1c  -Borderline but stable control previously, tolerating Metformin and insulin at current doses without hypoglycemia.  Will monitor renal function closely as may need to decrease dose of Metformin or stop it, especially if GFR is closer to 30.   Stage 3 chronic kidney disease, unspecified whether stage 3a or 3b CKD - Plan: Ambulatory referral to Nephrology, Comprehensive metabolic panel Elevated serum creatinine - Plan: Comprehensive metabolic panel  -Reviewed labs.  Continue to avoid NSAIDs.  Maintain hydration.  Will need to adjust or stop Metformin depending on reading.  Nephrology eval ordered.  Essential hypertension - Plan: Ambulatory referral to Nephrology  -Elevated in office, home readings improved.  Continue monitoring at home, has cardiology follow-up next month.  No changes  for now.  Hyperlipidemia, unspecified hyperlipidemia type  -Tolerating statin, continue same.  No orders of the defined types were placed in this encounter.  Patient Instructions   Depending on your kidney function we may need to adjust or stop Metformin.  If you have any illness with vomiting, diarrhea, or trouble taking in fluids, stop Metformin right away.  I will also refer you to a kidney specialist.  No other medication changes for now.  Take care and stay safe.    If you have lab work done today you will be contacted with your lab results within the next 2 weeks.  If you have not heard from [place] then please contact us. The fastest way to get your results is to register for My Chart.   IF you received an x-ray today, you will receive an invoice from [facility]. Please contact [facility] at [phone] with questions or concerns regarding your invoice.   IF you received labwork today, you will receive an invoice from [place]. Please contact [facility] at [phone] with questions or concerns regarding your invoice.   Our billing staff will not be able to assist you with questions regarding bills from these companies.  You will be contacted with the lab results as soon as they are available. The fastest way to get your results is to activate your My Chart account. Instructions are located on the last page of this paperwork. If you have not heard from [place] regarding the results in 2 weeks, please contact this  office.       Signed,   [name], MD [facility] [place].  01/18/19 9:11 AM

## 2019-01-18 NOTE — Patient Instructions (Addendum)
Depending on your kidney function we may need to adjust or stop Metformin.  If you have any illness with vomiting, diarrhea, or trouble taking in fluids, stop Metformin right away.  I will also refer you to a kidney specialist.  No other medication changes for now.  Take care and stay safe.    If you have lab work done today you will be contacted with your lab results within the next 2 weeks.  If you have not heard from [place] then please contact us. The fastest way to get your results is to register for My Chart.   IF you received an x-ray today, you will receive an invoice from [facility]. Please contact [facility] at [phone] with questions or concerns regarding your invoice.   IF you received labwork today, you will receive an invoice from [place]. Please contact [facility] at [phone] with questions or concerns regarding your invoice.   Our billing staff will not be able to assist you with questions regarding bills from these companies.  You will be contacted with the lab results as soon as they are available. The fastest way to get your results is to activate your My Chart account. Instructions are located on the last page of this paperwork. If you have not heard from [place] regarding the results in 2 weeks, please contact this office.

## 2019-01-26 ENCOUNTER — Encounter: Payer: Self-pay | Admitting: Radiology

## 2019-02-03 DIAGNOSIS — N1832 Chronic kidney disease, stage 3b: Secondary | ICD-10-CM | POA: Diagnosis not present

## 2019-02-03 DIAGNOSIS — E1122 Type 2 diabetes mellitus with diabetic chronic kidney disease: Secondary | ICD-10-CM | POA: Diagnosis not present

## 2019-02-03 DIAGNOSIS — N390 Urinary tract infection, site not specified: Secondary | ICD-10-CM | POA: Diagnosis not present

## 2019-02-03 DIAGNOSIS — I129 Hypertensive chronic kidney disease with stage 1 through stage 4 chronic kidney disease, or unspecified chronic kidney disease: Secondary | ICD-10-CM | POA: Diagnosis not present

## 2019-02-03 DIAGNOSIS — R809 Proteinuria, unspecified: Secondary | ICD-10-CM | POA: Diagnosis not present

## 2019-02-05 ENCOUNTER — Ambulatory Visit: Payer: Medicare HMO | Admitting: Cardiovascular Disease

## 2019-02-08 ENCOUNTER — Other Ambulatory Visit: Payer: Self-pay | Admitting: Registered Nurse

## 2019-02-08 DIAGNOSIS — N1832 Chronic kidney disease, stage 3b: Secondary | ICD-10-CM

## 2019-02-09 ENCOUNTER — Ambulatory Visit (INDEPENDENT_AMBULATORY_CARE_PROVIDER_SITE_OTHER): Payer: Medicare HMO | Admitting: Cardiovascular Disease

## 2019-02-09 ENCOUNTER — Other Ambulatory Visit: Payer: Self-pay

## 2019-02-09 ENCOUNTER — Encounter: Payer: Self-pay | Admitting: Cardiovascular Disease

## 2019-02-09 VITALS — BP 164/72 | HR 87 | Temp 97.7°F | Ht 72.0 in | Wt 235.6 lb

## 2019-02-09 DIAGNOSIS — I10 Essential (primary) hypertension: Secondary | ICD-10-CM | POA: Diagnosis not present

## 2019-02-09 DIAGNOSIS — I472 Ventricular tachycardia: Secondary | ICD-10-CM | POA: Diagnosis not present

## 2019-02-09 DIAGNOSIS — E782 Mixed hyperlipidemia: Secondary | ICD-10-CM | POA: Diagnosis not present

## 2019-02-09 DIAGNOSIS — I2510 Atherosclerotic heart disease of native coronary artery without angina pectoris: Secondary | ICD-10-CM

## 2019-02-09 DIAGNOSIS — I4729 Other ventricular tachycardia: Secondary | ICD-10-CM

## 2019-02-09 LAB — HEPATIC FUNCTION PANEL
ALT: 13 IU/L (ref 0–44)
AST: 12 IU/L (ref 0–40)
Albumin: 4.1 g/dL (ref 3.6–4.6)
Alkaline Phosphatase: 46 IU/L (ref 39–117)
Bilirubin Total: 0.2 mg/dL (ref 0.0–1.2)
Bilirubin, Direct: 0.09 mg/dL (ref 0.00–0.40)
Total Protein: 6.7 g/dL (ref 6.0–8.5)

## 2019-02-09 LAB — LIPID PANEL
Chol/HDL Ratio: 3.1 ratio (ref 0.0–5.0)
Cholesterol, Total: 118 mg/dL (ref 100–199)
HDL: 38 mg/dL — ABNORMAL LOW (ref >39)
LDL Chol Calc (NIH): 55 mg/dL (ref 0–99)
Triglycerides: 142 mg/dL (ref 0–149)
VLDL Cholesterol Cal: 25 mg/dL (ref 5–40)

## 2019-02-09 NOTE — Progress Notes (Signed)
02/09/2019 [name]   11/10/1933  [id number]  Primary Physician Robert Agreste, MD Primary Cardiologist: Robert Harp MD [name], [place]  HPI:  [name] is a 85 y.o.  moderately overweight, recently remarried for the third time (09/30/14)African American male, father of 3 children and 25 stepchildren, grandfather to 25 grandchildren, who is formerly a patient of Dr. Francine Acosta. I last saw him  01/25/2018. He has a history of CAD status post LAD stenting with a Promus drug-eluting stent, October of 2009. He had attempt at VT ablation by Dr. Thompson Acosta, February 2010; however, this was aborted because of inappropriate substrate. His other problems include hypertension, hyperlipidemia, diabetes, as well as history of prostate cancer. Since I saw him, he has been asymptomatic. His last Myoview performed 04/11/11 was completely normal. He did stop his aspirin because of rectal bleeding is noticed increased evening flushing with Niaspan. His most recent lipid profile performed  02/13/2018 revealed total cholesterol 137, LDL 62 and HDL 39.  Since I saw him a year ago he is doing well and is asymptomatic.  He specifically denies chest pain or shortness of breath.  He had right total knee replacement by Dr. Ricki Acosta late last year and still needs his left knee done after the first the year.  Current Meds  Medication Sig  . amLODipine (NORVASC) 10 MG tablet Take 1 tablet (10 mg total) by mouth daily.  Marland Kitchen atorvastatin (LIPITOR) 10 MG tablet Take 1 tablet (10 mg total) by mouth daily.  . Blood Glucose Monitoring Suppl (ACCU-CHEK AVIVA PLUS) w/Device KIT Test blood sugar 3 times daily. Dx code: E11.22  . ferrous sulfate 325 (65 FE) MG tablet Take 325 mg by mouth daily with breakfast.  . glucose blood (ACCU-CHEK AVIVA PLUS) test strip Test blood sugar 3 times daily. Dx code: E11.22  . HYDROcodone-acetaminophen (NORCO) 5-325 MG tablet Take 1 tablet by mouth every 6 (six) hours as  needed.  . insulin NPH-regular Human (NOVOLIN 70/30) (70-30) 100 UNIT/ML injection ADMINISTER 8 UNITS UNDER THE SKIN TWICE DAILY WITH A MEAL  . Insulin Syringe-Needle U-100 (B-D INS SYR HALF-UNIT .3CC/31G) 31G X 5/16" 0.3 ML MISC Use daily at bedtime to inject insulin. Dx code: 250.00  . Lancets (ACCU-CHEK MULTICLIX) lancets Test blood sugar 3 times daily. Dx code: E11.22  . lisinopril (ZESTRIL) 20 MG tablet Take 1 tablet (20 mg total) by mouth daily.  Marland Kitchen lisinopril-hydrochlorothiazide (ZESTORETIC) 20-25 MG tablet Take 1 tablet by mouth daily.  . metFORMIN (GLUCOPHAGE) 1000 MG tablet Take 1 tablet (1,000 mg total) by mouth 2 (two) times daily with a meal.  . metoprolol tartrate (LOPRESSOR) 50 MG tablet Take 1 tablet (50 mg total) by mouth 2 (two) times daily.  . mirabegron ER (MYRBETRIQ) 25 MG TB24 tablet Take 25 mg by mouth daily.  . Multiple Vitamin (MULTIVITAMIN) tablet Take 1 tablet by mouth daily. COMPLETE SENIOR VITAMINS AND M  . Omega-3 Fatty Acids (FISH OIL) 1000 MG CAPS Take 2 capsules by mouth daily.   . Vitamin D, Cholecalciferol, 1000 UNITS TABS Take 1 tablet by mouth daily.      Allergies  Allergen Reactions  . Contrast Media [Iodinated Diagnostic Agents] Swelling and Other (See Comments)    Possible neck swelling after contrast for chest CT 03/2016. No hives, throat or other symptoms    Social History   Socioeconomic History  . Marital status: Legally Separated    Spouse name: Not on file  .  Number of children: 7  . Years of education: Not on file  . Highest education level: Not on file  Occupational History  . Occupation: RETIRED    Employer: RETIRED  Social Needs  . Financial resource strain: Not on file  . Food insecurity    Worry: Not on file    Inability: Not on file  . Transportation needs    Medical: Not on file    Non-medical: Not on file  Tobacco Use  . Smoking status: Former Smoker    Packs/day: 0.00    Years: 15.00    Pack years: 0.00    Types:  Cigarettes    Quit date: 05/10/2017    Years since quitting: 1.7  . Smokeless tobacco: Never Used  . Tobacco comment: Patient smokes occasionally. Not everyday.  Substance and Sexual Activity  . Alcohol use: No  . Drug use: No  . Sexual activity: Never  Lifestyle  . Physical activity    Days per week: Not on file    Minutes per session: Not on file  . Stress: Not on file  Relationships  . Social Herbalist on phone: Not on file    Gets together: Not on file    Attends religious service: Not on file    Active member of club or organization: Not on file    Attends meetings of clubs or organizations: Not on file    Relationship status: Not on file  . Intimate partner violence    Fear of current or ex partner: Not on file    Emotionally abused: Not on file    Physically abused: Not on file    Forced sexual activity: Not on file  Other Topics Concern  . Not on file  Social History Narrative   29 grandchildren. Single. Education: 12 th grade. Exercise: 3-4 times a week for 30 minutes working out and set ups.     Review of Systems: General: negative for chills, fever, night sweats or weight changes.  Cardiovascular: negative for chest pain, dyspnea on exertion, edema, orthopnea, palpitations, paroxysmal nocturnal dyspnea or shortness of breath Dermatological: negative for rash Respiratory: negative for cough or wheezing Urologic: negative for hematuria Abdominal: negative for nausea, vomiting, diarrhea, bright red blood per rectum, melena, or hematemesis Neurologic: negative for visual changes, syncope, or dizziness All other systems reviewed and are otherwise negative except as noted above.    Blood pressure (!) 197/79, pulse 87, temperature 97.7 F (36.5 C), height 6' (1.829 m), weight 235 lb 9.6 oz (106.9 kg), SpO2 95 %.  General appearance: alert and no distress Neck: no adenopathy, no carotid bruit, no JVD, supple, symmetrical, trachea midline and thyroid not  enlarged, symmetric, no tenderness/mass/nodules Lungs: clear to auscultation bilaterally Heart: regular rate and rhythm, S1, S2 normal, no murmur, click, rub or gallop Extremities: extremities normal, atraumatic, no cyanosis or edema Pulses: 2+ and symmetric Skin: Skin color, texture, turgor normal. No rashes or lesions Neurologic: Alert and oriented X 3, normal strength and tone. Normal symmetric reflexes. Normal coordination and gait  EKG not performed today  ASSESSMENT AND PLAN:   CAD, NATIVE VESSEL History of CAD status post LAD stenting with a Promus drug-eluting stent by myself October 2009.  He is remained asymptomatic since denying chest pain or shortness of breath.  VENTRICULAR TACHYCARDIA History of ventricular tachycardia and symptomatic PVCs status post VT ablation by Dr. Rayann Heman which was aborted because of inappropriate substrate.  He however has remained asymptomatic in  regard to this.  Essential hypertension, benign History of essential hypertension with blood pressure measured today at 197/79.  He is on amlodipine lisinopril and metoprolol.  He thinks his blood pressure is up because he had 4 cups of coffee this morning.  Hyperlipidemia History of hyperlipidemia on statin therapy with lipid profile performed 02/13/2018 revealing total cholesterol 137, LDL 62 and HDL 39.  We will recheck a lipid liver profile this morning      Robert Harp MD [facility], [facility] 02/09/2019 9:12 AM

## 2019-02-09 NOTE — Assessment & Plan Note (Signed)
History of ventricular tachycardia and symptomatic PVCs status post VT ablation by Dr. Rayann Heman which was aborted because of inappropriate substrate.  He however has remained asymptomatic in regard to this.

## 2019-02-09 NOTE — Assessment & Plan Note (Signed)
History of hyperlipidemia on statin therapy with lipid profile performed 02/13/2018 revealing total cholesterol 137, LDL 62 and HDL 39.  We will recheck a lipid liver profile this morning

## 2019-02-09 NOTE — Assessment & Plan Note (Signed)
History of essential hypertension with blood pressure measured today at 197/79.  He is on amlodipine lisinopril and metoprolol.  He thinks his blood pressure is up because he had 4 cups of coffee this morning.

## 2019-02-09 NOTE — Patient Instructions (Signed)
Medication Instructions:  Your physician recommends that you continue on your current medications as directed. Please refer to the Current Medication list given to you today.  If you need a refill on your cardiac medications before your next appointment, please call your pharmacy.   Lab work: Lipid and Hepatic Function If you have labs (blood work) drawn today and your tests are completely normal, you will receive your results only by: MyChart Message (if you have MyChart) OR A paper copy in the mail If you have any lab test that is abnormal or we need to change your treatment, we will call you to review the results.  Testing/Procedures: NONE  Follow-Up: At [facility], you and your health needs are our priority.  As part of our continuing mission to provide you with exceptional heart care, we have created designated Provider Care Teams.  These Care Teams include your primary Cardiologist (physician) and Advanced Practice Providers (APPs -  Physician Assistants and Nurse Practitioners) who all work together to provide you with the care you need, when you need it. You may see Quay Burow, MD or one of the following Advanced Practice Providers on your designated Care Team:    Kerin Ransom, PA-C  [place], [place]  Coletta Memos, [place]   Your physician wants you to follow-up in: 1 year. You will receive a reminder letter in the mail two months in advance. If you don't receive a letter, please call our office to schedule the follow-up appointment.

## 2019-02-09 NOTE — Assessment & Plan Note (Signed)
History of CAD status post LAD stenting with a Promus drug-eluting stent by myself October 2009.  He is remained asymptomatic since denying chest pain or shortness of breath.

## 2019-02-10 ENCOUNTER — Encounter: Payer: Self-pay | Admitting: *Deleted

## 2019-02-15 ENCOUNTER — Ambulatory Visit
Admission: RE | Admit: 2019-02-15 | Discharge: 2019-02-15 | Disposition: A | Discharge status: Home / Self Care | Payer: Medicare HMO | Source: Ambulatory Visit | Attending: Registered Nurse | Admitting: Registered Nurse

## 2019-02-15 DIAGNOSIS — N1832 Chronic kidney disease, stage 3b: Secondary | ICD-10-CM

## 2019-02-15 DIAGNOSIS — N189 Chronic kidney disease, unspecified: Secondary | ICD-10-CM | POA: Diagnosis not present

## 2019-02-24 ENCOUNTER — Telehealth: Payer: Self-pay | Admitting: Family Medicine

## 2019-02-24 NOTE — Telephone Encounter (Signed)
Please schedule virtual visit in next week if possible to discuss diabetes medications.  I did receive a note from his nephrologist requesting we change Metformin or at least to minimize the dosing.  We will need to look at changing other medications or adding meds as well.  Thanks.

## 2019-03-01 ENCOUNTER — Other Ambulatory Visit: Payer: Self-pay

## 2019-03-01 ENCOUNTER — Telehealth (INDEPENDENT_AMBULATORY_CARE_PROVIDER_SITE_OTHER): Payer: Medicare HMO | Admitting: Family Medicine

## 2019-03-01 ENCOUNTER — Encounter: Payer: Self-pay | Admitting: Family Medicine

## 2019-03-01 VITALS — BP 174/84 | HR 78 | Ht 72.0 in | Wt 224.0 lb

## 2019-03-01 DIAGNOSIS — R809 Proteinuria, unspecified: Secondary | ICD-10-CM | POA: Diagnosis not present

## 2019-03-01 DIAGNOSIS — Z794 Long term (current) use of insulin: Secondary | ICD-10-CM | POA: Diagnosis not present

## 2019-03-01 DIAGNOSIS — N183 Chronic kidney disease, stage 3 unspecified: Secondary | ICD-10-CM | POA: Diagnosis not present

## 2019-03-01 DIAGNOSIS — E1129 Type 2 diabetes mellitus with other diabetic kidney complication: Secondary | ICD-10-CM

## 2019-03-01 MED ORDER — METFORMIN HCL 500 MG PO TABS: 500.0000 mg | ORAL_TABLET | Freq: Every day | ORAL | 1 refills | Status: DC

## 2019-03-01 NOTE — Progress Notes (Signed)
Virtual Visit via Telephone Note  I connected with [name] on 03/01/19 at 5:22 PM by telephone and verified that I am speaking with the correct person using two identifiers.   I discussed the limitations, risks, security and privacy concerns of performing an evaluation and management service by telephone and the availability of in person appointments. I also discussed with the patient that there may be a patient responsible charge related to this service. The patient expressed understanding and agreed to proceed, consent obtained  Chief complaint: Diabetes, med review.   History of Present Illness: [name] is a 85 y.o. male  Diabetes: Insulin-dependent with complications of chronic kidney disease and microalbuminuria. Has been evaluated by [facility], Dr. Royce Macadamia.  Recent notation recommending reducing his Metformin dose to no more than 500 mg daily, or alternative agent given his chronic kidney disease. Last A1c 7.4 in October on 8 units of 70/30 twice daily, Metformin 1000 mg twice daily.  Home readings:  Last week - 96 fasting. No symptomatic lows.   Lab Results  Component Value Date   HGBA1C 7.4 (H) 01/18/2019   HGBA1C 7.0 (H) 10/19/2018   HGBA1C 6.8 (A) 06/11/2018   Lab Results  Component Value Date   MICROALBUR 83.3 05/17/2015   LDLCALC 55 02/09/2019   CREATININE 1.80 (H) 01/18/2019      Patient Active Problem List   Diagnosis Date Noted  . Osteoarthritis of right knee 03/04/2018  . Rib pain on left side 07/01/2016  . Fall 12/14/2015  . Elevated serum creatinine 12/14/2015  . Ileus ([place]) 12/14/2015  . Multiple fractures of ribs of left side 12/12/2015  . Prostate cancer ([place]) 05/26/2014  . Blood in the urine 12/01/2013  . Cystitis, radiation 12/01/2013  . Hematuria 12/01/2013  . 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 Malignant neoplasm of prostate ([place]) 11/26/2013  . Male erectile dysfunction 11/26/2013  . Pulsatile tinnitus  11/19/2013  . Rectal bleeding 12/23/2012  . Type II or unspecified type diabetes mellitus without mention of complication, not stated as uncontrolled   . Essential hypertension, benign   . Chronic back pain   . ED (erectile dysfunction)   . Anemia   . Hyperlipidemia   . CAD, NATIVE VESSEL 05/08/2008  . VENTRICULAR TACHYCARDIA 05/08/2008  . PREMATURE VENTRICULAR CONTRACTIONS 05/08/2008   Past Medical History:  Diagnosis Date  . Allergy   . CAD (coronary artery disease)    pci to LAD 10/09; stable CAD by cath 05/31/08; myoview 04/11/11- no ishcemia, EF 67%; echo 05/15/09- EF>55%, mod calcification of the aortic valve leaflets  . Chronic back pain   . ED (erectile dysfunction)   . Essential hypertension, benign   . Hyperlipidemia   . Multiple rib fractures    left 9th, 10th, and 11th posterior rib fractures S/P fall 12/09/2015/notes 12/12/2015  . Prostate cancer ([place])    s/p  . Type II or unspecified type diabetes mellitus without mention of complication, not stated as uncontrolled   . Ventricular tachycardia ([place])    resuscitated; monitor 05/2008; attempted T ablation 2/10- aborted due to inappropriate substrate   Past Surgical History:  Procedure Laterality Date  . CARDIAC CATHETERIZATION  05/31/08   EF 55-60%, stable lesions, medical therapy  . COLECTOMY  '80's   polyps  . CORONARY ANGIOPLASTY WITH STENT PLACEMENT  01/19/08   PCI stent to mid LAD with Promus DES 27.5x28  . IR GENERIC HISTORICAL  01/03/2016   IR THORACENTESIS ASP PLEURAL SPACE W/IMG GUIDE 01/03/2016  [facility]-INTERV RAD  . KNEE ARTHROPLASTY Right 03/04/2018   Procedure: COMPUTER ASSISTED TOTAL KNEE ARTHROPLASTY;  Surgeon: Rod Can, MD;  Location: [facility];  Service: Orthopedics;  Laterality: Right;  . PTCA  01/2008   Allergies  Allergen Reactions  . Contrast Media [Iodinated Diagnostic Agents] Swelling and Other (See Comments)    Possible neck swelling after contrast for chest CT 03/2016. No hives, throat or other  symptoms   Prior to Admission medications   Medication Sig Start Date End Date Taking? Authorizing Provider  amLODipine (NORVASC) 10 MG tablet Take 1 tablet (10 mg total) by mouth daily. 10/19/18  Yes [name], MD  atorvastatin (LIPITOR) 10 MG tablet Take 1 tablet (10 mg total) by mouth daily. 10/19/18  Yes [name], MD  Blood Glucose Monitoring Suppl (ACCU-CHEK AVIVA PLUS) w/Device KIT Test blood sugar 3 times daily. Dx code: E11.22 04/03/16  Yes [name], MD  glucose blood (ACCU-CHEK AVIVA PLUS) test strip Test blood sugar 3 times daily. Dx code: E11.22 04/03/16  Yes [name], MD  insulin NPH-regular Human (NOVOLIN 70/30) (70-30) 100 UNIT/ML injection ADMINISTER 8 UNITS UNDER THE SKIN TWICE DAILY WITH A MEAL 10/19/18  Yes [name], MD  Insulin Syringe-Needle U-100 (B-D INS SYR HALF-UNIT .3CC/31G) 31G X 5/16" 0.3 ML MISC Use daily at bedtime to inject insulin. Dx code: 250.00 06/21/13  Yes [name], MD  Lancets (ACCU-CHEK MULTICLIX) lancets Test blood sugar 3 times daily. Dx code: E11.22 04/03/16  Yes [name], MD  lisinopril (ZESTRIL) 20 MG tablet Take 1 tablet (20 mg total) by mouth daily. 10/19/18  Yes [name], MD  lisinopril-hydrochlorothiazide (ZESTORETIC) 20-25 MG tablet Take 1 tablet by mouth daily. 10/19/18  Yes [name], MD  metFORMIN (GLUCOPHAGE) 1000 MG tablet Take 1 tablet (1,000 mg total) by mouth 2 (two) times daily with a meal. 10/19/18  Yes [name], MD  metoprolol tartrate (LOPRESSOR) 50 MG tablet Take 1 tablet (50 mg total) by mouth 2 (two) times daily. 10/19/18  Yes [name], MD  Multiple Vitamin (MULTIVITAMIN) tablet Take 1 tablet by mouth daily. COMPLETE SENIOR VITAMINS AND M   Yes [provider]  Omega-3 Fatty Acids (FISH OIL) 1000 MG CAPS Take 2 capsules by mouth daily.    Yes [provider]  Vitamin D, Cholecalciferol, 1000 UNITS TABS Take 1 tablet by mouth daily.     Yes [provider]  ferrous sulfate 325 (65 FE) MG tablet Take 325 mg by mouth daily with breakfast.    [provider]  HYDROcodone-acetaminophen (NORCO) 5-325 MG tablet Take 1 tablet by mouth every 6 (six) hours as needed. Patient not taking: Reported on 03/01/2019 11/04/18   Horald Pollen, MD  mirabegron ER (MYRBETRIQ) 25 MG TB24 tablet Take 25 mg by mouth daily.    [provider]   Social History   Socioeconomic History  . Marital status: Legally Separated    Spouse name: Not on file  . Number of children: 7  . Years of education: Not on file  . Highest education level: Not on file  Occupational History  . Occupation: RETIRED    Employer: RETIRED  Social Needs  . Financial resource strain: Not on file  . Food insecurity    Worry: Not on file    Inability: Not on file  . Transportation needs    Medical: Not on file    Non-medical: Not on file  Tobacco Use  . Smoking status: Former Smoker    Packs/day: 0.00    Years: 15.00    Pack years: 0.00    Types: Cigarettes    Quit date: 05/10/2017    Years since quitting: 1.8  . Smokeless tobacco: Never Used  . Tobacco comment: Patient smokes occasionally. Not everyday.  Substance and Sexual Activity  . Alcohol use: No  . Drug use: No  . Sexual activity: Never  Lifestyle  . Physical activity    Days per week: Not on file    Minutes per session: Not on file  . Stress: Not on file  Relationships  . Social Herbalist on phone: Not on file    Gets together: Not on file    Attends religious service: Not on file    Active member of club or organization: Not on file    Attends meetings of clubs or organizations: Not on file    Relationship status: Not on file  . Intimate partner violence    Fear of current or ex partner: Not on file    Emotionally abused: Not on file    Physically abused: Not on file    Forced sexual activity: Not on file  Other Topics Concern  . Not on file   Social History Narrative   29 grandchildren. Single. Education: 12 th grade. Exercise: 3-4 times a week for 30 minutes working out and set ups.    Observations/Objective:  No distress.  Assessment and Plan: Type 2 diabetes mellitus with microalbuminuria, with long-term current use of insulin ([place]) - Plan: metFORMIN (GLUCOPHAGE) 500 MG tablet  Stage 3 chronic kidney disease, unspecified whether stage 3a or 3b CKD - Plan: metFORMIN (GLUCOPHAGE) 500 MG tablet   With chronic kidney disease will decrease dose of Metformin to 500 mg daily.  Currently will cut back to one of his 1000 mg until mail-order sends new supply.  Continue same dose of insulin for now given relatively low reading as above.  Advised to check fasting or 2-hour postprandial once per day with record of readings to discuss in 2 weeks at telemedicine visit.  Can decide on either insulin doses changes at that time, or change of agent.  Follow Up Instructions: 2 weeks. Telemed   I discussed the assessment and treatment plan with the patient. The patient was provided an opportunity to ask questions and all were answered. The patient agreed with the plan and demonstrated an understanding of the instructions.   The patient was advised to call back or seek an in-person evaluation if the symptoms worsen or if the condition fails to improve as anticipated.  I provided 8 minutes of non-face-to-face time during this encounter.  Signed,   [name], MD [facility].  03/01/19

## 2019-03-10 ENCOUNTER — Other Ambulatory Visit (HOSPITAL_COMMUNITY): Payer: Self-pay | Admitting: Nephrology

## 2019-03-10 ENCOUNTER — Telehealth (HOSPITAL_COMMUNITY): Payer: Self-pay

## 2019-03-10 DIAGNOSIS — I129 Hypertensive chronic kidney disease with stage 1 through stage 4 chronic kidney disease, or unspecified chronic kidney disease: Secondary | ICD-10-CM

## 2019-03-10 NOTE — Telephone Encounter (Signed)

## 2019-03-11 ENCOUNTER — Other Ambulatory Visit: Payer: Self-pay

## 2019-03-11 ENCOUNTER — Ambulatory Visit (HOSPITAL_COMMUNITY)
Admission: RE | Admit: 2019-03-11 | Discharge: 2019-03-11 | Disposition: A | Discharge status: Home / Self Care | Payer: Medicare HMO | Source: Ambulatory Visit | Attending: Family | Admitting: Family

## 2019-03-11 DIAGNOSIS — I129 Hypertensive chronic kidney disease with stage 1 through stage 4 chronic kidney disease, or unspecified chronic kidney disease: Secondary | ICD-10-CM

## 2019-03-17 ENCOUNTER — Other Ambulatory Visit: Payer: Self-pay

## 2019-03-17 ENCOUNTER — Telehealth (INDEPENDENT_AMBULATORY_CARE_PROVIDER_SITE_OTHER): Payer: Medicare HMO | Admitting: Family Medicine

## 2019-03-17 DIAGNOSIS — R809 Proteinuria, unspecified: Secondary | ICD-10-CM

## 2019-03-17 DIAGNOSIS — Z794 Long term (current) use of insulin: Secondary | ICD-10-CM | POA: Diagnosis not present

## 2019-03-17 DIAGNOSIS — E1129 Type 2 diabetes mellitus with other diabetic kidney complication: Secondary | ICD-10-CM | POA: Diagnosis not present

## 2019-03-17 DIAGNOSIS — N183 Chronic kidney disease, stage 3 unspecified: Secondary | ICD-10-CM

## 2019-03-17 NOTE — Patient Instructions (Signed)
Decrease insulin to 7 units twice per day. Stay at 545m metformin once per day.  If any symptomatic low blood sugars, follow up with me right away.  See information below on hypoglycemia precautions.   Recheck in 6 weeks - telemedicine visit ok, but have lab only visit the week before so we can see your A1C reading.   Return to the clinic or go to the nearest emergency room if any of your symptoms worsen or new symptoms occur.    Hypoglycemia Hypoglycemia is when the sugar (glucose) level in your blood is too low. Signs of low blood sugar may include:  Feeling: ? Hungry. ? Worried or nervous (anxious). ? Sweaty and clammy. ? Confused. ? Dizzy. ? Sleepy. ? Sick to your stomach (nauseous).  Having: ? A fast heartbeat. ? A headache. ? A change in your vision. ? Tingling or no feeling (numbness) around your mouth, lips, or tongue. ? Jerky movements that you cannot control (seizure).  Having trouble with: ? Moving (coordination). ? Sleeping. ? Passing out (fainting). ? Getting upset easily (irritability). Low blood sugar can happen to people who have diabetes and people who do not have diabetes. Low blood sugar can happen quickly, and it can be an emergency. Treating low blood sugar Low blood sugar is often treated by eating or drinking something sugary right away, such as:  Fruit juice, 4-6 oz (120-150 mL).  Regular soda (not diet soda), 4-6 oz (120-150 mL).  Low-fat milk, 4 oz (120 mL).  Several pieces of hard candy.  Sugar or honey, 1 Tbsp (15 mL). Treating low blood sugar if you have diabetes If you can think clearly and swallow safely, follow the 15:15 rule:  Take 15 grams of a fast-acting carb (carbohydrate). Talk with your doctor about how much you should take.  Always keep a source of fast-acting carb with you, such as: ? Sugar tablets (glucose pills). Take 3-4 pills. ? 6-8 pieces of hard candy. ? 4-6 oz (120-150 mL) of fruit juice. ? 4-6 oz (120-150 mL) of  regular (not diet) soda. ? 1 Tbsp (15 mL) honey or sugar.  Check your blood sugar 15 minutes after you take the carb.  If your blood sugar is still at or below 70 mg/dL (3.9 mmol/L), take 15 grams of a carb again.  If your blood sugar does not go above 70 mg/dL (3.9 mmol/L) after 3 tries, get help right away.  After your blood sugar goes back to normal, eat a meal or a snack within 1 hour.  Treating very low blood sugar If your blood sugar is at or below 54 mg/dL (3 mmol/L), you have very low blood sugar (severe hypoglycemia). This may also cause:  Passing out.  Jerky movements you cannot control (seizure).  Losing consciousness (coma). This is an emergency. Do not wait to see if the symptoms will go away. Get medical help right away. Call your local emergency services (911 in the [place] Do not drive yourself to the hospital. If you have very low blood sugar and you cannot eat or drink, you may need a glucagon shot (injection). A family member or friend should learn how to check your blood sugar and how to give you a glucagon shot. Ask your doctor if you need to have a glucagon shot kit at home. Follow these instructions at home: General instructions  Take over-the-counter and prescription medicines only as told by your doctor.  Stay aware of your blood sugar as told by your  doctor.  Limit alcohol intake to no more than 1 drink a day for nonpregnant women and 2 drinks a day for men. One drink equals 12 oz of beer (355 mL), 5 oz of wine (148 mL), or 1 oz of hard liquor (44 mL).  Keep all follow-up visits as told by your doctor. This is important. If you have diabetes:   Follow your diabetes care plan as told by your doctor. Make sure you: ? Know the signs of low blood sugar. ? Take your medicines as told. ? Follow your exercise and meal plan. ? Eat on time. Do not skip meals. ? Check your blood sugar as often as told by your doctor. Always check it before and after exercise. ?  Follow your sick day plan when you cannot eat or drink normally. Make this plan ahead of time with your doctor.  Share your diabetes care plan with: ? Your work or school. ? People you live with.  Check your pee (urine) for ketones: ? When you are sick. ? As told by your doctor.  Carry a card or wear jewelry that says you have diabetes. Contact a doctor if:  You have trouble keeping your blood sugar in your target range.  You have low blood sugar often. Get help right away if:  You still have symptoms after you eat or drink something sugary.  Your blood sugar is at or below 54 mg/dL (3 mmol/L).  You have jerky movements that you cannot control.  You pass out. These symptoms may be an emergency. Do not wait to see if the symptoms will go away. Get medical help right away. Call your local emergency services (911 in the [place] Do not drive yourself to the hospital. Summary  Hypoglycemia happens when the level of sugar (glucose) in your blood is too low.  Low blood sugar can happen to people who have diabetes and people who do not have diabetes. Low blood sugar can happen quickly, and it can be an emergency.  Make sure you know the signs of low blood sugar and know how to treat it.  Always keep a source of sugar (fast-acting carb) with you to treat low blood sugar. This information is not intended to replace advice given to you by your health care provider. Make sure you discuss any questions you have with your health care provider. Document Released: 06/19/2009 Document Revised: 07/16/2018 Document Reviewed: 04/28/2015 Elsevier Patient Education  2020 [employer].

## 2019-03-17 NOTE — Progress Notes (Signed)
Virtual Visit via Telephone Note  I connected with [name] on 03/17/19 at 12:21 PM by telephone and verified that I am speaking with the correct person using two identifiers.   I discussed the limitations, risks, security and privacy concerns of performing an evaluation and management service by telephone and the availability of in person appointments. I also discussed with the patient that there may be a patient responsible charge related to this service. The patient expressed understanding and agreed to proceed, consent obtained  Chief complaint: Diabetes  History of Present Illness: [name] is a 85 y.o. male  Diabetes: Complicated by microalbuminuria, CKD, with long-term current use of insulin. Previously on Metformin 1000 mg twice daily.  With chronic kidney disease was evaluated by nephrology.  Recommendation to decrease to 500 mg daily or off Metformin completely given decreased GFR.  Telemedicine visit November 23, decrease Metformin 500 mg daily.  Decided to remain on same dose of 70/30 insulin at 8 units twice per day as he reported blood sugars in the 90s at home at times.  Has been taking metformin 58m qd. Still on 8 units insulin BID.  Home readings:  Fasting, 96, 80, 90, 92, 93, 97, 87, 93 Postprandial: 96, 98 -106 2 hr postprandial Denies symptomatic lows - lowest 80. Highest 106.  Lab Results  Component Value Date   HGBA1C 7.4 (H) 01/18/2019     Patient Active Problem List   Diagnosis Date Noted  . Osteoarthritis of right knee 03/04/2018  . Rib pain on left side 07/01/2016  . Fall 12/14/2015  . Elevated serum creatinine 12/14/2015  . Ileus ([place] 12/14/2015  . Multiple fractures of ribs of left side 12/12/2015  . Prostate cancer ([place] 05/26/2014  . Blood in the urine 12/01/2013  . Cystitis, radiation 12/01/2013  . Hematuria 12/01/2013  . 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 Malignant ne[place] of prostate ([place] 11/26/2013  . Male erectile  dysfunction 11/26/2013  . Pulsatile tinnitus 11/19/2013  . Rectal bleeding 12/23/2012  . Type II or unspecified type diabetes mellitus without mention of complication, not stated as uncontrolled   . Essential hypertension, benign   . Chronic back pain   . ED (erectile dysfunction)   . Anemia   . Hyperlipidemia   . CAD, NATIVE VESSEL 05/08/2008  . VENTRICULAR TACHYCARDIA 05/08/2008  . PREMATURE VENTRICULAR CONTRACTIONS 05/08/2008   Past Medical History:  Diagnosis Date  . Allergy   . CAD (coronary artery disease)    pci to LAD 10/09; stable CAD by cath 05/31/08; myoview 04/11/11- no ishcemia, EF 67%; echo 05/15/09- EF>55%, mod calcification of the aortic valve leaflets  . Chronic back pain   . ED (erectile dysfunction)   . Essential hypertension, benign   . Hyperlipidemia   . Multiple rib fractures    left 9th, 10th, and 11th posterior rib fractures S/P fall 12/09/2015/notes 12/12/2015  . [place]state cancer ([place]    s/p  . Type II or unspecified type diabetes mellitus without mention of complication, not stated as uncontrolled   . Ventricular tachycardia ([place]    resuscitated; monitor 05/2008; attempted T ablation 2/10- aborted due to inappropriate substrate   Past Surgical History:  Procedure Laterality Date  . CARDIAC CATHETERIZATION  05/31/08   EF 55-60%, stable lesions, medical therapy  . COLECTOMY  '80's   polyps  . CORONARY ANGIOPLASTY WITH STENT PLACEMENT  01/19/08   PCI stent to mid LAD with Promus DES 27.5x28  . IR GENERIC HISTORICAL  01/03/2016  IR THORACENTESIS ASP PLEURAL SPACE W/IMG GUIDE 01/03/2016 [facility]  . KNEE ARTHROPLASTY Right 03/04/2018   Procedure: COMPUTER ASSISTED TOTAL KNEE ARTHROPLASTY;  Surgeon: Rod Can, MD;  Location: [facility];  Service: Orthopedics;  Laterality: Right;  . PTCA  01/2008   Allergies  Allergen Reactions  . Contrast Media [Iodinated Diagnostic Agents] Swelling and Other (See Comments)    Possible neck swelling after contrast for  chest CT 03/2016. No hives, throat or other symptoms   Prior to Admission medications   Medication Sig Start Date End Date Taking? Authorizing Provider  amLODipine (NORVASC) 10 MG tablet Take 1 tablet (10 mg total) by mouth daily. 10/19/18  Yes [name], MD  atorvastatin (LIPITOR) 10 MG tablet Take 1 tablet (10 mg total) by mouth daily. 10/19/18  Yes [name], MD  Blood Glucose Monitoring Suppl (ACCU-CHEK AVIVA PLUS) w/Device KIT Test blood sugar 3 times daily. Dx code: E11.22 04/03/16  Yes [name], MD  ferrous sulfate 325 (65 FE) MG tablet Take 325 mg by mouth daily with breakfast.   Yes [provider]  glucose blood (ACCU-CHEK AVIVA PLUS) test strip Test blood sugar 3 times daily. Dx code: E11.22 04/03/16  Yes [name], MD  insulin NPH-regular Human (NOVOLIN 70/30) (70-30) 100 UNIT/ML injection ADMINISTER 8 UNITS UNDER THE SKIN TWICE DAILY WITH A MEAL 10/19/18  Yes [name], MD  Insulin Syringe-Needle U-100 (B-D INS SYR HALF-UNIT .3CC/31G) 31G X 5/16" 0.3 ML MISC Use daily at bedtime to inject insulin. Dx code: 250.00 06/21/13  Yes [name], MD  Lancets (ACCU-CHEK MULTICLIX) lancets Test blood sugar 3 times daily. Dx code: E11.22 04/03/16  Yes [name], MD  lisinopril (ZESTRIL) 20 MG tablet Take 1 tablet (20 mg total) by mouth daily. 10/19/18  Yes [name], MD  lisinopril-hydrochlorothiazide (ZESTORETIC) 20-25 MG tablet Take 1 tablet by mouth daily. 10/19/18  Yes [name], MD  metFORMIN (GLUCOPHAGE) 500 MG tablet Take 1 tablet (500 mg total) by mouth daily with breakfast. 03/01/19  Yes [name], MD  metoprolol tartrate (LOPRESSOR) 50 MG tablet Take 1 tablet (50 mg total) by mouth 2 (two) times daily. 10/19/18  Yes [name], MD  mirabegron ER (MYRBETRIQ) 25 MG TB24 tablet Take 25 mg by mouth daily.   Yes [provider]  Multiple Vitamin (MULTIVITAMIN) tablet Take 1 tablet by mouth daily.  COMPLETE SENIOR VITAMINS AND M   Yes [provider]  Omega-3 Fatty Acids (FISH OIL) 1000 MG CAPS Take 2 capsules by mouth daily.    Yes [provider]  Vitamin D, Cholecalciferol, 1000 UNITS TABS Take 1 tablet by mouth daily.    Yes [provider]   Social History   Socioeconomic History  . Marital status: Legally Separated    Spouse name: Not on file  . Number of children: 7  . Years of education: Not on file  . Highest education level: Not on file  Occupational History  . Occupation: RETIRED    Employer: RETIRED  Social Needs  . Financial resource strain: Not on file  . Food insecurity    Worry: Not on file    Inability: Not on file  . Transportation needs    Medical: Not on file    Non-medical: Not on file  Tobacco Use  . Smoking status: Former Smoker    Packs/day: 0.00    Years: 15.00    Pack years: 0.00  Types: Cigarettes    Quit date: 05/10/2017    Years since quitting: 1.8  . Smokeless tobacco: Never Used  . Tobacco comment: Patient smokes occasionally. Not everyday.  Substance and Sexual Activity  . Alcohol use: No  . Drug use: No  . Sexual activity: Never  Lifestyle  . Physical activity    Days per week: Not on file    Minutes per session: Not on file  . Stress: Not on file  Relationships  . Social Herbalist on phone: Not on file    Gets together: Not on file    Attends religious service: Not on file    Active member of club or organization: Not on file    Attends meetings of clubs or organizations: Not on file    Relationship status: Not on file  . Intimate partner violence    Fear of current or ex partner: Not on file    Emotionally abused: Not on file    Physically abused: Not on file    Forced sexual activity: Not on file  Other Topics Concern  . Not on file  Social History Narrative   29 grandchildren. Single. Education: 12 th grade. Exercise: 3-4 times a week for 30 minutes working out and set ups.      Observations/Objective: No distress, appropriate responses.   Assessment and Plan: Stage 3 chronic kidney disease, unspecified whether stage 3a or 3b CKD  Type 2 diabetes mellitus with microalbuminuria, with long-term current use of insulin (HCC) - Plan: Hemoglobin A1c  Very well controlled based on home readings on lower dose of Metformin.  We will have him decrease insulin to 7 units twice per day to minimize risk of hypoglycemia although he has not had symptomatic hypoglycemia.  Recheck with A1c in the next 6 weeks, sooner if new symptoms.   Follow Up Instructions: 6 weeks.    I discussed the assessment and treatment plan with the patient. The patient was provided an opportunity to ask questions and all were answered. The patient agreed with the plan and demonstrated an understanding of the instructions.   The patient was advised to call back or seek an in-person evaluation if the symptoms worsen or if the condition fails to improve as anticipated.  I provided 6 minutes of non-face-to-face time during this encounter.  Signed,   [name], MD [facility].  03/17/19

## 2019-03-17 NOTE — Progress Notes (Signed)
Cc- Diabetes- 2 week f/u on diabetes. Patient stated his blood sugar this morning was 93. He will also check his bp and have that number When Dr Carlota Raspberry call. Last visit he was instructed to decrease metformin to 500 mg. Patient stated he is doing well

## 2019-03-18 ENCOUNTER — Other Ambulatory Visit: Payer: Self-pay | Admitting: Family Medicine

## 2019-03-18 DIAGNOSIS — I10 Essential (primary) hypertension: Secondary | ICD-10-CM

## 2019-03-18 DIAGNOSIS — E785 Hyperlipidemia, unspecified: Secondary | ICD-10-CM

## 2019-03-18 NOTE — Telephone Encounter (Signed)
Forwarding medication refill requests to the clinical pool for review. 

## 2019-04-22 ENCOUNTER — Other Ambulatory Visit: Payer: Self-pay

## 2019-04-22 ENCOUNTER — Ambulatory Visit (INDEPENDENT_AMBULATORY_CARE_PROVIDER_SITE_OTHER): Payer: Medicare HMO | Admitting: Family Medicine

## 2019-04-22 VITALS — BP 154/77 | HR 80 | Temp 98.0°F | Ht 72.0 in | Wt 236.6 lb

## 2019-04-22 DIAGNOSIS — E785 Hyperlipidemia, unspecified: Secondary | ICD-10-CM | POA: Diagnosis not present

## 2019-04-22 DIAGNOSIS — Z794 Long term (current) use of insulin: Secondary | ICD-10-CM | POA: Diagnosis not present

## 2019-04-22 DIAGNOSIS — E1129 Type 2 diabetes mellitus with other diabetic kidney complication: Secondary | ICD-10-CM

## 2019-04-22 DIAGNOSIS — I10 Essential (primary) hypertension: Secondary | ICD-10-CM | POA: Diagnosis not present

## 2019-04-22 DIAGNOSIS — R809 Proteinuria, unspecified: Secondary | ICD-10-CM

## 2019-04-22 DIAGNOSIS — N183 Chronic kidney disease, stage 3 unspecified: Secondary | ICD-10-CM | POA: Diagnosis not present

## 2019-04-22 DIAGNOSIS — E1122 Type 2 diabetes mellitus with diabetic chronic kidney disease: Secondary | ICD-10-CM | POA: Diagnosis not present

## 2019-04-22 LAB — HEMOGLOBIN A1C
Est. average glucose Bld gHb Est-mCnc: 200 mg/dL
Hgb A1c MFr Bld: 8.6 % — ABNORMAL HIGH (ref 4.8–5.6)

## 2019-04-22 LAB — BASIC METABOLIC PANEL
BUN/Creatinine Ratio: 14 (ref 10–24)
BUN: 24 mg/dL (ref 8–27)
CO2: 18 mmol/L — ABNORMAL LOW (ref 20–29)
Calcium: 9.1 mg/dL (ref 8.6–10.2)
Chloride: 105 mmol/L (ref 96–106)
Creatinine, Ser: 1.77 mg/dL — ABNORMAL HIGH (ref 0.76–1.27)
GFR calc Af Amer: 40 mL/min/{1.73_m2} — ABNORMAL LOW (ref >59)
GFR calc non Af Amer: 35 mL/min/{1.73_m2} — ABNORMAL LOW (ref >59)
Glucose: 143 mg/dL — ABNORMAL HIGH (ref 65–99)
Potassium: 4.7 mmol/L (ref 3.5–5.2)
Sodium: 138 mmol/L (ref 134–144)

## 2019-04-22 MED ORDER — AMLODIPINE BESYLATE 10 MG PO TABS: 10.0000 mg | ORAL_TABLET | Freq: Every day | ORAL | 1 refills | Status: DC

## 2019-04-22 MED ORDER — METFORMIN HCL 500 MG PO TABS: 500.0000 mg | ORAL_TABLET | Freq: Every day | ORAL | 1 refills | Status: DC

## 2019-04-22 MED ORDER — METOPROLOL TARTRATE 50 MG PO TABS: 50.0000 mg | ORAL_TABLET | Freq: Two times a day (BID) | ORAL | 1 refills | Status: DC

## 2019-04-22 MED ORDER — ATORVASTATIN CALCIUM 10 MG PO TABS: 10.0000 mg | ORAL_TABLET | Freq: Every day | ORAL | 1 refills | Status: DC

## 2019-04-22 MED ORDER — HYDRALAZINE HCL 25 MG PO TABS: 12.5000 mg | ORAL_TABLET | Freq: Three times a day (TID) | ORAL | 1 refills | Status: DC

## 2019-04-22 MED ORDER — NOVOLIN 70/30 (70-30) 100 UNIT/ML ~~LOC~~ SUSP: SUBCUTANEOUS | 5 refills | Status: DC

## 2019-04-22 MED ORDER — LISINOPRIL-HYDROCHLOROTHIAZIDE 20-25 MG PO TABS: 1.0000 | ORAL_TABLET | Freq: Every day | ORAL | 1 refills | Status: DC

## 2019-04-22 NOTE — Patient Instructions (Addendum)
  I do recommend covid vaccine.  COVID-19 Vaccine Information can be found at: ShippingScam.co.uk For questions related to vaccine distribution or appointments, please email vaccine@[email or web address] or call [phone].   Blood pressure is too high. I would like to discuss new medicine with nephrology. No changes for now.   If you have lab work done today you will be contacted with your lab results within the next 2 weeks.  If you have not heard from [place] then please contact us. The fastest way to get your results is to register for My Chart.   IF you received an x-ray today, you will receive an invoice from [facility]. Please contact [facility] at [phone] with questions or concerns regarding your invoice.   IF you received labwork today, you will receive an invoice from [facility]. Please contact [facility] at [phone] with questions or concerns regarding your invoice.   Our billing staff will not be able to assist you with questions regarding bills from these companies.  You will be contacted with the lab results as soon as they are available. The fastest way to get your results is to activate your My Chart account. Instructions are located on the last page of this paperwork. If you have not heard from [place] regarding the results in 2 weeks, please contact this office.

## 2019-04-22 NOTE — Progress Notes (Signed)
Subjective:  Patient ID: [name], male    DOB: [birth year]  Age: 86 y.o. MRN: [id number]  CC:  Chief Complaint  Patient presents with  . Follow-up    pt feels good and states he hasn't had any episodes of really high or really low Blood sugar.. pt had a blader test last month and pt would like to discus the results.    HPI [name] presents for   Diabetes: Complicated by microalbuminuria, chronic kidney disease.  Insulin-dependent. Evaluated by nephrology - Dr.Foster. recommendation to decrease or titrate off Metformin given his CKD.  Metformin decreased to 500 mg daily on November 23.  Remain at 70/30 insulin 8 units twice per day.  Currently stable on December 9 telemedicine visit in the 90s fasting, 90-106 2-hour postprandial, lowest 80 without symptomatic lows - decreased to 7 units insulin to lessen risk of lows.  He is on statin and ACE inhibitor. Renal Doppler without evidence of right or left renal artery stenosis on December 3.  Slightly smaller left kidney than right without acute abnormality on ultrasound November 9. Next nephrology eval 1/29 - repeat ultrasound at that time.  Urinating ok.  Home readings: fasting: 99, 100. No symptomatic lows. Lowest 95.  2hr PP - 115-120.  Now on 70/30 insulin 7 u BID, metformin 577m qd.  Plans to reschedule optho appt.    Microalbumin:  Lab Results  Component Value Date   LABMICR 497.3 10/20/2018   LABMICR 1,066.8 08/07/2017   MICROALBUR 83.3 05/17/2015   MICROALBUR 14.6 (H) 03/28/2014   Optho, foot exam, pneumovax: Due for ophthalmology exam, last in April 2019  Lab Results  Component Value Date   HGBA1C 8.6 (H) 04/22/2019   HGBA1C 7.4 (H) 01/18/2019   HGBA1C 7.0 (H) 10/19/2018   Lab Results  Component Value Date   MICROALBUR 83.3 05/17/2015   LDLCALC 55 02/09/2019   CREATININE 1.77 (H) 04/22/2019    Prostate cancer, bladder cancer, erectile dysfunction [facility]. -  appt every 6 months.  Bladder-status post TURBT plus chemo in July 2019, pathology low-grade TA. Prostate-Gleason score of 9 with extensive bilateral disease, negative bone scan, CT scan with a large pelvic lymph nodes present positives, responding to androgen deprivation therapy. Erections doing ok now.   covid counseling.  Discussed remaining at home as much as possible a this time, 3 W's., and plan for vaccine.   Hypertension: Amlodipine 10 mg daily, lisinopril 20 mg daily in addition to lisinopril HCTZ 20/25 mg daily, metoprolol 560mbid.  Home readings: up to 160/70's.  Cardiology app 02/09/19  Spoke with [facility] nephrology.  BP Readings from Last 3 Encounters:  04/22/19 (!) 154/77  03/01/19 (!) 174/84  02/09/19 (!) 164/72   Lab Results  Component Value Date   CREATININE 1.77 (H) 04/22/2019       History Patient Active Problem List   Diagnosis Date Noted  . Osteoarthritis of right knee 03/04/2018  . Rib pain on left side 07/01/2016  . Fall 12/14/2015  . Elevated serum creatinine 12/14/2015  . Ileus ([facility]/10/2015  . Multiple fractures of ribs of left side 12/12/2015  . Prostate cancer ([facility] [place]/18/2016  . Blood in the urine 12/01/2013  . Cystitis, radiation 12/01/2013  . Hematuria 12/01/2013  . 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 Malignant neoplasm of prostate ([facility] [place]08/21/2015  . Male erectile dysfunction 11/26/2013  . Pulsatile tinnitus 11/19/2013  . Rectal bleeding 12/23/2012  . Type II  or unspecified type diabetes mellitus without mention of complication, not stated as uncontrolled   . Essential hypertension, benign   . Chronic back pain   . ED (erectile dysfunction)   . Anemia   . Hyperlipidemia   . CAD, NATIVE VESSEL 05/08/2008  . VENTRICULAR TACHYCARDIA 05/08/2008  . PREMATURE VENTRICULAR CONTRACTIONS 05/08/2008   Past Medical History:  Diagnosis Date  . Allergy   . CAD (coronary artery disease)    pci to LAD 10/09; stable CAD by cath 05/31/08; myoview  04/11/11- no ishcemia, EF 67%; echo 05/15/09- EF>55%, mod calcification of the aortic valve leaflets  . Chronic back pain   . ED (erectile dysfunction)   . Essential hypertension, benign   . Hyperlipidemia   . Multiple rib fractures    left 9th, 10th, and 11th posterior rib fractures S/P fall 12/09/2015/notes 12/12/2015  . Prostate cancer ([place])    s/p  . Type II or unspecified type diabetes mellitus without mention of complication, not stated as uncontrolled   . Ventricular tachycardia ([place])    resuscitated; monitor 05/2008; attempted T ablation 2/10- aborted due to inappropriate substrate   Past Surgical History:  Procedure Laterality Date  . CARDIAC CATHETERIZATION  05/31/08   EF 55-60%, stable lesions, medical therapy  . COLECTOMY  '80's   polyps  . CORONARY ANGIOPLASTY WITH STENT PLACEMENT  01/19/08   PCI stent to mid LAD with Promus DES 27.5x28  . IR GENERIC HISTORICAL  01/03/2016   IR THORACENTESIS ASP PLEURAL SPACE W/IMG GUIDE 01/03/2016 [facility]-INTERV RAD  . KNEE ARTHROPLASTY Right 03/04/2018   Procedure: COMPUTER ASSISTED TOTAL KNEE ARTHROPLASTY;  Surgeon: Rod Can, MD;  Location: [facility];  Service: Orthopedics;  Laterality: Right;  . PTCA  01/2008   Allergies  Allergen Reactions  . Contrast Media [Iodinated Diagnostic Agents] Swelling and Other (See Comments)    Possible neck swelling after contrast for chest CT 03/2016. No hives, throat or other symptoms   Prior to Admission medications   Medication Sig Start Date End Date Taking? Authorizing Provider  amLODipine (NORVASC) 10 MG tablet Take 1 tablet (10 mg total) by mouth daily. 10/19/18  Yes [name], MD  atorvastatin (LIPITOR) 10 MG tablet TAKE 1 TABLET (10 MG TOTAL) BY MOUTH DAILY. 03/18/19  Yes [name], MD  Blood Glucose Monitoring Suppl (ACCU-CHEK AVIVA PLUS) w/Device KIT Test blood sugar 3 times daily. Dx code: E11.22 04/03/16  Yes [name], MD  ferrous sulfate 325 (65 FE) MG tablet Take 325 mg by  mouth daily with breakfast.   Yes [provider]  glucose blood (ACCU-CHEK AVIVA PLUS) test strip Test blood sugar 3 times daily. Dx code: E11.22 04/03/16  Yes [name], MD  insulin NPH-regular Human (NOVOLIN 70/30) (70-30) 100 UNIT/ML injection ADMINISTER 8 UNITS UNDER THE SKIN TWICE DAILY WITH A MEAL 10/19/18  Yes [name], MD  Insulin Syringe-Needle U-100 (B-D INS SYR HALF-UNIT .3CC/31G) 31G X 5/16" 0.3 ML MISC Use daily at bedtime to inject insulin. Dx code: 250.00 06/21/13  Yes [name], MD  Lancets (ACCU-CHEK MULTICLIX) lancets Test blood sugar 3 times daily. Dx code: E11.22 04/03/16  Yes [name], MD  lisinopril (ZESTRIL) 20 MG tablet TAKE 1 TABLET (20 MG TOTAL) BY MOUTH DAILY. 03/18/19  Yes [name], MD  lisinopril-hydrochlorothiazide (ZESTORETIC) 20-25 MG tablet TAKE 1 TABLET EVERY DAY 03/18/19  Yes [name], MD  metFORMIN (GLUCOPHAGE) 500 MG tablet Take 1 tablet (500 mg total)  by mouth daily with breakfast. 03/01/19  Yes [name], MD  metoprolol tartrate (LOPRESSOR) 50 MG tablet Take 1 tablet (50 mg total) by mouth 2 (two) times daily. 10/19/18  Yes [name], MD  mirabegron ER (MYRBETRIQ) 25 MG TB24 tablet Take 25 mg by mouth daily.    Yes [provider]  Multiple Vitamin (MULTIVITAMIN) tablet Take 1 tablet by mouth daily. COMPLETE SENIOR VITAMINS AND M   Yes [provider]  Omega-3 Fatty Acids (FISH OIL) 1000 MG CAPS Take 2 capsules by mouth daily.    Yes [provider]  Vitamin D, Cholecalciferol, 1000 UNITS TABS Take 1 tablet by mouth daily.    Yes [provider]   Social History   Socioeconomic History  . Marital status: Legally Separated    Spouse name: Not on file  . Number of children: 7  . Years of education: Not on file  . Highest education level: Not on file  Occupational History  . Occupation: RETIRED    Employer: RETIRED  Tobacco Use  . Smoking status:  Former Smoker    Packs/day: 0.00    Years: 15.00    Pack years: 0.00    Types: Cigarettes    Quit date: 05/10/2017    Years since quitting: 1.9  . Smokeless tobacco: Never Used  . Tobacco comment: Patient smokes occasionally. Not everyday.  Substance and Sexual Activity  . Alcohol use: No  . Drug use: No  . Sexual activity: Never  Other Topics Concern  . Not on file  Social History Narrative   29 grandchildren. Single. Education: 12 th grade. Exercise: 3-4 times a week for 30 minutes working out and set ups.   Social Determinants of Health   Financial Resource Strain:   . Difficulty of Paying Living Expenses: Not on file  Food Insecurity:   . Worried About Charity fundraiser in the Last Year: Not on file  . Ran Out of Food in the Last Year: Not on file  Transportation Needs:   . Lack of Transportation (Medical): Not on file  . Lack of Transportation (Non-Medical): Not on file  Physical Activity:   . Days of Exercise per Week: Not on file  . Minutes of Exercise per Session: Not on file  Stress:   . Feeling of Stress : Not on file  Social Connections:   . Frequency of Communication with Friends and Family: Not on file  . Frequency of Social Gatherings with Friends and Family: Not on file  . Attends Religious Services: Not on file  . Active Member of Clubs or Organizations: Not on file  . Attends Archivist Meetings: Not on file  . Marital Status: Not on file  Intimate Partner Violence:   . Fear of Current or Ex-Partner: Not on file  . Emotionally Abused: Not on file  . Physically Abused: Not on file  . Sexually Abused: Not on file    Review of Systems  Constitutional: Negative for fatigue and unexpected weight change.  Eyes: Negative for visual disturbance.  Respiratory: Negative for cough, chest tightness and shortness of breath.   Cardiovascular: Negative for chest pain, palpitations and leg swelling.  Gastrointestinal: Negative for abdominal pain and  blood in stool.  Neurological: Negative for dizziness, light-headedness and headaches.     Objective:   Vitals:   04/22/19 0911  BP: (!) 154/77  Pulse: 80  Temp: 98 F (36.7 C)  TempSrc: Temporal  SpO2: 98%  Weight: 236 lb 9.6 oz (107.3 kg)  Height: 6' (1.829 m)     Physical Exam Vitals reviewed.  Constitutional:      Appearance: He is well-developed.  HENT:     Head: Normocephalic and atraumatic.  Eyes:     Pupils: Pupils are equal, round, and reactive to light.  Neck:     Vascular: No carotid bruit or JVD.  Cardiovascular:     Rate and Rhythm: Normal rate and regular rhythm.     Heart sounds: Normal heart sounds. No murmur.  Pulmonary:     Effort: Pulmonary effort is normal.     Breath sounds: Normal breath sounds. No rales.  Skin:    General: Skin is warm and dry.  Neurological:     Mental Status: He is alert and oriented to person, place, and time.     Results for orders placed or performed in visit on 04/22/19  Hemoglobin A1c  Result Value Ref Range   Hgb A1c MFr Bld 8.6 (H) 4.8 - 5.6 %   Est. average glucose Bld gHb Est-mCnc 200 mg/dL  Basic metabolic panel  Result Value Ref Range   Glucose 143 (H) 65 - 99 mg/dL   BUN 24 8 - 27 mg/dL   Creatinine, Ser 1.77 (H) 0.76 - 1.27 mg/dL   GFR calc non Af Amer 35 (L) >59 mL/min/1.73   GFR calc Af Amer 40 (L) >59 mL/min/1.73   BUN/Creatinine Ratio 14 10 - 24   Sodium 138 134 - 144 mmol/L   Potassium 4.7 3.5 - 5.2 mmol/L   Chloride 105 96 - 106 mmol/L   CO2 18 (L) 20 - 29 mmol/L   Calcium 9.1 8.6 - 10.2 mg/dL      Assessment & Plan:  [name] is a 86 y.o. male . Stage 3 chronic kidney disease, unspecified whether stage 3a or 3b CKD - Plan: Basic metabolic panel, metFORMIN (GLUCOPHAGE) 500 MG tablet  -Continue plan to follow-up with nephrology, avoid higher dosing of Metformin at this time.  Nephrology appointment in 2 weeks.  After discussion with nephrologist, may potentially need to adjust  hydrochlorothiazide given CKD and possible volume status issues.  Type 2 diabetes mellitus with microalbuminuria, with long-term current use of insulin (HCC) - Plan: Hemoglobin E0C, Basic metabolic panel, insulin NPH-regular Human (NOVOLIN 70/30) (70-30) 100 UNIT/ML injection, metFORMIN (GLUCOPHAGE) 500 MG tablet  -Tolerating lower dose of 70/30 insulin, Metformin 500 mg daily.labs noted - discussed on follow up call. Increase insulin to 8 units twice per day as elevated A1c.   Essential hypertension - Plan: lisinopril-hydrochlorothiazide (ZESTORETIC) 20-25 MG tablet, amLODipine (NORVASC) 10 MG tablet, metoprolol tartrate (LOPRESSOR) 50 MG tablet, hydrALAZINE (APRESOLINE) 25 MG tablet  -Decreased control including home readings.  After discussion with nephrology will add hydralazine 12.5 mg 3 times daily with follow-up in 2 weeks as planned with nephrology for further adjustments.  No change in lisinopril/HCTZ/lisinopril, metoprolol, amlodipine at this time. Plan discussed on phone with understanding expressed.   Hyperlipidemia, unspecified hyperlipidemia type - Plan: atorvastatin (LIPITOR) 10 MG tablet  -Tolerating Lipitor, continue same.  Meds ordered this encounter  Medications  . insulin NPH-regular Human (NOVOLIN 70/30) (70-30) 100 UNIT/ML injection    Sig: ADMINISTER 8 UNITS UNDER THE SKIN TWICE DAILY WITH A MEAL    Dispense:  15 mL    Refill:  5  . lisinopril-hydrochlorothiazide (ZESTORETIC) 20-25 MG tablet    Sig: Take 1 tablet by mouth daily.    Dispense:  90  tablet    Refill:  1  . amLODipine (NORVASC) 10 MG tablet    Sig: Take 1 tablet (10 mg total) by mouth daily.    Dispense:  90 tablet    Refill:  1  . atorvastatin (LIPITOR) 10 MG tablet    Sig: Take 1 tablet (10 mg total) by mouth daily.    Dispense:  90 tablet    Refill:  1  . metoprolol tartrate (LOPRESSOR) 50 MG tablet    Sig: Take 1 tablet (50 mg total) by mouth 2 (two) times daily.    Dispense:  180 tablet     Refill:  1  . metFORMIN (GLUCOPHAGE) 500 MG tablet    Sig: Take 1 tablet (500 mg total) by mouth daily with breakfast.    Dispense:  90 tablet    Refill:  1  . hydrALAZINE (APRESOLINE) 25 MG tablet    Sig: Take 0.5 tablets (12.5 mg total) by mouth 3 (three) times daily.    Dispense:  45 tablet    Refill:  1   Patient Instructions    I do recommend covid vaccine.  COVID-19 Vaccine Information can be found at: ShippingScam.co.uk For questions related to vaccine distribution or appointments, please email vaccine@[email or web address] .[email or web address] or call [phone].   Blood pressure is too high. I would like to discuss new medicine with nephrology. No changes for now.   If you have lab work done today you will be contacted with your lab results within the next 2 weeks.  If you have not heard from [place] then please contact us. The fastest way to get your results is to register for My Chart.   IF you received an x-ray today, you will receive an invoice from [facility]. Please contact [facility] at [phone] with questions or concerns regarding your invoice.   IF you received labwork today, you will receive an invoice from [place]. Please contact [facility] at [phone] with questions or concerns regarding your invoice.   Our billing staff will not be able to assist you with questions regarding bills from these companies.  You will be contacted with the lab results as soon as they are available. The fastest way to get your results is to activate your My Chart account. Instructions are located on the last page of this paperwork. If you have not heard from [place] regarding the results in 2 weeks, please contact this office.         Signed, [name], MD [facility]

## 2019-04-24 ENCOUNTER — Encounter: Payer: Self-pay | Admitting: Family Medicine

## 2019-04-29 ENCOUNTER — Ambulatory Visit: Payer: Medicare HMO | Attending: Internal Medicine

## 2019-04-29 DIAGNOSIS — Z23 Encounter for immunization: Secondary | ICD-10-CM

## 2019-04-29 NOTE — Progress Notes (Signed)
   Covid-19 Vaccination Clinic  Name:  [name]    MRN: [id number] DOB: 27-Jun-1933  04/29/2019  Mr. [name] was observed post Covid-19 immunization for 15 minutes without incidence. He was provided with Vaccine Information Sheet and instruction to access the V-Safe system.   Mr. [name] was instructed to call 911 with any severe reactions post vaccine: Marland Kitchen Difficulty breathing  . Swelling of your face and throat  . A fast heartbeat  . A bad rash all over your body  . Dizziness and weakness    Immunizations Administered    Name Date Dose VIS Date Route   Pfizer COVID-19 Vaccine 04/29/2019 11:33 AM 0.3 mL 03/19/2019 Intramuscular   Manufacturer: [place]   Lot: [id number]   [place]: [id number]

## 2019-05-06 DIAGNOSIS — D631 Anemia in chronic kidney disease: Secondary | ICD-10-CM | POA: Diagnosis not present

## 2019-05-06 DIAGNOSIS — R809 Proteinuria, unspecified: Secondary | ICD-10-CM | POA: Diagnosis not present

## 2019-05-06 DIAGNOSIS — E1122 Type 2 diabetes mellitus with diabetic chronic kidney disease: Secondary | ICD-10-CM | POA: Diagnosis not present

## 2019-05-06 DIAGNOSIS — N1832 Chronic kidney disease, stage 3b: Secondary | ICD-10-CM | POA: Diagnosis not present

## 2019-05-06 DIAGNOSIS — I129 Hypertensive chronic kidney disease with stage 1 through stage 4 chronic kidney disease, or unspecified chronic kidney disease: Secondary | ICD-10-CM | POA: Diagnosis not present

## 2019-05-10 ENCOUNTER — Telehealth: Payer: Self-pay

## 2019-05-10 NOTE — Telephone Encounter (Signed)
[name] of [facility] called, Dr. Theresia Majors is suggesting that the metformin 500mg  be stopped due to gfr being 30.

## 2019-05-11 NOTE — Telephone Encounter (Signed)
Patient was informed and will be scheduling an appt in the next 10days

## 2019-05-11 NOTE — Telephone Encounter (Signed)
Noted.  Please call patient.  Have him stop Metformin, but schedule appointment by telemedicine within the next week to 10 days.  Last hemoglobin A1c had increased.  We will need to adjust medications further.  Have him check his blood sugars fasting and 2 hours after meals, at least 1 of those readings per day for that visit so we can decide on medication changes.  Thanks.

## 2019-05-14 ENCOUNTER — Telehealth (INDEPENDENT_AMBULATORY_CARE_PROVIDER_SITE_OTHER): Payer: Medicare HMO | Admitting: Family Medicine

## 2019-05-14 ENCOUNTER — Other Ambulatory Visit: Payer: Self-pay

## 2019-05-14 VITALS — Ht 72.0 in | Wt 233.0 lb

## 2019-05-14 DIAGNOSIS — E1129 Type 2 diabetes mellitus with other diabetic kidney complication: Secondary | ICD-10-CM | POA: Diagnosis not present

## 2019-05-14 DIAGNOSIS — R809 Proteinuria, unspecified: Secondary | ICD-10-CM | POA: Diagnosis not present

## 2019-05-14 DIAGNOSIS — N183 Chronic kidney disease, stage 3 unspecified: Secondary | ICD-10-CM | POA: Diagnosis not present

## 2019-05-14 DIAGNOSIS — Z794 Long term (current) use of insulin: Secondary | ICD-10-CM | POA: Diagnosis not present

## 2019-05-14 NOTE — Patient Instructions (Addendum)
Check blood sugar fasting, 2 hours after breakfast or lunch, and 2 hours after dinner.  Ok to pick one or two readings per day, but write them down for visit in week. No med changes other than stopping metformin for now.  See info on diabetes and nutrition below.    Diabetes Mellitus and Nutrition, Adult When you have diabetes (diabetes mellitus), it is very important to have healthy eating habits because your blood sugar (glucose) levels are greatly affected by what you eat and drink. Eating healthy foods in the appropriate amounts, at about the same times every day, can help you:  Control your blood glucose.  Lower your risk of heart disease.  Improve your blood pressure.  Reach or maintain a healthy weight. Every person with diabetes is different, and each person has different needs for a meal plan. Your health care provider may recommend that you work with a diet and nutrition specialist (dietitian) to make a meal plan that is best for you. Your meal plan may vary depending on factors such as:  The calories you need.  The medicines you take.  Your weight.  Your blood glucose, blood pressure, and cholesterol levels.  Your activity level.  Other health conditions you have, such as heart or kidney disease. How do carbohydrates affect me? Carbohydrates, also called carbs, affect your blood glucose level more than any other type of food. Eating carbs naturally raises the amount of glucose in your blood. Carb counting is a method for keeping track of how many carbs you eat. Counting carbs is important to keep your blood glucose at a healthy level, especially if you use insulin or take certain oral diabetes medicines. It is important to know how many carbs you can safely have in each meal. This is different for every person. Your dietitian can help you calculate how many carbs you should have at each meal and for each snack. Foods that contain carbs include:  Bread, cereal, rice, pasta,  and crackers.  Potatoes and corn.  Peas, beans, and lentils.  Milk and yogurt.  Fruit and juice.  Desserts, such as cakes, cookies, ice cream, and candy. How does alcohol affect me? Alcohol can cause a sudden decrease in blood glucose (hypoglycemia), especially if you use insulin or take certain oral diabetes medicines. Hypoglycemia can be a life-threatening condition. Symptoms of hypoglycemia (sleepiness, dizziness, and confusion) are similar to symptoms of having too much alcohol. If your health care provider says that alcohol is safe for you, follow these guidelines:  Limit alcohol intake to no more than 1 drink per day for nonpregnant women and 2 drinks per day for men. One drink equals 12 oz of beer, 5 oz of wine, or 1 oz of hard liquor.  Do not drink on an empty stomach.  Keep yourself hydrated with water, diet soda, or unsweetened iced tea.  Keep in mind that regular soda, juice, and other mixers may contain a lot of sugar and must be counted as carbs. What are tips for following this plan?  Reading food labels  Start by checking the serving size on the "Nutrition Facts" label of packaged foods and drinks. The amount of calories, carbs, fats, and other nutrients listed on the label is based on one serving of the item. Many items contain more than one serving per package.  Check the total grams (g) of carbs in one serving. You can calculate the number of servings of carbs in one serving by dividing the total carbs  by 15. For example, if a food has 30 g of total carbs, it would be equal to 2 servings of carbs.  Check the number of grams (g) of saturated and trans fats in one serving. Choose foods that have low or no amount of these fats.  Check the number of milligrams (mg) of salt (sodium) in one serving. Most people should limit total sodium intake to less than 2,300 mg per day.  Always check the nutrition information of foods labeled as "low-fat" or "nonfat". These foods  may be higher in added sugar or refined carbs and should be avoided.  Talk to your dietitian to identify your daily goals for nutrients listed on the label. Shopping  Avoid buying canned, premade, or processed foods. These foods tend to be high in fat, sodium, and added sugar.  Shop around the outside edge of the grocery store. This includes fresh fruits and vegetables, bulk grains, fresh meats, and fresh dairy. Cooking  Use low-heat cooking methods, such as baking, instead of high-heat cooking methods like deep frying.  Cook using healthy oils, such as olive, canola, or sunflower oil.  Avoid cooking with butter, cream, or high-fat meats. Meal planning  Eat meals and snacks regularly, preferably at the same times every day. Avoid going long periods of time without eating.  Eat foods high in fiber, such as fresh fruits, vegetables, beans, and whole grains. Talk to your dietitian about how many servings of carbs you can eat at each meal.  Eat 4-6 ounces (oz) of lean protein each day, such as lean meat, chicken, fish, eggs, or tofu. One oz of lean protein is equal to: ? 1 oz of meat, chicken, or fish. ? 1 egg. ?  cup of tofu.  Eat some foods each day that contain healthy fats, such as avocado, nuts, seeds, and fish. Lifestyle  Check your blood glucose regularly.  Exercise regularly as told by your health care provider. This may include: ? 150 minutes of moderate-intensity or vigorous-intensity exercise each week. This could be brisk walking, biking, or water aerobics. ? Stretching and doing strength exercises, such as yoga or weightlifting, at least 2 times a week.  Take medicines as told by your health care provider.  Do not use any products that contain nicotine or tobacco, such as cigarettes and e-cigarettes. If you need help quitting, ask your health care provider.  Work with a Social worker or diabetes educator to identify strategies to manage stress and any emotional and social  challenges. Questions to ask a health care provider  Do I need to meet with a diabetes educator?  Do I need to meet with a dietitian?  What number can I call if I have questions?  When are the best times to check my blood glucose? Where to find more information:  American Diabetes Association: diabetes.org  Academy of Nutrition and Dietetics: www.eatright.[employer] of Diabetes and Digestive and Kidney Diseases (NIH): [employer] Summary  A healthy meal plan will help you control your blood glucose and maintain a healthy lifestyle.  Working with a diet and nutrition specialist (dietitian) can help you make a meal plan that is best for you.  Keep in mind that carbohydrates (carbs) and alcohol have immediate effects on your blood glucose levels. It is important to count carbs and to use alcohol carefully. This information is not intended to replace advice given to you by your health care provider. Make sure you discuss any questions you have with your health care  provider. Document Revised: 03/07/2017 Document Reviewed: 04/29/2016 Elsevier Patient Education  2020 [employer].

## 2019-05-14 NOTE — Progress Notes (Signed)
Virtual Visit via Telephone Note  I connected with [name] on 05/14/19 at 5:54 PM by telephone and verified that I am speaking with the correct person using two identifiers.   I discussed the limitations, risks, security and privacy concerns of performing an evaluation and management service by telephone and the availability of in person appointments. I also discussed with the patient that there may be a patient responsible charge related to this service. The patient expressed understanding and agreed to proceed, consent obtained  Chief complaint:  Chief Complaint  Patient presents with  . Diabetes    per pt checking sugar in AM, 2h after eating; Kidney doctor wants labs done 05/17/19, yesterday sugar before eating( he eats oatmeal) 119 and 2h after eating 167, drinking Slim-Fast yesterday 113 pm    History of Present Illness: [name] is a 86 y.o. male  Diabetes: With CKD, followed by nephrology. Had decreased to 528m metformin prior due to renal disease, now asked to stop metformin completely off metfomin past 4-5 days.  Has bloodwork with nephrology in 3 days.   Has continued 8 units 70/30 BID.  Fasting yesterday 119, then 167 2 hrs after eating. 113 after slim fast (trying to lose weight, using slim fast for lunch).  Oatmeal in am, largest meal is dinner. No late night meals.  rare desert -once per week.  Denies missed doses of meds.   Lab Results  Component Value Date   HGBA1C 8.6 (H) 04/22/2019        Patient Active Problem List   Diagnosis Date Noted  . Osteoarthritis of right knee 03/04/2018  . Rib pain on left side 07/01/2016  . Fall 12/14/2015  . Elevated serum creatinine 12/14/2015  . Ileus ([facility] 12/14/2015  . Multiple fractures of ribs of left side 12/12/2015  . Prostate cancer ([facility] 05/26/2014  . Blood in the urine 12/01/2013  . Cystitis, radiation 12/01/2013  . Hematuria 12/01/2013  . 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 Malignant  neoplasm of prostate ([place] 11/26/2013  . Male erectile dysfunction 11/26/2013  . Pulsatile tinnitus 11/19/2013  . Rectal bleeding 12/23/2012  . Type II or unspecified type diabetes mellitus without mention of complication, not stated as uncontrolled   . Essential hypertension, benign   . Chronic back pain   . ED (erectile dysfunction)   . Anemia   . Hyperlipidemia   . CAD, NATIVE VESSEL 05/08/2008  . VENTRICULAR TACHYCARDIA 05/08/2008  . PREMATURE VENTRICULAR CONTRACTIONS 05/08/2008   Past Medical History:  Diagnosis Date  . Allergy   . CAD (coronary artery disease)    pci to LAD 10/09; stable CAD by cath 05/31/08; myoview 04/11/11- no ishcemia, EF 67%; echo 05/15/09- EF>55%, mod calcification of the aortic valve leaflets  . Chronic back pain   . ED (erectile dysfunction)   . Essential hypertension, benign   . Hyperlipidemia   . Multiple rib fractures    left 9th, 10th, and 11th posterior rib fractures S/P fall 12/09/2015/notes 12/12/2015  . Prostate cancer ([facility]    s/p  . Type II or unspecified type diabetes mellitus without mention of complication, not stated as uncontrolled   . Ventricular tachycardia ([facility]    resuscitated; monitor 05/2008; attempted T ablation 2/10- aborted due to inappropriate substrate   Past Surgical History:  Procedure Laterality Date  . CARDIAC CATHETERIZATION  05/31/08   EF 55-60%, stable lesions, medical therapy  . COLECTOMY  '80's   polyps  . CORONARY ANGIOPLASTY WITH STENT PLACEMENT  01/19/08   PCI stent to mid LAD with Promus DES 27.5x28  . IR GENERIC HISTORICAL  01/03/2016   IR THORACENTESIS ASP PLEURAL SPACE W/IMG GUIDE 01/03/2016 [facility]  . KNEE ARTHROPLASTY Right 03/04/2018   Procedure: COMPUTER ASSISTED TOTAL KNEE ARTHROPLASTY;  Surgeon: Rod Can, MD;  Location: [facility];  Service: Orthopedics;  Laterality: Right;  . PTCA  01/2008   Allergies  Allergen Reactions  . Contrast Media [Iodinated Diagnostic Agents] Swelling and Other (See  Comments)    Possible neck swelling after contrast for chest CT 03/2016. No hives, throat or other symptoms   Prior to Admission medications   Medication Sig Start Date End Date Taking? Authorizing Provider  amLODipine (NORVASC) 10 MG tablet Take 1 tablet (10 mg total) by mouth daily. 04/22/19  Yes [name], MD  atorvastatin (LIPITOR) 10 MG tablet Take 1 tablet (10 mg total) by mouth daily. 04/22/19  Yes [name], MD  ferrous sulfate 325 (65 FE) MG tablet Take 325 mg by mouth daily with breakfast.   Yes [provider]  hydrALAZINE (APRESOLINE) 25 MG tablet Take 0.5 tablets (12.5 mg total) by mouth 3 (three) times daily. 04/22/19  Yes [name], MD  insulin NPH-regular Human (NOVOLIN 70/30) (70-30) 100 UNIT/ML injection ADMINISTER 8 UNITS UNDER THE SKIN TWICE DAILY WITH A MEAL 04/22/19  Yes [name], MD  lisinopril-hydrochlorothiazide (ZESTORETIC) 20-25 MG tablet Take 1 tablet by mouth daily. 04/22/19  Yes [name], MD  metFORMIN (GLUCOPHAGE) 500 MG tablet Take 1 tablet (500 mg total) by mouth daily with breakfast. 04/22/19  Yes [name], MD  metoprolol tartrate (LOPRESSOR) 50 MG tablet Take 1 tablet (50 mg total) by mouth 2 (two) times daily. 04/22/19  Yes [name], MD  mirabegron ER (MYRBETRIQ) 25 MG TB24 tablet Take 25 mg by mouth daily.    Yes [provider]  Multiple Vitamin (MULTIVITAMIN) tablet Take 1 tablet by mouth daily. COMPLETE SENIOR VITAMINS AND M   Yes [provider]  Omega-3 Fatty Acids (FISH OIL) 1000 MG CAPS Take 2 capsules by mouth daily.    Yes [provider]  Vitamin D, Cholecalciferol, 1000 UNITS TABS Take 1 tablet by mouth daily.    Yes [provider]  Blood Glucose Monitoring Suppl (ACCU-CHEK AVIVA PLUS) w/Device KIT Test blood sugar 3 times daily. Dx code: E11.22 04/03/16   [name], MD  glucose blood (ACCU-CHEK AVIVA PLUS) test strip Test blood sugar 3 times  daily. Dx code: E11.22 04/03/16   [name], MD  Insulin Syringe-Needle U-100 (B-D INS SYR HALF-UNIT .3CC/31G) 31G X 5/16" 0.3 ML MISC Use daily at bedtime to inject insulin. Dx code: 250.00 06/21/13   [name], MD  Lancets (ACCU-CHEK MULTICLIX) lancets Test blood sugar 3 times daily. Dx code: E11.22 04/03/16   [name], MD  lisinopril (ZESTRIL) 20 MG tablet TAKE 1 TABLET (20 MG TOTAL) BY MOUTH DAILY. Patient not taking: Reported on 05/14/2019 03/18/19   [name], MD   Social History   Socioeconomic History  . Marital status: Legally Separated    Spouse name: Not on file  . Number of children: 7  . Years of education: Not on file  . Highest education level: Not on file  Occupational History  . Occupation: RETIRED    Employer: RETIRED  Tobacco Use  . Smoking status: Former Smoker    Packs/day: 0.00    Years: 15.00  Pack years: 0.00    Types: Cigarettes    Quit date: 05/10/2017    Years since quitting: 2.0  . Smokeless tobacco: Never Used  . Tobacco comment: Patient smokes occasionally. Not everyday.  Substance and Sexual Activity  . Alcohol use: No  . Drug use: No  . Sexual activity: Never  Other Topics Concern  . Not on file  Social History Narrative   29 grandchildren. Single. Education: 12 th grade. Exercise: 3-4 times a week for 30 minutes working out and set ups.   Social Determinants of Health   Financial Resource Strain:   . Difficulty of Paying Living Expenses: Not on file  Food Insecurity:   . Worried About Charity fundraiser in the Last Year: Not on file  . Ran Out of Food in the Last Year: Not on file  Transportation Needs:   . Lack of Transportation (Medical): Not on file  . Lack of Transportation (Non-Medical): Not on file  Physical Activity:   . Days of Exercise per Week: Not on file  . Minutes of Exercise per Session: Not on file  Stress:   . Feeling of Stress : Not on file  Social Connections:   . Frequency of  Communication with Friends and Family: Not on file  . Frequency of Social Gatherings with Friends and Family: Not on file  . Attends Religious Services: Not on file  . Active Member of Clubs or Organizations: Not on file  . Attends Archivist Meetings: Not on file  . Marital Status: Not on file  Intimate Partner Violence:   . Fear of Current or Ex-Partner: Not on file  . Emotionally Abused: Not on file  . Physically Abused: Not on file  . Sexually Abused: Not on file     Observations/Objective: Vitals:   05/14/19 1322  Weight: 233 lb (105.7 kg)  Height: 6' (1.829 m)   Speaking normally, no distress.  Understanding expressed of plan.  All questions answered.  Assessment and Plan: Stage 3 chronic kidney disease, unspecified whether stage 3a or 3b CKD  Type 2 diabetes mellitus with microalbuminuria, with long-term current use of insulin ([place]) Uncontrolled diabetes based on last A1c, but does not seem consistent with some of his home readings.  Continued on just 70/30 insulin for now.  8 units twice daily, metformin needed to be discontinued.  We will have him keep a record of his fastings, 2-hour after meals including after dinner to see if there is one area where he may be having spikes.  Recheck in 1 week with telemedicine visit to decide on insulin changes or additional medication.  Handout given on diabetes and nutrition, cautioned on meal replacements with use of insulin.  Understanding expressed.   Follow Up Instructions:   1 week, telemedicine.   Patient Instructions  Check blood sugar fasting, 2 hours after breakfast or lunch, and 2 hours after dinner.  Ok to pick one or two readings per day, but write them down for visit in week. No med changes other than stopping metformin for now.  See info on diabetes and nutrition below.    Diabetes Mellitus and Nutrition, Adult When you have diabetes (diabetes mellitus), it is very important to have healthy eating habits  because your blood sugar (glucose) levels are greatly affected by what you eat and drink. Eating healthy foods in the appropriate amounts, at about the same times every day, can help you:  Control your blood glucose.  Lower your risk  of heart disease.  Improve your blood pressure.  Reach or maintain a healthy weight. Every person with diabetes is different, and each person has different needs for a meal plan. Your health care provider may recommend that you work with a diet and nutrition specialist (dietitian) to make a meal plan that is best for you. Your meal plan may vary depending on factors such as:  The calories you need.  The medicines you take.  Your weight.  Your blood glucose, blood pressure, and cholesterol levels.  Your activity level.  Other health conditions you have, such as heart or kidney disease. How do carbohydrates affect me? Carbohydrates, also called carbs, affect your blood glucose level more than any other type of food. Eating carbs naturally raises the amount of glucose in your blood. Carb counting is a method for keeping track of how many carbs you eat. Counting carbs is important to keep your blood glucose at a healthy level, especially if you use insulin or take certain oral diabetes medicines. It is important to know how many carbs you can safely have in each meal. This is different for every person. Your dietitian can help you calculate how many carbs you should have at each meal and for each snack. Foods that contain carbs include:  Bread, cereal, rice, pasta, and crackers.  Potatoes and corn.  Peas, beans, and lentils.  Milk and yogurt.  Fruit and juice.  Desserts, such as cakes, cookies, ice cream, and candy. How does alcohol affect me? Alcohol can cause a sudden decrease in blood glucose (hypoglycemia), especially if you use insulin or take certain oral diabetes medicines. Hypoglycemia can be a life-threatening condition. Symptoms of  hypoglycemia (sleepiness, dizziness, and confusion) are similar to symptoms of having too much alcohol. If your health care provider says that alcohol is safe for you, follow these guidelines:  Limit alcohol intake to no more than 1 drink per day for nonpregnant women and 2 drinks per day for men. One drink equals 12 oz of beer, 5 oz of wine, or 1 oz of hard liquor.  Do not drink on an empty stomach.  Keep yourself hydrated with water, diet soda, or unsweetened iced tea.  Keep in mind that regular soda, juice, and other mixers may contain a lot of sugar and must be counted as carbs. What are tips for following this plan?  Reading food labels  Start by checking the serving size on the "Nutrition Facts" label of packaged foods and drinks. The amount of calories, carbs, fats, and other nutrients listed on the label is based on one serving of the item. Many items contain more than one serving per package.  Check the total grams (g) of carbs in one serving. You can calculate the number of servings of carbs in one serving by dividing the total carbs by 15. For example, if a food has 30 g of total carbs, it would be equal to 2 servings of carbs.  Check the number of grams (g) of saturated and trans fats in one serving. Choose foods that have low or no amount of these fats.  Check the number of milligrams (mg) of salt (sodium) in one serving. Most people should limit total sodium intake to less than 2,300 mg per day.  Always check the nutrition information of foods labeled as "low-fat" or "nonfat". These foods may be higher in added sugar or refined carbs and should be avoided.  Talk to your dietitian to identify your daily goals for  nutrients listed on the label. Shopping  Avoid buying canned, premade, or processed foods. These foods tend to be high in fat, sodium, and added sugar.  Shop around the outside edge of the grocery store. This includes fresh fruits and vegetables, bulk grains, fresh  meats, and fresh dairy. Cooking  Use low-heat cooking methods, such as baking, instead of high-heat cooking methods like deep frying.  Cook using healthy oils, such as olive, canola, or sunflower oil.  Avoid cooking with butter, cream, or high-fat meats. Meal planning  Eat meals and snacks regularly, preferably at the same times every day. Avoid going long periods of time without eating.  Eat foods high in fiber, such as fresh fruits, vegetables, beans, and whole grains. Talk to your dietitian about how many servings of carbs you can eat at each meal.  Eat 4-6 ounces (oz) of lean protein each day, such as lean meat, chicken, fish, eggs, or tofu. One oz of lean protein is equal to: ? 1 oz of meat, chicken, or fish. ? 1 egg. ?  cup of tofu.  Eat some foods each day that contain healthy fats, such as avocado, nuts, seeds, and fish. Lifestyle  Check your blood glucose regularly.  Exercise regularly as told by your health care provider. This may include: ? 150 minutes of moderate-intensity or vigorous-intensity exercise each week. This could be brisk walking, biking, or water aerobics. ? Stretching and doing strength exercises, such as yoga or weightlifting, at least 2 times a week.  Take medicines as told by your health care provider.  Do not use any products that contain nicotine or tobacco, such as cigarettes and e-cigarettes. If you need help quitting, ask your health care provider.  Work with a Social worker or diabetes educator to identify strategies to manage stress and any emotional and social challenges. Questions to ask a health care provider  Do I need to meet with a diabetes educator?  Do I need to meet with a dietitian?  What number can I call if I have questions?  When are the best times to check my blood glucose? Where to find more information:  American Diabetes Association: diabetes.org  Academy of Nutrition and Dietetics: www.eatright.[employer]  of Diabetes and Digestive and Kidney Diseases (NIH): [employer] Summary  A healthy meal plan will help you control your blood glucose and maintain a healthy lifestyle.  Working with a diet and nutrition specialist (dietitian) can help you make a meal plan that is best for you.  Keep in mind that carbohydrates (carbs) and alcohol have immediate effects on your blood glucose levels. It is important to count carbs and to use alcohol carefully. This information is not intended to replace advice given to you by your health care provider. Make sure you discuss any questions you have with your health care provider. Document Revised: 03/07/2017 Document Reviewed: 04/29/2016 Elsevier Patient Education  [place].    I discussed the assessment and treatment plan with the patient. The patient was provided an opportunity to ask questions and all were answered. The patient agreed with the plan and demonstrated an understanding of the instructions.   The patient was advised to call back or seek an in-person evaluation if the symptoms worsen or if the condition fails to improve as anticipated.  I provided 14 minutes of non-face-to-face time during this encounter.  Signed,   [name], MD [facility].  05/14/19

## 2019-05-17 DIAGNOSIS — N1832 Chronic kidney disease, stage 3b: Secondary | ICD-10-CM | POA: Diagnosis not present

## 2019-05-20 ENCOUNTER — Ambulatory Visit: Payer: Medicare HMO | Attending: Internal Medicine

## 2019-05-20 DIAGNOSIS — Z23 Encounter for immunization: Secondary | ICD-10-CM | POA: Insufficient documentation

## 2019-05-20 NOTE — Progress Notes (Signed)
   Covid-19 Vaccination Clinic  Name:  [name]    MRN: [id number] DOB: 07-21-1933  05/20/2019  [name] was observed post Covid-19 immunization for 15 minutes without incidence. He was provided with Vaccine Information Sheet and instruction to access the V-Safe system.   [name] was instructed to call 911 with any severe reactions post vaccine: Marland Kitchen Difficulty breathing  . Swelling of your face and throat  . A fast heartbeat  . A bad rash all over your body  . Dizziness and weakness    Immunizations Administered    Name Date Dose VIS Date Route   Pfizer COVID-19 Vaccine 05/20/2019 12:21 PM 0.3 mL 03/19/2019 Intramuscular   Manufacturer: [place]   Lot: [id number]   [place]: [id number]

## 2019-06-07 DIAGNOSIS — N1832 Chronic kidney disease, stage 3b: Secondary | ICD-10-CM | POA: Diagnosis not present

## 2019-06-14 ENCOUNTER — Telehealth: Payer: Self-pay | Admitting: Family Medicine

## 2019-06-14 DIAGNOSIS — N179 Acute kidney failure, unspecified: Secondary | ICD-10-CM | POA: Diagnosis not present

## 2019-06-14 DIAGNOSIS — N1832 Chronic kidney disease, stage 3b: Secondary | ICD-10-CM | POA: Diagnosis not present

## 2019-06-14 DIAGNOSIS — E872 Acidosis: Secondary | ICD-10-CM | POA: Diagnosis not present

## 2019-06-14 DIAGNOSIS — D631 Anemia in chronic kidney disease: Secondary | ICD-10-CM | POA: Diagnosis not present

## 2019-06-14 DIAGNOSIS — E1122 Type 2 diabetes mellitus with diabetic chronic kidney disease: Secondary | ICD-10-CM | POA: Diagnosis not present

## 2019-06-14 DIAGNOSIS — N2581 Secondary hyperparathyroidism of renal origin: Secondary | ICD-10-CM | POA: Diagnosis not present

## 2019-06-14 DIAGNOSIS — E1129 Type 2 diabetes mellitus with other diabetic kidney complication: Secondary | ICD-10-CM | POA: Diagnosis not present

## 2019-06-14 DIAGNOSIS — R809 Proteinuria, unspecified: Secondary | ICD-10-CM | POA: Diagnosis not present

## 2019-06-14 DIAGNOSIS — I129 Hypertensive chronic kidney disease with stage 1 through stage 4 chronic kidney disease, or unspecified chronic kidney disease: Secondary | ICD-10-CM | POA: Diagnosis not present

## 2019-06-14 NOTE — Telephone Encounter (Signed)
Pt went to kidney doctor this morning. Pt was told that he is to no longer take metformin. He was told to follow up with pcp to see if he is to get started on  A replacement med

## 2019-06-14 NOTE — Telephone Encounter (Signed)
Pt called just left kidney dr and that dr is wanting pt to go off medication Metformin. pts daughter says she sent in prof of power of attorney and she is the one that give pt his medication. She would like a call  Nichole Burkes [phone] Please advise.

## 2019-06-14 NOTE — Telephone Encounter (Signed)
Forwarded message to pcp and asst. Also informed daughter to call bk by 4pm today if she had not heard a response.

## 2019-06-15 NOTE — Telephone Encounter (Signed)
Pt states that he saw his kidney doctor and told him to stop the medication . He said that he forgot about it being discussed with dr,greene   He stated that he is doing fine and if anything changes he will call and schedule an appointment

## 2019-06-15 NOTE — Telephone Encounter (Signed)
Please see my most recent telemedicine visit on February 5.  We discontinued Metformin at that time.  Please let Nichole know that Metformin was discontinued at that time.  We did discuss plan for 1 week follow-up with home readings to determine if medication changes needed.  We will also route this note to scheduling to get this scheduled.  Thanks.

## 2019-06-16 ENCOUNTER — Other Ambulatory Visit: Payer: Self-pay

## 2019-06-16 ENCOUNTER — Ambulatory Visit (INDEPENDENT_AMBULATORY_CARE_PROVIDER_SITE_OTHER): Payer: Medicare HMO | Admitting: Family Medicine

## 2019-06-16 DIAGNOSIS — Z719 Counseling, unspecified: Secondary | ICD-10-CM

## 2019-06-16 NOTE — Patient Instructions (Signed)
° ° ° °  If you have lab work done today you will be contacted with your lab results within the next 2 weeks.  If you have not heard from us then please contact us. The fastest way to get your results is to register for My Chart. ° ° °IF you received an x-ray today, you will receive an invoice from [facility]. Please contact [facility] at [phone] with questions or concerns regarding your invoice.  ° °IF you received labwork today, you will receive an invoice from [facility]. Please contact [facility] at [phone] with questions or concerns regarding your invoice.  ° °Our billing staff will not be able to assist you with questions regarding bills from these companies. ° °You will be contacted with the lab results as soon as they are available. The fastest way to get your results is to activate your My Chart account. Instructions are located on the last page of this paperwork. If you have not heard from us regarding the results in 2 weeks, please contact this office. °  ° ° ° °

## 2019-07-12 ENCOUNTER — Encounter: Payer: Self-pay | Admitting: Family Medicine

## 2019-07-12 ENCOUNTER — Other Ambulatory Visit: Payer: Self-pay

## 2019-07-12 ENCOUNTER — Ambulatory Visit (INDEPENDENT_AMBULATORY_CARE_PROVIDER_SITE_OTHER): Payer: Medicare HMO | Admitting: Family Medicine

## 2019-07-12 VITALS — BP 174/68 | HR 99 | Temp 98.3°F | Ht 72.0 in | Wt 239.0 lb

## 2019-07-12 DIAGNOSIS — R220 Localized swelling, mass and lump, head: Secondary | ICD-10-CM

## 2019-07-12 DIAGNOSIS — I10 Essential (primary) hypertension: Secondary | ICD-10-CM

## 2019-07-12 LAB — CBC WITH DIFFERENTIAL/PLATELET
Basophils Absolute: 0 10*3/uL (ref 0.0–0.2)
Basos: 0 %
EOS (ABSOLUTE): 0.2 10*3/uL (ref 0.0–0.4)
Eos: 3 %
Hematocrit: 35.8 % — ABNORMAL LOW (ref 37.5–51.0)
Hemoglobin: 12.1 g/dL — ABNORMAL LOW (ref 13.0–17.7)
Immature Grans (Abs): 0 10*3/uL (ref 0.0–0.1)
Immature Granulocytes: 0 %
Lymphocytes Absolute: 1.1 10*3/uL (ref 0.7–3.1)
Lymphs: 18 %
MCH: 26.8 pg (ref 26.6–33.0)
MCHC: 33.8 g/dL (ref 31.5–35.7)
MCV: 79 fL (ref 79–97)
Monocytes Absolute: 0.6 10*3/uL (ref 0.1–0.9)
Monocytes: 9 %
Neutrophils Absolute: 4.3 10*3/uL (ref 1.4–7.0)
Neutrophils: 70 %
Platelets: 226 10*3/uL (ref 150–450)
RBC: 4.52 x10E6/uL (ref 4.14–5.80)
RDW: 15.2 % (ref 11.6–15.4)
WBC: 6.2 10*3/uL (ref 3.4–10.8)

## 2019-07-12 NOTE — Patient Instructions (Addendum)
   Try these sour candies  -  Apply jolly rancher hard candy -  Cry baby -  Sweet tarts  If the swelling of the jaw gets worse then proceed with the ultrasound  If the swelling stays the same or gets worse then follow up with the ultrasound and see Dr. Carlota Raspberry in 2 weeks    If you have lab work done today you will be contacted with your lab results within the next 2 weeks.  If you have not heard from [place] then please contact us. The fastest way to get your results is to register for My Chart.   IF you received an x-ray today, you will receive an invoice from [facility]. Please contact [facility] at [phone] with questions or concerns regarding your invoice.   IF you received labwork today, you will receive an invoice from [facility]. Please contact [facility] at [phone] with questions or concerns regarding your invoice.   Our billing staff will not be able to assist you with questions regarding bills from these companies.  You will be contacted with the lab results as soon as they are available. The fastest way to get your results is to activate your My Chart account. Instructions are located on the last page of this paperwork. If you have not heard from [place] regarding the results in 2 weeks, please contact this office.

## 2019-07-12 NOTE — Progress Notes (Signed)
Established Patient Office Visit  Subjective:  Patient ID: [name], male    DOB: October 24, 1933  Age: 86 y.o. MRN: [id number]  CC:  Chief Complaint  Patient presents with  . R side facial swelling    x 3 days / pain with preassure on it/ started furosemide per kidney spec. 1 week ago    HPI Garry L M[place] presents for   Facial mass Pt reports that she has been having swelling in the right neck that started on 07/09/19 He states that the neck swelling on the right just seemed to pop up out of the blue He states that all weekend the area seemed to be slightly smaller It only hurts when he mashes on the area He reports that he has no drainage into his mouth, no ear pain He states that he has never had anything like this before He is a nonsmoker He denies night-sweats, fevers or chills He denies feeling ill   Uncontrolled hypertension  He states that his kidney doctor gave him new blood pressure medications He has a follow up appointment on 07/19/19 He reports that the blood pressure has improved but since the mass popped up on his face he has been stressed. He drank 3 cups of coffee this morning.  He denies headache, chest pain or dizziness. BP Readings from Last 3 Encounters:  07/12/19 (!) 174/68  04/22/19 (!) 154/77  03/01/19 (!) 174/84     Past Medical History:  Diagnosis Date  . Allergy   . CAD (coronary artery disease)    pci to LAD 10/09; stable CAD by cath 05/31/08; myoview 04/11/11- no ishcemia, EF 67%; echo 05/15/09- EF>55%, mod calcification of the aortic valve leaflets  . Chronic back pain   . ED (erectile dysfunction)   . Essential hypertension, benign   . Hyperlipidemia   . Multiple rib fractures    left 9th, 10th, and 11th posterior rib fractures S/P fall 12/09/2015/notes 12/12/2015  . Prostate cancer ([place])    s/p  . Type II or unspecified type diabetes mellitus without mention of complication, not stated as uncontrolled   . Ventricular tachycardia ([place])    resuscitated; monitor 05/2008; attempted T ablation 2/10- aborted due to inappropriate substrate    Past Surgical History:  Procedure Laterality Date  . CARDIAC CATHETERIZATION  05/31/08   EF 55-60%, stable lesions, medical therapy  . COLECTOMY  '80's   polyps  . CORONARY ANGIOPLASTY WITH STENT PLACEMENT  01/19/08   PCI stent to mid LAD with Promus DES 27.5x28  . IR GENERIC HISTORICAL  01/03/2016   IR THORACENTESIS ASP PLEURAL SPACE W/IMG GUIDE 01/03/2016 [facility]  . KNEE ARTHROPLASTY Right 03/04/2018   Procedure: COMPUTER ASSISTED TOTAL KNEE ARTHROPLASTY;  Surgeon: Rod Can, MD;  Location: [facility];  Service: Orthopedics;  Laterality: Right;  . PTCA  01/2008    Family History  Problem Relation Age of Onset  . Diabetes Mother   . Heart attack Mother   . Leukemia Father   . Heart disease Sister   . Heart disease Brother   . Diabetes Brother        pancreatic cancer    Social History   Socioeconomic History  . Marital status: Legally Separated    Spouse name: Not on file  . Number of children: 7  . Years of education: Not on file  . Highest education level: Not on file  Occupational History  . Occupation: RETIRED    Employer: RETIRED  Tobacco Use  .  Smoking status: Former Smoker    Packs/day: 0.00    Years: 15.00    Pack years: 0.00    Types: Cigarettes    Quit date: 05/10/2017    Years since quitting: 2.1  . Smokeless tobacco: Never Used  . Tobacco comment: Patient smokes occasionally. Not everyday.  Substance and Sexual Activity  . Alcohol use: No  . Drug use: No  . Sexual activity: Never  Other Topics Concern  . Not on file  Social History Narrative   29 grandchildren. Single. Education: 12 th grade. Exercise: 3-4 times a week for 30 minutes working out and set ups.   Social Determinants of Health   Financial Resource Strain:   . Difficulty of Paying Living Expenses:   Food Insecurity:   . Worried About Charity fundraiser in the Last Year:   .  Arboriculturist in the Last Year:   Transportation Needs:   . Film/video editor (Medical):   Marland Kitchen Lack of Transportation (Non-Medical):   Physical Activity:   . Days of Exercise per Week:   . Minutes of Exercise per Session:   Stress:   . Feeling of Stress :   Social Connections:   . Frequency of Communication with Friends and Family:   . Frequency of Social Gatherings with Friends and Family:   . Attends Religious Services:   . Active Member of Clubs or Organizations:   . Attends Archivist Meetings:   Marland Kitchen Marital Status:   Intimate Partner Violence:   . Fear of Current or Ex-Partner:   . Emotionally Abused:   Marland Kitchen Physically Abused:   . Sexually Abused:     Outpatient Medications Prior to Visit  Medication Sig Dispense Refill  . amLODipine (NORVASC) 10 MG tablet Take 1 tablet (10 mg total) by mouth daily. 90 tablet 1  . atorvastatin (LIPITOR) 10 MG tablet Take 1 tablet (10 mg total) by mouth daily. 90 tablet 1  . Blood Glucose Monitoring Suppl (ACCU-CHEK AVIVA PLUS) w/Device KIT Test blood sugar 3 times daily. Dx code: E11.22 1 kit 0  . docusate sodium (COLACE) 100 MG capsule Colace 100 mg capsule   100 mg by oral route.    . ferrous sulfate 325 (65 FE) MG tablet Take 325 mg by mouth daily with breakfast.    . furosemide (LASIX) 40 MG tablet     . glucose blood (ACCU-CHEK AVIVA PLUS) test strip Test blood sugar 3 times daily. Dx code: E11.22 300 each 3  . hydrALAZINE (APRESOLINE) 50 MG tablet     . insulin NPH-regular Human (NOVOLIN 70/30) (70-30) 100 UNIT/ML injection ADMINISTER 8 UNITS UNDER THE SKIN TWICE DAILY WITH A MEAL 15 mL 5  . Insulin Syringe-Needle U-100 (B-D INS SYR HALF-UNIT .3CC/31G) 31G X 5/16" 0.3 ML MISC Use daily at bedtime to inject insulin. Dx code: 250.00 100 each 3  . Lancets (ACCU-CHEK MULTICLIX) lancets Test blood sugar 3 times daily. Dx code: E11.22 300 each 3  . lisinopril-hydrochlorothiazide (ZESTORETIC) 20-25 MG tablet Take 1 tablet by mouth  daily. 90 tablet 1  . metoprolol tartrate (LOPRESSOR) 50 MG tablet Take 1 tablet (50 mg total) by mouth 2 (two) times daily. 180 tablet 1  . mirabegron ER (MYRBETRIQ) 25 MG TB24 tablet Take 25 mg by mouth daily.     . Multiple Vitamin (MULTIVITAMIN) tablet Take 1 tablet by mouth daily. COMPLETE SENIOR VITAMINS AND M    . Omega-3 Fatty Acids (FISH OIL) 1000 MG CAPS  Take 2 capsules by mouth daily.     . Vitamin D, Cholecalciferol, 1000 UNITS TABS Take 1 tablet by mouth daily.     . hydrALAZINE (APRESOLINE) 25 MG tablet Take 0.5 tablets (12.5 mg total) by mouth 3 (three) times daily. 45 tablet 1  . lisinopril (ZESTRIL) 20 MG tablet TAKE 1 TABLET (20 MG TOTAL) BY MOUTH DAILY. (Patient not taking: Reported on 07/12/2019) 90 tablet 1   No facility-administered medications prior to visit.    Allergies  Allergen Reactions  . Contrast Media [Iodinated Diagnostic Agents] Swelling and Other (See Comments)    Possible neck swelling after contrast for chest CT 03/2016. No hives, throat or other symptoms    ROS Review of Systems Review of Systems  Constitutional: Negative for activity change, appetite change, chills and fever.  HENT: Negative for congestion, nosebleeds, trouble swallowing and voice change.   Respiratory: Negative for cough, shortness of breath and wheezing.   Gastrointestinal: Negative for diarrhea, nausea and vomiting.  Genitourinary: Negative for difficulty urinating, dysuria, flank pain and hematuria.  Musculoskeletal: Negative for back pain, joint swelling and neck pain.  Neurological: Negative for dizziness, speech difficulty, light-headedness and numbness.  See HPI. All other review of systems negative.     Objective:    Physical Exam  BP (!) 174/68   Pulse 99   Temp 98.3 F (36.8 C)   Ht 6' (1.829 m)   Wt 239 lb (108.4 kg)   SpO2 98%   BMI 32.41 kg/m  Wt Readings from Last 3 Encounters:  07/12/19 239 lb (108.4 kg)  05/14/19 233 lb (105.7 kg)  04/22/19 236 lb 9.6  oz (107.3 kg)   Physical Exam  Constitutional: Oriented to person, place, and time. Appears well-developed and well-nourished.  HENT:  Head: Normocephalic and atraumatic.  Eyes: Conjunctivae and EOM are normal.  Notable soft submandibular mass Neck: soft, no palpable LAD Cardiovascular: Normal rate, regular rhythm, normal heart sounds and intact distal pulses.  No murmur heard. Pulmonary/Chest: Effort normal and breath sounds normal. No stridor. No respiratory distress. Has no wheezes.  Neurological: Is alert and oriented to person, place, and time.  Skin: Skin is warm. Capillary refill takes less than 2 seconds.  Psychiatric: Has a normal mood and affect. Behavior is normal. Judgment and thought content normal.    There are no preventive care reminders to display for this patient.  There are no preventive care reminders to display for this patient.  Lab Results  Component Value Date   TSH 2.922 12/27/2013   Lab Results  Component Value Date   WBC 6.7 10/19/2018   HGB 11.2 (L) 10/19/2018   HCT 35.0 (L) 10/19/2018   MCV 77 (L) 10/19/2018   PLT 247 10/19/2018   Lab Results  Component Value Date   NA 138 04/22/2019   K 4.7 04/22/2019   CO2 18 (L) 04/22/2019   GLUCOSE 143 (H) 04/22/2019   BUN 24 04/22/2019   CREATININE 1.77 (H) 04/22/2019   BILITOT 0.2 02/09/2019   ALKPHOS 46 02/09/2019   AST 12 02/09/2019   ALT 13 02/09/2019   PROT 6.7 02/09/2019   ALBUMIN 4.1 02/09/2019   CALCIUM 9.1 04/22/2019   ANIONGAP 6 03/05/2018   Lab Results  Component Value Date   CHOL 118 02/09/2019   Lab Results  Component Value Date   HDL 38 (L) 02/09/2019   Lab Results  Component Value Date   LDLCALC 55 02/09/2019   Lab Results  Component Value Date  TRIG 142 02/09/2019   Lab Results  Component Value Date   CHOLHDL 3.1 02/09/2019   Lab Results  Component Value Date   HGBA1C 8.6 (H) 04/22/2019      Assessment & Plan:   Problem List Items Addressed This Visit     None    Visit Diagnoses    Facial mass    -  Primary Discussed that since it is not fluctuance abscess is unlikely Lymphoma is a possibility but with the sudden appearance overnight less likely and there are no systemic signs   Relevant Orders   CBC with Differential/Platelet   US SOFT TISSUE HEAD & NECK (NON-THYROID)   Mandibular mass    -  Infection less likely but sent a cbc Will check [place] of neck Patient has a contrast allergy Also gave instructions to try sour candy as blocked salivary gland is still on the differential   Relevant Orders   US SOFT TISSUE HEAD & NECK (NON-THYROID)   Uncontrolled hypertension    -  Discussed that he should continue his home meds Follow up with NEPHROLOGY Cut down on coffee intake    Relevant Medications   furosemide (LASIX) 40 MG tablet   hydrALAZINE (APRESOLINE) 50 MG tablet      No orders of the defined types were placed in this encounter.  A total of 25 minutes were spent face-to-face with the patient during this encounter and over half of that time was spent on counseling and coordination of care.  Follow-up: No follow-ups on file.    Forrest Moron, MD

## 2019-07-13 DIAGNOSIS — N1832 Chronic kidney disease, stage 3b: Secondary | ICD-10-CM | POA: Diagnosis not present

## 2019-07-19 DIAGNOSIS — E872 Acidosis: Secondary | ICD-10-CM | POA: Diagnosis not present

## 2019-07-19 DIAGNOSIS — N189 Chronic kidney disease, unspecified: Secondary | ICD-10-CM | POA: Diagnosis not present

## 2019-07-19 DIAGNOSIS — N2581 Secondary hyperparathyroidism of renal origin: Secondary | ICD-10-CM | POA: Diagnosis not present

## 2019-07-19 DIAGNOSIS — N179 Acute kidney failure, unspecified: Secondary | ICD-10-CM | POA: Diagnosis not present

## 2019-07-19 DIAGNOSIS — N1832 Chronic kidney disease, stage 3b: Secondary | ICD-10-CM | POA: Diagnosis not present

## 2019-07-19 DIAGNOSIS — E1122 Type 2 diabetes mellitus with diabetic chronic kidney disease: Secondary | ICD-10-CM | POA: Diagnosis not present

## 2019-07-19 DIAGNOSIS — E1129 Type 2 diabetes mellitus with other diabetic kidney complication: Secondary | ICD-10-CM | POA: Diagnosis not present

## 2019-07-19 DIAGNOSIS — D631 Anemia in chronic kidney disease: Secondary | ICD-10-CM | POA: Diagnosis not present

## 2019-07-19 DIAGNOSIS — I129 Hypertensive chronic kidney disease with stage 1 through stage 4 chronic kidney disease, or unspecified chronic kidney disease: Secondary | ICD-10-CM | POA: Diagnosis not present

## 2019-08-04 ENCOUNTER — Other Ambulatory Visit: Payer: Self-pay

## 2019-08-04 ENCOUNTER — Encounter: Payer: Self-pay | Admitting: Family Medicine

## 2019-08-04 ENCOUNTER — Telehealth (INDEPENDENT_AMBULATORY_CARE_PROVIDER_SITE_OTHER): Payer: Medicare HMO | Admitting: Family Medicine

## 2019-08-04 VITALS — BP 174/77 | HR 81 | Ht 72.0 in | Wt 239.0 lb

## 2019-08-04 DIAGNOSIS — R220 Localized swelling, mass and lump, head: Secondary | ICD-10-CM

## 2019-08-04 DIAGNOSIS — R0600 Dyspnea, unspecified: Secondary | ICD-10-CM | POA: Diagnosis not present

## 2019-08-04 DIAGNOSIS — R609 Edema, unspecified: Secondary | ICD-10-CM

## 2019-08-04 DIAGNOSIS — R0609 Other forms of dyspnea: Secondary | ICD-10-CM

## 2019-08-04 NOTE — Progress Notes (Signed)
Virtual Visit via video note  I connected with [name] on 08/04/19 at 5:17 PM by a video enabled telemedicine application [employer] and verified that I am speaking with the correct person using two identifiers.    I discussed the limitations, risks, security and privacy concerns of performing an evaluation and management service by telephone and the availability of in person appointments. I also discussed with the patient that there may be a patient responsible charge related to this service. The patient expressed understanding and agreed to proceed, consent obtained.  Spouse [name] present as well with his permission.   Chief complaint:   Chief Complaint  Patient presents with  . Edema    pt states both legs swelled up about a week ago. pt states he was told to suck on sour candy to help  . sore Jaw    pt reports that his Jaw is sore on the R side. pt states this started a week ago.     History of Present Illness: [name] is a 86 y.o. male  Leg swelling: Started few weeks ago. Saw nephrologist few weeks ago. Discussed swelling with nephrology. Started on furosemide '40mg'$  QD as needed for swelling.  Takes intermittently - 3 times per week. Minimal improvement. Had some R leg swelling with prior knee surgery, now in both legs past few weeks.  No chest pains.  Occasional shortness of breath with walking to trash, or with certain activities - past few weeks only. About the same during that time.  No known change with furosemide.  No fever/cough/change in taste or smell.  No recent echo- EF 55% in 2409, nl systolic fxn. Hx of CAD.  No paroxysmal nocturnal dyspnea, possible mild orthopnea.  Hard to use compression stockings.  Minimal less swelling overnight.   Jaw pain:  Seen 07/12/19 by Dr. Nolon Rod.  R soft submandibular mass. No fluctuance. Sour candy recommended for possible blocked salivary gland. Sore to press, but no fevers. Planned ultrasound.  Swelling has  improved, but still some swelling and slight tenderness. No fever. No pain to chew. No trismus, swallowing ok.     Patient Active Problem List   Diagnosis Date Noted  . Osteoarthritis of right knee 03/04/2018  . Rib pain on left side 07/01/2016  . Fall 12/14/2015  . Elevated serum creatinine 12/14/2015  . Ileus ([place]) 12/14/2015  . Multiple fractures of ribs of left side 12/12/2015  . Prostate cancer ([place]) 05/26/2014  . Blood in the urine 12/01/2013  . Cystitis, radiation 12/01/2013  . Hematuria 12/01/2013  . 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 Malignant neoplasm of prostate ([place]) 11/26/2013  . Male erectile dysfunction 11/26/2013  . Pulsatile tinnitus 11/19/2013  . Rectal bleeding 12/23/2012  . Type II or unspecified type diabetes mellitus without mention of complication, not stated as uncontrolled   . Essential hypertension, benign   . Chronic back pain   . ED (erectile dysfunction)   . Anemia   . Hyperlipidemia   . CAD, NATIVE VESSEL 05/08/2008  . VENTRICULAR TACHYCARDIA 05/08/2008  . PREMATURE VENTRICULAR CONTRACTIONS 05/08/2008   Past Medical History:  Diagnosis Date  . Allergy   . CAD (coronary artery disease)    pci to LAD 10/09; stable CAD by cath 05/31/08; myoview 04/11/11- no ishcemia, EF 67%; echo 05/15/09- EF>55%, mod calcification of the aortic valve leaflets  . Chronic back pain   . ED (erectile dysfunction)   . Essential hypertension, benign   . Hyperlipidemia   . Multiple rib  fractures    left 9th, 10th, and 11th posterior rib fractures S/P fall 12/09/2015/notes 12/12/2015  . Prostate cancer ([place])    s/p  . Type II or unspecified type diabetes mellitus without mention of complication, not stated as uncontrolled   . Ventricular tachycardia ([place])    resuscitated; monitor 05/2008; attempted T ablation 2/10- aborted due to inappropriate substrate   Past Surgical History:  Procedure Laterality Date  . CARDIAC CATHETERIZATION  05/31/08   EF 55-60%, stable lesions,  medical therapy  . COLECTOMY  '80's   polyps  . CORONARY ANGIOPLASTY WITH STENT PLACEMENT  01/19/08   PCI stent to mid LAD with Promus DES 27.5x28  . IR GENERIC HISTORICAL  01/03/2016   IR THORACENTESIS ASP PLEURAL SPACE W/IMG GUIDE 01/03/2016 [facility]  . KNEE ARTHROPLASTY Right 03/04/2018   Procedure: COMPUTER ASSISTED TOTAL KNEE ARTHROPLASTY;  Surgeon: Rod Can, MD;  Location: [facility];  Service: Orthopedics;  Laterality: Right;  . PTCA  01/2008   Allergies  Allergen Reactions  . Contrast Media [Iodinated Diagnostic Agents] Swelling and Other (See Comments)    Possible neck swelling after contrast for chest CT 03/2016. No hives, throat or other symptoms   Prior to Admission medications   Medication Sig Start Date End Date Taking? Authorizing Provider  amLODipine (NORVASC) 10 MG tablet Take 1 tablet (10 mg total) by mouth daily. 04/22/19   [name], MD  atorvastatin (LIPITOR) 10 MG tablet Take 1 tablet (10 mg total) by mouth daily. 04/22/19   [name], MD  Blood Glucose Monitoring Suppl (ACCU-CHEK AVIVA PLUS) w/Device KIT Test blood sugar 3 times daily. Dx code: E11.22 04/03/16   [name], MD  docusate sodium (COLACE) 100 MG capsule Colace 100 mg capsule   100 mg by oral route. 03/04/18   [provider]  ferrous sulfate 325 (65 FE) MG tablet Take 325 mg by mouth daily with breakfast.    [provider]  furosemide (LASIX) 40 MG tablet  07/01/19   [provider]  glucose blood (ACCU-CHEK AVIVA PLUS) test strip Test blood sugar 3 times daily. Dx code: E11.22 04/03/16   [name], MD  hydrALAZINE (APRESOLINE) 50 MG tablet  06/26/19   [provider]  insulin NPH-regular Human (NOVOLIN 70/30) (70-30) 100 UNIT/ML injection ADMINISTER 8 UNITS UNDER THE SKIN TWICE DAILY WITH A MEAL 04/22/19   [name], MD  Insulin Syringe-Needle U-100 (B-D INS SYR HALF-UNIT .3CC/31G) 31G X 5/16" 0.3 ML MISC Use daily at bedtime  to inject insulin. Dx code: 250.00 06/21/13   [name], MD  Lancets (ACCU-CHEK MULTICLIX) lancets Test blood sugar 3 times daily. Dx code: E11.22 04/03/16   [name], MD  lisinopril (ZESTRIL) 20 MG tablet TAKE 1 TABLET (20 MG TOTAL) BY MOUTH DAILY. Patient not taking: Reported on 07/12/2019 03/18/19   [name], MD  lisinopril-hydrochlorothiazide (ZESTORETIC) 20-25 MG tablet Take 1 tablet by mouth daily. 04/22/19   [name], MD  metoprolol tartrate (LOPRESSOR) 50 MG tablet Take 1 tablet (50 mg total) by mouth 2 (two) times daily. 04/22/19   [name], MD  mirabegron ER (MYRBETRIQ) 25 MG TB24 tablet Take 25 mg by mouth daily.     [provider]  Multiple Vitamin (MULTIVITAMIN) tablet Take 1 tablet by mouth daily. COMPLETE SENIOR VITAMINS AND M    [provider]  Omega-3 Fatty Acids (FISH OIL) 1000 MG CAPS Take 2 capsules by mouth daily.  [provider]  Vitamin D, Cholecalciferol, 1000 UNITS TABS Take 1 tablet by mouth daily.     [provider]   Social History   Socioeconomic History  . Marital status: Legally Separated    Spouse name: Not on file  . Number of children: 7  . Years of education: Not on file  . Highest education level: Not on file  Occupational History  . Occupation: RETIRED    Employer: RETIRED  Tobacco Use  . Smoking status: Former Smoker    Packs/day: 0.00    Years: 15.00    Pack years: 0.00    Types: Cigarettes    Quit date: 05/10/2017    Years since quitting: 2.2  . Smokeless tobacco: Never Used  . Tobacco comment: Patient smokes occasionally. Not everyday.  Substance and Sexual Activity  . Alcohol use: No  . Drug use: No  . Sexual activity: Never  Other Topics Concern  . Not on file  Social History Narrative   29 grandchildren. Single. Education: 12 th grade. Exercise: 3-4 times a week for 30 minutes working out and set ups.   Social Determinants of Health   Financial  Resource Strain:   . Difficulty of Paying Living Expenses:   Food Insecurity:   . Worried About Charity fundraiser in the Last Year:   . Arboriculturist in the Last Year:   Transportation Needs:   . Film/video editor (Medical):   Marland Kitchen Lack of Transportation (Non-Medical):   Physical Activity:   . Days of Exercise per Week:   . Minutes of Exercise per Session:   Stress:   . Feeling of Stress :   Social Connections:   . Frequency of Communication with Friends and Family:   . Frequency of Social Gatherings with Friends and Family:   . Attends Religious Services:   . Active Member of Clubs or Organizations:   . Attends Archivist Meetings:   Marland Kitchen Marital Status:   Intimate Partner Violence:   . Fear of Current or Ex-Partner:   . Emotionally Abused:   Marland Kitchen Physically Abused:   . Sexually Abused:     Observations/Objective: Vitals:   08/04/19 1507  BP: (!) 174/77  Pulse: 81  Weight: 239 lb (108.4 kg)  Height: 6' (1.829 m)   Face : locates swelling/soreness side of face, not neck, anterior to parotid. No appreciable swelling on video but suboptimal lighting and resolution of video.  Speaking in full sentences, no respiratory distress.   Assessment and Plan: Swelling of right side of face  - improved, still some persistnet discomfort. Possible sialolithiasis/sialodenitis. eval in office.   Peripheral edema DOE (dyspnea on exertion)  - recent furosemide rx from nephrology. Persistent edema, DOE  - differential includes CHF with hx of CAD. Prior EF normal, but no recent echo. Has furosemide, compression stockings.   - eval in office in 2 days. ER precautions if any acute worsening.   Follow Up Instructions:  2 days in office.    I discussed the assessment and treatment plan with the patient. The patient was provided an opportunity to ask questions and all were answered. The patient agreed with the plan and demonstrated an understanding of the instructions.   The  patient was advised to call back or seek an in-person evaluation if the symptoms worsen or if the condition fails to improve as anticipated.  I provided 29 minutes of non-face-to-face time during this encounter.   [name],  MD

## 2019-08-04 NOTE — Patient Instructions (Signed)
° ° ° °  If you have lab work done today you will be contacted with your lab results within the next 2 weeks.  If you have not heard from us then please contact us. The fastest way to get your results is to register for My Chart. ° ° °IF you received an x-ray today, you will receive an invoice from [facility]. Please contact [facility] at [phone] with questions or concerns regarding your invoice.  ° °IF you received labwork today, you will receive an invoice from [facility]. Please contact [facility] at [phone] with questions or concerns regarding your invoice.  ° °Our billing staff will not be able to assist you with questions regarding bills from these companies. ° °You will be contacted with the lab results as soon as they are available. The fastest way to get your results is to activate your My Chart account. Instructions are located on the last page of this paperwork. If you have not heard from us regarding the results in 2 weeks, please contact this office. °  ° ° ° °

## 2019-08-06 ENCOUNTER — Ambulatory Visit (INDEPENDENT_AMBULATORY_CARE_PROVIDER_SITE_OTHER): Payer: Medicare HMO

## 2019-08-06 ENCOUNTER — Other Ambulatory Visit: Payer: Self-pay

## 2019-08-06 ENCOUNTER — Encounter: Payer: Self-pay | Admitting: Family Medicine

## 2019-08-06 ENCOUNTER — Ambulatory Visit (INDEPENDENT_AMBULATORY_CARE_PROVIDER_SITE_OTHER): Payer: Medicare HMO | Admitting: Family Medicine

## 2019-08-06 VITALS — BP 140/70 | HR 94 | Temp 98.0°F | Ht 72.0 in | Wt 238.0 lb

## 2019-08-06 DIAGNOSIS — R0609 Other forms of dyspnea: Secondary | ICD-10-CM

## 2019-08-06 DIAGNOSIS — N189 Chronic kidney disease, unspecified: Secondary | ICD-10-CM | POA: Diagnosis not present

## 2019-08-06 DIAGNOSIS — R0600 Dyspnea, unspecified: Secondary | ICD-10-CM

## 2019-08-06 DIAGNOSIS — R609 Edema, unspecified: Secondary | ICD-10-CM | POA: Diagnosis not present

## 2019-08-06 DIAGNOSIS — R519 Headache, unspecified: Secondary | ICD-10-CM

## 2019-08-06 DIAGNOSIS — J9811 Atelectasis: Secondary | ICD-10-CM | POA: Diagnosis not present

## 2019-08-06 NOTE — Patient Instructions (Addendum)
Swelling/soreness on the side of the face.  Has been a swollen duct from the salivary gland.  As it is improving I think it is okay to continue to watch that for another week or so but if not continue to improve or any worsening symptoms I would recommend either evaluation by ear nose and throat or at minimum an ultrasound of that area.  Follow-up if that is worsening.  Continue furosemide daily, I will try to have you seen by cardiology, Dr. [name] in the next week.  If any chest pains or worsening of symptoms be seen in the emergency room.  Peripheral Edema  Peripheral edema is swelling that is caused by a buildup of fluid. Peripheral edema most often affects the lower legs, ankles, and feet. It can also develop in the arms, hands, and face. The area of the body that has peripheral edema will look swollen. It may also feel heavy or warm. Your clothes may start to feel tight. Pressing on the area may make a temporary dent in your skin. You may not be able to move your swollen arm or leg as much as usual. There are many causes of peripheral edema. It can happen because of a complication of other conditions such as congestive heart failure, kidney disease, or a problem with your blood circulation. It also can be a side effect of certain medicines or because of an infection. It often happens to women during pregnancy. Sometimes, the cause is not known. Follow these instructions at home: Managing pain, stiffness, and swelling   Raise (elevate) your legs while you are sitting or lying down.  Move around often to prevent stiffness and to lessen swelling.  Do not sit or stand for long periods of time.  Wear support stockings as told by your health care provider. Medicines  Take over-the-counter and prescription medicines only as told by your health care provider.  Your health care provider may prescribe medicine to help your body get rid of excess water (diuretic). General instructions  Pay  attention to any changes in your symptoms.  Follow instructions from your health care provider about limiting salt (sodium) in your diet. Sometimes, eating less salt may reduce swelling.  Moisturize skin daily to help prevent skin from cracking and draining.  Keep all follow-up visits as told by your health care provider. This is important. Contact a health care provider if you have:  A fever.  Edema that starts suddenly or is getting worse, especially if you are pregnant or have a medical condition.  Swelling in only one leg.  Increased swelling, redness, or pain in one or both of your legs.  Drainage or sores at the area where you have edema. Get help right away if you:  Develop shortness of breath, especially when you are lying down.  Have pain in your chest or abdomen.  Feel weak.  Feel faint. Summary  Peripheral edema is swelling that is caused by a buildup of fluid. Peripheral edema most often affects the lower legs, ankles, and feet.  Move around often to prevent stiffness and to lessen swelling. Do not sit or stand for long periods of time.  Pay attention to any changes in your symptoms.  Contact a health care provider if you have edema that starts suddenly or is getting worse, especially if you are pregnant or have a medical condition.  Get help right away if you develop shortness of breath, especially when lying down. This information is not intended to replace  advice given to you by your health care provider. Make sure you discuss any questions you have with your health care provider. Document Revised: 12/17/2017 Document Reviewed: 12/17/2017 Elsevier Patient Education  [employer].     If you have lab work done today you will be contacted with your lab results within the next 2 weeks.  If you have not heard from [place] then please contact us. The fastest way to get your results is to register for My Chart.   IF you received an x-ray today, you will  receive an invoice from [facility]. Please contact [facility] at [phone] with questions or concerns regarding your invoice.   IF you received labwork today, you will receive an invoice from [facility]. Please contact [facility] at [phone] with questions or concerns regarding your invoice.   Our billing staff will not be able to assist you with questions regarding bills from these companies.  You will be contacted with the lab results as soon as they are available. The fastest way to get your results is to activate your My Chart account. Instructions are located on the last page of this paperwork. If you have not heard from [place] regarding the results in 2 weeks, please contact this office.

## 2019-08-06 NOTE — Progress Notes (Signed)
Subjective:  Patient ID: [name], male    DOB: [birth year]  Age: 86 y.o. MRN: [id number]  CC:  Chief Complaint  Patient presents with  . Follow-up    on edema bilateraly in legs and sore Jaw on R side. pt states his L leg is more swollen than the Right. pt also states he is having pain in his ankels and even more pain when he flexes the ankels. swelling in jaw has gone down some, but if he touches the R side of his jaw its painful to the touch.    HPI [name] presents for   Peripheral edema Discussed on phone visit few days ago. Swelling for past few weeks.   Has been followed by nephrology, Dr. Royce Macadamia, recently started on furosemide 4/12. Was told kidney fxn was doing better. Has labs in 5 days. Taking lasix 3-4 days per week as needed.   History of R knee surgery.  Did admit to some dyspnea on exertion on phone visit as well as minimal orthopnea.  No chest pains.  History of CAD, managed by Dr. Alvester Chou.  No recent echo, but previous echocardiogram with normal EF years ago. DOE -past few weeks. Not at rest. Some puffiness in hands - goes down. Less now.  Took furosemide 4m QD past 2 days. Unable to get compression stockings on.  Home bp 135/77.   Face swelling: See prior notes.  Evaluated by Dr. SNolon Rod25 days ago.  Possible sialolithiasis.  Sour candies recommended. feels like this improved.  Ultrasound had been discussed but has not had completed.  Overall improvement of swelling, still some slight soreness at the right cheek area.  No fevers. Eating and drinking ok, no dysphagia.    History Patient Active Problem List   Diagnosis Date Noted  . Osteoarthritis of right knee 03/04/2018  . Rib pain on left side 07/01/2016  . Fall 12/14/2015  . Elevated serum creatinine 12/14/2015  . Ileus ([place] 12/14/2015  . Multiple fractures of ribs of left side 12/12/2015  . Prostate cancer ([place] 05/26/2014  . Blood in the urine 12/01/2013  . Cystitis, radiation 12/01/2013  .  Hematuria 12/01/2013  . 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 Malignant neoplasm of prostate ([place] 11/26/2013  . Male erectile dysfunction 11/26/2013  . Pulsatile tinnitus 11/19/2013  . Rectal bleeding 12/23/2012  . Type II or unspecified type diabetes mellitus without mention of complication, not stated as uncontrolled   . Essential hypertension, benign   . Chronic back pain   . ED (erectile dysfunction)   . Anemia   . Hyperlipidemia   . CAD, NATIVE VESSEL 05/08/2008  . VENTRICULAR TACHYCARDIA 05/08/2008  . PREMATURE VENTRICULAR CONTRACTIONS 05/08/2008   Past Medical History:  Diagnosis Date  . Allergy   . CAD (coronary artery disease)    pci to LAD 10/09; stable CAD by cath 05/31/08; myoview 04/11/11- no ishcemia, EF 67%; echo 05/15/09- EF>55%, mod calcification of the aortic valve leaflets  . Chronic back pain   . ED (erectile dysfunction)   . Essential hypertension, benign   . Hyperlipidemia   . Multiple rib fractures    left 9th, 10th, and 11th posterior rib fractures S/P fall 12/09/2015/notes 12/12/2015  . Prostate cancer ([place]    s/p  . Type II or unspecified type diabetes mellitus without mention of complication, not stated as uncontrolled   . Ventricular tachycardia ([facility]    resuscitated; monitor 05/2008; attempted T ablation 2/10- aborted due to inappropriate substrate  Past Surgical History:  Procedure Laterality Date  . CARDIAC CATHETERIZATION  05/31/08   EF 55-60%, stable lesions, medical therapy  . COLECTOMY  '80's   polyps  . CORONARY ANGIOPLASTY WITH STENT PLACEMENT  01/19/08   PCI stent to mid LAD with Promus DES 27.5x28  . IR GENERIC HISTORICAL  01/03/2016   IR THORACENTESIS ASP PLEURAL SPACE W/IMG GUIDE 01/03/2016 [facility]INTER[facility] RAD  . KNEE ARTHROPLASTY Right 03/04/2018   Procedure: COMPUTER ASSISTED TOTAL KNEE ARTHROPLASTY;  Surgeon: Rod Can, MD;  Location: [facility];  Service: Orthopedics;  Laterality: Right;  . PTCA  01/2008   Allergies  Allergen Reactions  .  Contrast Media [Iodinated Diagnostic Agents] Swelling and Other (See Comments)    Possible neck swelling after contrast for chest CT 03/2016. No hives, throat or other symptoms   Prior to Admission medications   Medication Sig Start Date End Date Taking? Authorizing Provider  amLODipine (NORVASC) 10 MG tablet Take 1 tablet (10 mg total) by mouth daily. 04/22/19  Yes [name], MD  atorvastatin (LIPITOR) 10 MG tablet Take 1 tablet (10 mg total) by mouth daily. 04/22/19  Yes [name], MD  Blood Glucose Monitoring Suppl (ACCU-CHEK AVIVA PLUS) w/Device KIT Test blood sugar 3 times daily. Dx code: E11.22 04/03/16  Yes [name], MD  docusate sodium (COLACE) 100 MG capsule Colace 100 mg capsule   100 mg by oral route. 03/04/18  Yes [provider]  ferrous sulfate 325 (65 FE) MG tablet Take 325 mg by mouth daily with breakfast.   Yes [provider]  furosemide (LASIX) 40 MG tablet Take 40 mg by mouth daily. As needed for swelling 07/01/19  Yes [provider]  glucose blood (ACCU-CHEK AVIVA PLUS) test strip Test blood sugar 3 times daily. Dx code: E11.22 04/03/16  Yes [name], MD  hydrALAZINE (APRESOLINE) 50 MG tablet  06/26/19  Yes [provider]  insulin NPH-regular Human (NOVOLIN 70/30) (70-30) 100 UNIT/ML injection ADMINISTER 8 UNITS UNDER THE SKIN TWICE DAILY WITH A MEAL 04/22/19  Yes [name], MD  Insulin Syringe-Needle U-100 (B-D INS SYR HALF-UNIT .3CC/31G) 31G X 5/16" 0.3 ML MISC Use daily at bedtime to inject insulin. Dx code: 250.00 06/21/13  Yes [name], MD  Lancets (ACCU-CHEK MULTICLIX) lancets Test blood sugar 3 times daily. Dx code: E11.22 04/03/16  Yes [name], MD  lisinopril (ZESTRIL) 20 MG tablet TAKE 1 TABLET (20 MG TOTAL) BY MOUTH DAILY. 03/18/19  Yes [name], MD  lisinopril-hydrochlorothiazide (ZESTORETIC) 20-25 MG tablet Take 1 tablet by mouth daily. 04/22/19  Yes [name], MD  metoprolol tartrate (LOPRESSOR) 50 MG tablet Take 1 tablet (50 mg total) by mouth 2 (two) times daily. 04/22/19  Yes [name], MD  mirabegron ER (MYRBETRIQ) 25 MG TB24 tablet Take 25 mg by mouth daily.    Yes [provider]  Multiple Vitamin (MULTIVITAMIN) tablet Take 1 tablet by mouth daily. COMPLETE SENIOR VITAMINS AND M   Yes [provider]  Omega-3 Fatty Acids (FISH OIL) 1000 MG CAPS Take 2 capsules by mouth daily.    Yes [provider]  Vitamin D, Cholecalciferol, 1000 UNITS TABS Take 1 tablet by mouth daily.    Yes [provider]  lisinopril (ZESTRIL) 10 MG tablet  07/19/19   [provider]  metFORMIN (GLUCOPHAGE) 500 MG tablet  07/22/19   [provider]   Social History   Socioeconomic History  .  Marital status: Legally Separated    Spouse name: Not on file  . Number of children: 7  . Years of education: Not on file  . Highest education level: Not on file  Occupational History  . Occupation: RETIRED    Employer: RETIRED  Tobacco Use  . Smoking status: Former Smoker    Packs/day: 0.00    Years: 15.00    Pack years: 0.00    Types: Cigarettes    Quit date: 05/10/2017    Years since quitting: 2.2  . Smokeless tobacco: Never Used  . Tobacco comment: Patient smokes occasionally. Not everyday.  Substance and Sexual Activity  . Alcohol use: No  . Drug use: No  . Sexual activity: Never  Other Topics Concern  . Not on file  Social History Narrative   29 grandchildren. Single. Education: 12 th grade. Exercise: 3-4 times a week for 30 minutes working out and set ups.   Social Determinants of Health   Financial Resource Strain:   . Difficulty of Paying Living Expenses:   Food Insecurity:   . Worried About Charity fundraiser in the Last Year:   . Arboriculturist in the Last Year:   Transportation Needs:   . Film/video editor (Medical):   Marland Kitchen Lack of Transportation (Non-Medical):   Physical Activity:     . Days of Exercise per Week:   . Minutes of Exercise per Session:   Stress:   . Feeling of Stress :   Social Connections:   . Frequency of Communication with Friends and Family:   . Frequency of Social Gatherings with Friends and Family:   . Attends Religious Services:   . Active Member of Clubs or Organizations:   . Attends Archivist Meetings:   Marland Kitchen Marital Status:   Intimate Partner Violence:   . Fear of Current or Ex-Partner:   . Emotionally Abused:   Marland Kitchen Physically Abused:   . Sexually Abused:     Review of Systems  HENT: Positive for facial swelling (R face. ).   Respiratory: Positive for shortness of breath. Negative for choking and wheezing.   Cardiovascular: Positive for leg swelling. Negative for chest pain.     Objective:   Vitals:   08/06/19 0802  BP: (!) 160/79  Pulse: 94  Temp: 98 F (36.7 C)  TempSrc: Temporal  SpO2: 97%  Weight: 238 lb (108 kg)  Height: 6' (1.829 m)     Physical Exam Vitals reviewed.  Constitutional:      Appearance: He is well-developed.  HENT:     Head: Normocephalic and atraumatic.     Jaw: There is normal jaw occlusion. No tenderness or pain on movement.      Comments: Dentures intact, no pain on gumline. Eyes:     Pupils: Pupils are equal, round, and reactive to light.  Neck:     Vascular: No carotid bruit or JVD.  Cardiovascular:     Rate and Rhythm: Normal rate and regular rhythm.     Heart sounds: Normal heart sounds. No murmur. No gallop.   Pulmonary:     Effort: Pulmonary effort is normal.     Breath sounds: Normal breath sounds. No rales.  Musculoskeletal:     Right lower leg: Edema (2+ pedal edema to proximal third tibia bilaterally, calves nontender.  No stasis changes or wounds.) present.     Left lower leg: Edema present.  Skin:    General: Skin is warm and dry.  Neurological:     Mental Status: He is alert and oriented to person, place, and time.  Psychiatric:        Mood and Affect: Mood  normal.        Behavior: Behavior normal.    EKG: Sinus rhythm, rate 73.  Nonspecific T wave in lead III, flat T waves II, aVF, V6.   DG Chest 2 View  Result Date: 08/06/2019 CLINICAL DATA:  Dyspnea on exertion, pedal edema for few weeks, question CHF EXAM: CHEST - 2 VIEW COMPARISON:  10/19/2018 FINDINGS: Normal heart size, mediastinal contours, and pulmonary vascularity. Atherosclerotic calcification aorta. Chronic peribronchial thickening and RIGHT basilar atelectasis. Lungs otherwise clear. No acute infiltrate, pleural effusion or pneumothorax. Mild scattered endplate spur formation thoracic spine. IMPRESSION: Chronic bronchitic changes and RIGHT basilar atelectasis. No acute abnormalities. Electronically Signed   By: Lavonia Dana M.D.   On: 08/06/2019 09:10     Assessment & Plan:  KEYWON MESTRE is a 86 y.o. male . Peripheral edema - Plan: Ambulatory referral to Cardiology Chronic kidney disease, unspecified CKD stage - Plan: Basic metabolic panel DOE (dyspnea on exertion) - Plan: EKG 12-Lead, DG Chest 2 View, Pro b natriuretic peptide, Ambulatory referral to Cardiology  -Edema may be related to renal disease, has recently been started on furosemide.  With history of CAD and reported dyspnea on exertion will check brain natruretic peptide, but chest x-ray without signs of failure.  Close follow-up with cardiology recommended, will refer back to Dr. [name].  ER precautions given.  Continue furosemide daily, elevation of legs as able, then compression stockings when swelling has improved to the point he can place those.  Right-sided face pain  -Possible sialoadenitis/sialolithiasis.  Clear fluid expressed from Stensen's duct in office, doubt obstruction or infection at this time and has improved.  Continue symptomatic care, monitor for the next week to 10 days and if not continue to improve would recommend ENT eval or ultrasound of affected area.  Sooner if worse.   No orders of the defined  types were placed in this encounter.  Patient Instructions       If you have lab work done today you will be contacted with your lab results within the next 2 weeks.  If you have not heard from [place] then please contact us. The fastest way to get your results is to register for My Chart.   IF you received an x-ray today, you will receive an invoice from [facility] Rad[facility]. Please contact [facility] Radiology at [phone] with questions or concerns regarding your invoice.   IF you received labwork today, you will receive an invoice from [facility] [place]. Please contact [facility] at [phone] with questions or concerns regarding your invoice.   Our billing staff will not be able to assist you with questions regarding bills from these companies.  You will be contacted with the lab results as soon as they are available. The fastest way to get your results is to activate your My Chart account. Instructions are located on the last page of this paperwork. If you have not heard from [place] regarding the results in 2 weeks, please contact this office.         Signed, [name], MD [facility]

## 2019-08-07 LAB — BASIC METABOLIC PANEL
BUN/Creatinine Ratio: 10 (ref 10–24)
BUN: 20 mg/dL (ref 8–27)
CO2: 20 mmol/L (ref 20–29)
Calcium: 8.8 mg/dL (ref 8.6–10.2)
Chloride: 110 mmol/L — ABNORMAL HIGH (ref 96–106)
Creatinine, Ser: 2.1 mg/dL — ABNORMAL HIGH (ref 0.76–1.27)
GFR calc Af Amer: 33 mL/min/{1.73_m2} — ABNORMAL LOW (ref >59)
GFR calc non Af Amer: 28 mL/min/{1.73_m2} — ABNORMAL LOW (ref >59)
Glucose: 168 mg/dL — ABNORMAL HIGH (ref 65–99)
Potassium: 4.4 mmol/L (ref 3.5–5.2)
Sodium: 145 mmol/L — ABNORMAL HIGH (ref 134–144)

## 2019-08-07 LAB — PRO B NATRIURETIC PEPTIDE: NT-Pro BNP: 431 pg/mL (ref 0–486)

## 2019-08-10 DIAGNOSIS — N1832 Chronic kidney disease, stage 3b: Secondary | ICD-10-CM | POA: Diagnosis not present

## 2019-08-13 ENCOUNTER — Ambulatory Visit: Payer: Medicare HMO | Admitting: Cardiovascular Disease

## 2019-08-15 ENCOUNTER — Other Ambulatory Visit: Payer: Self-pay | Admitting: Family Medicine

## 2019-08-15 DIAGNOSIS — I10 Essential (primary) hypertension: Secondary | ICD-10-CM

## 2019-08-15 NOTE — Telephone Encounter (Signed)
Requested medications are due for refill today?  There are two lisinoprils and one lisinopril combination med on patient's active medication list.  Very confusing.  This request is for Lisinopril 20 mg.    Requested medications are on active medication list?  Not sure which strength patient is supposed to be taking at this time.    Last Refill:   Lisinopril 10 mg started on 07/19/2019 is listed as a historical medication.  Lisinopril 20 mg was refilled on 03/18/2019 # 90 with one refill and lisinopril - HCTZ 20-25 was refilled on 04/22/2019 # 90 with one refill.    Future visit scheduled?  Yes  Notes to Clinic:  Last creatinine was 2.10 on 08/06/2019.  An increase from previous creatinine of 1.77.  Please review this refill request and update active medication list.   Thank you.

## 2019-08-18 ENCOUNTER — Ambulatory Visit: Payer: Medicare HMO | Admitting: Cardiovascular Disease

## 2019-08-18 ENCOUNTER — Encounter: Payer: Self-pay | Admitting: Cardiovascular Disease

## 2019-08-18 ENCOUNTER — Other Ambulatory Visit: Payer: Self-pay

## 2019-08-18 VITALS — BP 138/58 | HR 102 | Ht 72.0 in | Wt 239.4 lb

## 2019-08-18 DIAGNOSIS — I4729 Other ventricular tachycardia: Secondary | ICD-10-CM

## 2019-08-18 DIAGNOSIS — I472 Ventricular tachycardia: Secondary | ICD-10-CM

## 2019-08-18 DIAGNOSIS — I10 Essential (primary) hypertension: Secondary | ICD-10-CM | POA: Diagnosis not present

## 2019-08-18 DIAGNOSIS — R600 Localized edema: Secondary | ICD-10-CM | POA: Diagnosis not present

## 2019-08-18 DIAGNOSIS — I2510 Atherosclerotic heart disease of native coronary artery without angina pectoris: Secondary | ICD-10-CM | POA: Diagnosis not present

## 2019-08-18 DIAGNOSIS — R0600 Dyspnea, unspecified: Secondary | ICD-10-CM

## 2019-08-18 DIAGNOSIS — R0609 Other forms of dyspnea: Secondary | ICD-10-CM

## 2019-08-18 MED ORDER — FUROSEMIDE 40 MG PO TABS: 40.0000 mg | ORAL_TABLET | Freq: Two times a day (BID) | ORAL | 3 refills | Status: DC

## 2019-08-18 NOTE — Progress Notes (Signed)
08/18/2019 [name]   [birth year]  [id number]  Primary Physician [name], MD Primary Cardiologist: Lorretta Harp MD Lupe Carney, [place]  HPI:  [name] is a 86 y.o.  moderately overweight, recently remarried for the third time (09/30/14)African American male, father of 3 children and 58 stepchildren, grandfather to 68 grandchildren, who is formerly a patient of Dr. Francine Graven. I last saw him  02/09/2019. He has a history of CAD status post LAD stenting with a Promus drug-eluting stent, October of 2009. He had attempt at VT ablation by Dr. Thompson Grayer, February 2010; however, this was aborted because of inappropriate substrate. His other problems include hypertension, hyperlipidemia, diabetes, as well as history of prostate cancer. Since I saw him, he has been asymptomatic. His last Myoview performed 04/11/11 was completely normal. He did stop his aspirin because of rectal bleeding is noticed increased evening flushing with Niaspan. His most recent lipid profile performed 02/13/2018 revealed total cholesterol 137, LDL 62 and HDL 39.  Since I saw him 6 months ago he has done well until several weeks ago when he started to notice bilateral lower extremity edema and some shortness of breath.  He denies chest pain.  He avoid salt.  He is saw his PCP who placed him on furosemide 40 mg a day which resulted in mild improvement.   Current Meds  Medication Sig  . amLODipine (NORVASC) 10 MG tablet Take 1 tablet (10 mg total) by mouth daily.  Marland Kitchen atorvastatin (LIPITOR) 10 MG tablet Take 1 tablet (10 mg total) by mouth daily.  . Blood Glucose Monitoring Suppl (ACCU-CHEK AVIVA PLUS) w/Device KIT Test blood sugar 3 times daily. Dx code: E11.22  . docusate sodium (COLACE) 100 MG capsule Colace 100 mg capsule   100 mg by oral route.  . ferrous sulfate 325 (65 FE) MG tablet Take 325 mg by mouth daily with breakfast.  . furosemide (LASIX) 40 MG tablet Take 1 tablet (40 mg total) by  mouth 2 (two) times daily. As needed for swelling  . glucose blood (ACCU-CHEK AVIVA PLUS) test strip Test blood sugar 3 times daily. Dx code: E11.22  . insulin NPH-regular Human (NOVOLIN 70/30) (70-30) 100 UNIT/ML injection ADMINISTER 8 UNITS UNDER THE SKIN TWICE DAILY WITH A MEAL  . Insulin Syringe-Needle U-100 (B-D INS SYR HALF-UNIT .3CC/31G) 31G X 5/16" 0.3 ML MISC Use daily at bedtime to inject insulin. Dx code: 250.00  . Lancets (ACCU-CHEK MULTICLIX) lancets Test blood sugar 3 times daily. Dx code: E11.22  . lisinopril (ZESTRIL) 20 MG tablet TAKE 1 TABLET (20 MG TOTAL) BY MOUTH DAILY.  . metFORMIN (GLUCOPHAGE) 500 MG tablet   . metoprolol tartrate (LOPRESSOR) 50 MG tablet Take 1 tablet (50 mg total) by mouth 2 (two) times daily.  . mirabegron ER (MYRBETRIQ) 25 MG TB24 tablet Take 25 mg by mouth daily.   . Multiple Vitamin (MULTIVITAMIN) tablet Take 1 tablet by mouth daily. COMPLETE SENIOR VITAMINS AND M  . Omega-3 Fatty Acids (FISH OIL) 1000 MG CAPS Take 2 capsules by mouth daily.   . Vitamin D, Cholecalciferol, 1000 UNITS TABS Take 1 tablet by mouth daily.   . [DISCONTINUED] furosemide (LASIX) 40 MG tablet Take 40 mg by mouth daily. As needed for swelling     Allergies  Allergen Reactions  . Contrast Media [Iodinated Diagnostic Agents] Swelling and Other (See Comments)    Possible neck swelling after contrast for chest CT 03/2016. No hives, throat or other symptoms  Social History   Socioeconomic History  . Marital status: Legally Separated    Spouse name: Not on file  . Number of children: 7  . Years of education: Not on file  . Highest education level: Not on file  Occupational History  . Occupation: RETIRED    Employer: RETIRED  Tobacco Use  . Smoking status: Former Smoker    Packs/day: 0.00    Years: 15.00    Pack years: 0.00    Types: Cigarettes    Quit date: 05/10/2017    Years since quitting: 2.2  . Smokeless tobacco: Never Used  . Tobacco comment: Patient smokes  occasionally. Not everyday.  Substance and Sexual Activity  . Alcohol use: No  . Drug use: No  . Sexual activity: Never  Other Topics Concern  . Not on file  Social History Narrative   29 grandchildren. Single. Education: 12 th grade. Exercise: 3-4 times a week for 30 minutes working out and set ups.   Social Determinants of Health   Financial Resource Strain:   . Difficulty of Paying Living Expenses:   Food Insecurity:   . Worried About Charity fundraiser in the Last Year:   . Arboriculturist in the Last Year:   Transportation Needs:   . Film/video editor (Medical):   Marland Kitchen Lack of Transportation (Non-Medical):   Physical Activity:   . Days of Exercise per Week:   . Minutes of Exercise per Session:   Stress:   . Feeling of Stress :   Social Connections:   . Frequency of Communication with Friends and Family:   . Frequency of Social Gatherings with Friends and Family:   . Attends Religious Services:   . Active Member of Clubs or Organizations:   . Attends Archivist Meetings:   Marland Kitchen Marital Status:   Intimate Partner Violence:   . Fear of Current or Ex-Partner:   . Emotionally Abused:   Marland Kitchen Physically Abused:   . Sexually Abused:      Review of Systems: General: negative for chills, fever, night sweats or weight changes.  Cardiovascular: negative for chest pain, dyspnea on exertion, edema, orthopnea, palpitations, paroxysmal nocturnal dyspnea or shortness of breath Dermatological: negative for rash Respiratory: negative for cough or wheezing Urologic: negative for hematuria Abdominal: negative for nausea, vomiting, diarrhea, bright red blood per rectum, melena, or hematemesis Neurologic: negative for visual changes, syncope, or dizziness All other systems reviewed and are otherwise negative except as noted above.    Blood pressure (!) 138/58, pulse (!) 102, height 6' (1.829 m), weight 239 lb 6.4 oz (108.6 kg), SpO2 97 %.  General appearance: alert and no  distress Neck: no adenopathy, no carotid bruit, no JVD, supple, symmetrical, trachea midline and thyroid not enlarged, symmetric, no tenderness/mass/nodules Lungs: clear to auscultation bilaterally Heart: regular rate and rhythm, S1, S2 normal, no murmur, click, rub or gallop Extremities: 2+ pitting edema bilaterally Pulses: 2+ and symmetric Skin: Skin color, texture, turgor normal. No rashes or lesions Neurologic: Alert and oriented X 3, normal strength and tone. Normal symmetric reflexes. Normal coordination and gait  EKG not performed today  ASSESSMENT AND PLAN:   CAD, NATIVE VESSEL History of CAD status post LAD Promus drug-eluting stenting October 2009.  Has been stable since that time.  VENTRICULAR TACHYCARDIA History of ventricular tachycardia past with failed attempt at VT ablation by Dr. Rayann Heman February 2010.  Essential hypertension, benign History of essential hypertension blood pressure measured today at 138/58.  He is on amlodipine, lisinopril and metoprolol.  Hyperlipidemia History of hyperlipidemia on statin therapy with lipid profile performed 02/09/2019 revealing total cholesterol 118, LDL 55 and HDL 38.  Lower extremity edema Bilateral lower extreme edema new over the last 3 to 4 weeks.  He does not admit to increased salt intake.  He does have some increasing shortness of breath.  He has moderate chronic renal insufficiency with a serum creatinine in the 2 range.  He was recently placed on furosemide 40 mg once a day with mild improvement.  Going to double this to twice daily, check a basic metabolic panel in 7 to 10 days and have him see an APP back in 1 month.  We will also check a 2D echocardiogram.      Lorretta Harp MD [facility], [facility] 08/18/2019 3:26 PM

## 2019-08-18 NOTE — Assessment & Plan Note (Signed)
History of CAD status post LAD Promus drug-eluting stenting October 2009.  Has been stable since that time.

## 2019-08-18 NOTE — Assessment & Plan Note (Signed)
History of essential hypertension blood pressure measured today at 138/58.  He is on amlodipine, lisinopril and metoprolol.

## 2019-08-18 NOTE — Assessment & Plan Note (Signed)
Bilateral lower extreme edema new over the last 3 to 4 weeks.  He does not admit to increased salt intake.  He does have some increasing shortness of breath.  He has moderate chronic renal insufficiency with a serum creatinine in the 2 range.  He was recently placed on furosemide 40 mg once a day with mild improvement.  Going to double this to twice daily, check a basic metabolic panel in 7 to 10 days and have him see an APP back in 1 month.  We will also check a 2D echocardiogram.

## 2019-08-18 NOTE — Assessment & Plan Note (Signed)
History of ventricular tachycardia past with failed attempt at VT ablation by Dr. Rayann Heman February 2010.

## 2019-08-18 NOTE — Assessment & Plan Note (Signed)
History of hyperlipidemia on statin therapy with lipid profile performed 02/09/2019 revealing total cholesterol 118, LDL 55 and HDL 38.

## 2019-08-18 NOTE — Patient Instructions (Signed)
Medication Instructions:  INCREASE FUROSEMIDE TO 40 MG TWICE DAILY  *If you need a refill on your cardiac medications before your next appointment, please call your pharmacy*   Lab Work: If you have labs (blood work) drawn today and your tests are completely normal, you will receive your results only by: Marland Kitchen MyChart Message (if you have MyChart) OR . A paper copy in the mail If you have any lab test that is abnormal or we need to change your treatment, we will call you to review the results.   Testing/Procedures: Your physician has requested that you have an echocardiogram. Echocardiography is a painless test that uses sound waves to create images of your heart. It provides your doctor with information about the size and shape of your heart and how well your heart's chambers and valves are working. This procedure takes approximately one hour. There are no restrictions for this procedure.[place]    Follow-Up: At [facility], you and your health needs are our priority.  As part of our continuing mission to provide you with exceptional heart care, we have created designated Provider Care Teams.  These Care Teams include your primary Cardiologist (physician) and Advanced Practice Providers (APPs -  Physician Assistants and Nurse Practitioners) who all work together to provide you with the care you need, when you need it.  We recommend signing up for the patient portal called "MyChart".  Sign up information is provided on this After Visit Summary.  MyChart is used to connect with patients for Virtual Visits (Telemedicine).  Patients are able to view lab/test results, encounter notes, upcoming appointments, etc.  Non-urgent messages can be sent to your provider as well.   To learn more about what you can do with MyChart, go to [email or web address].    Your next appointment:    Your physician recommends that you schedule a follow-up appointment in: [place]  physician recommends that you schedule a follow-up appointment in: [place]

## 2019-09-02 DIAGNOSIS — C61 Malignant neoplasm of prostate: Secondary | ICD-10-CM | POA: Diagnosis not present

## 2019-09-07 ENCOUNTER — Other Ambulatory Visit: Payer: Self-pay

## 2019-09-07 ENCOUNTER — Ambulatory Visit (INDEPENDENT_AMBULATORY_CARE_PROVIDER_SITE_OTHER): Payer: Medicare HMO | Admitting: Cardiology

## 2019-09-07 ENCOUNTER — Encounter: Payer: Self-pay | Admitting: Cardiology

## 2019-09-07 VITALS — BP 152/78 | HR 86 | Ht 72.0 in | Wt 232.3 lb

## 2019-09-07 DIAGNOSIS — R809 Proteinuria, unspecified: Secondary | ICD-10-CM | POA: Diagnosis not present

## 2019-09-07 DIAGNOSIS — I509 Heart failure, unspecified: Secondary | ICD-10-CM

## 2019-09-07 DIAGNOSIS — I130 Hypertensive heart and chronic kidney disease with heart failure and stage 1 through stage 4 chronic kidney disease, or unspecified chronic kidney disease: Secondary | ICD-10-CM | POA: Diagnosis not present

## 2019-09-07 DIAGNOSIS — I10 Essential (primary) hypertension: Secondary | ICD-10-CM

## 2019-09-07 DIAGNOSIS — E119 Type 2 diabetes mellitus without complications: Secondary | ICD-10-CM

## 2019-09-07 DIAGNOSIS — Z794 Long term (current) use of insulin: Secondary | ICD-10-CM | POA: Diagnosis not present

## 2019-09-07 DIAGNOSIS — I2510 Atherosclerotic heart disease of native coronary artery without angina pectoris: Secondary | ICD-10-CM

## 2019-09-07 DIAGNOSIS — Z8679 Personal history of other diseases of the circulatory system: Secondary | ICD-10-CM

## 2019-09-07 DIAGNOSIS — Z9861 Coronary angioplasty status: Secondary | ICD-10-CM | POA: Diagnosis not present

## 2019-09-07 DIAGNOSIS — E1129 Type 2 diabetes mellitus with other diabetic kidney complication: Secondary | ICD-10-CM | POA: Diagnosis not present

## 2019-09-07 DIAGNOSIS — N183 Chronic kidney disease, stage 3 unspecified: Secondary | ICD-10-CM

## 2019-09-07 DIAGNOSIS — E782 Mixed hyperlipidemia: Secondary | ICD-10-CM

## 2019-09-07 DIAGNOSIS — E1159 Type 2 diabetes mellitus with other circulatory complications: Secondary | ICD-10-CM | POA: Diagnosis not present

## 2019-09-07 DIAGNOSIS — N1832 Chronic kidney disease, stage 3b: Secondary | ICD-10-CM | POA: Diagnosis not present

## 2019-09-07 DIAGNOSIS — N179 Acute kidney failure, unspecified: Secondary | ICD-10-CM | POA: Insufficient documentation

## 2019-09-07 DIAGNOSIS — E1122 Type 2 diabetes mellitus with diabetic chronic kidney disease: Secondary | ICD-10-CM | POA: Diagnosis not present

## 2019-09-07 HISTORY — DX: Heart failure, unspecified: I50.9

## 2019-09-07 LAB — BASIC METABOLIC PANEL
BUN/Creatinine Ratio: 14 (ref 10–24)
BUN: 29 mg/dL — ABNORMAL HIGH (ref 8–27)
CO2: 22 mmol/L (ref 20–29)
Calcium: 9 mg/dL (ref 8.6–10.2)
Chloride: 104 mmol/L (ref 96–106)
Creatinine, Ser: 2.09 mg/dL — ABNORMAL HIGH (ref 0.76–1.27)
GFR calc Af Amer: 33 mL/min/{1.73_m2} — ABNORMAL LOW (ref >59)
GFR calc non Af Amer: 28 mL/min/{1.73_m2} — ABNORMAL LOW (ref >59)
Glucose: 264 mg/dL — ABNORMAL HIGH (ref 65–99)
Potassium: 4.5 mmol/L (ref 3.5–5.2)
Sodium: 141 mmol/L (ref 134–144)

## 2019-09-07 NOTE — Progress Notes (Signed)
Cardiology Office Note:    Date:  09/07/2019   ID:  ORI [name], DOB May 04, 1933, MRN [id number]  PCP:  [name], MD  Cardiologist:  Quay Burow, MD  Electrophysiologist:  Dr Rayann Heman  Referring MD: [name], MD   No chief complaint on file. F/U from OV two weeks ago  History of Present Illness:    [name] is a 86 y.o. male with a hx of coronary disease, status post remote LAD PCI with DES in 2009.  Myoview in 2013 was low risk.  He also has a history of ventricular tachycardia and underwent an attempted ablation in 2010 but this was unsuccessful.  Fortunately is not had recurrent problems with this.  Other medical issues include insulin-dependent diabetes, hypertension, dyslipidemia, prostate cancer followed by Dr [name] in [facility], and chronic renal insufficiency stage IIIb followed at [facility].   Patient was seen by Dr. [name] 08/18/2019.  He complained of lower extremity edema and increasing shortness of breath with exertion.  The patient had recently been placed on Lasix 40 mg daily.  Dr. [name] increased this to 40 mg twice daily.  Echocardiogram was ordered.  He returns today for follow-up.  His echo is not scheduled till tomorrow.  He had his labs this morning and they are pending.  Symptomatically he feels better with less edema and less shortness of breath.  His weight is down about 7 pounds.  His BP in the office was a little high but he told me he has not taken his medications yet today.  Past Medical History:  Diagnosis Date  . Allergy   . CAD (coronary artery disease)    pci to LAD 10/09; stable CAD by cath 05/31/08; myoview 04/11/11- no ishcemia, EF 67%; echo 05/15/09- EF>55%, mod calcification of the aortic valve leaflets  . Chronic back pain   . ED (erectile dysfunction)   . Essential hypertension, benign   . Hyperlipidemia   . Multiple rib fractures    left 9th, 10th, and 11th posterior rib fractures S/P fall 12/09/2015/notes 12/12/2015  .  Prostate cancer ([place])    s/p  . Type II or unspecified type diabetes mellitus without mention of complication, not stated as uncontrolled   . Ventricular tachycardia ([place])    resuscitated; monitor 05/2008; attempted T ablation 2/10- aborted due to inappropriate substrate    Past Surgical History:  Procedure Laterality Date  . CARDIAC CATHETERIZATION  05/31/08   EF 55-60%, stable lesions, medical therapy  . COLECTOMY  '80's   polyps  . CORONARY ANGIOPLASTY WITH STENT PLACEMENT  01/19/08   PCI stent to mid LAD with Promus DES 27.5x28  . IR GENERIC HISTORICAL  01/03/2016   IR THORACENTESIS ASP PLEURAL SPACE W/IMG GUIDE 01/03/2016 [facility]-INTERV RAD  . KNEE ARTHROPLASTY Right 03/04/2018   Procedure: COMPUTER ASSISTED TOTAL KNEE ARTHROPLASTY;  Surgeon: Rod Can, MD;  Location: [facility];  Service: Orthopedics;  Laterality: Right;  . PTCA  01/2008    Current Medications: Current Meds  Medication Sig  . amLODipine (NORVASC) 10 MG tablet Take 1 tablet (10 mg total) by mouth daily.  Marland Kitchen atorvastatin (LIPITOR) 10 MG tablet Take 1 tablet (10 mg total) by mouth daily.  . Blood Glucose Monitoring Suppl (ACCU-CHEK AVIVA PLUS) w/Device KIT Test blood sugar 3 times daily. Dx code: E11.22  . docusate sodium (COLACE) 100 MG capsule Colace 100 mg capsule   100 mg by oral route.  . ferrous sulfate 325 (65 FE) MG tablet  Take 325 mg by mouth daily with breakfast.  . furosemide (LASIX) 40 MG tablet Take 1 tablet (40 mg total) by mouth 2 (two) times daily. As needed for swelling  . glucose blood (ACCU-CHEK AVIVA PLUS) test strip Test blood sugar 3 times daily. Dx code: E11.22  . insulin NPH-regular Human (NOVOLIN 70/30) (70-30) 100 UNIT/ML injection ADMINISTER 8 UNITS UNDER THE SKIN TWICE DAILY WITH A MEAL  . Insulin Syringe-Needle U-100 (B-D INS SYR HALF-UNIT .3CC/31G) 31G X 5/16" 0.3 ML MISC Use daily at bedtime to inject insulin. Dx code: 250.00  . Lancets (ACCU-CHEK MULTICLIX) lancets Test blood sugar 3  times daily. Dx code: E11.22  . lisinopril (ZESTRIL) 20 MG tablet TAKE 1 TABLET (20 MG TOTAL) BY MOUTH DAILY.  . metFORMIN (GLUCOPHAGE) 500 MG tablet   . metoprolol tartrate (LOPRESSOR) 50 MG tablet Take 1 tablet (50 mg total) by mouth 2 (two) times daily.  . mirabegron ER (MYRBETRIQ) 25 MG TB24 tablet Take 25 mg by mouth daily.   . Multiple Vitamin (MULTIVITAMIN) tablet Take 1 tablet by mouth daily. COMPLETE SENIOR VITAMINS AND M  . Omega-3 Fatty Acids (FISH OIL) 1000 MG CAPS Take 2 capsules by mouth daily.   . Vitamin D, Cholecalciferol, 1000 UNITS TABS Take 1 tablet by mouth daily.      Allergies:   Contrast media [iodinated diagnostic agents]   Social History   Socioeconomic History  . Marital status: Legally Separated    Spouse name: Not on file  . Number of children: 7  . Years of education: Not on file  . Highest education level: Not on file  Occupational History  . Occupation: RETIRED    Employer: RETIRED  Tobacco Use  . Smoking status: Former Smoker    Packs/day: 0.00    Years: 15.00    Pack years: 0.00    Types: Cigarettes    Quit date: 05/10/2017    Years since quitting: 2.3  . Smokeless tobacco: Never Used  . Tobacco comment: Patient smokes occasionally. Not everyday.  Substance and Sexual Activity  . Alcohol use: No  . Drug use: No  . Sexual activity: Never  Other Topics Concern  . Not on file  Social History Narrative   29 grandchildren. Single. Education: 12 th grade. Exercise: 3-4 times a week for 30 minutes working out and set ups.   Social Determinants of Health   Financial Resource Strain:   . Difficulty of Paying Living Expenses:   Food Insecurity:   . Worried About Charity fundraiser in the Last Year:   . Arboriculturist in the Last Year:   Transportation Needs:   . Film/video editor (Medical):   Marland Kitchen Lack of Transportation (Non-Medical):   Physical Activity:   . Days of Exercise per Week:   . Minutes of Exercise per Session:   Stress:   .  Feeling of Stress :   Social Connections:   . Frequency of Communication with Friends and Family:   . Frequency of Social Gatherings with Friends and Family:   . Attends Religious Services:   . Active Member of Clubs or Organizations:   . Attends Archivist Meetings:   Marland Kitchen Marital Status:      Family History: The patient's family history includes Diabetes in his brother and mother; Heart attack in his mother; Heart disease in his brother and sister; Leukemia in his father.  ROS:   Please see the history of present illness.  Pt is vaccinated  All other systems reviewed and are negative.  EKGs/Labs/Other Studies Reviewed:    The following studies were reviewed today:   EKG:  EKG is not ordered today.  The ekg ordered today demonstrates NSR- HR 73  Recent Labs: 02/09/2019: ALT 13 07/12/2019: Hemoglobin 12.1; Platelets 226 08/06/2019: BUN 20; Creatinine, Ser 2.10; NT-Pro BNP 431; Potassium 4.4; Sodium 145  Recent Lipid Panel    Component Value Date/Time   CHOL 118 02/09/2019 0917   TRIG 142 02/09/2019 0917   HDL 38 (L) 02/09/2019 0917   CHOLHDL 3.1 02/09/2019 0917   CHOLHDL 2.8 08/30/2015 0805   VLDL 20 08/30/2015 0805   LDLCALC 55 02/09/2019 0917    Physical Exam:    VS:  BP (!) 152/78   Pulse 86   Ht 6' (1.829 m)   Wt 232 lb 4.8 oz (105.4 kg)   SpO2 99%   BMI 31.51 kg/m     Wt Readings from Last 3 Encounters:  09/07/19 232 lb 4.8 oz (105.4 kg)  08/18/19 239 lb 6.4 oz (108.6 kg)  08/06/19 238 lb (108 kg)     GEN:  Well nourished, well developed in no acute distress HEENT: Normal NECK: No JVD; No carotid bruits CARDIAC: RRR, no murmurs, rubs, gallops RESPIRATORY:  Clear to auscultation without rales, wheezing or rhonchi  ABDOMEN: Soft, non-tender, non-distended MUSCULOSKELETAL:  Trace LE edema; No deformity  SKIN: Warm and dry NEUROLOGIC:  Alert and oriented x 3 PSYCHIATRIC:  Normal affect   ASSESSMENT:    CHF (congestive heart failure)  (HCC) Etiology not yet determined but he lost 7 lbs and improved with increased Lasix-echo pending  Essential hypertension, benign Repeat B/P by me 152/60- he has not taken his medications yet.  I asked him to follow his B/P at home- only take it after he has had his medications.  F/U in a few weeks- consider an addition agent if needed-(? Doxazosin).  Chronic renal insufficiency, stage 3 (moderate) CRRI 3B- followed at [place] pending today  CAD S/P percutaneous coronary angioplasty LAD PCI with DES 2009- Myoview low risk 2013. 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    Medication Adjustments/Labs and Tests Ordered: Current medicines are reviewed at length with the patient today.  Concerns regarding medicines are outlined above.  No orders of the defined types were placed in this encounter.  No orders of the defined types were placed in this encounter.   Patient Instructions  Medication Instructions:  Your physician recommends that you continue on your current medications as directed. Please refer to the Current Medication list given to you today.  *If you need a refill on your cardiac medications before your next appointment, please call your pharmacy*   Follow-Up: At [facility], you and your health needs are our priority.  As part of our continuing mission to provide you with exceptional heart care, we have created designated Provider Care Teams.  These Care Teams include your primary Cardiologist (physician) and Advanced Practice Providers (APPs -  Physician Assistants and Nurse Practitioners) who all work together to provide you with the care you need, when you need it.  We recommend signing up for the  patient portal called "MyChart".  Sign up information is provided on this After Visit Summary.  MyChart is used to connect with patients for Virtual Visits (Telemedicine).  Patients are able to view lab/test results, encounter notes, upcoming appointments, etc.  Non-urgent messages can be sent to your provider as well.   To learn more about what you can do with MyChart, go to [email or web address].    Your next appointment:   Monday, 10/04/19 at 8:45 AM.  The format for your next appointment:   Virtual Visit   Provider:   Kerin Ransom, PA-C   Other Instructions Your physician has requested that you regularly monitor and record your blood pressure readings at home, 3 times weekly, 1 hour after taking your medicatioons. Please record them to bring to your follow-up visit.       Angelena Form, PA-C  09/07/2019 9:28 AM    [facility]

## 2019-09-07 NOTE — Assessment & Plan Note (Signed)
CRRI 3B- followed at [place] pending today

## 2019-09-07 NOTE — Assessment & Plan Note (Signed)
Repeat B/P by me 152/60- he has not taken his medications yet.  I asked him to follow his B/P at home- only take it after he has had his medications.  F/U in a few weeks- consider an addition agent if needed-(? Doxazosin).

## 2019-09-07 NOTE — Progress Notes (Signed)
bmet  

## 2019-09-07 NOTE — Patient Instructions (Addendum)
Medication Instructions:  Your physician recommends that you continue on your current medications as directed. Please refer to the Current Medication list given to you today.  *If you need a refill on your cardiac medications before your next appointment, please call your pharmacy*   Follow-Up: At [facility], you and your health needs are our priority.  As part of our continuing mission to provide you with exceptional heart care, we have created designated Provider Care Teams.  These Care Teams include your primary Cardiologist (physician) and Advanced Practice Providers (APPs -  Physician Assistants and Nurse Practitioners) who all work together to provide you with the care you need, when you need it.  We recommend signing up for the patient portal called "MyChart".  Sign up information is provided on this After Visit Summary.  MyChart is used to connect with patients for Virtual Visits (Telemedicine).  Patients are able to view lab/test results, encounter notes, upcoming appointments, etc.  Non-urgent messages can be sent to your provider as well.   To learn more about what you can do with MyChart, go to [email or web address].    Your next appointment:   Monday, 10/04/19 at 8:45 AM.  The format for your next appointment:   Virtual Visit   Provider:   Kerin Ransom, PA-C   Other Instructions Your physician has requested that you regularly monitor and record your blood pressure readings at home, 3 times weekly, 1 hour after taking your medicatioons. Please record them to bring to your follow-up visit.

## 2019-09-07 NOTE — Assessment & Plan Note (Signed)
With nephropathy

## 2019-09-07 NOTE — Assessment & Plan Note (Signed)
LDL 55 Nov 2020

## 2019-09-07 NOTE — Assessment & Plan Note (Signed)
LAD PCI with DES 2009- Myoview low risk 2013. No c/o of angina

## 2019-09-07 NOTE — Assessment & Plan Note (Signed)
Unsuccessful RFA 2010- no recurrence

## 2019-09-07 NOTE — Assessment & Plan Note (Signed)
Etiology not yet determined but he lost 7 lbs and improved with increased Lasix-echo pending

## 2019-09-08 ENCOUNTER — Ambulatory Visit (HOSPITAL_COMMUNITY): Payer: Medicare HMO | Attending: Cardiology

## 2019-09-08 DIAGNOSIS — I10 Essential (primary) hypertension: Secondary | ICD-10-CM | POA: Diagnosis not present

## 2019-09-08 DIAGNOSIS — I2510 Atherosclerotic heart disease of native coronary artery without angina pectoris: Secondary | ICD-10-CM | POA: Diagnosis not present

## 2019-09-08 DIAGNOSIS — R0609 Other forms of dyspnea: Secondary | ICD-10-CM

## 2019-09-08 DIAGNOSIS — R0600 Dyspnea, unspecified: Secondary | ICD-10-CM | POA: Diagnosis not present

## 2019-09-08 DIAGNOSIS — I472 Ventricular tachycardia: Secondary | ICD-10-CM | POA: Diagnosis not present

## 2019-09-08 DIAGNOSIS — I4729 Other ventricular tachycardia: Secondary | ICD-10-CM

## 2019-09-08 LAB — HEMOGLOBIN A1C
Est. average glucose Bld gHb Est-mCnc: 220 mg/dL
Hgb A1c MFr Bld: 9.3 % — ABNORMAL HIGH (ref 4.8–5.6)

## 2019-09-09 ENCOUNTER — Telehealth: Payer: Self-pay

## 2019-09-09 DIAGNOSIS — C61 Malignant neoplasm of prostate: Secondary | ICD-10-CM | POA: Diagnosis not present

## 2019-09-09 DIAGNOSIS — I350 Nonrheumatic aortic (valve) stenosis: Secondary | ICD-10-CM

## 2019-09-09 DIAGNOSIS — N138 Other obstructive and reflux uropathy: Secondary | ICD-10-CM | POA: Diagnosis not present

## 2019-09-09 DIAGNOSIS — C649 Malignant neoplasm of unspecified kidney, except renal pelvis: Secondary | ICD-10-CM | POA: Diagnosis not present

## 2019-09-09 DIAGNOSIS — N401 Enlarged prostate with lower urinary tract symptoms: Secondary | ICD-10-CM | POA: Diagnosis not present

## 2019-09-09 DIAGNOSIS — C679 Malignant neoplasm of bladder, unspecified: Secondary | ICD-10-CM | POA: Diagnosis not present

## 2019-09-09 DIAGNOSIS — N529 Male erectile dysfunction, unspecified: Secondary | ICD-10-CM | POA: Diagnosis not present

## 2019-09-09 DIAGNOSIS — I509 Heart failure, unspecified: Secondary | ICD-10-CM

## 2019-09-09 NOTE — Telephone Encounter (Signed)
Spoke to patient echo results given.Advised to repeat in 12 months. 

## 2019-09-21 ENCOUNTER — Ambulatory Visit: Payer: Medicare HMO | Admitting: Registered Nurse

## 2019-09-21 ENCOUNTER — Other Ambulatory Visit: Payer: Self-pay

## 2019-09-21 VITALS — BP 145/76

## 2019-09-21 DIAGNOSIS — I10 Essential (primary) hypertension: Secondary | ICD-10-CM

## 2019-10-04 ENCOUNTER — Encounter: Payer: Self-pay | Admitting: Cardiology

## 2019-10-04 ENCOUNTER — Telehealth (INDEPENDENT_AMBULATORY_CARE_PROVIDER_SITE_OTHER): Payer: Medicare HMO | Admitting: Cardiology

## 2019-10-04 ENCOUNTER — Telehealth: Payer: Self-pay | Admitting: Cardiology

## 2019-10-04 VITALS — BP 134/77 | HR 84 | Ht 72.0 in

## 2019-10-04 DIAGNOSIS — Z8679 Personal history of other diseases of the circulatory system: Secondary | ICD-10-CM

## 2019-10-04 DIAGNOSIS — Z794 Long term (current) use of insulin: Secondary | ICD-10-CM

## 2019-10-04 DIAGNOSIS — E119 Type 2 diabetes mellitus without complications: Secondary | ICD-10-CM | POA: Diagnosis not present

## 2019-10-04 DIAGNOSIS — N183 Chronic kidney disease, stage 3 unspecified: Secondary | ICD-10-CM | POA: Diagnosis not present

## 2019-10-04 DIAGNOSIS — Z9861 Coronary angioplasty status: Secondary | ICD-10-CM

## 2019-10-04 DIAGNOSIS — R600 Localized edema: Secondary | ICD-10-CM

## 2019-10-04 DIAGNOSIS — I10 Essential (primary) hypertension: Secondary | ICD-10-CM | POA: Diagnosis not present

## 2019-10-04 DIAGNOSIS — I2510 Atherosclerotic heart disease of native coronary artery without angina pectoris: Secondary | ICD-10-CM | POA: Diagnosis not present

## 2019-10-04 MED ORDER — CARVEDILOL 25 MG PO TABS
25.0000 mg | ORAL_TABLET | Freq: Two times a day (BID) | ORAL | 3 refills | Status: DC
Start: 2019-10-04 — End: 2019-11-23

## 2019-10-04 NOTE — Patient Instructions (Signed)
Medication Instructions:   STOP Metoprolol  START Carvedilol 25 mg twice daily.  *If you need a refill on your cardiac medications before your next appointment, please call your pharmacy*   Follow-Up: At [facility], you and your health needs are our priority.  As part of our continuing mission to provide you with exceptional heart care, we have created designated Provider Care Teams.  These Care Teams include your primary Cardiologist (physician) and Advanced Practice Providers (APPs -  Physician Assistants and Nurse Practitioners) who all work together to provide you with the care you need, when you need it.  We recommend signing up for the patient portal called "MyChart".  Sign up information is provided on this After Visit Summary.  MyChart is used to connect with patients for Virtual Visits (Telemedicine).  Patients are able to view lab/test results, encounter notes, upcoming appointments, etc.  Non-urgent messages can be sent to your provider as well.   To learn more about what you can do with MyChart, go to [email or web address].    Your next appointment:   6 month(s)  The format for your next appointment:   In Person  Provider:   You may see Quay Burow, MD or one of the following Advanced Practice Providers on your designated Care Team:    Kerin Ransom, PA-C  [place], [place]  Coletta Memos, [place]    Other Instructions Please call our office 2 months in advance to schedule your follow-up appointment with Dr. [name].

## 2019-10-04 NOTE — Progress Notes (Signed)
Virtual Visit via Video Note   This visit type was conducted due to national recommendations for restrictions regarding the COVID-19 Pandemic (e.g. social distancing) in an effort to limit this patient's exposure and mitigate transmission in our community.  Due to his co-morbid illnesses, this patient is at least at moderate risk for complications without adequate follow up.  This format is felt to be most appropriate for this patient at this time.  All issues noted in this document were discussed and addressed.  A limited physical exam was performed with this format.  Please refer to the patient's chart for his consent to telehealth for [facility].   The patient was identified using 2 identifiers.  Date:  10/04/2019   ID:  [name], DOB June 29, 1933, MRN [id number]  Patient Location: Home Provider Location: Home  PCP:  [name], MD  Cardiologist:  Quay Burow, MD  Electrophysiologist:  Thompson Grayer, MD   Evaluation Performed:  Follow-Up Visit  Chief Complaint:  none  History of Present Illness:    [name] is a pleasant 86 y.o. male with a hx of coronary disease, status post remote LAD PCI with DES in 2009.  Myoview in 2013 was low risk.  He also has a history of ventricular tachycardia and underwent an attempted ablation in 2010 but this was unsuccessful.  Fortunately is not had recurrent problems with this.  Other medical issues include insulin-dependent diabetes, hypertension, dyslipidemia, prostate cancer followed by Dr [name] in [facility], and chronic renal insufficiency stage IIIb followed at [facility].   The patient was seen by Dr. [name] 08/18/2019.  He complained of lower extremity edema and increasing shortness of breath with exertion.  The patient had recently been placed on Lasix 40 mg daily.  Dr. [name] increased this to 40 mg twice daily.  I saw him in follow up 09/07/2019 and he reported improvement in his LE edema and a drop in his weight of  7 lbs. Dr. [name] did order an echocardiogram and BMP but does results were not available bowl to me on the 09/07/2019 office visit.  I asked the patient to monitor his blood pressure at home and that I would contact him in a couple weeks for follow-up after I could review his echocardiogram and lab work.  His blood pressure when I saw him on 09/07/2019 was elevated but he told me he had not taken his medicines yet.  Patient was contacted today for follow-up.  He is monitoring his blood pressure at home, his systolic consistently runs in the mid 130s.  His edema remains under control.  His weight is stable.  His echocardiogram showed normal LV function with moderate LVH and grade 1 diastolic dysfunction.  He also has mild to moderate AS and AR.  His creatinine on 09/07/2019 was stable at 2.09, in April it was 2.10.  The patient does not have symptoms concerning for COVID-19 infection (fever, chills, cough, or new shortness of breath).    Past Medical History:  Diagnosis Date  . Allergy   . Anemia   . CAD (coronary artery disease)    pci to LAD 10/09; stable CAD by cath 05/31/08; myoview 04/11/11- no ishcemia, EF 67%; echo 05/15/09- EF>55%, mod calcification of the aortic valve leaflets  . CAD S/P percutaneous coronary angioplasty 05/08/2008   LAD PCI with DES 2009- Myoview low risk 2013  . CHF (congestive heart failure) ([place]) 09/07/2019  . Chronic back pain   . Cystitis, radiation  12/01/2013  . ED (erectile dysfunction)   . Elevated serum creatinine 12/14/2015  . Essential hypertension, benign   . Hematuria 12/01/2013  . Hyperlipidemia   . Ileus ([place]) 12/14/2015  . Insulin dependent type 2 diabetes mellitus, controlled ([place])   . Malignant neoplasm of prostate ([place]) 11/26/2013  . Multiple rib fractures    left 9th, 10th, and 11th posterior rib fractures S/P fall 12/09/2015/notes 12/12/2015  . Osteoarthritis of right knee 03/04/2018  . PREMATURE VENTRICULAR CONTRACTIONS 05/08/2008   Qualifier: Diagnosis of  By:  Rayann Heman, MD, Jeneen Rinks    . Prostate cancer ([place])    s/p  . Rectal bleeding 12/23/2012   Evaluated by Dr Benson [place] GI patient with history of radiation proctitis. Patient due for colonoscopy on 04/2014   . Type II or unspecified type diabetes mellitus without mention of complication, not stated as uncontrolled   . Ventricular tachycardia ([place])    resuscitated; monitor 05/2008; attempted T ablation 2/10- aborted due to inappropriate substrate   Past Surgical History:  Procedure Laterality Date  . CARDIAC CATHETERIZATION  05/31/08   EF 55-60%, stable lesions, medical therapy  . COLECTOMY  '80's   polyps  . CORONARY ANGIOPLASTY WITH STENT PLACEMENT  01/19/08   PCI stent to mid LAD with Promus DES 27.5x28  . IR GENERIC HISTORICAL  01/03/2016   IR THORACENTESIS ASP PLEURAL SPACE W/IMG GUIDE 01/03/2016 [facility]-INTERV RAD  . KNEE ARTHROPLASTY Right 03/04/2018   Procedure: COMPUTER ASSISTED TOTAL KNEE ARTHROPLASTY;  Surgeon: Rod Can, MD;  Location: [facility] ORS;  Service: Orthopedics;  Laterality: Right;  . PTCA  01/2008     Current Meds  Medication Sig  . amLODipine (NORVASC) 10 MG tablet Take 1 tablet (10 mg total) by mouth daily.  Marland Kitchen atorvastatin (LIPITOR) 10 MG tablet Take 1 tablet (10 mg total) by mouth daily.  . Blood Glucose Monitoring Suppl (ACCU-CHEK AVIVA PLUS) w/Device KIT Test blood sugar 3 times daily. Dx code: E11.22  . docusate sodium (COLACE) 100 MG capsule Colace 100 mg capsule   100 mg by oral route.  . ferrous sulfate 325 (65 FE) MG tablet Take 325 mg by mouth daily with breakfast.  . furosemide (LASIX) 40 MG tablet Take 1 tablet (40 mg total) by mouth 2 (two) times daily. As needed for swelling  . glucose blood (ACCU-CHEK AVIVA PLUS) test strip Test blood sugar 3 times daily. Dx code: E11.22  . insulin NPH-regular Human (NOVOLIN 70/30) (70-30) 100 UNIT/ML injection ADMINISTER 8 UNITS UNDER THE SKIN TWICE DAILY WITH A MEAL  . Insulin Syringe-Needle U-100 (B-D INS SYR HALF-UNIT .3CC/31G) 31G  X 5/16" 0.3 ML MISC Use daily at bedtime to inject insulin. Dx code: 250.00  . Lancets (ACCU-CHEK MULTICLIX) lancets Test blood sugar 3 times daily. Dx code: E11.22  . lisinopril (ZESTRIL) 20 MG tablet TAKE 1 TABLET (20 MG TOTAL) BY MOUTH DAILY.  . metFORMIN (GLUCOPHAGE) 500 MG tablet   . metoprolol tartrate (LOPRESSOR) 50 MG tablet Take 1 tablet (50 mg total) by mouth 2 (two) times daily.  . mirabegron ER (MYRBETRIQ) 25 MG TB24 tablet Take 25 mg by mouth daily.   . Multiple Vitamin (MULTIVITAMIN) tablet Take 1 tablet by mouth daily. COMPLETE SENIOR VITAMINS AND M  . Omega-3 Fatty Acids (FISH OIL) 1000 MG CAPS Take 2 capsules by mouth daily.   . Vitamin D, Cholecalciferol, 1000 UNITS TABS Take 1 tablet by mouth daily.      Allergies:   Contrast media [iodinated diagnostic agents]   Social History  Tobacco Use  . Smoking status: Former Smoker    Packs/day: 0.00    Years: 15.00    Pack years: 0.00    Types: Cigarettes    Quit date: 05/10/2017    Years since quitting: 2.4  . Smokeless tobacco: Never Used  . Tobacco comment: Patient smokes occasionally. Not everyday.  Vaping Use  . Vaping Use: Never used  Substance Use Topics  . Alcohol use: No  . Drug use: No     Family Hx: The patient's family history includes Diabetes in his brother and mother; Heart attack in his mother; Heart disease in his brother and sister; Leukemia in his father.  ROS:   Please see the history of present illness.    All other systems reviewed and are negative.   Prior CV studies:   The following studies were reviewed today:    Labs/Other Tests and Data Reviewed:    EKG:  An ECG dated 08/06/2019 was personally reviewed today and demonstrated:  NSR-HR 73  Recent Labs: 02/09/2019: ALT 13 07/12/2019: Hemoglobin 12.1; Platelets 226 08/06/2019: NT-Pro BNP 431 09/07/2019: BUN 29; Creatinine, Ser 2.09; Potassium 4.5; Sodium 141   Recent Lipid Panel Lab Results  Component Value Date/Time   CHOL 118  02/09/2019 09:17 AM   TRIG 142 02/09/2019 09:17 AM   HDL 38 (L) 02/09/2019 09:17 AM   CHOLHDL 3.1 02/09/2019 09:17 AM   CHOLHDL 2.8 08/30/2015 08:05 AM   LDLCALC 55 02/09/2019 09:17 AM    Wt Readings from Last 3 Encounters:  09/07/19 232 lb 4.8 oz (105.4 kg)  08/18/19 239 lb 6.4 oz (108.6 kg)  08/06/19 238 lb (108 kg)     Objective:    Vital Signs:  BP 134/77 Comment: yesterday  Pulse 84 Comment: yesterday  Ht 6' (1.829 m)   BMI 31.51 kg/m    VITAL SIGNS:  reviewed  ASSESSMENT & PLAN:    Essential hypertension- I think with his multiple medical problems-CAD, IDDM, AS/AR,  and CRI-he would benefit from tighter B/P control.  I suggested we change his Metoprolol to Carvedilol.  He will continue to monitor his B/P 3 x week.   Chronic renal insufficiency, stage 3 (moderate) CRRI 3B- followed at [facility]- BMP stable- 09/07/2019 on Lasix 40 mg BID- continue and keep f/u as scheduled next month.    CAD S/P percutaneous coronary angioplasty LAD PCI with DES 2009- Myoview low risk 2013. 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 Followed by Dr Carlota Raspberry.  Dyslipidemia, goal LDL below 70 LDL 55 Nov 2020  History of ventricular tachycardia Unsuccessful RFA 2010- no recurrence   COVID-19 Education: The signs and symptoms of COVID-19 were discussed with the patient and how to seek care for testing (follow up with PCP or arrange E-visit).  The importance of social distancing was discussed today. He and his wife have been vaccinated.   Time:   Today, I have spent 20 minutes with the patient with telehealth technology discussing the above problems.     Medication Adjustments/Labs and Tests Ordered: Current medicines are reviewed at length with the patient today.  Concerns regarding medicines are outlined above.   Tests Ordered: No orders of the defined types were placed in this encounter.   Medication Changes: No  orders of the defined types were placed in this encounter.   Follow Up:  In Person Dr [name] in 6 months  Signed, Kerin Ransom, PA-C  10/04/2019 9:03 AM  [facility]

## 2019-10-04 NOTE — Telephone Encounter (Signed)
    Pt c/o medication issue:  1. Name of Medication: new prescriptions  2. How are you currently taking this medication (dosage and times per day)?   3. Are you having a reaction (difficulty breathing--STAT)?   4. What is your medication issue? Pt said during his VV with Kerin Ransom today he changed his medication and he has questions about it

## 2019-10-07 ENCOUNTER — Other Ambulatory Visit: Payer: Self-pay

## 2019-10-07 ENCOUNTER — Ambulatory Visit (INDEPENDENT_AMBULATORY_CARE_PROVIDER_SITE_OTHER): Payer: Medicare HMO | Admitting: Family Medicine

## 2019-10-07 ENCOUNTER — Encounter: Payer: Self-pay | Admitting: Family Medicine

## 2019-10-07 VITALS — BP 136/70 | HR 73 | Temp 98.4°F | Ht 72.0 in | Wt 231.0 lb

## 2019-10-07 DIAGNOSIS — E1165 Type 2 diabetes mellitus with hyperglycemia: Secondary | ICD-10-CM | POA: Diagnosis not present

## 2019-10-07 DIAGNOSIS — E785 Hyperlipidemia, unspecified: Secondary | ICD-10-CM | POA: Diagnosis not present

## 2019-10-07 DIAGNOSIS — Z794 Long term (current) use of insulin: Secondary | ICD-10-CM | POA: Diagnosis not present

## 2019-10-07 DIAGNOSIS — E1121 Type 2 diabetes mellitus with diabetic nephropathy: Secondary | ICD-10-CM | POA: Diagnosis not present

## 2019-10-07 DIAGNOSIS — E1151 Type 2 diabetes mellitus with diabetic peripheral angiopathy without gangrene: Secondary | ICD-10-CM | POA: Diagnosis not present

## 2019-10-07 DIAGNOSIS — E1122 Type 2 diabetes mellitus with diabetic chronic kidney disease: Secondary | ICD-10-CM | POA: Diagnosis not present

## 2019-10-07 DIAGNOSIS — N1832 Chronic kidney disease, stage 3b: Secondary | ICD-10-CM

## 2019-10-07 LAB — COMPREHENSIVE METABOLIC PANEL
ALT: 15 IU/L (ref 0–44)
AST: 12 IU/L (ref 0–40)
Albumin/Globulin Ratio: 1.3 (ref 1.2–2.2)
Albumin: 3.6 g/dL (ref 3.6–4.6)
Alkaline Phosphatase: 56 IU/L (ref 48–121)
BUN/Creatinine Ratio: 11 (ref 10–24)
BUN: 22 mg/dL (ref 8–27)
Bilirubin Total: 0.3 mg/dL (ref 0.0–1.2)
CO2: 22 mmol/L (ref 20–29)
Calcium: 9 mg/dL (ref 8.6–10.2)
Chloride: 104 mmol/L (ref 96–106)
Creatinine, Ser: 1.98 mg/dL — ABNORMAL HIGH (ref 0.76–1.27)
GFR calc Af Amer: 35 mL/min/{1.73_m2} — ABNORMAL LOW (ref >59)
GFR calc non Af Amer: 30 mL/min/{1.73_m2} — ABNORMAL LOW (ref >59)
Globulin, Total: 2.8 g/dL (ref 1.5–4.5)
Glucose: 156 mg/dL — ABNORMAL HIGH (ref 65–99)
Potassium: 4.6 mmol/L (ref 3.5–5.2)
Sodium: 138 mmol/L (ref 134–144)
Total Protein: 6.4 g/dL (ref 6.0–8.5)

## 2019-10-07 LAB — LIPID PANEL
Chol/HDL Ratio: 3.5 ratio (ref 0.0–5.0)
Cholesterol, Total: 152 mg/dL (ref 100–199)
HDL: 43 mg/dL (ref >39)
LDL Chol Calc (NIH): 83 mg/dL (ref 0–99)
Triglycerides: 152 mg/dL — ABNORMAL HIGH (ref 0–149)
VLDL Cholesterol Cal: 26 mg/dL (ref 5–40)

## 2019-10-07 LAB — GLUCOSE, POCT (MANUAL RESULT ENTRY): POC Glucose: 158 mg/dl — AB (ref 70–99)

## 2019-10-07 MED ORDER — ATORVASTATIN CALCIUM 10 MG PO TABS: 10.0000 mg | ORAL_TABLET | Freq: Every day | ORAL | 1 refills | Status: DC

## 2019-10-07 NOTE — Patient Instructions (Addendum)
Call and schedule eye doctor appointment - make sure they send me a note.  I will check other diabetes test to decide on med changes. Continue to work on diet choices for diabetes. See info below to see if that helps. I am happy to refer you to nutrition if more assistance with food choices needed.    Diabetes Mellitus and Nutrition, Adult When you have diabetes (diabetes mellitus), it is very important to have healthy eating habits because your blood sugar (glucose) levels are greatly affected by what you eat and drink. Eating healthy foods in the appropriate amounts, at about the same times every day, can help you:  Control your blood glucose.  Lower your risk of heart disease.  Improve your blood pressure.  Reach or maintain a healthy weight. Every person with diabetes is different, and each person has different needs for a meal plan. Your health care provider may recommend that you work with a diet and nutrition specialist (dietitian) to make a meal plan that is best for you. Your meal plan may vary depending on factors such as:  The calories you need.  The medicines you take.  Your weight.  Your blood glucose, blood pressure, and cholesterol levels.  Your activity level.  Other health conditions you have, such as heart or kidney disease. How do carbohydrates affect me? Carbohydrates, also called carbs, affect your blood glucose level more than any other type of food. Eating carbs naturally raises the amount of glucose in your blood. Carb counting is a method for keeping track of how many carbs you eat. Counting carbs is important to keep your blood glucose at a healthy level, especially if you use insulin or take certain oral diabetes medicines. It is important to know how many carbs you can safely have in each meal. This is different for every person. Your dietitian can help you calculate how many carbs you should have at each meal and for each snack. Foods that contain carbs  include:  Bread, cereal, rice, pasta, and crackers.  Potatoes and corn.  Peas, beans, and lentils.  Milk and yogurt.  Fruit and juice.  Desserts, such as cakes, cookies, ice cream, and candy. How does alcohol affect me? Alcohol can cause a sudden decrease in blood glucose (hypoglycemia), especially if you use insulin or take certain oral diabetes medicines. Hypoglycemia can be a life-threatening condition. Symptoms of hypoglycemia (sleepiness, dizziness, and confusion) are similar to symptoms of having too much alcohol. If your health care provider says that alcohol is safe for you, follow these guidelines:  Limit alcohol intake to no more than 1 drink per day for nonpregnant women and 2 drinks per day for men. One drink equals 12 oz of beer, 5 oz of wine, or 1 oz of hard liquor.  Do not drink on an empty stomach.  Keep yourself hydrated with water, diet soda, or unsweetened iced tea.  Keep in mind that regular soda, juice, and other mixers may contain a lot of sugar and must be counted as carbs. What are tips for following this plan?  Reading food labels  Start by checking the serving size on the "Nutrition Facts" label of packaged foods and drinks. The amount of calories, carbs, fats, and other nutrients listed on the label is based on one serving of the item. Many items contain more than one serving per package.  Check the total grams (g) of carbs in one serving. You can calculate the number of servings of carbs in  one serving by dividing the total carbs by 15. For example, if a food has 30 g of total carbs, it would be equal to 2 servings of carbs.  Check the number of grams (g) of saturated and trans fats in one serving. Choose foods that have low or no amount of these fats.  Check the number of milligrams (mg) of salt (sodium) in one serving. Most people should limit total sodium intake to less than 2,300 mg per day.  Always check the nutrition information of foods labeled  as "low-fat" or "nonfat". These foods may be higher in added sugar or refined carbs and should be avoided.  Talk to your dietitian to identify your daily goals for nutrients listed on the label. Shopping  Avoid buying canned, premade, or processed foods. These foods tend to be high in fat, sodium, and added sugar.  Shop around the outside edge of the grocery store. This includes fresh fruits and vegetables, bulk grains, fresh meats, and fresh dairy. Cooking  Use low-heat cooking methods, such as baking, instead of high-heat cooking methods like deep frying.  Cook using healthy oils, such as olive, canola, or sunflower oil.  Avoid cooking with butter, cream, or high-fat meats. Meal planning  Eat meals and snacks regularly, preferably at the same times every day. Avoid going long periods of time without eating.  Eat foods high in fiber, such as fresh fruits, vegetables, beans, and whole grains. Talk to your dietitian about how many servings of carbs you can eat at each meal.  Eat 4-6 ounces (oz) of lean protein each day, such as lean meat, chicken, fish, eggs, or tofu. One oz of lean protein is equal to: ? 1 oz of meat, chicken, or fish. ? 1 egg. ?  cup of tofu.  Eat some foods each day that contain healthy fats, such as avocado, nuts, seeds, and fish. Lifestyle  Check your blood glucose regularly.  Exercise regularly as told by your health care provider. This may include: ? 150 minutes of moderate-intensity or vigorous-intensity exercise each week. This could be brisk walking, biking, or water aerobics. ? Stretching and doing strength exercises, such as yoga or weightlifting, at least 2 times a week.  Take medicines as told by your health care provider.  Do not use any products that contain nicotine or tobacco, such as cigarettes and e-cigarettes. If you need help quitting, ask your health care provider.  Work with a Social worker or diabetes educator to identify strategies to  manage stress and any emotional and social challenges. Questions to ask a health care provider  Do I need to meet with a diabetes educator?  Do I need to meet with a dietitian?  What number can I call if I have questions?  When are the best times to check my blood glucose? Where to find more information:  American Diabetes Association: diabetes.org  Academy of Nutrition and Dietetics: www.eatright.[employer] of Diabetes and Digestive and Kidney Diseases (NIH): [employer] Summary  A healthy meal plan will help you control your blood glucose and maintain a healthy lifestyle.  Working with a diet and nutrition specialist (dietitian) can help you make a meal plan that is best for you.  Keep in mind that carbohydrates (carbs) and alcohol have immediate effects on your blood glucose levels. It is important to count carbs and to use alcohol carefully. This information is not intended to replace advice given to you by your health care provider. Make sure you discuss any  questions you have with your health care provider. Document Revised: 03/07/2017 Document Reviewed: 04/29/2016 Elsevier Patient Education  [employer].     If you have lab work done today you will be contacted with your lab results within the next 2 weeks.  If you have not heard from [place] then please contact us. The fastest way to get your results is to register for My Chart.   IF you received an x-ray today, you will receive an invoice from [facility]. Please contact [facility] at [phone] with questions or concerns regarding your invoice.   IF you received labwork today, you will receive an invoice from [facility]. Please contact [facility] at [phone] with questions or concerns regarding your invoice.   Our billing staff will not be able to assist you with questions regarding bills from these companies.  You will be contacted with the lab results as soon as they are  available. The fastest way to get your results is to activate your My Chart account. Instructions are located on the last page of this paperwork. If you have not heard from [place] regarding the results in 2 weeks, please contact this office.

## 2019-10-07 NOTE — Progress Notes (Signed)
Subjective:  Patient ID: [name], male    DOB: 1933/05/05  Age: 86 y.o. MRN: [id number]  CC:  Chief Complaint  Patient presents with  . lab results    Pt is here to go over lab results from his last OV with the provider.    HPI [name] presents for   Diabetes: Associated with nephropathy,microalbuminuris, CAD,  followed by Dr. Royce Macadamia with nephrology, Dr. [name] with cardiology.  CKD stage IIIb. Previously had to decrease his Metformin, then stop it altogether with his CKD.  Was on 8 units of insulin 70/30 twice daily at February visit.  Some concern regarding his home readings compared to in office testing.  Microalbumin: Elevated ratio of 345 in July 2020. Optho, foot exam, pneumovax:  Due for optho visit.   Still using 8 units of 70/30 twice per day. No missed doses.  Thinks he is eating the wrong food at night. Shrimp fried rice, honey and orange chicken.  Glucose 388 next day, then 140 that night.  Less adherence to diet in past 6 months, but since visit for labs on June 1st has improved. now home readings below 130's, and states 2hr postprandials 119-130. Better diet past month.  No symptomatic lows.  No added sugar to foods- artificial sweetener. Oatmeal for breakfast.   Metoprolol changed to carvedilol at recent cardiology visit.  Down 7# since May 12th with walking and diet changes.  Fasting today.  Wt Readings from Last 3 Encounters:  10/07/19 231 lb (104.8 kg)  09/07/19 232 lb 4.8 oz (105.4 kg)  08/18/19 239 lb 6.4 oz (108.6 kg)     Lab Results  Component Value Date   HGBA1C 9.3 (H) 09/07/2019   HGBA1C 8.6 (H) 04/22/2019   HGBA1C 7.4 (H) 01/18/2019   Lab Results  Component Value Date   MICROALBUR 83.3 05/17/2015   LDLCALC 55 02/09/2019   CREATININE 2.09 (H) 09/07/2019   Hyperlipidemia: lipitor 41m qd. Hx CAD, recent cardiology eval as above. No CP. Leg swelling improved.  Lab Results  Component Value Date   CHOL 118 02/09/2019   HDL  38 (L) 02/09/2019   LDLCALC 55 02/09/2019   TRIG 142 02/09/2019   CHOLHDL 3.1 02/09/2019   Lab Results  Component Value Date   ALT 13 02/09/2019   AST 12 02/09/2019   ALKPHOS 46 02/09/2019   BILITOT 0.2 02/09/2019     History Patient Active Problem List   Diagnosis Date Noted  . CHF (congestive heart failure) ([place] 09/07/2019  . Chronic renal insufficiency, stage 3 (moderate) 09/07/2019  . Lower extremity edema 08/18/2019  . Osteoarthritis of right knee 03/04/2018  . Rib pain on left side 07/01/2016  . Fall 12/14/2015  . Elevated serum creatinine 12/14/2015  . Ileus ([place] 12/14/2015  . Multiple fractures of ribs of left side 12/12/2015  . Prostate cancer ([place] 05/26/2014  . Blood in the urine 12/01/2013  . Cystitis, radiation 12/01/2013  . Hematuria 12/01/2013  . 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 Male erectile dysfunction 11/26/2013  . Pulsatile tinnitus 11/19/2013  . Rectal bleeding 12/23/2012  . Insulin dependent type 2 diabetes mellitus, controlled ([place]   . Essential hypertension   . Chronic back pain   . ED (erectile dysfunction)   . Anemia   . Dyslipidemia, goal LDL below 70   . CAD S/P percutaneous coronary angioplasty 05/08/2008  . History of ventricular tachycardia 05/08/2008  . PREMATURE VENTRICULAR CONTRACTIONS 05/08/2008   Past Medical History:  Diagnosis Date  .  Allergy   . Anemia   . CAD (coronary artery disease)    pci to LAD 10/09; stable CAD by cath 05/31/08; myoview 04/11/11- no ishcemia, EF 67%; echo 05/15/09- EF>55%, mod calcification of the aortic valve leaflets  . CAD S/P percutaneous coronary angioplasty 05/08/2008   LAD PCI with DES 2009- Myoview low risk 2013  . CHF (congestive heart failure) ([place]) 09/07/2019  . Chronic back pain   . Cystitis, radiation 12/01/2013  . ED (erectile dysfunction)   . Elevated serum creatinine 12/14/2015  . Essential hypertension, benign   . Hematuria 12/01/2013  . Hyperlipidemia   . Ileus ([place]) 12/14/2015  .  Insulin dependent type 2 diabetes mellitus, controlled ([place])   . Malignant neoplasm of prostate ([place]) 11/26/2013  . Multiple rib fractures    left 9th, 10th, and 11th posterior rib fractures S/P fall 12/09/2015/notes 12/12/2015  . Osteoarthritis of right knee 03/04/2018  . PREMATURE VENTRICULAR CONTRACTIONS 05/08/2008   Qualifier: Diagnosis of  By: Rayann Heman, MD, Jeneen Rinks    . Prostate cancer ([place])    s/p  . Rectal bleeding 12/23/2012   Evaluated by Dr Benson [place] GI patient with history of radiation proctitis. Patient due for colonoscopy on 04/2014   . Type II or unspecified type diabetes mellitus without mention of complication, not stated as uncontrolled   . Ventricular tachycardia ([place])    resuscitated; monitor 05/2008; attempted T ablation 2/10- aborted due to inappropriate substrate   Past Surgical History:  Procedure Laterality Date  . CARDIAC CATHETERIZATION  05/31/08   EF 55-60%, stable lesions, medical therapy  . COLECTOMY  '80's   polyps  . CORONARY ANGIOPLASTY WITH STENT PLACEMENT  01/19/08   PCI stent to mid LAD with Promus DES 27.5x28  . IR GENERIC HISTORICAL  01/03/2016   IR THORACENTESIS ASP PLEURAL SPACE W/IMG GUIDE 01/03/2016 [facility]INTER[facility]  . KNEE ARTHROPLASTY Right 03/04/2018   Procedure: COMPUTER ASSISTED TOTAL KNEE ARTHROPLASTY;  Surgeon: Rod Can, MD;  Location: [facility];  Service: Orthopedics;  Laterality: Right;  . PTCA  01/2008   Allergies  Allergen Reactions  . Contrast Media [Iodinated Diagnostic Agents] Swelling and Other (See Comments)    Possible neck swelling after contrast for chest CT 03/2016. No hives, throat or other symptoms   Prior to Admission medications   Medication Sig Start Date End Date Taking? Authorizing Provider  amLODipine (NORVASC) 10 MG tablet Take 1 tablet (10 mg total) by mouth daily. 04/22/19  Yes [name], MD  atorvastatin (LIPITOR) 10 MG tablet Take 1 tablet (10 mg total) by mouth daily. 04/22/19  Yes [name], MD  Blood Glucose  Monitoring Suppl (ACCU-CHEK AVIVA PLUS) w/Device KIT Test blood sugar 3 times daily. Dx code: E11.22 04/03/16  Yes [name], MD  carvedilol (COREG) 25 MG tablet Take 1 tablet (25 mg total) by mouth 2 (two) times daily. 10/04/19 01/02/20 Yes Kilroy, Luke K, PA-C  docusate sodium (COLACE) 100 MG capsule Colace 100 mg capsule   100 mg by oral route. 03/04/18  Yes [provider]  ferrous sulfate 325 (65 FE) MG tablet Take 325 mg by mouth daily with breakfast.   Yes [provider]  furosemide (LASIX) 40 MG tablet Take 1 tablet (40 mg total) by mouth 2 (two) times daily. As needed for swelling 08/18/19  Yes Lorretta Harp, MD  glucose blood (ACCU-CHEK AVIVA PLUS) test strip Test blood sugar 3 times daily. Dx code: E11.22 04/03/16  Yes [name], MD  hydrALAZINE (  APRESOLINE) 50 MG tablet  09/27/19  Yes [provider]  insulin NPH-regular Human (NOVOLIN 70/30) (70-30) 100 UNIT/ML injection ADMINISTER 8 UNITS UNDER THE SKIN TWICE DAILY WITH A MEAL 04/22/19  Yes [name], MD  Insulin Syringe-Needle U-100 (B-D INS SYR HALF-UNIT .3CC/31G) 31G X 5/16" 0.3 ML MISC Use daily at bedtime to inject insulin. Dx code: 250.00 06/21/13  Yes [name], MD  Lancets (ACCU-CHEK MULTICLIX) lancets Test blood sugar 3 times daily. Dx code: E11.22 04/03/16  Yes [name], MD  lisinopril (ZESTRIL) 20 MG tablet TAKE 1 TABLET (20 MG TOTAL) BY MOUTH DAILY. 08/16/19  Yes [name], MD  metFORMIN (GLUCOPHAGE) 500 MG tablet  07/22/19  Yes [provider]  mirabegron ER (MYRBETRIQ) 25 MG TB24 tablet Take 25 mg by mouth daily.    Yes [provider]  Multiple Vitamin (MULTIVITAMIN) tablet Take 1 tablet by mouth daily. COMPLETE SENIOR VITAMINS AND M   Yes [provider]  Omega-3 Fatty Acids (FISH OIL) 1000 MG CAPS Take 2 capsules by mouth daily.    Yes [provider]  Vitamin D, Cholecalciferol, 1000 UNITS TABS Take 1 tablet by  mouth daily.    Yes [provider]   Social History   Socioeconomic History  . Marital status: Legally Separated    Spouse name: Not on file  . Number of children: 7  . Years of education: Not on file  . Highest education level: Not on file  Occupational History  . Occupation: RETIRED    Employer: RETIRED  Tobacco Use  . Smoking status: Former Smoker    Packs/day: 0.00    Years: 15.00    Pack years: 0.00    Types: Cigarettes    Quit date: 05/10/2017    Years since quitting: 2.4  . Smokeless tobacco: Never Used  . Tobacco comment: Patient smokes occasionally. Not everyday.  Vaping Use  . Vaping Use: Never used  Substance and Sexual Activity  . Alcohol use: No  . Drug use: No  . Sexual activity: Never  Other Topics Concern  . Not on file  Social History Narrative   29 grandchildren. Single. Education: 12 th grade. Exercise: 3-4 times a week for 30 minutes working out and set ups.   Social Determinants of Health   Financial Resource Strain:   . Difficulty of Paying Living Expenses:   Food Insecurity:   . Worried About Charity fundraiser in the Last Year:   . Arboriculturist in the Last Year:   Transportation Needs:   . Film/video editor (Medical):   Marland Kitchen Lack of Transportation (Non-Medical):   Physical Activity:   . Days of Exercise per Week:   . Minutes of Exercise per Session:   Stress:   . Feeling of Stress :   Social Connections:   . Frequency of Communication with Friends and Family:   . Frequency of Social Gatherings with Friends and Family:   . Attends Religious Services:   . Active Member of Clubs or Organizations:   . Attends Archivist Meetings:   Marland Kitchen Marital Status:   Intimate Partner Violence:   . Fear of Current or Ex-Partner:   . Emotionally Abused:   Marland Kitchen Physically Abused:   . Sexually Abused:     Review of Systems  Constitutional: Negative for fatigue and unexpected weight change.  Eyes: Negative for visual disturbance.    Respiratory: Negative for cough, chest tightness and shortness  of breath.   Cardiovascular: Negative for chest pain, palpitations and leg swelling.  Gastrointestinal: Negative for abdominal pain and blood in stool.  Neurological: Negative for dizziness, light-headedness and headaches.     Objective:   Vitals:   10/07/19 0943 10/07/19 0947  BP: (!) 145/77 136/70  Pulse: 73   Temp: 98.4 F (36.9 C)   TempSrc: Temporal   SpO2: 96%   Weight: 231 lb (104.8 kg)   Height: 6' (1.829 m)      Physical Exam Vitals reviewed.  Constitutional:      Appearance: He is well-developed.  HENT:     Head: Normocephalic and atraumatic.  Eyes:     Pupils: Pupils are equal, round, and reactive to light.  Neck:     Vascular: No carotid bruit or JVD.  Cardiovascular:     Rate and Rhythm: Normal rate and regular rhythm.     Heart sounds: Normal heart sounds. No murmur heard.   Pulmonary:     Effort: Pulmonary effort is normal.     Breath sounds: Normal breath sounds. No rales.  Skin:    General: Skin is warm and dry.  Neurological:     Mental Status: He is alert and oriented to person, place, and time.    Results for orders placed or performed in visit on 10/07/19  POCT glucose (manual entry)  Result Value Ref Range   POC Glucose 158 (A) 70 - 99 mg/dl       Assessment & Plan:  [name] is a 86 y.o. male . Type 2 diabetes mellitus with hyperglycemia, with long-term current use of insulin (HCC) - Plan: POCT glucose (manual entry), Fructosamine Type 2 diabetes mellitus with stage 3b chronic kidney disease, with long-term current use of insulin (HCC) - Plan: POCT glucose (manual entry), Fructosamine  -Discussed need for improved control, especially with history of CKD, CAD, microalbuminuria.  He has made significant diet changes, thought to be diet indiscretion component of previous decreased control.  Will check fructosamine, then decide increased dosing of insulin.  Advised to  call and schedule appointment for retinopathy screening.  Recheck 6 weeks.   Hyperlipidemia, unspecified hyperlipidemia type - Plan: atorvastatin (LIPITOR) 10 MG tablet, Lipid panel, Comprehensive metabolic panel  Stable, tolerating current regimen. Medications refilled. Labs pending as above.    Meds ordered this encounter  Medications  . atorvastatin (LIPITOR) 10 MG tablet    Sig: Take 1 tablet (10 mg total) by mouth daily.    Dispense:  90 tablet    Refill:  1   Patient Instructions   Call and schedule eye doctor appointment - make sure they send me a note.  I will check other diabetes test to decide on med changes. Continue to work on diet choices for diabetes. See info below to see if that helps. I am happy to refer you to nutrition if more assistance with food choices needed.    Diabetes Mellitus and Nutrition, Adult When you have diabetes (diabetes mellitus), it is very important to have healthy eating habits because your blood sugar (glucose) levels are greatly affected by what you eat and drink. Eating healthy foods in the appropriate amounts, at about the same times every day, can help you:  Control your blood glucose.  Lower your risk of heart disease.  Improve your blood pressure.  Reach or maintain a healthy weight. Every person with diabetes is different, and each person has different needs for a meal plan. Your health care provider  may recommend that you work with a diet and nutrition specialist (dietitian) to make a meal plan that is best for you. Your meal plan may vary depending on factors such as:  The calories you need.  The medicines you take.  Your weight.  Your blood glucose, blood pressure, and cholesterol levels.  Your activity level.  Other health conditions you have, such as heart or kidney disease. How do carbohydrates affect me? Carbohydrates, also called carbs, affect your blood glucose level more than any other type of food. Eating carbs  naturally raises the amount of glucose in your blood. Carb counting is a method for keeping track of how many carbs you eat. Counting carbs is important to keep your blood glucose at a healthy level, especially if you use insulin or take certain oral diabetes medicines. It is important to know how many carbs you can safely have in each meal. This is different for every person. Your dietitian can help you calculate how many carbs you should have at each meal and for each snack. Foods that contain carbs include:  Bread, cereal, rice, pasta, and crackers.  Potatoes and corn.  Peas, beans, and lentils.  Milk and yogurt.  Fruit and juice.  Desserts, such as cakes, cookies, ice cream, and candy. How does alcohol affect me? Alcohol can cause a sudden decrease in blood glucose (hypoglycemia), especially if you use insulin or take certain oral diabetes medicines. Hypoglycemia can be a life-threatening condition. Symptoms of hypoglycemia (sleepiness, dizziness, and confusion) are similar to symptoms of having too much alcohol. If your health care provider says that alcohol is safe for you, follow these guidelines:  Limit alcohol intake to no more than 1 drink per day for nonpregnant women and 2 drinks per day for men. One drink equals 12 oz of beer, 5 oz of wine, or 1 oz of hard liquor.  Do not drink on an empty stomach.  Keep yourself hydrated with water, diet soda, or unsweetened iced tea.  Keep in mind that regular soda, juice, and other mixers may contain a lot of sugar and must be counted as carbs. What are tips for following this plan?  Reading food labels  Start by checking the serving size on the "Nutrition Facts" label of packaged foods and drinks. The amount of calories, carbs, fats, and other nutrients listed on the label is based on one serving of the item. Many items contain more than one serving per package.  Check the total grams (g) of carbs in one serving. You can calculate  the number of servings of carbs in one serving by dividing the total carbs by 15. For example, if a food has 30 g of total carbs, it would be equal to 2 servings of carbs.  Check the number of grams (g) of saturated and trans fats in one serving. Choose foods that have low or no amount of these fats.  Check the number of milligrams (mg) of salt (sodium) in one serving. Most people should limit total sodium intake to less than 2,300 mg per day.  Always check the nutrition information of foods labeled as "low-fat" or "nonfat". These foods may be higher in added sugar or refined carbs and should be avoided.  Talk to your dietitian to identify your daily goals for nutrients listed on the label. Shopping  Avoid buying canned, premade, or processed foods. These foods tend to be high in fat, sodium, and added sugar.  Shop around the outside edge of the  grocery store. This includes fresh fruits and vegetables, bulk grains, fresh meats, and fresh dairy. Cooking  Use low-heat cooking methods, such as baking, instead of high-heat cooking methods like deep frying.  Cook using healthy oils, such as olive, canola, or sunflower oil.  Avoid cooking with butter, cream, or high-fat meats. Meal planning  Eat meals and snacks regularly, preferably at the same times every day. Avoid going long periods of time without eating.  Eat foods high in fiber, such as fresh fruits, vegetables, beans, and whole grains. Talk to your dietitian about how many servings of carbs you can eat at each meal.  Eat 4-6 ounces (oz) of lean protein each day, such as lean meat, chicken, fish, eggs, or tofu. One oz of lean protein is equal to: ? 1 oz of meat, chicken, or fish. ? 1 egg. ?  cup of tofu.  Eat some foods each day that contain healthy fats, such as avocado, nuts, seeds, and fish. Lifestyle  Check your blood glucose regularly.  Exercise regularly as told by your health care provider. This may include: ? 150  minutes of moderate-intensity or vigorous-intensity exercise each week. This could be brisk walking, biking, or water aerobics. ? Stretching and doing strength exercises, such as yoga or weightlifting, at least 2 times a week.  Take medicines as told by your health care provider.  Do not use any products that contain nicotine or tobacco, such as cigarettes and e-cigarettes. If you need help quitting, ask your health care provider.  Work with a Social worker or diabetes educator to identify strategies to manage stress and any emotional and social challenges. Questions to ask a health care provider  Do I need to meet with a diabetes educator?  Do I need to meet with a dietitian?  What number can I call if I have questions?  When are the best times to check my blood glucose? Where to find more information:  American Diabetes Association: diabetes.org  Academy of Nutrition and Dietetics: www.eatright.[employer] of Diabetes and Digestive and Kidney Diseases (NIH): [employer] Summary  A healthy meal plan will help you control your blood glucose and maintain a healthy lifestyle.  Working with a diet and nutrition specialist (dietitian) can help you make a meal plan that is best for you.  Keep in mind that carbohydrates (carbs) and alcohol have immediate effects on your blood glucose levels. It is important to count carbs and to use alcohol carefully. This information is not intended to replace advice given to you by your health care provider. Make sure you discuss any questions you have with your health care provider. Document Revised: 03/07/2017 Document Reviewed: 04/29/2016 Elsevier Patient Education  [employer].     If you have lab work done today you will be contacted with your lab results within the next 2 weeks.  If you have not heard from [place] then please contact us. The fastest way to get your results is to register for My Chart.   IF you received an  x-ray today, you will receive an invoice from [facility]. Please contact [facility] at [phone] with questions or concerns regarding your invoice.   IF you received labwork today, you will receive an invoice from [facility]. Please contact [facility] at [phone] with questions or concerns regarding your invoice.   Our billing staff will not be able to assist you with questions regarding bills from these companies.  You will be contacted with the lab results as soon  as they are available. The fastest way to get your results is to activate your My Chart account. Instructions are located on the last page of this paperwork. If you have not heard from [place] regarding the results in 2 weeks, please contact this office.         Signed, [name], MD [facility]

## 2019-10-08 LAB — FRUCTOSAMINE: Fructosamine: 327 umol/L — ABNORMAL HIGH (ref 0–285)

## 2019-10-20 ENCOUNTER — Ambulatory Visit: Payer: Medicare HMO | Admitting: Family Medicine

## 2019-10-25 ENCOUNTER — Telehealth: Payer: Self-pay | Admitting: *Deleted

## 2019-10-25 NOTE — Telephone Encounter (Signed)
Schedule AWV after 11-17-2019

## 2019-11-08 ENCOUNTER — Ambulatory Visit (INDEPENDENT_AMBULATORY_CARE_PROVIDER_SITE_OTHER): Payer: Medicare HMO | Admitting: Family Medicine

## 2019-11-08 ENCOUNTER — Encounter: Payer: Self-pay | Admitting: Family Medicine

## 2019-11-08 ENCOUNTER — Other Ambulatory Visit: Payer: Self-pay

## 2019-11-08 VITALS — BP 144/90 | HR 65 | Temp 98.0°F | Resp 16 | Ht 72.0 in | Wt 231.0 lb

## 2019-11-08 DIAGNOSIS — Z794 Long term (current) use of insulin: Secondary | ICD-10-CM | POA: Diagnosis not present

## 2019-11-08 DIAGNOSIS — N1832 Chronic kidney disease, stage 3b: Secondary | ICD-10-CM | POA: Diagnosis not present

## 2019-11-08 DIAGNOSIS — E1121 Type 2 diabetes mellitus with diabetic nephropathy: Secondary | ICD-10-CM

## 2019-11-08 LAB — GLUCOSE, POCT (MANUAL RESULT ENTRY): POC Glucose: 184 mg/dl — AB (ref 70–99)

## 2019-11-08 MED ORDER — FREESTYLE LIBRE SENSOR SYSTEM MISC: 1.0000 "application " | Freq: Every day | 0 refills | Status: DC

## 2019-11-08 NOTE — Patient Instructions (Addendum)
Check 2 hour after meal OR fasting blood sugars - ok to pick one every few days. I will also try to order the [employer] device to see if that is covered. Readings in office are higher than your home readings.   Lab only visit in 1 month for hemoglobin A1c. Continue the good work with watching diet/exercise.    Office visit with me in 4 months (unless we make some changes).      Type 2 Diabetes Mellitus, Self Care, Adult Caring for yourself after you have been diagnosed with type 2 diabetes (type 2 diabetes mellitus) means keeping your blood sugar (glucose) under control with a balance of:  Nutrition.  Exercise.  Lifestyle changes.  Medicines or insulin, if necessary.  Support from your team of health care providers and others. The following information explains what you need to know to manage your diabetes at home. What are the risks? Having diabetes can put you at risk for other long-term (chronic) conditions, such as heart disease and kidney disease. Your health care provider may prescribe medicines to help prevent complications from diabetes. These medicines may include:  Aspirin.  Medicine to lower cholesterol.  Medicine to control blood pressure. How to monitor blood glucose   Check your blood glucose every day, as often as told by your health care provider.  Have your A1c (hemoglobin A1c) level checked two or more times a year, or as often as told by your health care provider. Your health care provider will set individualized treatment goals for you. Generally, the goal of treatment is to maintain the following blood glucose levels:  Before meals (preprandial): 80-130 mg/dL (4.4-7.2 mmol/L).  After meals (postprandial): below 180 mg/dL (10 mmol/L).  A1c level: less than 7.5% How to manage hyperglycemia and hypoglycemia Hyperglycemia symptoms Hyperglycemia, also called high blood glucose, occurs when blood glucose is too high. Make sure you know the early signs of  hyperglycemia, such as:  Increased thirst.  Hunger.  Feeling very tired.  Needing to urinate more often than usual.  Blurry vision. Hypoglycemia symptoms Hypoglycemia, also called low blood glucose, occurswith a blood glucose level at or below 70 mg/dL (3.9 mmol/L). The risk for hypoglycemia increases during or after exercise, during sleep, during illness, and when skipping meals or not eating for a long time (fasting). It is important to know the symptoms of hypoglycemia and treat it right away. Always have a 15-gram rapid-acting carbohydrate snack with you to treat low blood glucose. Family members and close friends should also know the symptoms and should understand how to treat hypoglycemia, in case you are not able to treat yourself. Symptoms may include:  Hunger.  Anxiety.  Sweating and feeling clammy.  Confusion.  Dizziness or feeling light-headed.  Sleepiness.  Nausea.  Increased heart rate.  Headache.  Blurry vision.  Irritability.  A change in coordination.  Tingling or numbness around the mouth, lips, or tongue.  Restless sleep.  Fainting.  Seizure. Treating hypoglycemia If you are alert and able to swallow safely, follow the 15:15 rule:  Take 15 grams of a rapid-acting carbohydrate. Talk with your health care provider about how much you should take.  Rapid-acting options include: ? Glucose pills (take 15 grams). ? 6-8 pieces of hard candy. ? 4-6 oz (120-150 mL) of fruit juice. ? 4-6 oz (120-150 mL) of regular (not diet) soda. ? 1 Tbsp (15 mL) honey or sugar.  Check your blood glucose 15 minutes after you take the carbohydrate.  If the  repeat blood glucose level is still at or below 70 mg/dL (3.9 mmol/L), take 15 grams of a carbohydrate again.  If your blood glucose level does not increase above 70 mg/dL (3.9 mmol/L) after 3 tries, seek emergency medical care.  After your blood glucose level returns to normal, eat a meal or a snack within 1  hour. Treating severe hypoglycemia Severe hypoglycemia is when your blood glucose level is at or below 54 mg/dL (3 mmol/L). Severe hypoglycemia is an emergency. Do not wait to see if the symptoms will go away. Get medical help right away. Call your local emergency services (911 in the [place] If you have severe hypoglycemia and you cannot eat or drink, you may need an injection of glucagon. A family member or close friend should learn how to check your blood glucose and how to give you a glucagon injection. Ask your health care provider if you need to have an emergency glucagon injection kit available. Severe hypoglycemia may need to be treated in a hospital. The treatment may include getting glucose through an IV. You may also need treatment for the cause of your hypoglycemia. Follow these instructions at home: Take diabetes medicines as told  If your health care provider prescribed insulin or diabetes medicines, take them every day.  Do not run out of insulin or other diabetes medicines that you take. Plan ahead so you always have these available.  If you use insulin, adjust your dosage based on how physically active you are and what foods you eat. Your health care provider will tell you how to adjust your dosage. Make healthy food choices  The things that you eat and drink affect your blood glucose and your insulin dosage. Making good choices helps to control your diabetes and prevent other health problems. A healthy meal plan includes eating lean proteins, complex carbohydrates, fresh fruits and vegetables, low-fat dairy products, and healthy fats. Make an appointment to see a diet and nutrition specialist (registered dietitian) to help you create an eating plan that is right for you. Make sure that you:  Follow instructions from your health care provider about eating or drinking restrictions.  Drink enough fluid to keep your urine pale yellow.  Keep a record of the carbohydrates that you  eat. Do this by reading food labels and learning the standard serving sizes of foods.  Follow your sick day plan whenever you cannot eat or drink as usual. Make this plan in advance with your health care provider.  Stay active Exercise regularly, as told by your health care provider. This may include:  Stretching and doing strength exercises, such as yoga or weightlifting, 2 or more times a week.  Doing 150 minutes or more of moderate-intensity or vigorous-intensity exercise each week. This could be brisk walking, biking, or water aerobics. ? Spread out your activity over 3 or more days of the week. ? Do not go more than 2 days in a row without doing some kind of physical activity. When you start a new exercise or activity, work with your health care provider to adjust your insulin, medicines, or food intake as needed. Make healthy lifestyle choices  Do not use any tobacco products, such as cigarettes, chewing tobacco, and e-cigarettes. If you need help quitting, ask your health care provider.  If your health care provider says that alcohol is safe for you, limit alcohol intake to no more than 1 drink per day for nonpregnant women and 2 drinks per day for men. One drink  equals 12 oz of beer (355 mL), 5 oz of wine (148 mL), or 1 oz of hard liquor (44 mL).  Learn to manage stress. If you need help with this, ask your health care provider. Care for your body   Keep your immunizations up to date. In addition to getting vaccinations as told by your health care provider, it is recommended that you get vaccinated against the following illnesses: ? The flu (influenza). Get a flu shot every year. ? Pneumonia. ? Hepatitis B.  Schedule an eye exam soon after your diagnosis, and then one time every year after that.  Check your skin and feet every day for cuts, bruises, redness, blisters, or sores. Schedule a foot exam with your health care provider once every year.  Brush your teeth and gums two  times a day, and floss one or more times a day. Visit your dentist one or more times every 6 months.  Maintain a healthy weight. General instructions  Take over-the-counter and prescription medicines only as told by your health care provider.  Share your diabetes management plan with people in your workplace, school, and household.  Carry a medical alert card or wear medical alert jewelry.  Keep all follow-up visits as told by your health care provider. This is important. Questions to ask your health care provider  Do I need to meet with a diabetes educator?  Where can I find a support group for people with diabetes? Where to find more information For more information about diabetes, visit:  American Diabetes Association (ADA): www.diabetes.org  American Association of Diabetes Educators (AADE): www.diabeteseducator.org Summary  Caring for yourself after you have been diagnosed with (type 2 diabetes mellitus) means keeping your blood sugar (glucose) under control with a balance of nutrition, exercise, lifestyle changes, and medicine.  Check your blood glucose every day, as often as told by your health care provider.  Having diabetes can put you at risk for other long-term (chronic) conditions, such as heart disease and kidney disease. Your health care provider may prescribe medicines to help prevent complications from diabetes.  Keep all follow-up visits as told by your health care provider. This is important. This information is not intended to replace advice given to you by your health care provider. Make sure you discuss any questions you have with your health care provider. Document Revised: 09/15/2017 Document Reviewed: 04/28/2015 Elsevier Patient Education  [employer].    If you have lab work done today you will be contacted with your lab results within the next 2 weeks.  If you have not heard from [place] then please contact us. The fastest way to get your results is  to register for My Chart.   IF you received an x-ray today, you will receive an invoice from [facility]. Please contact [facility] at [phone] with questions or concerns regarding your invoice.   IF you received labwork today, you will receive an invoice from [place]. Please contact [facility] at [phone] with questions or concerns regarding your invoice.   Our billing staff will not be able to assist you with questions regarding bills from these companies.  You will be contacted with the lab results as soon as they are available. The fastest way to get your results is to activate your My Chart account. Instructions are located on the last page of this paperwork. If you have not heard from [place] regarding the results in 2 weeks, please contact this office.

## 2019-11-08 NOTE — Progress Notes (Signed)
Subjective:  Patient ID: [name], male    DOB: Oct 08, 1933  Age: 86 y.o. MRN: [id number]  CC:  Chief Complaint  Patient presents with  . Diabetes    follow up     HPI Kunaal L Zeek presents for   Diabetes: Complicated by nephropathy, microalbuminuria, CAD, hyperglycemia.  Nephrology Dr. Royce Macadamia, cardiology Dr. [name].  CKD stage IIIb, Metformin has been discontinued before due to his CKD. He was taking 8 units of 7030 twice per day at last visit.  Some dietary indiscretion noted at that time.  Did report improved diet adherence since June.  Home readings below 130s reported at last visit.  Weight was decreasing with walking for exercise and diet changes. In office reading of 158 on July 1.  Still on 8 units 70/30 BID.  Walking 1/60mle 4 days per week. Has been watching diet Home readings:  Fasting lowest 95, 101. No symptomatic lows. No postprandial readings.   Wt Readings from Last 3 Encounters:  11/08/19 (!) 231 lb (104.8 kg)  10/07/19 231 lb (104.8 kg)  09/07/19 232 lb 4.8 oz (105.4 kg)  has bloodwork with nephrology in 1 week, cardiology follow up soon as well.    Lab Results  Component Value Date   HGBA1C 9.3 (H) 09/07/2019   HGBA1C 8.6 (H) 04/22/2019   HGBA1C 7.4 (H) 01/18/2019   Lab Results  Component Value Date   MICROALBUR 83.3 05/17/2015   LDLCALC 83 10/07/2019   CREATININE 1.98 (H) 10/07/2019     History Patient Active Problem List   Diagnosis Date Noted  . CHF (congestive heart failure) ([place] 09/07/2019  . Chronic renal insufficiency, stage 3 (moderate) 09/07/2019  . Lower extremity edema 08/18/2019  . Osteoarthritis of right knee 03/04/2018  . Rib pain on left side 07/01/2016  . Fall 12/14/2015  . Elevated serum creatinine 12/14/2015  . Ileus ([facility] 12/14/2015  . Multiple fractures of ribs of left side 12/12/2015  . Prostate cancer ([place] 05/26/2014  . Blood in the urine 12/01/2013  . Cystitis, radiation 12/01/2013  . Hematuria  12/01/2013  . 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 Male erectile dysfunction 11/26/2013  . Pulsatile tinnitus 11/19/2013  . Rectal bleeding 12/23/2012  . Insulin dependent type 2 diabetes mellitus, controlled ([facility]   . Essential hypertension   . Chronic back pain   . ED (erectile dysfunction)   . Anemia   . Dyslipidemia, goal LDL below 70   . CAD S/P percutaneous coronary angioplasty 05/08/2008  . History of ventricular tachycardia 05/08/2008  . PREMATURE VENTRICULAR CONTRACTIONS 05/08/2008   Past Medical History:  Diagnosis Date  . Allergy   . Anemia   . CAD (coronary artery disease)    pci to LAD 10/09; stable CAD by cath 05/31/08; myoview 04/11/11- no ishcemia, EF 67%; echo 05/15/09- EF>55%, mod calcification of the aortic valve leaflets  . CAD S/P percutaneous coronary angioplasty 05/08/2008   LAD PCI with DES 2009- Myoview low risk 2013  . CHF (congestive heart failure) ([facility] 09/07/2019  . Chronic back pain   . Cystitis, radiation 12/01/2013  . ED (erectile dysfunction)   . Elevated serum creatinine 12/14/2015  . Essential hypertension, benign   . Hematuria 12/01/2013  . Hyperlipidemia   . Ileus ([place] 12/14/2015  . Insulin dependent type 2 diabetes mellitus, controlled (H[place]   . Malignant neoplasm of prostate ([facility] 11/26/2013  . Multiple rib fractures    left 9th, 10th, and 11th posterior rib fractures S/P fall 12/09/2015/notes 12/12/2015  .  Osteoarthritis of right knee 03/04/2018  . PREMATURE VENTRICULAR CONTRACTIONS 05/08/2008   Qualifier: Diagnosis of  By: Rayann Heman, MD, Jeneen Rinks    . Prostate cancer ([place])    s/p  . Rectal bleeding 12/23/2012   Evaluated by Dr Benson [place] [facility] patient with history of radiation proctitis. Patient due for colonoscopy on 04/2014   . Type II or unspecified type diabetes mellitus without mention of complication, not stated as uncontrolled   . Ventricular tachycardia ([place])    resuscitated; monitor 05/2008; attempted T ablation 2/10- aborted due to inappropriate substrate    Past Surgical History:  Procedure Laterality Date  . CARDIAC CATHETERIZATION  05/31/08   EF 55-60%, stable lesions, medical therapy  . COLECTOMY  '80's   polyps  . CORONARY ANGIOPLASTY WITH STENT PLACEMENT  01/19/08   PCI stent to mid LAD with Promus DES 27.5x28  . IR GENERIC HISTORICAL  01/03/2016   IR THORACENTESIS ASP PLEURAL SPACE W/IMG GUIDE 01/03/2016 [facility]INTER[facility] RAD  . KNEE ARTHROPLASTY Right 03/04/2018   Procedure: COMPUTER ASSISTED TOTAL KNEE ARTHROPLASTY;  Surgeon: Rod Can, MD;  Location: [facility];  Service: Orthopedics;  Laterality: Right;  . PTCA  01/2008   Allergies  Allergen Reactions  . Contrast Media [Iodinated Diagnostic Agents] Swelling and Other (See Comments)    Possible neck swelling after contrast for chest CT 03/2016. No hives, throat or other symptoms   Prior to Admission medications   Medication Sig Start Date End Date Taking? Authorizing Provider  amLODipine (NORVASC) 10 MG tablet Take 1 tablet (10 mg total) by mouth daily. 04/22/19  Yes [name], MD  atorvastatin (LIPITOR) 10 MG tablet Take 1 tablet (10 mg total) by mouth daily. 10/07/19  Yes [name], MD  carvedilol (COREG) 25 MG tablet Take 1 tablet (25 mg total) by mouth 2 (two) times daily. 10/04/19 01/02/20 Yes Kilroy, Luke K, PA-C  docusate sodium (COLACE) 100 MG capsule Colace 100 mg capsule   100 mg by oral route. 03/04/18  Yes [provider]  ferrous sulfate 325 (65 FE) MG tablet Take 325 mg by mouth daily with breakfast.   Yes [provider]  furosemide (LASIX) 40 MG tablet Take 1 tablet (40 mg total) by mouth 2 (two) times daily. As needed for swelling 08/18/19  Yes Lorretta Harp, MD  hydrALAZINE (APRESOLINE) 50 MG tablet  09/27/19  Yes [provider]  insulin NPH-regular Human (NOVOLIN 70/30) (70-30) 100 UNIT/ML injection ADMINISTER 8 UNITS UNDER THE SKIN TWICE DAILY WITH A MEAL 04/22/19  Yes [name], MD  lisinopril (ZESTRIL) 20 MG tablet  TAKE 1 TABLET (20 MG TOTAL) BY MOUTH DAILY. 08/16/19  Yes [name], MD  mirabegron ER (MYRBETRIQ) 25 MG TB24 tablet Take 25 mg by mouth daily.    Yes [provider]  Multiple Vitamin (MULTIVITAMIN) tablet Take 1 tablet by mouth daily. COMPLETE SENIOR VITAMINS AND M   Yes [provider]  Omega-3 Fatty Acids (FISH OIL) 1000 MG CAPS Take 2 capsules by mouth daily.    Yes [provider]  Vitamin D, Cholecalciferol, 1000 UNITS TABS Take 1 tablet by mouth daily.    Yes [provider]  Blood Glucose Monitoring Suppl (ACCU-CHEK AVIVA PLUS) w/Device KIT Test blood sugar 3 times daily. Dx code: E11.22 04/03/16   [name], MD  glucose blood (ACCU-CHEK AVIVA PLUS) test strip Test blood sugar 3 times daily. Dx code: E11.22 04/03/16   [name], MD  Insulin Syringe-Needle U-100 (  B-D INS SYR HALF-UNIT .3CC/31G) 31G X 5/16" 0.3 ML MISC Use daily at bedtime to inject insulin. Dx code: 250.00 06/21/13   [name], MD  Lancets (ACCU-CHEK MULTICLIX) lancets Test blood sugar 3 times daily. Dx code: E11.22 04/03/16   [name], MD   Social History   Socioeconomic History  . Marital status: Legally Separated    Spouse name: Not on file  . Number of children: 7  . Years of education: Not on file  . Highest education level: Not on file  Occupational History  . Occupation: RETIRED    Employer: RETIRED  Tobacco Use  . Smoking status: Former Smoker    Packs/day: 0.00    Years: 15.00    Pack years: 0.00    Types: Cigarettes    Quit date: 05/10/2017    Years since quitting: 2.4  . Smokeless tobacco: Never Used  . Tobacco comment: Patient smokes occasionally. Not everyday.  Vaping Use  . Vaping Use: Never used  Substance and Sexual Activity  . Alcohol use: No  . Drug use: No  . Sexual activity: Never  Other Topics Concern  . Not on file  Social History Narrative   29 grandchildren. Single. Education: 12 th grade. Exercise: 3-4  times a week for 30 minutes working out and set ups.   Social Determinants of Health   Financial Resource Strain:   . Difficulty of Paying Living Expenses:   Food Insecurity:   . Worried About Charity fundraiser in the Last Year:   . Arboriculturist in the Last Year:   Transportation Needs:   . Film/video editor (Medical):   Marland Kitchen Lack of Transportation (Non-Medical):   Physical Activity:   . Days of Exercise per Week:   . Minutes of Exercise per Session:   Stress:   . Feeling of Stress :   Social Connections:   . Frequency of Communication with Friends and Family:   . Frequency of Social Gatherings with Friends and Family:   . Attends Religious Services:   . Active Member of Clubs or Organizations:   . Attends Archivist Meetings:   Marland Kitchen Marital Status:   Intimate Partner Violence:   . Fear of Current or Ex-Partner:   . Emotionally Abused:   Marland Kitchen Physically Abused:   . Sexually Abused:     Review of Systems  Constitutional: Negative for fatigue and unexpected weight change.  Eyes: Negative for visual disturbance.  Respiratory: Negative for cough, chest tightness and shortness of breath.   Cardiovascular: Negative for chest pain, palpitations and leg swelling (improving. ).  Gastrointestinal: Negative for abdominal pain and blood in stool.  Neurological: Negative for dizziness, light-headedness and headaches.     Objective:   Vitals:   11/08/19 1002 11/08/19 1052  BP: (!) 166/74 (!) 144/90  Pulse: 65   Resp: 16   Temp: 98 F (36.7 C)   TempSrc: Temporal   SpO2: 98%   Weight: (!) 231 lb (104.8 kg)   Height: 6' (1.829 m)      Physical Exam Vitals reviewed.  Constitutional:      Appearance: He is well-developed.  HENT:     Head: Normocephalic and atraumatic.  Eyes:     Pupils: Pupils are equal, round, and reactive to light.  Neck:     Vascular: No carotid bruit or JVD.  Cardiovascular:     Rate and Rhythm: Normal rate and regular rhythm.  Heart sounds: Normal heart sounds. No murmur heard.   Pulmonary:     Effort: Pulmonary effort is normal.     Breath sounds: Normal breath sounds. No rales.  Skin:    General: Skin is warm and dry.  Neurological:     Mental Status: He is alert and oriented to person, place, and time.  Psychiatric:        Mood and Affect: Mood normal.        Behavior: Behavior normal.      Assessment & Plan:  JAIDON ELLERY is a 86 y.o. male . Type 2 diabetes mellitus with stage 3b chronic kidney disease, with long-term current use of insulin ([place]) - Plan: Continuous Blood Gluc Sensor ([place]) MISC, POCT glucose (manual entry), Hemoglobin A1c  - commended on diet changes, walking. Home readings ok fasting, some discrepancy vs. In office.  Will try to get freestyle libre ordered. No changes for now. Goal A1c under 7.5 with his age.  Hypoglycemic precautions.   Meds ordered this encounter  Medications  . Continuous Blood Gluc Sensor (FREESTYLE LIBRE SENSOR SYSTEM) MISC    Sig: 1 application by Does not apply route daily.    Dispense:  1 each    Refill:  0    Disp 1 system with qs of devices for 3 moths, 3 refills.  Dx: E11.21, N18.32, Z79.4   Patient Instructions    Check 2 hour after meal OR fasting blood sugars - ok to pick one every few days. I will also try to order the [place] device to see if that is covered. Readings in office are higher than your home readings.   Lab only visit in 1 month for hemoglobin A1c. Continue the good work with watching diet/exercise.    Office visit with me in 4 months (unless we make some changes).      Type 2 Diabetes Mellitus, Self Care, Adult Caring for yourself after you have been diagnosed with type 2 diabetes (type 2 diabetes mellitus) means keeping your blood sugar (glucose) under control with a balance of:  Nutrition.  Exercise.  Lifestyle changes.  Medicines or insulin, if necessary.  Support from your team of health  care providers and others. The following information explains what you need to know to manage your diabetes at home. What are the risks? Having diabetes can put you at risk for other long-term (chronic) conditions, such as heart disease and kidney disease. Your health care provider may prescribe medicines to help prevent complications from diabetes. These medicines may include:  Aspirin.  Medicine to lower cholesterol.  Medicine to control blood pressure. How to monitor blood glucose   Check your blood glucose every day, as often as told by your health care provider.  Have your A1c (hemoglobin A1c) level checked two or more times a year, or as often as told by your health care provider. Your health care provider will set individualized treatment goals for you. Generally, the goal of treatment is to maintain the following blood glucose levels:  Before meals (preprandial): 80-130 mg/dL (4.4-7.2 mmol/L).  After meals (postprandial): below 180 mg/dL (10 mmol/L).  A1c level: less than 7.5% How to manage hyperglycemia and hypoglycemia Hyperglycemia symptoms Hyperglycemia, also called high blood glucose, occurs when blood glucose is too high. Make sure you know the early signs of hyperglycemia, such as:  Increased thirst.  Hunger.  Feeling very tired.  Needing to urinate more often than usual.  Blurry vision. Hypoglycemia symptoms Hypoglycemia,  also called low blood glucose, occurswith a blood glucose level at or below 70 mg/dL (3.9 mmol/L). The risk for hypoglycemia increases during or after exercise, during sleep, during illness, and when skipping meals or not eating for a long time (fasting). It is important to know the symptoms of hypoglycemia and treat it right away. Always have a 15-gram rapid-acting carbohydrate snack with you to treat low blood glucose. Family members and close friends should also know the symptoms and should understand how to treat hypoglycemia, in case you  are not able to treat yourself. Symptoms may include:  Hunger.  Anxiety.  Sweating and feeling clammy.  Confusion.  Dizziness or feeling light-headed.  Sleepiness.  Nausea.  Increased heart rate.  Headache.  Blurry vision.  Irritability.  A change in coordination.  Tingling or numbness around the mouth, lips, or tongue.  Restless sleep.  Fainting.  Seizure. Treating hypoglycemia If you are alert and able to swallow safely, follow the 15:15 rule:  Take 15 grams of a rapid-acting carbohydrate. Talk with your health care provider about how much you should take.  Rapid-acting options include: ? Glucose pills (take 15 grams). ? 6-8 pieces of hard candy. ? 4-6 oz (120-150 mL) of fruit juice. ? 4-6 oz (120-150 mL) of regular (not diet) soda. ? 1 Tbsp (15 mL) honey or sugar.  Check your blood glucose 15 minutes after you take the carbohydrate.  If the repeat blood glucose level is still at or below 70 mg/dL (3.9 mmol/L), take 15 grams of a carbohydrate again.  If your blood glucose level does not increase above 70 mg/dL (3.9 mmol/L) after 3 tries, seek emergency medical care.  After your blood glucose level returns to normal, eat a meal or a snack within 1 hour. Treating severe hypoglycemia Severe hypoglycemia is when your blood glucose level is at or below 54 mg/dL (3 mmol/L). Severe hypoglycemia is an emergency. Do not wait to see if the symptoms will go away. Get medical help right away. Call your local emergency services (911 in the [place] If you have severe hypoglycemia and you cannot eat or drink, you may need an injection of glucagon. A family member or close friend should learn how to check your blood glucose and how to give you a glucagon injection. Ask your health care provider if you need to have an emergency glucagon injection kit available. Severe hypoglycemia may need to be treated in a hospital. The treatment may include getting glucose through an IV. You  may also need treatment for the cause of your hypoglycemia. Follow these instructions at home: Take diabetes medicines as told  If your health care provider prescribed insulin or diabetes medicines, take them every day.  Do not run out of insulin or other diabetes medicines that you take. Plan ahead so you always have these available.  If you use insulin, adjust your dosage based on how physically active you are and what foods you eat. Your health care provider will tell you how to adjust your dosage. Make healthy food choices  The things that you eat and drink affect your blood glucose and your insulin dosage. Making good choices helps to control your diabetes and prevent other health problems. A healthy meal plan includes eating lean proteins, complex carbohydrates, fresh fruits and vegetables, low-fat dairy products, and healthy fats. Make an appointment to see a diet and nutrition specialist (registered dietitian) to help you create an eating plan that is right for you. Make sure that you:  Follow instructions from your health care provider about eating or drinking restrictions.  Drink enough fluid to keep your urine pale yellow.  Keep a record of the carbohydrates that you eat. Do this by reading food labels and learning the standard serving sizes of foods.  Follow your sick day plan whenever you cannot eat or drink as usual. Make this plan in advance with your health care provider.  Stay active Exercise regularly, as told by your health care provider. This may include:  Stretching and doing strength exercises, such as yoga or weightlifting, 2 or more times a week.  Doing 150 minutes or more of moderate-intensity or vigorous-intensity exercise each week. This could be brisk walking, biking, or water aerobics. ? Spread out your activity over 3 or more days of the week. ? Do not go more than 2 days in a row without doing some kind of physical activity. When you start a new exercise  or activity, work with your health care provider to adjust your insulin, medicines, or food intake as needed. Make healthy lifestyle choices  Do not use any tobacco products, such as cigarettes, chewing tobacco, and e-cigarettes. If you need help quitting, ask your health care provider.  If your health care provider says that alcohol is safe for you, limit alcohol intake to no more than 1 drink per day for nonpregnant women and 2 drinks per day for men. One drink equals 12 oz of beer (355 mL), 5 oz of wine (148 mL), or 1 oz of hard liquor (44 mL).  Learn to manage stress. If you need help with this, ask your health care provider. Care for your body   Keep your immunizations up to date. In addition to getting vaccinations as told by your health care provider, it is recommended that you get vaccinated against the following illnesses: ? The flu (influenza). Get a flu shot every year. ? Pneumonia. ? Hepatitis B.  Schedule an eye exam soon after your diagnosis, and then one time every year after that.  Check your skin and feet every day for cuts, bruises, redness, blisters, or sores. Schedule a foot exam with your health care provider once every year.  Brush your teeth and gums two times a day, and floss one or more times a day. Visit your dentist one or more times every 6 months.  Maintain a healthy weight. General instructions  Take over-the-counter and prescription medicines only as told by your health care provider.  Share your diabetes management plan with people in your workplace, school, and household.  Carry a medical alert card or wear medical alert jewelry.  Keep all follow-up visits as told by your health care provider. This is important. Questions to ask your health care provider  Do I need to meet with a diabetes educator?  Where can I find a support group for people with diabetes? Where to find more information For more information about diabetes, visit:  American  Diabetes Association (ADA): www.diabetes.org  American Association of Diabetes Educators (AADE): www.diabeteseducator.org Summary  Caring for yourself after you have been diagnosed with (type 2 diabetes mellitus) means keeping your blood sugar (glucose) under control with a balance of nutrition, exercise, lifestyle changes, and medicine.  Check your blood glucose every day, as often as told by your health care provider.  Having diabetes can put you at risk for other long-term (chronic) conditions, such as heart disease and kidney disease. Your health care provider may prescribe medicines to help prevent complications from  diabetes.  Keep all follow-up visits as told by your health care provider. This is important. This information is not intended to replace advice given to you by your health care provider. Make sure you discuss any questions you have with your health care provider. Document Revised: 09/15/2017 Document Reviewed: 04/28/2015 Elsevier Patient Education  [employer].    If you have lab work done today you will be contacted with your lab results within the next 2 weeks.  If you have not heard from [place] then please contact us. The fastest way to get your results is to register for My Chart.   IF you received an x-ray today, you will receive an invoice from [facility]. Please contact [facility] at [phone] with questions or concerns regarding your invoice.   IF you received labwork today, you will receive an invoice from [facility]. Please contact [facility] at [phone] with questions or concerns regarding your invoice.   Our billing staff will not be able to assist you with questions regarding bills from these companies.  You will be contacted with the lab results as soon as they are available. The fastest way to get your results is to activate your My Chart account. Instructions are located on the last page of this paperwork. If you have not heard  from [place] regarding the results in 2 weeks, please contact this office.         Signed, [name], MD [facility]

## 2019-11-10 ENCOUNTER — Telehealth: Payer: Self-pay | Admitting: Family Medicine

## 2019-11-10 ENCOUNTER — Other Ambulatory Visit: Payer: Self-pay

## 2019-11-10 DIAGNOSIS — E1122 Type 2 diabetes mellitus with diabetic chronic kidney disease: Secondary | ICD-10-CM

## 2019-11-10 DIAGNOSIS — N1832 Chronic kidney disease, stage 3b: Secondary | ICD-10-CM

## 2019-11-10 NOTE — Telephone Encounter (Signed)
Pt stopped by please send authorization to [facility] for free style libre and  Reader . Need a prescription because it cost close to 100.00 for reader  Needs to be called into [employer] so that its aff ofordable   Please call pts daughter Elmyra Ricks with any questions at [phone]

## 2019-11-10 NOTE — Telephone Encounter (Signed)
Pt needs authorization through [facility] for [place] freestyle monitor order has been sent to them 11/08/2019 pt called today stating needed auth if you were not aware.

## 2019-11-15 DIAGNOSIS — N1832 Chronic kidney disease, stage 3b: Secondary | ICD-10-CM | POA: Diagnosis not present

## 2019-11-18 ENCOUNTER — Other Ambulatory Visit: Payer: Self-pay | Admitting: Family Medicine

## 2019-11-23 ENCOUNTER — Encounter: Payer: Self-pay | Admitting: Cardiovascular Disease

## 2019-11-23 ENCOUNTER — Other Ambulatory Visit: Payer: Self-pay

## 2019-11-23 ENCOUNTER — Ambulatory Visit: Payer: Medicare HMO | Admitting: Cardiovascular Disease

## 2019-11-23 DIAGNOSIS — E872 Acidosis: Secondary | ICD-10-CM | POA: Diagnosis not present

## 2019-11-23 DIAGNOSIS — R0600 Dyspnea, unspecified: Secondary | ICD-10-CM | POA: Diagnosis not present

## 2019-11-23 DIAGNOSIS — I129 Hypertensive chronic kidney disease with stage 1 through stage 4 chronic kidney disease, or unspecified chronic kidney disease: Secondary | ICD-10-CM | POA: Diagnosis not present

## 2019-11-23 DIAGNOSIS — I472 Ventricular tachycardia: Secondary | ICD-10-CM | POA: Diagnosis not present

## 2019-11-23 DIAGNOSIS — D631 Anemia in chronic kidney disease: Secondary | ICD-10-CM | POA: Diagnosis not present

## 2019-11-23 DIAGNOSIS — N1832 Chronic kidney disease, stage 3b: Secondary | ICD-10-CM | POA: Diagnosis not present

## 2019-11-23 DIAGNOSIS — R600 Localized edema: Secondary | ICD-10-CM | POA: Diagnosis not present

## 2019-11-23 DIAGNOSIS — N179 Acute kidney failure, unspecified: Secondary | ICD-10-CM | POA: Diagnosis not present

## 2019-11-23 DIAGNOSIS — E785 Hyperlipidemia, unspecified: Secondary | ICD-10-CM | POA: Diagnosis not present

## 2019-11-23 DIAGNOSIS — I10 Essential (primary) hypertension: Secondary | ICD-10-CM | POA: Diagnosis not present

## 2019-11-23 DIAGNOSIS — Z9861 Coronary angioplasty status: Secondary | ICD-10-CM

## 2019-11-23 DIAGNOSIS — I4729 Other ventricular tachycardia: Secondary | ICD-10-CM

## 2019-11-23 DIAGNOSIS — E1122 Type 2 diabetes mellitus with diabetic chronic kidney disease: Secondary | ICD-10-CM | POA: Diagnosis not present

## 2019-11-23 DIAGNOSIS — I2510 Atherosclerotic heart disease of native coronary artery without angina pectoris: Secondary | ICD-10-CM | POA: Diagnosis not present

## 2019-11-23 DIAGNOSIS — N2581 Secondary hyperparathyroidism of renal origin: Secondary | ICD-10-CM | POA: Diagnosis not present

## 2019-11-23 DIAGNOSIS — R0609 Other forms of dyspnea: Secondary | ICD-10-CM

## 2019-11-23 DIAGNOSIS — R809 Proteinuria, unspecified: Secondary | ICD-10-CM | POA: Diagnosis not present

## 2019-11-23 MED ORDER — CARVEDILOL 25 MG PO TABS: 25.0000 mg | ORAL_TABLET | Freq: Two times a day (BID) | ORAL | 3 refills | Status: DC

## 2019-11-23 MED ORDER — LISINOPRIL 20 MG PO TABS: 20.0000 mg | ORAL_TABLET | Freq: Every day | ORAL | 1 refills | Status: DC

## 2019-11-23 MED ORDER — AMLODIPINE BESYLATE 10 MG PO TABS: 10.0000 mg | ORAL_TABLET | Freq: Every day | ORAL | 1 refills | Status: DC

## 2019-11-23 MED ORDER — FUROSEMIDE 40 MG PO TABS: 40.0000 mg | ORAL_TABLET | Freq: Two times a day (BID) | ORAL | 3 refills | Status: DC

## 2019-11-23 MED ORDER — ATORVASTATIN CALCIUM 10 MG PO TABS: 10.0000 mg | ORAL_TABLET | Freq: Every day | ORAL | 1 refills | Status: DC

## 2019-11-23 NOTE — Assessment & Plan Note (Signed)
History of CAD status post LAD stenting with a Promus drug-eluting stent October 2009.  He remains asymptomatic from this.

## 2019-11-23 NOTE — Assessment & Plan Note (Signed)
History of dyslipidemia on statin therapy with lipid profile performed 10/07/2019 revealing total cholesterol 152, LDL 57 HDL 43.

## 2019-11-23 NOTE — Assessment & Plan Note (Signed)
Bilateral lower extremity edema improved twice daily furosemide with stable renal function.

## 2019-11-23 NOTE — Progress Notes (Signed)
11/23/2019 [name]   [birth year]  [id number]  Primary Physician [name], MD Primary Cardiologist: Lorretta Harp MD Lupe Carney, [place]  HPI:  [name] is a 86 y.o.  moderately overweight, recently remarried for the third time (09/30/14)African American male, father of 3 children and 31 stepchildren, grandfather to 58 grandchildren, who is formerly a patient of Dr. Francine Graven. I last saw him  08/18/2019. He has a history of CAD status post LAD stenting with a Promus drug-eluting stent, October of 2009. He had attempt at VT ablation by Dr. Thompson Grayer, February 2010; however, this was aborted because of inappropriate substrate. His other problems include hypertension, hyperlipidemia, diabetes, as well as history of prostate cancer. Since I saw him, he has been asymptomatic. His last Myoview performed 04/11/11 was completely normal. He did stop his aspirin because of rectal bleeding is noticed increased evening flushing with Niaspan. His most recent lipid profile performed11/11/2017 revealed total cholesterol 137, LDL 62 and HDL 39.  Since I saw him  3 months ago he has clinically improved with the doubling of his diuretic dose.  His edema has all but resolved his breathing is improved as well and his renal function has remained stable.  His last 2D echocardiogram performed 09/09/2019 revealed normal LV systolic function with moderate LVH and grade 1 diastolic dysfunction.   Current Meds  Medication Sig  . amLODipine (NORVASC) 10 MG tablet Take 1 tablet (10 mg total) by mouth daily.  Marland Kitchen atorvastatin (LIPITOR) 10 MG tablet Take 1 tablet (10 mg total) by mouth daily.  . Blood Glucose Monitoring Suppl (ACCU-CHEK AVIVA PLUS) w/Device KIT Test blood sugar 3 times daily. Dx code: E11.22  . carvedilol (COREG) 25 MG tablet Take 1 tablet (25 mg total) by mouth 2 (two) times daily.  . Continuous Blood Gluc Sensor (FREESTYLE LIBRE SENSOR SYSTEM) MISC 1 application by Does not  apply route daily.  Marland Kitchen docusate sodium (COLACE) 100 MG capsule Colace 100 mg capsule   100 mg by oral route.  . ferrous sulfate 325 (65 FE) MG tablet Take 325 mg by mouth daily with breakfast.  . furosemide (LASIX) 40 MG tablet Take 1 tablet (40 mg total) by mouth 2 (two) times daily. As needed for swelling  . glucose blood (ACCU-CHEK AVIVA PLUS) test strip Test blood sugar 3 times daily. Dx code: E11.22  . hydrALAZINE (APRESOLINE) 50 MG tablet   . insulin NPH-regular Human (NOVOLIN 70/30) (70-30) 100 UNIT/ML injection ADMINISTER 8 UNITS UNDER THE SKIN TWICE DAILY WITH A MEAL  . Insulin Syringe-Needle U-100 (B-D INS SYR HALF-UNIT .3CC/31G) 31G X 5/16" 0.3 ML MISC Use daily at bedtime to inject insulin. Dx code: 250.00  . Lancets (ACCU-CHEK MULTICLIX) lancets Test blood sugar 3 times daily. Dx code: E11.22  . lisinopril (ZESTRIL) 20 MG tablet Take 1 tablet (20 mg total) by mouth daily.  . mirabegron ER (MYRBETRIQ) 25 MG TB24 tablet Take 25 mg by mouth daily.   . Multiple Vitamin (MULTIVITAMIN) tablet Take 1 tablet by mouth daily. COMPLETE SENIOR VITAMINS AND M  . Omega-3 Fatty Acids (FISH OIL) 1000 MG CAPS Take 2 capsules by mouth daily.   . Vitamin D, Cholecalciferol, 1000 UNITS TABS Take 1 tablet by mouth daily.   . [DISCONTINUED] amLODipine (NORVASC) 10 MG tablet Take 1 tablet (10 mg total) by mouth daily.  . [DISCONTINUED] atorvastatin (LIPITOR) 10 MG tablet Take 1 tablet (10 mg total) by mouth daily.  . [DISCONTINUED] carvedilol (  COREG) 25 MG tablet Take 1 tablet (25 mg total) by mouth 2 (two) times daily.  . [DISCONTINUED] furosemide (LASIX) 40 MG tablet Take 1 tablet (40 mg total) by mouth 2 (two) times daily. As needed for swelling  . [DISCONTINUED] lisinopril (ZESTRIL) 20 MG tablet TAKE 1 TABLET (20 MG TOTAL) BY MOUTH DAILY.     No Known Allergies  Social History   Socioeconomic History  . Marital status: Legally Separated    Spouse name: Not on file  . Number of children: 7  .  Years of education: Not on file  . Highest education level: Not on file  Occupational History  . Occupation: RETIRED    Employer: RETIRED  Tobacco Use  . Smoking status: Former Smoker    Packs/day: 0.00    Years: 15.00    Pack years: 0.00    Types: Cigarettes    Quit date: 05/10/2017    Years since quitting: 2.5  . Smokeless tobacco: Never Used  . Tobacco comment: Patient smokes occasionally. Not everyday.  Vaping Use  . Vaping Use: Never used  Substance and Sexual Activity  . Alcohol use: No  . Drug use: No  . Sexual activity: Never  Other Topics Concern  . Not on file  Social History Narrative   29 grandchildren. Single. Education: 12 th grade. Exercise: 3-4 times a week for 30 minutes working out and set ups.   Social Determinants of Health   Financial Resource Strain:   . Difficulty of Paying Living Expenses:   Food Insecurity:   . Worried About Charity fundraiser in the Last Year:   . Arboriculturist in the Last Year:   Transportation Needs:   . Film/video editor (Medical):   Marland Kitchen Lack of Transportation (Non-Medical):   Physical Activity:   . Days of Exercise per Week:   . Minutes of Exercise per Session:   Stress:   . Feeling of Stress :   Social Connections:   . Frequency of Communication with Friends and Family:   . Frequency of Social Gatherings with Friends and Family:   . Attends Religious Services:   . Active Member of Clubs or Organizations:   . Attends Archivist Meetings:   Marland Kitchen Marital Status:   Intimate Partner Violence:   . Fear of Current or Ex-Partner:   . Emotionally Abused:   Marland Kitchen Physically Abused:   . Sexually Abused:      Review of Systems: General: negative for chills, fever, night sweats or weight changes.  Cardiovascular: negative for chest pain, dyspnea on exertion, edema, orthopnea, palpitations, paroxysmal nocturnal dyspnea or shortness of breath Dermatological: negative for rash Respiratory: negative for cough or  wheezing Urologic: negative for hematuria Abdominal: negative for nausea, vomiting, diarrhea, bright red blood per rectum, melena, or hematemesis Neurologic: negative for visual changes, syncope, or dizziness All other systems reviewed and are otherwise negative except as noted above.    Blood pressure (!) 146/76, pulse 69, height 6' (1.829 m), weight 235 lb (106.6 kg), SpO2 98 %.  General appearance: alert and no distress Neck: no adenopathy, no carotid bruit, no JVD, supple, symmetrical, trachea midline and thyroid not enlarged, symmetric, no tenderness/mass/nodules Lungs: clear to auscultation bilaterally Heart: regular rate and rhythm, S1, S2 normal, no murmur, click, rub or gallop Extremities: 1+ lower extremity edema Pulses: 2+ and symmetric Skin: Skin color, texture, turgor normal. No rashes or lesions Neurologic: Alert and oriented X 3, normal strength and tone. Normal  symmetric reflexes. Normal coordination and gait  EKG not performed today  ASSESSMENT AND PLAN:   CAD S/P percutaneous coronary angioplasty History of CAD status post LAD stenting with a Promus drug-eluting stent October 2009.  He remains asymptomatic from this.  Essential hypertension History of essential hypertension blood pressure measured today 146/76.  He is on carvedilol, amlodipine and lisinopril.  Dyslipidemia, goal LDL below 70 History of dyslipidemia on statin therapy with lipid profile performed 10/07/2019 revealing total cholesterol 152, LDL 57 HDL 43.  Lower extremity edema Bilateral lower extremity edema improved twice daily furosemide with stable renal function.      Lorretta Harp MD FACP,FACC,FAHA, [facility] 11/23/2019 2:41 PM

## 2019-11-23 NOTE — Assessment & Plan Note (Signed)
History of essential hypertension blood pressure measured today 146/76.  He is on carvedilol, amlodipine and lisinopril.

## 2019-11-23 NOTE — Patient Instructions (Signed)
Medication Instructions:  The current medical regimen is effective;  continue present plan and medications.  *If you need a refill on your cardiac medications before your next appointment, please call your pharmacy*   Follow-Up: At [facility], you and your health needs are our priority.  As part of our continuing mission to provide you with exceptional heart care, we have created designated Provider Care Teams.  These Care Teams include your primary Cardiologist (physician) and Advanced Practice Providers (APPs -  Physician Assistants and Nurse Practitioners) who all work together to provide you with the care you need, when you need it.  We recommend signing up for the patient portal called "MyChart".  Sign up information is provided on this After Visit Summary.  MyChart is used to connect with patients for Virtual Visits (Telemedicine).  Patients are able to view lab/test results, encounter notes, upcoming appointments, etc.  Non-urgent messages can be sent to your provider as well.   To learn more about what you can do with MyChart, go to [email or web address].    Your next appointment:   6 month follow up with Kerin Ransom, PA 12 month follow up with Dr.Berry

## 2019-11-25 DIAGNOSIS — H5203 Hypermetropia, bilateral: Secondary | ICD-10-CM | POA: Diagnosis not present

## 2019-11-25 DIAGNOSIS — H52209 Unspecified astigmatism, unspecified eye: Secondary | ICD-10-CM | POA: Diagnosis not present

## 2019-11-25 DIAGNOSIS — H524 Presbyopia: Secondary | ICD-10-CM | POA: Diagnosis not present

## 2019-12-09 ENCOUNTER — Other Ambulatory Visit: Payer: Self-pay

## 2019-12-09 ENCOUNTER — Telehealth: Payer: Self-pay

## 2019-12-09 ENCOUNTER — Encounter: Payer: Self-pay | Admitting: Family Medicine

## 2019-12-09 ENCOUNTER — Ambulatory Visit (INDEPENDENT_AMBULATORY_CARE_PROVIDER_SITE_OTHER): Payer: Medicare HMO | Admitting: Family Medicine

## 2019-12-09 DIAGNOSIS — E1121 Type 2 diabetes mellitus with diabetic nephropathy: Secondary | ICD-10-CM | POA: Diagnosis not present

## 2019-12-09 DIAGNOSIS — Z794 Long term (current) use of insulin: Secondary | ICD-10-CM

## 2019-12-09 DIAGNOSIS — N1832 Chronic kidney disease, stage 3b: Secondary | ICD-10-CM

## 2019-12-09 DIAGNOSIS — E1122 Type 2 diabetes mellitus with diabetic chronic kidney disease: Secondary | ICD-10-CM

## 2019-12-09 NOTE — Progress Notes (Signed)
Lab only visit.  Patient not seen.

## 2019-12-09 NOTE — Telephone Encounter (Signed)
Called pt to answer question he had about his Metoprolol medication. Looking back in pt's chart I found a note on 10/07/2019 were pt reported the medication was changed from Metoprolol to Carvedilol. I informed the pt of the Carvedilol's last fill date, the amount, and the way the medication was prescribed. This information was given to the pt and the pt's wife at the pt's request.

## 2019-12-10 LAB — HEMOGLOBIN A1C
Est. average glucose Bld gHb Est-mCnc: 243 mg/dL
Hgb A1c MFr Bld: 10.1 % — ABNORMAL HIGH (ref 4.8–5.6)

## 2019-12-16 ENCOUNTER — Ambulatory Visit (INDEPENDENT_AMBULATORY_CARE_PROVIDER_SITE_OTHER): Payer: Medicare HMO | Admitting: Family Medicine

## 2019-12-16 VITALS — BP 146/76 | Ht 72.0 in | Wt 235.0 lb

## 2019-12-16 DIAGNOSIS — Z0000 Encounter for general adult medical examination without abnormal findings: Secondary | ICD-10-CM

## 2019-12-16 NOTE — Progress Notes (Signed)
Presents today for [employer] Visit   Date of last exam: 10-07-2019  Interpreter used for this visit? No  I connected with  [name] on 12/16/19 by a telephone and verified that I am speaking with the correct person using two identifiers.   I discussed the limitations of evaluation and management by telemedicine. The patient expressed understanding and agreed to proceed.  Patient location: home  Provider location: in office  I provided 20 minutes of non face - to - face time during this encounter.  Patient Care Team: [name], MD as PCP - General (Family Medicine) Lorretta Harp, MD as PCP - Cardiology (Cardiology) Thompson Grayer, MD as PCP - Electrophysiology (Cardiology) Lorretta Harp, MD as Consulting Physician (Cardiology)   Other items to address today:  Discussed Immunizations Will come in to get FLU-SHOT Discussed Eye/Dental Follow up scheduled Dr. Carlota Raspberry 12/3 @ 8:40 am    Other Screening: Last screening for diabetes: 12/09/2019 Last lipid screening: 10/07/2019  ADVANCE DIRECTIVES: Discussed: {yes On File: yes Materials Provided: no  Immunization status:  Immunization History  Administered Date(s) Administered  . Fluad Quad(high Dose 65+) 12/02/2018  . Influenza, High Dose Seasonal PF 01/12/2018  . Influenza,inj,Quad PF,6+ Mos 01/04/2013, 12/27/2013, 01/02/2015, 12/30/2015, 02/10/2017  . Influenza,inj,quad, With Preservative 01/06/2017  . PFIZER SARS-COV-2 Vaccination 04/29/2019, 05/20/2019  . Pneumococcal Conjugate-13 12/27/2013  . Pneumococcal Polysaccharide-23 01/04/2013  . Td 04/08/2009  . Zoster 04/08/2009     Health Maintenance Due  Topic Date Due  . FOOT EXAM  10/19/2019  . INFLUENZA VACCINE  11/07/2019     Functional Status Survey: Is the patient deaf or have difficulty hearing?: No Does the patient have difficulty seeing, even when wearing glasses/contacts?: No Does the patient have difficulty  concentrating, remembering, or making decisions?: No Does the patient have difficulty walking or climbing stairs?: No Does the patient have difficulty dressing or bathing?: No Does the patient have difficulty doing errands alone such as visiting a doctor's office or shopping?: No   6CIT Screen 12/16/2019 11/17/2018 12/19/2016  What Year? 0 points 0 points 0 points  What month? 0 points 0 points 0 points  What time? 0 points 0 points 0 points  Count back from 20 0 points 0 points 0 points  Months in reverse 0 points 0 points 4 points  Repeat phrase 0 points 0 points 10 points  Total Score 0 0 14        Clinical Support from 12/16/2019 in [facility] at [place]  AUDIT-C Score 0       Home Environment:  Lives in a one story with wife No scattered rugs Yes Grab bars Adequate lighting/ no clutter Has a little trouble with arthritis but can get up and down   Patient Active Problem List   Diagnosis Date Noted  . CHF (congestive heart failure) ([place]) 09/07/2019  . Chronic renal insufficiency, stage 3 (moderate) 09/07/2019  . Lower extremity edema 08/18/2019  . Osteoarthritis of right knee 03/04/2018  . Rib pain on left side 07/01/2016  . Fall 12/14/2015  . Elevated serum creatinine 12/14/2015  . Ileus ([place]) 12/14/2015  . Multiple fractures of ribs of left side 12/12/2015  . Prostate cancer ([place]) 05/26/2014  . Blood in the urine 12/01/2013  . Cystitis, radiation 12/01/2013  . Hematuria 12/01/2013  . 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 Male erectile dysfunction 11/26/2013  . Pulsatile tinnitus 11/19/2013  . Rectal bleeding 12/23/2012  . Insulin dependent type  2 diabetes mellitus, controlled ([place])   . Essential hypertension   . Chronic back pain   . ED (erectile dysfunction)   . Anemia   . Dyslipidemia, goal LDL below 70   . CAD S/P percutaneous coronary angioplasty 05/08/2008  . History of ventricular tachycardia 05/08/2008  . PREMATURE VENTRICULAR CONTRACTIONS 05/08/2008      Past Medical History:  Diagnosis Date  . Allergy   . Anemia   . CAD (coronary artery disease)    pci to LAD 10/09; stable CAD by cath 05/31/08; myoview 04/11/11- no ishcemia, EF 67%; echo 05/15/09- EF>55%, mod calcification of the aortic valve leaflets  . CAD S/P percutaneous coronary angioplasty 05/08/2008   LAD PCI with DES 2009- Myoview low risk 2013  . CHF (congestive heart failure) ([place]) 09/07/2019  . Chronic back pain   . Cystitis, radiation 12/01/2013  . ED (erectile dysfunction)   . Elevated serum creatinine 12/14/2015  . Essential hypertension, benign   . Hematuria 12/01/2013  . Hyperlipidemia   . Ileus ([place]) 12/14/2015  . Insulin dependent type 2 diabetes mellitus, controlled ([place])   . Malignant neoplasm of prostate ([place]) 11/26/2013  . Multiple rib fractures    left 9th, 10th, and 11th posterior rib fractures S/P fall 12/09/2015/notes 12/12/2015  . Osteoarthritis of right knee 03/04/2018  . PREMATURE VENTRICULAR CONTRACTIONS 05/08/2008   Qualifier: Diagnosis of  By: Rayann Heman, MD, Jeneen Rinks    . Prostate cancer ([place])    s/p  . Rectal bleeding 12/23/2012   Evaluated by Dr Benson [place] GI patient with history of radiation proctitis. Patient due for colonoscopy on 04/2014   . Type II or unspecified type diabetes mellitus without mention of complication, not stated as uncontrolled   . Ventricular tachycardia ([place])    resuscitated; monitor 05/2008; attempted T ablation 2/10- aborted due to inappropriate substrate     Past Surgical History:  Procedure Laterality Date  . CARDIAC CATHETERIZATION  05/31/08   EF 55-60%, stable lesions, medical therapy  . COLECTOMY  '80's   polyps  . CORONARY ANGIOPLASTY WITH STENT PLACEMENT  01/19/08   PCI stent to mid LAD with Promus DES 27.5x28  . IR GENERIC HISTORICAL  01/03/2016   IR THORACENTESIS ASP PLEURAL SPACE W/IMG GUIDE 01/03/2016 [facility]-INTERV RAD  . KNEE ARTHROPLASTY Right 03/04/2018   Procedure: COMPUTER ASSISTED TOTAL KNEE ARTHROPLASTY;  Surgeon: Rod Can, MD;  Location: [facility] ORS;  Service: Orthopedics;  Laterality: Right;  . PTCA  01/2008     Family History  Problem Relation Age of Onset  . Diabetes Mother   . Heart attack Mother   . Leukemia Father   . Heart disease Sister   . Heart disease Brother   . Diabetes Brother        pancreatic cancer     Social History   Socioeconomic History  . Marital status: Legally Separated    Spouse name: Not on file  . Number of children: 7  . Years of education: Not on file  . Highest education level: Not on file  Occupational History  . Occupation: RETIRED    Employer: RETIRED  Tobacco Use  . Smoking status: Former Smoker    Packs/day: 0.00    Years: 15.00    Pack years: 0.00    Types: Cigarettes    Quit date: 05/10/2017    Years since quitting: 2.6  . Smokeless tobacco: Never Used  . Tobacco comment: Patient smokes occasionally. Not everyday.  Vaping Use  . Vaping Use: Never used  Substance and Sexual Activity  . Alcohol use: No  . Drug use: No  . Sexual activity: Never  Other Topics Concern  . Not on file  Social History Narrative  . Not on file   Social Determinants of Health   Financial Resource Strain:   . Difficulty of Paying Living Expenses: Not on file  Food Insecurity:   . Worried About Charity fundraiser in the Last Year: Not on file  . Ran Out of Food in the Last Year: Not on file  Transportation Needs:   . Lack of Transportation (Medical): Not on file  . Lack of Transportation (Non-Medical): Not on file  Physical Activity:   . Days of Exercise per Week: Not on file  . Minutes of Exercise per Session: Not on file  Stress:   . Feeling of Stress : Not on file  Social Connections:   . Frequency of Communication with Friends and Family: Not on file  . Frequency of Social Gatherings with Friends and Family: Not on file  . Attends Religious Services: Not on file  . Active Member of Clubs or Organizations: Not on file  . Attends Archivist  Meetings: Not on file  . Marital Status: Not on file  Intimate Partner Violence:   . Fear of Current or Ex-Partner: Not on file  . Emotionally Abused: Not on file  . Physically Abused: Not on file  . Sexually Abused: Not on file     No Known Allergies   Prior to Admission medications   Medication Sig Start Date End Date Taking? Authorizing Provider  amLODipine (NORVASC) 10 MG tablet Take 1 tablet (10 mg total) by mouth daily. 11/23/19  Yes Lorretta Harp, MD  atorvastatin (LIPITOR) 10 MG tablet Take 1 tablet (10 mg total) by mouth daily. 11/23/19  Yes Lorretta Harp, MD  Blood Glucose Monitoring Suppl (ACCU-CHEK AVIVA PLUS) w/Device KIT Test blood sugar 3 times daily. Dx code: E11.22 04/03/16  Yes [name], MD  carvedilol (COREG) 25 MG tablet Take 1 tablet (25 mg total) by mouth 2 (two) times daily. 11/23/19 02/21/20 Yes Lorretta Harp, MD  Continuous Blood Gluc Sensor ([place]) MISC 1 application by Does not apply route daily. 11/08/19  Yes [name], MD  docusate sodium (COLACE) 100 MG capsule Colace 100 mg capsule   100 mg by oral route. 03/04/18  Yes [provider]  ferrous sulfate 325 (65 FE) MG tablet Take 325 mg by mouth daily with breakfast.   Yes [provider]  furosemide (LASIX) 40 MG tablet Take 1 tablet (40 mg total) by mouth 2 (two) times daily. As needed for swelling 11/23/19  Yes Lorretta Harp, MD  glucose blood (ACCU-CHEK AVIVA PLUS) test strip Test blood sugar 3 times daily. Dx code: E11.22 04/03/16  Yes [name], MD  hydrALAZINE (APRESOLINE) 50 MG tablet  09/27/19  Yes [provider]  insulin NPH-regular Human (NOVOLIN 70/30) (70-30) 100 UNIT/ML injection ADMINISTER 8 UNITS UNDER THE SKIN TWICE DAILY WITH A MEAL 04/22/19  Yes [name], MD  Insulin Syringe-Needle U-100 (B-D INS SYR HALF-UNIT .3CC/31G) 31G X 5/16" 0.3 ML MISC Use daily at bedtime to inject insulin. Dx code: 250.00  06/21/13  Yes [name], MD  Lancets (ACCU-CHEK MULTICLIX) lancets Test blood sugar 3 times daily. Dx code: E11.22 04/03/16  Yes [name], MD  lisinopril (ZESTRIL) 20 MG tablet Take 1 tablet (20 mg  total) by mouth daily. 11/23/19  Yes Lorretta Harp, MD  mirabegron ER (MYRBETRIQ) 25 MG TB24 tablet Take 25 mg by mouth daily.    Yes [provider]  Multiple Vitamin (MULTIVITAMIN) tablet Take 1 tablet by mouth daily. COMPLETE SENIOR VITAMINS AND M   Yes [provider]  Omega-3 Fatty Acids (FISH OIL) 1000 MG CAPS Take 2 capsules by mouth daily.    Yes [provider]  Vitamin D, Cholecalciferol, 1000 UNITS TABS Take 1 tablet by mouth daily.    Yes [provider]     Depression screen [facility] 2/9 12/16/2019 10/07/2019 08/06/2019 08/04/2019 07/12/2019  Decreased Interest 0 0 0 0 0  Down, Depressed, Hopeless 0 0 0 0 0  PHQ - 2 Score 0 0 0 0 0  Some recent data might be hidden     Fall Risk  12/16/2019 11/08/2019 10/07/2019 08/06/2019 08/04/2019  Falls in the past year? 0 0 0 0 0  Number falls in past yr: 0 - - - -  Comment - - - - -  Injury with Fall? 0 - - - -  Comment - - - - -  Risk Factor Category  - - - - -  Risk for fall due to : - - - - -  Follow up Falls evaluation completed;Education provided Falls evaluation completed Falls evaluation completed Falls evaluation completed Falls evaluation completed      PHYSICAL EXAM: BP (!) 146/76 Comment: taken from a previous visit/not in clinic  Ht 6' (1.829 m)   Wt 235 lb (106.6 kg)   BMI 31.87 kg/m    Wt Readings from Last 3 Encounters:  12/16/19 235 lb (106.6 kg)  11/23/19 235 lb (106.6 kg)  11/08/19 (!) 231 lb (104.8 kg)       Education/Counseling provided regarding diet and exercise, prevention of chronic diseases, smoking/tobacco cessation, if applicable, and reviewed "Covered Medicare Preventive Services."

## 2019-12-16 NOTE — Patient Instructions (Signed)
Thank you for taking time to come for your Medicare Wellness Visit. I appreciate your ongoing commitment to your health goals. Please review the following plan we discussed and let me know if I can assist you in the future.  Leroy Kennedy LPN  Preventive Care 84 Years and Older, Male Preventive care refers to lifestyle choices and visits with your health care provider that can promote health and wellness. This includes:  A yearly physical exam. This is also called an annual well check.  Regular dental and eye exams.  Immunizations.  Screening for certain conditions.  Healthy lifestyle choices, such as diet and exercise. What can I expect for my preventive care visit? Physical exam Your health care provider will check:  Height and weight. These may be used to calculate body mass index (BMI), which is a measurement that tells if you are at a healthy weight.  Heart rate and blood pressure.  Your skin for abnormal spots. Counseling Your health care provider may ask you questions about:  Alcohol, tobacco, and drug use.  Emotional well-being.  Home and relationship well-being.  Sexual activity.  Eating habits.  History of falls.  Memory and ability to understand (cognition).  Work and work Statistician. What immunizations do I need?  Influenza (flu) vaccine  This is recommended every year. Tetanus, diphtheria, and pertussis (Tdap) vaccine  You may need a Td booster every 84 years. Varicella (chickenpox) vaccine  You may need this vaccine if you have not already been vaccinated. Zoster (shingles) vaccine  You may need this after age 84. Pneumococcal conjugate (PCV13) vaccine  One dose is recommended after age 84. Pneumococcal polysaccharide (PPSV23) vaccine  One dose is recommended after age 88. Measles, mumps, and rubella (MMR) vaccine  You may need at least one dose of MMR if you were born in 1957 or later. You may also need a second dose. Meningococcal  conjugate (MenACWY) vaccine  You may need this if you have certain conditions. Hepatitis A vaccine  You may need this if you have certain conditions or if you travel or work in places where you may be exposed to hepatitis A. Hepatitis B vaccine  You may need this if you have certain conditions or if you travel or work in places where you may be exposed to hepatitis B. Haemophilus influenzae type b (Hib) vaccine  You may need this if you have certain conditions. You may receive vaccines as individual doses or as more than one vaccine together in one shot (combination vaccines). Talk with your health care provider about the risks and benefits of combination vaccines. What tests do I need? Blood tests  Lipid and cholesterol levels. These may be checked every 5 years, or more frequently depending on your overall health.  Hepatitis C test.  Hepatitis B test. Screening  Lung cancer screening. You may have this screening every year starting at age 84 if you have a 30-pack-year history of smoking and currently smoke or have quit within the past 15 years.  Colorectal cancer screening. All adults should have this screening starting at age 84 and continuing until age 84. Your health care provider may recommend screening at age 84 if you are at increased risk. You will have tests every 1-10 years, depending on your results and the type of screening test.  Prostate cancer screening. Recommendations will vary depending on your family history and other risks.  Diabetes screening. This is done by checking your blood sugar (glucose) after you have not eaten for  a while (fasting). You may have this done every 1-3 years.  Abdominal aortic aneurysm (AAA) screening. You may need this if you are a current or former smoker.  Sexually transmitted disease (STD) testing. Follow these instructions at home: Eating and drinking  Eat a diet that includes fresh fruits and vegetables, whole grains, lean  protein, and low-fat dairy products. Limit your intake of foods with high amounts of sugar, saturated fats, and salt.  Take vitamin and mineral supplements as recommended by your health care provider.  Do not drink alcohol if your health care provider tells you not to drink.  If you drink alcohol: ? Limit how much you have to 0-2 drinks a day. ? Be aware of how much alcohol is in your drink. In the [place] one drink equals one 12 oz bottle of beer (355 mL), one 5 oz glass of wine (148 mL), or one 1 oz glass of hard liquor (44 mL). Lifestyle  Take daily care of your teeth and gums.  Stay active. Exercise for at least 30 minutes on 5 or more days each week.  Do not use any products that contain nicotine or tobacco, such as cigarettes, e-cigarettes, and chewing tobacco. If you need help quitting, ask your health care provider.  If you are sexually active, practice safe sex. Use a condom or other form of protection to prevent STIs (sexually transmitted infections).  Talk with your health care provider about taking a low-dose aspirin or statin. What's next?  Visit your health care provider once a year for a well check visit.  Ask your health care provider how often you should have your eyes and teeth checked.  Stay up to date on all vaccines. This information is not intended to replace advice given to you by your health care provider. Make sure you discuss any questions you have with your health care provider. Document Revised: 03/19/2018 Document Reviewed: 03/19/2018 Elsevier Patient Education  2020 [employer] Inc[employer]  

## 2019-12-24 ENCOUNTER — Other Ambulatory Visit: Payer: Self-pay

## 2019-12-24 ENCOUNTER — Ambulatory Visit (INDEPENDENT_AMBULATORY_CARE_PROVIDER_SITE_OTHER): Payer: Medicare HMO

## 2019-12-24 DIAGNOSIS — Z23 Encounter for immunization: Secondary | ICD-10-CM | POA: Diagnosis not present

## 2020-01-10 ENCOUNTER — Other Ambulatory Visit: Payer: Self-pay | Admitting: Family Medicine

## 2020-01-10 DIAGNOSIS — I10 Essential (primary) hypertension: Secondary | ICD-10-CM

## 2020-03-06 DIAGNOSIS — D485 Neoplasm of uncertain behavior of skin: Secondary | ICD-10-CM | POA: Diagnosis not present

## 2020-03-06 DIAGNOSIS — B079 Viral wart, unspecified: Secondary | ICD-10-CM | POA: Diagnosis not present

## 2020-03-06 DIAGNOSIS — L821 Other seborrheic keratosis: Secondary | ICD-10-CM | POA: Diagnosis not present

## 2020-03-06 DIAGNOSIS — L820 Inflamed seborrheic keratosis: Secondary | ICD-10-CM | POA: Diagnosis not present

## 2020-03-10 ENCOUNTER — Ambulatory Visit (INDEPENDENT_AMBULATORY_CARE_PROVIDER_SITE_OTHER): Payer: Medicare HMO | Admitting: Family Medicine

## 2020-03-10 ENCOUNTER — Encounter: Payer: Self-pay | Admitting: Family Medicine

## 2020-03-10 ENCOUNTER — Other Ambulatory Visit: Payer: Self-pay

## 2020-03-10 VITALS — BP 150/76 | HR 74 | Temp 98.7°F | Ht 72.0 in | Wt 233.0 lb

## 2020-03-10 DIAGNOSIS — E1122 Type 2 diabetes mellitus with diabetic chronic kidney disease: Secondary | ICD-10-CM

## 2020-03-10 DIAGNOSIS — I5030 Unspecified diastolic (congestive) heart failure: Secondary | ICD-10-CM | POA: Diagnosis not present

## 2020-03-10 DIAGNOSIS — I10 Essential (primary) hypertension: Secondary | ICD-10-CM | POA: Diagnosis not present

## 2020-03-10 DIAGNOSIS — E1165 Type 2 diabetes mellitus with hyperglycemia: Secondary | ICD-10-CM | POA: Diagnosis not present

## 2020-03-10 DIAGNOSIS — N1832 Chronic kidney disease, stage 3b: Secondary | ICD-10-CM

## 2020-03-10 DIAGNOSIS — R809 Proteinuria, unspecified: Secondary | ICD-10-CM | POA: Diagnosis not present

## 2020-03-10 DIAGNOSIS — E1129 Type 2 diabetes mellitus with other diabetic kidney complication: Secondary | ICD-10-CM | POA: Diagnosis not present

## 2020-03-10 DIAGNOSIS — Z794 Long term (current) use of insulin: Secondary | ICD-10-CM | POA: Diagnosis not present

## 2020-03-10 DIAGNOSIS — I130 Hypertensive heart and chronic kidney disease with heart failure and stage 1 through stage 4 chronic kidney disease, or unspecified chronic kidney disease: Secondary | ICD-10-CM

## 2020-03-10 LAB — GLUCOSE, POCT (MANUAL RESULT ENTRY): POC Glucose: 142 mg/dl — AB (ref 70–99)

## 2020-03-10 LAB — POCT GLYCOSYLATED HEMOGLOBIN (HGB A1C): Hemoglobin A1C: 9.6 % — AB (ref 4.0–5.6)

## 2020-03-10 MED ORDER — NOVOLIN 70/30 (70-30) 100 UNIT/ML ~~LOC~~ SUSP: 10.0000 [IU] | Freq: Two times a day (BID) | SUBCUTANEOUS | 5 refills | Status: DC

## 2020-03-10 NOTE — Patient Instructions (Addendum)
Increase your insulin to 10 units twice per day for now.  Watch diet, watch for any low blood sugar symptoms, see information below.  If those occur let me know right away.  Recheck in 6 weeks.  Blood pressure running slightly high here today.  Check your readings at home and let me know or Dr. Luis Abed office know about those readings over the next 1 week.  May need to adjust your medications.  Return to the clinic or go to the nearest emergency room if any of your symptoms worsen or new symptoms occur.    Type 2 Diabetes Mellitus, Self Care, Adult When you have type 2 diabetes (type 2 diabetes mellitus), you must make sure your blood sugar (glucose) stays in a healthy range. You can do this with:  Nutrition.  Exercise.  Lifestyle changes.  Medicines or insulin, if needed.  Support from your doctors and others. How to stay aware of blood sugar   Check your blood sugar level every day, as often as told.  Have your A1c (hemoglobin A1c) level checked two or more times a year. Have it checked more often if your doctor tells you to. Your doctor will set personal treatment goals for you. Generally, you should have these blood sugar levels:  Before meals (preprandial): 80-130 mg/dL (4.4-7.2 mmol/L).  After meals (postprandial): below 180 mg/dL (10 mmol/L).  A1c level: less than 7%. How to manage high and low blood sugar Signs of high blood sugar High blood sugar is called hyperglycemia. Know the signs of high blood sugar. Signs may include:  Feeling: ? Thirsty. ? Hungry. ? Very tired.  Needing to pee (urinate) more than usual.  Blurry vision. Signs of low blood sugar Low blood sugar is called hypoglycemia. This is when blood sugar is at or below 70 mg/dL (3.9 mmol/L). Signs may include:  Feeling: ? Hungry. ? Worried or nervous (anxious). ? Sweaty and clammy. ? Confused. ? Dizzy. ? Sleepy. ? Sick to your stomach (nauseous).  Having: ? A fast heartbeat. ? A  headache. ? A change in your vision. ? Jerky movements that you cannot control (seizure). ? Tingling or no feeling (numbness) around your mouth, lips, or tongue.  Having trouble with: ? Moving (coordination). ? Sleeping. ? Passing out (fainting). ? Getting upset easily (irritability). Treating low blood sugar To treat low blood sugar, eat or drink something sugary right away. If you can think clearly and swallow safely, follow the 15:15 rule:  Take 15 grams of a fast-acting carb (carbohydrate). Talk with your doctor about how much you should take.  Some fast-acting carbs are: ? Sugar tablets (glucose pills). Take 3-4 pills. ? 6-8 pieces of hard candy. ? 4-6 oz (120-150 mL) of fruit juice. ? 4-6 oz (120-150 mL) of regular (not diet) soda. ? 1 Tbsp (15 mL) honey or sugar.  Check your blood sugar 15 minutes after you take the carb.  If your blood sugar is still at or below 70 mg/dL (3.9 mmol/L), take 15 grams of a carb again.  If your blood sugar does not go above 70 mg/dL (3.9 mmol/L) after 3 tries, get help right away.  After your blood sugar goes back to normal, eat a meal or a snack within 1 hour. Treating very low blood sugar If your blood sugar is at or below 54 mg/dL (3 mmol/L), you have very low blood sugar (severe hypoglycemia). This is an emergency. Do not wait to see if the symptoms will go away. Get  medical help right away. Call your local emergency services (911 in the [place] If you have very low blood sugar and you cannot eat or drink, you may need a glucagon shot (injection). A family member or friend should learn how to check your blood sugar and how to give you a glucagon shot. Ask your doctor if you need to have a glucagon shot kit at home. Follow these instructions at home: Medicine  Take insulin and diabetes medicines as told.  If your doctor says you should take more or less insulin and medicines, do this exactly as told.  Do not run out of insulin or  medicines. Having diabetes can raise your risk for other long-term conditions. These include heart disease and kidney disease. Your doctor may prescribe medicines to help you not have these problems. Food   Make healthy food choices. These include: ? Chicken, fish, egg whites, and beans. ? Oats, whole wheat, bulgur, brown rice, quinoa, and millet. ? Fresh fruits and vegetables. ? Low-fat dairy products. ? Nuts, avocado, olive oil, and canola oil.  Meet with a food specialist (dietitian). He or she can help you make an eating plan that is right for you.  Follow instructions from your doctor about what you cannot eat or drink.  Drink enough fluid to keep your pee (urine) pale yellow.  Keep track of carbs that you eat. Do this by reading food labels and learning food serving sizes.  Follow your sick day plan when you cannot eat or drink normally. Make this plan with your doctor so it is ready to use. Activity  Exercise 3 or more times a week.  Do not go more than 2 days without exercising.  Talk with your doctor before you start a new exercise. Your doctor may need to tell you to change: ? How much insulin or medicines you take. ? How much food you eat. Lifestyle  Do not use any tobacco products. These include cigarettes, chewing tobacco, and e-cigarettes. If you need help quitting, ask your doctor.  Ask your doctor how much alcohol is safe for you.  Learn to deal with stress. If you need help with this, ask your doctor. Body care   Stay up to date with your shots (immunizations).  Have your eyes and feet checked by a doctor as often as told.  Check your skin and feet every day. Check for cuts, bruises, redness, blisters, or sores.  Brush your teeth and gums two times a day. Floss one or more times a day.  Go to the dentist one or more times every 6 months.  Stay at a healthy weight. General instructions  Take over-the-counter and prescription medicines only as told  by your doctor.  Share your diabetes care plan with: ? Your work or school. ? People you live with.  Carry a card or wear jewelry that says you have diabetes.  Keep all follow-up visits as told by your doctor. This is important. Questions to ask your doctor  Do I need to meet with a diabetes educator?  Where can I find a support group for people with diabetes? Where to find more information To learn more about diabetes, visit:  American Diabetes Association: www.diabetes.org  American Association of Diabetes Educators: www.diabeteseducator.org Summary  When you have type 2 diabetes, you must make sure your blood sugar (glucose) stays in a healthy range.  Check your blood sugar every day, as often as told.  Having diabetes can raise your risk for other  conditions. Your doctor may prescribe medicines to help you not have these problems.  Keep all follow-up visits as told by your doctor. This is important. This information is not intended to replace advice given to you by your health care provider. Make sure you discuss any questions you have with your health care provider. Document Revised: 09/15/2017 Document Reviewed: 04/28/2015 Elsevier Patient Education  [employer].  Hypoglycemia Hypoglycemia is when the sugar (glucose) level in your blood is too low. Signs of low blood sugar may include:  Feeling: ? Hungry. ? Worried or nervous (anxious). ? Sweaty and clammy. ? Confused. ? Dizzy. ? Sleepy. ? Sick to your stomach (nauseous).  Having: ? A fast heartbeat. ? A headache. ? A change in your vision. ? Tingling or no feeling (numbness) around your mouth, lips, or tongue. ? Jerky movements that you cannot control (seizure).  Having trouble with: ? Moving (coordination). ? Sleeping. ? Passing out (fainting). ? Getting upset easily (irritability). Low blood sugar can happen to people who have diabetes and people who do not have diabetes. Low blood sugar can  happen quickly, and it can be an emergency. Treating low blood sugar Low blood sugar is often treated by eating or drinking something sugary right away, such as:  Fruit juice, 4-6 oz (120-150 mL).  Regular soda (not diet soda), 4-6 oz (120-150 mL).  Low-fat milk, 4 oz (120 mL).  Several pieces of hard candy.  Sugar or honey, 1 Tbsp (15 mL). Treating low blood sugar if you have diabetes If you can think clearly and swallow safely, follow the 15:15 rule:  Take 15 grams of a fast-acting carb (carbohydrate). Talk with your doctor about how much you should take.  Always keep a source of fast-acting carb with you, such as: ? Sugar tablets (glucose pills). Take 3-4 pills. ? 6-8 pieces of hard candy. ? 4-6 oz (120-150 mL) of fruit juice. ? 4-6 oz (120-150 mL) of regular (not diet) soda. ? 1 Tbsp (15 mL) honey or sugar.  Check your blood sugar 15 minutes after you take the carb.  If your blood sugar is still at or below 70 mg/dL (3.9 mmol/L), take 15 grams of a carb again.  If your blood sugar does not go above 70 mg/dL (3.9 mmol/L) after 3 tries, get help right away.  After your blood sugar goes back to normal, eat a meal or a snack within 1 hour.  Treating very low blood sugar If your blood sugar is at or below 54 mg/dL (3 mmol/L), you have very low blood sugar (severe hypoglycemia). This may also cause:  Passing out.  Jerky movements you cannot control (seizure).  Losing consciousness (coma). This is an emergency. Do not wait to see if the symptoms will go away. Get medical help right away. Call your local emergency services (911 in the [place] Do not drive yourself to the hospital. If you have very low blood sugar and you cannot eat or drink, you may need a glucagon shot (injection). A family member or friend should learn how to check your blood sugar and how to give you a glucagon shot. Ask your doctor if you need to have a glucagon shot kit at home. Follow these instructions at  home: General instructions  Take over-the-counter and prescription medicines only as told by your doctor.  Stay aware of your blood sugar as told by your doctor.  Limit alcohol intake to no more than 1 drink a day for nonpregnant women  and 2 drinks a day for men. One drink equals 12 oz of beer (355 mL), 5 oz of wine (148 mL), or 1 oz of hard liquor (44 mL).  Keep all follow-up visits as told by your doctor. This is important. If you have diabetes:   Follow your diabetes care plan as told by your doctor. Make sure you: ? Know the signs of low blood sugar. ? Take your medicines as told. ? Follow your exercise and meal plan. ? Eat on time. Do not skip meals. ? Check your blood sugar as often as told by your doctor. Always check it before and after exercise. ? Follow your sick day plan when you cannot eat or drink normally. Make this plan ahead of time with your doctor.  Share your diabetes care plan with: ? Your work or school. ? People you live with.  Check your pee (urine) for ketones: ? When you are sick. ? As told by your doctor.  Carry a card or wear jewelry that says you have diabetes. Contact a doctor if:  You have trouble keeping your blood sugar in your target range.  You have low blood sugar often. Get help right away if:  You still have symptoms after you eat or drink something sugary.  Your blood sugar is at or below 54 mg/dL (3 mmol/L).  You have jerky movements that you cannot control.  You pass out. These symptoms may be an emergency. Do not wait to see if the symptoms will go away. Get medical help right away. Call your local emergency services (911 in the [place] Do not drive yourself to the hospital. Summary  Hypoglycemia happens when the level of sugar (glucose) in your blood is too low.  Low blood sugar can happen to people who have diabetes and people who do not have diabetes. Low blood sugar can happen quickly, and it can be an emergency.  Make  sure you know the signs of low blood sugar and know how to treat it.  Always keep a source of sugar (fast-acting carb) with you to treat low blood sugar. This information is not intended to replace advice given to you by your health care provider. Make sure you discuss any questions you have with your health care provider. Document Revised: 07/16/2018 Document Reviewed: 04/28/2015 Elsevier Patient Education  [employer].   If you have lab work done today you will be contacted with your lab results within the next 2 weeks.  If you have not heard from [place] then please contact us. The fastest way to get your results is to register for My Chart.   IF you received an x-ray today, you will receive an invoice from [facility]. Please contact [facility] at [phone] with questions or concerns regarding your invoice.   IF you received labwork today, you will receive an invoice from [place]. Please contact [facility] at [phone] with questions or concerns regarding your invoice.   Our billing staff will not be able to assist you with questions regarding bills from these companies.  You will be contacted with the lab results as soon as they are available. The fastest way to get your results is to activate your My Chart account. Instructions are located on the last page of this paperwork. If you have not heard from [place] regarding the results in 2 weeks, please contact this office.

## 2020-03-10 NOTE — Progress Notes (Signed)
Subjective:  Patient ID: [name], male    DOB: [birth year]  Age: 86 y.o. MRN: [id number]  CC:  Chief Complaint  Patient presents with  . Follow-up    on diabetes. Pt reports no issues with BS since last OV. Pt needs [place] to resend the [employer] system to [place] the sensor was to expensive at his local pharmacy.   . Health Matrix    Health Matrix- heart failure ICD-10: I509 and Hypertensive heart and kidney disease with heart failure ICD-10: I130 )    HPI [name] presents for   Diabetes: With stage IIIb chronic kidney disease, microalbuminuria, CAD, hyperglycemia, on insulin. Metformin previously discontinued due to the CKD, followed by nephrology.  Previously had been on 8 units of 70/30 twice per day.  Previous home readings have been  Reportedly lower than in office and inconsistent with A1c.  Tried to order new meter for him last time with freestyle libre - not using yet, some question about   Commended on his diet changes and walking last visit. Insulin was increased to 9 units twice per day based on last A1c.  Advised to follow-up in 3 to 4 weeks at September 2 lab visit.  Have not seen him since his August visit. He does take statin with Lipitor 10 mg daily, ACE inhibitor with lisinopril 20 mg daily. Home readings: fasting 120 Postprandial: 120-130.  No symptomatic lows - low 120, high of 400 - soon after last visit, diet related at that time.  Denies reading over 200 since isolated 400.  Denies missed doses of insulin - 9 units twice per day.  Microalbumin: Ratio 345 in July 2020 Optho, foot exam, pneumovax: Up-to-date  Lab Results  Component Value Date   HGBA1C 9.6 (A) 03/10/2020   HGBA1C 10.1 (H) 12/09/2019   HGBA1C 9.3 (H) 09/07/2019   Lab Results  Component Value Date   MICROALBUR 83.3 05/17/2015   [place] 83 10/07/2019   CREATININE 1.98 (H) 10/07/2019   CHF with hypertensive heart disease and renal disease with CHF. Cardiologist Dr. [name].   Appointment in August. Nephrology, Dr. Royce Macadamia, appointment in August.  Plan for home blood pressure monitoring to adjust med doses, daughter has helped with medication. Last echocardiogram in June revealed normal left ventricular systolic function with moderate LVH and grade 1 diastolic dysfunction.  Pedal edema had improved with higher diuretic dose at his August cardiology visit. He is treated with amlodipine, hydralazine, lisinopril, furosemide. Denies missed doses of meds. Exercising at [facility] - 2 times per week. Bicycle, stretching.  Home BP- no recent readings.  No CP/dyspnea.  Has had some deaths in family, denies depression or need for counseling at this time - managing ok.  bloodwork with nephrology next week with appt in 2 weeks with Dr. Royce Macadamia.   BP Readings from Last 3 Encounters:  03/10/20 (!) 150/76  12/16/19 (!) 146/76  11/23/19 (!) 146/76    History Patient Active Problem List   Diagnosis Date Noted  . CHF (congestive heart failure) ([place]) 09/07/2019  . Chronic renal insufficiency, stage 3 (moderate) ([place]) 09/07/2019  . Lower extremity edema 08/18/2019  . Osteoarthritis of right knee 03/04/2018  . Rib pain on left side 07/01/2016  . Fall 12/14/2015  . Elevated serum creatinine 12/14/2015  . Ileus ([place]) 12/14/2015  . Multiple fractures of ribs of left side 12/12/2015  . Prostate cancer ([place]) 05/26/2014  . Blood in the urine 12/01/2013  . Cystitis, radiation 12/01/2013  . Hematuria  12/01/2013  . 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 Male erectile dysfunction 11/26/2013  . Pulsatile tinnitus 11/19/2013  . Rectal bleeding 12/23/2012  . Insulin dependent type 2 diabetes mellitus, controlled ([place])   . Essential hypertension   . Chronic back pain   . ED (erectile dysfunction)   . Anemia   . Dyslipidemia, goal LDL below 70   . CAD S/P percutaneous coronary angioplasty 05/08/2008  . History of ventricular tachycardia 05/08/2008  . PREMATURE VENTRICULAR CONTRACTIONS  05/08/2008   Past Medical History:  Diagnosis Date  . Allergy   . Anemia   . CAD (coronary artery disease)    pci to LAD 10/09; stable CAD by cath 05/31/08; myoview 04/11/11- no ishcemia, EF 67%; echo 05/15/09- EF>55%, mod calcification of the aortic valve leaflets  . CAD S/P percutaneous coronary angioplasty 05/08/2008   LAD PCI with DES 2009- Myoview low risk 2013  . CHF (congestive heart failure) ([place]) 09/07/2019  . Chronic back pain   . Cystitis, radiation 12/01/2013  . ED (erectile dysfunction)   . Elevated serum creatinine 12/14/2015  . Essential hypertension, benign   . Hematuria 12/01/2013  . Hyperlipidemia   . Ileus ([place]) 12/14/2015  . Insulin dependent type 2 diabetes mellitus, controlled ([place])   . Malignant neoplasm of prostate ([place]) 11/26/2013  . Multiple rib fractures    left 9th, 10th, and 11th posterior rib fractures S/P fall 12/09/2015/notes 12/12/2015  . Osteoarthritis of right knee 03/04/2018  . PREMATURE VENTRICULAR CONTRACTIONS 05/08/2008   Qualifier: Diagnosis of  By: Rayann Heman, MD, Jeneen Rinks    . Prostate cancer ([place])    s/p  . Rectal bleeding 12/23/2012   Evaluated by Dr Benson Norway GI patient with history of radiation proctitis. Patient due for colonoscopy on 04/2014   . Type II or unspecified type diabetes mellitus without mention of complication, not stated as uncontrolled   . Ventricular tachycardia ([place])    resuscitated; monitor 05/2008; attempted T ablation 2/10- aborted due to inappropriate substrate   Past Surgical History:  Procedure Laterality Date  . CARDIAC CATHETERIZATION  05/31/08   EF 55-60%, stable lesions, medical therapy  . COLECTOMY  '80's   polyps  . CORONARY ANGIOPLASTY WITH STENT PLACEMENT  01/19/08   PCI stent to mid LAD with Promus DES 27.5x28  . IR GENERIC HISTORICAL  01/03/2016   IR THORACENTESIS ASP PLEURAL SPACE W/IMG GUIDE 01/03/2016 [facility]-INTERV RAD  . KNEE ARTHROPLASTY Right 03/04/2018   Procedure: COMPUTER ASSISTED TOTAL KNEE ARTHROPLASTY;  Surgeon: Rod Can, MD;  Location: [facility]S;  Service: Orthopedics;  Laterality: Right;  . PTCA  01/2008   No Known Allergies Prior to Admission medications   Medication Sig Start Date End Date Taking? Authorizing Provider  amLODipine (NORVASC) 10 MG tablet Take 1 tablet (10 mg total) by mouth daily. 11/23/19  Yes Lorretta Harp, MD  atorvastatin (LIPITOR) 10 MG tablet Take 1 tablet (10 mg total) by mouth daily. 11/23/19  Yes Lorretta Harp, MD  Blood Glucose Monitoring Suppl (ACCU-CHEK AVIVA PLUS) w/Device KIT Test blood sugar 3 times daily. Dx code: E11.22 04/03/16  Yes [name], MD  Continuous Blood Gluc Sensor ([place]) MISC 1 application by Does not apply route daily. 11/08/19  Yes [name], MD  ferrous sulfate 325 (65 FE) MG tablet Take 325 mg by mouth daily with breakfast.   Yes [provider]  furosemide (LASIX) 40 MG tablet Take 1 tablet (40 mg total) by mouth 2 (two) times daily. As  needed for swelling 11/23/19  Yes Runell Gess, MD  glucose blood (ACCU-CHEK AVIVA PLUS) test strip Test blood sugar 3 times daily. Dx code: E11.22 04/03/16  Yes Shade Flood, MD  insulin NPH-regular Human (NOVOLIN 70/30) (70-30) 100 UNIT/ML injection ADMINISTER 8 UNITS UNDER THE SKIN TWICE DAILY WITH A MEAL 04/22/19  Yes Shade Flood, MD  Insulin Syringe-Needle U-100 (B-D INS SYR HALF-UNIT .3CC/31G) 31G X 5/16" 0.3 ML MISC Use daily at bedtime to inject insulin. Dx code: 250.00 06/21/13  Yes Shade Flood, MD  Lancets (ACCU-CHEK MULTICLIX) lancets Test blood sugar 3 times daily. Dx code: E11.22 04/03/16  Yes Shade Flood, MD  lisinopril (ZESTRIL) 20 MG tablet Take 1 tablet (20 mg total) by mouth daily. 11/23/19  Yes Runell Gess, MD  Multiple Vitamin (MULTIVITAMIN) tablet Take 1 tablet by mouth daily. COMPLETE SENIOR VITAMINS AND M   Yes [provider]  Omega-3 Fatty Acids (FISH OIL) 1000 MG CAPS Take 2 capsules by mouth daily.    Yes  [provider]  Vitamin D, Cholecalciferol, 1000 UNITS TABS Take 1 tablet by mouth daily.    Yes [provider]  carvedilol (COREG) 25 MG tablet Take 1 tablet (25 mg total) by mouth 2 (two) times daily. 11/23/19 02/21/20  Runell Gess, MD  docusate sodium (COLACE) 100 MG capsule Colace 100 mg capsule   100 mg by oral route. Patient not taking: Reported on 03/10/2020 03/04/18   [provider]  hydrALAZINE (APRESOLINE) 50 MG tablet  09/27/19   [provider]  mirabegron ER (MYRBETRIQ) 25 MG TB24 tablet Take 25 mg by mouth daily.  Patient not taking: Reported on 03/10/2020    [provider]   Social History   Socioeconomic History  . Marital status: Legally Separated    Spouse name: Not on file  . Number of children: 7  . Years of education: Not on file  . Highest education level: Not on file  Occupational History  . Occupation: RETIRED    Employer: RETIRED  Tobacco Use  . Smoking status: Former Smoker    Packs/day: 0.00    Years: 15.00    Pack years: 0.00    Types: Cigarettes    Quit date: 05/10/2017    Years since quitting: 2.8  . Smokeless tobacco: Never Used  . Tobacco comment: Patient smokes occasionally. Not everyday.  Vaping Use  . Vaping Use: Never used  Substance and Sexual Activity  . Alcohol use: No  . Drug use: No  . Sexual activity: Never  Other Topics Concern  . Not on file  Social History Narrative  . Not on file   Social Determinants of Health   Financial Resource Strain:   . Difficulty of Paying Living Expenses: Not on file  Food Insecurity:   . Worried About Programme researcher, broadcasting/film/video in the Last Year: Not on file  . Ran Out of Food in the Last Year: Not on file  Transportation Needs:   . Lack of Transportation (Medical): Not on file  . Lack of Transportation (Non-Medical): Not on file  Physical Activity:   . Days of Exercise per Week: Not on file  . Minutes of Exercise per Session: Not on file  Stress:     . Feeling of Stress : Not on file  Social Connections:   . Frequency of Communication with Friends and Family: Not on file  . Frequency of Social Gatherings with Friends and Family: Not on  file  . Attends Religious Services: Not on file  . Active Member of Clubs or Organizations: Not on file  . Attends Archivist Meetings: Not on file  . Marital Status: Not on file  Intimate Partner Violence:   . Fear of Current or Ex-Partner: Not on file  . Emotionally Abused: Not on file  . Physically Abused: Not on file  . Sexually Abused: Not on file    Review of Systems  Constitutional: Negative for fatigue and unexpected weight change.  Eyes: Negative for visual disturbance.  Respiratory: Negative for cough, chest tightness and shortness of breath.   Cardiovascular: Negative for chest pain, palpitations and leg swelling.  Gastrointestinal: Negative for abdominal pain and blood in stool.  Neurological: Negative for dizziness, light-headedness and headaches.     Objective:   Vitals:   03/10/20 0849 03/10/20 1007  BP: (!) 155/76 (!) 150/76  Pulse: 74   Temp: 98.7 F (37.1 C)   TempSrc: Temporal   SpO2: 99%   Weight: 233 lb (105.7 kg)   Height: 6' (1.829 m)    Repeat 150/76.   Physical Exam Vitals reviewed.  Constitutional:      Appearance: He is well-developed.  HENT:     Head: Normocephalic and atraumatic.  Eyes:     Pupils: Pupils are equal, round, and reactive to light.  Neck:     Vascular: No carotid bruit or JVD.  Cardiovascular:     Rate and Rhythm: Normal rate and regular rhythm.     Heart sounds: Normal heart sounds. No murmur heard.   Pulmonary:     Effort: Pulmonary effort is normal.     Breath sounds: Normal breath sounds. No rales.  Musculoskeletal:     Right lower leg: No edema.     Left lower leg: No edema.  Skin:    General: Skin is warm and dry.  Neurological:     Mental Status: He is alert and oriented to person, place, and time.      Results for orders placed or performed in visit on 03/10/20  POCT glucose (manual entry)  Result Value Ref Range   POC Glucose 142 (A) 70 - 99 mg/dl  POCT glycosylated hemoglobin (Hb A1C)  Result Value Ref Range   Hemoglobin A1C 9.6 (A) 4.0 - 5.6 %   HbA1c POC (<> result, manual entry)     HbA1c, POC (prediabetic range)     HbA1c, POC (controlled diabetic range)        Assessment & Plan:  [name] is a 86 y.o. male . Type 2 diabetes mellitus with hyperglycemia, with long-term current use of insulin (HCC) - Plan: POCT glucose (manual entry), POCT glycosylated hemoglobin (Hb A1C), Microalbumin / creatinine urine ratio Type 2 diabetes mellitus with stage 3b chronic kidney disease, with long-term current use of insulin ([place]) - Plan: POCT glucose (manual entry), POCT glycosylated hemoglobin (Hb A1C), Microalbumin / creatinine urine ratio  -Uncontrolled, increase insulin to 10 units twice per day.  Hypoglycemia precautions, 6-week follow-up  Essential hypertension  -Decreased control, recommended home monitoring, then advised myself or his nephrologist regarding home readings to decide on med changes.  Has close follow-up with nephrologist in the next few weeks.  Diastolic congestive heart failure, unspecified HF chronicity (HCC) Hypertensive heart and renal disease with congestive heart failure ([place])  -Asymptomatic, has ongoing follow-up with cardiology, recent echo as above.  Type 2 diabetes mellitus with microalbuminuria, with long-term current use of insulin ([place]) - Plan:  insulin NPH-regular Human (NOVOLIN 70/30) (70-30) 100 UNIT/ML injection  -Check microalbumin, continue ACE inhibitor  Meds ordered this encounter  Medications  . insulin NPH-regular Human (NOVOLIN 70/30) (70-30) 100 UNIT/ML injection    Sig: Inject 10 Units into the skin 2 (two) times daily with a meal.    Dispense:  15 mL    Refill:  5   Patient Instructions    Increase your insulin to 10 units  twice per day for now.  Watch diet, watch for any low blood sugar symptoms, see information below.  If those occur let me know right away.  Recheck in 6 weeks.  Blood pressure running slightly high here today.  Check your readings at home and let me know or Dr. Mercy Riding office know about those readings over the next 1 week.  May need to adjust your medications.  Return to the clinic or go to the nearest emergency room if any of your symptoms worsen or new symptoms occur.    Type 2 Diabetes Mellitus, Self Care, Adult When you have type 2 diabetes (type 2 diabetes mellitus), you must make sure your blood sugar (glucose) stays in a healthy range. You can do this with:  Nutrition.  Exercise.  Lifestyle changes.  Medicines or insulin, if needed.  Support from your doctors and others. How to stay aware of blood sugar   Check your blood sugar level every day, as often as told.  Have your A1c (hemoglobin A1c) level checked two or more times a year. Have it checked more often if your doctor tells you to. Your doctor will set personal treatment goals for you. Generally, you should have these blood sugar levels:  Before meals (preprandial): 80-130 mg/dL (4.7-1.3 mmol/L).  After meals (postprandial): below 180 mg/dL (10 mmol/L).  A1c level: less than 7%. How to manage high and low blood sugar Signs of high blood sugar High blood sugar is called hyperglycemia. Know the signs of high blood sugar. Signs may include:  Feeling: ? Thirsty. ? Hungry. ? Very tired.  Needing to pee (urinate) more than usual.  Blurry vision. Signs of low blood sugar Low blood sugar is called hypoglycemia. This is when blood sugar is at or below 70 mg/dL (3.9 mmol/L). Signs may include:  Feeling: ? Hungry. ? Worried or nervous (anxious). ? Sweaty and clammy. ? Confused. ? Dizzy. ? Sleepy. ? Sick to your stomach (nauseous).  Having: ? A fast heartbeat. ? A headache. ? A change in your  vision. ? Jerky movements that you cannot control (seizure). ? Tingling or no feeling (numbness) around your mouth, lips, or tongue.  Having trouble with: ? Moving (coordination). ? Sleeping. ? Passing out (fainting). ? Getting upset easily (irritability). Treating low blood sugar To treat low blood sugar, eat or drink something sugary right away. If you can think clearly and swallow safely, follow the 15:15 rule:  Take 15 grams of a fast-acting carb (carbohydrate). Talk with your doctor about how much you should take.  Some fast-acting carbs are: ? Sugar tablets (glucose pills). Take 3-4 pills. ? 6-8 pieces of hard candy. ? 4-6 oz (120-150 mL) of fruit juice. ? 4-6 oz (120-150 mL) of regular (not diet) soda. ? 1 Tbsp (15 mL) honey or sugar.  Check your blood sugar 15 minutes after you take the carb.  If your blood sugar is still at or below 70 mg/dL (3.9 mmol/L), take 15 grams of a carb again.  If your blood sugar does not go  above 70 mg/dL (3.9 mmol/L) after 3 tries, get help right away.  After your blood sugar goes back to normal, eat a meal or a snack within 1 hour. Treating very low blood sugar If your blood sugar is at or below 54 mg/dL (3 mmol/L), you have very low blood sugar (severe hypoglycemia). This is an emergency. Do not wait to see if the symptoms will go away. Get medical help right away. Call your local emergency services (911 in the [place] If you have very low blood sugar and you cannot eat or drink, you may need a glucagon shot (injection). A family member or friend should learn how to check your blood sugar and how to give you a glucagon shot. Ask your doctor if you need to have a glucagon shot kit at home. Follow these instructions at home: Medicine  Take insulin and diabetes medicines as told.  If your doctor says you should take more or less insulin and medicines, do this exactly as told.  Do not run out of insulin or medicines. Having diabetes can raise  your risk for other long-term conditions. These include heart disease and kidney disease. Your doctor may prescribe medicines to help you not have these problems. Food   Make healthy food choices. These include: ? Chicken, fish, egg whites, and beans. ? Oats, whole wheat, bulgur, brown rice, quinoa, and millet. ? Fresh fruits and vegetables. ? Low-fat dairy products. ? Nuts, avocado, olive oil, and canola oil.  Meet with a food specialist (dietitian). He or she can help you make an eating plan that is right for you.  Follow instructions from your doctor about what you cannot eat or drink.  Drink enough fluid to keep your pee (urine) pale yellow.  Keep track of carbs that you eat. Do this by reading food labels and learning food serving sizes.  Follow your sick day plan when you cannot eat or drink normally. Make this plan with your doctor so it is ready to use. Activity  Exercise 3 or more times a week.  Do not go more than 2 days without exercising.  Talk with your doctor before you start a new exercise. Your doctor may need to tell you to change: ? How much insulin or medicines you take. ? How much food you eat. Lifestyle  Do not use any tobacco products. These include cigarettes, chewing tobacco, and e-cigarettes. If you need help quitting, ask your doctor.  Ask your doctor how much alcohol is safe for you.  Learn to deal with stress. If you need help with this, ask your doctor. Body care   Stay up to date with your shots (immunizations).  Have your eyes and feet checked by a doctor as often as told.  Check your skin and feet every day. Check for cuts, bruises, redness, blisters, or sores.  Brush your teeth and gums two times a day. Floss one or more times a day.  Go to the dentist one or more times every 6 months.  Stay at a healthy weight. General instructions  Take over-the-counter and prescription medicines only as told by your doctor.  Share your diabetes  care plan with: ? Your work or school. ? People you live with.  Carry a card or wear jewelry that says you have diabetes.  Keep all follow-up visits as told by your doctor. This is important. Questions to ask your doctor  Do I need to meet with a diabetes educator?  Where can I find  a support group for people with diabetes? Where to find more information To learn more about diabetes, visit:  American Diabetes Association: www.diabetes.org  American Association of Diabetes Educators: www.diabeteseducator.org Summary  When you have type 2 diabetes, you must make sure your blood sugar (glucose) stays in a healthy range.  Check your blood sugar every day, as often as told.  Having diabetes can raise your risk for other conditions. Your doctor may prescribe medicines to help you not have these problems.  Keep all follow-up visits as told by your doctor. This is important. This information is not intended to replace advice given to you by your health care provider. Make sure you discuss any questions you have with your health care provider. Document Revised: 09/15/2017 Document Reviewed: 04/28/2015 Elsevier Patient Education  [place].  Hypoglycemia Hypoglycemia is when the sugar (glucose) level in your blood is too low. Signs of low blood sugar may include:  Feeling: ? Hungry. ? Worried or nervous (anxious). ? Sweaty and clammy. ? Confused. ? Dizzy. ? Sleepy. ? Sick to your stomach (nauseous).  Having: ? A fast heartbeat. ? A headache. ? A change in your vision. ? Tingling or no feeling (numbness) around your mouth, lips, or tongue. ? Jerky movements that you cannot control (seizure).  Having trouble with: ? Moving (coordination). ? Sleeping. ? Passing out (fainting). ? Getting upset easily (irritability). Low blood sugar can happen to people who have diabetes and people who do not have diabetes. Low blood sugar can happen quickly, and it can be an  emergency. Treating low blood sugar Low blood sugar is often treated by eating or drinking something sugary right away, such as:  Fruit juice, 4-6 oz (120-150 mL).  Regular soda (not diet soda), 4-6 oz (120-150 mL).  Low-fat milk, 4 oz (120 mL).  Several pieces of hard candy.  Sugar or honey, 1 Tbsp (15 mL). Treating low blood sugar if you have diabetes If you can think clearly and swallow safely, follow the 15:15 rule:  Take 15 grams of a fast-acting carb (carbohydrate). Talk with your doctor about how much you should take.  Always keep a source of fast-acting carb with you, such as: ? Sugar tablets (glucose pills). Take 3-4 pills. ? 6-8 pieces of hard candy. ? 4-6 oz (120-150 mL) of fruit juice. ? 4-6 oz (120-150 mL) of regular (not diet) soda. ? 1 Tbsp (15 mL) honey or sugar.  Check your blood sugar 15 minutes after you take the carb.  If your blood sugar is still at or below 70 mg/dL (3.9 mmol/L), take 15 grams of a carb again.  If your blood sugar does not go above 70 mg/dL (3.9 mmol/L) after 3 tries, get help right away.  After your blood sugar goes back to normal, eat a meal or a snack within 1 hour.  Treating very low blood sugar If your blood sugar is at or below 54 mg/dL (3 mmol/L), you have very low blood sugar (severe hypoglycemia). This may also cause:  Passing out.  Jerky movements you cannot control (seizure).  Losing consciousness (coma). This is an emergency. Do not wait to see if the symptoms will go away. Get medical help right away. Call your local emergency services (911 in the [place] Do not drive yourself to the hospital. If you have very low blood sugar and you cannot eat or drink, you may need a glucagon shot (injection). A family member or friend should learn how to check your  blood sugar and how to give you a glucagon shot. Ask your doctor if you need to have a glucagon shot kit at home. Follow these instructions at home: General  instructions  Take over-the-counter and prescription medicines only as told by your doctor.  Stay aware of your blood sugar as told by your doctor.  Limit alcohol intake to no more than 1 drink a day for nonpregnant women and 2 drinks a day for men. One drink equals 12 oz of beer (355 mL), 5 oz of wine (148 mL), or 1 oz of hard liquor (44 mL).  Keep all follow-up visits as told by your doctor. This is important. If you have diabetes:   Follow your diabetes care plan as told by your doctor. Make sure you: ? Know the signs of low blood sugar. ? Take your medicines as told. ? Follow your exercise and meal plan. ? Eat on time. Do not skip meals. ? Check your blood sugar as often as told by your doctor. Always check it before and after exercise. ? Follow your sick day plan when you cannot eat or drink normally. Make this plan ahead of time with your doctor.  Share your diabetes care plan with: ? Your work or school. ? People you live with.  Check your pee (urine) for ketones: ? When you are sick. ? As told by your doctor.  Carry a card or wear jewelry that says you have diabetes. Contact a doctor if:  You have trouble keeping your blood sugar in your target range.  You have low blood sugar often. Get help right away if:  You still have symptoms after you eat or drink something sugary.  Your blood sugar is at or below 54 mg/dL (3 mmol/L).  You have jerky movements that you cannot control.  You pass out. These symptoms may be an emergency. Do not wait to see if the symptoms will go away. Get medical help right away. Call your local emergency services (911 in the [place] Do not drive yourself to the hospital. Summary  Hypoglycemia happens when the level of sugar (glucose) in your blood is too low.  Low blood sugar can happen to people who have diabetes and people who do not have diabetes. Low blood sugar can happen quickly, and it can be an emergency.  Make sure you know the  signs of low blood sugar and know how to treat it.  Always keep a source of sugar (fast-acting carb) with you to treat low blood sugar. This information is not intended to replace advice given to you by your health care provider. Make sure you discuss any questions you have with your health care provider. Document Revised: 07/16/2018 Document Reviewed: 04/28/2015 Elsevier Patient Education  [employer].   If you have lab work done today you will be contacted with your lab results within the next 2 weeks.  If you have not heard from [place] then please contact us. The fastest way to get your results is to register for My Chart.   IF you received an x-ray today, you will receive an invoice from [facility]. Please contact [facility] at [phone] with questions or concerns regarding your invoice.   IF you received labwork today, you will receive an invoice from [place]. Please contact [facility] at [phone] with questions or concerns regarding your invoice.   Our billing staff will not be able to assist you with questions regarding bills from these companies.  You will be  contacted with the lab results as soon as they are available. The fastest way to get your results is to activate your My Chart account. Instructions are located on the last page of this paperwork. If you have not heard from us regarding the results in 2 weeks, please contact this office.         Signed, Betul Brisky, MD [facility]  

## 2020-03-11 LAB — MICROALBUMIN / CREATININE URINE RATIO
Creatinine, Urine: 130.7 mg/dL
Microalb/Creat Ratio: 1117 mg/g creat — ABNORMAL HIGH (ref 0–29)
Microalbumin, Urine: 1459.8 ug/mL

## 2020-03-13 DIAGNOSIS — N1832 Chronic kidney disease, stage 3b: Secondary | ICD-10-CM | POA: Diagnosis not present

## 2020-03-24 DIAGNOSIS — N2581 Secondary hyperparathyroidism of renal origin: Secondary | ICD-10-CM | POA: Diagnosis not present

## 2020-03-24 DIAGNOSIS — R809 Proteinuria, unspecified: Secondary | ICD-10-CM | POA: Diagnosis not present

## 2020-03-24 DIAGNOSIS — E872 Acidosis: Secondary | ICD-10-CM | POA: Diagnosis not present

## 2020-03-24 DIAGNOSIS — N184 Chronic kidney disease, stage 4 (severe): Secondary | ICD-10-CM | POA: Diagnosis not present

## 2020-03-24 DIAGNOSIS — D631 Anemia in chronic kidney disease: Secondary | ICD-10-CM | POA: Diagnosis not present

## 2020-03-24 DIAGNOSIS — I129 Hypertensive chronic kidney disease with stage 1 through stage 4 chronic kidney disease, or unspecified chronic kidney disease: Secondary | ICD-10-CM | POA: Diagnosis not present

## 2020-03-24 DIAGNOSIS — E1122 Type 2 diabetes mellitus with diabetic chronic kidney disease: Secondary | ICD-10-CM | POA: Diagnosis not present

## 2020-04-26 ENCOUNTER — Telehealth: Payer: Self-pay

## 2020-04-26 ENCOUNTER — Other Ambulatory Visit: Payer: Self-pay

## 2020-04-26 ENCOUNTER — Encounter: Payer: Self-pay | Admitting: Family Medicine

## 2020-04-26 ENCOUNTER — Ambulatory Visit (INDEPENDENT_AMBULATORY_CARE_PROVIDER_SITE_OTHER): Payer: Medicare HMO | Admitting: Family Medicine

## 2020-04-26 VITALS — BP 150/74 | HR 78 | Temp 98.0°F | Ht 72.0 in | Wt 237.0 lb

## 2020-04-26 DIAGNOSIS — I10 Essential (primary) hypertension: Secondary | ICD-10-CM

## 2020-04-26 DIAGNOSIS — E1165 Type 2 diabetes mellitus with hyperglycemia: Secondary | ICD-10-CM | POA: Diagnosis not present

## 2020-04-26 DIAGNOSIS — N1832 Chronic kidney disease, stage 3b: Secondary | ICD-10-CM | POA: Diagnosis not present

## 2020-04-26 DIAGNOSIS — E1129 Type 2 diabetes mellitus with other diabetic kidney complication: Secondary | ICD-10-CM | POA: Diagnosis not present

## 2020-04-26 DIAGNOSIS — Z794 Long term (current) use of insulin: Secondary | ICD-10-CM

## 2020-04-26 DIAGNOSIS — R809 Proteinuria, unspecified: Secondary | ICD-10-CM

## 2020-04-26 DIAGNOSIS — E1122 Type 2 diabetes mellitus with diabetic chronic kidney disease: Secondary | ICD-10-CM

## 2020-04-26 LAB — GLUCOSE, POCT (MANUAL RESULT ENTRY): POC Glucose: 133 mg/dl — AB (ref 70–99)

## 2020-04-26 MED ORDER — CONTINUOUS GLUCOSE MONITOR SUP KIT: 1.0000 | PACK | 0 refills | Status: AC

## 2020-04-26 MED ORDER — BLOOD PRESS MONITOR/M-L CUFF MISC: 1.0000 "application " | Freq: Every day | 0 refills | Status: AC

## 2020-04-26 NOTE — Telephone Encounter (Signed)
Pt was in office today and he wanted the Glucose meter to be sent to [facility]. I have faxed it over to them at [phone] per their request.

## 2020-04-26 NOTE — Patient Instructions (Addendum)
  Keep a record of your blood pressures and blood sugars outside of the office and let me know those readings in next few weeks. I sent prescriptions to [facility].   Return to the clinic or go to the nearest emergency room if any of your symptoms worsen or new symptoms occur.    If you have lab work done today you will be contacted with your lab results within the next 2 weeks.  If you have not heard from [place] then please contact us. The fastest way to get your results is to register for My Chart.   IF you received an x-ray today, you will receive an invoice from [facility]. Please contact [facility] at [phone] with questions or concerns regarding your invoice.   IF you received labwork today, you will receive an invoice from [place]. Please contact [facility] at [phone] with questions or concerns regarding your invoice.   Our billing staff will not be able to assist you with questions regarding bills from these companies.  You will be contacted with the lab results as soon as they are available. The fastest way to get your results is to activate your My Chart account. Instructions are located on the last page of this paperwork. If you have not heard from [place] regarding the results in 2 weeks, please contact this office.

## 2020-04-26 NOTE — Progress Notes (Unsigned)
Subjective:  Patient ID: [name], male    DOB: [birth year]  Age: 87 y.o. MRN: [id number]  CC:  Chief Complaint  Patient presents with  . Follow-up    On diabetes since last medication change. Pt reports no issues with BS since last OV. Pt reports he hasn't been able to check his BS for the past 3 weeks due to loosing his BS monitor on a flight over the holiday. PT would like a free style libra sent into [facility] [place] [facility].    HPI [name] presents for   Diabetes: Complicated by CKD, stage IIIb, microalbuminuria, CAD, hyperglycemia, on insulin. Previously had to discontinue metformin due to CKD. Reports improved kidney fxn and BP normal at nephrology visit in December 4 moths follow up.  70/30 insulin has been slowly increase for improved control.  Increased to 10 units twice per day at last visit December 3.  Home readings have been inconsistent with an office readings and A1c.  Does not have meter as above, needs new prescription. He is on a statin with Lipitor, ACE inhibitor with lisinopril. Fasting - lost meter. No readings since last visit. No feelings of hypoglycemia or new side effects of higher insulin dose and no missed doses.   No missed meds for BP.   Lab Results  Component Value Date   HGBA1C 9.6 (A) 03/10/2020   HGBA1C 10.1 (H) 12/09/2019   HGBA1C 9.3 (H) 09/07/2019   Lab Results  Component Value Date   MICROALBUR 83.3 05/17/2015   LDLCALC 83 10/07/2019   CREATININE 1.98 (H) 10/07/2019    History Patient Active Problem List   Diagnosis Date Noted  . CHF (congestive heart failure) ([place]) 09/07/2019  . Chronic renal insufficiency, stage 3 (moderate) ([place]) 09/07/2019  . Lower extremity edema 08/18/2019  . Osteoarthritis of right knee 03/04/2018  . Rib pain on left side 07/01/2016  . Fall 12/14/2015  . Elevated serum creatinine 12/14/2015  . Ileus ([place]) 12/14/2015  . Multiple fractures of ribs of left side 12/12/2015  . Prostate cancer ([place])  05/26/2014  . Blood in the urine 12/01/2013  . Cystitis, radiation 12/01/2013  . Hematuria 12/01/2013  . 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 Male erectile dysfunction 11/26/2013  . Pulsatile tinnitus 11/19/2013  . Rectal bleeding 12/23/2012  . Insulin dependent type 2 diabetes mellitus, controlled ([place])   . Essential hypertension   . Chronic back pain   . ED (erectile dysfunction)   . Anemia   . Dyslipidemia, goal LDL below 70   . CAD S/P percutaneous coronary angioplasty 05/08/2008  . History of ventricular tachycardia 05/08/2008  . PREMATURE VENTRICULAR CONTRACTIONS 05/08/2008   Past Medical History:  Diagnosis Date  . Allergy   . Anemia   . CAD (coronary artery disease)    pci to LAD 10/09; stable CAD by cath 05/31/08; myoview 04/11/11- no ishcemia, EF 67%; echo 05/15/09- EF>55%, mod calcification of the aortic valve leaflets  . CAD S/P percutaneous coronary angioplasty 05/08/2008   LAD PCI with DES 2009- Myoview low risk 2013  . CHF (congestive heart failure) ([place]) 09/07/2019  . Chronic back pain   . Cystitis, radiation 12/01/2013  . ED (erectile dysfunction)   . Elevated serum creatinine 12/14/2015  . Essential hypertension, benign   . Hematuria 12/01/2013  . Hyperlipidemia   . Ileus ([place]) 12/14/2015  . Insulin dependent type 2 diabetes mellitus, controlled ([place])   . Malignant neoplasm of prostate ([place]) 11/26/2013  . Multiple rib fractures  left 9th, 10th, and 11th posterior rib fractures S/P fall 12/09/2015/notes 12/12/2015  . Osteoarthritis of right knee 03/04/2018  . PREMATURE VENTRICULAR CONTRACTIONS 05/08/2008   Qualifier: Diagnosis of  By: Rayann Heman, MD, Jeneen Rinks    . Prostate cancer ([place])    s/p  . Rectal bleeding 12/23/2012   Evaluated by Dr Benson [place] GI patient with history of radiation proctitis. Patient due for colonoscopy on 04/2014   . Type II or unspecified type diabetes mellitus without mention of complication, not stated as uncontrolled   . Ventricular tachycardia ([place])     resuscitated; monitor 05/2008; attempted T ablation 2/10- aborted due to inappropriate substrate   Past Surgical History:  Procedure Laterality Date  . CARDIAC CATHETERIZATION  05/31/08   EF 55-60%, stable lesions, medical therapy  . COLECTOMY  '80's   polyps  . CORONARY ANGIOPLASTY WITH STENT PLACEMENT  01/19/08   PCI stent to mid LAD with Promus DES 27.5x28  . IR GENERIC HISTORICAL  01/03/2016   IR THORACENTESIS ASP PLEURAL SPACE W/IMG GUIDE 01/03/2016 [facility]-INTERV RAD  . KNEE ARTHROPLASTY Right 03/04/2018   Procedure: COMPUTER ASSISTED TOTAL KNEE ARTHROPLASTY;  Surgeon: Rod Can, MD;  Location: [facility] ORS;  Service: Orthopedics;  Laterality: Right;  . PTCA  01/2008   No Known Allergies Prior to Admission medications   Medication Sig Start Date End Date Taking? Authorizing Provider  amLODipine (NORVASC) 10 MG tablet Take 1 tablet (10 mg total) by mouth daily. 11/23/19  Yes Lorretta Harp, MD  atorvastatin (LIPITOR) 10 MG tablet Take 1 tablet (10 mg total) by mouth daily. 11/23/19  Yes Lorretta Harp, MD  Blood Glucose Monitoring Suppl (ACCU-CHEK AVIVA PLUS) w/Device KIT Test blood sugar 3 times daily. Dx code: E11.22 04/03/16  Yes [name], MD  Continuous Blood Gluc Sensor ([place]) MISC 1 application by Does not apply route daily. 11/08/19  Yes [name], MD  ferrous sulfate 325 (65 FE) MG tablet Take 325 mg by mouth daily with breakfast.   Yes [provider]  furosemide (LASIX) 40 MG tablet Take 1 tablet (40 mg total) by mouth 2 (two) times daily. As needed for swelling 11/23/19  Yes Lorretta Harp, MD  glucose blood (ACCU-CHEK AVIVA PLUS) test strip Test blood sugar 3 times daily. Dx code: E11.22 04/03/16  Yes [name], MD  hydrALAZINE (APRESOLINE) 50 MG tablet  09/27/19  Yes [provider]  insulin NPH-regular Human (NOVOLIN 70/30) (70-30) 100 UNIT/ML injection Inject 10 Units into the skin 2 (two) times daily with a  meal. 03/10/20  Yes [name], MD  Insulin Syringe-Needle U-100 (B-D INS SYR HALF-UNIT .3CC/31G) 31G X 5/16" 0.3 ML MISC Use daily at bedtime to inject insulin. Dx code: 250.00 06/21/13  Yes [name], MD  Lancets (ACCU-CHEK MULTICLIX) lancets Test blood sugar 3 times daily. Dx code: E11.22 04/03/16  Yes [name], MD  lisinopril (ZESTRIL) 20 MG tablet Take 1 tablet (20 mg total) by mouth daily. 11/23/19  Yes Lorretta Harp, MD  mirabegron ER (MYRBETRIQ) 25 MG TB24 tablet Take 25 mg by mouth daily.   Yes [provider]  Multiple Vitamin (MULTIVITAMIN) tablet Take 1 tablet by mouth daily. COMPLETE SENIOR VITAMINS AND M   Yes [provider]  Omega-3 Fatty Acids (FISH OIL) 1000 MG CAPS Take 2 capsules by mouth daily.    Yes [provider]  Vitamin D, Cholecalciferol, 1000 UNITS TABS Take 1 tablet by mouth daily.  Yes [provider]  carvedilol (COREG) 25 MG tablet Take 1 tablet (25 mg total) by mouth 2 (two) times daily. 11/23/19 02/21/20  Lorretta Harp, MD   Social History   Socioeconomic History  . Marital status: Legally Separated    Spouse name: Not on file  . Number of children: 7  . Years of education: Not on file  . Highest education level: Not on file  Occupational History  . Occupation: RETIRED    Employer: RETIRED  Tobacco Use  . Smoking status: Former Smoker    Packs/day: 0.00    Years: 15.00    Pack years: 0.00    Types: Cigarettes    Quit date: 05/10/2017    Years since quitting: 2.9  . Smokeless tobacco: Never Used  . Tobacco comment: Patient smokes occasionally. Not everyday.  Vaping Use  . Vaping Use: Never used  Substance and Sexual Activity  . Alcohol use: No  . Drug use: No  . Sexual activity: Never  Other Topics Concern  . Not on file  Social History Narrative  . Not on file   Social Determinants of Health   Financial Resource Strain: Not on file  Food Insecurity: Not on file   Transportation Needs: Not on file  Physical Activity: Not on file  Stress: Not on file  Social Connections: Not on file  Intimate Partner Violence: Not on file    Review of Systems  Constitutional: Negative for fatigue and unexpected weight change.  Eyes: Negative for visual disturbance.  Respiratory: Negative for cough, chest tightness and shortness of breath.   Cardiovascular: Negative for chest pain, palpitations and leg swelling.  Gastrointestinal: Negative for abdominal pain and blood in stool.  Neurological: Negative for dizziness, light-headedness and headaches.     Objective:   Vitals:   04/26/20 1017 04/26/20 1022  BP: (!) 161/84 (!) 150/74  Pulse: 78   Temp: 98 F (36.7 C)   TempSrc: Temporal   SpO2: 98%   Weight: 237 lb (107.5 kg)   Height: 6' (1.829 m)      Physical Exam Vitals reviewed.  Constitutional:      Appearance: He is well-developed and well-nourished.  HENT:     Head: Normocephalic and atraumatic.  Eyes:     Extraocular Movements: EOM normal.     Pupils: Pupils are equal, round, and reactive to light.  Neck:     Vascular: No carotid bruit or JVD.  Cardiovascular:     Rate and Rhythm: Normal rate and regular rhythm.     Heart sounds: Normal heart sounds. No murmur heard.   Pulmonary:     Effort: Pulmonary effort is normal.     Breath sounds: Normal breath sounds. No rales.  Musculoskeletal:        General: No edema.  Skin:    General: Skin is warm and dry.  Neurological:     Mental Status: He is alert and oriented to person, place, and time.  Psychiatric:        Mood and Affect: Mood and affect normal.     Results for orders placed or performed in visit on 04/26/20  POCT glucose (manual entry)  Result Value Ref Range   POC Glucose 133 (A) 70 - 99 mg/dl      Assessment & Plan:  TRAVON CROCHET is a 87 y.o. male . Type 2 diabetes mellitus with stage 3b chronic kidney disease, with long-term current use of insulin ([place]) -  Plan: Continuous Glucose  Monitor Sup KIT, POCT glucose (manual entry)  Type 2 diabetes mellitus with hyperglycemia, with long-term current use of insulin ([facility]) - Plan: Continuous Glucose Monitor Sup KIT, POCT glucose (manual entry)  Essential hypertension - Plan: Blood Pressure Monitoring (BLOOD PRESS MONITOR/M-L CUFF) MISC  Type 2 diabetes mellitus with microalbuminuria, with long-term current use of insulin ([facility]) - Plan: Continuous Glucose Monitor Sup KIT, POCT glucose (manual entry)  In office glucose readings stable, home monitoring recommended to help decide on further changes to meds, no changes for now.  Also recommended home blood pressure monitoring with update on readings in the next few weeks.  New meters ordered.   Meds ordered this encounter  Medications  . Continuous Glucose Monitor Sup KIT    Sig: 1 kit by Does not apply route as directed.    Dispense:  1 kit    Refill:  0    Requests freestyle libre. IDDM with CKD.  Kit and monitoring device - QS 3 months with 3 refills.  Dx: E11.22, E11.65  . Blood Pressure Monitoring (BLOOD PRESS MONITOR/M-L CUFF) MISC    Sig: 1 application by Does not apply route daily.    Dispense:  1 each    Refill:  0   Patient Instructions    Keep a record of your blood pressures and blood sugars outside of the office and let me know those readings in next few weeks. I sent prescriptions to [facility].   Return to the clinic or go to the nearest emergency room if any of your symptoms worsen or new symptoms occur.    If you have lab work done today you will be contacted with your lab results within the next 2 weeks.  If you have not heard from [place] then please contact us. The fastest way to get your results is to register for My Chart.   IF you received an x-ray today, you will receive an invoice from [facility]iology. Please contact [facility] Radiology at [phone] with questions or concerns regarding your invoice.   IF you received  labwork today, you will receive an invoice from [place]. Please contact [facility] at [phone] with questions or concerns regarding your invoice.   Our billing staff will not be able to assist you with questions regarding bills from these companies.  You will be contacted with the lab results as soon as they are available. The fastest way to get your results is to activate your My Chart account. Instructions are located on the last page of this paperwork. If you have not heard from [place] regarding the results in 2 weeks, please contact this office.         Signed, [name], MD [facility]

## 2020-04-27 ENCOUNTER — Encounter: Payer: Self-pay | Admitting: Family Medicine

## 2020-05-13 ENCOUNTER — Other Ambulatory Visit: Payer: Self-pay | Admitting: Cardiovascular Disease

## 2020-05-13 DIAGNOSIS — E785 Hyperlipidemia, unspecified: Secondary | ICD-10-CM

## 2020-05-31 ENCOUNTER — Other Ambulatory Visit: Payer: Self-pay | Admitting: Cardiovascular Disease

## 2020-05-31 DIAGNOSIS — I10 Essential (primary) hypertension: Secondary | ICD-10-CM

## 2020-06-14 ENCOUNTER — Ambulatory Visit: Payer: Medicare HMO | Admitting: Family Medicine

## 2020-06-28 ENCOUNTER — Other Ambulatory Visit: Payer: Self-pay | Admitting: Cardiovascular Disease

## 2020-06-28 DIAGNOSIS — I10 Essential (primary) hypertension: Secondary | ICD-10-CM

## 2020-06-30 ENCOUNTER — Ambulatory Visit (INDEPENDENT_AMBULATORY_CARE_PROVIDER_SITE_OTHER): Payer: Medicare HMO | Admitting: Family Medicine

## 2020-06-30 ENCOUNTER — Other Ambulatory Visit: Payer: Self-pay

## 2020-06-30 ENCOUNTER — Encounter: Payer: Self-pay | Admitting: Family Medicine

## 2020-06-30 DIAGNOSIS — N1832 Chronic kidney disease, stage 3b: Secondary | ICD-10-CM

## 2020-06-30 DIAGNOSIS — E1129 Type 2 diabetes mellitus with other diabetic kidney complication: Secondary | ICD-10-CM | POA: Diagnosis not present

## 2020-06-30 DIAGNOSIS — E1122 Type 2 diabetes mellitus with diabetic chronic kidney disease: Secondary | ICD-10-CM | POA: Diagnosis not present

## 2020-06-30 DIAGNOSIS — Z794 Long term (current) use of insulin: Secondary | ICD-10-CM | POA: Diagnosis not present

## 2020-06-30 DIAGNOSIS — R809 Proteinuria, unspecified: Secondary | ICD-10-CM

## 2020-06-30 LAB — POCT GLYCOSYLATED HEMOGLOBIN (HGB A1C): Hemoglobin A1C: 9 % — AB (ref 4.0–5.6)

## 2020-06-30 LAB — GLUCOSE, POCT (MANUAL RESULT ENTRY): POC Glucose: 158 mg/dl — AB (ref 70–99)

## 2020-06-30 MED ORDER — FREESTYLE LIBRE SENSOR SYSTEM MISC: 1.0000 "application " | Freq: Every day | 2 refills | Status: AC

## 2020-06-30 MED ORDER — NOVOLIN 70/30 (70-30) 100 UNIT/ML ~~LOC~~ SUSP
11.0000 [IU] | Freq: Two times a day (BID) | SUBCUTANEOUS | 5 refills | Status: DC
Start: 2020-06-30 — End: 2023-02-10

## 2020-06-30 MED ORDER — FREESTYLE LIBRE SENSOR SYSTEM MISC
1.0000 | Freq: Every day | 2 refills | Status: DC
Start: 2020-06-30 — End: 2020-06-30

## 2020-06-30 NOTE — Patient Instructions (Addendum)
My new practice:  [facility] Address: [address] [place], [place], [place] [address] Phone: [phone]  If cholesterol is not checked with your bloodwork at nephrology - let me know and I will place an order.   Increase insulin to 11 units twice per day. If any low blood sugars let me know right away.   Managing Your Hypertension Hypertension, also called high blood pressure, is when the force of the blood pressing against the walls of the arteries is too strong. Arteries are blood vessels that carry blood from your heart throughout your body. Hypertension forces the heart to work harder to pump blood and may cause the arteries to become narrow or stiff. Understanding blood pressure readings Your personal target blood pressure may vary depending on your medical conditions, your age, and other factors. A blood pressure reading includes a higher number over a lower number. Ideally, your blood pressure should be below 120/80. You should know that:  The first, or top, number is called the systolic pressure. It is a measure of the pressure in your arteries as your heart beats.  The second, or bottom number, is called the diastolic pressure. It is a measure of the pressure in your arteries as the heart relaxes. Blood pressure is classified into four stages. Based on your blood pressure reading, your health care provider may use the following stages to determine what type of treatment you need, if any. Systolic pressure and diastolic pressure are measured in a unit called mmHg. Normal  Systolic pressure: below 357.  Diastolic pressure: below 80. Elevated  Systolic pressure: 017-793.  Diastolic pressure: below 80. Hypertension stage 1  Systolic pressure: 903-009.  Diastolic pressure: 23-30. Hypertension stage 2  Systolic pressure: 076 or above.  Diastolic pressure: 90 or above. How can this condition affect me? Managing your hypertension is an important  responsibility. Over time, hypertension can damage the arteries and decrease blood flow to important parts of the body, including the brain, heart, and kidneys. Having untreated or uncontrolled hypertension can lead to:  A heart attack.  A stroke.  A weakened blood vessel (aneurysm).  Heart failure.  Kidney damage.  Eye damage.  Metabolic syndrome.  Memory and concentration problems.  Vascular dementia. What actions can I take to manage this condition? Hypertension can be managed by making lifestyle changes and possibly by taking medicines. Your health care provider will help you make a plan to bring your blood pressure within a normal range. Nutrition  Eat a diet that is high in fiber and potassium, and low in salt (sodium), added sugar, and fat. An example eating plan is called the Dietary Approaches to Stop Hypertension (DASH) diet. To eat this way: ? Eat plenty of fresh fruits and vegetables. Try to fill one-half of your plate at each meal with fruits and vegetables. ? Eat whole grains, such as whole-wheat pasta, brown rice, or whole-grain bread. Fill about one-fourth of your plate with whole grains. ? Eat low-fat dairy products. ? Avoid fatty cuts of meat, processed or cured meats, and poultry with skin. Fill about one-fourth of your plate with lean proteins such as fish, chicken without skin, beans, eggs, and tofu. ? Avoid pre-made and processed foods. These tend to be higher in sodium, added sugar, and fat.  Reduce your daily sodium intake. Most people with hypertension should eat less than 1,500 mg of sodium a day.   Lifestyle  Work with your health care provider to maintain a healthy body weight  or to lose weight. Ask what an ideal weight is for you.  Get at least 30 minutes of exercise that causes your heart to beat faster (aerobic exercise) most days of the week. Activities may include walking, swimming, or biking.  Include exercise to strengthen your muscles  (resistance exercise), such as weight lifting, as part of your weekly exercise routine. Try to do these types of exercises for 30 minutes at least 3 days a week.  Do not use any products that contain nicotine or tobacco, such as cigarettes, e-cigarettes, and chewing tobacco. If you need help quitting, ask your health care provider.  Control any long-term (chronic) conditions you have, such as high cholesterol or diabetes.  Identify your sources of stress and find ways to manage stress. This may include meditation, deep breathing, or making time for fun activities.   Alcohol use  Do not drink alcohol if: ? Your health care provider tells you not to drink. ? You are pregnant, may be pregnant, or are planning to become pregnant.  If you drink alcohol: ? Limit how much you use to:  0-1 drink a day for women.  0-2 drinks a day for men. ? Be aware of how much alcohol is in your drink. In the [place] one drink equals one 12 oz bottle of beer (355 mL), one 5 oz glass of wine (148 mL), or one 1 oz glass of hard liquor (44 mL). Medicines Your health care provider may prescribe medicine if lifestyle changes are not enough to get your blood pressure under control and if:  Your systolic blood pressure is 130 or higher.  Your diastolic blood pressure is 80 or higher. Take medicines only as told by your health care provider. Follow the directions carefully. Blood pressure medicines must be taken as told by your health care provider. The medicine does not work as well when you skip doses. Skipping doses also puts you at risk for problems. Monitoring Before you monitor your blood pressure:  Do not smoke, drink caffeinated beverages, or exercise within 30 minutes before taking a measurement.  Use the bathroom and empty your bladder (urinate).  Sit quietly for at least 5 minutes before taking measurements. Monitor your blood pressure at home as told by your health care provider. To do this:  Sit  with your back straight and supported.  Place your feet flat on the floor. Do not cross your legs.  Support your arm on a flat surface, such as a table. Make sure your upper arm is at heart level.  Each time you measure, take two or three readings one minute apart and record the results. You may also need to have your blood pressure checked regularly by your health care provider.   General information  Talk with your health care provider about your diet, exercise habits, and other lifestyle factors that may be contributing to hypertension.  Review all the medicines you take with your health care provider because there may be side effects or interactions.  Keep all visits as told by your health care provider. Your health care provider can help you create and adjust your plan for managing your high blood pressure. Where to find more information  National Heart, Lung, and Blood Institute: https://wilson-eaton.com/  American Heart Association: www.heart.org Contact a health care provider if:  You think you are having a reaction to medicines you have taken.  You have repeated (recurrent) headaches.  You feel dizzy.  You have swelling in your ankles.  You  have trouble with your vision. Get help right away if:  You develop a severe headache or confusion.  You have unusual weakness or numbness, or you feel faint.  You have severe pain in your chest or abdomen.  You vomit repeatedly.  You have trouble breathing. These symptoms may represent a serious problem that is an emergency. Do not wait to see if the symptoms will go away. Get medical help right away. Call your local emergency services (911 in the [place] Do not drive yourself to the hospital. Summary  Hypertension is when the force of blood pumping through your arteries is too strong. If this condition is not controlled, it may put you at risk for serious complications.  Your personal target blood pressure may vary depending on  your medical conditions, your age, and other factors. For most people, a normal blood pressure is less than 120/80.  Hypertension is managed by lifestyle changes, medicines, or both.  Lifestyle changes to help manage hypertension include losing weight, eating a healthy, low-sodium diet, exercising more, stopping smoking, and limiting alcohol. This information is not intended to replace advice given to you by your health care provider. Make sure you discuss any questions you have with your health care provider. Document Revised: 04/30/2019 Document Reviewed: 02/23/2019 Elsevier Patient Education  2021 [employer].     If you have lab work done today you will be contacted with your lab results within the next 2 weeks.  If you have not heard from [place] then please contact us. The fastest way to get your results is to register for My Chart.   IF you received an x-ray today, you will receive an invoice from [facility]. Please contact [facility] at [phone] with questions or concerns regarding your invoice.   IF you received labwork today, you will receive an invoice from [facility]. Please contact [facility] at [phone] with questions or concerns regarding your invoice.   Our billing staff will not be able to assist you with questions regarding bills from these companies.  You will be contacted with the lab results as soon as they are available. The fastest way to get your results is to activate your My Chart account. Instructions are located on the last page of this paperwork. If you have not heard from [place] regarding the results in 2 weeks, please contact this office.

## 2020-06-30 NOTE — Progress Notes (Signed)
Subjective:  Patient ID: [name], male    DOB: 22-Feb-1934  Age: 87 y.o. MRN: [id number]  CC:  Chief Complaint  Patient presents with  . Follow-up    On hypertension diabetes. Pt reports his BP was good last time he was at his kidney doctor. Pt reports his son recently passed away this might have something to do with his elevated BP.Pt reports his BS has been doing well since last OV. Pt reports his current medication seems to work well with no side effects.    HPI [name] presents for  Diabetes: Complicated by CKD stage IIIb, microalbuminuria, CAD, hyperglycemia on insulin.  Metformin discontinued due to CKD.  Has had to adjust his 70/30 insulin slowly for improved control.  Home readings have been inconsistent with an office readings and A1c, new meter has been prescribed previously.  He is on statin and ACE inhibitor. Prior dosing insulin 10u bid. No missed doses.  No recent home readings. No feelings of low blood sugars.  Microalbumin:elevated in 03/2020- on ACE-I.  Optho, foot exam, pneumovax: up to date   Lab Results  Component Value Date   HGBA1C 9.0 (A) 06/30/2020   HGBA1C 9.6 (A) 03/10/2020   HGBA1C 10.1 (H) 12/09/2019   Lab Results  Component Value Date   MICROALBUR 83.3 05/17/2015   LDLCALC 83 10/07/2019   CREATININE 1.98 (H) 10/07/2019   Hypertension: Nephrologist Dr. Royce Macadamia, note reviewed from December 17.  Continued on same regimen.  58-monthfollow-up planned.  - appt next month.  Blood pressure 130/80 at that visit. Amlodipine 10 mg daily, lisinopril 20 mg daily, Has Lasix 40 mg twice daily - takes BID. Carvedilol 25 mg BID.  Reports improved readings at nephrology.  Unfortunately his son recently passed away recently and children's mother passed away recently[place]  Feel like he has support needed with his pastor, friends and family at this time.  No alcohol.  Min added salt to food.  Rare pizza.  Home readings - none.   BP Readings from Last 3 Encounters:  06/30/20 (!) 152/76  04/26/20 (!) 150/74  03/10/20 (!) 150/76   Lab Results  Component Value Date   CREATININE 1.98 (H) 10/07/2019     History Patient Active Problem List   Diagnosis Date Noted  . CHF (congestive heart failure) ([facility] 09/07/2019  . Chronic renal insufficiency, stage 3 (moderate) ([facility] 09/07/2019  . Lower extremity edema 08/18/2019  . Osteoarthritis of right knee 03/04/2018  . Rib pain on left side 07/01/2016  . Fall 12/14/2015  . Elevated serum creatinine 12/14/2015  . Ileus ([facility] 12/14/2015  . Multiple fractures of ribs of left side 12/12/2015  . Prostate cancer ([facility] [place] 05/26/2014  . Blood in the urine 12/01/2013  . Cystitis, radiation 12/01/2013  . Hematuria 12/01/2013  . 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 Male erectile dysfunction 11/26/2013  . Pulsatile tinnitus 11/19/2013  . Rectal bleeding 12/23/2012  . Insulin dependent type 2 diabetes mellitus, controlled ([facility] [place]   . Essential hypertension   . Chronic back pain   . ED (erectile dysfunction)   . Anemia   . Dyslipidemia, goal LDL below 70   . CAD S/P percutaneous coronary angioplasty 05/08/2008  . History of ventricular tachycardia 05/08/2008  . PREMATURE VENTRICULAR CONTRACTIONS 05/08/2008   Past Medical History:  Diagnosis Date  . Allergy   . Anemia   . CAD (coronary artery disease)    pci to LAD 10/09; stable CAD by cath 05/31/08;  myoview 04/11/11- no ishcemia, EF 67%; echo 05/15/09- EF>55%, mod calcification of the aortic valve leaflets  . CAD S/P percutaneous coronary angioplasty 05/08/2008   LAD PCI with DES 2009- Myoview low risk 2013  . CHF (congestive heart failure) ([place]) 09/07/2019  . Chronic back pain   . Cystitis, radiation 12/01/2013  . ED (erectile dysfunction)   . Elevated serum creatinine 12/14/2015  . Essential hypertension, benign   . Hematuria 12/01/2013  . Hyperlipidemia   . Ileus ([place]) 12/14/2015  . Insulin dependent type 2 diabetes mellitus,  controlled ([place])   . Malignant neoplasm of prostate ([place]) 11/26/2013  . Multiple rib fractures    left 9th, 10th, and 11th posterior rib fractures S/P fall 12/09/2015/notes 12/12/2015  . Osteoarthritis of right knee 03/04/2018  . PREMATURE VENTRICULAR CONTRACTIONS 05/08/2008   Qualifier: Diagnosis of  By: Rayann Heman, MD, Jeneen Rinks    . Prostate cancer ([place])    s/p  . Rectal bleeding 12/23/2012   Evaluated by Dr Benson [place] GI patient with history of radiation proctitis. Patient due for colonoscopy on 04/2014   . Type II or unspecified type diabetes mellitus without mention of complication, not stated as uncontrolled   . Ventricular tachycardia ([place])    resuscitated; monitor 05/2008; attempted T ablation 2/10- aborted due to inappropriate substrate   Past Surgical History:  Procedure Laterality Date  . CARDIAC CATHETERIZATION  05/31/08   EF 55-60%, stable lesions, medical therapy  . COLECTOMY  '80's   polyps  . CORONARY ANGIOPLASTY WITH STENT PLACEMENT  01/19/08   PCI stent to mid LAD with Promus DES 27.5x28  . IR GENERIC HISTORICAL  01/03/2016   IR THORACENTESIS ASP PLEURAL SPACE W/IMG GUIDE 01/03/2016 [facility]INTER[facility] RAD  . KNEE ARTHROPLASTY Right 03/04/2018   Procedure: COMPUTER ASSISTED TOTAL KNEE ARTHROPLASTY;  Surgeon: Rod Can, MD;  Location: [facility];  Service: Orthopedics;  Laterality: Right;  . PTCA  01/2008   No Known Allergies Prior to Admission medications   Medication Sig Start Date End Date Taking? Authorizing Provider  amLODipine (NORVASC) 10 MG tablet TAKE 1 TABLET EVERY DAY 06/28/20  Yes Lorretta Harp, MD  atorvastatin (LIPITOR) 10 MG tablet TAKE 1 TABLET (10 MG TOTAL) BY MOUTH DAILY. 05/15/20  Yes Lorretta Harp, MD  Blood Glucose Monitoring Suppl (ACCU-CHEK AVIVA PLUS) w/Device KIT Test blood sugar 3 times daily. Dx code: E11.22 04/03/16  Yes [name], MD  Blood Pressure Monitoring (BLOOD PRESS MONITOR/M-L CUFF) MISC 1 application by Does not apply route daily. 04/26/20  Yes  [name], MD  Continuous Blood Gluc Sensor ([place]) MISC 1 application by Does not apply route daily. 11/08/19  Yes [name], MD  Continuous Glucose Monitor Sup KIT 1 kit by Does not apply route as directed. 04/26/20  Yes [name], MD  ferrous sulfate 325 (65 FE) MG tablet Take 325 mg by mouth daily with breakfast.   Yes [provider]  furosemide (LASIX) 40 MG tablet Take 1 tablet (40 mg total) by mouth 2 (two) times daily. As needed for swelling 11/23/19  Yes Lorretta Harp, MD  glucose blood (ACCU-CHEK AVIVA PLUS) test strip Test blood sugar 3 times daily. Dx code: E11.22 04/03/16  Yes [name], MD  hydrALAZINE (APRESOLINE) 50 MG tablet  09/27/19  Yes [provider]  insulin NPH-regular Human (NOVOLIN 70/30) (70-30) 100 UNIT/ML injection Inject 10 Units into the skin 2 (two) times daily with a meal. 03/10/20  Yes [name]  R, MD  Insulin Syringe-Needle U-100 (B-D INS SYR HALF-UNIT .3CC/31G) 31G X 5/16" 0.3 ML MISC Use daily at bedtime to inject insulin. Dx code: 250.00 06/21/13  Yes [name], MD  Lancets (ACCU-CHEK MULTICLIX) lancets Test blood sugar 3 times daily. Dx code: E11.22 04/03/16  Yes [name], MD  lisinopril (ZESTRIL) 20 MG tablet TAKE 1 TABLET (20 MG TOTAL) BY MOUTH DAILY. 05/31/20  Yes Lorretta Harp, MD  mirabegron ER (MYRBETRIQ) 25 MG TB24 tablet Take 25 mg by mouth daily.   Yes [provider]  Multiple Vitamin (MULTIVITAMIN) tablet Take 1 tablet by mouth daily. COMPLETE SENIOR VITAMINS AND M   Yes [provider]  Omega-3 Fatty Acids (FISH OIL) 1000 MG CAPS Take 2 capsules by mouth daily.    Yes [provider]  Vitamin D, Cholecalciferol, 1000 UNITS TABS Take 1 tablet by mouth daily.    Yes [provider]  carvedilol (COREG) 25 MG tablet Take 1 tablet (25 mg total) by mouth 2 (two) times daily. 11/23/19 02/21/20  Lorretta Harp, MD   Social  History   Socioeconomic History  . Marital status: Legally Separated    Spouse name: Not on file  . Number of children: 7  . Years of education: Not on file  . Highest education level: Not on file  Occupational History  . Occupation: RETIRED    Employer: RETIRED  Tobacco Use  . Smoking status: Former Smoker    Packs/day: 0.00    Years: 15.00    Pack years: 0.00    Types: Cigarettes    Quit date: 05/10/2017    Years since quitting: 3.1  . Smokeless tobacco: Never Used  . Tobacco comment: Patient smokes occasionally. Not everyday.  Vaping Use  . Vaping Use: Never used  Substance and Sexual Activity  . Alcohol use: No  . Drug use: No  . Sexual activity: Never  Other Topics Concern  . Not on file  Social History Narrative  . Not on file   Social Determinants of Health   Financial Resource Strain: Not on file  Food Insecurity: Not on file  Transportation Needs: Not on file  Physical Activity: Not on file  Stress: Not on file  Social Connections: Not on file  Intimate Partner Violence: Not on file    Review of Systems  Constitutional: Negative for fatigue and unexpected weight change.  Eyes: Negative for visual disturbance.  Respiratory: Negative for cough, chest tightness and shortness of breath.   Cardiovascular: Negative for chest pain, palpitations and leg swelling.  Gastrointestinal: Negative for abdominal pain and blood in stool.  Neurological: Negative for dizziness, light-headedness and headaches.     Objective:   Vitals:   06/30/20 1528 06/30/20 1533  BP: (!) 173/83 (!) 152/76  Pulse: 79   Temp: 98.5 F (36.9 C)   TempSrc: Temporal   SpO2: 98%   Weight: 236 lb (107 kg)   Height: 6' (1.829 m)      Physical Exam Vitals reviewed.  Constitutional:      Appearance: He is well-developed.  HENT:     Head: Normocephalic and atraumatic.  Eyes:     Pupils: Pupils are equal, round, and reactive to light.  Neck:     Vascular: No carotid bruit or JVD.   Cardiovascular:     Rate and Rhythm: Normal rate and regular rhythm.     Heart sounds: Normal heart sounds. No murmur heard.   Pulmonary:  Effort: Pulmonary effort is normal.     Breath sounds: Normal breath sounds. No rales.  Skin:    General: Skin is warm and dry.  Neurological:     Mental Status: He is alert and oriented to person, place, and time.    Results for orders placed or performed in visit on 06/30/20  POCT glucose (manual entry)  Result Value Ref Range   POC Glucose 158 (A) 70 - 99 mg/dl  POCT glycosylated hemoglobin (Hb A1C)  Result Value Ref Range   Hemoglobin A1C 9.0 (A) 4.0 - 5.6 %   HbA1c POC (<> result, manual entry)     HbA1c, POC (prediabetic range)     HbA1c, POC (controlled diabetic range)       Assessment & Plan:  [name] is a 87 y.o. male . Type 2 diabetes mellitus with stage 3b chronic kidney disease, with long-term current use of insulin ([place]) - Plan: Continuous Blood Gluc Sensor ([place]) MISC, POCT glucose (manual entry), POCT glycosylated hemoglobin (Hb A1C), DISCONTINUED: Continuous Blood Gluc Sensor ([place]) MISC  Type 2 diabetes mellitus with microalbuminuria, with long-term current use of insulin ([facility]) - Plan: insulin NPH-regular Human (NOVOLIN 70/30) (70-30) 100 UNIT/ML injection  Still suboptimal diabetes control.  We will try 11 units insulin twice per day, watch for hypoglycemia with increases.  Continue follow-up with specialist including nephrology.  Plans for labs with nephrology, but if lipids are not checked, I can place lab only order.  Condolences given on recent deaths in the family.  He does report good support system in place at this time, denies depression, denies needs but advised to let me know if counseling or other needs arise.  Meds ordered this encounter  Medications  . DISCONTD: Continuous Blood Gluc Sensor (FREESTYLE LIBRE SENSOR SYSTEM) MISC    Sig: 1 application  by Does not apply route daily.    Dispense:  1 each    Refill:  2    Disp 1 system with qs of devices for 3 moths, 3 refills.  Dx: E11.21, N18.32, Z79.4  . Continuous Blood Gluc Sensor (FREESTYLE LIBRE SENSOR SYSTEM) MISC    Sig: 1 application by Does not apply route daily.    Dispense:  1 each    Refill:  2    Disp 1 system with qs of devices for 3 moths, 3 refills.  Dx: E11.21, N18.32, Z79.4  . insulin NPH-regular Human (NOVOLIN 70/30) (70-30) 100 UNIT/ML injection    Sig: Inject 11 Units into the skin 2 (two) times daily with a meal.    Dispense:  15 mL    Refill:  5   Patient Instructions   My new practice:  [facility] Address: [address], [place], [place] [address] Phone: [phone]  If cholesterol is not checked with your bloodwork at nephrology - let me know and I will place an order.   Increase insulin to 11 units twice per day. If any low blood sugars let me know right away.   Managing Your Hypertension Hypertension, also called high blood pressure, is when the force of the blood pressing against the walls of the arteries is too strong. Arteries are blood vessels that carry blood from your heart throughout your body. Hypertension forces the heart to work harder to pump blood and may cause the arteries to become narrow or stiff. Understanding blood pressure readings Your personal target blood pressure may vary depending on your  medical conditions, your age, and other factors. A blood pressure reading includes a higher number over a lower number. Ideally, your blood pressure should be below 120/80. You should know that:  The first, or top, number is called the systolic pressure. It is a measure of the pressure in your arteries as your heart beats.  The second, or bottom number, is called the diastolic pressure. It is a measure of the pressure in your arteries as the heart relaxes. Blood pressure is classified into four stages. Based on your blood  pressure reading, your health care provider may use the following stages to determine what type of treatment you need, if any. Systolic pressure and diastolic pressure are measured in a unit called mmHg. Normal  Systolic pressure: below 448.  Diastolic pressure: below 80. Elevated  Systolic pressure: 185-631.  Diastolic pressure: below 80. Hypertension stage 1  Systolic pressure: 497-026.  Diastolic pressure: 37-85. Hypertension stage 2  Systolic pressure: 885 or above.  Diastolic pressure: 90 or above. How can this condition affect me? Managing your hypertension is an important responsibility. Over time, hypertension can damage the arteries and decrease blood flow to important parts of the body, including the brain, heart, and kidneys. Having untreated or uncontrolled hypertension can lead to:  A heart attack.  A stroke.  A weakened blood vessel (aneurysm).  Heart failure.  Kidney damage.  Eye damage.  Metabolic syndrome.  Memory and concentration problems.  Vascular dementia. What actions can I take to manage this condition? Hypertension can be managed by making lifestyle changes and possibly by taking medicines. Your health care provider will help you make a plan to bring your blood pressure within a normal range. Nutrition  Eat a diet that is high in fiber and potassium, and low in salt (sodium), added sugar, and fat. An example eating plan is called the Dietary Approaches to Stop Hypertension (DASH) diet. To eat this way: ? Eat plenty of fresh fruits and vegetables. Try to fill one-half of your plate at each meal with fruits and vegetables. ? Eat whole grains, such as whole-wheat pasta, brown rice, or whole-grain bread. Fill about one-fourth of your plate with whole grains. ? Eat low-fat dairy products. ? Avoid fatty cuts of meat, processed or cured meats, and poultry with skin. Fill about one-fourth of your plate with lean proteins such as fish, chicken without  skin, beans, eggs, and tofu. ? Avoid pre-made and processed foods. These tend to be higher in sodium, added sugar, and fat.  Reduce your daily sodium intake. Most people with hypertension should eat less than 1,500 mg of sodium a day.   Lifestyle  Work with your health care provider to maintain a healthy body weight or to lose weight. Ask what an ideal weight is for you.  Get at least 30 minutes of exercise that causes your heart to beat faster (aerobic exercise) most days of the week. Activities may include walking, swimming, or biking.  Include exercise to strengthen your muscles (resistance exercise), such as weight lifting, as part of your weekly exercise routine. Try to do these types of exercises for 30 minutes at least 3 days a week.  Do not use any products that contain nicotine or tobacco, such as cigarettes, e-cigarettes, and chewing tobacco. If you need help quitting, ask your health care provider.  Control any long-term (chronic) conditions you have, such as high cholesterol or diabetes.  Identify your sources of stress and find ways to manage stress. This may include  meditation, deep breathing, or making time for fun activities.   Alcohol use  Do not drink alcohol if: ? Your health care provider tells you not to drink. ? You are pregnant, may be pregnant, or are planning to become pregnant.  If you drink alcohol: ? Limit how much you use to:  0-1 drink a day for women.  0-2 drinks a day for men. ? Be aware of how much alcohol is in your drink. In the [place] one drink equals one 12 oz bottle of beer (355 mL), one 5 oz glass of wine (148 mL), or one 1 oz glass of hard liquor (44 mL). Medicines Your health care provider may prescribe medicine if lifestyle changes are not enough to get your blood pressure under control and if:  Your systolic blood pressure is 130 or higher.  Your diastolic blood pressure is 80 or higher. Take medicines only as told by your health care  provider. Follow the directions carefully. Blood pressure medicines must be taken as told by your health care provider. The medicine does not work as well when you skip doses. Skipping doses also puts you at risk for problems. Monitoring Before you monitor your blood pressure:  Do not smoke, drink caffeinated beverages, or exercise within 30 minutes before taking a measurement.  Use the bathroom and empty your bladder (urinate).  Sit quietly for at least 5 minutes before taking measurements. Monitor your blood pressure at home as told by your health care provider. To do this:  Sit with your back straight and supported.  Place your feet flat on the floor. Do not cross your legs.  Support your arm on a flat surface, such as a table. Make sure your upper arm is at heart level.  Each time you measure, take two or three readings one minute apart and record the results. You may also need to have your blood pressure checked regularly by your health care provider.   General information  Talk with your health care provider about your diet, exercise habits, and other lifestyle factors that may be contributing to hypertension.  Review all the medicines you take with your health care provider because there may be side effects or interactions.  Keep all visits as told by your health care provider. Your health care provider can help you create and adjust your plan for managing your high blood pressure. Where to find more information  National Heart, Lung, and Blood Institute: https://wilson-eaton.com/  American Heart Association: www.heart.org Contact a health care provider if:  You think you are having a reaction to medicines you have taken.  You have repeated (recurrent) headaches.  You feel dizzy.  You have swelling in your ankles.  You have trouble with your vision. Get help right away if:  You develop a severe headache or confusion.  You have unusual weakness or numbness, or you feel  faint.  You have severe pain in your chest or abdomen.  You vomit repeatedly.  You have trouble breathing. These symptoms may represent a serious problem that is an emergency. Do not wait to see if the symptoms will go away. Get medical help right away. Call your local emergency services (911 in the [place] Do not drive yourself to the hospital. Summary  Hypertension is when the force of blood pumping through your arteries is too strong. If this condition is not controlled, it may put you at risk for serious complications.  Your personal target blood pressure may vary depending on your medical  conditions, your age, and other factors. For most people, a normal blood pressure is less than 120/80.  Hypertension is managed by lifestyle changes, medicines, or both.  Lifestyle changes to help manage hypertension include losing weight, eating a healthy, low-sodium diet, exercising more, stopping smoking, and limiting alcohol. This information is not intended to replace advice given to you by your health care provider. Make sure you discuss any questions you have with your health care provider. Document Revised: 04/30/2019 Document Reviewed: 02/23/2019 Elsevier Patient Education  2021 [employer].     If you have lab work done today you will be contacted with your lab results within the next 2 weeks.  If you have not heard from [place] then please contact us. The fastest way to get your results is to register for My Chart.   IF you received an x-ray today, you will receive an invoice from [facility]. Please contact [facility] at [phone] with questions or concerns regarding your invoice.   IF you received labwork today, you will receive an invoice from [facility]. Please contact [facility] at [phone] with questions or concerns regarding your invoice.   Our billing staff will not be able to assist you with questions regarding bills from these companies.  You will be  contacted with the lab results as soon as they are available. The fastest way to get your results is to activate your My Chart account. Instructions are located on the last page of this paperwork. If you have not heard from [place] regarding the results in 2 weeks, please contact this office.         Signed, [name], MD [facility]

## 2020-07-01 ENCOUNTER — Encounter: Payer: Self-pay | Admitting: Family Medicine

## 2020-07-19 DIAGNOSIS — N184 Chronic kidney disease, stage 4 (severe): Secondary | ICD-10-CM | POA: Diagnosis not present

## 2020-07-26 DIAGNOSIS — D631 Anemia in chronic kidney disease: Secondary | ICD-10-CM | POA: Diagnosis not present

## 2020-07-26 DIAGNOSIS — E872 Acidosis: Secondary | ICD-10-CM | POA: Diagnosis not present

## 2020-07-26 DIAGNOSIS — R809 Proteinuria, unspecified: Secondary | ICD-10-CM | POA: Diagnosis not present

## 2020-07-26 DIAGNOSIS — N1832 Chronic kidney disease, stage 3b: Secondary | ICD-10-CM | POA: Diagnosis not present

## 2020-07-26 DIAGNOSIS — E1122 Type 2 diabetes mellitus with diabetic chronic kidney disease: Secondary | ICD-10-CM | POA: Diagnosis not present

## 2020-07-26 DIAGNOSIS — I129 Hypertensive chronic kidney disease with stage 1 through stage 4 chronic kidney disease, or unspecified chronic kidney disease: Secondary | ICD-10-CM | POA: Diagnosis not present

## 2020-07-26 DIAGNOSIS — N2581 Secondary hyperparathyroidism of renal origin: Secondary | ICD-10-CM | POA: Diagnosis not present

## 2020-08-16 ENCOUNTER — Other Ambulatory Visit: Payer: Self-pay

## 2020-08-16 DIAGNOSIS — I350 Nonrheumatic aortic (valve) stenosis: Secondary | ICD-10-CM

## 2020-08-23 DIAGNOSIS — H5213 Myopia, bilateral: Secondary | ICD-10-CM | POA: Diagnosis not present

## 2020-09-12 ENCOUNTER — Other Ambulatory Visit: Payer: Self-pay

## 2020-09-12 ENCOUNTER — Ambulatory Visit (HOSPITAL_COMMUNITY): Payer: Medicare HMO | Attending: Internal Medicine

## 2020-09-12 DIAGNOSIS — I350 Nonrheumatic aortic (valve) stenosis: Secondary | ICD-10-CM | POA: Insufficient documentation

## 2020-09-12 LAB — ECHOCARDIOGRAM COMPLETE
AR max vel: 1.81 cm2
AV Area VTI: 1.95 cm2
AV Area mean vel: 1.69 cm2
AV Mean grad: 16 mmHg
AV Peak grad: 30 mmHg
Ao pk vel: 2.74 m/s
Area-P 1/2: 2.42 cm2
P 1/2 time: 431 msec
S' Lateral: 3.7 cm

## 2020-09-13 ENCOUNTER — Encounter (HOSPITAL_COMMUNITY): Payer: Self-pay

## 2020-09-13 ENCOUNTER — Emergency Department (HOSPITAL_COMMUNITY)
Admission: EM | Admit: 2020-09-13 | Discharge: 2020-09-13 | Disposition: A | Discharge status: Home / Self Care | Payer: Medicare HMO | Attending: Emergency Medicine | Admitting: Emergency Medicine

## 2020-09-13 ENCOUNTER — Other Ambulatory Visit: Payer: Self-pay

## 2020-09-13 DIAGNOSIS — Z79899 Other long term (current) drug therapy: Secondary | ICD-10-CM | POA: Insufficient documentation

## 2020-09-13 DIAGNOSIS — N183 Chronic kidney disease, stage 3 unspecified: Secondary | ICD-10-CM | POA: Diagnosis not present

## 2020-09-13 DIAGNOSIS — Z794 Long term (current) use of insulin: Secondary | ICD-10-CM | POA: Diagnosis not present

## 2020-09-13 DIAGNOSIS — Z955 Presence of coronary angioplasty implant and graft: Secondary | ICD-10-CM | POA: Insufficient documentation

## 2020-09-13 DIAGNOSIS — I130 Hypertensive heart and chronic kidney disease with heart failure and stage 1 through stage 4 chronic kidney disease, or unspecified chronic kidney disease: Secondary | ICD-10-CM | POA: Insufficient documentation

## 2020-09-13 DIAGNOSIS — I509 Heart failure, unspecified: Secondary | ICD-10-CM | POA: Diagnosis not present

## 2020-09-13 DIAGNOSIS — I2510 Atherosclerotic heart disease of native coronary artery without angina pectoris: Secondary | ICD-10-CM | POA: Diagnosis not present

## 2020-09-13 DIAGNOSIS — Z87891 Personal history of nicotine dependence: Secondary | ICD-10-CM | POA: Insufficient documentation

## 2020-09-13 DIAGNOSIS — E1122 Type 2 diabetes mellitus with diabetic chronic kidney disease: Secondary | ICD-10-CM | POA: Insufficient documentation

## 2020-09-13 DIAGNOSIS — R7989 Other specified abnormal findings of blood chemistry: Secondary | ICD-10-CM | POA: Diagnosis not present

## 2020-09-13 DIAGNOSIS — R319 Hematuria, unspecified: Secondary | ICD-10-CM | POA: Insufficient documentation

## 2020-09-13 DIAGNOSIS — Z96651 Presence of right artificial knee joint: Secondary | ICD-10-CM | POA: Insufficient documentation

## 2020-09-13 DIAGNOSIS — Z8546 Personal history of malignant neoplasm of prostate: Secondary | ICD-10-CM | POA: Insufficient documentation

## 2020-09-13 LAB — URINALYSIS, ROUTINE W REFLEX MICROSCOPIC
Bilirubin Urine: NEGATIVE
Glucose, UA: 150 mg/dL — AB
Ketones, ur: NEGATIVE mg/dL
Leukocytes,Ua: NEGATIVE
Nitrite: NEGATIVE
Protein, ur: 100 mg/dL — AB
RBC / HPF: >50 RBC/hpf — ABNORMAL HIGH (ref 0–5)
Specific Gravity, Urine: 1.012 (ref 1.005–1.030)
pH: 6 (ref 5.0–8.0)

## 2020-09-13 LAB — BASIC METABOLIC PANEL
Anion gap: 9 (ref 5–15)
BUN: 27 mg/dL — ABNORMAL HIGH (ref 8–23)
CO2: 23 mmol/L (ref 22–32)
Calcium: 8.6 mg/dL — ABNORMAL LOW (ref 8.9–10.3)
Chloride: 104 mmol/L (ref 98–111)
Creatinine, Ser: 2.21 mg/dL — ABNORMAL HIGH (ref 0.61–1.24)
GFR, Estimated: 29 mL/min — ABNORMAL LOW (ref >60)
Glucose, Bld: 281 mg/dL — ABNORMAL HIGH (ref 70–99)
Potassium: 4.4 mmol/L (ref 3.5–5.1)
Sodium: 136 mmol/L (ref 135–145)

## 2020-09-13 LAB — CBC WITH DIFFERENTIAL/PLATELET
Abs Immature Granulocytes: 0.01 10*3/uL (ref 0.00–0.07)
Basophils Absolute: 0 10*3/uL (ref 0.0–0.1)
Basophils Relative: 0 %
Eosinophils Absolute: 0.3 10*3/uL (ref 0.0–0.5)
Eosinophils Relative: 5 %
HCT: 40 % (ref 39.0–52.0)
Hemoglobin: 12.5 g/dL — ABNORMAL LOW (ref 13.0–17.0)
Immature Granulocytes: 0 %
Lymphocytes Relative: 23 %
Lymphs Abs: 1.3 10*3/uL (ref 0.7–4.0)
MCH: 27.2 pg (ref 26.0–34.0)
MCHC: 31.3 g/dL (ref 30.0–36.0)
MCV: 87.1 fL (ref 80.0–100.0)
Monocytes Absolute: 0.6 10*3/uL (ref 0.1–1.0)
Monocytes Relative: 11 %
Neutro Abs: 3.4 10*3/uL (ref 1.7–7.7)
Neutrophils Relative %: 61 %
Platelets: 213 10*3/uL (ref 150–400)
RBC: 4.59 MIL/uL (ref 4.22–5.81)
RDW: 14.1 % (ref 11.5–15.5)
WBC: 5.6 10*3/uL (ref 4.0–10.5)
nRBC: 0 % (ref 0.0–0.2)

## 2020-09-13 MED ORDER — SULFAMETHOXAZOLE-TRIMETHOPRIM 800-160 MG PO TABS: 1.0000 | ORAL_TABLET | Freq: Two times a day (BID) | ORAL | 0 refills | Status: AC

## 2020-09-13 MED ORDER — LIDOCAINE HCL URETHRAL/MUCOSAL 2 % EX GEL
1.0000 "application " | Freq: Once | CUTANEOUS | Status: AC
  Administered 2020-09-13: 1 via TOPICAL
  Filled 2020-09-13: qty 11

## 2020-09-13 NOTE — ED Triage Notes (Signed)
Hematuria x 3 days, denies pain. NAD

## 2020-09-13 NOTE — ED Notes (Signed)
Hospitalitis at bedside

## 2020-09-13 NOTE — ED Notes (Signed)
Straight catheter inserted without issue (77F coude)  Irrigated with 265ml.

## 2020-09-13 NOTE — Discharge Instructions (Addendum)
Call your primary care doctor or specialist as discussed in the next 2-3 days.   Return immediately back to the ER if:  Your symptoms worsen within the next 12-24 hours. You develop new symptoms such as new fevers, chills, pain difficulty urinating or any additional concerns.

## 2020-09-13 NOTE — ED Provider Notes (Signed)
[facility] EMERGENCY DEPARTMENT Provider Note   CSN: [id number] Arrival date & time: 09/13/20  [id number]     History No chief complaint on file.   [name] is a 87 y.o. male.  Patient presents chief complaint of blood in his urine.  He is notices for about 3 days.  He is noticed a small few clots but has no difficulty urinating.  Denies any pain no abdominal pain or flank pain.  Denies any fevers cough vomiting or chills.  He states he had a similar symptom several months ago lasted for 1 day.  He was seen by his urologist and does not recall any additional information from this.  He is been doing well until the last 2 days when he has recurrence of symptoms.  Denies fall or trauma otherwise.  States he is not on any kind of blood thinner or antiplatelet agent.        Past Medical History:  Diagnosis Date  . Allergy   . Anemia   . CAD (coronary artery disease)    pci to LAD 10/09; stable CAD by cath 05/31/08; myoview 04/11/11- no ishcemia, EF 67%; echo 05/15/09- EF>55%, mod calcification of the aortic valve leaflets  . CAD S/P percutaneous coronary angioplasty 05/08/2008   LAD PCI with DES 2009- Myoview low risk 2013  . CHF (congestive heart failure) ([place]) 09/07/2019  . Chronic back pain   . Cystitis, radiation 12/01/2013  . ED (erectile dysfunction)   . Elevated serum creatinine 12/14/2015  . Essential hypertension, benign   . Hematuria 12/01/2013  . Hyperlipidemia   . Ileus ([place]) 12/14/2015  . Insulin dependent type 2 diabetes mellitus, controlled ([place])   . Malignant neoplasm of prostate ([place]) 11/26/2013  . Multiple rib fractures    left 9th, 10th, and 11th posterior rib fractures S/P fall 12/09/2015/notes 12/12/2015  . Osteoarthritis of right knee 03/04/2018  . PREMATURE VENTRICULAR CONTRACTIONS 05/08/2008   Qualifier: Diagnosis of  By: Rayann Heman, MD, Jeneen Rinks    . Prostate cancer ([place])    s/p  . Rectal bleeding 12/23/2012   Evaluated by Dr Benson Norway GI patient with history of  radiation proctitis. Patient due for colonoscopy on 04/2014   . Type II or unspecified type diabetes mellitus without mention of complication, not stated as uncontrolled   . Ventricular tachycardia ([place])    resuscitated; monitor 05/2008; attempted T ablation 2/10- aborted due to inappropriate substrate    Patient Active Problem List   Diagnosis Date Noted  . CHF (congestive heart failure) ([place]) 09/07/2019  . Chronic renal insufficiency, stage 3 (moderate) ([place]) 09/07/2019  . Lower extremity edema 08/18/2019  . Osteoarthritis of right knee 03/04/2018  . Rib pain on left side 07/01/2016  . Fall 12/14/2015  . Elevated serum creatinine 12/14/2015  . Ileus ([place]) 12/14/2015  . Multiple fractures of ribs of left side 12/12/2015  . Prostate cancer ([place]) 05/26/2014  . Blood in the urine 12/01/2013  . Cystitis, radiation 12/01/2013  . Hematuria 12/01/2013  . 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 Male erectile dysfunction 11/26/2013  . Pulsatile tinnitus 11/19/2013  . Rectal bleeding 12/23/2012  . Insulin dependent type 2 diabetes mellitus, controlled ([place])   . Essential hypertension   . Chronic back pain   . ED (erectile dysfunction)   . Anemia   . Dyslipidemia, goal LDL below 70   . CAD S/P percutaneous coronary angioplasty 05/08/2008  . History of ventricular tachycardia 05/08/2008  . PREMATURE VENTRICULAR CONTRACTIONS 05/08/2008  Past Surgical History:  Procedure Laterality Date  . CARDIAC CATHETERIZATION  05/31/08   EF 55-60%, stable lesions, medical therapy  . COLECTOMY  '80's   polyps  . CORONARY ANGIOPLASTY WITH STENT PLACEMENT  01/19/08   PCI stent to mid LAD with Promus DES 27.5x28  . IR GENERIC HISTORICAL  01/03/2016   IR THORACENTESIS ASP PLEURAL SPACE W/IMG GUIDE 01/03/2016 [facility]-INTERV RAD  . KNEE ARTHROPLASTY Right 03/04/2018   Procedure: COMPUTER ASSISTED TOTAL KNEE ARTHROPLASTY;  Surgeon: Rod Can, MD;  Location: [facility];  Service: Orthopedics;  Laterality: Right;   . PTCA  01/2008       Family History  Problem Relation Age of Onset  . Diabetes Mother   . Heart attack Mother   . Leukemia Father   . Heart disease Sister   . Heart disease Brother   . Diabetes Brother        pancreatic cancer    Social History   Tobacco Use  . Smoking status: Former Smoker    Packs/day: 0.00    Years: 15.00    Pack years: 0.00    Types: Cigarettes    Quit date: 05/10/2017    Years since quitting: 3.3  . Smokeless tobacco: Never Used  . Tobacco comment: Patient smokes occasionally. Not everyday.  Vaping Use  . Vaping Use: Never used  Substance Use Topics  . Alcohol use: No  . Drug use: No    Home Medications Prior to Admission medications   Medication Sig Start Date End Date Taking? Authorizing Provider  sulfamethoxazole-trimethoprim (BACTRIM DS) 800-160 MG tablet Take 1 tablet by mouth 2 (two) times daily for 7 days. 09/13/20 09/20/20 Yes Luna Fuse, MD  amLODipine (NORVASC) 10 MG tablet TAKE 1 TABLET EVERY DAY 06/28/20   Lorretta Harp, MD  atorvastatin (LIPITOR) 10 MG tablet TAKE 1 TABLET (10 MG TOTAL) BY MOUTH DAILY. 05/15/20   Lorretta Harp, MD  Blood Glucose Monitoring Suppl (ACCU-CHEK AVIVA PLUS) w/Device KIT Test blood sugar 3 times daily. Dx code: E11.22 04/03/16   [name], MD  Blood Pressure Monitoring (BLOOD PRESS MONITOR/M-L CUFF) MISC 1 application by Does not apply route daily. 04/26/20   [name], MD  carvedilol (COREG) 25 MG tablet Take 1 tablet (25 mg total) by mouth 2 (two) times daily. 11/23/19 02/21/20  Lorretta Harp, MD  Continuous Blood Gluc Sensor ([place]) MISC 1 application by Does not apply route daily. 06/30/20   [name], MD  Continuous Glucose Monitor Sup KIT 1 kit by Does not apply route as directed. 04/26/20   [name], MD  ferrous sulfate 325 (65 FE) MG tablet Take 325 mg by mouth daily with breakfast.    [provider]  furosemide (LASIX) 40 MG  tablet Take 1 tablet (40 mg total) by mouth 2 (two) times daily. As needed for swelling 11/23/19   Lorretta Harp, MD  glucose blood (ACCU-CHEK AVIVA PLUS) test strip Test blood sugar 3 times daily. Dx code: E11.22 04/03/16   [name], MD  hydrALAZINE (APRESOLINE) 50 MG tablet  09/27/19   [provider]  insulin NPH-regular Human (NOVOLIN 70/30) (70-30) 100 UNIT/ML injection Inject 11 Units into the skin 2 (two) times daily with a meal. 06/30/20   [name], MD  Insulin Syringe-Needle U-100 (B-D INS SYR HALF-UNIT .3CC/31G) 31G X 5/16" 0.3 ML MISC Use daily at bedtime to inject insulin. Dx code: 250.00 06/21/13  [name], MD  Lancets (ACCU-CHEK MULTICLIX) lancets Test blood sugar 3 times daily. Dx code: E11.22 04/03/16   [name], MD  lisinopril (ZESTRIL) 20 MG tablet TAKE 1 TABLET (20 MG TOTAL) BY MOUTH DAILY. 05/31/20   Lorretta Harp, MD  mirabegron ER (MYRBETRIQ) 25 MG TB24 tablet Take 25 mg by mouth daily.    [provider]  Multiple Vitamin (MULTIVITAMIN) tablet Take 1 tablet by mouth daily. COMPLETE SENIOR VITAMINS AND M    [provider]  Omega-3 Fatty Acids (FISH OIL) 1000 MG CAPS Take 2 capsules by mouth daily.     [provider]  Vitamin D, Cholecalciferol, 1000 UNITS TABS Take 1 tablet by mouth daily.     [provider]    Allergies    Patient has no known allergies.  Review of Systems   Review of Systems  Constitutional: Negative for fever.  HENT: Negative for ear pain and sore throat.   Eyes: Negative for pain.  Respiratory: Negative for cough.   Cardiovascular: Negative for chest pain.  Gastrointestinal: Negative for abdominal pain.  Genitourinary: Negative for flank pain.  Musculoskeletal: Negative for back pain.  Skin: Negative for color change and rash.  Neurological: Negative for syncope.  All other systems reviewed and are negative.   Physical Exam Updated Vital Signs BP (!)  169/86   Pulse 74   Temp 97.8 F (36.6 C) (Oral)   Resp 14   SpO2 100%   Physical Exam Constitutional:      General: He is not in acute distress.    Appearance: He is well-developed.  HENT:     Head: Normocephalic.     Nose: Nose normal.  Eyes:     Extraocular Movements: Extraocular movements intact.  Cardiovascular:     Rate and Rhythm: Normal rate.  Pulmonary:     Effort: Pulmonary effort is normal.  Abdominal:     General: There is no distension.     Tenderness: There is no abdominal tenderness.  Skin:    Coloration: Skin is not jaundiced.  Neurological:     Mental Status: He is alert. Mental status is at baseline.     ED Results / Procedures / Treatments   Labs (all labs ordered are listed, but only abnormal results are displayed) Labs Reviewed  URINALYSIS, ROUTINE W REFLEX MICROSCOPIC - Abnormal; Notable for the following components:      Result Value   Color, Urine AMBER (*)    APPearance HAZY (*)    Glucose, UA 150 (*)    Hgb urine dipstick MODERATE (*)    Protein, ur 100 (*)    RBC / HPF >50 (*)    Bacteria, UA RARE (*)    Crystals PRESENT (*)    All other components within normal limits  BASIC METABOLIC PANEL - Abnormal; Notable for the following components:   Glucose, Bld 281 (*)    BUN 27 (*)    Creatinine, Ser 2.21 (*)    Calcium 8.6 (*)    GFR, Estimated 29 (*)    All other components within normal limits  CBC WITH DIFFERENTIAL/PLATELET - Abnormal; Notable for the following components:   Hemoglobin 12.5 (*)    All other components within normal limits    EKG None  Radiology ECHOCARDIOGRAM COMPLETE  Result Date: 09/12/2020    ECHOCARDIOGRAM REPORT   Patient Name:   LEQUAN DOBRATZ Date of Exam: 09/12/2020 Medical Rec #:  [id number]  Height:       72.0 in Accession #:    [id number]      Weight:       236.0 lb Date of Birth:  04/13/1933      BSA:          2.286 m Patient Age:    6 years        BP:           152/76 mmHg Patient Gender: M                HR:           74 bpm. Exam Location:  [facility] Procedure: 2D Echo, Cardiac Doppler and Color Doppler Indications:    I35.0 AS  History:        Patient has prior history of Echocardiogram examinations, most                 recent 09/08/2019. CAD, AS and Aortic Valve Disease; Risk                 Factors:Hypertension, Dyslipidemia and Diabetes.  Sonographer:    Coralyn Helling RDCS Referring Phys: [place]  1. Left ventricular ejection fraction, by estimation, is 60 to 65%. The left ventricle has normal function. The left ventricle has no regional wall motion abnormalities. There is moderate concentric left ventricular hypertrophy. Left ventricular diastolic parameters are consistent with Grade II diastolic dysfunction (pseudonormalization). Elevated left atrial pressure.  2. Right ventricular systolic function is normal. The right ventricular size is normal. Tricuspid regurgitation signal is inadequate for assessing PA pressure.  3. Left atrial size was mildly dilated.  4. The mitral valve is abnormal. No evidence of mitral valve regurgitation. No evidence of mitral stenosis. Moderate mitral annular calcification.  5. The aortic valve is tricuspid. There is severe calcifcation of the aortic valve. There is severe thickening of the aortic valve. Aortic valve regurgitation is moderate. Mild to moderate aortic valve stenosis. Aortic regurgitation PHT measures 431 msec. Aortic valve mean gradient measures 16.0 mmHg. Aortic valve Vmax measures 2.74 m/s.  6. The inferior vena cava is normal in size with greater than 50% respiratory variability, suggesting right atrial pressure of 3 mmHg. Comparison(s): A prior study was performed on 09/08/19. Prior images reviewed side by side. Stable Aortic gradients with slight increase in LV size. FINDINGS  Left Ventricle: Left ventricular ejection fraction, by estimation, is 60 to 65%. The left ventricle has normal function. The left ventricle has no  regional wall motion abnormalities. The left ventricular internal cavity size was normal in size. There is  moderate concentric left ventricular hypertrophy. Left ventricular diastolic parameters are consistent with Grade II diastolic dysfunction (pseudonormalization). Elevated left atrial pressure. Right Ventricle: The right ventricular size is normal. No increase in right ventricular wall thickness. Right ventricular systolic function is normal. Tricuspid regurgitation signal is inadequate for assessing PA pressure. The tricuspid regurgitant velocity is 3.00 m/s, and with an assumed right atrial pressure of 3 mmHg, the estimated right ventricular systolic pressure is 73.2 mmHg. Left Atrium: Left atrial size was mildly dilated. Right Atrium: Right atrial size was normal in size. Pericardium: There is no evidence of pericardial effusion. Mitral Valve: The mitral valve is abnormal. There is mild thickening of the mitral valve leaflet(s). There is mild calcification of the mitral valve leaflet(s). Moderate mitral annular calcification. No evidence of mitral valve regurgitation. No evidence  of mitral valve stenosis. Tricuspid Valve: The tricuspid valve is  normal in structure. Tricuspid valve regurgitation is trivial. No evidence of tricuspid stenosis. Aortic Valve: The aortic valve is tricuspid. There is severe calcifcation of the aortic valve. There is severe thickening of the aortic valve. Aortic valve regurgitation is moderate. Aortic regurgitation PHT measures 431 msec. Mild to moderate aortic stenosis is present. Aortic valve mean gradient measures 16.0 mmHg. Aortic valve peak gradient measures 30.0 mmHg. Aortic valve area, by VTI measures 1.95 cm. Pulmonic Valve: The pulmonic valve was grossly normal. Pulmonic valve regurgitation is not visualized. No evidence of pulmonic stenosis. Aorta: The aortic root and ascending aorta are structurally normal, with no evidence of dilitation based on age and BSA. Venous:  The inferior vena cava is normal in size with greater than 50% respiratory variability, suggesting right atrial pressure of 3 mmHg. IAS/Shunts: The atrial septum is grossly normal.  LEFT VENTRICLE PLAX 2D LVIDd:         5.30 cm  Diastology LVIDs:         3.70 cm  LV e' medial:    5.11 cm/s LV PW:         1.10 cm  LV E/e' medial:  24.9 LV IVS:        1.70 cm  LV e' lateral:   4.68 cm/s LVOT diam:     2.50 cm  LV E/e' lateral: 27.1 LV SV:         119 LV SV Index:   52 LVOT Area:     4.91 cm  RIGHT VENTRICLE             IVC RV S prime:     14.40 cm/s  IVC diam: 1.70 cm TAPSE (M-mode): 2.0 cm RVSP:           39.0 mmHg LEFT ATRIUM             Index       RIGHT ATRIUM           Index LA diam:        4.00 cm 1.75 cm/m  RA Pressure: 3.00 mmHg LA Vol (A2C):   74.3 ml 32.50 ml/m RA Area:     19.20 cm LA Vol (A4C):   93.4 ml 40.86 ml/m RA Volume:   46.80 ml  20.47 ml/m LA Biplane Vol: 83.1 ml 36.35 ml/m  AORTIC VALVE AV Area (Vmax):    1.81 cm AV Area (Vmean):   1.69 cm AV Area (VTI):     1.95 cm AV Vmax:           274.00 cm/s AV Vmean:          184.500 cm/s AV VTI:            0.610 m AV Peak Grad:      30.0 mmHg AV Mean Grad:      16.0 mmHg LVOT Vmax:         101.00 cm/s LVOT Vmean:        63.700 cm/s LVOT VTI:          0.242 m LVOT/AV VTI ratio: 0.40 AI PHT:            431 msec  AORTA Ao Root diam: 3.90 cm Ao Asc diam:  3.70 cm MV E velocity: 127.00 cm/s  TRICUSPID VALVE MV A velocity: 143.00 cm/s  TR Peak grad:   36.0 mmHg MV E/A ratio:  0.89         TR Vmax:        300.00  cm/s                             Estimated RAP:  3.00 mmHg                             RVSP:           39.0 mmHg                              SHUNTS                             Systemic VTI:  0.24 m                             Systemic Diam: 2.50 cm Rudean Haskell MD Electronically signed by Rudean Haskell MD Signature Date/Time: 09/12/2020/11:20:09 AM    Final     Procedures Procedures   Medications Ordered in ED Medications   lidocaine (XYLOCAINE) 2 % jelly 1 application (1 application Topical Given 09/13/20 1649)    ED Course  I have reviewed the triage vital signs and the nursing notes.  Pertinent labs & imaging results that were available during my care of the patient were reviewed by me and considered in my medical decision making (see chart for details).    MDM Rules/Calculators/A&P                          Patient has no difficulty urinating here there are some clots in reddish appearing urine.  Labs otherwise unremarkable white count normal hemoglobin normal chemistry unremarkable other than mild elevation of his creatinine.  Patient continues to be able to urinate while here, he had a catheter placed and additional clots were flushed out.  Subsequently no evidence of obstruction.  Will be discharged home.  Advise follow-up with urology within 2 to 3 days.  Advised immediate return for fevers chills difficulty urinating or any additional concerns.  Final Clinical Impression(s) / ED Diagnoses Final diagnoses:  Hematuria, unspecified type    Rx / DC Orders ED Discharge Orders         Ordered    sulfamethoxazole-trimethoprim (BACTRIM DS) 800-160 MG tablet  2 times daily        09/13/20 1701           Luna Fuse, MD 09/13/20 1701

## 2020-09-13 NOTE — ED Provider Notes (Signed)
Emergency Medicine Provider Triage Evaluation Note  [name] , a 87 y.o. male  was evaluated in triage.  Pt complains of gross hematuria for the past 3 days. Reports he has difficulty with his stream beginning however whenever he increases pressure in his pelvis to push it will help his stream but that is when he notices blood. He is not anticoagulated. Denies fevers, chills, dysuria, urinary frequency (on lasix however no worse than normal), abdominal pain, flank pain, nausea, vomiting, or any other associated symptoms.   Review of Systems  Positive: + hematuria Negative: - fevers, urinary frequency, dysuria  Physical Exam  BP (!) 156/82   Pulse 79   Temp 98.6 F (37 C)   Resp 16   SpO2 99%  Gen:   Awake, no distress   Resp:  Normal effort  MSK:   Moves extremities without difficulty  Other:    Medical Decision Making  Medically screening exam initiated at 9:42 AM.  Appropriate orders placed.  [name] was informed that the remainder of the evaluation will be completed by another provider, this initial triage assessment does not replace that evaluation, and the importance of remaining in the ED until their evaluation is complete.     Robert Maize, PA-C 09/13/20 1610    Robert Fuse, MD 09/13/20 [phone]

## 2020-09-18 DIAGNOSIS — R310 Gross hematuria: Secondary | ICD-10-CM | POA: Diagnosis not present

## 2020-09-18 DIAGNOSIS — C61 Malignant neoplasm of prostate: Secondary | ICD-10-CM | POA: Diagnosis not present

## 2020-09-21 DIAGNOSIS — E1165 Type 2 diabetes mellitus with hyperglycemia: Secondary | ICD-10-CM | POA: Diagnosis not present

## 2020-09-21 DIAGNOSIS — I10 Essential (primary) hypertension: Secondary | ICD-10-CM | POA: Diagnosis not present

## 2020-09-21 DIAGNOSIS — C61 Malignant neoplasm of prostate: Secondary | ICD-10-CM | POA: Diagnosis not present

## 2020-09-21 DIAGNOSIS — I129 Hypertensive chronic kidney disease with stage 1 through stage 4 chronic kidney disease, or unspecified chronic kidney disease: Secondary | ICD-10-CM | POA: Diagnosis not present

## 2020-09-21 DIAGNOSIS — N183 Chronic kidney disease, stage 3 unspecified: Secondary | ICD-10-CM | POA: Diagnosis not present

## 2020-09-21 DIAGNOSIS — E785 Hyperlipidemia, unspecified: Secondary | ICD-10-CM | POA: Diagnosis not present

## 2020-09-21 DIAGNOSIS — E1122 Type 2 diabetes mellitus with diabetic chronic kidney disease: Secondary | ICD-10-CM | POA: Diagnosis not present

## 2020-10-02 ENCOUNTER — Ambulatory Visit: Payer: Medicare HMO | Admitting: Family Medicine

## 2020-10-03 DIAGNOSIS — N400 Enlarged prostate without lower urinary tract symptoms: Secondary | ICD-10-CM | POA: Diagnosis not present

## 2020-10-03 DIAGNOSIS — R310 Gross hematuria: Secondary | ICD-10-CM | POA: Diagnosis not present

## 2020-10-09 ENCOUNTER — Other Ambulatory Visit: Payer: Self-pay | Admitting: Cardiovascular Disease

## 2020-10-09 DIAGNOSIS — I10 Essential (primary) hypertension: Secondary | ICD-10-CM

## 2020-11-07 ENCOUNTER — Other Ambulatory Visit (HOSPITAL_COMMUNITY): Payer: Self-pay

## 2020-11-07 ENCOUNTER — Ambulatory Visit (HOSPITAL_COMMUNITY)
Admission: RE | Admit: 2020-11-07 | Discharge: 2020-11-07 | Disposition: A | Discharge status: Home / Self Care | Payer: Medicare HMO | Source: Ambulatory Visit | Attending: Internal Medicine | Admitting: Internal Medicine

## 2020-11-07 ENCOUNTER — Other Ambulatory Visit: Payer: Self-pay

## 2020-11-07 ENCOUNTER — Encounter (HOSPITAL_BASED_OUTPATIENT_CLINIC_OR_DEPARTMENT_OTHER): Payer: Medicare HMO | Attending: Internal Medicine | Admitting: Internal Medicine

## 2020-11-07 DIAGNOSIS — N3041 Irradiation cystitis with hematuria: Secondary | ICD-10-CM

## 2020-11-07 DIAGNOSIS — R310 Gross hematuria: Secondary | ICD-10-CM | POA: Diagnosis not present

## 2020-11-07 DIAGNOSIS — C679 Malignant neoplasm of bladder, unspecified: Secondary | ICD-10-CM | POA: Diagnosis not present

## 2020-11-07 DIAGNOSIS — E119 Type 2 diabetes mellitus without complications: Secondary | ICD-10-CM

## 2020-11-07 DIAGNOSIS — C61 Malignant neoplasm of prostate: Secondary | ICD-10-CM

## 2020-11-07 NOTE — Progress Notes (Signed)
Sipos, Steed L. ([id number]) Visit Report for 11/07/2020 Abuse/Suicide Risk Screen Details Patient Name: Date of Service: [name], [name] 11/07/2020 1:15 PM Medical Record Number: [id number] Patient Account Number: [id number] Date of Birth/Sex: Treating RN: July 02, 1933 (87 y.o. [name] Primary Care Chanae Gemma: [name] Other Clinician: Referring Tameeka Luo: Treating Viviane Semidey/Extender: [name] in Treatment: 0 Abuse/Suicide Risk Screen Items Answer ABUSE RISK SCREEN: Has anyone close to you tried to hurt or harm you recentlyo No Do you feel uncomfortable with anyone in your familyo No Has anyone forced you do things that you didnt want to doo No Electronic Signature(s) Signed: 11/07/2020 5:31:21 PM By: Baruch Gouty RN, BSN Entered By: Baruch Gouty on 11/07/2020 14:09:17 -------------------------------------------------------------------------------- Activities of Daily Living Details Patient Name: Date of Service: [name], [name] 11/07/2020 1:15 PM Medical Record Number: [id number] Patient Account Number: [id number] Date of Birth/Sex: Treating RN: 09-09-1933 (87 y.o. [name] Primary Care Nagi Furio: [name] Other Clinician: Referring Kelse Ploch: Treating Dreamer Carillo/Extender: [name] in Treatment: 0 Activities of Daily Living Items Answer Activities of Daily Living (Please select one for each item) Drive Automobile Completely Able T Medications ake Completely Able Use T elephone Completely Able Care for Appearance Completely Able Use T oilet Completely Able Bath / Shower Completely Able Dress Self Completely Able Feed Self Completely Able Walk Completely Able Get In / Out Bed Completely Able Housework Completely Able Prepare Meals Completely Able Handle Money Completely Able Shop for Self Completely Able Electronic Signature(s) Signed: 11/07/2020 5:31:21 PM By: Baruch Gouty RN,  BSN Entered By: Baruch Gouty on 11/07/2020 14:09:39 -------------------------------------------------------------------------------- Education Screening Details Patient Name: Date of Service: [name], [name] 11/07/2020 1:15 PM Medical Record Number: [id number] Patient Account Number: [id number] Date of Birth/Sex: Treating RN: 1933-09-19 (87 y.o. [name] Primary Care Shaddai Shapley: [name] Other Clinician: Referring Amberli Ruegg: Treating Petros Ahart/Extender: [name] in Treatment: 0 Primary Learner Assessed: Patient Learning Preferences/Education Level/Primary Language Learning Preference: Explanation, Demonstration Highest Education Level: High School Preferred Language: English Cognitive Barrier Language Barrier: No Translator Needed: No Memory Deficit: No Emotional Barrier: No Cultural/Religious Beliefs Affecting Medical Care: No Physical Barrier Impaired Vision: Yes Glasses Impaired Hearing: No Decreased Hand dexterity: No Knowledge/Comprehension Knowledge Level: High Comprehension Level: High Ability to understand written instructions: High Ability to understand verbal instructions: High Motivation Anxiety Level: Calm Cooperation: Cooperative Education Importance: Acknowledges Need Interest in Health Problems: Asks Questions Perception: Coherent Willingness to Engage in Self-Management High Activities: Readiness to Engage in Self-Management High Activities: Electronic Signature(s) Signed: 11/07/2020 5:31:21 PM By: Baruch Gouty RN, BSN Entered By: Baruch Gouty on 11/07/2020 14:10:07 -------------------------------------------------------------------------------- Fall Risk Assessment Details Patient Name: Date of Service: [name], [name] 11/07/2020 1:15 PM Medical Record Number: [id number] Patient Account Number: [id number] Date of Birth/Sex: Treating RN: [birth year] (86 y.o. [name] Primary Care  Zuriel Roskos: [name] Other Clinician: Referring Damarie Schoolfield: Treating Caylen Kuwahara/Extender: [name] in Treatment: 0 Fall Risk Assessment Items Have you had 2 or more falls in the last 12 monthso 0 No Have you had any fall that resulted in injury in the last 12 monthso 0 No FALLS RISK SCREEN History of falling - immediate or within 3 months 0 No Secondary diagnosis (Do you have 2 or more medical diagnoseso) 0 No Ambulatory aid None/bed rest/wheelchair/nurse 0 Yes Crutches/cane/walker 0 No Furniture 0 No Intravenous therapy Access/Saline/Heparin Lock 0 No Gait/Transferring Normal/ bed rest/ wheelchair 0 Yes Weak (short steps with or without shuffle, stooped but  able to lift head while walking, may seek 0 No support from furniture) Impaired (short steps with shuffle, may have difficulty arising from chair, head down, impaired 0 No balance) Mental Status Oriented to own ability 0 Yes Electronic Signature(s) Signed: 11/07/2020 5:31:21 PM By: Baruch Gouty RN, BSN Entered By: Baruch Gouty on 11/07/2020 14:10:18 -------------------------------------------------------------------------------- Foot Assessment Details Patient Name: Date of Service: [name], [name] 11/07/2020 1:15 PM Medical Record Number: [id number] Patient Account Number: [id number] Date of Birth/Sex: Treating RN: 1933-11-15 (87 y.o. [name] Primary Care Ayline Dingus: [name] Other Clinician: Referring Birdella Sippel: Treating Maven Rosander/Extender: [name] in Treatment: 0 Foot Assessment Items Site Locations + = Sensation present, - = Sensation absent, C = Callus, U = Ulcer R = Redness, W = Warmth, M = Maceration, PU = Pre-ulcerative lesion F = Fissure, S = Swelling, D = Dryness Assessment Right: Left: Other Deformity: No No Prior Foot Ulcer: No No Prior Amputation: No No Charcot Joint: No No Ambulatory Status: Ambulatory Without  Help Gait: Steady Electronic Signature(s) Signed: 11/07/2020 5:31:21 PM By: Baruch Gouty RN, BSN Entered By: Baruch Gouty on 11/07/2020 14:10:52 -------------------------------------------------------------------------------- Nutrition Risk Screening Details Patient Name: Date of Service: [name], [name] 11/07/2020 1:15 PM Medical Record Number: [id number] Patient Account Number: [id number] Date of Birth/Sex: Treating RN: Sep 03, 1933 (87 y.o. [name] Primary Care Jasier Calabretta: [name] Other Clinician: Referring Adie Vilar: Treating Elsie Sakuma/Extender: [name] in Treatment: 0 Height (in): 72 Weight (lbs): 236 Body Mass Index (BMI): 32 Nutrition Risk Screening Items Score Screening NUTRITION RISK SCREEN: I have an illness or condition that made me change the kind and/or amount of food I eat 0 No I eat fewer than two meals per day 0 No I eat few fruits and vegetables, or milk products 0 No I have three or more drinks of beer, liquor or wine almost every day 0 No I have tooth or mouth problems that make it hard for me to eat 0 No I don't always have enough money to buy the food I need 0 No I eat alone most of the time 0 No I take three or more different prescribed or over-the-counter drugs a day 1 Yes Without wanting to, I have lost or gained 10 pounds in the last six months 0 No I am not always physically able to shop, cook and/or feed myself 0 No Nutrition Protocols Good Risk Protocol 0 No interventions needed Moderate Risk Protocol High Risk Proctocol Risk Level: Good Risk Score: 1 Electronic Signature(s) Signed: 11/07/2020 5:31:21 PM By: Baruch Gouty RN, BSN Entered By: Baruch Gouty on 11/07/2020 14:10:43

## 2020-11-08 ENCOUNTER — Other Ambulatory Visit: Payer: Self-pay

## 2020-11-08 ENCOUNTER — Inpatient Hospital Stay (HOSPITAL_COMMUNITY)
Admission: EM | Admit: 2020-11-08 | Discharge: 2020-11-12 | DRG: 669 | Disposition: A | Discharge status: Home / Self Care | Payer: Medicare HMO | Attending: Internal Medicine | Admitting: Internal Medicine

## 2020-11-08 ENCOUNTER — Encounter (HOSPITAL_COMMUNITY): Payer: Self-pay

## 2020-11-08 DIAGNOSIS — C679 Malignant neoplasm of bladder, unspecified: Secondary | ICD-10-CM | POA: Diagnosis present

## 2020-11-08 DIAGNOSIS — I10 Essential (primary) hypertension: Secondary | ICD-10-CM | POA: Diagnosis present

## 2020-11-08 DIAGNOSIS — Z806 Family history of leukemia: Secondary | ICD-10-CM | POA: Diagnosis not present

## 2020-11-08 DIAGNOSIS — D62 Acute posthemorrhagic anemia: Secondary | ICD-10-CM | POA: Diagnosis present

## 2020-11-08 DIAGNOSIS — I2510 Atherosclerotic heart disease of native coronary artery without angina pectoris: Secondary | ICD-10-CM | POA: Diagnosis present

## 2020-11-08 DIAGNOSIS — Z923 Personal history of irradiation: Secondary | ICD-10-CM | POA: Diagnosis not present

## 2020-11-08 DIAGNOSIS — Z955 Presence of coronary angioplasty implant and graft: Secondary | ICD-10-CM

## 2020-11-08 DIAGNOSIS — Y842 Radiological procedure and radiotherapy as the cause of abnormal reaction of the patient, or of later complication, without mention of misadventure at the time of the procedure: Secondary | ICD-10-CM | POA: Diagnosis present

## 2020-11-08 DIAGNOSIS — E785 Hyperlipidemia, unspecified: Secondary | ICD-10-CM | POA: Diagnosis present

## 2020-11-08 DIAGNOSIS — Z8249 Family history of ischemic heart disease and other diseases of the circulatory system: Secondary | ICD-10-CM

## 2020-11-08 DIAGNOSIS — D649 Anemia, unspecified: Secondary | ICD-10-CM

## 2020-11-08 DIAGNOSIS — R310 Gross hematuria: Secondary | ICD-10-CM | POA: Diagnosis present

## 2020-11-08 DIAGNOSIS — Z96651 Presence of right artificial knee joint: Secondary | ICD-10-CM | POA: Diagnosis present

## 2020-11-08 DIAGNOSIS — Z800 Family history of malignant neoplasm of digestive organs: Secondary | ICD-10-CM | POA: Diagnosis not present

## 2020-11-08 DIAGNOSIS — Z8546 Personal history of malignant neoplasm of prostate: Secondary | ICD-10-CM | POA: Diagnosis not present

## 2020-11-08 DIAGNOSIS — E1169 Type 2 diabetes mellitus with other specified complication: Secondary | ICD-10-CM

## 2020-11-08 DIAGNOSIS — E669 Obesity, unspecified: Secondary | ICD-10-CM | POA: Diagnosis present

## 2020-11-08 DIAGNOSIS — N179 Acute kidney failure, unspecified: Secondary | ICD-10-CM | POA: Diagnosis present

## 2020-11-08 DIAGNOSIS — Z87891 Personal history of nicotine dependence: Secondary | ICD-10-CM

## 2020-11-08 DIAGNOSIS — N3040 Irradiation cystitis without hematuria: Secondary | ICD-10-CM

## 2020-11-08 DIAGNOSIS — E1165 Type 2 diabetes mellitus with hyperglycemia: Secondary | ICD-10-CM | POA: Diagnosis present

## 2020-11-08 DIAGNOSIS — Z6831 Body mass index (BMI) 31.0-31.9, adult: Secondary | ICD-10-CM

## 2020-11-08 DIAGNOSIS — Z833 Family history of diabetes mellitus: Secondary | ICD-10-CM

## 2020-11-08 DIAGNOSIS — Z794 Long term (current) use of insulin: Secondary | ICD-10-CM

## 2020-11-08 DIAGNOSIS — N320 Bladder-neck obstruction: Secondary | ICD-10-CM | POA: Diagnosis present

## 2020-11-08 DIAGNOSIS — Z79899 Other long term (current) drug therapy: Secondary | ICD-10-CM

## 2020-11-08 DIAGNOSIS — M1990 Unspecified osteoarthritis, unspecified site: Secondary | ICD-10-CM | POA: Diagnosis present

## 2020-11-08 DIAGNOSIS — E1122 Type 2 diabetes mellitus with diabetic chronic kidney disease: Secondary | ICD-10-CM | POA: Diagnosis present

## 2020-11-08 DIAGNOSIS — I129 Hypertensive chronic kidney disease with stage 1 through stage 4 chronic kidney disease, or unspecified chronic kidney disease: Secondary | ICD-10-CM | POA: Diagnosis present

## 2020-11-08 DIAGNOSIS — N3041 Irradiation cystitis with hematuria: Secondary | ICD-10-CM | POA: Diagnosis present

## 2020-11-08 DIAGNOSIS — N184 Chronic kidney disease, stage 4 (severe): Secondary | ICD-10-CM

## 2020-11-08 DIAGNOSIS — Z20822 Contact with and (suspected) exposure to covid-19: Secondary | ICD-10-CM | POA: Diagnosis present

## 2020-11-08 DIAGNOSIS — R319 Hematuria, unspecified: Secondary | ICD-10-CM | POA: Diagnosis present

## 2020-11-08 DIAGNOSIS — Z9861 Coronary angioplasty status: Secondary | ICD-10-CM

## 2020-11-08 DIAGNOSIS — N1832 Chronic kidney disease, stage 3b: Secondary | ICD-10-CM | POA: Diagnosis not present

## 2020-11-08 DIAGNOSIS — E119 Type 2 diabetes mellitus without complications: Secondary | ICD-10-CM

## 2020-11-08 LAB — CBC
HCT: 23.2 % — ABNORMAL LOW (ref 39.0–52.0)
Hemoglobin: 7.1 g/dL — ABNORMAL LOW (ref 13.0–17.0)
MCH: 27.3 pg (ref 26.0–34.0)
MCHC: 30.6 g/dL (ref 30.0–36.0)
MCV: 89.2 fL (ref 80.0–100.0)
Platelets: 233 10*3/uL (ref 150–400)
RBC: 2.6 MIL/uL — ABNORMAL LOW (ref 4.22–5.81)
RDW: 14.9 % (ref 11.5–15.5)
WBC: 7.4 10*3/uL (ref 4.0–10.5)
nRBC: 0 % (ref 0.0–0.2)

## 2020-11-08 LAB — HEMOGLOBIN AND HEMATOCRIT, BLOOD
HCT: 26.2 % — ABNORMAL LOW (ref 39.0–52.0)
Hemoglobin: 8.5 g/dL — ABNORMAL LOW (ref 13.0–17.0)

## 2020-11-08 LAB — BASIC METABOLIC PANEL
Anion gap: 10 (ref 5–15)
BUN: 32 mg/dL — ABNORMAL HIGH (ref 8–23)
CO2: 22 mmol/L (ref 22–32)
Calcium: 8.4 mg/dL — ABNORMAL LOW (ref 8.9–10.3)
Chloride: 106 mmol/L (ref 98–111)
Creatinine, Ser: 2.56 mg/dL — ABNORMAL HIGH (ref 0.61–1.24)
GFR, Estimated: 24 mL/min — ABNORMAL LOW (ref >60)
Glucose, Bld: 141 mg/dL — ABNORMAL HIGH (ref 70–99)
Potassium: 4.1 mmol/L (ref 3.5–5.1)
Sodium: 138 mmol/L (ref 135–145)

## 2020-11-08 LAB — URINALYSIS, ROUTINE W REFLEX MICROSCOPIC
Bilirubin Urine: NEGATIVE
Glucose, UA: 50 mg/dL — AB
Ketones, ur: NEGATIVE mg/dL
Leukocytes,Ua: NEGATIVE
Nitrite: NEGATIVE
Protein, ur: 100 mg/dL — AB
RBC / HPF: >50 RBC/hpf — ABNORMAL HIGH (ref 0–5)
Specific Gravity, Urine: 1.01 (ref 1.005–1.030)
WBC, UA: >50 WBC/hpf — ABNORMAL HIGH (ref 0–5)
pH: 6 (ref 5.0–8.0)

## 2020-11-08 LAB — CBG MONITORING, ED
Glucose-Capillary: 117 mg/dL — ABNORMAL HIGH (ref 70–99)
Glucose-Capillary: 108 mg/dL — ABNORMAL HIGH (ref 70–99)
Glucose-Capillary: 143 mg/dL — ABNORMAL HIGH (ref 70–99)

## 2020-11-08 LAB — RESP PANEL BY RT-PCR (FLU A&B, COVID) ARPGX2
Influenza A by PCR: NEGATIVE
Influenza B by PCR: NEGATIVE
SARS Coronavirus 2 by RT PCR: NEGATIVE

## 2020-11-08 LAB — HEMOGLOBIN A1C
Hgb A1c MFr Bld: 7.5 % — ABNORMAL HIGH (ref 4.8–5.6)
Mean Plasma Glucose: 168.55 mg/dL

## 2020-11-08 LAB — PREPARE RBC (CROSSMATCH)

## 2020-11-08 MED ORDER — ATORVASTATIN CALCIUM 10 MG PO TABS
10.0000 mg | ORAL_TABLET | Freq: Every day | ORAL | Status: DC
  Administered 2020-11-09 – 2020-11-12 (×3): 10 mg via ORAL
  Filled 2020-11-08 (×3): qty 1

## 2020-11-08 MED ORDER — INSULIN ASPART 100 UNIT/ML IJ SOLN
0.0000 [IU] | Freq: Every day | INTRAMUSCULAR | Status: DC
  Filled 2020-11-08: qty 0.05

## 2020-11-08 MED ORDER — CARVEDILOL 25 MG PO TABS
25.0000 mg | ORAL_TABLET | Freq: Two times a day (BID) | ORAL | Status: DC
  Administered 2020-11-08 – 2020-11-12 (×8): 25 mg via ORAL
  Filled 2020-11-08 (×4): qty 1
  Filled 2020-11-08: qty 2
  Filled 2020-11-08: qty 1
  Filled 2020-11-08: qty 2
  Filled 2020-11-08: qty 1

## 2020-11-08 MED ORDER — AMLODIPINE BESYLATE 10 MG PO TABS
10.0000 mg | ORAL_TABLET | Freq: Every day | ORAL | Status: DC
  Administered 2020-11-09 – 2020-11-12 (×4): 10 mg via ORAL
  Filled 2020-11-08 (×3): qty 1
  Filled 2020-11-08: qty 2

## 2020-11-08 MED ORDER — ACETAMINOPHEN 650 MG RE SUPP: 650.0000 mg | Freq: Four times a day (QID) | RECTAL | Status: DC | PRN

## 2020-11-08 MED ORDER — INSULIN ASPART 100 UNIT/ML IJ SOLN
0.0000 [IU] | Freq: Three times a day (TID) | INTRAMUSCULAR | Status: DC
  Administered 2020-11-09: 5 [IU] via SUBCUTANEOUS
  Administered 2020-11-09: 2 [IU] via SUBCUTANEOUS
  Administered 2020-11-10 – 2020-11-11 (×3): 3 [IU] via SUBCUTANEOUS
  Administered 2020-11-11: 2 [IU] via SUBCUTANEOUS
  Administered 2020-11-11 – 2020-11-12 (×2): 3 [IU] via SUBCUTANEOUS
  Administered 2020-11-12: 2 [IU] via SUBCUTANEOUS
  Filled 2020-11-08: qty 0.15

## 2020-11-08 MED ORDER — ACETAMINOPHEN 325 MG PO TABS
650.0000 mg | ORAL_TABLET | Freq: Four times a day (QID) | ORAL | Status: DC | PRN
  Administered 2020-11-09 – 2020-11-12 (×2): 650 mg via ORAL
  Filled 2020-11-08 (×2): qty 2

## 2020-11-08 MED ORDER — SODIUM CHLORIDE 0.9 % IR SOLN
3000.0000 mL | Status: DC
  Administered 2020-11-08: 3000 mL

## 2020-11-08 MED ORDER — FUROSEMIDE 40 MG PO TABS
40.0000 mg | ORAL_TABLET | Freq: Two times a day (BID) | ORAL | Status: DC
  Administered 2020-11-09 – 2020-11-12 (×5): 40 mg via ORAL
  Filled 2020-11-08 (×6): qty 1

## 2020-11-08 MED ORDER — SODIUM CHLORIDE 0.9 % IV SOLN: 10.0000 mL/h | Freq: Once | INTRAVENOUS | Status: DC

## 2020-11-08 NOTE — ED Provider Notes (Signed)
Emergency Medicine Provider Triage Evaluation Note  [name] , a 87 y.o. male  was evaluated in triage.  Pt complains of hematuria for 7 weeks. Denies hx anticoagulation.  Review of Systems  Positive: Hematuria, dysuria, weakness, doe Negative: fevers  Physical Exam  BP (!) 143/75 (BP Location: Left Arm)   Pulse 89   Temp 98 F (36.7 C) (Oral)   Resp 18   SpO2 100%  Gen:   Awake, no distress   Resp:  Normal effort  MSK:   Moves extremities without difficulty    Medical Decision Making  Medically screening exam initiated at 12:21 PM.  Appropriate orders placed.  [name] was informed that the remainder of the evaluation will be completed by another provider, this initial triage assessment does not replace that evaluation, and the importance of remaining in the ED until their evaluation is complete.     Robert Booze, PA-C 11/08/20 1222    Robert Lemming, MD 11/08/20 1429

## 2020-11-08 NOTE — ED Triage Notes (Signed)
Pt reports hematuria from 6-7 weeks and now endorses generalized weakness, fatigue, and SHOB.

## 2020-11-08 NOTE — Progress Notes (Signed)
Did pt's nursing admission history. Pt's wife walking around room taking pictures with her phone after I completed history. She has phone aimed low taking pictures. Informed pt's rn, Sharrell Ku RN.  Lucius Conn BSN, RN-BC Admissions RN 11/08/2020 3:34 PM

## 2020-11-08 NOTE — ED Notes (Signed)
1,052mL's strawberry colored urine emptied from the pt's foley catheter bag. Continuing CBI.

## 2020-11-08 NOTE — ED Notes (Signed)
Provider at the bedside.  

## 2020-11-08 NOTE — H&P (Signed)
History and Physical    RYNELL CIOTTI AJG:[id number] DOB: Oct 29, 1933 DOA: 11/08/2020  PCP: Robyne Peers, MD  Patient coming from: Home  Chief Complaint: hematuria  HPI: MCCAULEY DIEHL is a 87 y.o. male with medical history significant of prostate cancer, DM2, HTN. Presenting with hematuria and weakness. He reports that he has known hematuria. He follows w/ urology and had recent evlauation. He reports that over the last 2 weeks, he's had hematuria w/ every void and it seems to be worsening. He now finds himself weak, dizzy and short of breath during this time.He hasn't had any chest pain. He has not passed out. His family finally convinced him to come to the ED for evaluation today. He denies any other aggravating or alleviating factors.    ED Course: His Hgb was found to have dropped to 7.1 from 12.5 on 09/13/20. His urine was found to be bloody. Urology was consulted. [facility] was called for admission.    Review of Systems:  Denies CP, ab pain, pelvic pain, N/V/D, fevers, syncopal episodes.  Review of systems is otherwise negative for all not mentioned in HPI.   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   ED (erectile dysfunction)    Elevated serum creatinine 12/14/2015   Essential hypertension, benign    Hematuria 12/01/2013   Hyperlipidemia    Ileus ([place]) 12/14/2015   Insulin dependent type 2 diabetes mellitus, controlled ([place])    Malignant neoplasm of prostate ([place]) 11/26/2013   Multiple rib fractures    left 9th, 10th, and 11th posterior rib fractures S/P fall 12/09/2015/notes  12/12/2015   Osteoarthritis of right knee 03/04/2018   PREMATURE VENTRICULAR CONTRACTIONS 05/08/2008   Qualifier: Diagnosis of  By: Rayann Heman, MD, James     Prostate cancer [facility])    s/p   Rectal bleeding 12/23/2012   Evaluated by Dr Benson [place] GI patient with history of radiation proctitis. Patient due for colonoscopy on 04/2014    Type II or unspecified type diabetes mellitus without mention of complication, not stated as uncontrolled    Ventricular tachycardia ([place])    resuscitated; monitor 05/2008; attempted T ablation 2/10- aborted due to inappropriate substrate    PSHx Past Surgical History:  Procedure Laterality Date   CARDIAC CATHETERIZATION  05/31/08   EF 55-60%, stable lesions, medical therapy   COLECTOMY  '80's   polyps   CORONARY ANGIOPLASTY WITH STENT PLACEMENT  01/19/08   PCI stent to mid LAD with Promus DES 27.5x28   IR GENERIC HISTORICAL  01/03/2016   IR THORACENTESIS ASP PLEURAL SPACE W/IMG GUIDE 01/03/2016 [facility]-INTERV RAD   KNEE ARTHROPLASTY Right 03/04/2018   Procedure: COMPUTER ASSISTED TOTAL KNEE ARTHROPLASTY;  Surgeon: Rod Can, MD;  Location: [facility] ORS;  Service: Orthopedics;  Laterality: Right;   PTCA  01/2008    SocHx  reports that he quit smoking about 3 years ago. His smoking use included cigarettes. He has never used smokeless tobacco. He reports that he does not drink alcohol and does not use drugs.  No Known Allergies  FamHx Family History  Problem Relation Age of Onset   Diabetes Mother    Heart attack Mother    Leukemia Father    Heart disease Sister    Heart disease Brother    Diabetes Brother        pancreatic cancer    Prior to Admission medications   Medication Sig Start Date End Date Taking? Authorizing Provider  amLODipine (NORVASC) 10 MG tablet TAKE 1 TABLET EVERY DAY 10/09/20   Lorretta Harp, MD  atorvastatin (LIPITOR) 10 MG tablet TAKE 1 TABLET (10 MG TOTAL) BY MOUTH DAILY. 05/15/20   Lorretta Harp, MD  Blood Glucose Monitoring Suppl  (ACCU-CHEK AVIVA PLUS) w/Device KIT Test blood sugar 3 times daily. Dx code: E11.22 04/03/16   [name], MD  Blood Pressure Monitoring (BLOOD PRESS MONITOR/M-L CUFF) MISC 1 application by Does not apply route daily. 04/26/20   [name], MD  carvedilol (COREG) 25 MG tablet Take 1 tablet (25 mg total) by mouth 2 (two) times daily. 11/23/19 02/21/20  Lorretta Harp, MD  Continuous Blood Gluc Sensor ([place]) MISC 1 application by Does not apply route daily. 06/30/20   [name], MD  Continuous Glucose Monitor Sup KIT 1 kit by Does not apply route as directed. 04/26/20   [name], MD  ferrous sulfate 325 (65 FE) MG tablet Take 325 mg by mouth daily with breakfast.    [provider]  furosemide (LASIX) 40 MG tablet Take 1 tablet (40 mg total) by mouth 2 (two) times daily. As needed for swelling 11/23/19   Lorretta Harp, MD  glucose blood (ACCU-CHEK AVIVA PLUS) test strip Test blood sugar 3 times daily. Dx code: E11.22 04/03/16   [name], MD  hydrALAZINE (APRESOLINE) 50 MG tablet  09/27/19   [provider]  insulin NPH-regular Human (NOVOLIN 70/30) (70-30) 100 UNIT/ML injection Inject 11 Units into the skin 2 (two) times daily with a meal. 06/30/20   [name], MD  Insulin Syringe-Needle U-100 (B-D INS SYR HALF-UNIT .3CC/31G) 31G X 5/16" 0.3 ML MISC Use daily at bedtime to inject insulin. Dx code: 250.00 06/21/13   [name], MD  Lancets (ACCU-CHEK MULTICLIX) lancets Test blood sugar 3 times daily. Dx code: E11.22 04/03/16   [name], MD  lisinopril (ZESTRIL) 20 MG tablet TAKE 1 TABLET (20 MG TOTAL) BY MOUTH DAILY. 05/31/20   Lorretta Harp, MD  mirabegron ER (MYRBETRIQ) 25 MG TB24 tablet Take 25 mg by mouth daily.    [provider]  Multiple Vitamin (MULTIVITAMIN) tablet Take 1 tablet by mouth daily. COMPLETE SENIOR VITAMINS AND M    [provider]  Omega-3 Fatty Acids (FISH  OIL) 1000 MG CAPS Take 2 capsules by mouth daily.     [provider]  Vitamin D, Cholecalciferol, 1000 UNITS TABS Take 1 tablet by mouth daily.     [provider]    Physical Exam: Vitals:   11/08/20 1042 11/08/20 1343 11/08/20 1415  BP: (!) 143/75 139/65 138/61  Pulse: 89 67 69  Resp: _0 Temp: 98 F (36.7 C)    TempSrc: Oral    SpO2: 100% 100% 100%    General: 87 y.o. male resting in bed in NAD Eyes: PERRL, normal sclera ENMT: Nares patent w/o discharge, orophaynx clear, dentition normal, ears w/o discharge/lesions/ulcers Neck: Supple, trachea midline Cardiovascular: RRR, +S1, S2, no m/g/r, equal pulses throughout Respiratory: CTABL,  no w/r/r, normal WOB GI: BS+, NDNT, no masses noted, no organomegaly noted MSK: No e/c/c Skin: No rashes, bruises, ulcerations noted Neuro: A&O x 3, no focal deficits Psyc: Appropriate interaction and affect, calm/cooperative  Labs on Admission: I have personally reviewed following labs and imaging studies  CBC: Recent Labs  Lab 11/08/20 1204  WBC 7.4  HGB 7.1*  HCT 23.2*  MCV 89.2  PLT 938   Basic Metabolic Panel: Recent Labs  Lab 11/08/20 1204  NA 138  K 4.1  CL 106  CO2 22  GLUCOSE 141*  BUN 32*  CREATININE 2.56*  CALCIUM 8.4*   GFR: CrCl cannot be calculated (Unknown ideal weight.). Liver Function Tests: No results for input(s): AST, ALT, ALKPHOS, BILITOT, PROT, ALBUMIN in the last 168 hours. No results for input(s): LIPASE, AMYLASE in the last 168 hours. No results for input(s): AMMONIA in the last 168 hours. Coagulation Profile: No results for input(s): INR, PROTIME in the last 168 hours. Cardiac Enzymes: No results for input(s): CKTOTAL, CKMB, CKMBINDEX, TROPONINI in the last 168 hours. BNP (last 3 results) No results for input(s): PROBNP in the last 8760 hours. HbA1C: No results for input(s): HGBA1C in the last 72 hours. CBG: Recent Labs  Lab 11/08/20 1351  GLUCAP 117*   Lipid  Profile: No results for input(s): CHOL, HDL, LDLCALC, TRIG, CHOLHDL, LDLDIRECT in the last 72 hours. Thyroid Function Tests: No results for input(s): TSH, T4TOTAL, FREET4, T3FREE, THYROIDAB in the last 72 hours. Anemia Panel: No results for input(s): VITAMINB12, FOLATE, FERRITIN, TIBC, IRON, RETICCTPCT in the last 72 hours. Urine analysis:    Component Value Date/Time   COLORURINE RED (A) 11/08/2020 1136   APPEARANCEUR CLOUDY (A) 11/08/2020 1136   LABSPEC 1.010 11/08/2020 1136   PHURINE 6.0 11/08/2020 1136   GLUCOSEU 50 (A) 11/08/2020 1136   HGBUR MODERATE (A) 11/08/2020 1136   BILIRUBINUR NEGATIVE 11/08/2020 1136   BILIRUBINUR negative 06/11/2018 1511   BILIRUBINUR neg 06/27/2014 1520   KETONESUR NEGATIVE 11/08/2020 1136   PROTEINUR 100 (A) 11/08/2020 1136   UROBILINOGEN 0.2 06/11/2018 1511   NITRITE NEGATIVE 11/08/2020 1136   LEUKOCYTESUR NEGATIVE 11/08/2020 1136    Radiological Exams on Admission: DG Chest 2 View  Result Date: 11/08/2020 CLINICAL DATA:  Irradiation cystitis. Hematuria. History of prostate cancer. History of smoking. History of CHF. History of diabetes. EXAM: CHEST - 2 VIEW COMPARISON:  Chest x-ray 08/06/2019, 10/19/2018. FINDINGS: Mediastinum and hilar structures normal. Stable cardiomegaly. No pulmonary venous congestion. Chronic interstitial changes again noted. Stable left base pleuroparenchymal thickening consistent with scarring. Degenerative change thoracic spine. Surgical clips upper abdomen. IMPRESSION: 1.  Stable cardiomegaly. 2. Stable chronic interstitial changes. Left base pleural-parenchymal thickening consistent with scarring again noted. No acute abnormality identified. Electronically Signed   By: Marcello Moores  Register   On: 11/08/2020 06:52    EKG: Independently reviewed. Sinus, no st elevations  Assessment/Plan Hematuria Symptomatic anemia     - admit to inpt, tele     - urology consulted, CBI ordered, appreciate assistance     - 2 units pRBCs  ordered; check q6h H&H  HTN     - continue home regimen as tolerated  CKD stage 4     - his slightly above baseline; he will get 2 units pRBCs     - rpt his labs in AM   HLD     - continue home statin  DM2     -  SSI, DM diet, A1c, glucose checks   DVT  prophylaxis: SCDs  Code Status: FULL  Family Communication: None at bedside  Consults called: Urology called by EDP   Status is: Inpatient  Remains inpatient appropriate because:Inpatient level of care appropriate due to severity of illness  Dispo: The patient is from: Home              Anticipated d/c is to: Home              Patient currently is not medically stable to d/c.   Difficult to place patient No  Jonnie Finner DO [facility]  If 7PM-7AM, please contact night-coverage [email or web address]  11/08/2020, 2:28 PM

## 2020-11-08 NOTE — ED Notes (Signed)
Urologist at the bedside  

## 2020-11-08 NOTE — ED Provider Notes (Signed)
[facility] DEPT Provider Note   CSN: [id number] Arrival date & time: 11/08/20  1032     History Chief Complaint  Patient presents with   Weakness   Hematuria    [name] is a 87 y.o. male.  Pt presents to the ED today with hematuria and weakness.  Pt said he has had bloody urine for 7 weeks.  He was seen in the ED on 6/8.  His bladder was irrigated with a foley and his hematuria improved.  Hgb 12.5 then.  He was instructed to f/u with urology.  He saw Dr. Gloriann Loan who did a cystoscopy.  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  Pt said he has continued to have hematuria and now feels very weak.  He gets sob with any movement.      Past Medical History:  Diagnosis Date   Allergy    Anemia    CAD (coronary artery disease)    pci to LAD 10/09; stable CAD by cath 05/31/08; myoview 04/11/11- no ishcemia, EF 67%; echo 05/15/09- EF>55%, mod calcification of the aortic valve leaflets   CAD S/P percutaneous coronary angioplasty 05/08/2008   LAD PCI with DES 2009- Myoview low risk 2013   CHF (congestive heart failure) ([place]) 09/07/2019   Chronic back pain    Cystitis, radiation 12/01/2013   ED (erectile dysfunction)    Elevated serum creatinine 12/14/2015   Essential hypertension, benign    Hematuria 12/01/2013   Hyperlipidemia    Ileus ([place]) 12/14/2015   Insulin dependent type 2 diabetes mellitus, controlled ([place])    Malignant neoplasm of prostate ([place]) 11/26/2013   Multiple rib fractures    left 9th, 10th, and 11th posterior rib fractures S/P fall 12/09/2015/notes 12/12/2015   Osteoarthritis of right knee 03/04/2018   PREMATURE VENTRICULAR CONTRACTIONS 05/08/2008   Qualifier: Diagnosis of  By: Rayann Heman, MD, James     Prostate cancer [facility])    s/p   Rectal bleeding 12/23/2012   Evaluated by Dr Benson Norway GI patient with history of radiation proctitis. Patient due for colonoscopy on 04/2014    Type II or unspecified  type diabetes mellitus without mention of complication, not stated as uncontrolled    Ventricular tachycardia ([place])    resuscitated; monitor 05/2008; attempted T ablation 2/10- aborted due to inappropriate substrate    Patient Active Problem List   Diagnosis Date Noted   CHF (congestive heart failure) ([place]) 09/07/2019   Chronic renal insufficiency, stage 3 (moderate) ([place]) 09/07/2019   Lower extremity edema 08/18/2019   Osteoarthritis of right knee 03/04/2018   Rib pain on left side 07/01/2016   Fall 12/14/2015   Elevated serum creatinine 12/14/2015   Ileus ([place]) 12/14/2015   Multiple fractures of ribs of left side 12/12/2015   Prostate cancer ([place]) 05/26/2014   Blood in the urine 12/01/2013   Cystitis, radiation 12/01/2013   Hematuria 12/01/2013   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   Male erectile dysfunction 11/26/2013   Pulsatile tinnitus 11/19/2013   Rectal bleeding 12/23/2012   Insulin dependent type 2 diabetes mellitus, controlled ([place])    Essential hypertension    Chronic back pain    ED (erectile dysfunction)    Anemia    Dyslipidemia, goal LDL below 70    CAD S/P percutaneous coronary angioplasty 05/08/2008   History of ventricular tachycardia 05/08/2008   PREMATURE VENTRICULAR CONTRACTIONS 05/08/2008    Past Surgical History:  Procedure Laterality Date  CARDIAC CATHETERIZATION  05/31/08   EF 55-60%, stable lesions, medical therapy   COLECTOMY  '80's   polyps   CORONARY ANGIOPLASTY WITH STENT PLACEMENT  01/19/08   PCI stent to mid LAD with Promus DES 27.5x28   IR GENERIC HISTORICAL  01/03/2016   IR THORACENTESIS ASP PLEURAL SPACE W/IMG GUIDE 01/03/2016 [facility]INTER[facility] RAD   KNEE ARTHROPLASTY Right 03/04/2018   Procedure: COMPUTER ASSISTED TOTAL KNEE ARTHROPLASTY;  Surgeon: Rod Can, MD;  Location: [facility] ORS;  Service: Orthopedics;  Laterality: Right;   PTCA  01/2008       Family History  Problem Relation Age of Onset   Diabetes Mother    Heart attack Mother     Leukemia Father    Heart disease Sister    Heart disease Brother    Diabetes Brother        pancreatic cancer    Social History   Tobacco Use   Smoking status: Former    Packs/day: 0.00    Years: 15.00    Pack years: 0.00    Types: Cigarettes    Quit date: 05/10/2017    Years since quitting: 3.5   Smokeless tobacco: Never   Tobacco comments:    Patient smokes occasionally. Not everyday.  Vaping Use   Vaping Use: Never used  Substance Use Topics   Alcohol use: No   Drug use: No    Home Medications Prior to Admission medications   Medication Sig Start Date End Date Taking? Authorizing Provider  amLODipine (NORVASC) 10 MG tablet TAKE 1 TABLET EVERY DAY 10/09/20   Lorretta Harp, MD  atorvastatin (LIPITOR) 10 MG tablet TAKE 1 TABLET (10 MG TOTAL) BY MOUTH DAILY. 05/15/20   Lorretta Harp, MD  Blood Glucose Monitoring Suppl (ACCU-CHEK AVIVA PLUS) w/Device KIT Test blood sugar 3 times daily. Dx code: E11.22 04/03/16   [name], MD  Blood Pressure Monitoring (BLOOD PRESS MONITOR/M-L CUFF) MISC 1 application by Does not apply route daily. 04/26/20   [name], MD  carvedilol (COREG) 25 MG tablet Take 1 tablet (25 mg total) by mouth 2 (two) times daily. 11/23/19 02/21/20  Lorretta Harp, MD  Continuous Blood Gluc Sensor ([place]) MISC 1 application by Does not apply route daily. 06/30/20   [name], MD  Continuous Glucose Monitor Sup KIT 1 kit by Does not apply route as directed. 04/26/20   [name], MD  ferrous sulfate 325 (65 FE) MG tablet Take 325 mg by mouth daily with breakfast.    [provider]  furosemide (LASIX) 40 MG tablet Take 1 tablet (40 mg total) by mouth 2 (two) times daily. As needed for swelling 11/23/19   Lorretta Harp, MD  glucose blood (ACCU-CHEK AVIVA PLUS) test strip Test blood sugar 3 times daily. Dx code: E11.22 04/03/16   [name], MD  hydrALAZINE (APRESOLINE) 50 MG tablet  09/27/19    [provider]  insulin NPH-regular Human (NOVOLIN 70/30) (70-30) 100 UNIT/ML injection Inject 11 Units into the skin 2 (two) times daily with a meal. 06/30/20   [name], MD  Insulin Syringe-Needle U-100 (B-D INS SYR HALF-UNIT .3CC/31G) 31G X 5/16" 0.3 ML MISC Use daily at bedtime to inject insulin. Dx code: 250.00 06/21/13   [name], MD  Lancets (ACCU-CHEK MULTICLIX) lancets Test blood sugar 3 times daily. Dx code: E11.22 04/03/16   [name], MD  lisinopril (ZESTRIL) 20 MG tablet TAKE 1 TABLET (  20 MG TOTAL) BY MOUTH DAILY. 05/31/20   Lorretta Harp, MD  mirabegron ER (MYRBETRIQ) 25 MG TB24 tablet Take 25 mg by mouth daily.    [provider]  Multiple Vitamin (MULTIVITAMIN) tablet Take 1 tablet by mouth daily. COMPLETE SENIOR VITAMINS AND M    [provider]  Omega-3 Fatty Acids (FISH OIL) 1000 MG CAPS Take 2 capsules by mouth daily.     [provider]  Vitamin D, Cholecalciferol, 1000 UNITS TABS Take 1 tablet by mouth daily.     [provider]    Allergies    Patient has no known allergies.  Review of Systems   Review of Systems  Genitourinary:  Positive for hematuria.  All other systems reviewed and are negative.  Physical Exam Updated Vital Signs BP 138/61   Pulse 69   Temp 98 F (36.7 C) (Oral)   Resp 11   SpO2 100%   Physical Exam Vitals and nursing note reviewed.  Constitutional:      Appearance: Normal appearance.  HENT:     Head: Normocephalic and atraumatic.     Right Ear: External ear normal.     Left Ear: External ear normal.     Nose: Nose normal.     Mouth/Throat:     Mouth: Mucous membranes are moist.     Pharynx: Oropharynx is clear.  Eyes:     Extraocular Movements: Extraocular movements intact.     Pupils: Pupils are equal, round, and reactive to light.     Comments: Pale conjunctiva  Cardiovascular:     Rate and Rhythm: Normal rate and regular rhythm.     Pulses: Normal  pulses.     Heart sounds: Normal heart sounds.  Pulmonary:     Effort: Pulmonary effort is normal.     Breath sounds: Normal breath sounds.  Abdominal:     General: Abdomen is flat. Bowel sounds are normal.     Palpations: Abdomen is soft.  Musculoskeletal:        General: Normal range of motion.     Cervical back: Normal range of motion and neck supple.  Skin:    General: Skin is warm.     Capillary Refill: Capillary refill takes less than 2 seconds.  Neurological:     General: No focal deficit present.     Mental Status: He is alert and oriented to person, place, and time.  Psychiatric:        Mood and Affect: Mood normal.        Behavior: Behavior normal.        Thought Content: Thought content normal.        Judgment: Judgment normal.    ED Results / Procedures / Treatments   Labs (all labs ordered are listed, but only abnormal results are displayed) Labs Reviewed  BASIC METABOLIC PANEL - Abnormal; Notable for the following components:      Result Value   Glucose, Bld 141 (*)    BUN 32 (*)    Creatinine, Ser 2.56 (*)    Calcium 8.4 (*)    GFR, Estimated 24 (*)    All other components within normal limits  CBC - Abnormal; Notable for the following components:   RBC 2.60 (*)    Hemoglobin 7.1 (*)    HCT 23.2 (*)    All other components within normal limits  URINALYSIS, ROUTINE W REFLEX MICROSCOPIC - Abnormal; Notable for the following components:   Color, Urine RED (*)  APPearance CLOUDY (*)    Glucose, UA 50 (*)    Hgb urine dipstick MODERATE (*)    Protein, ur 100 (*)    RBC / HPF >50 (*)    WBC, UA >50 (*)    Bacteria, UA RARE (*)    All other components within normal limits  CBG MONITORING, ED - Abnormal; Notable for the following components:   Glucose-Capillary 117 (*)    All other components within normal limits  URINE CULTURE  TYPE AND SCREEN  PREPARE RBC (CROSSMATCH)    EKG None  Radiology DG Chest 2 View  Result Date: 11/08/2020 CLINICAL  DATA:  Irradiation cystitis. Hematuria. History of prostate cancer. History of smoking. History of CHF. History of diabetes. EXAM: CHEST - 2 VIEW COMPARISON:  Chest x-ray 08/06/2019, 10/19/2018. FINDINGS: Mediastinum and hilar structures normal. Stable cardiomegaly. No pulmonary venous congestion. Chronic interstitial changes again noted. Stable left base pleuroparenchymal thickening consistent with scarring. Degenerative change thoracic spine. Surgical clips upper abdomen. IMPRESSION: 1.  Stable cardiomegaly. 2. Stable chronic interstitial changes. Left base pleural-parenchymal thickening consistent with scarring again noted. No acute abnormality identified. Electronically Signed   By: Marcello Moores  Register   On: 11/08/2020 06:52    Procedures Procedures   Medications Ordered in ED Medications  0.9 %  sodium chloride infusion (has no administration in time range)  sodium chloride irrigation 0.9 % 3,000 mL (has no administration in time range)    ED Course  I have reviewed the triage vital signs and the nursing notes.  Pertinent labs & imaging results that were available during my care of the patient were reviewed by me and considered in my medical decision making (see chart for details).    MDM Rules/Calculators/A&P                           Pt was d/w Dr. Diona Fanti (urology) who recommended placing a 3 way foley and irrigating his bladder.    As pt is symptomatic from his anemia (hgb down to 7.1 from 12.2 on 6/20), I have ordered 2 units for transfusion.  Pt will be seen by Dr. Diona Fanti in consult.  He requests the hospitalists admit.   Final Clinical Impression(s) / ED Diagnoses Final diagnoses:  Symptomatic anemia  Radiation cystitis  Gross hematuria  CKD (chronic kidney disease) stage 4, GFR 15-29 ml/min [facility])    Rx / DC Orders ED Discharge Orders     None        Isla Pence, MD 11/10/20 2254

## 2020-11-08 NOTE — ED Notes (Addendum)
CBI initiated at 1449. 800cc's hematuria drained from foley bag at prior to bladder irrigation. 3 way catheter draining without incident at this time.

## 2020-11-08 NOTE — Consult Note (Signed)
Urology Consult  Consulting MD: Dr. Isla Pence  CC: Gross hematuria with anemia  HPI: This is a 87year old male with history of prostate cancer, status post radiotherapy.  Until recently, he was followed in [facility]rology, by Dr. Jonette Eva.  More recently, he has seen Dr. Alen Blew.  He has had recurrent gross hematuria.  Recently, over the past few weeks, he has had persistent hematuria with passing clots, strangury but no fever or chills.  He presented the emergency room the day and had significant anemia.  He is admitted for management of his hematuria as well as his hemorrhagic anemia.  The  Nursing staff has placed a three-way Foley catheter and hooked up to CBI.  The patient has been more comfortable with this.  PMH: Past Medical History:  Diagnosis Date   Allergy    Anemia    CAD (coronary artery disease)    pci to LAD 10/09; stable CAD by cath 05/31/08; myoview 04/11/11- no ishcemia, EF 67%; echo 05/15/09- EF>55%, mod calcification of the aortic valve leaflets   CAD S/P percutaneous coronary angioplasty 05/08/2008   LAD PCI with DES 2009- Myoview low risk 2013   CHF (congestive heart failure) ([place]) 09/07/2019   Chronic back pain    Cystitis, radiation 12/01/2013   ED (erectile dysfunction)    Elevated serum creatinine 12/14/2015   Essential hypertension, benign    Hematuria 12/01/2013   Hyperlipidemia    Ileus ([place]) 12/14/2015   Insulin dependent type 2 diabetes mellitus, controlled ([place])    Malignant neoplasm of prostate ([place]) 11/26/2013   Multiple rib fractures    left 9th, 10th, and 11th posterior rib fractures S/P fall 12/09/2015/notes 12/12/2015   Osteoarthritis of right knee 03/04/2018   PREMATURE VENTRICULAR CONTRACTIONS 05/08/2008   Qualifier: Diagnosis of  By: Rayann Heman, MD, James     Prostate cancer [facility])    s/p   Rectal bleeding 12/23/2012   Evaluated by Dr Benson Norway GI patient with history of radiation proctitis. Patient due for colonoscopy on 04/2014    Type II or  unspecified type diabetes mellitus without mention of complication, not stated as uncontrolled    Ventricular tachycardia ([place])    resuscitated; monitor 05/2008; attempted T ablation 2/10- aborted due to inappropriate substrate    PSH: Past Surgical History:  Procedure Laterality Date   CARDIAC CATHETERIZATION  05/31/08   EF 55-60%, stable lesions, medical therapy   COLECTOMY  '80's   polyps   CORONARY ANGIOPLASTY WITH STENT PLACEMENT  01/19/08   PCI stent to mid LAD with Promus DES 27.5x28   IR GENERIC HISTORICAL  01/03/2016   IR THORACENTESIS ASP PLEURAL SPACE W/IMG GUIDE 01/03/2016 [facility]   KNEE ARTHROPLASTY Right 03/04/2018   Procedure: COMPUTER ASSISTED TOTAL KNEE ARTHROPLASTY;  Surgeon: Rod Can, MD;  Location: [facility];  Service: Orthopedics;  Laterality: Right;   PTCA  01/2008    Allergies: No Known Allergies  Medications: (Not in a hospital admission)    Social History: Social History   Socioeconomic History   Marital status: Married    Spouse name: Not on file   Number of children: 7   Years of education: Not on file   Highest education level: Not on file  Occupational History   Occupation: RETIRED    Employer: RETIRED  Tobacco Use   Smoking status: Former    Packs/day: 0.00    Years: 15.00    Pack years: 0.00    Types: Cigarettes  Quit date: 05/10/2017    Years since quitting: 3.5   Smokeless tobacco: Never   Tobacco comments:    Patient smokes occasionally. Not everyday.  Vaping Use   Vaping Use: Never used  Substance and Sexual Activity   Alcohol use: No   Drug use: No   Sexual activity: Never  Other Topics Concern   Not on file  Social History Narrative   Not on file   Social Determinants of Health   Financial Resource Strain: Not on file  Food Insecurity: Not on file  Transportation Needs: Not on file  Physical Activity: Not on file  Stress: Not on file  Social Connections: Not on file  Intimate Partner Violence: Not on  file    Family History: Family History  Problem Relation Age of Onset   Diabetes Mother    Heart attack Mother    Leukemia Father    Heart disease Sister    Heart disease Brother    Diabetes Brother        pancreatic cancer    Review of Systems: Positive: Gross hematuria, dysuria, clots, feeling of incomplete emptying Negative: N.  A further 10 point review of systems was negative except what is listed in the HPI.  Physical Exam: @VITALS2 @ General: No acute distress.  Awake. Head:  Normocephalic.  Atraumatic. ENT:  EOMI.  Mucous membranes moist Neck:  Supple.  No lymphadenopathy. CV:  Regular rate. Pulmonary: Equal effort bilaterally.   Abdomen: Soft.  None tender to palpation. Skin:  Normal turgor.  No visible rash. Extremity: No gross deformity of extremities.  Neurologic: Alert. Appropriate mood. Penis:  Circumcised.  No lesions. Urethra: Orthotopic meatus. Scrotum: No lesions.  No ecchymosis.  No erythema. Testicles: Descended bilaterally.  No masses bilaterally. Epididymis: Palpable bilaterally.  Non Tender to palpation.  Studies:  Recent Labs    11/08/20 1204  HGB 7.1*  WBC 7.4  PLT 233    Recent Labs    11/08/20 1204  NA 138  K 4.1  CL 106  CO2 22  BUN 32*  CREATININE 2.56*  CALCIUM 8.4*  GFRNONAA 24*     No results for input(s): INR, APTT in the last 72 hours.  Invalid input(s): PT   Invalid input(s): ABG  Catheter had previously been placed.  I hand irrigated the patient's bladder with approximately 500 cc of normal saline.  Very few small clots were liberated.  This was hooked back to CBI.  Assessment: Radiation cystitis with anemia.  This is recurrent.  The patient did have an appointment with wound care to begin HBO yesterday.  This will obviously have to be delayed somewhat  Plan: 1.  Continue CBI for now.  This can be tailored to/tapered as his urine clears  2.  We will follow while in the hospital.  Hopefully, with irrigation his  hematuria will clear and he will be able to be discharged soon.    Pager:[phone]

## 2020-11-08 NOTE — ED Notes (Signed)
3-way 22French placed by RN Langley Gauss Rhew without incident.

## 2020-11-08 NOTE — ED Notes (Signed)
Per ED provider, Dr. Gilford Raid and Urology consult, pt to have CBI initiated in ED.

## 2020-11-09 DIAGNOSIS — N3040 Irradiation cystitis without hematuria: Secondary | ICD-10-CM

## 2020-11-09 DIAGNOSIS — N179 Acute kidney failure, unspecified: Secondary | ICD-10-CM

## 2020-11-09 DIAGNOSIS — N1832 Chronic kidney disease, stage 3b: Secondary | ICD-10-CM | POA: Diagnosis not present

## 2020-11-09 LAB — TYPE AND SCREEN
ABO/RH(D): A POS
Antibody Screen: NEGATIVE
Unit division: 0
Unit division: 0

## 2020-11-09 LAB — COMPREHENSIVE METABOLIC PANEL
ALT: 14 U/L (ref 0–44)
AST: 13 U/L — ABNORMAL LOW (ref 15–41)
Albumin: 3 g/dL — ABNORMAL LOW (ref 3.5–5.0)
Alkaline Phosphatase: 34 U/L — ABNORMAL LOW (ref 38–126)
Anion gap: 6 (ref 5–15)
BUN: 28 mg/dL — ABNORMAL HIGH (ref 8–23)
CO2: 22 mmol/L (ref 22–32)
Calcium: 8.5 mg/dL — ABNORMAL LOW (ref 8.9–10.3)
Chloride: 110 mmol/L (ref 98–111)
Creatinine, Ser: 2.1 mg/dL — ABNORMAL HIGH (ref 0.61–1.24)
GFR, Estimated: 30 mL/min — ABNORMAL LOW (ref >60)
Glucose, Bld: 123 mg/dL — ABNORMAL HIGH (ref 70–99)
Potassium: 4 mmol/L (ref 3.5–5.1)
Sodium: 138 mmol/L (ref 135–145)
Total Bilirubin: 1 mg/dL (ref 0.3–1.2)
Total Protein: 5.8 g/dL — ABNORMAL LOW (ref 6.5–8.1)

## 2020-11-09 LAB — CBC
HCT: 29.4 % — ABNORMAL LOW (ref 39.0–52.0)
Hemoglobin: 9.5 g/dL — ABNORMAL LOW (ref 13.0–17.0)
MCH: 28.4 pg (ref 26.0–34.0)
MCHC: 32.3 g/dL (ref 30.0–36.0)
MCV: 87.8 fL (ref 80.0–100.0)
Platelets: 203 10*3/uL (ref 150–400)
RBC: 3.35 MIL/uL — ABNORMAL LOW (ref 4.22–5.81)
RDW: 14.6 % (ref 11.5–15.5)
WBC: 9.5 10*3/uL (ref 4.0–10.5)
nRBC: 0 % (ref 0.0–0.2)

## 2020-11-09 LAB — BPAM RBC
Blood Product Expiration Date: 202208262359
Blood Product Expiration Date: 202208272359
ISSUE DATE / TIME: 202208031637
ISSUE DATE / TIME: 202208032128
Unit Type and Rh: 6200
Unit Type and Rh: 6200

## 2020-11-09 LAB — GLUCOSE, CAPILLARY
Glucose-Capillary: 220 mg/dL — ABNORMAL HIGH (ref 70–99)
Glucose-Capillary: 156 mg/dL — ABNORMAL HIGH (ref 70–99)

## 2020-11-09 LAB — HEMOGLOBIN AND HEMATOCRIT, BLOOD
HCT: 28.4 % — ABNORMAL LOW (ref 39.0–52.0)
Hemoglobin: 9.4 g/dL — ABNORMAL LOW (ref 13.0–17.0)

## 2020-11-09 LAB — CBG MONITORING, ED: Glucose-Capillary: 140 mg/dL — ABNORMAL HIGH (ref 70–99)

## 2020-11-09 MED ORDER — TRAMADOL HCL 50 MG PO TABS
50.0000 mg | ORAL_TABLET | Freq: Once | ORAL | Status: AC
  Administered 2020-11-09: 50 mg via ORAL
  Filled 2020-11-09: qty 1

## 2020-11-09 MED ORDER — MELATONIN 3 MG PO TABS
3.0000 mg | ORAL_TABLET | Freq: Every day | ORAL | Status: DC
  Administered 2020-11-09 – 2020-11-11 (×3): 3 mg via ORAL
  Filled 2020-11-09 (×3): qty 1

## 2020-11-09 NOTE — Hospital Course (Addendum)
Mr. Grahn is an 87  yo male with PMH prostate cancer s/p radiation treatment (several years ago), DMII, HTN who presented to the hospital with difficulty urinating at home and found to have hematuria and weakness.  He has had ongoing hematuria overall worsening at home.  He was finally convinced to come to the ER for further work-up and evaluation.  Hemoglobin on work-up was 7.1 g/dL.  He was transfused 2 units PRBC on admission and urology was consulted.  He underwent Foley catheter placement and started on CBI. He then underwent cystoscopy with clot evacuation and TURP on 11/10/2020.  Urine cleared after this. His Foley was removed on 11/12/2020 and he underwent a voiding trial.  He was able to void well prior to discharge with ongoing clear yellow urine.

## 2020-11-09 NOTE — ED Notes (Signed)
1700 mL emptied from drainage bag

## 2020-11-09 NOTE — Progress Notes (Signed)
Urology Inpatient Progress Report  Hematuria [R31.9]        Intv/Subj: No acute events overnight. Patient is without complaint.  Hemoglobin stable.  Urine is light pink on moderate CBI patient is working on getting set up for hyperbaric oxygen.  Principal Problem:   Cystitis, radiation Active Problems:   CAD S/P percutaneous coronary angioplasty   Insulin dependent type 2 diabetes mellitus, controlled ([place])   Essential hypertension   Dyslipidemia, goal LDL below 70   Hematuria   Chronic renal insufficiency, stage 3 (moderate) (HCC)  Current Facility-Administered Medications  Medication Dose Route Frequency Provider Last Rate Last Admin   acetaminophen (TYLENOL) tablet 650 mg  650 mg Oral Q6H PRN Marylyn Ishihara, Tyrone A, DO   650 mg at 11/09/20 4854   Or   acetaminophen (TYLENOL) suppository 650 mg  650 mg Rectal Q6H PRN Marylyn Ishihara, Tyrone A, DO       amLODipine (NORVASC) tablet 10 mg  10 mg Oral Daily Kyle, Tyrone A, DO   10 mg at 11/09/20 1025   atorvastatin (LIPITOR) tablet 10 mg  10 mg Oral Daily Kyle, Tyrone A, DO   10 mg at 11/09/20 1025   carvedilol (COREG) tablet 25 mg  25 mg Oral BID Marylyn Ishihara, Tyrone A, DO   25 mg at 11/09/20 1025   furosemide (LASIX) tablet 40 mg  40 mg Oral BID Marylyn Ishihara, Tyrone A, DO   40 mg at 11/09/20 0826   insulin aspart (novoLOG) injection 0-15 Units  0-15 Units Subcutaneous TID WC Kyle, Tyrone A, DO   2 Units at 11/09/20 6270   insulin aspart (novoLOG) injection 0-5 Units  0-5 Units Subcutaneous QHS Kyle, Tyrone A, DO       sodium chloride irrigation 0.9 % 3,000 mL  3,000 mL Irrigation Continuous Kyle, Tyrone A, DO   Stopped at 11/09/20 1137   Current Outpatient Medications  Medication Sig Dispense Refill   acetaminophen (TYLENOL) 500 MG tablet Take 1,000 mg by mouth daily.     amLODipine (NORVASC) 10 MG tablet TAKE 1 TABLET EVERY DAY (Patient taking differently: Take 10 mg by mouth daily.) 90 tablet 1   atorvastatin (LIPITOR) 10 MG tablet TAKE 1 TABLET (10 MG TOTAL)  BY MOUTH DAILY. 90 tablet 2   carvedilol (COREG) 25 MG tablet Take 1 tablet (25 mg total) by mouth 2 (two) times daily. 180 tablet 3   furosemide (LASIX) 40 MG tablet Take 1 tablet (40 mg total) by mouth 2 (two) times daily. As needed for swelling (Patient taking differently: Take 40 mg by mouth 2 (two) times daily.) 180 tablet 3   insulin NPH-regular Human (NOVOLIN 70/30) (70-30) 100 UNIT/ML injection Inject 11 Units into the skin 2 (two) times daily with a meal. (Patient taking differently: Inject 15 Units into the skin 2 (two) times daily with a meal.) 15 mL 5   lisinopril (ZESTRIL) 40 MG tablet Take 40 mg by mouth daily.     Multiple Vitamin (MULTIVITAMIN) tablet Take 1 tablet by mouth daily. COMPLETE SENIOR VITAMINS AND M     Omega-3 Fatty Acids (FISH OIL) 1000 MG CAPS Take 1 capsule by mouth daily.     Vitamin D, Cholecalciferol, 1000 UNITS TABS Take 1 tablet by mouth daily.      Blood Glucose Monitoring Suppl (ACCU-CHEK AVIVA PLUS) w/Device KIT Test blood sugar 3 times daily. Dx code: E11.22 1 kit 0   Blood Pressure Monitoring (BLOOD PRESS MONITOR/M-L CUFF) MISC 1 application by Does not apply route daily. 1  each 0   Continuous Blood Gluc Sensor (FREESTYLE LIBRE SENSOR SYSTEM) MISC 1 application by Does not apply route daily. 1 each 2   Continuous Glucose Monitor Sup KIT 1 kit by Does not apply route as directed. 1 kit 0   glucose blood (ACCU-CHEK AVIVA PLUS) test strip Test blood sugar 3 times daily. Dx code: E11.22 300 each 3   Insulin Syringe-Needle U-100 (B-D INS SYR HALF-UNIT .3CC/31G) 31G X 5/16" 0.3 ML MISC Use daily at bedtime to inject insulin. Dx code: 250.00 100 each 3   Lancets (ACCU-CHEK MULTICLIX) lancets Test blood sugar 3 times daily. Dx code: E11.22 300 each 3   lisinopril (ZESTRIL) 20 MG tablet TAKE 1 TABLET (20 MG TOTAL) BY MOUTH DAILY. (Patient not taking: No sig reported) 90 tablet 2     Objective: Vital: Vitals:   11/09/20 0800 11/09/20 1000 11/09/20 1145 11/09/20  1200  BP: (!) 152/71 137/64 132/63 136/67  Pulse: 82 68 78 73  Resp: 16 15 13 15   Temp:   98 F (36.7 C)   TempSrc:   Oral   SpO2: 95% 100% 100% 100%   I/Os: I/O last 3 completed shifts: In: 9702 [Blood:1345] Out: -   Physical Exam:  General: Patient is in no apparent distress Lungs: Normal respiratory effort, chest expands symmetrically. GI: The abdomen is soft and nontender without mass. Foley: Three-way Foley catheter in place draining dark pink urine on moderate CBI Ext: lower extremities symmetric  Lab Results: Recent Labs    11/08/20 1204 11/08/20 2103 11/09/20 0130 11/09/20 0429  WBC 7.4  --   --  9.5  HGB 7.1* 8.5* 9.4* 9.5*  HCT 23.2* 26.2* 28.4* 29.4*   Recent Labs    11/08/20 1204 11/09/20 0429  NA 138 138  K 4.1 4.0  CL 106 110  CO2 22 22  GLUCOSE 141* 123*  BUN 32* 28*  CREATININE 2.56* 2.10*  CALCIUM 8.4* 8.5*   No results for input(s): LABPT, INR in the last 72 hours. No results for input(s): LABURIN in the last 72 hours. Results for orders placed or performed during the hospital encounter of 11/08/20  Resp Panel by RT-PCR (Flu A&B, Covid) Urine, Clean Catch     Status: None   Collection Time: 11/08/20  3:03 PM   Specimen: Urine, Clean Catch; Nasopharyngeal(NP) swabs in vial transport medium  Result Value Ref Range Status   SARS Coronavirus 2 by RT PCR NEGATIVE NEGATIVE Final    Comment: (NOTE) SARS-CoV-2 target nucleic acids are NOT DETECTED.  The SARS-CoV-2 RNA is generally detectable in upper respiratory specimens during the acute phase of infection. The lowest concentration of SARS-CoV-2 viral copies this assay can detect is 138 copies/mL. A negative result does not preclude SARS-Cov-2 infection and should not be used as the sole basis for treatment or other patient management decisions. A negative result may occur with  improper specimen collection/handling, submission of specimen other than nasopharyngeal swab, presence of viral  mutation(s) within the areas targeted by this assay, and inadequate number of viral copies(<138 copies/mL). A negative result must be combined with clinical observations, patient history, and epidemiological information. The expected result is Negative.  Fact Sheet for Patients:  EntrepreneurPulse.com.au  Fact Sheet for Healthcare Providers:  IncredibleEmployment.be  This test is no t yet approved or cleared by the [place] FDA and  has been authorized for detection and/or diagnosis of SARS-CoV-2 by FDA under an Emergency Use Authorization (EUA). This EUA will remain  in effect (  meaning this test can be used) for the duration of the COVID-19 declaration under Section 564(b)(1) of the Act, 21 U.S.C.section 360bbb-3(b)(1), unless the authorization is terminated  or revoked sooner.       Influenza A by PCR NEGATIVE NEGATIVE Final   Influenza B by PCR NEGATIVE NEGATIVE Final    Comment: (NOTE) The Xpert Xpress SARS-CoV-2/FLU/RSV plus assay is intended as an aid in the diagnosis of influenza from Nasopharyngeal swab specimens and should not be used as a sole basis for treatment. Nasal washings and aspirates are unacceptable for Xpert Xpress SARS-CoV-2/FLU/RSV testing.  Fact Sheet for Patients: EntrepreneurPulse.com.au  Fact Sheet for Healthcare Providers: IncredibleEmployment.be  This test is not yet approved or cleared by the [place] FDA and has been authorized for detection and/or diagnosis of SARS-CoV-2 by FDA under an Emergency Use Authorization (EUA). This EUA will remain in effect (meaning this test can be used) for the duration of the COVID-19 declaration under Section 564(b)(1) of the Act, 21 U.S.C. section 360bbb-3(b)(1), unless the authorization is terminated or revoked.  Performed at [facility], [place] [address] [place], [place] [address]      Studies/Results: DG Chest 2 View  Result Date: 11/08/2020 CLINICAL DATA:  Irradiation cystitis. Hematuria. History of prostate cancer. History of smoking. History of CHF. History of diabetes. EXAM: CHEST - 2 VIEW COMPARISON:  Chest x-ray 08/06/2019, 10/19/2018. FINDINGS: Mediastinum and hilar structures normal. Stable cardiomegaly. No pulmonary venous congestion. Chronic interstitial changes again noted. Stable left base pleuroparenchymal thickening consistent with scarring. Degenerative change thoracic spine. Surgical clips upper abdomen. IMPRESSION: 1.  Stable cardiomegaly. 2. Stable chronic interstitial changes. Left base pleural-parenchymal thickening consistent with scarring again noted. No acute abnormality identified. Electronically Signed   By: Marcello Moores  Register   On: 11/08/2020 06:52    Assessment: Radiation cystitis with gross hematuria Acute blood loss anemia  ,    doing well.  Plan: Continue to monitor hemoglobin.  I will make him n.p.o. at midnight.  If persistent bleeding with drop in hemoglobin, I will plan for clot evacuation and fulguration in the operating room.   Link Snuffer, MD Urology 11/09/2020, 12:43 PM

## 2020-11-09 NOTE — Assessment & Plan Note (Addendum)
-  patient has history of CKD3b. Baseline creat ~ 2, eGFR 33 - patient presents with increase in creat >0.3 mg/dL above baseline, creat increase >1.5x baseline presumed to have occurred within past 7 days PTA - creat 2.56 on admission; likely from BOO  - now s/p foley and CBI -Underwent cystoscopy with clot evacuation in OR with urology on 11/10/2020 -Needs repeat BMP at follow-up

## 2020-11-09 NOTE — ED Notes (Addendum)
1000 mL emptied from drainage bag

## 2020-11-09 NOTE — Progress Notes (Signed)
Progress Note    [name]   RCV:[id number]  DOB: Mar 01, 1934  DOA: 11/08/2020     1  PCP: Robyne Peers, MD  CC: hematuria  Hospital Course: [name] is an 87  yo male with PMH prostate cancer s/p radiation treatment (several years ago), DMII, HTN who presented to the hospital with difficulty urinating at home and found to have hematuria and weakness.  He has had ongoing hematuria overall worsening at home.  He was finally convinced to come to the ER for further work-up and evaluation.  Hemoglobin on work-up was 7.1 g/dL.  He was transfused 2 units PRBC on admission and urology was consulted.  He underwent Foley catheter placement and started on CBI.  Interval History:  Seen in the ER this morning. Still having hematuria but voiding better.   ROS: Constitutional: negative for chills and fevers, Respiratory: negative for cough and sputum, Cardiovascular: negative for chest pain, and Gastrointestinal: negative for abdominal pain  Assessment & Plan: * Cystitis, radiation - s/p prostate cancer treated with radiation therapy - recurrent issue; plan was to start HBO outpatient - continue CBI as per urology  Hematuria - baseline Hgb 10-12 g/dL - Hgb 7.1 g/dL on admission; s/p 2 units PRBC on 8/3; repeat Hgb on 8/4 is 9.4 g/dL - continue trending H/H and will transfuse further if needed - continue CBI as per urology   Acute renal failure superimposed on stage 3b chronic kidney disease ([place]) - patient has history of CKD3b. Baseline creat ~ 2, eGFR 33 - patient presents with increase in creat >0.3 mg/dL above baseline, creat increase >1.5x baseline presumed to have occurred within past 7 days PTA - creat 2.56 on admission; likely from BOO  - now s/p foley and CBI; trend renal response   Dyslipidemia, goal LDL below 70 - Continue statin  Essential hypertension - Continue current regimen  Insulin dependent type 2 diabetes mellitus, controlled ([place]) - continue SSI and  CBG monitoring     Old records reviewed in assessment of this patient  Antimicrobials:   DVT prophylaxis: SCDs Start: 11/08/20 1626   Code Status:   Code Status: Full Code Family Communication:   Disposition Plan: Status is: Inpatient  Remains inpatient appropriate because:IV treatments appropriate due to intensity of illness or inability to take PO and Inpatient level of care appropriate due to severity of illness  Dispo: The patient is from: Home              Anticipated d/c is to: Home              Patient currently is not medically stable to d/c.   Difficult to place patient No  Risk of unplanned readmission score: Unplanned Admission- Pilot do not use: 20.59   Objective: Blood pressure (!) 147/59, pulse 63, temperature 97.9 F (36.6 C), temperature source Oral, resp. rate 16, height 6' 0.01" (1.829 m), weight 107 kg, SpO2 100 %.  Examination: General appearance: alert, cooperative, and no distress Head: Normocephalic, without obvious abnormality, atraumatic Eyes:  EOMI Lungs: clear to auscultation bilaterally Heart: regular rate and rhythm and S1, S2 normal Abdomen: normal findings: bowel sounds normal and soft, non-tender GU: hematuria noted in foley bag  Extremities:  no edema Skin: mobility and turgor normal Neurologic: Grossly normal  Consultants:  Urology  Procedures:    Data Reviewed: I have personally reviewed following labs and imaging studies Results for orders placed or performed during the hospital encounter of 11/08/20 (from the  past 24 hour(s))  Resp Panel by RT-PCR (Flu A&B, Covid) Urine, Clean Catch     Status: None   Collection Time: 11/08/20  3:03 PM   Specimen: Urine, Clean Catch; Nasopharyngeal(NP) swabs in vial transport medium  Result Value Ref Range   SARS Coronavirus 2 by RT PCR NEGATIVE NEGATIVE   Influenza A by PCR NEGATIVE NEGATIVE   Influenza B by PCR NEGATIVE NEGATIVE  Hemoglobin A1c     Status: Abnormal   Collection Time:  11/08/20  4:26 PM  Result Value Ref Range   Hgb A1c MFr Bld 7.5 (H) 4.8 - 5.6 %   Mean Plasma Glucose 168.55 mg/dL  CBG monitoring, ED     Status: Abnormal   Collection Time: 11/08/20  4:58 PM  Result Value Ref Range   Glucose-Capillary 108 (H) 70 - 99 mg/dL  Hemoglobin and hematocrit, blood     Status: Abnormal   Collection Time: 11/08/20  9:03 PM  Result Value Ref Range   Hemoglobin 8.5 (L) 13.0 - 17.0 g/dL   HCT 26.2 (L) 39.0 - 52.0 %  CBG monitoring, ED     Status: Abnormal   Collection Time: 11/08/20 10:14 PM  Result Value Ref Range   Glucose-Capillary 143 (H) 70 - 99 mg/dL  Hemoglobin and hematocrit, blood     Status: Abnormal   Collection Time: 11/09/20  1:30 AM  Result Value Ref Range   Hemoglobin 9.4 (L) 13.0 - 17.0 g/dL   HCT 28.4 (L) 39.0 - 52.0 %  Comprehensive metabolic panel     Status: Abnormal   Collection Time: 11/09/20  4:29 AM  Result Value Ref Range   Sodium 138 135 - 145 mmol/L   Potassium 4.0 3.5 - 5.1 mmol/L   Chloride 110 98 - 111 mmol/L   CO2 22 22 - 32 mmol/L   Glucose, Bld 123 (H) 70 - 99 mg/dL   BUN 28 (H) 8 - 23 mg/dL   Creatinine, Ser 2.10 (H) 0.61 - 1.24 mg/dL   Calcium 8.5 (L) 8.9 - 10.3 mg/dL   Total Protein 5.8 (L) 6.5 - 8.1 g/dL   Albumin 3.0 (L) 3.5 - 5.0 g/dL   AST 13 (L) 15 - 41 U/L   ALT 14 0 - 44 U/L   Alkaline Phosphatase 34 (L) 38 - 126 U/L   Total Bilirubin 1.0 0.3 - 1.2 mg/dL   GFR, Estimated 30 (L) >60 mL/min   Anion gap 6 5 - 15  CBC     Status: Abnormal   Collection Time: 11/09/20  4:29 AM  Result Value Ref Range   WBC 9.5 4.0 - 10.5 K/uL   RBC 3.35 (L) 4.22 - 5.81 MIL/uL   Hemoglobin 9.5 (L) 13.0 - 17.0 g/dL   HCT 29.4 (L) 39.0 - 52.0 %   MCV 87.8 80.0 - 100.0 fL   MCH 28.4 26.0 - 34.0 pg   MCHC 32.3 30.0 - 36.0 g/dL   RDW 14.6 11.5 - 15.5 %   Platelets 203 150 - 400 K/uL   nRBC 0.0 0.0 - 0.2 %  CBG monitoring, ED     Status: Abnormal   Collection Time: 11/09/20  8:19 AM  Result Value Ref Range    Glucose-Capillary 140 (H) 70 - 99 mg/dL  BLOOD TRANSFUSION REPORT - SCANNED     Status: None   Collection Time: 11/09/20 10:51 AM   Narrative   Ordered by an unspecified provider.    Recent Results (from the past 240 hour(s))  Resp Panel by RT-PCR (Flu A&B, Covid) Urine, Clean Catch     Status: None   Collection Time: 11/08/20  3:03 PM   Specimen: Urine, Clean Catch; Nasopharyngeal(NP) swabs in vial transport medium  Result Value Ref Range Status   SARS Coronavirus 2 by RT PCR NEGATIVE NEGATIVE Final    Comment: (NOTE) SARS-CoV-2 target nucleic acids are NOT DETECTED.  The SARS-CoV-2 RNA is generally detectable in upper respiratory specimens during the acute phase of infection. The lowest concentration of SARS-CoV-2 viral copies this assay can detect is 138 copies/mL. A negative result does not preclude SARS-Cov-2 infection and should not be used as the sole basis for treatment or other patient management decisions. A negative result may occur with  improper specimen collection/handling, submission of specimen other than nasopharyngeal swab, presence of viral mutation(s) within the areas targeted by this assay, and inadequate number of viral copies(<138 copies/mL). A negative result must be combined with clinical observations, patient history, and epidemiological information. The expected result is Negative.  Fact Sheet for Patients:  EntrepreneurPulse.com.au  Fact Sheet for Healthcare Providers:  IncredibleEmployment.be  This test is no t yet approved or cleared by the [place] FDA and  has been authorized for detection and/or diagnosis of SARS-CoV-2 by FDA under an Emergency Use Authorization (EUA). This EUA will remain  in effect (meaning this test can be used) for the duration of the COVID-19 declaration under Section 564(b)(1) of the Act, 21 U.S.C.section 360bbb-3(b)(1), unless the authorization is terminated  or revoked sooner.        Influenza A by PCR NEGATIVE NEGATIVE Final   Influenza B by PCR NEGATIVE NEGATIVE Final    Comment: (NOTE) The Xpert Xpress SARS-CoV-2/FLU/RSV plus assay is intended as an aid in the diagnosis of influenza from Nasopharyngeal swab specimens and should not be used as a sole basis for treatment. Nasal washings and aspirates are unacceptable for Xpert Xpress SARS-CoV-2/FLU/RSV testing.  Fact Sheet for Patients: EntrepreneurPulse.com.au  Fact Sheet for Healthcare Providers: IncredibleEmployment.be  This test is not yet approved or cleared by the [place] FDA and has been authorized for detection and/or diagnosis of SARS-CoV-2 by FDA under an Emergency Use Authorization (EUA). This EUA will remain in effect (meaning this test can be used) for the duration of the COVID-19 declaration under Section 564(b)(1) of the Act, 21 U.S.C. section 360bbb-3(b)(1), unless the authorization is terminated or revoked.  Performed at [facility], [place] [address] [place], [place] [address]      Radiology Studies: DG Chest 2 View  Result Date: 11/08/2020 CLINICAL DATA:  Irradiation cystitis. Hematuria. History of prostate cancer. History of smoking. History of CHF. History of diabetes. EXAM: CHEST - 2 VIEW COMPARISON:  Chest x-ray 08/06/2019, 10/19/2018. FINDINGS: Mediastinum and hilar structures normal. Stable cardiomegaly. No pulmonary venous congestion. Chronic interstitial changes again noted. Stable left base pleuroparenchymal thickening consistent with scarring. Degenerative change thoracic spine. Surgical clips upper abdomen. IMPRESSION: 1.  Stable cardiomegaly. 2. Stable chronic interstitial changes. Left base pleural-parenchymal thickening consistent with scarring again noted. No acute abnormality identified. Electronically Signed   By: Marcello Moores  Register   On: 11/08/2020 06:52   No orders to display    Scheduled Meds:  amLODipine   10 mg Oral Daily   atorvastatin  10 mg Oral Daily   carvedilol  25 mg Oral BID   furosemide  40 mg Oral BID   insulin aspart  0-15 Units Subcutaneous TID WC   insulin aspart  0-5 Units  Subcutaneous QHS   PRN Meds: acetaminophen **OR** acetaminophen Continuous Infusions:  sodium chloride irrigation Stopped (11/09/20 1137)     LOS: 1 day  Time spent: Greater than 50% of the 35 minute visit was spent in counseling/coordination of care for the patient as laid out in the A&P.   Dwyane Dee, MD [facility] 11/09/2020, 2:34 PM

## 2020-11-09 NOTE — Assessment & Plan Note (Signed)
-   continue SSI and CBG monitoring  

## 2020-11-09 NOTE — Assessment & Plan Note (Signed)
Continue statin. 

## 2020-11-09 NOTE — Assessment & Plan Note (Addendum)
-   baseline Hgb 10-12 g/dL - Hgb 7.1 g/dL on admission; s/p 2 units PRBC on 8/3; repeat Hgb on 8/4 is 9.4 g/dL - Hgb improved but slight drop this am to 8.2 g/dL (probable lag as urine clear and no further bleeding - Hgb stable at discharge; does need repeat CBC at followup - urine remained clear yellow at time of discharge

## 2020-11-09 NOTE — Assessment & Plan Note (Signed)
Continue current regimen

## 2020-11-09 NOTE — Assessment & Plan Note (Addendum)
-   s/p prostate cancer treated with radiation therapy - recurrent issue; plan was to start HBO outpatient -Treated initially with CBI per urology during hospitalization - underwent cystoscopy with clot evacuation and TURP on 11/10/2020; this morning, 11/11/2020, his urine has turned to clear yellow and Foley draining well - Foley removed on 11/12/2020.  Voiding trial considered passed prior to discharge with clear yellow urine easily voided; he was instructed to call or return if he does have difficulty voiding again upon returning home

## 2020-11-09 NOTE — Plan of Care (Signed)

## 2020-11-10 ENCOUNTER — Inpatient Hospital Stay (HOSPITAL_COMMUNITY): Payer: Medicare HMO | Admitting: Anesthesiology

## 2020-11-10 ENCOUNTER — Encounter (HOSPITAL_BASED_OUTPATIENT_CLINIC_OR_DEPARTMENT_OTHER): Payer: Medicare HMO | Admitting: Internal Medicine

## 2020-11-10 ENCOUNTER — Encounter (HOSPITAL_COMMUNITY)
Admission: EM | Disposition: A | Discharge status: Home / Self Care | Payer: Self-pay | Source: Home / Self Care | Attending: Internal Medicine

## 2020-11-10 DIAGNOSIS — N179 Acute kidney failure, unspecified: Secondary | ICD-10-CM | POA: Diagnosis not present

## 2020-11-10 DIAGNOSIS — N1832 Chronic kidney disease, stage 3b: Secondary | ICD-10-CM | POA: Diagnosis not present

## 2020-11-10 DIAGNOSIS — N3040 Irradiation cystitis without hematuria: Secondary | ICD-10-CM | POA: Diagnosis not present

## 2020-11-10 HISTORY — PX: TRANSURETHRAL RESECTION OF BLADDER TUMOR: SHX2575

## 2020-11-10 LAB — URINE CULTURE: Culture: <10000 — AB

## 2020-11-10 LAB — GLUCOSE, CAPILLARY
Glucose-Capillary: 195 mg/dL — ABNORMAL HIGH (ref 70–99)
Glucose-Capillary: 173 mg/dL — ABNORMAL HIGH (ref 70–99)
Glucose-Capillary: 122 mg/dL — ABNORMAL HIGH (ref 70–99)
Glucose-Capillary: 113 mg/dL — ABNORMAL HIGH (ref 70–99)
Glucose-Capillary: 139 mg/dL — ABNORMAL HIGH (ref 70–99)
Glucose-Capillary: 205 mg/dL — ABNORMAL HIGH (ref 70–99)

## 2020-11-10 LAB — CBC WITH DIFFERENTIAL/PLATELET
Abs Immature Granulocytes: 0.11 10*3/uL — ABNORMAL HIGH (ref 0.00–0.07)
Basophils Absolute: 0 10*3/uL (ref 0.0–0.1)
Basophils Relative: 0 %
Eosinophils Absolute: 0.2 10*3/uL (ref 0.0–0.5)
Eosinophils Relative: 1 %
HCT: 32 % — ABNORMAL LOW (ref 39.0–52.0)
Hemoglobin: 10.3 g/dL — ABNORMAL LOW (ref 13.0–17.0)
Immature Granulocytes: 1 %
Lymphocytes Relative: 12 %
Lymphs Abs: 1.9 10*3/uL (ref 0.7–4.0)
MCH: 28.4 pg (ref 26.0–34.0)
MCHC: 32.2 g/dL (ref 30.0–36.0)
MCV: 88.2 fL (ref 80.0–100.0)
Monocytes Absolute: 1.6 10*3/uL — ABNORMAL HIGH (ref 0.1–1.0)
Monocytes Relative: 10 %
Neutro Abs: 11.8 10*3/uL — ABNORMAL HIGH (ref 1.7–7.7)
Neutrophils Relative %: 76 %
Platelets: 225 10*3/uL (ref 150–400)
RBC: 3.63 MIL/uL — ABNORMAL LOW (ref 4.22–5.81)
RDW: 14.7 % (ref 11.5–15.5)
WBC: 15.6 10*3/uL — ABNORMAL HIGH (ref 4.0–10.5)
nRBC: 0 % (ref 0.0–0.2)

## 2020-11-10 LAB — BASIC METABOLIC PANEL
Anion gap: 8 (ref 5–15)
BUN: 30 mg/dL — ABNORMAL HIGH (ref 8–23)
CO2: 22 mmol/L (ref 22–32)
Calcium: 8.9 mg/dL (ref 8.9–10.3)
Chloride: 109 mmol/L (ref 98–111)
Creatinine, Ser: 2.23 mg/dL — ABNORMAL HIGH (ref 0.61–1.24)
GFR, Estimated: 28 mL/min — ABNORMAL LOW (ref >60)
Glucose, Bld: 181 mg/dL — ABNORMAL HIGH (ref 70–99)
Potassium: 4.3 mmol/L (ref 3.5–5.1)
Sodium: 139 mmol/L (ref 135–145)

## 2020-11-10 LAB — MAGNESIUM: Magnesium: 2 mg/dL (ref 1.7–2.4)

## 2020-11-10 SURGERY — TURBT (TRANSURETHRAL RESECTION OF BLADDER TUMOR)
Anesthesia: General | Site: Bladder

## 2020-11-10 MED ORDER — ONDANSETRON HCL 4 MG/2ML IJ SOLN
INTRAMUSCULAR | Status: DC | PRN
Start: 2020-11-10 — End: 2020-11-10
  Administered 2020-11-10: 4 mg via INTRAVENOUS

## 2020-11-10 MED ORDER — MEPERIDINE HCL 50 MG/ML IJ SOLN: 6.2500 mg | INTRAMUSCULAR | Status: DC | PRN

## 2020-11-10 MED ORDER — LIDOCAINE 2% (20 MG/ML) 5 ML SYRINGE
INTRAMUSCULAR | Status: DC | PRN
  Administered 2020-11-10: 80 mg via INTRAVENOUS

## 2020-11-10 MED ORDER — ACETAMINOPHEN 325 MG PO TABS: 325.0000 mg | ORAL_TABLET | ORAL | Status: DC | PRN

## 2020-11-10 MED ORDER — MORPHINE SULFATE (PF) 2 MG/ML IV SOLN
2.0000 mg | INTRAVENOUS | Status: DC | PRN
Start: 2020-11-10 — End: 2020-11-12
  Administered 2020-11-10 – 2020-11-11 (×3): 2 mg via INTRAVENOUS
  Filled 2020-11-10 (×3): qty 1

## 2020-11-10 MED ORDER — OXYCODONE HCL 5 MG PO TABS
5.0000 mg | ORAL_TABLET | Freq: Once | ORAL | Status: DC | PRN
Start: 2020-11-10 — End: 2020-11-10

## 2020-11-10 MED ORDER — SODIUM CHLORIDE 0.9 % IR SOLN
Status: DC | PRN
  Administered 2020-11-10: 3000 mL via INTRAVESICAL
  Administered 2020-11-10: 3000 mL

## 2020-11-10 MED ORDER — LIDOCAINE 2% (20 MG/ML) 5 ML SYRINGE
INTRAMUSCULAR | Status: AC
  Filled 2020-11-10: qty 5

## 2020-11-10 MED ORDER — FENTANYL CITRATE (PF) 100 MCG/2ML IJ SOLN: 25.0000 ug | INTRAMUSCULAR | Status: DC | PRN

## 2020-11-10 MED ORDER — PROPOFOL 10 MG/ML IV BOLUS
INTRAVENOUS | Status: AC
  Filled 2020-11-10: qty 20

## 2020-11-10 MED ORDER — ONDANSETRON HCL 4 MG/2ML IJ SOLN: 4.0000 mg | Freq: Once | INTRAMUSCULAR | Status: DC | PRN

## 2020-11-10 MED ORDER — EPHEDRINE 5 MG/ML INJ
INTRAVENOUS | Status: AC
  Filled 2020-11-10: qty 5

## 2020-11-10 MED ORDER — OXYCODONE HCL 5 MG/5ML PO SOLN: 5.0000 mg | Freq: Once | ORAL | Status: DC | PRN

## 2020-11-10 MED ORDER — CEFAZOLIN SODIUM-DEXTROSE 2-4 GM/100ML-% IV SOLN
2.0000 g | INTRAVENOUS | Status: AC
  Administered 2020-11-10: 2 g via INTRAVENOUS
  Filled 2020-11-10: qty 100

## 2020-11-10 MED ORDER — FENTANYL CITRATE (PF) 100 MCG/2ML IJ SOLN
INTRAMUSCULAR | Status: DC | PRN
  Administered 2020-11-10: 50 ug via INTRAVENOUS

## 2020-11-10 MED ORDER — LACTATED RINGERS IV SOLN: INTRAVENOUS | Status: DC | PRN

## 2020-11-10 MED ORDER — ACETAMINOPHEN 160 MG/5ML PO SOLN: 325.0000 mg | ORAL | Status: DC | PRN

## 2020-11-10 MED ORDER — FENTANYL CITRATE (PF) 250 MCG/5ML IJ SOLN
INTRAMUSCULAR | Status: AC
  Filled 2020-11-10: qty 5

## 2020-11-10 MED ORDER — CHLORHEXIDINE GLUCONATE CLOTH 2 % EX PADS
6.0000 | MEDICATED_PAD | Freq: Every day | CUTANEOUS | Status: DC
  Administered 2020-11-11: 6 via TOPICAL

## 2020-11-10 MED ORDER — MORPHINE SULFATE (PF) 2 MG/ML IV SOLN
1.0000 mg | Freq: Once | INTRAVENOUS | Status: AC
  Administered 2020-11-10: 1 mg via INTRAVENOUS
  Filled 2020-11-10: qty 1

## 2020-11-10 MED ORDER — PROPOFOL 10 MG/ML IV BOLUS
INTRAVENOUS | Status: DC | PRN
  Administered 2020-11-10: 200 mg via INTRAVENOUS

## 2020-11-10 MED ORDER — LACTATED RINGERS IV SOLN: INTRAVENOUS | Status: DC

## 2020-11-10 MED ORDER — EPHEDRINE SULFATE-NACL 50-0.9 MG/10ML-% IV SOSY
PREFILLED_SYRINGE | INTRAVENOUS | Status: DC | PRN
  Administered 2020-11-10: 10 mg via INTRAVENOUS

## 2020-11-10 SURGICAL SUPPLY — 19 items
BAG DRN RND TRDRP ANRFLXCHMBR (UROLOGICAL SUPPLIES)
BAG URINE DRAIN 2000ML AR STRL (UROLOGICAL SUPPLIES) IMPLANT
BAG URO CATCHER STRL LF (MISCELLANEOUS) ×2 IMPLANT
CATH FOLEY 2WAY SLVR  5CC 18FR (CATHETERS)
CATH FOLEY 2WAY SLVR 5CC 18FR (CATHETERS) IMPLANT
CATH FOLEY 3WAY 30CC 22FR (CATHETERS) ×1 IMPLANT
DRAPE FOOT SWITCH (DRAPES) ×2 IMPLANT
ELECT REM PT RETURN 15FT ADLT (MISCELLANEOUS) ×2 IMPLANT
GLOVE SURG ENC MOIS LTX SZ7.5 (GLOVE) ×2 IMPLANT
GOWN STRL REUS W/TWL XL LVL3 (GOWN DISPOSABLE) ×2 IMPLANT
KIT TURNOVER KIT A (KITS) ×2 IMPLANT
LOOP CUT BIPOLAR 24F LRG (ELECTROSURGICAL) ×1 IMPLANT
MANIFOLD NEPTUNE II (INSTRUMENTS) ×2 IMPLANT
PACK CYSTO (CUSTOM PROCEDURE TRAY) ×2 IMPLANT
PLUG CATH AND CAP STER (CATHETERS) IMPLANT
SYR TOOMEY IRRIG 70ML (MISCELLANEOUS)
SYRINGE TOOMEY IRRIG 70ML (MISCELLANEOUS) IMPLANT
TUBING CONNECTING 10 (TUBING) ×2 IMPLANT
TUBING UROLOGY SET (TUBING) ×2 IMPLANT

## 2020-11-10 NOTE — Progress Notes (Signed)
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   Principal Problem:   Cystitis, radiation Active Problems:   CAD S/P percutaneous coronary angioplasty   Insulin dependent type 2 diabetes mellitus, controlled ([place])   Essential hypertension   Dyslipidemia, goal LDL below 70   Hematuria   Acute renal failure superimposed on stage 3b chronic kidney disease (HCC)  Current Facility-Administered Medications  Medication Dose Route Frequency Provider Last Rate Last Admin   acetaminophen (TYLENOL) tablet 650 mg  650 mg Oral Q6H PRN Marylyn Ishihara, Tyrone A, DO   650 mg at 11/09/20 5956   Or   acetaminophen (TYLENOL) suppository 650 mg  650 mg Rectal Q6H PRN Marylyn Ishihara, Tyrone A, DO       amLODipine (NORVASC) tablet 10 mg  10 mg Oral Daily Kyle, Tyrone A, DO   10 mg at 11/10/20 1026   atorvastatin (LIPITOR) tablet 10 mg  10 mg Oral Daily Kyle, Tyrone A, DO   10 mg at 11/09/20 1025   carvedilol (COREG) tablet 25 mg  25 mg Oral BID Marylyn Ishihara, Tyrone A, DO   25 mg at 11/10/20 1026   ceFAZolin (ANCEF) IVPB 2g/100 mL premix  2 g Intravenous On Call to OR Lucas Mallow, MD       Chlorhexidine Gluconate Cloth 2 % PADS 6 each  6 each Topical Daily Dwyane Dee, MD       furosemide (LASIX) tablet 40 mg  40 mg Oral BID Marylyn Ishihara, Tyrone A, DO   40 mg at 11/09/20 1644   insulin aspart (novoLOG) injection 0-15 Units  0-15 Units Subcutaneous TID WC Kyle, Tyrone A, DO   3 Units at 11/10/20 0856   insulin aspart (novoLOG) injection 0-5 Units  0-5 Units Subcutaneous QHS Marylyn Ishihara, Tyrone A, DO       lactated ringers infusion   Intravenous Continuous Janeece Riggers, MD       melatonin tablet 3 mg  3 mg Oral QHS Kathryne Eriksson, NP   3 mg at 11/09/20 2128   morphine 2 MG/ML injection 2 mg  2 mg Intravenous Q3H PRN Dwyane Dee, MD   2 mg at 11/10/20 1106   sodium chloride irrigation 0.9 % 3,000 mL  3,000 mL Irrigation Continuous Kyle, Tyrone A, DO   Stopped at 11/09/20 1137     Objective: Vital: Vitals:   11/09/20 1311 11/09/20 2117 11/10/20 0119 11/10/20 0453  BP: (!) 147/59 (!) 167/70 (!) 139/55 108/86  Pulse: 63 82 79 86  Resp: 16 16 14 14   Temp: 97.9 F (36.6 C) 98.9 F (37.2 C) 98.9 F (37.2 C) 98.5 F (36.9 C)  TempSrc: Oral Oral Oral Oral  SpO2:  99% 100% 100%  Weight: 107 kg     Height: 6' 0.01" (1.829 m)      I/Os: I/O last 3 completed shifts: In: 10390 [P.O.:360; Blood:1020; Other:9010] Out: [place] [[id number]]  Physical Exam:  General: Patient is in no apparent distress Lungs: Normal respiratory effort, chest expands symmetrically. GI: The abdomen is soft and nontender without mass. Foley: 3 way foley with dark wine output on cbi  Ext: lower extremities symmetric  Lab Results: Recent Labs    11/08/20 1204 11/08/20 2103 11/09/20 0130 11/09/20 0429 11/10/20 0523  WBC  7.4  --   --  9.5 15.6*  HGB 7.1*   < > 9.4* 9.5* 10.3*  HCT 23.2*   < > 28.4* 29.4* 32.0*   < > = values in this interval not displayed.   Recent Labs    11/08/20 1204 11/09/20 0429 11/10/20 0523  NA 138 138 139  K 4.1 4.0 4.3  CL 106 110 109  CO2 22 22 22   GLUCOSE 141* 123* 181*  BUN 32* 28* 30*  CREATININE 2.56* 2.10* 2.23*  CALCIUM 8.4* 8.5* 8.9   No results for input(s): LABPT, INR in the last 72 hours. No results for input(s): LABURIN in the last 72 hours. Results for orders placed or performed during the hospital encounter of 11/08/20  Urine Culture     Status: Abnormal   Collection Time: 11/08/20 11:36 AM   Specimen: Urine, Clean Catch  Result Value Ref Range Status   Specimen Description   Final    URINE, CLEAN CATCH Performed at [facility], [place] [address] [place], [place] [address]    Special Requests   Final    NONE Performed at [facility], [place] [address] [place], [place] [address]    Culture (A)  Final    <10,000 COLONIES/mL INSIGNIFICANT GROWTH Performed at [place] [address] [place], [place] [address]    Report Status 11/10/2020 FINAL  Final  Resp Panel by RT-PCR (Flu A&B, Covid) Urine, Clean Catch     Status: None   Collection Time: 11/08/20  3:03 PM   Specimen: Urine, Clean Catch; Nasopharyngeal(NP) swabs in vial transport medium  Result Value Ref Range Status   SARS Coronavirus 2 by RT PCR NEGATIVE NEGATIVE Final    Comment: (NOTE) SARS-CoV-2 target nucleic acids are NOT DETECTED.  The SARS-CoV-2 RNA is generally detectable in upper respiratory specimens during the acute phase of infection. The lowest concentration of SARS-CoV-2 viral copies this assay can detect is 138 copies/mL. A negative result does not preclude SARS-Cov-2 infection and should not be used as the sole basis for treatment or other patient management decisions. A negative result may occur with  improper specimen collection/handling, submission of specimen other than nasopharyngeal swab, presence of viral mutation(s) within the areas targeted by this assay, and inadequate number of viral copies(<138 copies/mL). A negative result must be combined with clinical observations, patient history, and epidemiological information. The expected result is Negative.  Fact Sheet for Patients:  EntrepreneurPulse.com.au  Fact Sheet for Healthcare Providers:  IncredibleEmployment.be  This test is no t yet approved or cleared by the [place] FDA and  has been authorized for detection and/or diagnosis of SARS-CoV-2 by FDA under an Emergency Use Authorization (EUA). This EUA will remain  in effect (meaning this test can be used) for the duration of the COVID-19 declaration under Section  564(b)(1) of the Act, 21 U.S.C.section 360bbb-3(b)(1), unless the authorization is terminated  or revoked sooner.       Influenza A by PCR NEGATIVE NEGATIVE Final   Influenza B by PCR NEGATIVE NEGATIVE Final    Comment: (NOTE) The Xpert Xpress SARS-CoV-2/FLU/RSV plus assay is intended as an aid in the diagnosis of influenza from Nasopharyngeal swab specimens and should not be used as a sole basis for treatment. Nasal washings and aspirates are unacceptable for Xpert Xpress SARS-CoV-2/FLU/RSV testing.  Fact Sheet for Patients: EntrepreneurPulse.com.au  Fact Sheet for Healthcare Providers: IncredibleEmployment.be  This test is not yet approved or cleared by the  [place] Architectural [employer] and has been authorized for detection and/or diagnosis of SARS-CoV-2 by FDA under an Print production planner (EUA). This EUA will remain in effect (meaning this test can be used) for the duration of the COVID-19 declaration under Section 564(b)(1) of the Act, 21 U.S.C. section 360bbb-3(b)(1), unless the authorization is terminated or revoked.  Performed at [facility], [place] [address] [place], [place] [address]     Studies/Results: No results found.  Assessment: Radiation cystitis with hematuria  Plan:  Probably has some persistent clot. Had a rough night with the foley. Will post for cysto/clot evac/fulguration in OR. R/b discussed   Link Snuffer, MD Urology 11/10/2020, 11:09 AM

## 2020-11-10 NOTE — Op Note (Signed)
.  Preoperative diagnosis: bladder tumor  Postoperative diagnosis: Same  Procedure: 1 cystoscopy 2.  Intraoperative fluoroscopy, under one hour, with interpretation 3.  Clot evacuation 4. Transurethral resection of bladder tumor, small  Attending: Rosie Fate  Anesthesia: General  Estimated blood loss: Minimal  Drains: 22 French foley  Specimens: left lateral wall bladder tumor  Antibiotics: ancef  Findings: multiple areas of irritation from foley catheter. 100cc clot in the bladder. 1.5cm papillary left lateral wall tumor with active bleeding.  Ureteral orifices in normal anatomic location. Nor hydronephrosis or filling defects in either collecting system  Indications: Patient is a 87 year old male with a history of gross hematuria which has been worsening on CBI.  After discussing treatment options, they decided proceed with cystoscopy and clot evacuation.  Procedure her in detail: The patient was brought to the operating room and a brief timeout was done to ensure correct patient, correct procedure, correct site.  General anesthesia was administered patient was placed in dorsal lithotomy position.  Their genitalia was then prepped and draped in usual sterile fashion.  A rigid 80 French cystoscope was passed in the urethra and the bladder.  Bladder was inspected and we noted a large amount of clot and a 1.5cm bladder tumor.  the ureteral orifices were in the normal orthotopic locations.  a We then removed the cystoscope and placed a resectoscope into the bladder. We proceeded to remove the large clot burden from the bladder. Once this was complete we turned our attention to the bladder tumor. Using the bipolar resectoscope we removed the bladder tumor down to the base. A subsequent muscle deep biopsy was then taken. Hemostasis was then obtained with electrocautery. We then removed the bladder tumor chips and sent them for pathology. We then re-inspected the bladder and found no  residula bleeding.  the bladder was then drained, a 22 French foley was placed and this concluded the procedure which was well tolerated by patient.  Complications: None  Condition: Stable, extubated, transferred to PACU  Plan: Patient is admitted overnight with continuous bladder irrigation. If their urine is clear tomorrow they will be discharged home and followup in 5 days for foley catheter removal and pathology discussion.

## 2020-11-10 NOTE — Interval H&P Note (Signed)
History and Physical Interval Note:  11/10/2020 4:02 PM  [name]  has presented today for surgery, with the diagnosis of GROSS HEMATURIA.  The various methods of treatment have been discussed with the patient and family. After consideration of risks, benefits and other options for treatment, the patient has consented to  Procedure(s): CYSTOSCOPY, CLOT EVACTUATION,FULGURATION ([name]) as a surgical intervention.  The patient's history has been reviewed, patient examined, no change in status, stable for surgery.  I have reviewed the patient's chart and labs.  Questions were answered to the patient's satisfaction.     [name]

## 2020-11-10 NOTE — Progress Notes (Signed)
Fourth floor urology RN called to unit to assist in foley irrigation. Pts foley was found to have decreased output and patient reported increased bladder pain. RN hand irrigated 5 times each with 82ml of NS. Multiple large clots removed. Foley catheter began to flow without issue. Primary RN told to call if with any other issues.

## 2020-11-10 NOTE — Anesthesia Preprocedure Evaluation (Addendum)
Anesthesia Evaluation  Patient identified by MRN, date of birth, ID band Patient awake    Reviewed: Allergy & Precautions, NPO status , Patient's Chart, lab work & pertinent test results, reviewed documented beta blocker date and time   Airway Mallampati: I  TM Distance: >3 FB Neck ROM: Full    Dental  (+) Edentulous Upper, Edentulous Lower   Pulmonary former smoker,    Pulmonary exam normal breath sounds clear to auscultation       Cardiovascular hypertension, Pt. on medications + CAD and + Cardiac Stents (x1 in 2009)  Normal cardiovascular exam+ Valvular Problems/Murmurs AI and AS  Rhythm:Regular Rate:Normal  ECG: NSR, rate 66  CAD pci to LAD 10/09; stable CAD by cath 05/31/08; myoview 04/11/11- no ishcemia, EF 67%; echo 05/15/09- EF>55%, mod calcification of the aortic valve leaflets  ECHO 6/22 1. Left ventricular ejection fraction, by estimation, is 60 to 65%. The  left ventricle has normal function. The left ventricle has no regional  wall motion abnormalities. There is moderate concentric left ventricular  hypertrophy. Left ventricular  diastolic parameters are consistent with Grade II diastolic dysfunction  (pseudonormalization). Elevated left atrial pressure.  2. Right ventricular systolic function is normal. The right  5. The aortic valve is tricuspid. There is severe calcifcation of the  aortic valve. There is severe thickening of the aortic valve. Aortic valve  regurgitation is moderate. Mild to moderate aortic valve stenosis. Aortic  regurgitation PHT measures 431  msec. Aortic valve mean gradient measures 16.0 mmHg. Aortic valve Vmax  measures 2.74 m/s.    Neuro/Psych negative neurological ROS  negative psych ROS   GI/Hepatic negative GI ROS, Neg liver ROS,   Endo/Other  diabetes, Type 2, Insulin DependentMorbid obesity  Renal/GU Renal disease     Musculoskeletal  (+) Arthritis , Osteoarthritis,     Abdominal (+) + obese,   Peds  Hematology  (+) Blood dyscrasia, anemia , HLD   Anesthesia Other Findings   Reproductive/Obstetrics                            Anesthesia Physical  Anesthesia Plan  ASA: 3 and emergent  Anesthesia Plan: General   Post-op Pain Management:  Regional for Post-op pain   Induction: Intravenous  PONV Risk Score and Plan: 2 and Treatment may vary due to age or medical condition, Ondansetron and Dexamethasone  Airway Management Planned: Oral ETT and LMA  Additional Equipment: None  Intra-op Plan:   Post-operative Plan: Extubation in OR  Informed Consent: I have reviewed the patients History and Physical, chart, labs and discussed the procedure including the risks, benefits and alternatives for the proposed anesthesia with the patient or authorized representative who has indicated his/her understanding and acceptance.     Dental advisory given  Plan Discussed with: CRNA and Anesthesiologist  Anesthesia Plan Comments: (  )       Anesthesia Quick Evaluation

## 2020-11-10 NOTE — Care Management Important Message (Signed)
Important Message  Patient Details IM Letter given to the Patient. Name: DAELAN GATT MRN: [id number] Date of Birth: [birth year]   Medicare Important Message Given:  Yes     Kerin Salen 11/10/2020, 10:28 AM

## 2020-11-10 NOTE — Transfer of Care (Signed)
Immediate Anesthesia Transfer of Care Note  Patient: [name]  Procedure(s) Performed: CYSTOSCOPY, CLOT EVACTUATION,FULGURATION (Bladder)  Patient Location: PACU  Anesthesia Type:General  Level of Consciousness: awake, alert  and oriented  Airway & Oxygen Therapy: Patient Spontanous Breathing and Patient connected to face mask oxygen  Post-op Assessment: Report given to RN and Post -op Vital signs reviewed and stable  Post vital signs: Reviewed and stable  Last Vitals:  Vitals Value Taken Time  BP    Temp    Pulse    Resp 15 11/10/20 1747  SpO2    Vitals shown include unvalidated device data.  Last Pain:  Vitals:   11/10/20 1510  TempSrc: Oral  PainSc:          Complications: No notable events documented.

## 2020-11-10 NOTE — H&P (View-Only) (Signed)
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   Principal Problem:   Cystitis, radiation Active Problems:   CAD S/P percutaneous coronary angioplasty   Insulin dependent type 2 diabetes mellitus, controlled ([place])   Essential hypertension   Dyslipidemia, goal LDL below 70   Hematuria   Acute renal failure superimposed on stage 3b chronic kidney disease (HCC)  Current Facility-Administered Medications  Medication Dose Route Frequency Provider Last Rate Last Admin   acetaminophen (TYLENOL) tablet 650 mg  650 mg Oral Q6H PRN Marylyn Ishihara, Tyrone A, DO   650 mg at 11/09/20 6767   Or   acetaminophen (TYLENOL) suppository 650 mg  650 mg Rectal Q6H PRN Marylyn Ishihara, Tyrone A, DO       amLODipine (NORVASC) tablet 10 mg  10 mg Oral Daily Kyle, Tyrone A, DO   10 mg at 11/10/20 1026   atorvastatin (LIPITOR) tablet 10 mg  10 mg Oral Daily Kyle, Tyrone A, DO   10 mg at 11/09/20 1025   carvedilol (COREG) tablet 25 mg  25 mg Oral BID Marylyn Ishihara, Tyrone A, DO   25 mg at 11/10/20 1026   ceFAZolin (ANCEF) IVPB 2g/100 mL premix  2 g Intravenous On Call to OR Lucas Mallow, MD       Chlorhexidine Gluconate Cloth 2 % PADS 6 each  6 each Topical Daily Dwyane Dee, MD       furosemide (LASIX) tablet 40 mg  40 mg Oral BID Marylyn Ishihara, Tyrone A, DO   40 mg at 11/09/20 1644   insulin aspart (novoLOG) injection 0-15 Units  0-15 Units Subcutaneous TID WC Kyle, Tyrone A, DO   3 Units at 11/10/20 0856   insulin aspart (novoLOG) injection 0-5 Units  0-5 Units Subcutaneous QHS Marylyn Ishihara, Tyrone A, DO       lactated ringers infusion   Intravenous Continuous Janeece Riggers, MD       melatonin tablet 3 mg  3 mg Oral QHS Kathryne Eriksson, NP   3 mg at 11/09/20 2128   morphine 2 MG/ML injection 2 mg  2 mg Intravenous Q3H PRN Dwyane Dee, MD   2 mg at 11/10/20 1106   sodium chloride irrigation 0.9 % 3,000 mL  3,000 mL Irrigation Continuous Kyle, Tyrone A, DO   Stopped at 11/09/20 1137     Objective: Vital: Vitals:   11/09/20 1311 11/09/20 2117 11/10/20 0119 11/10/20 0453  BP: (!) 147/59 (!) 167/70 (!) 139/55 108/86  Pulse: 63 82 79 86  Resp: 16 16 14 14   Temp: 97.9 F (36.6 C) 98.9 F (37.2 C) 98.9 F (37.2 C) 98.5 F (36.9 C)  TempSrc: Oral Oral Oral Oral  SpO2:  99% 100% 100%  Weight: 107 kg     Height: 6' 0.01" (1.829 m)      I/Os: I/O last 3 completed shifts: In: 10390 [P.O.:360; Blood:1020; Other:9010] Out: [place] [[id number]]  Physical Exam:  General: Patient is in no apparent distress Lungs: Normal respiratory effort, chest expands symmetrically. GI: The abdomen is soft and nontender without mass. Foley: 3 way foley with dark wine output on cbi  Ext: lower extremities symmetric  Lab Results: Recent Labs    11/08/20 1204 11/08/20 2103 11/09/20 0130 11/09/20 0429 11/10/20 0523  WBC  7.4  --   --  9.5 15.6*  HGB 7.1*   < > 9.4* 9.5* 10.3*  HCT 23.2*   < > 28.4* 29.4* 32.0*   < > = values in this interval not displayed.   Recent Labs    11/08/20 1204 11/09/20 0429 11/10/20 0523  NA 138 138 139  K 4.1 4.0 4.3  CL 106 110 109  CO2 22 22 22   GLUCOSE 141* 123* 181*  BUN 32* 28* 30*  CREATININE 2.56* 2.10* 2.23*  CALCIUM 8.4* 8.5* 8.9   No results for input(s): LABPT, INR in the last 72 hours. No results for input(s): LABURIN in the last 72 hours. Results for orders placed or performed during the hospital encounter of 11/08/20  Urine Culture     Status: Abnormal   Collection Time: 11/08/20 11:36 AM   Specimen: Urine, Clean Catch  Result Value Ref Range Status   Specimen Description   Final    URINE, CLEAN CATCH Performed at [facility], [place] [address] [place], [place] [address]    Special Requests   Final    NONE Performed at [facility], [place] [address] [place], [place] [address]    Culture (A)  Final    <10,000 COLONIES/mL INSIGNIFICANT GROWTH Performed at [place] [address], [place], [place] [address]    Report Status 11/10/2020 FINAL  Final  Resp Panel by RT-PCR (Flu A&B, Covid) Urine, Clean Catch     Status: None   Collection Time: 11/08/20  3:03 PM   Specimen: Urine, Clean Catch; Nasopharyngeal(NP) swabs in vial transport medium  Result Value Ref Range Status   SARS Coronavirus 2 by RT PCR NEGATIVE NEGATIVE Final    Comment: (NOTE) SARS-CoV-2 target nucleic acids are NOT DETECTED.  The SARS-CoV-2 RNA is generally detectable in upper respiratory specimens during the acute phase of infection. The lowest concentration of SARS-CoV-2 viral copies this assay can detect is 138 copies/mL. A negative result does not preclude SARS-Cov-2 infection and should not be used as the sole basis for treatment or other patient management decisions. A negative result may occur with  improper specimen collection/handling, submission of specimen other than nasopharyngeal swab, presence of viral mutation(s) within the areas targeted by this assay, and inadequate number of viral copies(<138 copies/mL). A negative result must be combined with clinical observations, patient history, and epidemiological information. The expected result is Negative.  Fact Sheet for Patients:  EntrepreneurPulse.com.au  Fact Sheet for Healthcare Providers:  IncredibleEmployment.be  This test is no t yet approved or cleared by the [place] FDA and  has been authorized for detection and/or diagnosis of SARS-CoV-2 by FDA under an Emergency Use Authorization (EUA). This EUA will remain  in effect (meaning this test can be used) for the duration of the COVID-19 declaration under Section  564(b)(1) of the Act, 21 U.S.C.section 360bbb-3(b)(1), unless the authorization is terminated  or revoked sooner.       Influenza A by PCR NEGATIVE NEGATIVE Final   Influenza B by PCR NEGATIVE NEGATIVE Final    Comment: (NOTE) The Xpert Xpress SARS-CoV-2/FLU/RSV plus assay is intended as an aid in the diagnosis of influenza from Nasopharyngeal swab specimens and should not be used as a sole basis for treatment. Nasal washings and aspirates are unacceptable for Xpert Xpress SARS-CoV-2/FLU/RSV testing.  Fact Sheet for Patients: EntrepreneurPulse.com.au  Fact Sheet for Healthcare Providers: IncredibleEmployment.be  This test is not yet approved or cleared by the  [place] Architectural [employer] and has been authorized for detection and/or diagnosis of SARS-CoV-2 by FDA under an Print production planner (EUA). This EUA will remain in effect (meaning this test can be used) for the duration of the COVID-19 declaration under Section 564(b)(1) of the Act, 21 U.S.C. section 360bbb-3(b)(1), unless the authorization is terminated or revoked.  Performed at [facility], [place] [address], [place], [place] [address]     Studies/Results: No results found.  Assessment: Radiation cystitis with hematuria  Plan:  Probably has some persistent clot. Had a rough night with the foley. Will post for cysto/clot evac/fulguration in OR. R/b discussed   Link Snuffer, MD Urology 11/10/2020, 11:09 AM

## 2020-11-10 NOTE — Anesthesia Procedure Notes (Signed)
Procedure Name: LMA Insertion Date/Time: 11/10/2020 5:13 PM Performed by: Lollie Sails, CRNA Pre-anesthesia Checklist: Patient identified, Emergency Drugs available, Suction available, Patient being monitored and Timeout performed Patient Re-evaluated:Patient Re-evaluated prior to induction Oxygen Delivery Method: Circle system utilized Preoxygenation: Pre-oxygenation with 100% oxygen Induction Type: IV induction Ventilation: Mask ventilation without difficulty LMA: LMA inserted LMA Size: 5.0 Number of attempts: 1 Placement Confirmation: positive ETCO2 and breath sounds checked- equal and bilateral Tube secured with: Tape Dental Injury: Teeth and Oropharynx as per pre-operative assessment

## 2020-11-10 NOTE — Progress Notes (Signed)
Inpatient Diabetes Program Recommendations  AACE/ADA: New Consensus Statement on Inpatient Glycemic Control (2015)  Target Ranges:  Prepandial:   less than 140 mg/dL      Peak postprandial:   less than 180 mg/dL (1-2 hours)      Critically ill patients:  140 - 180 mg/dL   Lab Results  Component Value Date   GLUCAP 173 (H) 11/10/2020   HGBA1C 7.5 (H) 11/08/2020    Review of Glycemic Control Results for [name], [name] (MRN [id number]) as of 11/10/2020 12:34  Ref. Range 11/09/2020 08:19 11/09/2020 16:08 11/09/2020 21:20 11/10/2020 07:32 11/10/2020 11:59  Glucose-Capillary Latest Ref Range: 70 - 99 mg/dL 140 (H)  Novolog 2 units 220 (H)  Novolog 5 units 156 (H)   195 (H)  Novolog 3 units 173 (H)   Diabetes history: DM 2 Outpatient Diabetes medications: 70/30 15 units bid Current orders for Inpatient glycemic control:  Novolog 0-15 units tid + hs  A1c 7.5% on 8/3 Creat/Bun: 2.23/30  Inpatient Diabetes Program Recommendations:    Watch trends for now. PO intake looks ok.   If glucose trends continue to increase after meal intake may consider:  Consider Novolog 1-2 units tid meal coverage if eating >50% of meals  Thanks,  Tama Headings RN, MSN, BC-ADM Inpatient Diabetes Coordinator Team Pager [phone] (8a-5p)

## 2020-11-10 NOTE — Progress Notes (Signed)
Progress Note    [name]   NWG:[id number]  DOB: 09-Jun-1933  DOA: 11/08/2020     2  PCP: Robyne Peers, MD  CC: hematuria  Hospital Course: [name] is an 87  yo male with PMH prostate cancer s/p radiation treatment (several years ago), DMII, HTN who presented to the hospital with difficulty urinating at home and found to have hematuria and weakness.  [name] has had ongoing hematuria overall worsening at home.  [name] was finally convinced to come to the ER for further work-up and evaluation.  Hemoglobin on work-up was 7.1 g/dL.  [name] was transfused 2 units PRBC on admission and urology was consulted.  [name] underwent Foley catheter placement and started on CBI.  Interval History:  Had a difficult night with pain and discomfort.  Received a dose of morphine this morning which [name] says helped.  ROS: Constitutional: negative for chills and fevers, Respiratory: negative for cough and sputum, Cardiovascular: negative for chest pain, and Gastrointestinal: negative for abdominal pain  Assessment & Plan: * Cystitis, radiation - s/p prostate cancer treated with radiation therapy - recurrent issue; plan was to start HBO outpatient - continue CBI as per urology - underwent cystoscopy with clot evacuation on 11/10/2020  Hematuria - baseline Hgb 10-12 g/dL - Hgb 7.1 g/dL on admission; s/p 2 units PRBC on 8/3; repeat Hgb on 8/4 is 9.4 g/dL - continue trending H/H and will transfuse further if needed - continue CBI as per urology  -Hemoglobin remained stable today, 10.3 g/dL - CBC in a.m.  Acute renal failure superimposed on stage 3b chronic kidney disease ([place]) - patient has history of CKD3b. Baseline creat ~ 2, eGFR 33 - patient presents with increase in creat >0.3 mg/dL above baseline, creat increase >1.5x baseline presumed to have occurred within past 7 days PTA - creat 2.56 on admission; likely from BOO  - now s/p foley and CBI; trend renal response  -Underwent cystoscopy with clot  evacuation in OR with urology on 11/10/2020  Dyslipidemia, goal LDL below 70 - Continue statin  Essential hypertension - Continue current regimen  Insulin dependent type 2 diabetes mellitus, controlled ([place]) - continue SSI and CBG monitoring    Old records reviewed in assessment of this patient  Antimicrobials:   DVT prophylaxis: SCDs Start: 11/08/20 1626   Code Status:   Code Status: Full Code Family Communication:   Disposition Plan: Status is: Inpatient  Remains inpatient appropriate because:IV treatments appropriate due to intensity of illness or inability to take PO and Inpatient level of care appropriate due to severity of illness  Dispo: The patient is from: Home              Anticipated d/c is to: Home              Patient currently is not medically stable to d/c.   Difficult to place patient No  Risk of unplanned readmission score: Unplanned Admission- Pilot do not use: 19.22   Objective: Blood pressure 135/68, pulse 83, temperature 97.7 F (36.5 C), temperature source Oral, resp. rate 18, height 6' 0.01" (1.829 m), weight 107 kg, SpO2 96 %.  Examination: General appearance: alert, cooperative, and no distress Head: Normocephalic, without obvious abnormality, atraumatic Eyes:  EOMI Lungs: clear to auscultation bilaterally Heart: regular rate and rhythm and S1, S2 normal Abdomen: normal findings: bowel sounds normal and soft, non-tender GU: hematuria noted in foley bag  Extremities:  no edema Skin: mobility and turgor normal Neurologic: Grossly normal  Consultants:  Urology  Procedures:    Data Reviewed: I have personally reviewed following labs and imaging studies Results for orders placed or performed during the hospital encounter of 11/08/20 (from the past 24 hour(s))  Glucose, capillary     Status: Abnormal   Collection Time: 11/09/20  4:08 PM  Result Value Ref Range   Glucose-Capillary 220 (H) 70 - 99 mg/dL  Glucose, capillary     Status:  Abnormal   Collection Time: 11/09/20  9:20 PM  Result Value Ref Range   Glucose-Capillary 156 (H) 70 - 99 mg/dL  Basic metabolic panel     Status: Abnormal   Collection Time: 11/10/20  5:23 AM  Result Value Ref Range   Sodium 139 135 - 145 mmol/L   Potassium 4.3 3.5 - 5.1 mmol/L   Chloride 109 98 - 111 mmol/L   CO2 22 22 - 32 mmol/L   Glucose, Bld 181 (H) 70 - 99 mg/dL   BUN 30 (H) 8 - 23 mg/dL   Creatinine, Ser 2.23 (H) 0.61 - 1.24 mg/dL   Calcium 8.9 8.9 - 10.3 mg/dL   GFR, Estimated 28 (L) >60 mL/min   Anion gap 8 5 - 15  CBC with Differential/Platelet     Status: Abnormal   Collection Time: 11/10/20  5:23 AM  Result Value Ref Range   WBC 15.6 (H) 4.0 - 10.5 K/uL   RBC 3.63 (L) 4.22 - 5.81 MIL/uL   Hemoglobin 10.3 (L) 13.0 - 17.0 g/dL   HCT 32.0 (L) 39.0 - 52.0 %   MCV 88.2 80.0 - 100.0 fL   MCH 28.4 26.0 - 34.0 pg   MCHC 32.2 30.0 - 36.0 g/dL   RDW 14.7 11.5 - 15.5 %   Platelets 225 150 - 400 K/uL   nRBC 0.0 0.0 - 0.2 %   Neutrophils Relative % 76 %   Neutro Abs 11.8 (H) 1.7 - 7.7 K/uL   Lymphocytes Relative 12 %   Lymphs Abs 1.9 0.7 - 4.0 K/uL   Monocytes Relative 10 %   Monocytes Absolute 1.6 (H) 0.1 - 1.0 K/uL   Eosinophils Relative 1 %   Eosinophils Absolute 0.2 0.0 - 0.5 K/uL   Basophils Relative 0 %   Basophils Absolute 0.0 0.0 - 0.1 K/uL   Immature Granulocytes 1 %   Abs Immature Granulocytes 0.11 (H) 0.00 - 0.07 K/uL  Magnesium     Status: None   Collection Time: 11/10/20  5:23 AM  Result Value Ref Range   Magnesium 2.0 1.7 - 2.4 mg/dL  Glucose, capillary     Status: Abnormal   Collection Time: 11/10/20  7:32 AM  Result Value Ref Range   Glucose-Capillary 195 (H) 70 - 99 mg/dL   Comment 1 Notify RN    Comment 2 Document in Chart   Glucose, capillary     Status: Abnormal   Collection Time: 11/10/20 11:59 AM  Result Value Ref Range   Glucose-Capillary 173 (H) 70 - 99 mg/dL   Comment 1 Notify RN    Comment 2 Document in Chart     Recent Results  (from the past 240 hour(s))  Urine Culture     Status: Abnormal   Collection Time: 11/08/20 11:36 AM   Specimen: Urine, Clean Catch  Result Value Ref Range Status   Specimen Description   Final    URINE, CLEAN CATCH Performed at [facility], [place] [address], [place], [place] [address]    Special Requests  Final    NONE Performed at [facility], [place] [address], [place], [place] [address]    Culture (A)  Final    <10,000 COLONIES/mL INSIGNIFICANT GROWTH Performed at [facility] [address] [place], [place] [address]    Report Status 11/10/2020 FINAL  Final  Resp Panel by RT-PCR (Flu A&B, Covid) Urine, Clean Catch     Status: None   Collection Time: 11/08/20  3:03 PM   Specimen: Urine, Clean Catch; Nasopharyngeal(NP) swabs in vial transport medium  Result Value Ref Range Status   SARS Coronavirus 2 by RT PCR NEGATIVE NEGATIVE Final    Comment: (NOTE) SARS-CoV-2 target nucleic acids are NOT DETECTED.  The SARS-CoV-2 RNA is generally detectable in upper respiratory specimens during the acute phase of infection. The lowest concentration of SARS-CoV-2 viral copies this assay can detect is 138 copies/mL. A negative result does not preclude SARS-Cov-2 infection and should not be used as the sole basis for treatment or other patient management decisions. A negative result may occur with  improper specimen collection/handling, submission of specimen other than nasopharyngeal swab, presence of viral mutation(s) within the areas targeted by this assay, and inadequate number of viral copies(<138 copies/mL). A negative result must be combined with clinical observations, patient history, and epidemiological information. The expected result is Negative.  Fact Sheet for Patients:  EntrepreneurPulse.com.au  Fact Sheet for Healthcare Providers:  IncredibleEmployment.be  This test is no t yet approved or  cleared by the [place] FDA and  has been authorized for detection and/or diagnosis of SARS-CoV-2 by FDA under an Emergency Use Authorization (EUA). This EUA will remain  in effect (meaning this test can be used) for the duration of the COVID-19 declaration under Section 564(b)(1) of the Act, 21 U.S.C.section 360bbb-3(b)(1), unless the authorization is terminated  or revoked sooner.       Influenza A by PCR NEGATIVE NEGATIVE Final   Influenza B by PCR NEGATIVE NEGATIVE Final    Comment: (NOTE) The Xpert Xpress SARS-CoV-2/FLU/RSV plus assay is intended as an aid in the diagnosis of influenza from Nasopharyngeal swab specimens and should not be used as a sole basis for treatment. Nasal washings and aspirates are unacceptable for Xpert Xpress SARS-CoV-2/FLU/RSV testing.  Fact Sheet for Patients: EntrepreneurPulse.com.au  Fact Sheet for Healthcare Providers: IncredibleEmployment.be  This test is not yet approved or cleared by the [place] FDA and has been authorized for detection and/or diagnosis of SARS-CoV-2 by FDA under an Emergency Use Authorization (EUA). This EUA will remain in effect (meaning this test can be used) for the duration of the COVID-19 declaration under Section 564(b)(1) of the Act, 21 U.S.C. section 360bbb-3(b)(1), unless the authorization is terminated or revoked.  Performed at [facility], [place] [address], [place], [place] [address]      Radiology Studies: No results found. No orders to display    Scheduled Meds:  amLODipine  10 mg Oral Daily   atorvastatin  10 mg Oral Daily   carvedilol  25 mg Oral BID   Chlorhexidine Gluconate Cloth  6 each Topical Daily   furosemide  40 mg Oral BID   insulin aspart  0-15 Units Subcutaneous TID WC   insulin aspart  0-5 Units Subcutaneous QHS   melatonin  3 mg Oral QHS   PRN Meds: acetaminophen **OR** acetaminophen, morphine injection Continuous  Infusions:   ceFAZolin (ANCEF) IV     lactated ringers     sodium chloride irrigation Stopped (11/09/20  1137)     LOS: 2 days  Time spent: Greater than 50% of the 35 minute visit was spent in counseling/coordination of care for the patient as laid out in the A&P.   Dwyane Dee, MD [facility] 11/10/2020, 3:44 PM

## 2020-11-11 DIAGNOSIS — R310 Gross hematuria: Secondary | ICD-10-CM

## 2020-11-11 DIAGNOSIS — N3040 Irradiation cystitis without hematuria: Secondary | ICD-10-CM | POA: Diagnosis not present

## 2020-11-11 DIAGNOSIS — N179 Acute kidney failure, unspecified: Secondary | ICD-10-CM | POA: Diagnosis not present

## 2020-11-11 DIAGNOSIS — N1832 Chronic kidney disease, stage 3b: Secondary | ICD-10-CM | POA: Diagnosis not present

## 2020-11-11 LAB — CBC WITH DIFFERENTIAL/PLATELET
Abs Immature Granulocytes: 0.1 10*3/uL — ABNORMAL HIGH (ref 0.00–0.07)
Basophils Absolute: 0 10*3/uL (ref 0.0–0.1)
Basophils Relative: 0 %
Eosinophils Absolute: 0.2 10*3/uL (ref 0.0–0.5)
Eosinophils Relative: 2 %
HCT: 27.7 % — ABNORMAL LOW (ref 39.0–52.0)
Hemoglobin: 8.8 g/dL — ABNORMAL LOW (ref 13.0–17.0)
Immature Granulocytes: 1 %
Lymphocytes Relative: 8 %
Lymphs Abs: 1 10*3/uL (ref 0.7–4.0)
MCH: 28.5 pg (ref 26.0–34.0)
MCHC: 31.8 g/dL (ref 30.0–36.0)
MCV: 89.6 fL (ref 80.0–100.0)
Monocytes Absolute: 1.3 10*3/uL — ABNORMAL HIGH (ref 0.1–1.0)
Monocytes Relative: 11 %
Neutro Abs: 9.3 10*3/uL — ABNORMAL HIGH (ref 1.7–7.7)
Neutrophils Relative %: 78 %
Platelets: 175 10*3/uL (ref 150–400)
RBC: 3.09 MIL/uL — ABNORMAL LOW (ref 4.22–5.81)
RDW: 14.9 % (ref 11.5–15.5)
WBC: 11.9 10*3/uL — ABNORMAL HIGH (ref 4.0–10.5)
nRBC: 0 % (ref 0.0–0.2)

## 2020-11-11 LAB — GLUCOSE, CAPILLARY
Glucose-Capillary: 133 mg/dL — ABNORMAL HIGH (ref 70–99)
Glucose-Capillary: 167 mg/dL — ABNORMAL HIGH (ref 70–99)
Glucose-Capillary: 185 mg/dL — ABNORMAL HIGH (ref 70–99)
Glucose-Capillary: 161 mg/dL — ABNORMAL HIGH (ref 70–99)

## 2020-11-11 LAB — BASIC METABOLIC PANEL
Anion gap: 7 (ref 5–15)
BUN: 32 mg/dL — ABNORMAL HIGH (ref 8–23)
CO2: 22 mmol/L (ref 22–32)
Calcium: 8.2 mg/dL — ABNORMAL LOW (ref 8.9–10.3)
Chloride: 107 mmol/L (ref 98–111)
Creatinine, Ser: 2.27 mg/dL — ABNORMAL HIGH (ref 0.61–1.24)
GFR, Estimated: 28 mL/min — ABNORMAL LOW (ref >60)
Glucose, Bld: 137 mg/dL — ABNORMAL HIGH (ref 70–99)
Potassium: 3.9 mmol/L (ref 3.5–5.1)
Sodium: 136 mmol/L (ref 135–145)

## 2020-11-11 LAB — MAGNESIUM: Magnesium: 2.1 mg/dL (ref 1.7–2.4)

## 2020-11-11 NOTE — Progress Notes (Signed)
1 Day Post-Op Subjective: Patient reports good pain control. Urine clear off CBI.  Objective: Vital signs in last 24 hours: Temp:  [98.1 F (36.7 C)-99.6 F (37.6 C)] 98.1 F (36.7 C) (08/06 1310) Pulse Rate:  [74-84] 74 (08/06 1310) Resp:  [14-19] 15 (08/06 1310) BP: (117-165)/(57-68) 126/66 (08/06 1310) SpO2:  [93 %-100 %] 95 % (08/06 1310)  Intake/Output from previous day: 08/05 0701 - 08/06 0700 In: 59563 [I.V.:520; IV Piggyback:100] Out: 13700 [Urine:13700] Intake/Output this shift: Total I/O In: 720 [P.O.:720] Out: 2500 [Urine:2500]  Physical Exam:  General:alert, cooperative, and appears stated age GI: soft, non tender, normal bowel sounds, no palpable masses, no organomegaly, no inguinal hernia Male genitalia: not done Extremities: extremities normal, atraumatic, no cyanosis or edema  Lab Results: Recent Labs    11/09/20 0429 11/10/20 0523 11/11/20 0703  HGB 9.5* 10.3* 8.8*  HCT 29.4* 32.0* 27.7*   BMET Recent Labs    11/10/20 0523 11/11/20 0703  NA 139 136  K 4.3 3.9  CL 109 107  CO2 22 22  GLUCOSE 181* 137*  BUN 30* 32*  CREATININE 2.23* 2.27*  CALCIUM 8.9 8.2*   No results for input(s): LABPT, INR in the last 72 hours. No results for input(s): LABURIN in the last 72 hours. Results for orders placed or performed during the hospital encounter of 11/08/20  Urine Culture     Status: Abnormal   Collection Time: 11/08/20 11:36 AM   Specimen: Urine, Clean Catch  Result Value Ref Range Status   Specimen Description   Final    URINE, CLEAN CATCH Performed at [facility], [place] [address] [place], [place] [address]    Special Requests   Final    NONE Performed at [facility], [place] [address] [place], [place] [address]    Culture (A)  Final    <10,000 COLONIES/mL INSIGNIFICANT GROWTH Performed at [place] [address], [place], [place] [address]    Report Status 11/10/2020 FINAL  Final  Resp  Panel by RT-PCR (Flu A&B, Covid) Urine, Clean Catch     Status: None   Collection Time: 11/08/20  3:03 PM   Specimen: Urine, Clean Catch; Nasopharyngeal(NP) swabs in vial transport medium  Result Value Ref Range Status   SARS Coronavirus 2 by RT PCR NEGATIVE NEGATIVE Final    Comment: (NOTE) SARS-CoV-2 target nucleic acids are NOT DETECTED.  The SARS-CoV-2 RNA is generally detectable in upper respiratory specimens during the acute phase of infection. The lowest concentration of SARS-CoV-2 viral copies this assay can detect is 138 copies/mL. A negative result does not preclude SARS-Cov-2 infection and should not be used as the sole basis for treatment or other patient management decisions. A negative result may occur with  improper specimen collection/handling, submission of specimen other than nasopharyngeal swab, presence of viral mutation(s) within the areas targeted by this assay, and inadequate number of viral copies(<138 copies/mL). A negative result must be combined with clinical observations, patient history, and epidemiological information. The expected result is Negative.  Fact Sheet for Patients:  EntrepreneurPulse.com.au  Fact Sheet for Healthcare Providers:  IncredibleEmployment.be  This test is no t yet approved or cleared by the [place] FDA and  has been authorized for detection and/or diagnosis of SARS-CoV-2 by FDA under an Emergency Use Authorization (EUA). This EUA will remain  in effect (meaning this test can be used) for the duration of the COVID-19 declaration under Section 564(b)(1) of the Act, 21 U.S.C.section  360bbb-3(b)(1), unless the authorization is terminated  or revoked sooner.       Influenza A by PCR NEGATIVE NEGATIVE Final   Influenza B by PCR NEGATIVE NEGATIVE Final    Comment: (NOTE) The Xpert Xpress SARS-CoV-2/FLU/RSV plus assay is intended as an aid in the diagnosis of influenza from Nasopharyngeal  swab specimens and should not be used as a sole basis for treatment. Nasal washings and aspirates are unacceptable for Xpert Xpress SARS-CoV-2/FLU/RSV testing.  Fact Sheet for Patients: EntrepreneurPulse.com.au  Fact Sheet for Healthcare Providers: IncredibleEmployment.be  This test is not yet approved or cleared by the [place] FDA and has been authorized for detection and/or diagnosis of SARS-CoV-2 by FDA under an Emergency Use Authorization (EUA). This EUA will remain in effect (meaning this test can be used) for the duration of the COVID-19 declaration under Section 564(b)(1) of the Act, 21 U.S.C. section 360bbb-3(b)(1), unless the authorization is terminated or revoked.  Performed at [facility], [place] [address] [place], [place] [address]     Studies/Results: No results found.  Assessment/Plan: POD#1 cystoscopy with clot evacuation and fulgeration Discontinue CBI Voiding trial Sunday morning and discharge home from urology standpoint after he passes his voiding trial   LOS: 3 days   [name] 11/11/2020, 3:31 PM

## 2020-11-11 NOTE — Progress Notes (Signed)
[place], Skippy L. ([id number]) Visit Report for 11/07/2020 Chief Complaint Document Details Patient Name: Date of Service: [name], [name] 11/07/2020 1:15 PM Medical Record Number: [id number] Patient Account Number: [id number] Date of Birth/Sex: Treating RN: 02/17/1934 (87 y.o. 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 [name], [name] Primary Care Provider: Merri Acosta Other Clinician: Referring Provider: Treating Provider/Extender: [name] in Treatment: 0 History of Present Illness HPI Description: [name] is an 87 year old male with a past medical history of prostate cancer status post radiation and insulin-dependent type 2 diabetes that presents today for evaluation of HBO for radiation cystitis. He states that he had a cystoscopy that showed radiation cystitis and he was referred to our clinic by his Urologist. He reports a 1 month history of hematuria with clots. Electronic Signature(s) Signed: 11/10/2020 11:52:01 PM By: Robert Shan DO Previous Signature: 11/10/2020 11:04:04 PM Version By: Robert Shan DO Entered By: [name] on 11/10/2020 23:52:01 -------------------------------------------------------------------------------- Physical Exam  Details Patient Name: Date of Service: [name], [name] 11/07/2020 1:15 PM Medical Record Number: [id number] Patient Account Number: [id number] Date of Birth/Sex: Treating RN: December 25, 1933 (87 y.o. [name] Primary Care Provider: Merri Acosta Other Clinician: Referring Provider: Treating Provider/Extender: [name] in Treatment: 0 Respiratory normal breathing without difficulty. clear to auscultation bilaterally. Cardiovascular regular rate and rhythm with normal S1, S2. Psychiatric pleasant and cooperative. Electronic Signature(s) Signed: 11/10/2020 11:52:11 PM By: Robert Shan DO Entered By: [name] on 11/10/2020 23:52:11 -------------------------------------------------------------------------------- Physician Orders Details Patient Name: Date of Service: [name], [name] 11/07/2020 1:15 PM Medical Record Number: [id number] Patient Account Number: [id number] Date of Birth/Sex: Treating RN: 12-Nov-1933 (87 y.o. [name], [name] Primary Care Provider: Merri Acosta Other Clinician: Referring Provider: Treating Provider/Extender: [name] in Treatment: 0 Verbal / Phone Orders: No Diagnosis Coding ICD-10 Coding Code Description N30.41 Irradiation cystitis with hematuria C61 Malignant neoplasm of prostate E11.9 Type 2 diabetes mellitus without complications Follow-up Appointments Return Appointment in 2 weeks. Hyperbaric Oxygen Therapy Evaluate for HBO Therapy Indication: - Radiation cystitis If appropriate for treatment, begin HBOT per protocol: 2.5 ATA for 90 Minutes with 2 Five (5) Minute A Breaks ir Total Number of Treatments: - 40 One treatments per day (delivered Monday through Friday unless otherwise specified in Special Instructions below): Finger stick Blood Glucose Pre- and Post- HBOT Treatment. Follow Hyperbaric Oxygen Glycemia Protocol A frin (Oxymetazoline HCL) 0.05%  nasal spray - 1 spray in both nostrils daily as needed prior to HBO treatment for difficulty clearing ears Radiology X-Acosta, Chest GLYCEMIA INTERVENTIONS PROTOCOL PRE-HBO GLYCEMIA INTERVENTIONS ACTION INTERVENTION Obtain pre-HBO capillary blood glucose (ensure 1 physician order is in chart). A. Notify HBO physician and await physician orders. 2 If result is 70 mg/dl or below: B. If the result meets the hospital definition of a critical result, follow hospital policy. A. Give patient an 8 ounce Glucerna Shake, an 8 ounce Ensure, or 8 ounces of a Glucerna/Ensure equivalent dietary supplement*. B. Wait 30 minutes. If result is 64  mg/dl to 130 mg/dl: C. Retest patients capillary blood glucose (CBG). D. If result greater than or equal to 110 mg/dl, proceed with HBO. If result less than 110 mg/dl, notify HBO physician and consider holding HBO. If result is 131 mg/dl to 249 mg/dl: A. Proceed with HBO. A. Notify HBO physician and await physician orders. B. It is recommended to hold HBO and do If result is 250 mg/dl or greater: blood/urine ketone testing. C. If the result meets the hospital definition of a critical result, follow hospital policy. POST-HBO GLYCEMIA INTERVENTIONS ACTION INTERVENTION Obtain post HBO capillary blood glucose (ensure 1 physician order is in chart). A. Notify HBO physician and await physician orders. 2 If result is 70 mg/dl or below: B. If the result meets the hospital definition of a critical result, follow hospital policy. A. Give patient an 8 ounce Glucerna Shake, an 8 ounce Ensure, or 8 ounces of a Glucerna/Ensure equivalent dietary supplement*. B. Wait 15 minutes for symptoms of If result is 71 mg/dl to 100 mg/dl: hypoglycemia (i.e. nervousness, anxiety, sweating, chills, clamminess, irritability, confusion, tachycardia or dizziness). C. If patient asymptomatic, discharge patient. If patient symptomatic, repeat capillary blood glucose  (CBG) and notify HBO physician. If result is 101 mg/dl to 249 mg/dl: A. Discharge patient. A. Notify HBO physician and await physician orders. B. It is recommended to do blood/urine ketone If result is 250 mg/dl or greater: testing. C. If the result meets the hospital definition of a critical result, follow hospital policy. *Juice or candies are NOT equivalent products. If patient refuses the Glucerna or Ensure, please consult the hospital dietitian for an appropriate substitute. Electronic Signature(s) Signed: 11/10/2020 11:52:26 PM By: Robert Shan DO Previous Signature: 11/10/2020 11:45:22 PM Version By: Robert Shan DO Previous Signature: 11/07/2020 2:59:13 PM Version By: Lorrin Jackson Entered By: [name] on 11/10/2020 23:52:26 Prescription 11/07/2020 -------------------------------------------------------------------------------- [name], Robert L. Robert Shan DO Patient Name: Provider: 1933-10-08 [id number] Date of Birth: NPI#: Jerilynn Mages [id number] Sex: DEA #: [phone] Phone #: License #: [place] Patient Address: [place] [address] [place], [place] [address] [place],  [address] [phone] Allergies No Known Allergies Provider's Orders X-Acosta, Chest Hand Signature: Date(s): Electronic Signature(s) Signed: 11/10/2020 11:55:14 PM By: Robert Shan DO Previous Signature: 11/07/2020 5:26:44 PM Version By: Lorrin Jackson Entered By: [name] on 11/10/2020 23:52:26 -------------------------------------------------------------------------------- Problem List Details Patient Name: Date of Service: [name], [name] 11/07/2020 1:15 PM Medical Record Number: [id number] Patient Account Number: [id number] Date of Birth/Sex: Treating RN: [birth year] (87 y.o. [name], [name] Primary Care Provider: Merri Acosta Other Clinician: Referring Provider: Treating  Provider/Extender: [name] in Treatment: 0 Active Problems ICD-10 Encounter Code Description Active Date MDM Diagnosis N30.41 Irradiation cystitis with hematuria 11/07/2020 No Yes C61 Malignant neoplasm of prostate 11/07/2020 No Yes E11.9 Type 2 diabetes mellitus without complications 07/07/9377 No Yes Inactive Problems Resolved Problems Electronic Signature(s) Signed: 11/10/2020 11:51:21 PM By: Robert Shan DO Entered By: [name] on 11/10/2020 23:51:21 -------------------------------------------------------------------------------- Progress Note Details Patient Name: Date of Service: AYDAN, LEVITZ 11/07/2020 1:15 PM Medical Record Number: [id number] Patient Account Number: [id number] Date of Birth/Sex: Treating RN: 1933/09/13 (87 y.o. 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 Vikas [name] is  an 87 year old male with a past medical history of prostate cancer status post radiation and insulin-dependent type 2 diabetes that presents today for evaluation of HBO for radiation cystitis. He states that he had a cystoscopy that showed radiation cystitis and he was referred to our clinic by his Urologist. He reports a 1 month history of hematuria with clots. Patient History Information obtained from Patient. Allergies No Known Allergies Family History Cancer - Siblings, Diabetes - Mother,Siblings, No family history of Heart Disease, Hereditary Spherocytosis, Hypertension, Kidney Disease, Lung Disease, Seizures, Stroke, Thyroid Problems, Tuberculosis. Social History Former smoker - quit 7 yr ago, Marital Status - Married,  Alcohol Use - Never, Drug Use - No History, Caffeine Use - Daily - coffee. Medical History Eyes Denies history of Cataracts, Glaucoma Respiratory Denies history of Aspiration, Asthma, Chronic Obstructive Pulmonary Disease (COPD), Pneumothorax, Sleep Apnea, Tuberculosis Cardiovascular Patient has history of Congestive Heart Failure, Coronary Artery Disease, Deep Vein Thrombosis - age 41, Hypertension Denies history of Arrhythmia, Hypotension, Myocardial Infarction, Peripheral Arterial Disease, Peripheral Venous Disease, Phlebitis, Vasculitis Endocrine Patient has history of Type II Diabetes - 17 yrs Denies history of Type I Diabetes Musculoskeletal Patient has history of Osteoarthritis Neurologic Denies history of Neuropathy Oncologic Patient has history of Received Radiation - 2011 [place] Denies history of Received Chemotherapy Psychiatric Denies history of Anorexia/bulimia, Confinement Anxiety Patient is treated with Insulin. Blood sugar is not tested. Hospitalization/Surgery History - cardiac stent placement. - cholecystectomy. - prostate surgery. - enteroscopic polypectomy. - knee surgery. - cystoscopy. - TURBT . Medical A Surgical History Notes nd Constitutional Symptoms (General Health) general weakness Cardiovascular hypercholesteremia Genitourinary erectile dysfunction, hydrocele, gross hematuria Oncologic prostate cancer Review of Systems (ROS) Constitutional Symptoms (General Health) Complains or has symptoms of Fatigue, Chills - occaissional. Denies complaints or symptoms of Fever, Marked Weight Change. Eyes Complains or has symptoms of Glasses / Contacts. Denies complaints or symptoms of Dry Eyes, Vision Changes. Ear/Nose/Mouth/Throat Denies complaints or symptoms of Chronic sinus problems or rhinitis. Respiratory Complains or has symptoms of Shortness of Breath - recently. Denies complaints or symptoms of Chronic or frequent  coughs. Cardiovascular Denies complaints or symptoms of Chest pain. Gastrointestinal Denies complaints or symptoms of Frequent diarrhea, Nausea, Vomiting. Endocrine Denies complaints or symptoms of Heat/cold intolerance. Genitourinary Complains or has symptoms of Frequent urination. Integumentary (Skin) Denies complaints or symptoms of Wounds. Musculoskeletal Denies complaints or symptoms of Muscle Pain, Muscle Weakness. Neurologic Denies complaints or symptoms of Numbness/parasthesias. Psychiatric Denies complaints or symptoms of Claustrophobia, Suicidal. Objective Constitutional Vitals Time Taken: 1:48 PM, Height: 72 in, Source: Stated, Weight: 236 lbs, Source: Stated, BMI: 32, Temperature: 98.5 F, Pulse: 74 bpm, Respiratory Rate: 18 breaths/min, Blood Pressure: 127/65 mmHg. General Notes: pt does not check blood sugars daily Respiratory normal breathing without difficulty. clear to auscultation bilaterally. Cardiovascular regular rate and rhythm with normal S1, S2. Psychiatric pleasant and cooperative. Assessment Active Problems ICD-10 Irradiation cystitis with hematuria Malignant neoplasm of prostate Type 2 diabetes mellitus without complications Patient presents for HBO evaluation of radiation cystitis. He had a cystoscopy done by Dr. Gloriann Loan at [facility] in July 2022 that showed some hypervascularity consistent with radiation changes. Patient has a history of prostate cancer and had ADT+ EBRT plus brachy treatment boost with last ADT injection in 08/2009. Per review of EMR it appears that he had radiation treatment in 2009 by Dr. Kyung Rudd at the [facility] We discussed HBO treatment including risks and benefits. We will obtain a chest x-Acosta.  He had an echo that showed normal EF with moderate AI and mild to moderate AS. He would do 40 treatments at 2.5ATA for 90 min with two 5 min air breaks. Throughout treatment we are looking for a decrease in  hematuria with hopeful resolution. 50 min was spent on the Encounter including face to face and EMR review Plan Follow-up Appointments: Return Appointment in 2 weeks. Hyperbaric Oxygen Therapy: Evaluate for HBO Therapy Indication: - Radiation cystitis If appropriate for treatment, begin HBOT per protocol: 2.5 ATA for 90 Minutes with 2 Five (5) Minute Air Breaks T Number of Treatments: - 40 otal One treatments per day (delivered Monday through Friday unless otherwise specified in Special Instructions below): Finger stick Blood Glucose Pre- and Post- HBOT Treatment. Follow Hyperbaric Oxygen Glycemia Protocol Afrin (Oxymetazoline HCL) 0.05% nasal spray - 1 spray in both nostrils daily as needed prior to HBO treatment for difficulty clearing ears Radiology ordered were: X-Acosta, Chest 1. Chest x-Acosta 2. Send approval for HBO Electronic Signature(s) Signed: 11/10/2020 11:52:47 PM By: Robert Shan DO Previous Signature: 11/10/2020 11:49:02 PM Version By: Robert Shan DO Previous Signature: 11/10/2020 11:46:09 PM Version By: Robert Shan DO Previous Signature: 11/10/2020 11:44:08 PM Version By: Robert Shan DO Previous Signature: 11/10/2020 11:14:33 PM Version By: Robert Shan DO Entered By: [name] on 11/10/2020 23:52:47 -------------------------------------------------------------------------------- HxROS Details Patient Name: Date of Service: [name], [name] 11/07/2020 1:15 PM Medical Record Number: [id number] Patient Account Number: [id number] Date of Birth/Sex: Treating RN: [birth year] (87 y.o. [name] Primary Care Provider: Other Clinician: Merri Acosta Referring Provider: Treating Provider/Extender: [name] in Treatment: 0 Information Obtained From Patient Constitutional Symptoms (Marie) Complaints and Symptoms: Positive for: Fatigue; Chills - occaissional Negative for: Fever; Marked Weight  Change Medical History: Past Medical History Notes: general weakness Eyes Complaints and Symptoms: Positive for: Glasses / Contacts Negative for: Dry Eyes; Vision Changes Medical History: Negative for: Cataracts; Glaucoma Ear/Nose/Mouth/Throat Complaints and Symptoms: Negative for: Chronic sinus problems or rhinitis Respiratory Complaints and Symptoms: Positive for: Shortness of Breath - recently Negative for: Chronic or frequent coughs Medical History: Negative for: Aspiration; Asthma; Chronic Obstructive Pulmonary Disease (COPD); Pneumothorax; Sleep Apnea; Tuberculosis Cardiovascular Complaints and Symptoms: Negative for: Chest pain Medical History: Positive for: Congestive Heart Failure; Coronary Artery Disease; Deep Vein Thrombosis - age 32; Hypertension Negative for: Arrhythmia; Hypotension; Myocardial Infarction; Peripheral Arterial Disease; Peripheral Venous Disease; Phlebitis; Vasculitis Past Medical History Notes: hypercholesteremia Gastrointestinal Complaints and Symptoms: Negative for: Frequent diarrhea; Nausea; Vomiting Endocrine Complaints and Symptoms: Negative for: Heat/cold intolerance Medical History: Positive for: Type II Diabetes - 17 yrs Negative for: Type I Diabetes Time with diabetes: 17 yrs Treated with: Insulin Blood sugar tested every day: No Genitourinary Complaints and Symptoms: Positive for: Frequent urination Medical History: Past Medical History Notes: erectile dysfunction, hydrocele, gross hematuria Integumentary (Skin) Complaints and Symptoms: Negative for: Wounds Musculoskeletal Complaints and Symptoms: Negative for: Muscle Pain; Muscle Weakness Medical History: Positive for: Osteoarthritis Neurologic Complaints and Symptoms: Negative for: Numbness/parasthesias Medical History: Negative for: Neuropathy Psychiatric Complaints and Symptoms: Negative for: Claustrophobia; Suicidal Medical History: Negative for:  Anorexia/bulimia; Confinement Anxiety Hematologic/Lymphatic Immunological Oncologic Medical History: Positive for: Received Radiation - 2011 [place] Negative for: Received Chemotherapy Past Medical History Notes: prostate cancer Immunizations Pneumococcal Vaccine: Received Pneumococcal Vaccination: Yes Received Pneumococcal Vaccination On or After 60th Birthday: Yes Implantable Devices Yes Hospitalization / Surgery History Type of Hospitalization/Surgery cardiac stent placement cholecystectomy prostate surgery enteroscopic polypectomy knee surgery cystoscopy TURBT Family and  Social History Cancer: Yes - Siblings; Diabetes: Yes - Mother,Siblings; Heart Disease: No; Hereditary Spherocytosis: No; Hypertension: No; Kidney Disease: No; Lung Disease: No; Seizures: No; Stroke: No; Thyroid Problems: No; Tuberculosis: No; Former smoker - quit 7 yr ago; Marital Status - Married; Alcohol Use: Never; Drug Use: No History; Caffeine Use: Daily - coffee; Financial Concerns: No; Food, Clothing or Shelter Needs: No; Support System Lacking: No; Transportation Concerns: No Electronic Signature(s) Signed: 11/07/2020 5:31:21 PM By: Baruch Gouty RN, BSN Signed: 11/10/2020 11:55:14 PM By: Robert Shan DO Entered By: Baruch Gouty on 11/07/2020 14:08:45 -------------------------------------------------------------------------------- [place] Details Patient Name: Date of Service: [name], [name] 11/07/2020 Medical Record Number: [id number] Patient Account Number: [id number] Date of Birth/Sex: Treating RN: 1933-11-27 (87 y.o. [name], [name] Primary Care Provider: Merri Acosta Other Clinician: Referring Provider: Treating Provider/Extender: [name] in Treatment: 0 Diagnosis Coding ICD-10 Codes Code Description N30.41 Irradiation cystitis with hematuria C61 Malignant neoplasm of prostate E11.9 Type 2 diabetes mellitus without  complications Facility Procedures CPT4 Code: [id number] Description: [phone] - WOUND CARE VISIT-LEV 2 EST PT Modifier: Quantity: 1 Physician Procedures : CPT4 Code Description Modifier [id number] - WC PHYS LEVEL 4 - NEW PT ICD-10 Diagnosis Description N30.41 Irradiation cystitis with hematuria C61 Malignant neoplasm of prostate E11.9 Type 2 diabetes mellitus without complications Quantity: 1 Electronic Signature(s) Signed: 11/10/2020 11:52:57 PM By: Robert Shan DO Previous Signature: 11/10/2020 11:49:57 PM Version By: Robert Shan DO Previous Signature: 11/10/2020 11:46:42 PM Version By: Robert Shan DO Entered By: [name] on 11/10/2020 23:52:57

## 2020-11-11 NOTE — Progress Notes (Signed)
Progress Note    DARIUS LUNDBERG   LKJ:[id number]  DOB: 1933/10/20  DOA: 11/08/2020     3  PCP: Robyne Peers, MD  CC: hematuria  Hospital Course: Mr. Talarico is an 87  yo male with PMH prostate cancer s/p radiation treatment (several years ago), DMII, HTN who presented to the hospital with difficulty urinating at home and found to have hematuria and weakness.  He has had ongoing hematuria overall worsening at home.  He was finally convinced to come to the ER for further work-up and evaluation.  Hemoglobin on work-up was 7.1 g/dL.  He was transfused 2 units PRBC on admission and urology was consulted.  He underwent Foley catheter placement and started on CBI. He then underwent cystoscopy with clot evacuation and TURP on 11/10/2020.  Urine cleared after this.  Interval History:  No events overnight.  Wife present bedside this morning and update given with questions answered.  Patient is feeling much better and urine in Foley bag has cleared. We discussed monitoring his hemoglobin further overnight and if stable, probable discharge home tomorrow pending the plan with urology for the Foley.  ROS: Constitutional: negative for chills and fevers, Respiratory: negative for cough and sputum, Cardiovascular: negative for chest pain, and Gastrointestinal: negative for abdominal pain  Assessment & Plan: * Cystitis, radiation - s/p prostate cancer treated with radiation therapy - recurrent issue; plan was to start HBO outpatient -Treated initially with CBI per urology during hospitalization - underwent cystoscopy with clot evacuation and TURP on 11/10/2020; this morning, 11/11/2020, his urine has turned to clear yellow and Foley draining well - Tentative plan is for patient to be monitored further overnight due to slight hemoglobin drop which is likely a lag and probable removal of Foley with urology tomorrow (per patient)  Hematuria - baseline Hgb 10-12 g/dL - Hgb 7.1 g/dL on admission; s/p 2  units PRBC on 8/3; repeat Hgb on 8/4 is 9.4 g/dL - Hgb improved but slight drop this am to 8.2 g/dL (probable lag as urine clear and no further bleeding - repeat H/H tomorrow; if stable, would be okay for discharge home as well pending plan with foley per urology  Acute renal failure superimposed on stage 3b chronic kidney disease ([place]) - patient has history of CKD3b. Baseline creat ~ 2, eGFR 33 - patient presents with increase in creat >0.3 mg/dL above baseline, creat increase >1.5x baseline presumed to have occurred within past 7 days PTA - creat 2.56 on admission; likely from BOO  - now s/p foley and CBI; trend renal response  -Underwent cystoscopy with clot evacuation in OR with urology on 11/10/2020  Dyslipidemia, goal LDL below 70 - Continue statin  Essential hypertension - Continue current regimen  Insulin dependent type 2 diabetes mellitus, controlled ([place]) - continue SSI and CBG monitoring    Old records reviewed in assessment of this patient  Antimicrobials:   DVT prophylaxis: SCDs Start: 11/08/20 1626   Code Status:   Code Status: Full Code Family Communication:   Disposition Plan: Status is: Inpatient  Remains inpatient appropriate because:IV treatments appropriate due to intensity of illness or inability to take PO and Inpatient level of care appropriate due to severity of illness  Dispo: The patient is from: Home              Anticipated d/c is to: Home              Patient currently is not medically stable to d/c.  Difficult to place patient No  Risk of unplanned readmission score: Unplanned Admission- [place] do not use: 21.81   Objective: Blood pressure 126/66, pulse 74, temperature 98.1 F (36.7 C), temperature source Oral, resp. rate 15, height 6' 0.01" (1.829 m), weight 107 kg, SpO2 95 %.  Examination: General appearance: alert, cooperative, and no distress Head: Normocephalic, without obvious abnormality, atraumatic Eyes:  EOMI Lungs: clear to  auscultation bilaterally Heart: regular rate and rhythm and S1, S2 normal Abdomen: normal findings: bowel sounds normal and soft, non-tender GU: Now clear yellow urine noted in Foley bag Extremities:  no edema Skin: mobility and turgor normal Neurologic: Grossly normal  Consultants:  Urology  Procedures:  Cystoscopy with clot evacuation and TURP, 11/10/2020  Data Reviewed: I have personally reviewed following labs and imaging studies Results for orders placed or performed during the hospital encounter of 11/08/20 (from the past 24 hour(s))  Glucose, capillary     Status: Abnormal   Collection Time: 11/10/20  4:09 PM  Result Value Ref Range   Glucose-Capillary 122 (H) 70 - 99 mg/dL  Glucose, capillary     Status: Abnormal   Collection Time: 11/10/20  5:50 PM  Result Value Ref Range   Glucose-Capillary 113 (H) 70 - 99 mg/dL  Glucose, capillary     Status: Abnormal   Collection Time: 11/10/20  6:57 PM  Result Value Ref Range   Glucose-Capillary 139 (H) 70 - 99 mg/dL   Comment 1 Notify RN    Comment 2 Document in Chart   Glucose, capillary     Status: Abnormal   Collection Time: 11/10/20  8:38 PM  Result Value Ref Range   Glucose-Capillary 205 (H) 70 - 99 mg/dL  Basic metabolic panel     Status: Abnormal   Collection Time: 11/11/20  7:03 AM  Result Value Ref Range   Sodium 136 135 - 145 mmol/L   Potassium 3.9 3.5 - 5.1 mmol/L   Chloride 107 98 - 111 mmol/L   CO2 22 22 - 32 mmol/L   Glucose, Bld 137 (H) 70 - 99 mg/dL   BUN 32 (H) 8 - 23 mg/dL   Creatinine, Ser 2.27 (H) 0.61 - 1.24 mg/dL   Calcium 8.2 (L) 8.9 - 10.3 mg/dL   GFR, Estimated 28 (L) >60 mL/min   Anion gap 7 5 - 15  CBC with Differential/Platelet     Status: Abnormal   Collection Time: 11/11/20  7:03 AM  Result Value Ref Range   WBC 11.9 (H) 4.0 - 10.5 K/uL   RBC 3.09 (L) 4.22 - 5.81 MIL/uL   Hemoglobin 8.8 (L) 13.0 - 17.0 g/dL   HCT 27.7 (L) 39.0 - 52.0 %   MCV 89.6 80.0 - 100.0 fL   MCH 28.5 26.0 - 34.0 pg    MCHC 31.8 30.0 - 36.0 g/dL   RDW 14.9 11.5 - 15.5 %   Platelets 175 150 - 400 K/uL   nRBC 0.0 0.0 - 0.2 %   Neutrophils Relative % 78 %   Neutro Abs 9.3 (H) 1.7 - 7.7 K/uL   Lymphocytes Relative 8 %   Lymphs Abs 1.0 0.7 - 4.0 K/uL   Monocytes Relative 11 %   Monocytes Absolute 1.3 (H) 0.1 - 1.0 K/uL   Eosinophils Relative 2 %   Eosinophils Absolute 0.2 0.0 - 0.5 K/uL   Basophils Relative 0 %   Basophils Absolute 0.0 0.0 - 0.1 K/uL   Immature Granulocytes 1 %   Abs Immature Granulocytes 0.10 (  H) 0.00 - 0.07 K/uL  Magnesium     Status: None   Collection Time: 11/11/20  7:03 AM  Result Value Ref Range   Magnesium 2.1 1.7 - 2.4 mg/dL  Glucose, capillary     Status: Abnormal   Collection Time: 11/11/20  7:36 AM  Result Value Ref Range   Glucose-Capillary 133 (H) 70 - 99 mg/dL  Glucose, capillary     Status: Abnormal   Collection Time: 11/11/20 11:28 AM  Result Value Ref Range   Glucose-Capillary 167 (H) 70 - 99 mg/dL    Recent Results (from the past 240 hour(s))  Urine Culture     Status: Abnormal   Collection Time: 11/08/20 11:36 AM   Specimen: Urine, Clean Catch  Result Value Ref Range Status   Specimen Description   Final    URINE, CLEAN CATCH Performed at [facility], [place] [address], [place], [place] [address]    Special Requests   Final    NONE Performed at [facility], [place] [address], [place], [place] [address]    Culture (A)  Final    <10,000 COLONIES/mL INSIGNIFICANT GROWTH Performed at [place] [address] [place], [place] [address]    Report Status 11/10/2020 FINAL  Final  Resp Panel by RT-PCR (Flu A&B, Covid) Urine, Clean Catch     Status: None   Collection Time: 11/08/20  3:03 PM   Specimen: Urine, Clean Catch; Nasopharyngeal(NP) swabs in vial transport medium  Result Value Ref Range Status   SARS Coronavirus 2 by RT PCR NEGATIVE NEGATIVE Final    Comment: (NOTE) SARS-CoV-2 target nucleic acids are  NOT DETECTED.  The SARS-CoV-2 RNA is generally detectable in upper respiratory specimens during the acute phase of infection. The lowest concentration of SARS-CoV-2 viral copies this assay can detect is 138 copies/mL. A negative result does not preclude SARS-Cov-2 infection and should not be used as the sole basis for treatment or other patient management decisions. A negative result may occur with  improper specimen collection/handling, submission of specimen other than nasopharyngeal swab, presence of viral mutation(s) within the areas targeted by this assay, and inadequate number of viral copies(<138 copies/mL). A negative result must be combined with clinical observations, patient history, and epidemiological information. The expected result is Negative.  Fact Sheet for Patients:  EntrepreneurPulse.com.au  Fact Sheet for Healthcare Providers:  IncredibleEmployment.be  This test is no t yet approved or cleared by the [place] FDA and  has been authorized for detection and/or diagnosis of SARS-CoV-2 by FDA under an Emergency Use Authorization (EUA). This EUA will remain  in effect (meaning this test can be used) for the duration of the COVID-19 declaration under Section 564(b)(1) of the Act, 21 U.S.C.section 360bbb-3(b)(1), unless the authorization is terminated  or revoked sooner.       Influenza A by PCR NEGATIVE NEGATIVE Final   Influenza B by PCR NEGATIVE NEGATIVE Final    Comment: (NOTE) The Xpert Xpress SARS-CoV-2/FLU/RSV plus assay is intended as an aid in the diagnosis of influenza from Nasopharyngeal swab specimens and should not be used as a sole basis for treatment. Nasal washings and aspirates are unacceptable for Xpert Xpress SARS-CoV-2/FLU/RSV testing.  Fact Sheet for Patients: EntrepreneurPulse.com.au  Fact Sheet for Healthcare Providers: IncredibleEmployment.be  This test is not  yet approved or cleared by the [place] FDA and has been authorized for detection and/or diagnosis of SARS-CoV-2 by FDA under an Emergency Use Authorization (EUA). This EUA  will remain in effect (meaning this test can be used) for the duration of the COVID-19 declaration under Section 564(b)(1) of the Act, 21 U.S.C. section 360bbb-3(b)(1), unless the authorization is terminated or revoked.  Performed at [facility], [place] [address], [place], [place] [address]      Radiology Studies: No results found. No orders to display    Scheduled Meds:  amLODipine  10 mg Oral Daily   atorvastatin  10 mg Oral Daily   carvedilol  25 mg Oral BID   Chlorhexidine Gluconate Cloth  6 each Topical Daily   furosemide  40 mg Oral BID   insulin aspart  0-15 Units Subcutaneous TID WC   insulin aspart  0-5 Units Subcutaneous QHS   melatonin  3 mg Oral QHS   PRN Meds: acetaminophen **OR** acetaminophen, morphine injection Continuous Infusions:  sodium chloride irrigation Stopped (11/09/20 1137)     LOS: 3 days  Time spent: Greater than 50% of the 35 minute visit was spent in counseling/coordination of care for the patient as laid out in the A&P.   Dwyane Dee, MD [facility] 11/11/2020, 2:35 PM

## 2020-11-11 NOTE — Anesthesia Postprocedure Evaluation (Signed)
Anesthesia Post Note  Patient: [name]  Procedure(s) Performed: CYSTOSCOPY, CLOT EVACTUATION,FULGURATION (Bladder)     Patient location during evaluation: PACU Anesthesia Type: General Level of consciousness: awake and alert Pain management: pain level controlled Vital Signs Assessment: post-procedure vital signs reviewed and stable Respiratory status: spontaneous breathing, nonlabored ventilation, respiratory function stable and patient connected to nasal cannula oxygen Cardiovascular status: blood pressure returned to baseline and stable Postop Assessment: no apparent nausea or vomiting Anesthetic complications: no   No notable events documented.  Last Vitals:  Vitals:   11/11/20 0532 11/11/20 1310  BP: (!) 165/67 126/66  Pulse: 84 74  Resp: 18 15  Temp: 37.2 C 36.7 C  SpO2: 93% 95%    Last Pain:  Vitals:   11/11/20 1310  TempSrc: Oral  PainSc:                  [name]

## 2020-11-12 ENCOUNTER — Encounter (HOSPITAL_COMMUNITY): Payer: Self-pay | Admitting: Urology

## 2020-11-12 DIAGNOSIS — N3040 Irradiation cystitis without hematuria: Secondary | ICD-10-CM | POA: Diagnosis not present

## 2020-11-12 LAB — CBC WITH DIFFERENTIAL/PLATELET
Abs Immature Granulocytes: 0.05 10*3/uL (ref 0.00–0.07)
Basophils Absolute: 0 10*3/uL (ref 0.0–0.1)
Basophils Relative: 0 %
Eosinophils Absolute: 0.2 10*3/uL (ref 0.0–0.5)
Eosinophils Relative: 2 %
HCT: 26 % — ABNORMAL LOW (ref 39.0–52.0)
Hemoglobin: 8.3 g/dL — ABNORMAL LOW (ref 13.0–17.0)
Immature Granulocytes: 1 %
Lymphocytes Relative: 11 %
Lymphs Abs: 1.2 10*3/uL (ref 0.7–4.0)
MCH: 28 pg (ref 26.0–34.0)
MCHC: 31.9 g/dL (ref 30.0–36.0)
MCV: 87.8 fL (ref 80.0–100.0)
Monocytes Absolute: 1.2 10*3/uL — ABNORMAL HIGH (ref 0.1–1.0)
Monocytes Relative: 11 %
Neutro Abs: 8.3 10*3/uL — ABNORMAL HIGH (ref 1.7–7.7)
Neutrophils Relative %: 75 %
Platelets: 171 10*3/uL (ref 150–400)
RBC: 2.96 MIL/uL — ABNORMAL LOW (ref 4.22–5.81)
RDW: 14.4 % (ref 11.5–15.5)
WBC: 11 10*3/uL — ABNORMAL HIGH (ref 4.0–10.5)
nRBC: 0 % (ref 0.0–0.2)

## 2020-11-12 LAB — BASIC METABOLIC PANEL
Anion gap: 8 (ref 5–15)
BUN: 40 mg/dL — ABNORMAL HIGH (ref 8–23)
CO2: 21 mmol/L — ABNORMAL LOW (ref 22–32)
Calcium: 8.1 mg/dL — ABNORMAL LOW (ref 8.9–10.3)
Chloride: 108 mmol/L (ref 98–111)
Creatinine, Ser: 2.55 mg/dL — ABNORMAL HIGH (ref 0.61–1.24)
GFR, Estimated: 24 mL/min — ABNORMAL LOW (ref >60)
Glucose, Bld: 155 mg/dL — ABNORMAL HIGH (ref 70–99)
Potassium: 4 mmol/L (ref 3.5–5.1)
Sodium: 137 mmol/L (ref 135–145)

## 2020-11-12 LAB — GLUCOSE, CAPILLARY
Glucose-Capillary: 168 mg/dL — ABNORMAL HIGH (ref 70–99)
Glucose-Capillary: 134 mg/dL — ABNORMAL HIGH (ref 70–99)

## 2020-11-12 LAB — MAGNESIUM: Magnesium: 2.1 mg/dL (ref 1.7–2.4)

## 2020-11-12 NOTE — Progress Notes (Signed)
2 Days Post-Op Subjective: Patient reports good pain control. Urine clear off CBI  Objective: Vital signs in last 24 hours: Temp:  [98.1 F (36.7 C)-99.1 F (37.3 C)] 99 F (37.2 C) (08/07 0545) Pulse Rate:  [74-87] 83 (08/07 0545) Resp:  [15-18] 18 (08/07 0545) BP: (126-147)/(59-72) 130/72 (08/07 0545) SpO2:  [95 %-96 %] 96 % (08/07 0545)  Intake/Output from previous day: 08/06 0701 - 08/07 0700 In: 720 [P.O.:720] Out: 3450 [Urine:3450] Intake/Output this shift: Total I/O In: 240 [P.O.:240] Out: 200 [Urine:200]  Physical Exam:  General:alert, cooperative, and appears stated age GI: soft, non tender, normal bowel sounds, no palpable masses, no organomegaly, no inguinal hernia Male genitalia: not done Extremities: extremities normal, atraumatic, no cyanosis or edema  Lab Results: Recent Labs    11/10/20 0523 11/11/20 0703 11/12/20 0550  HGB 10.3* 8.8* 8.3*  HCT 32.0* 27.7* 26.0*   BMET Recent Labs    11/11/20 0703 11/12/20 0550  NA 136 137  K 3.9 4.0  CL 107 108  CO2 22 21*  GLUCOSE 137* 155*  BUN 32* 40*  CREATININE 2.27* 2.55*  CALCIUM 8.2* 8.1*   No results for input(s): LABPT, INR in the last 72 hours. No results for input(s): LABURIN in the last 72 hours. Results for orders placed or performed during the hospital encounter of 11/08/20  Urine Culture     Status: Abnormal   Collection Time: 11/08/20 11:36 AM   Specimen: Urine, Clean Catch  Result Value Ref Range Status   Specimen Description   Final    URINE, CLEAN CATCH Performed at [facility], [place] [address], [place], [place] [address]    Special Requests   Final    NONE Performed at [facility], [place] [address] [place], [place] [address]    Culture (A)  Final    <10,000 COLONIES/mL INSIGNIFICANT GROWTH Performed at [facility] [address] [place], [place] [address]    Report Status 11/10/2020 FINAL  Final  Resp Panel by RT-PCR (Flu A&B,  Covid) Urine, Clean Catch     Status: None   Collection Time: 11/08/20  3:03 PM   Specimen: Urine, Clean Catch; Nasopharyngeal(NP) swabs in vial transport medium  Result Value Ref Range Status   SARS Coronavirus 2 by RT PCR NEGATIVE NEGATIVE Final    Comment: (NOTE) SARS-CoV-2 target nucleic acids are NOT DETECTED.  The SARS-CoV-2 RNA is generally detectable in upper respiratory specimens during the acute phase of infection. The lowest concentration of SARS-CoV-2 viral copies this assay can detect is 138 copies/mL. A negative result does not preclude SARS-Cov-2 infection and should not be used as the sole basis for treatment or other patient management decisions. A negative result may occur with  improper specimen collection/handling, submission of specimen other than nasopharyngeal swab, presence of viral mutation(s) within the areas targeted by this assay, and inadequate number of viral copies(<138 copies/mL). A negative result must be combined with clinical observations, patient history, and epidemiological information. The expected result is Negative.  Fact Sheet for Patients:  EntrepreneurPulse.com.au  Fact Sheet for Healthcare Providers:  IncredibleEmployment.be  This test is no t yet approved or cleared by the [place] FDA and  has been authorized for detection and/or diagnosis of SARS-CoV-2 by FDA under an Emergency Use Authorization (EUA). This EUA will remain  in effect (meaning this test can be used) for the duration of the COVID-19 declaration under Section 564(b)(1) of the Act, 21 U.S.C.section 360bbb-3(b)(1), unless  the authorization is terminated  or revoked sooner.       Influenza A by PCR NEGATIVE NEGATIVE Final   Influenza B by PCR NEGATIVE NEGATIVE Final    Comment: (NOTE) The Xpert Xpress SARS-CoV-2/FLU/RSV plus assay is intended as an aid in the diagnosis of influenza from Nasopharyngeal swab specimens and should  not be used as a sole basis for treatment. Nasal washings and aspirates are unacceptable for Xpert Xpress SARS-CoV-2/FLU/RSV testing.  Fact Sheet for Patients: EntrepreneurPulse.com.au  Fact Sheet for Healthcare Providers: IncredibleEmployment.be  This test is not yet approved or cleared by the [place] FDA and has been authorized for detection and/or diagnosis of SARS-CoV-2 by FDA under an Emergency Use Authorization (EUA). This EUA will remain in effect (meaning this test can be used) for the duration of the COVID-19 declaration under Section 564(b)(1) of the Act, 21 U.S.C. section 360bbb-3(b)(1), unless the authorization is terminated or revoked.  Performed at [facility], [place] [address] [place] [address] [place], [place] [address]     Studies/Results: No results found.  Assessment/Plan: POD#2 cystoscopy with clot evacuation and fulgeration Voiding trial today. Patient can be discharged home after he passes his voiding trial   LOS: 4 days   [name] 11/12/2020, 9:44 AM

## 2020-11-12 NOTE — Progress Notes (Signed)
Patient voided 44mL clear yellow urine to urinal. Immediately following, bladder scan complete. 222mL highest reading. MD aware, okay to d/c. Instructed patient to return to ER or call urology if he has difficulty urinating or notices any blood in urine. Patient verbalized understanding. All d/c instructions reviewed w/ patient. Verbalized understanding of follow up appt and labs. Awaiting ride home. Will cont to monitor.

## 2020-11-13 NOTE — Discharge Summary (Signed)
Physician Discharge Summary   [name] XFG:[id number] DOB: 1934/03/13 DOA: 11/08/2020  PCP: Robyne Peers, MD  Admit date: 11/08/2020 Discharge date: 11/12/2020   Admitted From: home Disposition:  home Discharging physician: Dwyane Dee, MD  Recommendations for Outpatient Follow-up:  Repeat CBC and BMP at follow up Follow up with urology   Home Health:  Equipment/Devices:   Patient discharged to home in Discharge Condition: stable Risk of unplanned readmission score: Unplanned Admission- Pilot do not use: 21.73  CODE STATUS: Full Diet recommendation:  Diet Orders (From admission, onward)     Start     Ordered   11/12/20 0000  Diet - low sodium heart healthy        11/12/20 1449   11/12/20 0000  Diet Carb Modified        11/12/20 [place] Hospital Course: [name] is an 87  yo male with PMH prostate cancer s/p radiation treatment (several years ago), DMII, HTN who presented to the hospital with difficulty urinating at home and found to have hematuria and weakness.  He has had ongoing hematuria overall worsening at home.  He was finally convinced to come to the ER for further work-up and evaluation.  Hemoglobin on work-up was 7.1 g/dL.  He was transfused 2 units PRBC on admission and urology was consulted.  He underwent Foley catheter placement and started on CBI. He then underwent cystoscopy with clot evacuation and TURP on 11/10/2020.  Urine cleared after this. His Foley was removed on 11/12/2020 and he underwent a voiding trial.  He was able to void well prior to discharge with ongoing clear yellow urine.  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  Voiding trial considered passed prior to discharge with clear yellow urine easily voided; he was instructed to call or return if he does have difficulty voiding again upon returning home  Hematuria - baseline Hgb 10-12 g/dL - Hgb 7.1 g/dL on admission; s/p 2 units PRBC on 8/3; repeat Hgb on 8/4 is 9.4 g/dL - Hgb improved but slight drop this am to 8.2 g/dL (probable lag as urine clear and no further bleeding - Hgb stable at discharge; does need repeat CBC at followup - urine remained clear yellow at time of discharge   Acute renal failure superimposed on stage 3b chronic kidney disease ([place]) - patient has history of CKD3b. Baseline creat ~ 2, eGFR 33 - patient presents with increase in creat >0.3 mg/dL above baseline, creat increase >1.5x baseline presumed to have occurred within past 7 days PTA - creat 2.56 on admission; likely from BOO  - now s/p foley and CBI -Underwent cystoscopy with clot evacuation in OR with urology on 11/10/2020 -Needs repeat BMP at follow-up  Dyslipidemia, goal LDL below 70 - Continue statin  Essential hypertension - Continue current regimen  Insulin dependent type 2 diabetes mellitus, controlled ([place]) - continue SSI and CBG monitoring     The patient's chronic medical conditions were treated accordingly per the patient's home medication regimen except as noted.  On day of discharge, patient was felt deemed stable for discharge. Patient/family member advised to call PCP or come back  to ER if needed.   Principal Diagnosis: Cystitis, radiation  Discharge Diagnoses: Active Hospital Problems   Diagnosis Date Noted   Cystitis, radiation 12/01/2013    Priority: High   Hematuria 12/01/2013    Priority: High   Acute renal failure superimposed on stage 3b chronic kidney disease ([place]) 09/07/2019    Priority: Medium   Essential hypertension    Insulin dependent type 2 diabetes mellitus, controlled ([facility])    Dyslipidemia, goal LDL below 70    CAD  S/P percutaneous coronary angioplasty 05/08/2008    Resolved Hospital Problems  No resolved problems to display.    Discharge Instructions     Diet - low sodium heart healthy   Complete by: As directed    Diet Carb Modified   Complete by: As directed    Increase activity slowly   Complete by: As directed       Allergies as of 11/12/2020   No Known Allergies      Medication List     TAKE these medications    Accu-Chek Aviva Plus w/Device Kit Test blood sugar 3 times daily. Dx code: E11.22   accu-chek multiclix lancets Test blood sugar 3 times daily. Dx code: E11.22   acetaminophen 500 MG tablet Commonly known as: TYLENOL Take 1,000 mg by mouth daily.   amLODipine 10 MG tablet Commonly known as: NORVASC TAKE 1 TABLET EVERY DAY   atorvastatin 10 MG tablet Commonly known as: LIPITOR TAKE 1 TABLET (10 MG TOTAL) BY MOUTH DAILY.   Blood Press Monitor/M-L Cuff Misc 1 application by Does not apply route daily.   carvedilol 25 MG tablet Commonly known as: COREG Take 1 tablet (25 mg total) by mouth 2 (two) times daily.   Continuous Glucose Monitor Sup Kit 1 kit by Does not apply route as directed.   Fish Oil 1000 MG Caps Take 1 capsule by mouth daily.   FreeStyle [employer] Misc 1 application by Does not apply route daily.   furosemide 40 MG tablet Commonly known as: LASIX Take 1 tablet (40 mg total) by mouth 2 (two) times daily. As needed for swelling What changed: additional instructions   glucose blood test strip Commonly known as: Accu-Chek Aviva Plus Test blood sugar 3 times daily. Dx code: E11.22   Insulin Syringe-Needle U-100 31G X 5/16" 0.3 ML Misc Commonly known as: B-D INS SYR HALF-UNIT .3CC/31G Use daily at bedtime to inject insulin. Dx code: 250.00   lisinopril 40 MG tablet Commonly known as: ZESTRIL Take 40 mg by mouth daily. What changed: Another medication with the same name was removed. Continue taking this medication, and  follow the directions you see here.   multivitamin tablet Take 1 tablet by mouth daily. COMPLETE SENIOR VITAMINS AND M   NovoLIN 70/30 (70-30) 100 UNIT/ML injection Generic drug: insulin NPH-regular Human Inject 11 Units into the skin 2 (two) times daily with a meal. What changed: how much to take   Vitamin D (Cholecalciferol) 25 MCG (1000 UT) Tabs Take 1 tablet by mouth daily.        Follow-up Information     Robyne Peers, MD. Schedule an appointment as soon as possible for a visit in 1 week(s).   Specialty: Family Medicine Why: Please ask for labs to check your kidney function and hemoglobin Contact information: [address] [place] [address] [phone]         Lorretta Harp, MD .   Specialties: Cardiology, Radiology Contact information:  [address] [place] [address] [phone]         Thompson Grayer, MD .   Specialty: Cardiology Contact information: [place] [address] [phone]                No Known Allergies  Consultations: Urology  Discharge Exam: BP 129/60 (BP Location: Right Arm)   Pulse 70   Temp (!) 97.5 F (36.4 C) (Oral)   Resp 20   Ht 6' 0.01" (1.829 m)   Wt 107 kg   SpO2 96%   BMI 31.99 kg/m  General appearance: alert, cooperative, and no distress Head: Normocephalic, without obvious abnormality, atraumatic Eyes:  EOMI Lungs: clear to auscultation bilaterally Heart: regular rate and rhythm and S1, S2 normal Abdomen: normal findings: bowel sounds normal and soft, non-tender Extremities:  no edema Skin: mobility and turgor normal Neurologic: Grossly normal  The results of significant diagnostics from this hospitalization (including imaging, microbiology, ancillary and laboratory) are listed below for reference.   Microbiology: Recent Results (from the past 240 hour(s))  Urine Culture     Status: Abnormal   Collection Time: 11/08/20 11:36  AM   Specimen: Urine, Clean Catch  Result Value Ref Range Status   Specimen Description   Final    URINE, CLEAN CATCH Performed at [facility], [place] [address], [place], [place] [address]    Special Requests   Final    NONE Performed at [facility], [place] [address] [place], [place] [address]    Culture (A)  Final    <10,000 COLONIES/mL INSIGNIFICANT GROWTH Performed at [place] [address], [place], [place] [address]    Report Status 11/10/2020 FINAL  Final  Resp Panel by RT-PCR (Flu A&B, Covid) Urine, Clean Catch     Status: None   Collection Time: 11/08/20  3:03 PM   Specimen: Urine, Clean Catch; Nasopharyngeal(NP) swabs in vial transport medium  Result Value Ref Range Status   SARS Coronavirus 2 by RT PCR NEGATIVE NEGATIVE Final    Comment: (NOTE) SARS-CoV-2 target nucleic acids are NOT DETECTED.  The SARS-CoV-2 RNA is generally detectable in upper respiratory specimens during the acute phase of infection. The lowest concentration of SARS-CoV-2 viral copies this assay can detect is 138 copies/mL. A negative result does not preclude SARS-Cov-2 infection and should not be used as the sole basis for treatment or other patient management decisions. A negative result may occur with  improper specimen collection/handling, submission of specimen other than nasopharyngeal swab, presence of viral mutation(s) within the areas targeted by this assay, and inadequate number of viral copies(<138 copies/mL). A negative result must be combined with clinical observations, patient history, and epidemiological information. The expected result is Negative.  Fact Sheet for Patients:  EntrepreneurPulse.com.au  Fact Sheet for Healthcare Providers:  IncredibleEmployment.be  This test is no t yet approved or cleared by the [place] FDA and  has been authorized for detection and/or diagnosis of  SARS-CoV-2 by FDA under an Emergency Use Authorization (EUA). This EUA will remain  in effect (meaning this test can be used) for the duration of the COVID-19 declaration under Section 564(b)(1) of the Act, 21 U.S.C.section 360bbb-3(b)(1), unless the authorization is terminated  or revoked sooner.       Influenza A by PCR NEGATIVE NEGATIVE Final   Influenza B by PCR NEGATIVE NEGATIVE Final    Comment: (NOTE) The Xpert Xpress SARS-CoV-2/FLU/RSV plus assay is  intended as an aid in the diagnosis of influenza from Nasopharyngeal swab specimens and should not be used as a sole basis for treatment. Nasal washings and aspirates are unacceptable for Xpert Xpress SARS-CoV-2/FLU/RSV testing.  Fact Sheet for Patients: EntrepreneurPulse.com.au  Fact Sheet for Healthcare Providers: IncredibleEmployment.be  This test is not yet approved or cleared by the [place] FDA and has been authorized for detection and/or diagnosis of SARS-CoV-2 by FDA under an Emergency Use Authorization (EUA). This EUA will remain in effect (meaning this test can be used) for the duration of the COVID-19 declaration under Section 564(b)(1) of the Act, 21 U.S.C. section 360bbb-3(b)(1), unless the authorization is terminated or revoked.  Performed at [facility], [place] [address], [place], [place] [address]      Labs: BNP (last 3 results) No results for input(s): BNP in the last 8760 hours. Basic Metabolic Panel: Recent Labs  Lab 11/08/20 1204 11/09/20 0429 11/10/20 0523 11/11/20 0703 11/12/20 0550  NA 138 138 139 136 137  K 4.1 4.0 4.3 3.9 4.0  CL 106 110 109 107 108  CO2 _0 21*  GLUCOSE 141* 123* 181* 137* 155*  BUN 32* 28* 30* 32* 40*  CREATININE 2.56* 2.10* 2.23* 2.27* 2.55*  CALCIUM 8.4* 8.5* 8.9 8.2* 8.1*  MG  --   --  2.0 2.1 2.1   Liver Function Tests: Recent Labs  Lab 11/09/20 0429  AST 13*  ALT 14  ALKPHOS 34*   BILITOT 1.0  PROT 5.8*  ALBUMIN 3.0*   No results for input(s): LIPASE, AMYLASE in the last 168 hours. No results for input(s): AMMONIA in the last 168 hours. CBC: Recent Labs  Lab 11/08/20 1204 11/08/20 2103 11/09/20 0130 11/09/20 0429 11/10/20 0523 11/11/20 0703 11/12/20 0550  WBC 7.4  --   --  9.5 15.6* 11.9* 11.0*  NEUTROABS  --   --   --   --  11.8* 9.3* 8.3*  HGB 7.1*   < > 9.4* 9.5* 10.3* 8.8* 8.3*  HCT 23.2*   < > 28.4* 29.4* 32.0* 27.7* 26.0*  MCV 89.2  --   --  87.8 88.2 89.6 87.8  PLT 233  --   --  203 225 175 171   < > = values in this interval not displayed.   Cardiac Enzymes: No results for input(s): CKTOTAL, CKMB, CKMBINDEX, TROPONINI in the last 168 hours. BNP: Invalid input(s): POCBNP CBG: Recent Labs  Lab 11/11/20 1128 11/11/20 1638 11/11/20 2136 11/12/20 0748 11/12/20 1137  GLUCAP 167* 185* 161* 168* 134*   D-Dimer No results for input(s): DDIMER in the last 72 hours. Hgb A1c No results for input(s): HGBA1C in the last 72 hours. Lipid Profile No results for input(s): CHOL, HDL, LDLCALC, TRIG, CHOLHDL, LDLDIRECT in the last 72 hours. Thyroid function studies No results for input(s): TSH, T4TOTAL, T3FREE, THYROIDAB in the last 72 hours.  Invalid input(s): FREET3 Anemia work up No results for input(s): VITAMINB12, FOLATE, FERRITIN, TIBC, IRON, RETICCTPCT in the last 72 hours. Urinalysis    Component Value Date/Time   COLORURINE RED (A) 11/08/2020 1136   APPEARANCEUR CLOUDY (A) 11/08/2020 1136   LABSPEC 1.010 11/08/2020 1136   PHURINE 6.0 11/08/2020 1136   GLUCOSEU 50 (A) 11/08/2020 1136   HGBUR MODERATE (A) 11/08/2020 1136   BILIRUBINUR NEGATIVE 11/08/2020 1136   BILIRUBINUR negative 06/11/2018 1511   BILIRUBINUR neg 06/27/2014 1520   KETONESUR NEGATIVE 11/08/2020 1136   PROTEINUR 100 (A) 11/08/2020 1136  UROBILINOGEN 0.2 06/11/2018 1511   NITRITE NEGATIVE 11/08/2020 1136   LEUKOCYTESUR NEGATIVE 11/08/2020 1136   Sepsis  Labs Invalid input(s): PROCALCITONIN,  WBC,  LACTICIDVEN Microbiology Recent Results (from the past 240 hour(s))  Urine Culture     Status: Abnormal   Collection Time: 11/08/20 11:36 AM   Specimen: Urine, Clean Catch  Result Value Ref Range Status   Specimen Description   Final    URINE, CLEAN CATCH Performed at [facility], [place] [address], [place], [place] [address]    Special Requests   Final    NONE Performed at [facility], [place] [address], [place], [place] [address]    Culture (A)  Final    <10,000 COLONIES/mL INSIGNIFICANT GROWTH Performed at [place] [address], [place], [place] [address]    Report Status 11/10/2020 FINAL  Final  Resp Panel by RT-PCR (Flu A&B, Covid) Urine, Clean Catch     Status: None   Collection Time: 11/08/20  3:03 PM   Specimen: Urine, Clean Catch; Nasopharyngeal(NP) swabs in vial transport medium  Result Value Ref Range Status   SARS Coronavirus 2 by RT PCR NEGATIVE NEGATIVE Final    Comment: (NOTE) SARS-CoV-2 target nucleic acids are NOT DETECTED.  The SARS-CoV-2 RNA is generally detectable in upper respiratory specimens during the acute phase of infection. The lowest concentration of SARS-CoV-2 viral copies this assay can detect is 138 copies/mL. A negative result does not preclude SARS-Cov-2 infection and should not be used as the sole basis for treatment or other patient management decisions. A negative result may occur with  improper specimen collection/handling, submission of specimen other than nasopharyngeal swab, presence of viral mutation(s) within the areas targeted by this assay, and inadequate number of viral copies(<138 copies/mL). A negative result must be combined with clinical observations, patient history, and epidemiological information. The expected result is Negative.  Fact Sheet for Patients:  EntrepreneurPulse.com.au  Fact Sheet for Healthcare  Providers:  IncredibleEmployment.be  This test is no t yet approved or cleared by the [place] FDA and  has been authorized for detection and/or diagnosis of SARS-CoV-2 by FDA under an Emergency Use Authorization (EUA). This EUA will remain  in effect (meaning this test can be used) for the duration of the COVID-19 declaration under Section 564(b)(1) of the Act, 21 U.S.C.section 360bbb-3(b)(1), unless the authorization is terminated  or revoked sooner.       Influenza A by PCR NEGATIVE NEGATIVE Final   Influenza B by PCR NEGATIVE NEGATIVE Final    Comment: (NOTE) The Xpert Xpress SARS-CoV-2/FLU/RSV plus assay is intended as an aid in the diagnosis of influenza from Nasopharyngeal swab specimens and should not be used as a sole basis for treatment. Nasal washings and aspirates are unacceptable for Xpert Xpress SARS-CoV-2/FLU/RSV testing.  Fact Sheet for Patients: EntrepreneurPulse.com.au  Fact Sheet for Healthcare Providers: IncredibleEmployment.be  This test is not yet approved or cleared by the [place] FDA and has been authorized for detection and/or diagnosis of SARS-CoV-2 by FDA under an Emergency Use Authorization (EUA). This EUA will remain in effect (meaning this test can be used) for the duration of the COVID-19 declaration under Section 564(b)(1) of the Act, 21 U.S.C. section 360bbb-3(b)(1), unless the authorization is terminated or revoked.  Performed at [facility], [place] [address], [place], [place] [address]     Procedures/Studies: DG Chest 2 View  Result Date: 11/08/2020 CLINICAL DATA:  Irradiation cystitis. Hematuria. History of prostate  cancer. History of smoking. History of CHF. History of diabetes. EXAM: CHEST - 2 VIEW COMPARISON:  Chest x-ray 08/06/2019, 10/19/2018. FINDINGS: Mediastinum and hilar structures normal. Stable cardiomegaly. No pulmonary venous congestion. Chronic  interstitial changes again noted. Stable left base pleuroparenchymal thickening consistent with scarring. Degenerative change thoracic spine. Surgical clips upper abdomen. IMPRESSION: 1.  Stable cardiomegaly. 2. Stable chronic interstitial changes. Left base pleural-parenchymal thickening consistent with scarring again noted. No acute abnormality identified. Electronically Signed   By: Marcello Moores  Register   On: 11/08/2020 06:52     Time coordinating discharge: Over 62 minutes    Dwyane Dee, MD  [facility] 11/13/2020, 3:09 PM

## 2020-11-14 ENCOUNTER — Other Ambulatory Visit: Payer: Self-pay

## 2020-11-14 ENCOUNTER — Encounter (HOSPITAL_COMMUNITY): Payer: Self-pay | Admitting: Emergency Medicine

## 2020-11-14 ENCOUNTER — Emergency Department (HOSPITAL_COMMUNITY)
Admission: EM | Admit: 2020-11-14 | Discharge: 2020-11-15 | Disposition: A | Discharge status: Home / Self Care | Payer: Medicare HMO | Attending: Emergency Medicine | Admitting: Emergency Medicine

## 2020-11-14 DIAGNOSIS — D649 Anemia, unspecified: Secondary | ICD-10-CM | POA: Insufficient documentation

## 2020-11-14 DIAGNOSIS — E1122 Type 2 diabetes mellitus with diabetic chronic kidney disease: Secondary | ICD-10-CM | POA: Insufficient documentation

## 2020-11-14 DIAGNOSIS — I130 Hypertensive heart and chronic kidney disease with heart failure and stage 1 through stage 4 chronic kidney disease, or unspecified chronic kidney disease: Secondary | ICD-10-CM | POA: Insufficient documentation

## 2020-11-14 DIAGNOSIS — D631 Anemia in chronic kidney disease: Secondary | ICD-10-CM | POA: Diagnosis not present

## 2020-11-14 DIAGNOSIS — Z87891 Personal history of nicotine dependence: Secondary | ICD-10-CM | POA: Insufficient documentation

## 2020-11-14 DIAGNOSIS — I2510 Atherosclerotic heart disease of native coronary artery without angina pectoris: Secondary | ICD-10-CM | POA: Diagnosis not present

## 2020-11-14 DIAGNOSIS — Z79899 Other long term (current) drug therapy: Secondary | ICD-10-CM | POA: Insufficient documentation

## 2020-11-14 DIAGNOSIS — Z794 Long term (current) use of insulin: Secondary | ICD-10-CM | POA: Diagnosis not present

## 2020-11-14 DIAGNOSIS — I509 Heart failure, unspecified: Secondary | ICD-10-CM | POA: Insufficient documentation

## 2020-11-14 DIAGNOSIS — Z8546 Personal history of malignant neoplasm of prostate: Secondary | ICD-10-CM | POA: Diagnosis not present

## 2020-11-14 DIAGNOSIS — Z96651 Presence of right artificial knee joint: Secondary | ICD-10-CM | POA: Insufficient documentation

## 2020-11-14 DIAGNOSIS — R0602 Shortness of breath: Secondary | ICD-10-CM | POA: Diagnosis present

## 2020-11-14 DIAGNOSIS — N1832 Chronic kidney disease, stage 3b: Secondary | ICD-10-CM | POA: Diagnosis not present

## 2020-11-14 LAB — CBC WITH DIFFERENTIAL/PLATELET
Abs Immature Granulocytes: 0.03 10*3/uL (ref 0.00–0.07)
Basophils Absolute: 0.1 10*3/uL (ref 0.0–0.1)
Basophils Relative: 1 %
Eosinophils Absolute: 0.4 10*3/uL (ref 0.0–0.5)
Eosinophils Relative: 5 %
HCT: 26.4 % — ABNORMAL LOW (ref 39.0–52.0)
Hemoglobin: 8.3 g/dL — ABNORMAL LOW (ref 13.0–17.0)
Immature Granulocytes: 0 %
Lymphocytes Relative: 15 %
Lymphs Abs: 1.3 10*3/uL (ref 0.7–4.0)
MCH: 27.5 pg (ref 26.0–34.0)
MCHC: 31.4 g/dL (ref 30.0–36.0)
MCV: 87.4 fL (ref 80.0–100.0)
Monocytes Absolute: 1 10*3/uL (ref 0.1–1.0)
Monocytes Relative: 11 %
Neutro Abs: 5.9 10*3/uL (ref 1.7–7.7)
Neutrophils Relative %: 68 %
Platelets: 219 10*3/uL (ref 150–400)
RBC: 3.02 MIL/uL — ABNORMAL LOW (ref 4.22–5.81)
RDW: 14.2 % (ref 11.5–15.5)
WBC: 8.7 10*3/uL (ref 4.0–10.5)
nRBC: 0 % (ref 0.0–0.2)

## 2020-11-14 LAB — COMPREHENSIVE METABOLIC PANEL
ALT: 18 U/L (ref 0–44)
AST: 18 U/L (ref 15–41)
Albumin: 2.9 g/dL — ABNORMAL LOW (ref 3.5–5.0)
Alkaline Phosphatase: 45 U/L (ref 38–126)
Anion gap: 7 (ref 5–15)
BUN: 38 mg/dL — ABNORMAL HIGH (ref 8–23)
CO2: 21 mmol/L — ABNORMAL LOW (ref 22–32)
Calcium: 8.5 mg/dL — ABNORMAL LOW (ref 8.9–10.3)
Chloride: 112 mmol/L — ABNORMAL HIGH (ref 98–111)
Creatinine, Ser: 2.2 mg/dL — ABNORMAL HIGH (ref 0.61–1.24)
GFR, Estimated: 29 mL/min — ABNORMAL LOW (ref >60)
Glucose, Bld: 111 mg/dL — ABNORMAL HIGH (ref 70–99)
Potassium: 3.8 mmol/L (ref 3.5–5.1)
Sodium: 140 mmol/L (ref 135–145)
Total Bilirubin: 0.3 mg/dL (ref 0.3–1.2)
Total Protein: 6.2 g/dL — ABNORMAL LOW (ref 6.5–8.1)

## 2020-11-14 LAB — PREPARE RBC (CROSSMATCH)

## 2020-11-14 LAB — SURGICAL PATHOLOGY

## 2020-11-14 MED ORDER — SODIUM CHLORIDE 0.9 % IV SOLN
10.0000 mL/h | Freq: Once | INTRAVENOUS | Status: AC
  Administered 2020-11-15: 10 mL/h via INTRAVENOUS

## 2020-11-14 NOTE — ED Provider Notes (Signed)
[facility] DEPT Provider Note   CSN: [id number] Arrival date & time: 11/14/20  1550     History Chief Complaint  Patient presents with   Abnormal Lab    [name] is a 87 y.o. male.  He was sent in by his primary care doctor for evaluation of anemia.  He was recently in the hospital for gross hematuria, had an OR procedure which seems to have stopped the bleeding.  He said he has had no more hematuria and was discharged 2 days ago.  He has noticed some shortness of breath with exertion.  Denies any melena.  Went to his PCP who drew his labs and had a hemoglobin of 8.7.  Urology recommended that he come to the emergency department to get transfused.  The history is provided by the patient.  Abnormal Lab Patient referred by:  PCP Resulting agency:  Internal Result type: hematology   Hematology:    Hemoglobin:  Low     Past Medical History:  Diagnosis Date   Allergy    Anemia    CAD (coronary artery disease)    pci to LAD 10/09; stable CAD by cath 05/31/08; myoview 04/11/11- no ishcemia, EF 67%; echo 05/15/09- EF>55%, mod calcification of the aortic valve leaflets   CAD S/P percutaneous coronary angioplasty 05/08/2008   LAD PCI with DES 2009- Myoview low risk 2013   CHF (congestive heart failure) ([place]) 09/07/2019   Chronic back pain    Cystitis, radiation 12/01/2013   ED (erectile dysfunction)    Elevated serum creatinine 12/14/2015   Essential hypertension, benign    Hematuria 12/01/2013   Hyperlipidemia    Ileus ([place]) 12/14/2015   Insulin dependent type 2 diabetes mellitus, controlled ([place])    Malignant neoplasm of prostate ([place]) 11/26/2013   Multiple rib fractures    left 9th, 10th, and 11th posterior rib fractures S/P fall 12/09/2015/notes 12/12/2015   Osteoarthritis of right knee 03/04/2018   PREMATURE VENTRICULAR CONTRACTIONS 05/08/2008   Qualifier: Diagnosis of  By: Rayann Heman, MD, James     Prostate cancer [facility])    s/p   Rectal bleeding 12/23/2012    Evaluated by Dr Benson [facility] GI patient with history of radiation proctitis. Patient due for colonoscopy on 04/2014    Type II or unspecified type diabetes mellitus without mention of complication, not stated as uncontrolled    Ventricular tachycardia ([place])    resuscitated; monitor 05/2008; attempted T ablation 2/10- aborted due to inappropriate substrate    Patient Active Problem List   Diagnosis Date Noted   CHF (congestive heart failure) ([place]) 09/07/2019   Acute renal failure superimposed on stage 3b chronic kidney disease ([place]) 09/07/2019   Lower extremity edema 08/18/2019   Osteoarthritis of right knee 03/04/2018   Rib pain on left side 07/01/2016   Fall 12/14/2015   Elevated serum creatinine 12/14/2015   Ileus ([place]) 12/14/2015   Multiple fractures of ribs of left side 12/12/2015   Prostate cancer ([place]) 05/26/2014   Blood in the urine 12/01/2013   Cystitis, radiation 12/01/2013   Hematuria 12/01/2013   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   Male erectile dysfunction 11/26/2013   Pulsatile tinnitus 11/19/2013   Rectal bleeding 12/23/2012   Insulin dependent type 2 diabetes mellitus, controlled ([place])    Essential hypertension    Chronic back pain    ED (erectile dysfunction)    Anemia    Dyslipidemia, goal LDL below 70    CAD S/P percutaneous coronary angioplasty 05/08/2008   History  of ventricular tachycardia 05/08/2008   PREMATURE VENTRICULAR CONTRACTIONS 05/08/2008    Past Surgical History:  Procedure Laterality Date   CARDIAC CATHETERIZATION  05/31/08   EF 55-60%, stable lesions, medical therapy   COLECTOMY  '80's   polyps   CORONARY ANGIOPLASTY WITH STENT PLACEMENT  01/19/08   PCI stent to mid LAD with Promus DES 27.5x28   IR GENERIC HISTORICAL  01/03/2016   IR THORACENTESIS ASP PLEURAL SPACE W/IMG GUIDE 01/03/2016 [facility]INTERV RAD   KNEE ARTHROPLASTY Right 03/04/2018   Procedure: COMPUTER ASSISTED TOTAL KNEE ARTHROPLASTY;  Surgeon: Rod Can, MD;  Location: [facility] ORS;   Service: Orthopedics;  Laterality: Right;   PTCA  01/2008   TRANSURETHRAL RESECTION OF BLADDER TUMOR [name] 11/10/2020   Procedure: Silvestre Moment;  Surgeon: Cleon Gustin, MD;  Location: [facility] ORS;  Service: Urology;  Laterality: [name];       Family History  Problem Relation Age of Onset   Diabetes Mother    Heart attack Mother    Leukemia Father    Heart disease Sister    Heart disease Brother    Diabetes Brother        pancreatic cancer    Social History   Tobacco Use   Smoking status: Former    Packs/day: 0.00    Years: 15.00    Pack years: 0.00    Types: Cigarettes    Quit date: 05/10/2017    Years since quitting: 3.5   Smokeless tobacco: Never   Tobacco comments:    Patient smokes occasionally. Not everyday.  Vaping Use   Vaping Use: Never used  Substance Use Topics   Alcohol use: No   Drug use: No    Home Medications Prior to Admission medications   Medication Sig Start Date End Date Taking? Authorizing Provider  acetaminophen (TYLENOL) 500 MG tablet Take 1,000 mg by mouth daily.    [provider]  amLODipine (NORVASC) 10 MG tablet TAKE 1 TABLET EVERY DAY 10/09/20   Lorretta Harp, MD  atorvastatin (LIPITOR) 10 MG tablet TAKE 1 TABLET (10 MG TOTAL) BY MOUTH DAILY. 05/15/20   Lorretta Harp, MD  Blood Glucose Monitoring Suppl (ACCU-CHEK AVIVA PLUS) w/Device KIT Test blood sugar 3 times daily. Dx code: E11.22 04/03/16   [name], MD  Blood Pressure Monitoring (BLOOD PRESS MONITOR/M-L CUFF) MISC 1 application by Does not apply route daily. 04/26/20   [name], MD  carvedilol (COREG) 25 MG tablet Take 1 tablet (25 mg total) by mouth 2 (two) times daily. 11/23/19 11/08/20  Lorretta Harp, MD  Continuous Blood Gluc Sensor ([place]) MISC 1 application by Does not apply route daily. 06/30/20   [name], MD  Continuous Glucose Monitor Sup KIT 1 kit by Does not apply route as directed. 04/26/20    [name], MD  furosemide (LASIX) 40 MG tablet Take 1 tablet (40 mg total) by mouth 2 (two) times daily. As needed for swelling 11/23/19   Lorretta Harp, MD  glucose blood (ACCU-CHEK AVIVA PLUS) test strip Test blood sugar 3 times daily. Dx code: E11.22 04/03/16   [name], MD  insulin NPH-regular Human (NOVOLIN 70/30) (70-30) 100 UNIT/ML injection Inject 11 Units into the skin 2 (two) times daily with a meal. 06/30/20   [name], MD  Insulin Syringe-Needle U-100 (B-D INS SYR HALF-UNIT .3CC/31G) 31G X 5/16" 0.3 ML MISC Use daily at bedtime to inject insulin. Dx code: 250.00 06/21/13  [name], MD  Lancets (ACCU-CHEK MULTICLIX) lancets Test blood sugar 3 times daily. Dx code: E11.22 04/03/16   [name], MD  lisinopril (ZESTRIL) 40 MG tablet Take 40 mg by mouth daily.    [provider]  Multiple Vitamin (MULTIVITAMIN) tablet Take 1 tablet by mouth daily. COMPLETE SENIOR VITAMINS AND M    [provider]  Omega-3 Fatty Acids (FISH OIL) 1000 MG CAPS Take 1 capsule by mouth daily.    [provider]  Vitamin D, Cholecalciferol, 1000 UNITS TABS Take 1 tablet by mouth daily.     [provider]    Allergies    Patient has no known allergies.  Review of Systems   Review of Systems  Constitutional:  Negative for fever.  HENT:  Negative for sore throat.   Eyes:  Negative for visual disturbance.  Respiratory:  Positive for shortness of breath.   Cardiovascular:  Negative for chest pain.  Gastrointestinal:  Negative for abdominal pain.  Genitourinary:  Negative for dysuria and hematuria.  Musculoskeletal:  Negative for neck pain.  Skin:  Negative for rash.  Neurological:  Negative for headaches.   Physical Exam Updated Vital Signs BP 125/60 (BP Location: Left Arm)   Pulse 71   Temp 98.7 F (37.1 C) (Oral)   Resp 16   SpO2 97%   Physical Exam Vitals and nursing note reviewed.  Constitutional:       Appearance: Normal appearance. He is well-developed.  HENT:     Head: Normocephalic and atraumatic.  Eyes:     Conjunctiva/sclera: Conjunctivae normal.  Cardiovascular:     Rate and Rhythm: Normal rate and regular rhythm.     Heart sounds: No murmur heard. Pulmonary:     Effort: Pulmonary effort is normal. No respiratory distress.     Breath sounds: Normal breath sounds.  Abdominal:     Palpations: Abdomen is soft.     Tenderness: There is no abdominal tenderness. There is no guarding or rebound.  Musculoskeletal:        General: No deformity. Normal range of motion.     Cervical back: Neck supple.  Skin:    General: Skin is warm and dry.  Neurological:     General: No focal deficit present.     Mental Status: He is alert.    ED Results / Procedures / Treatments   Labs (all labs ordered are listed, but only abnormal results are displayed) Labs Reviewed  CBC WITH DIFFERENTIAL/PLATELET - Abnormal; Notable for the following components:      Result Value   RBC 3.02 (*)    Hemoglobin 8.3 (*)    HCT 26.4 (*)    All other components within normal limits  COMPREHENSIVE METABOLIC PANEL - Abnormal; Notable for the following components:   Chloride 112 (*)    CO2 21 (*)    Glucose, Bld 111 (*)    BUN 38 (*)    Creatinine, Ser 2.20 (*)    Calcium 8.5 (*)    Total Protein 6.2 (*)    Albumin 2.9 (*)    GFR, Estimated 29 (*)    All other components within normal limits  TYPE AND SCREEN  PREPARE RBC (CROSSMATCH)    EKG EKG Interpretation  Date/Time:  Tuesday November 14 2020 16:14:59 EDT Ventricular Rate:  70 PR Interval:  144 QRS Duration: 84 QT Interval:  378 QTC Calculation: 408 R Axis:   44 Text Interpretation: Normal sinus rhythm Normal ECG No significant  change since prior 8/22 Confirmed by Aletta Edouard [phone]) on 11/14/2020 4:41:01 PM  Radiology No results found.  Procedures Procedures   Medications Ordered in ED Medications  0.9 %  sodium chloride infusion  (0 mL/hr Intravenous Stopped 11/15/20 0010)    ED Course  I have reviewed the triage vital signs and the nursing notes.  Pertinent labs & imaging results that were available during my care of the patient were reviewed by me and considered in my medical decision making (see chart for details).  Clinical Course as of 11/15/20 1122  Tue Nov 14, 2020  2245 Reassessed patient.  He has had about half a unit of blood transfused.  He denies any complaints has no shortness of breath.  He is comfortable plan for discharge after completion of transfusion. [MB]    Clinical Course User Index [MB] Hayden Rasmussen, MD   MDM Rules/Calculators/A&P                           This patient complains of shortness of breath in the setting of recent hematuria; this involves an extensive number of treatment Options and is a complaint that carries with it a high risk of complications and Morbidity. The differential includes symptomatic anemia, fluid overload, metabolic derangement  I ordered, reviewed and interpreted labs, which included CBC with normal white count, hemoglobin low at 8.3, chemistries with findings consistent with a CKD fairly stable from priors I ordered medication transfusion of 1 unit packed red blood cells Additional history obtained from patient's wife Previous records obtained and reviewed in epic including recent admission for gross hematuria and operative intervention  After the interventions stated above, I reevaluated the patient and found patient to be fairly asymptomatic here.  His 1 unit of packed red blood cells is transfusing.  His care is signed out to Dr. Roxanne Mins to discharge the patient once his unit of blood is transfused.  Anticipate can be discharged if otherwise symptoms improved.  Final Clinical Impression(s) / ED Diagnoses Final diagnoses:  Symptomatic anemia    Rx / DC Orders ED Discharge Orders     None        Hayden Rasmussen, MD 11/15/20 1125

## 2020-11-14 NOTE — Discharge Instructions (Signed)
You are seen in the emergency department for evaluation of low blood count and shortness of breath.  You were transfused with a unit of blood with improvement in your symptoms.  Please follow-up with your primary care doctor and urologist.  Return to the emergency department if any worsening or concerning symptoms

## 2020-11-14 NOTE — ED Notes (Signed)
Blood consent signed

## 2020-11-14 NOTE — ED Triage Notes (Signed)
Patient here from home reporting that he was called today about a abnormal lab - hemoglobin. States that he recently had bladder surgery due to ongoing bleeding. Kidney patient.

## 2020-11-14 NOTE — ED Notes (Signed)
Pt resting in bed quietly at present. Pt denies needs. Male visitor attentive at bedside.

## 2020-11-15 LAB — BPAM RBC
Blood Product Expiration Date: 202209052359
ISSUE DATE / TIME: 202208092105
Unit Type and Rh: 6200

## 2020-11-15 LAB — TYPE AND SCREEN
ABO/RH(D): A POS
Antibody Screen: NEGATIVE
Unit division: 0

## 2020-12-13 ENCOUNTER — Other Ambulatory Visit: Payer: Self-pay | Admitting: Cardiovascular Disease

## 2020-12-19 NOTE — Progress Notes (Addendum)
[place], Padraic L. ([id number]) Visit Report for 11/07/2020 Allergy List Details Patient Name: Date of Service: [name], [name] 11/07/2020 1:15 PM Medical Record Number: [id number] Patient Account Number: [id number] Date of Birth/Sex: Treating RN: [birth year] (87 y.o. [name]: [name] Other Clinician: Referring [name]: Treating [name]: [name]: 0 Allergies Active Allergies No Known Allergies Allergy Notes Electronic Signature(s) Signed: 11/07/2020 5:31:21 PM By: Baruch Gouty RN, BSN Entered By: Baruch Gouty on 11/07/2020 13:19:53 -------------------------------------------------------------------------------- Arrival Information Details Patient Name: Date of Service: [name], [name] 11/07/2020 1:15 PM Medical Record Number: [id number] Patient Account Number: [id number] Date of Birth/Sex: Treating RN: [birth year] (87 y.o. [name] Primary Care [name]: [name] Other Clinician: Referring [name] in Acosta: 0 Visit Information Patient Arrived: Ambulatory Arrival Time: 13:19 Accompanied By: self Transfer Assistance: None Patient Identification Verified: Yes Secondary Verification Process Completed: Yes Patient Requires Transmission-Based Precautions: No Patient Has Alerts: No Electronic Signature(s) Signed: 11/07/2020 5:31:21 PM By: Baruch Gouty RN, BSN Entered By: Baruch Gouty on 11/07/2020 13:19:28 -------------------------------------------------------------------------------- Clinic Level of Care Assessment Details Patient Name: Date of Service: [name], [name] 11/07/2020 1:15 PM Medical Record Number: [id number] Patient Account Number: [id number] Date of Birth/Sex: Treating RN: 05/17/1933 (87 y.o. [name] Primary Care [name]: [name] Other  Clinician: Referring [name]: Treating [name]: [name]: 0 Clinic Level of Care Assessment Items TOOL 4 Quantity Score X- 1 0 Use when only an EandM is performed on FOLLOW-UP visit ASSESSMENTS - Nursing Assessment / Reassessment X- 1 10 Reassessment of Co-morbidities (includes updates in patient status) X- 1 5 Reassessment of Adherence to Acosta Plan ASSESSMENTS - Wound and Skin A ssessment / Reassessment []  - 0 Simple Wound Assessment / Reassessment - one wound []  - 0 Complex Wound Assessment / Reassessment - multiple wounds []  - 0 Dermatologic / Skin Assessment (not related to wound area) ASSESSMENTS - Focused Assessment []  - 0 Circumferential Edema Measurements - multi extremities []  - 0 Nutritional Assessment / Counseling / Intervention []  - 0 Lower Extremity Assessment (monofilament, tuning fork, pulses) []  - 0 Peripheral Arterial Disease Assessment (using hand held doppler) ASSESSMENTS - Ostomy and/or Continence Assessment and Care []  - 0 Incontinence Assessment and Management []  - 0 Ostomy Care Assessment and Management (repouching, etc.) PROCESS - Coordination of Care X - Simple Patient / Family Education for ongoing care 1 15 []  - 0 Complex (extensive) Patient / Family Education for ongoing care X- 1 10 Staff obtains Programmer, systems, Records, T Results / Process Orders est []  - 0 Staff telephones HHA, Nursing Homes / Clarify orders / etc []  - 0 Routine Transfer to another Facility (non-emergent condition) []  - 0 Routine Hospital Admission (non-emergent condition) X- 1 15 New Admissions / Biomedical engineer / Ordering NPWT Apligraf, etc. , []  - 0 Emergency Hospital Admission (emergent condition) X- 1 10 Simple Discharge Coordination []  - 0 Complex (extensive) Discharge Coordination PROCESS - Special Needs []  - 0 Pediatric / Minor Patient Management []  - 0 Isolation Patient Management []  -  0 Hearing / Language / Visual special needs []  - 0 Assessment of Community assistance (transportation, D/C planning, etc.) []  - 0 Additional assistance / Altered mentation []  - 0 Support Surface(s) Assessment (bed, cushion, seat, etc.) INTERVENTIONS - Wound Cleansing / Measurement []  - 0 Simple Wound Cleansing - one wound []  - 0 Complex Wound Cleansing - multiple wounds []  - 0 Wound  Imaging (photographs - any number of wounds) []  - 0 Wound Tracing (instead of photographs) []  - 0 Simple Wound Measurement - one wound []  - 0 Complex Wound Measurement - multiple wounds INTERVENTIONS - Wound Dressings []  - 0 Small Wound Dressing one or multiple wounds []  - 0 Medium Wound Dressing one or multiple wounds []  - 0 Large Wound Dressing one or multiple wounds []  - 0 Application of Medications - topical []  - 0 Application of Medications - injection INTERVENTIONS - Miscellaneous []  - 0 External ear exam []  - 0 Specimen Collection (cultures, biopsies, blood, body fluids, etc.) []  - 0 Specimen(s) / Culture(s) sent or taken to Lab for analysis []  - 0 Patient Transfer (multiple staff / Civil Service fast streamer / Similar devices) []  - 0 Simple Staple / Suture removal (25 or less) []  - 0 Complex Staple / Suture removal (26 or more) []  - 0 Hypo / Hyperglycemic Management (close monitor of Blood Glucose) []  - 0 Ankle / Brachial Index (ABI) - do not check if billed separately X- 1 5 Vital Signs Has the patient been seen at the hospital within the last three years: Yes Total Score: 70 Level Of Care: New/Established - Level 2 Electronic Signature(s) Signed: 12/19/2020 8:00:33 AM By: Robert Hammock RN Entered By: [name] on 11/07/2020 14:46:36 -------------------------------------------------------------------------------- Encounter Discharge Information Details Patient Name: Date of Service: [name], [name] 11/07/2020 1:15 PM Medical Record Number: [id number] Patient Account Number:  [id number] Date of Birth/Sex: Treating RN: 22-Aug-1933 (87 y.o. [name]: [name] Other Clinician: Referring [name]: Treating [name]/Extender: [name]: 0 Encounter Discharge Information Items Discharge Condition: Stable Ambulatory Status: Ambulatory Discharge Destination: Home Transportation: Private Auto Accompanied By: Wife Schedule Follow-up Appointment: Yes Clinical Summary of Care: Provided on 11/07/2020 Form Type Recipient Paper Patient Patient Electronic Signature(s) Signed: 11/07/2020 5:00:20 PM By: Lorrin Jackson Entered By: Lorrin Jackson on 11/07/2020 17:00:20 -------------------------------------------------------------------------------- Multi Wound Chart Details Patient Name: Date of Service: [name], [name] 11/07/2020 1:15 PM Medical Record Number: [id number] Patient Account Number: [id number] Date of Birth/Sex: Treating RN: 1933/09/08 (87 y.o. [name] Primary Care Yaw Escoto: [name] Other Clinician: Referring Luise Yamamoto: Treating Hameed Kolar/Extender: [name]: 0 Vital Signs Height(in): 72 Pulse(bpm): 74 Weight(lbs): 236 Blood Pressure(mmHg): 127/65 Body Mass Index(BMI): 32 Temperature(F): 98.5 Respiratory Rate(breaths/min): 18 Wound Assessments Acosta Notes Electronic Signature(s) Signed: 11/10/2020 11:51:38 PM By: [name] DO Signed: 12/19/2020 8:00:33 AM By: Robert Hammock RN Entered By: [name] on 11/10/2020 23:51:38 -------------------------------------------------------------------------------- Multi-Disciplinary Care Plan Details Patient Name: Date of Service: [name], [name] 11/07/2020 1:15 PM Medical Record Number: [id number] Patient Account Number: [id number] Date of Birth/Sex: Treating RN: [birth year] (87 y.o. [name] Primary Care Eryca Bolte: [name] Other  Clinician: Referring Zeynab Klett: Treating Oshua Mcconaha/Extender: [name]: 0 Active Inactive Electronic Signature(s) Signed: 04/26/2021 12:40:15 PM By: Robert Hammock RN Previous Signature: 12/19/2020 8:00:33 AM Version By: Robert Hammock RN Entered By: [name] on 12/19/2020 11:47:38 -------------------------------------------------------------------------------- Pain Assessment Details Patient Name: Date of Service: [name], [name] 11/07/2020 1:15 PM Medical Record Number: [id number] Patient Account Number: [id number] Date of Birth/Sex: Treating RN: 1933/05/14 (87 y.o. [name] Primary Care Lonzell Dorris: [name] Other Clinician: Referring Chealsea Paske: Treating Zakari Bathe/Extender: [name]: 0 Active Problems Location of Pain Severity and Description of Pain Patient Has Paino Yes Site Locations Duration of the Pain. Constant / Intermittento Intermittent Rate the pain. Current Pain  Level: 0 Worst Pain Level: 3 Least Pain Level: 0 Character of Pain Describe the Pain: Other: pressure when passing clots Pain Management and Medication Current Pain Management: Is the Current Pain Management Adequate: Adequate How does your wound impact your activities of daily livingo Sleep: No Bathing: No Appetite: No Relationship With Others: No Bladder Continence: No Emotions: No Bowel Continence: No Work: No Toileting: No Drive: No Dressing: No Hobbies: No Electronic Signature(s) Signed: 11/07/2020 5:31:21 PM By: Baruch Gouty RN, BSN Entered By: Baruch Gouty on 11/07/2020 14:11:44 -------------------------------------------------------------------------------- Patient/Caregiver Education Details Patient Name: Date of Service: [name] 8/2/2022andnbsp1:15 PM Medical Record Number: [id number] Patient Account Number: [id number] Date of Birth/Gender: Treating  RN: Dec 10, 1933 (86 y.o. [name] Primary Care Physician: [name] Other Clinician: Referring Physician: Treating Physician/Extender: [name]: 0 Education Assessment Education Provided To: Patient Education Topics Provided Welcome T [facility]: o Methods: Explain/Verbal Responses: State content correctly Electronic Signature(s) Signed: 12/19/2020 8:00:33 AM By: Robert Hammock RN Entered By: [name] on 11/07/2020 14:45:44 -------------------------------------------------------------------------------- Vitals Details Patient Name: Date of Service: [name], [name] 11/07/2020 1:15 PM Medical Record Number: [id number] Patient Account Number: [id number] Date of Birth/Sex: Treating RN: 07/27/1933 (87 y.o. [name] Primary Care Alejandria Wessells: [name] Other Clinician: Referring Sindhu Nguyen: Treating Tymere Depuy/Extender: [name]: 0 Vital Signs Time Taken: 13:48 Temperature (F): 98.5 Height (in): 72 Pulse (bpm): 74 Source: Stated Respiratory Rate (breaths/min): 18 Weight (lbs): 236 Blood Pressure (mmHg): 127/65 Source: Stated Reference Range: 80 - 120 mg / dl Body Mass Index (BMI): 32 Notes pt does not check blood sugars daily Electronic Signature(s) Signed: 11/07/2020 5:31:21 PM By: Baruch Gouty RN, BSN Entered By: Baruch Gouty on 11/07/2020 13:50:10

## 2020-12-22 ENCOUNTER — Other Ambulatory Visit: Payer: Self-pay | Admitting: Cardiovascular Disease

## 2020-12-22 DIAGNOSIS — R0600 Dyspnea, unspecified: Secondary | ICD-10-CM

## 2020-12-22 DIAGNOSIS — I2510 Atherosclerotic heart disease of native coronary artery without angina pectoris: Secondary | ICD-10-CM

## 2020-12-22 DIAGNOSIS — E785 Hyperlipidemia, unspecified: Secondary | ICD-10-CM

## 2020-12-22 DIAGNOSIS — R0609 Other forms of dyspnea: Secondary | ICD-10-CM

## 2020-12-22 DIAGNOSIS — I472 Ventricular tachycardia: Secondary | ICD-10-CM

## 2020-12-22 DIAGNOSIS — I4729 Other ventricular tachycardia: Secondary | ICD-10-CM

## 2020-12-22 DIAGNOSIS — I10 Essential (primary) hypertension: Secondary | ICD-10-CM

## 2021-02-07 ENCOUNTER — Ambulatory Visit: Payer: Medicare HMO | Admitting: Cardiovascular Disease

## 2021-02-07 ENCOUNTER — Other Ambulatory Visit: Payer: Self-pay

## 2021-02-07 ENCOUNTER — Encounter: Payer: Self-pay | Admitting: Cardiovascular Disease

## 2021-02-07 VITALS — BP 136/62 | HR 78 | Ht 72.0 in | Wt 242.0 lb

## 2021-02-07 DIAGNOSIS — E785 Hyperlipidemia, unspecified: Secondary | ICD-10-CM | POA: Diagnosis not present

## 2021-02-07 DIAGNOSIS — I10 Essential (primary) hypertension: Secondary | ICD-10-CM

## 2021-02-07 DIAGNOSIS — R0989 Other specified symptoms and signs involving the circulatory and respiratory systems: Secondary | ICD-10-CM

## 2021-02-07 DIAGNOSIS — I4729 Other ventricular tachycardia: Secondary | ICD-10-CM | POA: Diagnosis not present

## 2021-02-07 DIAGNOSIS — Z9861 Coronary angioplasty status: Secondary | ICD-10-CM

## 2021-02-07 DIAGNOSIS — I2510 Atherosclerotic heart disease of native coronary artery without angina pectoris: Secondary | ICD-10-CM | POA: Diagnosis not present

## 2021-02-07 DIAGNOSIS — R600 Localized edema: Secondary | ICD-10-CM

## 2021-02-07 DIAGNOSIS — R0609 Other forms of dyspnea: Secondary | ICD-10-CM

## 2021-02-07 DIAGNOSIS — I359 Nonrheumatic aortic valve disorder, unspecified: Secondary | ICD-10-CM | POA: Insufficient documentation

## 2021-02-07 LAB — LIPID PANEL
Chol/HDL Ratio: 3.3 ratio (ref 0.0–5.0)
Cholesterol, Total: 125 mg/dL (ref 100–199)
HDL: 38 mg/dL — ABNORMAL LOW (ref >39)
LDL Chol Calc (NIH): 67 mg/dL (ref 0–99)
Triglycerides: 111 mg/dL (ref 0–149)
VLDL Cholesterol Cal: 20 mg/dL (ref 5–40)

## 2021-02-07 LAB — HEPATIC FUNCTION PANEL
ALT: 12 IU/L (ref 0–44)
AST: 12 IU/L (ref 0–40)
Albumin: 3.6 g/dL (ref 3.6–4.6)
Alkaline Phosphatase: 52 IU/L (ref 44–121)
Bilirubin Total: 0.3 mg/dL (ref 0.0–1.2)
Bilirubin, Direct: <0.1 mg/dL (ref 0.00–0.40)
Total Protein: 6 g/dL (ref 6.0–8.5)

## 2021-02-07 MED ORDER — FUROSEMIDE 40 MG PO TABS: 40.0000 mg | ORAL_TABLET | Freq: Two times a day (BID) | ORAL | 3 refills | Status: DC

## 2021-02-07 NOTE — Assessment & Plan Note (Signed)
History of essential hypertension a blood pressure measured today at 136/62.  He is on carvedilol and lisinopril.

## 2021-02-07 NOTE — Assessment & Plan Note (Signed)
History of CAD status post LAD stenting by myself October 2009 with a Promus drug-eluting stent.  He had a normal Myoview 04/11/2011.  He denies chest pain.

## 2021-02-07 NOTE — Patient Instructions (Signed)
Medication Instructions:  Your physician recommends that you continue on your current medications as directed. Please refer to the Current Medication list given to you today.  *If you need a refill on your cardiac medications before your next appointment, please call your pharmacy*   Lab Work: Your physician recommends that you have labs drawn today: Lipid/liver profile.  If you have labs (blood work) drawn today and your tests are completely normal, you will receive your results only by: [place] (if you have MyChart) OR A paper copy in the mail If you have any lab test that is abnormal or we need to change your treatment, we will call you to review the results.   Testing/Procedures: Your physician has requested that you have a carotid duplex. This test is an ultrasound of the carotid arteries in your neck. It looks at blood flow through these arteries that supply the brain with blood. Allow one hour for this exam. There are no restrictions or special instructions. This procedure is done at [facility].  Your physician has requested that you have an echocardiogram. Echocardiography is a painless test that uses sound waves to create images of your heart. It provides your doctor with information about the size and shape of your heart and how well your heart's chambers and valves are working. This procedure takes approximately one hour. There are no restrictions for this procedure. To be done in June 2023. This procedure is done at [address].    Follow-Up: At [facility], you and your health needs are our priority.  As part of our continuing mission to provide you with exceptional heart care, we have created designated Provider Care Teams.  These Care Teams include your primary Cardiologist (physician) and Advanced Practice Providers (APPs -  Physician Assistants and Nurse Practitioners) who all work together to provide you with the care you need, when you need it.  We  recommend signing up for the patient portal called "MyChart".  Sign up information is provided on this After Visit Summary.  MyChart is used to connect with patients for Virtual Visits (Telemedicine).  Patients are able to view lab/test results, encounter notes, upcoming appointments, etc.  Non-urgent messages can be sent to your provider as well.   To learn more about what you can do with MyChart, go to [email or web address].    Your next appointment:   12 month(s)  The format for your next appointment:   In Person  Provider:   Quay Burow, MD

## 2021-02-07 NOTE — Assessment & Plan Note (Signed)
History of aortic valve disease with 2D echo performed 08/12/2020 revealing moderate aortic insufficiency and mild to moderate aortic stenosis with normal LV function.  There was a slight increase in LV size.  We will recheck a 2D echo in 1 year.

## 2021-02-07 NOTE — Assessment & Plan Note (Signed)
History of dyslipidemia on statin therapy with lipid profile performed 10/07/2019 revealing total cholesterol 152, LDL of 83 and HDL 43.  We will recheck a lipid liver profile.

## 2021-02-07 NOTE — Progress Notes (Signed)
02/07/2021 [name]   [birth year]  [id number]  Primary Physician Robyne Peers, MD Primary Cardiologist: Lorretta Harp MD FACP, [place], [place], [place]  HPI:  [name] is a 87 y.o.  moderately overweight, recently remarried for the third time (09/30/14) [place] American male, father of 3 children and 4 stepchildren, grandfather to 22 grandchildren, who is formerly a patient of Dr. Francine Graven.  He is accompanied by his wife [name] today.  I last saw him    11/23/2019. He has a history of CAD status post LAD stenting with a Promus drug-eluting stent, October of 2009. He had attempt at VT ablation by Dr. Thompson Grayer, February 2010; however, this was aborted because of inappropriate substrate. His other problems include hypertension, hyperlipidemia, diabetes, as well as history of prostate cancer. Since I saw him, he has been asymptomatic. His last Myoview performed 04/11/11 was completely normal. He did stop his aspirin because of rectal bleeding is noticed increased evening flushing with Niaspan. His most recent lipid profile performed  02/13/2018 revealed total cholesterol 137, LDL 62 and HDL 39.   Since I saw him in the office over a year ago he continues to do well.  He has had some issues with hematuria and prostate cancer.  He was seen because of profound anemia secondary to untreated hematuria and this was addressed by a urologist.  He is on iron repletion therapy.  He does complain of some dyspnea but denies chest pain.  His most recent 2D echo performed 09/12/2020 revealed normal LV function, with grade 2 diastolic dysfunction, moderate AI and mild to moderate AS.   Current Meds  Medication Sig   acetaminophen (TYLENOL) 500 MG tablet Take 1,000 mg by mouth daily.   amLODipine (NORVASC) 10 MG tablet TAKE 1 TABLET EVERY DAY (Patient taking differently: Take 10 mg by mouth daily.)   atorvastatin (LIPITOR) 10 MG tablet Take 1 tablet (10 mg total) by mouth daily. Patients needs Office  Visit for future refills   Blood Glucose Monitoring Suppl (ACCU-CHEK AVIVA PLUS) w/Device KIT Test blood sugar 3 times daily. Dx code: E11.22   Blood Pressure Monitoring (BLOOD PRESS MONITOR/M-L CUFF) MISC 1 application by Does not apply route daily.   carvedilol (COREG) 25 MG tablet Take 1 tablet (25 mg total) by mouth 2 (two) times daily. KEEP OV.   Continuous Blood Gluc Sensor (FREESTYLE LIBRE SENSOR SYSTEM) MISC 1 application by Does not apply route daily.   Continuous Glucose Monitor Sup KIT 1 kit by Does not apply route as directed.   glucose blood (ACCU-CHEK AVIVA PLUS) test strip Test blood sugar 3 times daily. Dx code: E11.22   insulin NPH-regular Human (NOVOLIN 70/30) (70-30) 100 UNIT/ML injection Inject 11 Units into the skin 2 (two) times daily with a meal. (Patient taking differently: Inject 15 Units into the skin 2 (two) times daily with a meal.)   Insulin Syringe-Needle U-100 (B-D INS SYR HALF-UNIT .3CC/31G) 31G X 5/16" 0.3 ML MISC Use daily at bedtime to inject insulin. Dx code: 250.00   Lancets (ACCU-CHEK MULTICLIX) lancets Test blood sugar 3 times daily. Dx code: E11.22   lisinopril (ZESTRIL) 40 MG tablet Take 40 mg by mouth daily.   Multiple Vitamin (MULTIVITAMIN) tablet Take 1 tablet by mouth daily. COMPLETE SENIOR VITAMINS AND M   Omega-3 Fatty Acids (FISH OIL) 1000 MG CAPS Take 1 capsule by mouth daily.   Vitamin D, Cholecalciferol, 1000 UNITS TABS Take 1 tablet by mouth daily.    [  DISCONTINUED] furosemide (LASIX) 40 MG tablet Take 1 tablet (40 mg total) by mouth 2 (two) times daily. Patient needs Office Visit for future refills     No Known Allergies  Social History   Socioeconomic History   Marital status: Married    Spouse name: Not on file   Number of children: 7   Years of education: Not on file   Highest education level: Not on file  Occupational History   Occupation: RETIRED    Employer: RETIRED  Tobacco Use   Smoking status: Former    Packs/day: 0.00     Years: 15.00    Pack years: 0.00    Types: Cigarettes    Quit date: 05/10/2017    Years since quitting: 3.7   Smokeless tobacco: Never   Tobacco comments:    Patient smokes occasionally. Not everyday.  Vaping Use   Vaping Use: Never used  Substance and Sexual Activity   Alcohol use: No   Drug use: No   Sexual activity: Never  Other Topics Concern   Not on file  Social History Narrative   Not on file   Social Determinants of Health   Financial Resource Strain: Not on file  Food Insecurity: Not on file  Transportation Needs: Not on file  Physical Activity: Not on file  Stress: Not on file  Social Connections: Not on file  Intimate Partner Violence: Not on file     Review of Systems: General: negative for chills, fever, night sweats or weight changes.  Cardiovascular: negative for chest pain, dyspnea on exertion, edema, orthopnea, palpitations, paroxysmal nocturnal dyspnea or shortness of breath Dermatological: negative for rash Respiratory: negative for cough or wheezing Urologic: negative for hematuria Abdominal: negative for nausea, vomiting, diarrhea, bright red blood per rectum, melena, or hematemesis Neurologic: negative for visual changes, syncope, or dizziness All other systems reviewed and are otherwise negative except as noted above.    Blood pressure 136/62, pulse 78, height 6' (1.829 m), weight 242 lb (109.8 kg).  General appearance: alert and no distress Neck: no adenopathy, no JVD, supple, symmetrical, trachea midline, thyroid not enlarged, symmetric, no tenderness/mass/nodules, and right carotid bruit Lungs: clear to auscultation bilaterally Heart: Soft systolic and diastolic murmur at the left upper sternal border consistent with AS/ AI. Extremities: 1+ bilateral lower extremity edema Pulses: 2+ and symmetric Skin: Skin color, texture, turgor normal. No rashes or lesions Neurologic: Grossly normal  EKG not performed today  ASSESSMENT AND PLAN:   CAD  S/P percutaneous coronary angioplasty History of CAD status post LAD stenting by myself October 2009 with a Promus drug-eluting stent.  He had a normal Myoview 04/11/2011.  He denies chest pain.  Essential hypertension History of essential hypertension a blood pressure measured today at 136/62.  He is on carvedilol and lisinopril.  Dyslipidemia, goal LDL below 70 History of dyslipidemia on statin therapy with lipid profile performed 10/07/2019 revealing total cholesterol 152, LDL of 83 and HDL 43.  We will recheck a lipid liver profile.  Lower extremity edema History of lower extremity edema on furosemide and compression stockings.  Aortic valve disease History of aortic valve disease with 2D echo performed 08/12/2020 revealing moderate aortic insufficiency and mild to moderate aortic stenosis with normal LV function.  There was a slight increase in LV size.  We will recheck a 2D echo in 1 year.     Lorretta Harp MD FACP,FACC,FAHA, [facility] 02/07/2021 9:07 AM

## 2021-02-07 NOTE — Assessment & Plan Note (Signed)
History of lower extremity edema on furosemide and compression stockings.

## 2021-02-09 ENCOUNTER — Telehealth: Payer: Self-pay | Admitting: Cardiovascular Disease

## 2021-02-09 DIAGNOSIS — R0609 Other forms of dyspnea: Secondary | ICD-10-CM

## 2021-02-09 DIAGNOSIS — I4729 Other ventricular tachycardia: Secondary | ICD-10-CM

## 2021-02-09 DIAGNOSIS — I2510 Atherosclerotic heart disease of native coronary artery without angina pectoris: Secondary | ICD-10-CM

## 2021-02-09 DIAGNOSIS — I10 Essential (primary) hypertension: Secondary | ICD-10-CM

## 2021-02-09 MED ORDER — FUROSEMIDE 40 MG PO TABS: 40.0000 mg | ORAL_TABLET | Freq: Two times a day (BID) | ORAL | 3 refills | Status: DC

## 2021-02-09 NOTE — Telephone Encounter (Signed)
Refills has been sent to the pharmacy. 

## 2021-02-09 NOTE — Telephone Encounter (Signed)
Pt c/o medication issue:  1. Name of Medication: furosemide (LASIX) 40 MG tablet  2. How are you currently taking this medication (dosage and times per day)? Take 1 tablet (40 mg total) by mouth 2 (two) times daily. Patient needs Office Visit for future refills  3. Are you having a reaction (difficulty breathing--STAT)? no  4. What is your medication issue? Medication was sent to [facility] and shouldve instead went to  [place], [place]. /// pt is requesting that [facility] order be cancelled.

## 2021-02-15 ENCOUNTER — Other Ambulatory Visit: Payer: Self-pay | Admitting: Cardiovascular Disease

## 2021-02-15 ENCOUNTER — Other Ambulatory Visit: Payer: Self-pay | Admitting: Urology

## 2021-02-15 DIAGNOSIS — E785 Hyperlipidemia, unspecified: Secondary | ICD-10-CM

## 2021-02-16 NOTE — Progress Notes (Signed)
Sent message, via epic in basket, requesting orders in epic from surgeon.  

## 2021-02-19 ENCOUNTER — Ambulatory Visit (HOSPITAL_COMMUNITY)
Admission: RE | Admit: 2021-02-19 | Discharge: 2021-02-19 | Disposition: A | Discharge status: Home / Self Care | Payer: Medicare HMO | Source: Ambulatory Visit | Attending: Cardiology | Admitting: Cardiology

## 2021-02-19 ENCOUNTER — Other Ambulatory Visit: Payer: Self-pay

## 2021-02-19 DIAGNOSIS — R0989 Other specified symptoms and signs involving the circulatory and respiratory systems: Secondary | ICD-10-CM | POA: Diagnosis not present

## 2021-02-19 DIAGNOSIS — E785 Hyperlipidemia, unspecified: Secondary | ICD-10-CM | POA: Insufficient documentation

## 2021-03-05 NOTE — Patient Instructions (Addendum)
DUE TO COVID-19 ONLY ONE VISITOR IS ALLOWED TO COME WITH YOU AND STAY IN THE WAITING ROOM ONLY DURING PRE OP AND PROCEDURE.   **NO VISITORS ARE ALLOWED IN THE SHORT STAY AREA OR RECOVERY ROOM!!**  IF YOU WILL BE ADMITTED INTO THE HOSPITAL YOU ARE ALLOWED ONLY TWO SUPPORT PEOPLE DURING VISITATION HOURS ONLY (7 AM -8PM)   The support person(s) must pass our screening, gel in and out, and wear a mask at all times, including in the patient's room. Patients must also wear a mask when staff or their support person are in the room. Visitors GUEST BADGE MUST BE WORN VISIBLY  One adult visitor may remain with you overnight and MUST be in the room by 8 P.M.  No visitors under the age of 25. Any visitor under the age of 12 must be accompanied by an adult.        Your procedure is scheduled on: 03/12/21   Report to [facility] Main Entrance    Report to admitting at : 9:45 PM   Call this number if you have problems the morning of surgery [phone]   Do not eat food :After Midnight.   May have liquids until: 9:00 AM   day of surgery  CLEAR LIQUID DIET  Foods Allowed                                                                     Foods Excluded  Water, Black Coffee and tea, regular and decaf                             liquids that you cannot  Plain Jell-O in any flavor  (No red)                                           see through such as: Fruit ices (not with fruit pulp)                                     milk, soups, orange juice              Iced Popsicles (No red)                                    All solid food                                   Apple juices Sports drinks like Gatorade (No red) Lightly seasoned clear broth or consume(fat free) Sugar  Sample Menu Breakfast                                Lunch  Supper Cranberry juice                    Beef broth                            Chicken broth Jell-O                                      Grape juice                           Apple juice Coffee or tea                        Jell-O                                      Popsicle                                                Coffee or tea                        Coffee or tea     Oral Hygiene is also important to reduce your risk of infection.                                    Remember - BRUSH YOUR TEETH THE MORNING OF SURGERY WITH YOUR REGULAR TOOTHPASTE   Do NOT smoke after Midnight   Take these medicines the morning of surgery with A SIP OF WATER: metoprolol,mirabegron.  How to Manage Your Diabetes Before and After Surgery  Why is it important to control my blood sugar before and after surgery? Improving blood sugar levels before and after surgery helps healing and can limit problems. A way of improving blood sugar control is eating a healthy diet by:  Eating less sugar and carbohydrates  Increasing activity/exercise  Talking with your doctor about reaching your blood sugar goals High blood sugars (greater than 180 mg/dL) can raise your risk of infections and slow your recovery, so you will need to focus on controlling your diabetes during the weeks before surgery. Make sure that the doctor who takes care of your diabetes knows about your planned surgery including the date and location.  How do I manage my blood sugar before surgery? Check your blood sugar at least 4 times a day, starting 2 days before surgery, to make sure that the level is not too high or low. Check your blood sugar the morning of your surgery when you wake up and every 2 hours until you get to the Short Stay unit. If your blood sugar is less than 70 mg/dL, you will need to treat for low blood sugar: Do not take insulin. Treat a low blood sugar (less than 70 mg/dL) with  cup of clear juice (cranberry or apple), 4 glucose tablets, OR glucose gel. Recheck blood sugar in 15 minutes after treatment (to make sure it is greater than 70 mg/dL). If  your blood  sugar is not greater than 70 mg/dL on recheck, call [phone] for further instructions. Report your blood sugar to the short stay nurse when you get to Short Stay.  If you are admitted to the hospital after surgery: Your blood sugar will be checked by the staff and you will probably be given insulin after surgery (instead of oral diabetes medicines) to make sure you have good blood sugar levels. The goal for blood sugar control after surgery is 80-180 mg/dL.   WHAT DO I DO ABOUT MY DIABETES MEDICATION?  Do not take oral diabetes medicines (pills) the morning of surgery.  THE DAY BEFORE SURGERY, take insulin in the morning as usual.Take ONLY 70 per cent of the dose (10 U)     THE MORNING OF SURGERY, DO NOT take any insulin.                              You may not have any metal on your body including hair pins, jewelry, and body piercing             Do not wear lotions, powders, perfumes/cologne, or deodorant              Men may shave face and neck.   Do not bring valuables to the hospital. [place].   Contacts, dentures or bridgework may not be worn into surgery.   Bring small overnight bag day of surgery.    Patients discharged on the day of surgery will not be allowed to drive home.   Special Instructions: Bring a copy of your healthcare power of attorney and living will documents         the day of surgery if you haven't scanned them before.              Please read over the following fact sheets you were given: IF YOU HAVE QUESTIONS ABOUT YOUR PRE-OP INSTRUCTIONS PLEASE CALL [phone]     [facility] - Preparing for Surgery Before surgery, you can play an important role.  Because skin is not sterile, your skin needs to be as free of germs as possible.  You can reduce the number of germs on your skin by washing with CHG (chlorahexidine gluconate) soap before surgery.  CHG is an antiseptic cleaner which kills  germs and bonds with the skin to continue killing germs even after washing. Please DO NOT use if you have an allergy to CHG or antibacterial soaps.  If your skin becomes reddened/irritated stop using the CHG and inform your nurse when you arrive at Short Stay. Do not shave (including legs and underarms) for at least 48 hours prior to the first CHG shower.  You may shave your face/neck. Please follow these instructions carefully:  1.  Shower with CHG Soap the night before surgery and the  morning of Surgery.  2.  If you choose to wash your hair, wash your hair first as usual with your  normal  shampoo.  3.  After you shampoo, rinse your hair and body thoroughly to remove the  shampoo.                           4.  Use CHG as you would any other liquid soap.  You can apply chg directly  to  the skin and wash                       Gently with a scrungie or clean washcloth.  5.  Apply the CHG Soap to your body ONLY FROM THE NECK DOWN.   Do not use on face/ open                           Wound or open sores. Avoid contact with eyes, ears mouth and genitals (private parts).                       Wash face,  Genitals (private parts) with your normal soap.             6.  Wash thoroughly, paying special attention to the area where your surgery  will be performed.  7.  Thoroughly rinse your body with warm water from the neck down.  8.  DO NOT shower/wash with your normal soap after using and rinsing off  the CHG Soap.                9.  Pat yourself dry with a clean towel.            10.  Wear clean pajamas.            11.  Place clean sheets on your bed the night of your first shower and do not  sleep with pets. Day of Surgery : Do not apply any lotions/deodorants the morning of surgery.  Please wear clean clothes to the hospital/surgery center.  FAILURE TO FOLLOW THESE INSTRUCTIONS MAY RESULT IN THE CANCELLATION OF YOUR SURGERY PATIENT SIGNATURE_________________________________  NURSE  SIGNATURE__________________________________  ________________________________________________________________________

## 2021-03-06 ENCOUNTER — Encounter (HOSPITAL_COMMUNITY): Payer: Self-pay

## 2021-03-06 ENCOUNTER — Other Ambulatory Visit: Payer: Self-pay

## 2021-03-06 ENCOUNTER — Encounter (HOSPITAL_COMMUNITY)
Admission: RE | Admit: 2021-03-06 | Discharge: 2021-03-06 | Disposition: A | Discharge status: Home / Self Care | Payer: Medicare HMO | Source: Ambulatory Visit | Attending: Urology | Admitting: Urology

## 2021-03-06 VITALS — BP 163/71 | HR 79 | Temp 97.8°F | Ht 72.0 in | Wt 234.0 lb

## 2021-03-06 DIAGNOSIS — I2510 Atherosclerotic heart disease of native coronary artery without angina pectoris: Secondary | ICD-10-CM | POA: Insufficient documentation

## 2021-03-06 DIAGNOSIS — Z87891 Personal history of nicotine dependence: Secondary | ICD-10-CM | POA: Diagnosis not present

## 2021-03-06 DIAGNOSIS — I130 Hypertensive heart and chronic kidney disease with heart failure and stage 1 through stage 4 chronic kidney disease, or unspecified chronic kidney disease: Secondary | ICD-10-CM | POA: Diagnosis not present

## 2021-03-06 DIAGNOSIS — Z01812 Encounter for preprocedural laboratory examination: Secondary | ICD-10-CM | POA: Insufficient documentation

## 2021-03-06 DIAGNOSIS — E1122 Type 2 diabetes mellitus with diabetic chronic kidney disease: Secondary | ICD-10-CM | POA: Diagnosis not present

## 2021-03-06 DIAGNOSIS — Z8551 Personal history of malignant neoplasm of bladder: Secondary | ICD-10-CM | POA: Insufficient documentation

## 2021-03-06 DIAGNOSIS — Z955 Presence of coronary angioplasty implant and graft: Secondary | ICD-10-CM | POA: Insufficient documentation

## 2021-03-06 DIAGNOSIS — N189 Chronic kidney disease, unspecified: Secondary | ICD-10-CM | POA: Diagnosis not present

## 2021-03-06 DIAGNOSIS — N35919 Unspecified urethral stricture, male, unspecified site: Secondary | ICD-10-CM | POA: Diagnosis not present

## 2021-03-06 DIAGNOSIS — I509 Heart failure, unspecified: Secondary | ICD-10-CM | POA: Insufficient documentation

## 2021-03-06 DIAGNOSIS — I10 Essential (primary) hypertension: Secondary | ICD-10-CM | POA: Insufficient documentation

## 2021-03-06 HISTORY — DX: Chronic kidney disease, unspecified: N18.9

## 2021-03-06 LAB — CBC
HCT: 40.3 % (ref 39.0–52.0)
Hemoglobin: 11.9 g/dL — ABNORMAL LOW (ref 13.0–17.0)
MCH: 23.8 pg — ABNORMAL LOW (ref 26.0–34.0)
MCHC: 29.5 g/dL — ABNORMAL LOW (ref 30.0–36.0)
MCV: 80.6 fL (ref 80.0–100.0)
Platelets: 253 10*3/uL (ref 150–400)
RBC: 5 MIL/uL (ref 4.22–5.81)
RDW: 19.5 % — ABNORMAL HIGH (ref 11.5–15.5)
WBC: 8.4 10*3/uL (ref 4.0–10.5)
nRBC: 0 % (ref 0.0–0.2)

## 2021-03-06 LAB — BASIC METABOLIC PANEL
Anion gap: 5 (ref 5–15)
BUN: 31 mg/dL — ABNORMAL HIGH (ref 8–23)
CO2: 22 mmol/L (ref 22–32)
Calcium: 8.5 mg/dL — ABNORMAL LOW (ref 8.9–10.3)
Chloride: 113 mmol/L — ABNORMAL HIGH (ref 98–111)
Creatinine, Ser: 2.45 mg/dL — ABNORMAL HIGH (ref 0.61–1.24)
GFR, Estimated: 25 mL/min — ABNORMAL LOW (ref >60)
Glucose, Bld: 85 mg/dL (ref 70–99)
Potassium: 4.6 mmol/L (ref 3.5–5.1)
Sodium: 140 mmol/L (ref 135–145)

## 2021-03-06 LAB — GLUCOSE, CAPILLARY: Glucose-Capillary: 82 mg/dL (ref 70–99)

## 2021-03-06 NOTE — Progress Notes (Signed)
COVID Vaccine Completed:Yes Date COVID Vaccine completed: 02/2021 x 2 COVID vaccine manufacturer: Pfizer     COVID Test: [name].  PCP - Dr. Rolan Lipa Cardiologist - Dr. Quay Burow. LOV: 02/07/21  Chest x-ray - 11/08/20 EKG - 11/15/20 Stress Test -  ECHO - 09/12/20 Cardiac Cath -  Pacemaker/ICD device last checked: A1C: 6.6: 12/28/20 Sleep Study -  CPAP -   Fasting Blood Sugar - 120's Checks Blood Sugar ___3__ times a week  Blood Thinner Instructions: Aspirin Instructions: Last Dose:  Anesthesia review: Hx: DIA,HTN,CHF,PVC's,CAD,Ventricular tachycardia.Pt. still very active,goes up a flight of steps without getting SOB.  Patient denies shortness of breath, fever, cough and chest pain at PAT appointment   Patient verbalized understanding of instructions that were given to them at the PAT appointment. Patient was also instructed that they will need to review over the PAT instructions again at home before surgery.

## 2021-03-06 NOTE — Progress Notes (Signed)
Lab. Results: Creatinine: 2.45

## 2021-03-07 NOTE — Anesthesia Preprocedure Evaluation (Addendum)
Anesthesia Evaluation  Patient identified by MRN, date of birth, ID band Patient awake    Reviewed: Allergy & Precautions, NPO status , Patient's Chart, lab work & pertinent test results  Airway Mallampati: II  TM Distance: >3 FB Neck ROM: Full    Dental  (+) Upper Dentures, Lower Dentures   Pulmonary former smoker,    Pulmonary exam normal        Cardiovascular hypertension, Pt. on medications and Pt. on home beta blockers + CAD, + Cardiac Stents and +CHF  + Valvular Problems/Murmurs AS  Rhythm:Regular Rate:Normal     Neuro/Psych negative neurological ROS  negative psych ROS   GI/Hepatic negative GI ROS, Neg liver ROS,   Endo/Other  diabetes, Type 2, Insulin Dependent  Renal/GU CRFRenal disease Bladder dysfunction  Urethral stricture 2/2 bladder Ca    Musculoskeletal  (+) Arthritis , Osteoarthritis,    Abdominal Normal abdominal exam  (+)   Peds  Hematology  (+) anemia ,   Anesthesia Other Findings   Reproductive/Obstetrics                            Anesthesia Physical Anesthesia Plan  ASA: 3  Anesthesia Plan: General   Post-op Pain Management:    Induction: Intravenous  PONV Risk Score and Plan: 2 and Ondansetron, Dexamethasone and Treatment may vary due to age or medical condition  Airway Management Planned: Mask and LMA  Additional Equipment: None  Intra-op Plan:   Post-operative Plan: Extubation in OR  Informed Consent: I have reviewed the patients History and Physical, chart, labs and discussed the procedure including the risks, benefits and alternatives for the proposed anesthesia with the patient or authorized representative who has indicated his/her understanding and acceptance.     Dental advisory given  Plan Discussed with: CRNA  Anesthesia Plan Comments: (See PAT note 03/06/2021, Konrad Felix Ward, PA-C Lab Results      Component                Value                Date                      WBC                      8.4                 03/06/2021                HGB                      11.9 (L)            03/06/2021                HCT                      40.3                03/06/2021                MCV                      80.6                03/06/2021  PLT                      253                 03/06/2021           Lab Results      Component                Value               Date                      NA                       140                 03/06/2021                K                        4.6                 03/06/2021                CO2                      22                  03/06/2021                GLUCOSE                  85                  03/06/2021                BUN                      31 (H)              03/06/2021                CREATININE               2.45 (H)            03/06/2021                CALCIUM                  8.5 (L)             03/06/2021                GFRNONAA                 25 (L)              03/06/2021           Echo 09/12/2020 1. Left ventricular ejection fraction, by estimation, is 60 to 65%. The  left ventricle has normal function. The left ventricle has no regional  wall motion abnormalities. There is moderate concentric left ventricular  hypertrophy. Left ventricular  diastolic parameters are consistent with Grade II diastolic dysfunction  (pseudonormalization). Elevated left atrial pressure.  2. Right ventricular systolic function is normal. The right ventricular  size is normal. Tricuspid regurgitation signal is inadequate for assessing  PA pressure.  3. Left atrial size was mildly dilated.  4. The mitral valve is abnormal. No evidence of mitral valve  regurgitation. No evidence of mitral stenosis. Moderate mitral annular  calcification.  5. The aortic valve is tricuspid. There is severe calcifcation of the  aortic valve. There is severe thickening of the aortic  valve. Aortic valve  regurgitation is moderate. Mild to moderate aortic valve stenosis. Aortic  regurgitation PHT measures 431  msec. Aortic valve mean gradient measures 16.0 mmHg. Aortic valve Vmax  measures 2.74 m/s.  6. The inferior vena cava is normal in size with greater than 50%  respiratory variability, suggesting right atrial pressure of 3 mmHg. )       Anesthesia Quick Evaluation

## 2021-03-07 NOTE — Progress Notes (Signed)
Anesthesia Chart Review   Case: [id number] Date/Time: 03/12/21 1145   Procedure: CYSTOSCOPY WITH DIRECT VISION INTERNAL  URETHROTOMY OPTILUM URETHRAL DILATION POSSIBLE TRANSURETHRAL RESECTION OF BLADDER TUMOR - RERQUESTING 48 MINS   Anesthesia type: General   Pre-op diagnosis: URETHRAL STRICTURE HISTORY OF BLADDER CANCER   Location: [facility]   Surgeons: Lucas Mallow, MD       DISCUSSION:86 y.o. former smoker with h/o HTN, CAD (DES 2009), CHF, mild AS (mean gradient 16 mmHg, valve area 1.95 cm2 on Echo 09/12/2020), DM II, CKD, prostate cancer, urethral stricture scheduled for above procedure 03/12/2021 with Dr. Link Snuffer.   Pt last seen by cardiology 02/07/21. Stable at this visit.   Anticipate pt can proceed with planned procedure barring acute status change.   VS: BP (!) 163/71   Pulse 79   Temp 36.6 C   Ht 6' (1.829 m)   Wt 106.1 kg   SpO2 100%   BMI 31.74 kg/m   PROVIDERS: Robyne Peers, MD is PCP   Quay Burow, MD is Cardiologist  LABS: Labs reviewed: Acceptable for surgery. (all labs ordered are listed, but only abnormal results are displayed)  Labs Reviewed  CBC - Abnormal; Notable for the following components:      Result Value   Hemoglobin 11.9 (*)    MCH 23.8 (*)    MCHC 29.5 (*)    RDW 19.5 (*)    All other components within normal limits  BASIC METABOLIC PANEL - Abnormal; Notable for the following components:   Chloride 113 (*)    BUN 31 (*)    Creatinine, Ser 2.45 (*)    Calcium 8.5 (*)    GFR, Estimated 25 (*)    All other components within normal limits  GLUCOSE, CAPILLARY     IMAGES:   EKG: 11/14/2020 Rate 70 bpm  NSR  CV: Echo 09/12/2020  1. Left ventricular ejection fraction, by estimation, is 60 to 65%. The  left ventricle has normal function. The left ventricle has no regional  wall motion abnormalities. There is moderate concentric left ventricular  hypertrophy. Left ventricular  diastolic parameters are  consistent with Grade II diastolic dysfunction  (pseudonormalization). Elevated left atrial pressure.   2. Right ventricular systolic function is normal. The right ventricular  size is normal. Tricuspid regurgitation signal is inadequate for assessing  PA pressure.   3. Left atrial size was mildly dilated.   4. The mitral valve is abnormal. No evidence of mitral valve  regurgitation. No evidence of mitral stenosis. Moderate mitral annular  calcification.   5. The aortic valve is tricuspid. There is severe calcifcation of the  aortic valve. There is severe thickening of the aortic valve. Aortic valve  regurgitation is moderate. Mild to moderate aortic valve stenosis. Aortic  regurgitation PHT measures 431  msec. Aortic valve mean gradient measures 16.0 mmHg. Aortic valve Vmax  measures 2.74 m/s.   6. The inferior vena cava is normal in size with greater than 50%  respiratory variability, suggesting right atrial pressure of 3 mmHg.    Past Medical History:  Diagnosis Date   Allergy    Anemia    CAD (coronary artery disease)    pci to LAD 10/09; stable CAD by cath 05/31/08; myoview 04/11/11- no ishcemia, EF 67%; echo 05/15/09- EF>55%, mod calcification of the aortic valve leaflets   CAD S/P percutaneous coronary angioplasty 05/08/2008   LAD PCI with DES 2009- Myoview low risk 2013  CHF (congestive heart failure) ([place]) 09/07/2019   Chronic back pain    Chronic kidney disease    Cystitis, radiation 12/01/2013   ED (erectile dysfunction)    Elevated serum creatinine 12/14/2015   Essential hypertension, benign    Hematuria 12/01/2013   Hyperlipidemia    Ileus ([place]) 12/14/2015   Insulin dependent type 2 diabetes mellitus, controlled ([place])    Malignant neoplasm of prostate ([place]) 11/26/2013   Multiple rib fractures    left 9th, 10th, and 11th posterior rib fractures S/P fall 12/09/2015/notes 12/12/2015   Osteoarthritis of right knee 03/04/2018   PREMATURE VENTRICULAR CONTRACTIONS 05/08/2008    Qualifier: Diagnosis of  By: Rayann Heman, MD, James     Prostate cancer [facility])    s/p   Rectal bleeding 12/23/2012   Evaluated by Dr Benson [facility] GI patient with history of radiation proctitis. Patient due for colonoscopy on 04/2014    Type II or unspecified type diabetes mellitus without mention of complication, not stated as uncontrolled    Ventricular tachycardia    resuscitated; monitor 05/2008; attempted T ablation 2/10- aborted due to inappropriate substrate    Past Surgical History:  Procedure Laterality Date   CARDIAC CATHETERIZATION  05/31/08   EF 55-60%, stable lesions, medical therapy   COLECTOMY  '80's   polyps   CORONARY ANGIOPLASTY WITH STENT PLACEMENT  01/19/08   PCI stent to mid LAD with Promus DES 27.5x28   IR GENERIC HISTORICAL  01/03/2016   IR THORACENTESIS ASP PLEURAL SPACE W/IMG GUIDE 01/03/2016 [facility]-INTERV RAD   KNEE ARTHROPLASTY Right 03/04/2018   Procedure: COMPUTER ASSISTED TOTAL KNEE ARTHROPLASTY;  Surgeon: Rod Can, MD;  Location: [facility];  Service: Orthopedics;  Laterality: Right;   PTCA  01/2008   TRANSURETHRAL RESECTION OF BLADDER TUMOR [name] 11/10/2020   Procedure: Silvestre Moment;  Surgeon: Cleon Gustin, MD;  Location: [facility];  Service: Urology;  Laterality: [name];    MEDICATIONS:  amLODipine (NORVASC) 10 MG tablet   atorvastatin (LIPITOR) 10 MG tablet   Blood Glucose Monitoring Suppl (ACCU-CHEK AVIVA PLUS) w/Device KIT   Blood Pressure Monitoring (BLOOD PRESS MONITOR/M-L CUFF) MISC   carvedilol (COREG) 25 MG tablet   Continuous Blood Gluc Sensor (FREESTYLE LIBRE SENSOR SYSTEM) MISC   Continuous Glucose Monitor Sup KIT   furosemide (LASIX) 40 MG tablet   glucose blood (ACCU-CHEK AVIVA PLUS) test strip   insulin NPH-regular Human (NOVOLIN 70/30) (70-30) 100 UNIT/ML injection   Insulin Syringe-Needle U-100 (B-D INS SYR HALF-UNIT .3CC/31G) 31G X 5/16" 0.3 ML MISC   Lancets (ACCU-CHEK MULTICLIX) lancets   lisinopril (ZESTRIL) 20 MG  tablet   tamsulosin (FLOMAX) 0.4 MG CAPS capsule   No current facility-administered medications for this encounter.     Konrad Felix Ward, PA-C [facility] [phone]

## 2021-03-12 ENCOUNTER — Encounter (HOSPITAL_COMMUNITY)
Admission: RE | Disposition: A | Discharge status: Home / Self Care | Payer: Self-pay | Source: Ambulatory Visit | Attending: Urology

## 2021-03-12 ENCOUNTER — Other Ambulatory Visit: Payer: Self-pay

## 2021-03-12 ENCOUNTER — Ambulatory Visit (HOSPITAL_COMMUNITY): Payer: Medicare HMO | Admitting: Certified Registered Nurse Anesthetist

## 2021-03-12 ENCOUNTER — Ambulatory Visit (HOSPITAL_COMMUNITY): Payer: Medicare HMO | Admitting: Physician Assistant

## 2021-03-12 ENCOUNTER — Ambulatory Visit (HOSPITAL_COMMUNITY): Payer: Medicare HMO

## 2021-03-12 ENCOUNTER — Ambulatory Visit (HOSPITAL_COMMUNITY)
Admission: RE | Admit: 2021-03-12 | Discharge: 2021-03-12 | Disposition: A | Discharge status: Home / Self Care | Payer: Medicare HMO | Source: Ambulatory Visit | Attending: Urology | Admitting: Urology

## 2021-03-12 ENCOUNTER — Encounter (HOSPITAL_COMMUNITY): Payer: Self-pay | Admitting: Urology

## 2021-03-12 DIAGNOSIS — I130 Hypertensive heart and chronic kidney disease with heart failure and stage 1 through stage 4 chronic kidney disease, or unspecified chronic kidney disease: Secondary | ICD-10-CM | POA: Diagnosis not present

## 2021-03-12 DIAGNOSIS — Z8546 Personal history of malignant neoplasm of prostate: Secondary | ICD-10-CM | POA: Diagnosis not present

## 2021-03-12 DIAGNOSIS — Z87891 Personal history of nicotine dependence: Secondary | ICD-10-CM | POA: Diagnosis not present

## 2021-03-12 DIAGNOSIS — E1122 Type 2 diabetes mellitus with diabetic chronic kidney disease: Secondary | ICD-10-CM | POA: Insufficient documentation

## 2021-03-12 DIAGNOSIS — Z8551 Personal history of malignant neoplasm of bladder: Secondary | ICD-10-CM | POA: Insufficient documentation

## 2021-03-12 DIAGNOSIS — N35912 Unspecified bulbous urethral stricture, male: Secondary | ICD-10-CM | POA: Insufficient documentation

## 2021-03-12 DIAGNOSIS — Z794 Long term (current) use of insulin: Secondary | ICD-10-CM | POA: Insufficient documentation

## 2021-03-12 DIAGNOSIS — I2510 Atherosclerotic heart disease of native coronary artery without angina pectoris: Secondary | ICD-10-CM | POA: Diagnosis not present

## 2021-03-12 DIAGNOSIS — I509 Heart failure, unspecified: Secondary | ICD-10-CM | POA: Insufficient documentation

## 2021-03-12 DIAGNOSIS — Z923 Personal history of irradiation: Secondary | ICD-10-CM | POA: Diagnosis not present

## 2021-03-12 DIAGNOSIS — Z955 Presence of coronary angioplasty implant and graft: Secondary | ICD-10-CM | POA: Diagnosis not present

## 2021-03-12 DIAGNOSIS — N189 Chronic kidney disease, unspecified: Secondary | ICD-10-CM | POA: Diagnosis not present

## 2021-03-12 DIAGNOSIS — E119 Type 2 diabetes mellitus without complications: Secondary | ICD-10-CM | POA: Insufficient documentation

## 2021-03-12 DIAGNOSIS — Z79899 Other long term (current) drug therapy: Secondary | ICD-10-CM | POA: Diagnosis not present

## 2021-03-12 HISTORY — PX: CYSTOSCOPY WITH DIRECT VISION INTERNAL URETHROTOMY: SHX6637

## 2021-03-12 LAB — GLUCOSE, CAPILLARY
Glucose-Capillary: 111 mg/dL — ABNORMAL HIGH (ref 70–99)
Glucose-Capillary: 94 mg/dL (ref 70–99)

## 2021-03-12 SURGERY — CYSTOSCOPY, WITH DIRECT VISION INTERNAL URETHROTOMY
Anesthesia: General | Site: Urethra

## 2021-03-12 MED ORDER — CEFAZOLIN SODIUM-DEXTROSE 2-4 GM/100ML-% IV SOLN
2.0000 g | INTRAVENOUS | Status: AC
  Administered 2021-03-12: 2 g via INTRAVENOUS
  Filled 2021-03-12: qty 100

## 2021-03-12 MED ORDER — DEXAMETHASONE SODIUM PHOSPHATE 4 MG/ML IJ SOLN
INTRAMUSCULAR | Status: DC | PRN
  Administered 2021-03-12: 8 mg via INTRAVENOUS

## 2021-03-12 MED ORDER — CHLORHEXIDINE GLUCONATE 0.12 % MT SOLN
15.0000 mL | Freq: Once | OROMUCOSAL | Status: AC
  Administered 2021-03-12: 15 mL via OROMUCOSAL

## 2021-03-12 MED ORDER — PHENYLEPHRINE 40 MCG/ML (10ML) SYRINGE FOR IV PUSH (FOR BLOOD PRESSURE SUPPORT)
PREFILLED_SYRINGE | INTRAVENOUS | Status: AC
  Filled 2021-03-12: qty 10

## 2021-03-12 MED ORDER — LIDOCAINE HCL (CARDIAC) PF 100 MG/5ML IV SOSY
PREFILLED_SYRINGE | INTRAVENOUS | Status: DC | PRN
Start: 2021-03-12 — End: 2021-03-12
  Administered 2021-03-12: 80 mg via INTRAVENOUS

## 2021-03-12 MED ORDER — ORAL CARE MOUTH RINSE: 15.0000 mL | Freq: Once | OROMUCOSAL | Status: AC

## 2021-03-12 MED ORDER — DIATRIZOATE MEGLUMINE 30 % UR SOLN
URETHRAL | Status: AC
  Filled 2021-03-12: qty 100

## 2021-03-12 MED ORDER — EPHEDRINE SULFATE 50 MG/ML IJ SOLN
INTRAMUSCULAR | Status: DC | PRN
Start: 2021-03-12 — End: 2021-03-12
  Administered 2021-03-12: 5 mg via INTRAVENOUS
  Administered 2021-03-12: 10 mg via INTRAVENOUS
  Administered 2021-03-12: 5 mg via INTRAVENOUS
  Administered 2021-03-12: 10 mg via INTRAVENOUS

## 2021-03-12 MED ORDER — FENTANYL CITRATE (PF) 100 MCG/2ML IJ SOLN
INTRAMUSCULAR | Status: DC | PRN
  Administered 2021-03-12 (×2): 25 ug via INTRAVENOUS

## 2021-03-12 MED ORDER — ONDANSETRON HCL 4 MG/2ML IJ SOLN
INTRAMUSCULAR | Status: AC
  Filled 2021-03-12: qty 2

## 2021-03-12 MED ORDER — PROPOFOL 10 MG/ML IV BOLUS
INTRAVENOUS | Status: AC
  Filled 2021-03-12: qty 20

## 2021-03-12 MED ORDER — LIDOCAINE HCL (PF) 2 % IJ SOLN
INTRAMUSCULAR | Status: AC
  Filled 2021-03-12: qty 5

## 2021-03-12 MED ORDER — GLYCOPYRROLATE 0.2 MG/ML IJ SOLN
INTRAMUSCULAR | Status: DC | PRN
  Administered 2021-03-12: .1 mg via INTRAVENOUS

## 2021-03-12 MED ORDER — DEXAMETHASONE SODIUM PHOSPHATE 10 MG/ML IJ SOLN
INTRAMUSCULAR | Status: AC
  Filled 2021-03-12: qty 1

## 2021-03-12 MED ORDER — EPHEDRINE 5 MG/ML INJ
INTRAVENOUS | Status: AC
  Filled 2021-03-12: qty 5

## 2021-03-12 MED ORDER — PROPOFOL 10 MG/ML IV BOLUS
INTRAVENOUS | Status: DC | PRN
  Administered 2021-03-12: 180 mg via INTRAVENOUS

## 2021-03-12 MED ORDER — GLYCOPYRROLATE 0.2 MG/ML IJ SOLN
INTRAMUSCULAR | Status: AC
  Filled 2021-03-12: qty 1

## 2021-03-12 MED ORDER — HYDROCODONE-ACETAMINOPHEN 5-325 MG PO TABS: 1.0000 | ORAL_TABLET | ORAL | 0 refills | Status: DC | PRN

## 2021-03-12 MED ORDER — SODIUM CHLORIDE 0.9 % IR SOLN
Status: DC | PRN
  Administered 2021-03-12: 3000 mL

## 2021-03-12 MED ORDER — CARVEDILOL 25 MG PO TABS
25.0000 mg | ORAL_TABLET | ORAL | Status: AC
  Administered 2021-03-12: 25 mg via ORAL
  Filled 2021-03-12: qty 1

## 2021-03-12 MED ORDER — DIATRIZOATE MEGLUMINE 30 % UR SOLN
URETHRAL | Status: DC | PRN
  Administered 2021-03-12: 100 mL

## 2021-03-12 MED ORDER — PHENYLEPHRINE HCL (PRESSORS) 10 MG/ML IV SOLN
INTRAVENOUS | Status: DC | PRN
  Administered 2021-03-12 (×2): 80 ug via INTRAVENOUS

## 2021-03-12 MED ORDER — LACTATED RINGERS IV SOLN: INTRAVENOUS | Status: DC

## 2021-03-12 MED ORDER — ONDANSETRON HCL 4 MG/2ML IJ SOLN
INTRAMUSCULAR | Status: DC | PRN
  Administered 2021-03-12: 4 mg via INTRAVENOUS

## 2021-03-12 MED ORDER — ACETAMINOPHEN 10 MG/ML IV SOLN: 1000.0000 mg | Freq: Once | INTRAVENOUS | Status: DC | PRN

## 2021-03-12 MED ORDER — FENTANYL CITRATE (PF) 100 MCG/2ML IJ SOLN
INTRAMUSCULAR | Status: AC
  Filled 2021-03-12: qty 2

## 2021-03-12 MED ORDER — FENTANYL CITRATE PF 50 MCG/ML IJ SOSY: 25.0000 ug | PREFILLED_SYRINGE | INTRAMUSCULAR | Status: DC | PRN

## 2021-03-12 SURGICAL SUPPLY — 21 items
BAG URINE DRAIN 2000ML AR STRL (UROLOGICAL SUPPLIES) ×4 IMPLANT
BALLN NEPHROSTOMY (BALLOONS)
BALLN OPTILUME DCB 30X3X75 (BALLOONS) ×2
BALLOON NEPHROSTOMY (BALLOONS) IMPLANT
BALLOON OPTILUME DCB 30X3X75 (BALLOONS) ×1 IMPLANT
CATH FOLEY 2W COUNCIL 20FR 5CC (CATHETERS) IMPLANT
CATH ROBINSON RED A/P 14FR (CATHETERS) ×2 IMPLANT
CATH SILICONE 5CC 18FR (INSTRUMENTS) ×2 IMPLANT
CATH URET 5FR 28IN CONE TIP (BALLOONS)
CATH URET 5FR 70CM CONE TIP (BALLOONS) IMPLANT
CATH URETL OPEN END 6FR 70 (CATHETERS) IMPLANT
CLOTH BEACON ORANGE TIMEOUT ST (SAFETY) ×2 IMPLANT
GLOVE SURG ENC MOIS LTX SZ7.5 (GLOVE) ×2 IMPLANT
GOWN STRL REUS W/TWL XL LVL3 (GOWN DISPOSABLE) ×2 IMPLANT
GUIDEWIRE ANG ZIPWIRE 038X150 (WIRE) IMPLANT
GUIDEWIRE STR DUAL SENSOR (WIRE) ×2 IMPLANT
MANIFOLD NEPTUNE II (INSTRUMENTS) IMPLANT
NS IRRIG 1000ML POUR BTL (IV SOLUTION) IMPLANT
PACK CYSTO (CUSTOM PROCEDURE TRAY) ×2 IMPLANT
PENCIL SMOKE EVACUATOR (MISCELLANEOUS) IMPLANT
WATER STERILE IRR 3000ML UROMA (IV SOLUTION) ×2 IMPLANT

## 2021-03-12 NOTE — H&P (Signed)
CC/HPI: CC: Gross hematuria  HPI:  09/18/2020  Patient went to the emergency department on 09/13/2020 with hematuria with clots. He was found to have a hemoglobin of 12.5, creatinine 2.21. No imaging. Foley catheter was placed and his bladder was irrigated. He no longer has a Foley catheter. He had an episode of gross hematuria 4 years ago and underwent cystoscopy that was negative reportedly. He has a history of prostate cancer Gleason 5 + 4 with some pelvic adenopathy. He had combination ADT with radiation. He does not recall his last PSA. Previously followed in [facility]. He denies any voiding complaints other than the gross hematuria. No dysuria. No pain.    10/18/2020  Patient underwent a CT IVP. This revealed no evidence of genitourinary malignancy. No stones. He continues to have intermittent gross hematuria sometimes with clots. He is voiding well and emptying his bladder. He presents for cystoscopy.    11/17/2020  Since last visit, the patient presented to the emergency department with gross hematuria with clot retention. He was taken the operating room for cystoscopy with clot evacuation where he was found to have a small superficial tumor. 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 Tumor fragments measured 0.1 x 0.1 cm. He has no further gross hematuria. He received 1 more unit of PRBCs on Tuesday for symptomatic acute blood-loss anemia. He also complains about some weak stream.    01/04/2021  Patient comes in with primary complaints of penile swelling and difficulty voiding. PVR is 390. He is voiding small amounts. Does not feel like he is in retention. He is not uncomfortable. Wants to avoid a catheter if possible. Occasional dysuria. He is circumcised. Residual Foreskin does not typically cover his glans.    01/12/2021: Catheter placement suggested at last office visit but patient declined. Treated empirically for underlying infectious process with cephalexin.  Urine culture resulted positive for strep viridans. He is on tamsulosin. Now back today for f/u visit with PVR.    Today better, penile edema has resolved. He states he feels like stream is improving as well. Still weak but now more consistent and with less hesitancy/straining. He states he feels like he is emptying more appropriately at this point, he does continue with an elevated residual decreased to 320 mL on today's exam, however PVR was done about an hour after he presented to the office so likely not reflective of a true residual. He denies any interval dysuria or gross hematuria. Nocturia stable 2-3 times nightly. He continues to struggle with lower extremity edema but wears compression stockings.    02/13/2021  Patient presents today for surveillance cystoscopy. He has occasional splitting of his urinary stream. He has had issues with incomplete bladder emptying. He does not feel like he is having significant difficulty voiding.      CC: AUA Questions Scoring.  HPI: [name] is a 87 year-old male established patient who is here AUA Questions.         AUA Symptom Score: Less than 20% of the time he has the sensation of not emptying his bladder completely when finished urinating. Less than 20% of the time he has to urinate again fewer than two hours after he has finished urinating. Less than 50% of the time he has to start and stop again several times when he urinates. Less than 50% of the time he finds it difficult to postpone urination. Less than 20% of the time he has a weak urinary stream. 50% of the time he  has to push or strain to begin urination. He has to get up to urinate 1 time from the time he goes to bed until the time he gets up in the morning.    Calculated AUA Symptom Score: 11     ALLERGIES: No Known Drug Allergies     MEDICATIONS: Tamsulosin Hcl 0.4 mg capsule TAKE 1 CAPSULE EVERY DAY  Tamsulosin Hcl 0.4 mg capsule 1 capsule PO Daily  Amlodipine Besylate 10 mg  tablet Oral  Atorvastatin Calcium 10 mg tablet  Carvedilol 25 mg tablet  Lisinopril 40 mg tablet Oral  Novolin 70-30      GU PSH: Cystoscopy - 10/18/2020        PSH Notes: Cholecystectomy, Enteroscopic Polypectomy, Knee Surgery    NON-GU PSH: Cardiac Stent Placement Cholecystectomy (open) - 2008 Extensive Prostate Surgery Small Bowel Endoscopy - 2008       GU PMH: Acute Cystitis/UTI - 01/12/2021, - 01/04/2021 Incomplete bladder emptying - 01/12/2021, - 01/04/2021 Bladder Cancer Lateral - 01/04/2021, - 11/17/2020 Gross hematuria - 11/17/2020, - 10/18/2020, - 10/03/2020, - 09/18/2020 Prostate Cancer - 11/17/2020, - 10/18/2020 (Stable), - 09/18/2020, Prostate cancer, - 2014, Prostate cancer, - 2014 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, Erectile dysfunction due to arterial insufficiency - 2014 Hydrocele, Unspec, Hydrocele, left - 2014      PMH Notes:  1898-04-08 00:00:00 - Note: Normal Routine History And Physical Senior Citizen (65-80)    NON-GU PMH: Diabetes Type 2, Diabetes mellitus - 2014 Hypertension, Hypertension - 2014 Personal history of other diseases of the circulatory system, History of hypertension - 2014 Personal history of other endocrine, nutritional and metabolic disease, History of diabetes mellitus - 2014, History of hypercholesterolemia, - 2014     FAMILY HISTORY: 2 daughters - Other 1 son - Other Acute Myocardial Infarction - Runs In Family Adenocarcinoma Of The Pancreas - Mother Death In The Family Father - Runs In Family Death In The Family Mother - Runs In Family Family Health Status Number - Runs In Family Heart Disease - Runs In Family leukemia - Runs In Family    SOCIAL HISTORY: Marital Status: Married Preferred Language: English; Race: Black or African American Current Smoking Status: Patient does not smoke anymore. Has not smoked since 09/07/1990.    Tobacco Use Assessment Completed: Used Tobacco in last 30  days? Drinks 3 caffeinated drinks per day.     Notes: Tobacco Use, Caffeine Use, Alcohol Use, Marital History - Single    REVIEW OF SYSTEMS:    GU Review Male:   Patient denies frequent urination, hard to postpone urination, burning/ pain with urination, get up at night to urinate, leakage of urine, stream starts and stops, trouble starting your stream, have to strain to urinate , erection problems, and penile pain.  Gastrointestinal (Upper):   Patient denies nausea, vomiting, and indigestion/ heartburn.  Gastrointestinal (Lower):   Patient denies diarrhea and constipation.  Constitutional:   Patient denies fever, night sweats, weight loss, and fatigue.  Skin:   Patient denies skin rash/ lesion and itching.  Eyes:   Patient denies blurred vision and double vision.  Ears/ Nose/ Throat:   Patient denies sore throat and sinus problems.  Hematologic/Lymphatic:   Patient denies swollen glands and easy bruising.  Cardiovascular:   Patient denies leg swelling and chest pains.  Respiratory:   Patient denies cough and shortness of breath.  Endocrine:   Patient denies excessive thirst.  Musculoskeletal:   Patient denies back pain and joint  pain.  Neurological:   Patient denies headaches and dizziness.  Psychologic:   Patient denies depression and anxiety.    VITAL SIGNS: None    GU PHYSICAL EXAMINATION:    Penis: Circumcised, mild penile edema    MULTI-SYSTEM PHYSICAL EXAMINATION:    NAD     Complexity of Data:  Source Of History:  Patient  Records Review:   AUA Symptom Score, Previous Patient Records  Urine Test Review:   Urinalysis   09/18/20 12/21/08 08/19/08 04/18/08 12/18/06  PSA  Total PSA 0.19 ng/mL <0.04  <0.04  0.29  5.03   Free PSA     1.73   % Free PSA     34.4      08/19/08  Hormones  Testosterone, Total <20.0       PROCEDURES:         Flexible Cystoscopy - 52000  Risks, benefits, and some of the potential complications of the procedure were discussed at length with  the patient including infection, bleeding, voiding discomfort, urinary retention, fever, chills, sepsis, and others. All questions were answered. Informed consent was obtained. Antibiotic prophylaxis was given. Sterile technique and intraurethral analgesia were used.  Meatus:  Normal size. Normal location. Normal condition.  Urethra:  About a 5 French bulbar urethral stricture that looks dense      The lower urinary tract was carefully examined. The procedure was well-tolerated and without complications. Antibiotic instructions were given. Instructions were given to call the office immediately for bloody urine, difficulty urinating, urinary retention, painful or frequent urination, fever, chills, nausea, vomiting or other illness. The patient stated that he understood these instructions and would comply with them.           Urinalysis w/Scope Dipstick Dipstick Cont'd Micro  Color: Yellow Bilirubin: Neg mg/dL WBC/hpf: >60/hpf  Appearance: Cloudy Ketones: Neg mg/dL RBC/hpf: 3 - 10/hpf  Specific Gravity: 1.025 Blood: 1+ ery/uL Bacteria: Many (>50/hpf)  pH: 5.5 Protein: 2+ mg/dL Cystals: NS (Not Seen)  Glucose: Neg mg/dL Urobilinogen: 0.2 mg/dL Casts: NS (Not Seen)    Nitrites: Neg Trichomonas: Not Present    Leukocyte Esterase: 3+ leu/uL Mucous: Not Present      Epithelial Cells: 0 - 5/hpf      Yeast: NS (Not Seen)      Sperm: Not Present      ASSESSMENT:      ICD-10 Details  1 GU:   Bladder Cancer Lateral - C67.2 Chronic, Stable  2   Bulbar urethral stricture - N35.011 Undiagnosed New Problem    PLAN:            Orders Labs Urine Culture            Document Letter(s):  Created for Patient: Clinical Summary           Notes:   Plan for cystoscopy with direct visual internal urethrotomy with optilume balloon dilation. I will also inspect the bladder and if there is a tumor plan for TURBT.    CC: Dr. Carlota Raspberry      Signed by Link Snuffer, III, M.D. on 02/13/21 at 10:34 AM (EST

## 2021-03-12 NOTE — Anesthesia Procedure Notes (Signed)
Procedure Name: LMA Insertion Date/Time: 03/12/2021 12:30 PM Performed by: Deliah Boston, CRNA Pre-anesthesia Checklist: Patient identified, Emergency Drugs available, Suction available and Patient being monitored Patient Re-evaluated:Patient Re-evaluated prior to induction Oxygen Delivery Method: Circle system utilized Preoxygenation: Pre-oxygenation with 100% oxygen Induction Type: IV induction LMA: LMA with gastric port inserted LMA Size: 5.0 Number of attempts: 1 Placement Confirmation: positive ETCO2 and breath sounds checked- equal and bilateral Tube secured with: Tape Dental Injury: Teeth and Oropharynx as per pre-operative assessment

## 2021-03-12 NOTE — Anesthesia Postprocedure Evaluation (Signed)
Anesthesia Post Note  Patient: [name]  Procedure(s) Performed: cystoscoopy withdirect vision internal urethrotomy optilum urethral dilation      (Urethra)     Patient location during evaluation: PACU Anesthesia Type: General Level of consciousness: awake and alert Pain management: pain level controlled Vital Signs Assessment: post-procedure vital signs reviewed and stable Respiratory status: spontaneous breathing, nonlabored ventilation, respiratory function stable and patient connected to nasal cannula oxygen Cardiovascular status: blood pressure returned to baseline and stable Postop Assessment: no apparent nausea or vomiting Anesthetic complications: no   No notable events documented.  Last Vitals:  Vitals:   03/12/21 1330 03/12/21 1345  BP: (!) 164/82 (!) 170/91  Pulse: 76 70  Resp: 13 15  Temp:  36.4 C  SpO2: 92% 98%    Last Pain:  Vitals:   03/12/21 1345  TempSrc:   PainSc: 0-No pain                 Belenda Cruise P [name]

## 2021-03-12 NOTE — Op Note (Signed)
Operative Note  Preoperative diagnosis:  1.  Bulbar urethral stricture 2.  History of bladder cancer  Postoperative diagnosis: 1.  Same  Procedure(s): 1.  Cystoscopy with direct visual internal ureterotomy 2.  Optilume balloon urethral dilation  Surgeon: Link Snuffer, MD  Assistants: None  Anesthesia: General  Complications: None immediate  EBL: Minimal  Specimens: 1.  None  Drains/Catheters: 1.  18 French silicone catheter  Intraoperative findings: 1.  Anterior urethra with an approximately 5 French short bulbar urethral stricture.  Bladder mucosa with some erythema consistent with scope trauma but no obvious tumors.  Indication: 87 year old male with a history of bladder cancer as well as a history of radiation for prostate cancer.  He presented for surveillance cystoscopy and was noted to have a bulbar urethral stricture and presents for the previously mentioned operation.  Unable to  Description of procedure:  The patient was identified and consent was obtained.  The patient was taken to the operating room and placed in the supine position.  The patient was placed under general anesthesia.  Perioperative antibiotics were administered.  The patient was placed in dorsal lithotomy.  Patient was prepped and draped in a standard sterile fashion and a timeout was performed.  The urethral meatus was dilated with sounds from 20 [place] up to 26 [place].  The DVIU scope was then advanced into the urethra up to the stricture.  Fluoroscopy confirmed location of the stricture.  I then passed a wire through the scope and through the stricture and into the bladder.  An incision was made at the 12 o'clock position and the urethra was wide open.  The scope was advanced into the bladder.  No other strictures were seen.  I withdrew the scope and exchanged this for a cystoscope with the 30 degree lens.  I advanced that into the bladder and performed a complete cystoscopy.  No obvious tumors were  seen.  I removed the scope.  A 5 cm 30 French optilume balloon was advanced over the wire under fluoroscopic guidance and transversed the area of stricture.  This was filled to a pressure of 10 and fluoroscopy again confirmed proper placement of the balloon.  This remained inflated for approximately 10 minutes.  The balloon was deflated.  The wire and balloon were withdrawn and an 68 French silicone catheter was placed.  This concluded the operation.  Patient tolerated the procedure well and was stable postoperative.  Plan: Follow-up in 1 week for catheter removal.  He will need to follow-up in 3 months for surveillance cystoscopy.

## 2021-03-12 NOTE — Op Note (Deleted)
CC/HPI: CC: Gross hematuria  HPI:  09/18/2020  Patient went to the emergency department on 09/13/2020 with hematuria with clots. He was found to have a hemoglobin of 12.5, creatinine 2.21. No imaging. Foley catheter was placed and his bladder was irrigated. He no longer has a Foley catheter. He had an episode of gross hematuria 4 years ago and underwent cystoscopy that was negative reportedly. He has a history of prostate cancer Gleason 5 + 4 with some pelvic adenopathy. He had combination ADT with radiation. He does not recall his last PSA. Previously followed in [facility]. He denies any voiding complaints other than the gross hematuria. No dysuria. No pain.   10/18/2020  Patient underwent a CT IVP. This revealed no evidence of genitourinary malignancy. No stones. He continues to have intermittent gross hematuria sometimes with clots. He is voiding well and emptying his bladder. He presents for cystoscopy.   11/17/2020  Since last visit, the patient presented to the emergency department with gross hematuria with clot retention. He was taken the operating room for cystoscopy with clot evacuation where he was found to have a small superficial tumor. 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 Tumor fragments measured 0.1 x 0.1 cm. He has no further gross hematuria. He received 1 more unit of PRBCs on Tuesday for symptomatic acute blood-loss anemia. He also complains about some weak stream.   01/04/2021  Patient comes in with primary complaints of penile swelling and difficulty voiding. PVR is 390. He is voiding small amounts. Does not feel like he is in retention. He is not uncomfortable. Wants to avoid a catheter if possible. Occasional dysuria. He is circumcised. Residual Foreskin does not typically cover his glans.   01/12/2021: Catheter placement suggested at last office visit but patient declined. Treated empirically for underlying infectious process with cephalexin. Urine  culture resulted positive for strep viridans. He is on tamsulosin. Now back today for f/u visit with PVR.   Today better, penile edema has resolved. He states he feels like stream is improving as well. Still weak but now more consistent and with less hesitancy/straining. He states he feels like he is emptying more appropriately at this point, he does continue with an elevated residual decreased to 320 mL on today's exam, however PVR was done about an hour after he presented to the office so likely not reflective of a true residual. He denies any interval dysuria or gross hematuria. Nocturia stable 2-3 times nightly. He continues to struggle with lower extremity edema but wears compression stockings.   02/13/2021  Patient presents today for surveillance cystoscopy. He has occasional splitting of his urinary stream. He has had issues with incomplete bladder emptying. He does not feel like he is having significant difficulty voiding.     CC: AUA Questions Scoring.  HPI: [name] is a 87 year-old male established patient who is here AUA Questions.      AUA Symptom Score: Less than 20% of the time he has the sensation of not emptying his bladder completely when finished urinating. Less than 20% of the time he has to urinate again fewer than two hours after he has finished urinating. Less than 50% of the time he has to start and stop again several times when he urinates. Less than 50% of the time he finds it difficult to postpone urination. Less than 20% of the time he has a weak urinary stream. 50% of the time he has to push or strain to begin urination. He has  to get up to urinate 1 time from the time he goes to bed until the time he gets up in the morning.   Calculated AUA Symptom Score: 11    ALLERGIES: No Known Drug Allergies    MEDICATIONS: Tamsulosin Hcl 0.4 mg capsule TAKE 1 CAPSULE EVERY DAY  Tamsulosin Hcl 0.4 mg capsule 1 capsule PO Daily  Amlodipine Besylate 10 mg tablet Oral   Atorvastatin Calcium 10 mg tablet  Carvedilol 25 mg tablet  Lisinopril 40 mg tablet Oral  Novolin 70-30     GU PSH: Cystoscopy - 10/18/2020       PSH Notes: Cholecystectomy, Enteroscopic Polypectomy, Knee Surgery   NON-GU PSH: Cardiac Stent Placement Cholecystectomy (open) - 2008 Extensive Prostate Surgery Small Bowel Endoscopy - 2008     GU PMH: Acute Cystitis/UTI - 01/12/2021, - 01/04/2021 Incomplete bladder emptying - 01/12/2021, - 01/04/2021 Bladder Cancer Lateral - 01/04/2021, - 11/17/2020 Gross hematuria - 11/17/2020, - 10/18/2020, - 10/03/2020, - 09/18/2020 Prostate Cancer - 11/17/2020, - 10/18/2020 (Stable), - 09/18/2020, Prostate cancer, - 2014, Prostate cancer, - 2014 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, Erectile dysfunction due to arterial insufficiency - 2014 Hydrocele, Unspec, Hydrocele, left - 2014      PMH Notes:  1898-04-08 00:00:00 - Note: Normal Routine History And Physical Senior Citizen (65-80)   NON-GU PMH: Diabetes Type 2, Diabetes mellitus - 2014 Hypertension, Hypertension - 2014 Personal history of other diseases of the circulatory system, History of hypertension - 2014 Personal history of other endocrine, nutritional and metabolic disease, History of diabetes mellitus - 2014, History of hypercholesterolemia, - 2014    FAMILY HISTORY: 2 daughters - Other 1 son - Other Acute Myocardial Infarction - Runs In Family Adenocarcinoma Of The Pancreas - Mother Death In The Family Father - Runs In Family Death In The Family Mother - Runs In Family Family Health Status Number - Runs In Family Heart Disease - Runs In Family leukemia - Runs In Family   SOCIAL HISTORY: Marital Status: Married Preferred Language: English; Race: Black or African American Current Smoking Status: Patient does not smoke anymore. Has not smoked since 09/07/1990.   Tobacco Use Assessment Completed: Used Tobacco in last 30 days? Drinks 3  caffeinated drinks per day.     Notes: Tobacco Use, Caffeine Use, Alcohol Use, Marital History - Single   REVIEW OF SYSTEMS:    GU Review Male:   Patient denies frequent urination, hard to postpone urination, burning/ pain with urination, get up at night to urinate, leakage of urine, stream starts and stops, trouble starting your stream, have to strain to urinate , erection problems, and penile pain.  Gastrointestinal (Upper):   Patient denies nausea, vomiting, and indigestion/ heartburn.  Gastrointestinal (Lower):   Patient denies diarrhea and constipation.  Constitutional:   Patient denies fever, night sweats, weight loss, and fatigue.  Skin:   Patient denies skin rash/ lesion and itching.  Eyes:   Patient denies blurred vision and double vision.  Ears/ Nose/ Throat:   Patient denies sore throat and sinus problems.  Hematologic/Lymphatic:   Patient denies swollen glands and easy bruising.  Cardiovascular:   Patient denies leg swelling and chest pains.  Respiratory:   Patient denies cough and shortness of breath.  Endocrine:   Patient denies excessive thirst.  Musculoskeletal:   Patient denies back pain and joint pain.  Neurological:   Patient denies headaches and dizziness.  Psychologic:   Patient denies depression and anxiety.   VITAL SIGNS:  None   GU PHYSICAL EXAMINATION:    Penis: Circumcised, mild penile edema   MULTI-SYSTEM PHYSICAL EXAMINATION:    NAD    Complexity of Data:  Source Of History:  Patient  Records Review:   AUA Symptom Score, Previous Patient Records  Urine Test Review:   Urinalysis   09/18/20 12/21/08 08/19/08 04/18/08 12/18/06  PSA  Total PSA 0.19 ng/mL <0.04  <0.04  0.29  5.03   Free PSA     1.73   % Free PSA     34.4     08/19/08  Hormones  Testosterone, Total <20.0     PROCEDURES:         Flexible Cystoscopy - 52000  Risks, benefits, and some of the potential complications of the procedure were discussed at length with the patient including  infection, bleeding, voiding discomfort, urinary retention, fever, chills, sepsis, and others. All questions were answered. Informed consent was obtained. Antibiotic prophylaxis was given. Sterile technique and intraurethral analgesia were used.  Meatus:  Normal size. Normal location. Normal condition.  Urethra:  About a 5 French bulbar urethral stricture that looks dense      The lower urinary tract was carefully examined. The procedure was well-tolerated and without complications. Antibiotic instructions were given. Instructions were given to call the office immediately for bloody urine, difficulty urinating, urinary retention, painful or frequent urination, fever, chills, nausea, vomiting or other illness. The patient stated that he understood these instructions and would comply with them.         Urinalysis w/Scope Dipstick Dipstick Cont'd Micro  Color: Yellow Bilirubin: Neg mg/dL WBC/hpf: >60/hpf  Appearance: Cloudy Ketones: Neg mg/dL RBC/hpf: 3 - 10/hpf  Specific Gravity: 1.025 Blood: 1+ ery/uL Bacteria: Many (>50/hpf)  pH: 5.5 Protein: 2+ mg/dL Cystals: NS (Not Seen)  Glucose: Neg mg/dL Urobilinogen: 0.2 mg/dL Casts: NS (Not Seen)    Nitrites: Neg Trichomonas: Not Present    Leukocyte Esterase: 3+ leu/uL Mucous: Not Present      Epithelial Cells: 0 - 5/hpf      Yeast: NS (Not Seen)      Sperm: Not Present    ASSESSMENT:      ICD-10 Details  1 GU:   Bladder Cancer Lateral - C67.2 Chronic, Stable  2   Bulbar urethral stricture - N35.011 Undiagnosed New Problem   PLAN:           Orders Labs Urine Culture          Document Letter(s):  Created for Patient: Clinical Summary         Notes:   Plan for cystoscopy with direct visual internal urethrotomy with optilume balloon dilation. I will also inspect the bladder and if there is a tumor plan for TURBT.   CC: Dr. Carlota Raspberry     Signed by Link Snuffer, III, M.D. on 02/13/21 at 10:34 AM (EST

## 2021-03-12 NOTE — Transfer of Care (Signed)
Immediate Anesthesia Transfer of Care Note  Patient: [name]  Procedure(s) Performed: cystoscoopy withdirect vision internal urethrotomy optilum urethral dilation      (Urethra)  Patient Location: PACU  Anesthesia Type:General  Level of Consciousness: awake, alert  and oriented  Airway & Oxygen Therapy: Patient Spontanous Breathing and Patient connected to face mask oxygen  Post-op Assessment: Report given to RN and Post -op Vital signs reviewed and stable  Post vital signs: Reviewed and stable  Last Vitals:  Vitals Value Taken Time  BP    Temp    Pulse 79 03/12/21 1316  Resp 16 03/12/21 1316  SpO2 100 % 03/12/21 1316  Vitals shown include unvalidated device data.  Last Pain:  Vitals:   03/12/21 1024  TempSrc:   PainSc: 0-No pain         Complications: No notable events documented.

## 2021-03-13 ENCOUNTER — Encounter (HOSPITAL_COMMUNITY): Payer: Self-pay | Admitting: Urology

## 2021-04-30 ENCOUNTER — Other Ambulatory Visit: Payer: Self-pay | Admitting: Cardiovascular Disease

## 2021-04-30 DIAGNOSIS — E785 Hyperlipidemia, unspecified: Secondary | ICD-10-CM

## 2021-05-17 ENCOUNTER — Other Ambulatory Visit: Payer: Self-pay | Admitting: Cardiovascular Disease

## 2021-06-25 ENCOUNTER — Other Ambulatory Visit: Payer: Self-pay | Admitting: Cardiovascular Disease

## 2021-06-25 DIAGNOSIS — I10 Essential (primary) hypertension: Secondary | ICD-10-CM

## 2021-08-08 ENCOUNTER — Other Ambulatory Visit: Payer: Self-pay | Admitting: Urology

## 2021-08-23 NOTE — Progress Notes (Addendum)
COVID Vaccine Completed: Yes   Date of COVID positive in last 90 days:  No  PCP - Rolan Lipa, MD Cardiologist - Quay Burow, MD Electrophysiologist Thompson Grayer, MD  Chest x-ray - 12-20-20 [facility] EKG - 11-15-20 Epic Stress Test - greater than 2 years Epic ECHO - 09-12-20 Epic (scheduled for 09-12-21) Cardiac Cath - greater than 2 years Pacemaker/ICD device last checked: Spinal Cord Stimulator:  Bowel Prep - [name]  Sleep Study - [name] CPAP -   Fasting Blood Sugar - 89 to 135 Checks Blood Sugar - Freestyle Libre, checks a few times a day  Blood Thinner Instructions:[name] Aspirin Instructions: Last Dose:  Activity level:   Can go up a flight of stairs and perform activities of daily living without stopping and without symptoms of chest pain.  Patient does have exertional shortness of breath.    Anesthesia review: CAD with hx of stent, CHF, aortic valve disease, aortic stenosis, HTN, DM, CKD  BUN 44, creatinine 3.75  on PAT labs  Patient denies shortness of breath, fever, cough and chest pain at PAT appointment  Patient verbalized understanding of instructions that were given to them at the PAT appointment. Patient was also instructed that they will need to review over the PAT instructions again at home before surgery.

## 2021-08-23 NOTE — Patient Instructions (Addendum)
DUE TO COVID-19 ONLY TWO VISITORS  (aged 86 and older)  IS ALLOWED TO COME WITH YOU AND STAY IN THE WAITING ROOM ONLY DURING PRE OP AND PROCEDURE.   **NO VISITORS ARE ALLOWED IN THE SHORT STAY AREA OR RECOVERY ROOM!!**  IF YOU WILL BE ADMITTED INTO THE HOSPITAL YOU ARE ALLOWED ONLY FOUR SUPPORT PEOPLE DURING VISITATION HOURS ONLY (7 AM -8PM)   The support person(s) must pass our screening, gel in and out Visitors GUEST BADGE MUST BE WORN VISIBLY  One adult visitor may remain with you overnight and MUST be in the room by 8 P.M.   You are not required to [employer] often Do NOT share personal items Notify your provider if you are in close contact with someone who has COVID or you develop fever 100.4 or greater, new onset of sneezing, cough, sore throat, shortness of breath or body aches.        Your procedure is scheduled on:  09-10-21   Report to [facility] Main Entrance    Report to admitting at 8:00 AM   Call this number if you have problems the morning of surgery [phone]   Do not eat food :After Midnight.   After Midnight you may have the following liquids until 7:00 AM DAY OF SURGERY  Water Black Coffee (sugar ok, NO MILK/CREAM OR CREAMERS)  Tea (sugar ok, NO MILK/CREAM OR CREAMERS) regular and decaf                             Plain Jell-O (NO RED)                                           Fruit ices (not with fruit pulp, NO RED)                                     Popsicles (NO RED)                                                                  Juice: apple, WHITE grape, WHITE cranberry Sports drinks like Gatorade (NO RED) Clear broth(vegetable,chicken,beef)                       If you have questions, please contact your surgeon's office.   FOLLOW ANY ADDITIONAL PRE OP INSTRUCTIONS YOU RECEIVED FROM YOUR SURGEON'S OFFICE!!!     Oral Hygiene is also important to reduce your risk of infection.                                    Remember -  BRUSH YOUR TEETH THE MORNING OF SURGERY WITH YOUR REGULAR TOOTHPASTE   Do NOT smoke after Midnight   Take these medicines the morning of surgery with A SIP OF WATER:  Amlodipine, Atorvastatin, Carvediolol, Tamsulosin,  Tylenol, Hydrocodone if needed  How to Manage Your Diabetes Before and After Surgery  Why is it  important to control my blood sugar before and after surgery? Improving blood sugar levels before and after surgery helps healing and can limit problems. A way of improving blood sugar control is eating a healthy diet by:  Eating less sugar and carbohydrates  Increasing activity/exercise  Talking with your doctor about reaching your blood sugar goals High blood sugars (greater than 180 mg/dL) can raise your risk of infections and slow your recovery, so you will need to focus on controlling your diabetes during the weeks before surgery. Make sure that the doctor who takes care of your diabetes knows about your planned surgery including the date and location.  How do I manage my blood sugar before surgery? Check your blood sugar at least 4 times a day, starting 2 days before surgery, to make sure that the level is not too high or low. Check your blood sugar the morning of your surgery when you wake up and every 2 hours until you get to the Short Stay unit. If your blood sugar is less than 70 mg/dL, you will need to treat for low blood sugar: Do not take insulin. Treat a low blood sugar (less than 70 mg/dL) with  cup of clear juice (cranberry or apple), 4 glucose tablets, OR glucose gel. Recheck blood sugar in 15 minutes after treatment (to make sure it is greater than 70 mg/dL). If your blood sugar is not greater than 70 mg/dL on recheck, call [phone] for further instructions. Report your blood sugar to the short stay nurse when you get to Short Stay.  If you are admitted to the hospital after surgery: Your blood sugar will be checked by the staff and you will probably be  given insulin after surgery (instead of oral diabetes medicines) to make sure you have good blood sugar levels. The goal for blood sugar control after surgery is 80-180 mg/dL.   WHAT DO I DO ABOUT MY DIABETES MEDICATION?  Do not take oral diabetes medicines (pills) the morning of surgery.  THE NIGHT BEFORE SURGERY:  Novolin insulin take normal morning dose, take 10  units in the evening .    THE MORNING OF SURGERY: Do not take   Reviewed and Endorsed by [facility] Patient Education Committee, August 2015                               You may not have any metal on your body including jewelry, and body piercing             Do not wear lotions, powders, cologne, or deodorant              Men may shave face and neck.   Do not bring valuables to the hospital. [place].   Contacts, dentures or bridgework may not be worn into surgery.  Patients discharged on the day of surgery will not be allowed to drive home.  Someone NEEDS to stay with you for the first 24 hours after anesthesia.  Special Instructions: Bring a copy of your healthcare power of attorney and living will documents the day of surgery if you haven't scanned them before.  Please read over the following fact sheets you were given: IF YOU HAVE QUESTIONS ABOUT YOUR PRE-OP INSTRUCTIONS PLEASE CALL [place] - Preparing for Surgery Before surgery, you can play an important role.  Because skin is not sterile, your  skin needs to be as free of germs as possible.  You can reduce the number of germs on your skin by washing with CHG (chlorahexidine gluconate) soap before surgery.  CHG is an antiseptic cleaner which kills germs and bonds with the skin to continue killing germs even after washing. Please DO NOT use if you have an allergy to CHG or antibacterial soaps.  If your skin becomes reddened/irritated stop using the CHG and inform your nurse when you arrive at Short  Stay. Do not shave (including legs and underarms) for at least 48 hours prior to the first CHG shower.  You may shave your face/neck.  Please follow these instructions carefully:  1.  Shower with CHG Soap the night before surgery and the  morning of surgery.  2.  If you choose to wash your hair, wash your hair first as usual with your normal  shampoo.  3.  After you shampoo, rinse your hair and body thoroughly to remove the shampoo.                             4.  Use CHG as you would any other liquid soap.  You can apply chg directly to the skin and wash.  Gently with a scrungie or clean washcloth.  5.  Apply the CHG Soap to your body ONLY FROM THE NECK DOWN.   Do   not use on face/ open                           Wound or open sores. Avoid contact with eyes, ears mouth and   genitals (private parts).                       Wash face,  Genitals (private parts) with your normal soap.             6.  Wash thoroughly, paying special attention to the area where your    surgery  will be performed.  7.  Thoroughly rinse your body with warm water from the neck down.  8.  DO NOT shower/wash with your normal soap after using and rinsing off the CHG Soap.                9.  Pat yourself dry with a clean towel.            10.  Wear clean pajamas.            11.  Place clean sheets on your bed the night of your first shower and do not  sleep with pets. Day of Surgery : Do not apply any lotions/deodorants the morning of surgery.  Please wear clean clothes to the hospital/surgery center.  FAILURE TO FOLLOW THESE INSTRUCTIONS MAY RESULT IN THE CANCELLATION OF YOUR SURGERY  PATIENT SIGNATURE_________________________________  NURSE SIGNATURE__________________________________  ________________________________________________________________________

## 2021-08-28 ENCOUNTER — Other Ambulatory Visit: Payer: Self-pay

## 2021-08-28 ENCOUNTER — Encounter (HOSPITAL_COMMUNITY): Payer: Self-pay

## 2021-08-28 ENCOUNTER — Encounter (HOSPITAL_COMMUNITY)
Admission: RE | Admit: 2021-08-28 | Discharge: 2021-08-28 | Disposition: A | Discharge status: Home / Self Care | Payer: Medicare HMO | Source: Ambulatory Visit | Attending: Urology | Admitting: Urology

## 2021-08-28 VITALS — BP 158/72 | HR 88 | Temp 99.6°F | Resp 20 | Ht 72.0 in | Wt 229.6 lb

## 2021-08-28 DIAGNOSIS — Z01812 Encounter for preprocedural laboratory examination: Secondary | ICD-10-CM | POA: Diagnosis present

## 2021-08-28 DIAGNOSIS — Z794 Long term (current) use of insulin: Secondary | ICD-10-CM | POA: Diagnosis not present

## 2021-08-28 DIAGNOSIS — E119 Type 2 diabetes mellitus without complications: Secondary | ICD-10-CM | POA: Diagnosis not present

## 2021-08-28 DIAGNOSIS — D649 Anemia, unspecified: Secondary | ICD-10-CM | POA: Insufficient documentation

## 2021-08-28 DIAGNOSIS — I2510 Atherosclerotic heart disease of native coronary artery without angina pectoris: Secondary | ICD-10-CM | POA: Insufficient documentation

## 2021-08-28 HISTORY — DX: Dyspnea, unspecified: R06.00

## 2021-08-28 LAB — BASIC METABOLIC PANEL
Anion gap: 6 (ref 5–15)
BUN: 44 mg/dL — ABNORMAL HIGH (ref 8–23)
CO2: 19 mmol/L — ABNORMAL LOW (ref 22–32)
Calcium: 8.7 mg/dL — ABNORMAL LOW (ref 8.9–10.3)
Chloride: 112 mmol/L — ABNORMAL HIGH (ref 98–111)
Creatinine, Ser: 3.75 mg/dL — ABNORMAL HIGH (ref 0.61–1.24)
GFR, Estimated: 15 mL/min — ABNORMAL LOW (ref >60)
Glucose, Bld: 78 mg/dL (ref 70–99)
Potassium: 4.6 mmol/L (ref 3.5–5.1)
Sodium: 137 mmol/L (ref 135–145)

## 2021-08-28 LAB — CBC
HCT: 38.1 % — ABNORMAL LOW (ref 39.0–52.0)
Hemoglobin: 12.4 g/dL — ABNORMAL LOW (ref 13.0–17.0)
MCH: 28 pg (ref 26.0–34.0)
MCHC: 32.5 g/dL (ref 30.0–36.0)
MCV: 86 fL (ref 80.0–100.0)
Platelets: 241 10*3/uL (ref 150–400)
RBC: 4.43 MIL/uL (ref 4.22–5.81)
RDW: 16.1 % — ABNORMAL HIGH (ref 11.5–15.5)
WBC: 9 10*3/uL (ref 4.0–10.5)
nRBC: 0 % (ref 0.0–0.2)

## 2021-08-28 LAB — HEMOGLOBIN A1C
Hgb A1c MFr Bld: 7.2 % — ABNORMAL HIGH (ref 4.8–5.6)
Mean Plasma Glucose: 159.94 mg/dL

## 2021-08-28 LAB — GLUCOSE, CAPILLARY: Glucose-Capillary: 88 mg/dL (ref 70–99)

## 2021-09-05 DIAGNOSIS — Z8616 Personal history of COVID-19: Secondary | ICD-10-CM

## 2021-09-05 HISTORY — DX: Personal history of COVID-19: Z86.16

## 2021-09-06 NOTE — Progress Notes (Signed)
Spoke to patient's wife and she stated that patient tested positive for Covid on 09-05-21 and would need to reschedule surgery for 09-10-21. She was given the phone number to Dr. Purvis Sheffield office to reschedule surgery.

## 2021-09-12 ENCOUNTER — Ambulatory Visit (HOSPITAL_COMMUNITY): Payer: Medicare HMO | Attending: Cardiovascular Disease

## 2021-09-12 ENCOUNTER — Encounter (HOSPITAL_COMMUNITY): Payer: Self-pay

## 2021-09-12 ENCOUNTER — Encounter (HOSPITAL_COMMUNITY): Payer: Self-pay | Admitting: Urology

## 2021-09-12 DIAGNOSIS — I10 Essential (primary) hypertension: Secondary | ICD-10-CM | POA: Diagnosis not present

## 2021-09-12 DIAGNOSIS — R0609 Other forms of dyspnea: Secondary | ICD-10-CM | POA: Diagnosis not present

## 2021-09-12 DIAGNOSIS — I4729 Other ventricular tachycardia: Secondary | ICD-10-CM | POA: Diagnosis not present

## 2021-09-12 DIAGNOSIS — Z9861 Coronary angioplasty status: Secondary | ICD-10-CM

## 2021-09-12 DIAGNOSIS — I2510 Atherosclerotic heart disease of native coronary artery without angina pectoris: Secondary | ICD-10-CM | POA: Insufficient documentation

## 2021-09-12 DIAGNOSIS — E785 Hyperlipidemia, unspecified: Secondary | ICD-10-CM | POA: Insufficient documentation

## 2021-09-12 LAB — ECHOCARDIOGRAM COMPLETE
AR max vel: 0.99 cm2
AV Area VTI: 1.12 cm2
AV Area mean vel: 1.01 cm2
AV Mean grad: 20 mmHg
AV Peak grad: 37.5 mmHg
Ao pk vel: 3.06 m/s
Area-P 1/2: 2.79 cm2
P 1/2 time: 503 msec
S' Lateral: 3.3 cm

## 2021-09-13 NOTE — Progress Notes (Signed)
Spoke with Mr. Uzzle and his wife.   Updated date of surgery: 09/17/21  Updated time of arrival: 0645 AM  Patient will be discharged from hospital and monitored at home for 24 hours by: Loletha Carrow (wife) [phone]  Patient denies any changes in allergies, medications, medical history since pre op appointment on: 08/28/21 except COVID positive on 09/05/21.  Denies any symptoms from having COVID on 09/05/21.  Pre op instructions reviewed, follow up questions addressed and patient verbalized understanding at this time.

## 2021-09-16 NOTE — Anesthesia Preprocedure Evaluation (Signed)
Anesthesia Evaluation  Patient identified by MRN, date of birth, ID band Patient awake    Reviewed: Allergy & Precautions, NPO status , Patient's Chart, lab work & pertinent test results  Airway Mallampati: I  TM Distance: >3 FB Neck ROM: Full    Dental no notable dental hx. (+) Edentulous Lower, Edentulous Upper   Pulmonary former smoker,    Pulmonary exam normal breath sounds clear to auscultation       Cardiovascular hypertension, + CAD and + Cardiac Stents  Normal cardiovascular exam+ Valvular Problems/Murmurs AS  Rhythm:Regular Rate:Normal     Neuro/Psych    GI/Hepatic   Endo/Other  diabetes  Renal/GU CRFRenal diseaseLab Results      Component                Value               Date                      CREATININE               3.75 (H)            08/28/2021              K                        4.6                 08/28/2021                   Musculoskeletal  (+) Arthritis ,   Abdominal   Peds  Hematology  (+) Blood dyscrasia, anemia , Lab Results      Component                Value               Date                          HGB                      12.4 (L)            08/28/2021                HCT                      38.1 (L)            08/28/2021                PLT                      241                 08/28/2021              Anesthesia Other Findings   Reproductive/Obstetrics                            Anesthesia Physical Anesthesia Plan  ASA: 4  Anesthesia Plan: General   Post-op Pain Management:    Induction: Intravenous  PONV Risk Score and Plan: Treatment may vary due to age or medical condition  Airway Management Planned: LMA  Additional Equipment: None  Intra-op Plan:   Post-operative Plan:  Informed Consent: I have reviewed the patients History and Physical, chart, labs and discussed the procedure including the risks, benefits and alternatives for  the proposed anesthesia with the patient or authorized representative who has indicated his/her understanding and acceptance.     Dental advisory given  Plan Discussed with:   Anesthesia Plan Comments:        Anesthesia Quick Evaluation

## 2021-09-17 ENCOUNTER — Encounter (HOSPITAL_COMMUNITY): Payer: Self-pay | Admitting: Urology

## 2021-09-17 ENCOUNTER — Ambulatory Visit (HOSPITAL_COMMUNITY): Payer: Medicare HMO | Admitting: Physician Assistant

## 2021-09-17 ENCOUNTER — Encounter (HOSPITAL_COMMUNITY)
Admission: RE | Disposition: A | Discharge status: Home / Self Care | Payer: Self-pay | Source: Home / Self Care | Attending: Urology

## 2021-09-17 ENCOUNTER — Ambulatory Visit (HOSPITAL_COMMUNITY)
Admission: RE | Admit: 2021-09-17 | Discharge: 2021-09-17 | Disposition: A | Discharge status: Home / Self Care | Payer: Medicare HMO | Attending: Urology | Admitting: Urology

## 2021-09-17 ENCOUNTER — Ambulatory Visit (HOSPITAL_BASED_OUTPATIENT_CLINIC_OR_DEPARTMENT_OTHER): Payer: Medicare HMO | Admitting: Certified Registered"

## 2021-09-17 DIAGNOSIS — I129 Hypertensive chronic kidney disease with stage 1 through stage 4 chronic kidney disease, or unspecified chronic kidney disease: Secondary | ICD-10-CM

## 2021-09-17 DIAGNOSIS — R338 Other retention of urine: Secondary | ICD-10-CM | POA: Insufficient documentation

## 2021-09-17 DIAGNOSIS — Z87891 Personal history of nicotine dependence: Secondary | ICD-10-CM | POA: Insufficient documentation

## 2021-09-17 DIAGNOSIS — I2510 Atherosclerotic heart disease of native coronary artery without angina pectoris: Secondary | ICD-10-CM

## 2021-09-17 DIAGNOSIS — R3912 Poor urinary stream: Secondary | ICD-10-CM | POA: Insufficient documentation

## 2021-09-17 DIAGNOSIS — C679 Malignant neoplasm of bladder, unspecified: Secondary | ICD-10-CM | POA: Diagnosis present

## 2021-09-17 DIAGNOSIS — Z955 Presence of coronary angioplasty implant and graft: Secondary | ICD-10-CM | POA: Insufficient documentation

## 2021-09-17 DIAGNOSIS — I10 Essential (primary) hypertension: Secondary | ICD-10-CM | POA: Diagnosis not present

## 2021-09-17 DIAGNOSIS — E119 Type 2 diabetes mellitus without complications: Secondary | ICD-10-CM | POA: Insufficient documentation

## 2021-09-17 DIAGNOSIS — N401 Enlarged prostate with lower urinary tract symptoms: Secondary | ICD-10-CM | POA: Insufficient documentation

## 2021-09-17 DIAGNOSIS — Z794 Long term (current) use of insulin: Secondary | ICD-10-CM | POA: Insufficient documentation

## 2021-09-17 DIAGNOSIS — D494 Neoplasm of unspecified behavior of bladder: Secondary | ICD-10-CM

## 2021-09-17 DIAGNOSIS — N35912 Unspecified bulbous urethral stricture, male: Secondary | ICD-10-CM

## 2021-09-17 DIAGNOSIS — N3943 Post-void dribbling: Secondary | ICD-10-CM | POA: Diagnosis not present

## 2021-09-17 DIAGNOSIS — N5201 Erectile dysfunction due to arterial insufficiency: Secondary | ICD-10-CM | POA: Insufficient documentation

## 2021-09-17 DIAGNOSIS — N1832 Chronic kidney disease, stage 3b: Secondary | ICD-10-CM

## 2021-09-17 HISTORY — PX: TRANSURETHRAL RESECTION OF BLADDER TUMOR WITH MITOMYCIN-C: SHX6459

## 2021-09-17 LAB — GLUCOSE, CAPILLARY
Glucose-Capillary: 121 mg/dL — ABNORMAL HIGH (ref 70–99)
Glucose-Capillary: 106 mg/dL — ABNORMAL HIGH (ref 70–99)

## 2021-09-17 SURGERY — TRANSURETHRAL RESECTION OF BLADDER TUMOR WITH MITOMYCIN-C
Anesthesia: General

## 2021-09-17 MED ORDER — PROPOFOL 10 MG/ML IV BOLUS
INTRAVENOUS | Status: AC
Start: 2021-09-17 — End: ?
  Filled 2021-09-17: qty 20

## 2021-09-17 MED ORDER — SODIUM CHLORIDE 0.9 % IR SOLN
Status: DC | PRN
  Administered 2021-09-17: 3000 mL via INTRAVESICAL

## 2021-09-17 MED ORDER — CEFAZOLIN SODIUM-DEXTROSE 2-4 GM/100ML-% IV SOLN
2.0000 g | INTRAVENOUS | Status: AC
  Administered 2021-09-17: 2 g via INTRAVENOUS
  Filled 2021-09-17: qty 100

## 2021-09-17 MED ORDER — ONDANSETRON HCL 4 MG/2ML IJ SOLN
INTRAMUSCULAR | Status: AC
  Filled 2021-09-17: qty 2

## 2021-09-17 MED ORDER — FENTANYL CITRATE (PF) 100 MCG/2ML IJ SOLN
INTRAMUSCULAR | Status: DC | PRN
  Administered 2021-09-17: 50 ug via INTRAVENOUS

## 2021-09-17 MED ORDER — CHLORHEXIDINE GLUCONATE 0.12 % MT SOLN
15.0000 mL | Freq: Once | OROMUCOSAL | Status: AC
  Administered 2021-09-17: 15 mL via OROMUCOSAL

## 2021-09-17 MED ORDER — GEMCITABINE CHEMO FOR BLADDER INSTILLATION 2000 MG
2000.0000 mg | Freq: Once | INTRAVENOUS | Status: DC
Start: 2021-09-17 — End: 2021-09-17

## 2021-09-17 MED ORDER — PHENYLEPHRINE 80 MCG/ML (10ML) SYRINGE FOR IV PUSH (FOR BLOOD PRESSURE SUPPORT)
PREFILLED_SYRINGE | INTRAVENOUS | Status: AC
  Filled 2021-09-17: qty 10

## 2021-09-17 MED ORDER — FENTANYL CITRATE PF 50 MCG/ML IJ SOSY
PREFILLED_SYRINGE | INTRAMUSCULAR | Status: AC
  Filled 2021-09-17: qty 2

## 2021-09-17 MED ORDER — LIDOCAINE 2% (20 MG/ML) 5 ML SYRINGE
INTRAMUSCULAR | Status: DC | PRN
  Administered 2021-09-17: 100 mg via INTRAVENOUS

## 2021-09-17 MED ORDER — ONDANSETRON HCL 4 MG/2ML IJ SOLN
INTRAMUSCULAR | Status: DC | PRN
  Administered 2021-09-17: 4 mg via INTRAVENOUS

## 2021-09-17 MED ORDER — ORAL CARE MOUTH RINSE: 15.0000 mL | Freq: Once | OROMUCOSAL | Status: AC

## 2021-09-17 MED ORDER — ACETAMINOPHEN 10 MG/ML IV SOLN: 1000.0000 mg | Freq: Once | INTRAVENOUS | Status: DC | PRN

## 2021-09-17 MED ORDER — FENTANYL CITRATE PF 50 MCG/ML IJ SOSY
PREFILLED_SYRINGE | INTRAMUSCULAR | Status: AC
  Filled 2021-09-17: qty 1

## 2021-09-17 MED ORDER — PROPOFOL 10 MG/ML IV BOLUS
INTRAVENOUS | Status: DC | PRN
  Administered 2021-09-17: 150 mg via INTRAVENOUS

## 2021-09-17 MED ORDER — LACTATED RINGERS IV SOLN: INTRAVENOUS | Status: DC

## 2021-09-17 MED ORDER — FENTANYL CITRATE (PF) 100 MCG/2ML IJ SOLN
INTRAMUSCULAR | Status: AC
  Filled 2021-09-17: qty 2

## 2021-09-17 MED ORDER — STERILE WATER FOR IRRIGATION IR SOLN
Status: DC | PRN
  Administered 2021-09-17: 1000 mL

## 2021-09-17 MED ORDER — PHENYLEPHRINE 80 MCG/ML (10ML) SYRINGE FOR IV PUSH (FOR BLOOD PRESSURE SUPPORT)
PREFILLED_SYRINGE | INTRAVENOUS | Status: DC | PRN
  Administered 2021-09-17 (×2): 80 ug via INTRAVENOUS
  Administered 2021-09-17: 120 ug via INTRAVENOUS
  Administered 2021-09-17: 80 ug via INTRAVENOUS

## 2021-09-17 MED ORDER — FENTANYL CITRATE PF 50 MCG/ML IJ SOSY
25.0000 ug | PREFILLED_SYRINGE | INTRAMUSCULAR | Status: DC | PRN
  Administered 2021-09-17 (×2): 50 ug via INTRAVENOUS

## 2021-09-17 MED ORDER — DEXAMETHASONE SODIUM PHOSPHATE 10 MG/ML IJ SOLN
INTRAMUSCULAR | Status: AC
  Filled 2021-09-17: qty 1

## 2021-09-17 MED ORDER — DEXAMETHASONE SODIUM PHOSPHATE 10 MG/ML IJ SOLN
INTRAMUSCULAR | Status: DC | PRN
  Administered 2021-09-17: 4 mg via INTRAVENOUS

## 2021-09-17 MED ORDER — GEMCITABINE CHEMO FOR BLADDER INSTILLATION 2000 MG
2000.0000 mg | Freq: Once | INTRAVENOUS | Status: AC
  Administered 2021-09-17: 2000 mg via INTRAVESICAL
  Filled 2021-09-17: qty 2000

## 2021-09-17 MED ORDER — DEXMEDETOMIDINE (PRECEDEX) IN NS 20 MCG/5ML (4 MCG/ML) IV SYRINGE
PREFILLED_SYRINGE | INTRAVENOUS | Status: AC
  Filled 2021-09-17: qty 5

## 2021-09-17 MED ORDER — ONDANSETRON HCL 4 MG/2ML IJ SOLN: 4.0000 mg | Freq: Once | INTRAMUSCULAR | Status: DC | PRN

## 2021-09-17 SURGICAL SUPPLY — 18 items
BAG URINE DRAIN 2000ML AR STRL (UROLOGICAL SUPPLIES) IMPLANT
BAG URO CATCHER STRL LF (MISCELLANEOUS) ×2 IMPLANT
CATH FOLEY 2WAY SLVR  5CC 18FR (CATHETERS) ×1
CATH FOLEY 2WAY SLVR 5CC 18FR (CATHETERS) IMPLANT
DRAPE FOOT SWITCH (DRAPES) ×2 IMPLANT
ELECT REM PT RETURN 15FT ADLT (MISCELLANEOUS) ×2 IMPLANT
GLOVE BIO SURGEON STRL SZ7.5 (GLOVE) ×2 IMPLANT
GOWN STRL REUS W/ TWL XL LVL3 (GOWN DISPOSABLE) ×1 IMPLANT
GOWN STRL REUS W/TWL XL LVL3 (GOWN DISPOSABLE) ×3
KIT TURNOVER KIT A (KITS) IMPLANT
LOOP CUT BIPOLAR 24F LRG (ELECTROSURGICAL) ×1 IMPLANT
MANIFOLD NEPTUNE II (INSTRUMENTS) ×2 IMPLANT
PACK CYSTO (CUSTOM PROCEDURE TRAY) ×2 IMPLANT
PLUG CATH AND CAP STER (CATHETERS) IMPLANT
SYR TOOMEY IRRIG 70ML (MISCELLANEOUS)
SYRINGE TOOMEY IRRIG 70ML (MISCELLANEOUS) IMPLANT
TUBING CONNECTING 10 (TUBING) ×2 IMPLANT
TUBING UROLOGY SET (TUBING) ×2 IMPLANT

## 2021-09-17 NOTE — H&P (Signed)
CC/HPI: CC: Gross hematuria  HPI:  09/18/2020  Patient went to the emergency department on 09/13/2020 with hematuria with clots. He was found to have a hemoglobin of 12.5, creatinine 2.21. No imaging. Foley catheter was placed and his bladder was irrigated. He no longer has a Foley catheter. He had an episode of gross hematuria 4 years ago and underwent cystoscopy that was negative reportedly. He has a history of prostate cancer Gleason 5 + 4 with some pelvic adenopathy. He had combination ADT with radiation. He does not recall his last PSA. Previously followed in [place]. He denies any voiding complaints other than the gross hematuria. No dysuria. No pain.   10/18/2020  Patient underwent a CT IVP. This revealed no evidence of genitourinary malignancy. No stones. He continues to have intermittent gross hematuria sometimes with clots. He is voiding well and emptying his bladder. He presents for cystoscopy.   11/17/2020  Since last visit, the patient presented to the emergency department with gross hematuria with clot retention. He was taken the operating room for cystoscopy with clot evacuation where he was found to have a small superficial tumor. 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 Tumor fragments measured 0.1 x 0.1 cm. He has no further gross hematuria. He received 1 more unit of PRBCs on Tuesday for symptomatic acute blood-loss anemia. He also complains about some weak stream.   01/04/2021  Patient comes in with primary complaints of penile swelling and difficulty voiding. PVR is 390. He is voiding small amounts. Does not feel like he is in retention. He is not uncomfortable. Wants to avoid a catheter if possible. Occasional dysuria. He is circumcised. Residual Foreskin does not typically cover his glans.   01/12/2021: Catheter placement suggested at last office visit but patient declined. Treated empirically for underlying infectious process with cephalexin. Urine  culture resulted positive for strep viridans. He is on tamsulosin. Now back today for f/u visit with PVR.   Today better, penile edema has resolved. He states he feels like stream is improving as well. Still weak but now more consistent and with less hesitancy/straining. He states he feels like he is emptying more appropriately at this point, he does continue with an elevated residual decreased to 320 mL on today's exam, however PVR was done about an hour after he presented to the office so likely not reflective of a true residual. He denies any interval dysuria or gross hematuria. Nocturia stable 2-3 times nightly. He continues to struggle with lower extremity edema but wears compression stockings.   02/13/2021  Patient presents today for surveillance cystoscopy. He has occasional splitting of his urinary stream. He has had issues with incomplete bladder emptying. He does not feel like he is having significant difficulty voiding.   05/10/2021  Patient is status post DVIU with optilum urethral dilation. He presents today for surveillance cystoscopy.   08/07/2021  Patient presents for surveillance cystoscopy. He has been having some incomplete bladder emptying, frequency, postvoid dribbling. States he has not been taking tamsulosin. He also complains about some erectile dysfunction. Getting some erection but not enough for penetration. Has never tried sildenafil. Would like to try this. He denies nitrates/nitroglycerin.   09/17/2021 Patient presents today for TURBT with instillation of gemcitabine.    ALLERGIES: No Known Drug Allergies    MEDICATIONS: Tamsulosin Hcl 0.4 mg capsule TAKE 1 CAPSULE EVERY DAY  Amlodipine Besylate 10 mg tablet Oral  Atorvastatin Calcium 10 mg tablet  Carvedilol 25 mg tablet  Hydrocodone-Acetaminophen  Iron  Lisinopril 40 mg tablet Oral  Novolin 70-30     GU PSH: Cysto Dilate Stricture (M or F) - 03/12/2021 Cystoscopy - 05/10/2021, 02/13/2021, 10/18/2020 Cystoscopy VIU  - 03/12/2021       PSH Notes: Cholecystectomy, Enteroscopic Polypectomy, Knee Surgery   NON-GU PSH: Cardiac Stent Placement Cholecystectomy (open) - 2008 Extensive Prostate Surgery Small Bowel Endoscopy - 2008     GU PMH: Bladder Cancer Lateral - 05/10/2021, - 03/16/2021, - 02/13/2021, - 01/04/2021, - 11/17/2020 Bulbar urethral stricture - 05/10/2021, - 03/16/2021, - 02/13/2021 Prostate Cancer - 05/10/2021, - 11/17/2020, - 10/18/2020 (Stable), - 09/18/2020, Prostate cancer, - 2014, Prostate cancer, - 2014 Acute Cystitis/UTI - 01/12/2021, - 01/04/2021 Incomplete bladder emptying - 01/12/2021, - 01/04/2021 Gross hematuria - 11/17/2020, - 10/18/2020, - 10/03/2020, - 09/18/2020 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, Erectile dysfunction due to arterial insufficiency - 2014 Hydrocele, Unspec, Hydrocele, left - 2014      PMH Notes:  1898-04-08 00:00:00 - Note: Normal Routine History And Physical Senior Citizen (65-80)   NON-GU PMH: Diabetes Type 2, Diabetes mellitus - 2014 Hypertension, Hypertension - 2014 Personal history of other diseases of the circulatory system, History of hypertension - 2014 Personal history of other endocrine, nutritional and metabolic disease, History of diabetes mellitus - 2014, History of hypercholesterolemia, - 2014    FAMILY HISTORY: 2 daughters - Other 1 son - Other Acute Myocardial Infarction - Runs In Family Adenocarcinoma Of The Pancreas - Mother Death In The Family Father - Runs In Family Death In The Family Mother - Runs In Family Family Health Status Number - Runs In Family Heart Disease - Runs In Family leukemia - Runs In Family   SOCIAL HISTORY: Marital Status: Married Preferred Language: English; Race: Black or African American Current Smoking Status: Patient does not smoke anymore. Has not smoked since 09/07/1990.   Tobacco Use Assessment Completed: Used Tobacco in last 30 days? Drinks 3 caffeinated drinks per day.      Notes: Tobacco Use, Caffeine Use, Alcohol Use, Marital History - Single   REVIEW OF SYSTEMS:    GU Review Male:   Patient denies frequent urination, hard to postpone urination, burning/ pain with urination, get up at night to urinate, leakage of urine, stream starts and stops, trouble starting your stream, have to strain to urinate , erection problems, and penile pain.  Gastrointestinal (Upper):   Patient denies nausea, vomiting, and indigestion/ heartburn.  Gastrointestinal (Lower):   Patient denies diarrhea and constipation.  Constitutional:   Patient denies fever, night sweats, weight loss, and fatigue.  Skin:   Patient denies skin rash/ lesion and itching.  Eyes:   Patient denies blurred vision and double vision.  Ears/ Nose/ Throat:   Patient denies sore throat and sinus problems.  Hematologic/Lymphatic:   Patient denies swollen glands and easy bruising.  Cardiovascular:   Patient denies leg swelling and chest pains.  Respiratory:   Patient denies cough and shortness of breath.  Endocrine:   Patient denies excessive thirst.  Musculoskeletal:   Patient denies back pain and joint pain.  Neurological:   Patient denies headaches and dizziness.  Psychologic:   Patient denies depression and anxiety.   BP (!) 187/78   Pulse 71   Temp 98.2 F (36.8 C) (Oral)   Resp 20   Wt 104.1 kg   SpO2 99%   BMI 31.14 kg/m    Complexity of Data:  Source Of History:  Patient  Records Review:   Previous Doctor  Records, Previous Patient Records  Urine Test Review:   Urinalysis   09/18/20 12/21/08 08/19/08 04/18/08 12/18/06  PSA  Total PSA 0.19 ng/mL <0.04  <0.04  0.29  5.03   Free PSA     1.73   % Free PSA     34.4     08/19/08  Hormones  Testosterone, Total <20.0     PROCEDURES:         Flexible Cystoscopy - 52000  Risks, benefits, and some of the potential complications of the procedure were discussed at length with the patient including infection, bleeding, voiding discomfort,  urinary retention, fever, chills, sepsis, and others. All questions were answered. Informed consent was obtained. Antibiotic prophylaxis was given. Sterile technique and intraurethral analgesia were used.  Meatus:  Normal size. Normal location. Normal condition.  Urethra:  Evidence of prior scar tissue but urethra was wide open and scope easily passed through  External Sphincter:  Normal.  Verumontanum:  Normal.  Prostate:  Obstructing. Severe hyperplasia.  Bladder Neck:  Non-obstructing.  Ureteral Orifices:  Normal location. Normal size. Normal shape.   Bladder:  Moderate trabeculation. 1 cm papillary tumor on the left posterior bladder wall just beyond the ureteral orifice      The lower urinary tract was carefully examined. The procedure was well-tolerated and without complications. Antibiotic instructions were given. Instructions were given to call the office immediately for bloody urine, difficulty urinating, urinary retention, painful or frequent urination, fever, chills, nausea, vomiting or other illness. The patient stated that he understood these instructions and would comply with them.          ASSESSMENT:      ICD-10 Details  1 GU:   Bladder Cancer Lateral - C67.2 Chronic, Worsening  2   Bulbar urethral stricture - N35.011 Chronic, Stable, Resolved  3   Prostate Cancer - C61 Chronic, Stable  4   BPH w/LUTS - N40.1 Chronic, Stable  5   Post-void dribbling - N39.43 Chronic, Worsening  6   Urinary Frequency - R35.0 Chronic, Worsening  7   Weak Urinary Stream - R39.12 Chronic, Worsening  8   ED due to arterial insufficiency - N52.01      PLAN:              Notes:   Plan for TURBT with instillation of gemcitabine.

## 2021-09-17 NOTE — Anesthesia Postprocedure Evaluation (Signed)
Anesthesia Post Note  Patient: [name]  Procedure(s) Performed: TRANSURETHRAL RESECTION OF BLADDER TUMOR WITH GEMCITABINE     Patient location during evaluation: PACU Anesthesia Type: General Level of consciousness: awake and alert Pain management: pain level controlled Vital Signs Assessment: post-procedure vital signs reviewed and stable Respiratory status: spontaneous breathing, nonlabored ventilation, respiratory function stable and patient connected to nasal cannula oxygen Cardiovascular status: blood pressure returned to baseline and stable Postop Assessment: no apparent nausea or vomiting Anesthetic complications: no   No notable events documented.  Last Vitals:  Vitals:   09/17/21 1145 09/17/21 1200  BP: (!) 168/83 (!) 179/88  Pulse: 77 81  Resp: (!) 9 18  Temp: 36.7 C 36.7 C  SpO2: 96% 97%    Last Pain:  Vitals:   09/17/21 1200  TempSrc:   PainSc: 0-No pain                 Barnet Glasgow

## 2021-09-17 NOTE — Op Note (Signed)
Operative Note  Preoperative diagnosis:  1.  Bladder tumor  Postoperative diagnosis: 1.  Bladder tumor--small 2.  Bulbar urethral stricture  Procedure(s): 1.  Transurethral resection of bladder tumor--small 2.  Intravesical instillation of gemcitabine 3.  Urethral dilation  Surgeon: Link Snuffer, MD  Assistants: None  Anesthesia: General  Complications: None immediate  EBL: Minimal  Specimens: 1.  Bladder tumor  Drains/Catheters: 1.  18 French Foley catheter  Intraoperative findings: 1.  Normal pendulous urethra.  Edematous urethral stricture, about 18-20 [place].  Was passively dilated. 2.  Small approximate 1 cm superficial appearing papillary bladder tumor on the left posterior wall just beyond the ureteral orifice.  Ureteral orifice was avoided during resection.  Indication: 88 year old male with a history of bladder cancer found to have recurrence.  He presents for the previously mentioned operation.  Description of procedure:  The patient was identified and consent was obtained.  The patient was taken to the operating room and placed in the supine position.  The patient was placed under general anesthesia.  Perioperative antibiotics were administered.  The patient was placed in dorsal lithotomy.  Patient was prepped and draped in a standard sterile fashion and a timeout was performed.  The urethral meatus was little bit tight for a resectoscope and therefore I sequentially dilated with sounds from 20 [place] up to 28 [place].  I was then able to pass a 38 [place] resectoscope with the visual obturator in place into the urethra.  There was an approximately 18-20 French bulbar urethral stricture.  This was able to be easily transversed with the scope and passively dilated.  I advanced the scope into the bladder and exchanged for the bipolar working element.  I then resected the tumor of interest.  It was resected completely.  I collected the specimen.  I fulgurated the resection  bed and surrounding areas.  Tumor size was approximately 1 cm, area resected and fulgurated spanned about 2.5 cm.  I withdrew the scope and placed an 35 French Foley catheter.  Patient tolerated the procedure well was stable postoperative.  In the PACU, I instilled intravesical gemcitabine.  This remained for approximately 1 hour prior to proper disposal.  Plan: Follow-up in 3 days for voiding trial.

## 2021-09-17 NOTE — Transfer of Care (Signed)
Immediate Anesthesia Transfer of Care Note  Patient: [name]  Procedure(s) Performed: TRANSURETHRAL RESECTION OF BLADDER TUMOR WITH GEMCITABINE  Patient Location: PACU  Anesthesia Type:General  Level of Consciousness: awake, drowsy and patient cooperative  Airway & Oxygen Therapy: Patient Spontanous Breathing and Patient connected to face mask oxygen  Post-op Assessment: Report given to RN and Post -op Vital signs reviewed and stable  Post vital signs: Reviewed and stable  Last Vitals:  Vitals Value Taken Time  BP 162/69 09/17/21 0951  Temp    Pulse 69 09/17/21 0952  Resp 14 09/17/21 0952  SpO2 100 % 09/17/21 0952  Vitals shown include unvalidated device data.  Last Pain:  Vitals:   09/17/21 0804  TempSrc:   PainSc: 0-No pain         Complications: No notable events documented.

## 2021-09-17 NOTE — Discharge Instructions (Signed)

## 2021-09-17 NOTE — Anesthesia Procedure Notes (Signed)
Procedure Name: LMA Insertion Date/Time: 09/17/2021 9:13 AM  Performed by: Eben Burow, CRNAPre-anesthesia Checklist: Patient identified, Emergency Drugs available, Suction available, Patient being monitored and Timeout performed Patient Re-evaluated:Patient Re-evaluated prior to induction Oxygen Delivery Method: Circle system utilized Preoxygenation: Pre-oxygenation with 100% oxygen Induction Type: IV induction Ventilation: Mask ventilation without difficulty LMA: LMA inserted and LMA with gastric port inserted LMA Size: 4.0 Number of attempts: 1 Tube secured with: Tape Dental Injury: Teeth and Oropharynx as per pre-operative assessment

## 2021-09-18 ENCOUNTER — Encounter (HOSPITAL_COMMUNITY): Payer: Self-pay | Admitting: Urology

## 2021-09-18 LAB — SURGICAL PATHOLOGY

## 2021-11-28 ENCOUNTER — Other Ambulatory Visit: Payer: Self-pay | Admitting: Cardiovascular Disease

## 2021-11-28 DIAGNOSIS — R0609 Other forms of dyspnea: Secondary | ICD-10-CM

## 2021-11-28 DIAGNOSIS — I4729 Other ventricular tachycardia: Secondary | ICD-10-CM

## 2021-11-28 DIAGNOSIS — I10 Essential (primary) hypertension: Secondary | ICD-10-CM

## 2021-11-28 DIAGNOSIS — I2510 Atherosclerotic heart disease of native coronary artery without angina pectoris: Secondary | ICD-10-CM

## 2021-12-18 NOTE — Progress Notes (Signed)
Cardiology Office Note:    Date:  12/20/2021   ID:  [name], DOB [birth year], MRN [id number]  PCP:  Robyne Peers, MD  Cardiologist:  Quay Burow, MD  Electrophysiologist:  Thompson Grayer, MD   Referring MD: Robyne Peers, MD   Chief Complaint: shortness of breath and lower extremity edema  History of Present Illness:    [name] is a 88 y.o. male with a history of CAD s/p DES to LAD in 01/2008, non-sustained VT, moderate aortic stenosis, hypertension, hyperlipidemia, type 2 diabetes mellitus on insulin, CKD stage IV, and chronic back pain who is followed by Dr. [name] and presents today for further evaluation of shortness of breath and lower extremity edema.  Patient has a long history of CAD.  He ultimately underwent PTCA and stenting with DES to LAD on 10//2009.  He was readmitted in 03/2008 for atypical chest pain with nonsustained VT and underwent a nuclear stress test which showed no ischemia.  He presented again in 05/2008 with recurrent chest pain and underwent repeat cardiac catheterization which showed widely patent LAD stent and otherwise nonobstructive disease in the LCx and RCA.  He underwent attempt at VT ablation by Dr. Rayann Heman in 05/2008; however, this was aborted because of inappropriate substrate.  Last ischemic evaluation was a nuclear stress test in 04/2011 which showed no evidence of ischemia.  Renal artery ultrasound in 03/2019 showed no evidence of renal artery stenosis. Patient was last seen by Dr. [name] in 02/2021 at which time he reports some dyspnea but denied any chest pain.  He was having some issues with hematuria and prostate cancer as well as profound anemia secondary to untreated hematuria.  Right carotid bruit was noted on exam; therefore, a carotid ultrasound was ordered and was essentially normal with no evidence of stenosis in the right ICA and only 1-39% stenosis of the left ICA.  Repeat echo was also ordered for routine monitoring of his  aortic stenosis.  Echo in 09/2021 showed LVEF of 60-65% with no regional wall motion abnormalities, moderate LVH, and grade 2 diastolic dysfunction, normal RV, biatrial enlargement, and severe calcification of the aortic valve with moderate AS (mean gradient 20 mmHg).  Patient presents today for further evaluation of shortness of breath and lower extremity edema. Patient states his Nephrologist (Dr. Royce Macadamia) recently started him on Hydralazine and since starting this he noticed some mild shortness of breath and lower extremity edema. He was initially started on 6m three times daily but then this was increased to 550mthree times weekly at the end of August. He noticed his shortness of breath and lower extremity edema worsened even more on high dose. He describes dyspnea on exertion as well orthopnea (he sleeps on 3 pillows and then typically moves to a recliner in the middle of the night) which he has had for about 1 year and then PND. He stopped taking the Hydralazine about a week ago and feels 80% better with this. He feels like both his shortness of breath and edema have improved. He still has some lower extremity edema at the end of the day but much better. No chest pain, palpitations, lightheadedness, dizziness, syncope.   BP is quite elevated today at 180/84 and then 172/78 on my personal recheck at the end of visit. However, he has not taken his Amlodipine or Coreg this morning.   Past Medical History:  Diagnosis Date   Allergy    Anemia    CAD (coronary artery  disease)    pci to LAD 10/09; stable CAD by cath 05/31/08; myoview 04/11/11- no ishcemia, EF 67%; echo 05/15/09- EF>55%, mod calcification of the aortic valve leaflets   CAD S/P percutaneous coronary angioplasty 05/08/2008   LAD PCI with DES 2009- Myoview low risk 2013   CHF (congestive heart failure) ([place]) 09/07/2019   Chronic back pain    Chronic kidney disease    Cystitis, radiation 12/01/2013   Dyspnea    ED (erectile dysfunction)     Elevated serum creatinine 12/14/2015   Essential hypertension, benign    Hematuria 12/01/2013   Hyperlipidemia    Ileus ([place]) 12/14/2015   Insulin dependent type 2 diabetes mellitus, controlled ([place])    Malignant neoplasm of prostate ([place]) 11/26/2013   Multiple rib fractures    left 9th, 10th, and 11th posterior rib fractures S/P fall 12/09/2015/notes 12/12/2015   Osteoarthritis of right knee 03/04/2018   Personal history of COVID-19 09/05/2021   PREMATURE VENTRICULAR CONTRACTIONS 05/08/2008   Qualifier: Diagnosis of  By: Rayann Heman, MD, James     Prostate cancer [facility])    s/p   Rectal bleeding 12/23/2012   Evaluated by Dr Benson [facility] patient with history of radiation proctitis. Patient due for colonoscopy on 04/2014    Type II or unspecified type diabetes mellitus without mention of complication, not stated as uncontrolled    Ventricular tachycardia ([place])    resuscitated; monitor 05/2008; attempted T ablation 2/10- aborted due to inappropriate substrate    Past Surgical History:  Procedure Laterality Date   CARDIAC CATHETERIZATION  05/31/08   EF 55-60%, stable lesions, medical therapy   COLECTOMY  '80's   polyps   CORONARY ANGIOPLASTY WITH STENT PLACEMENT  01/19/08   PCI stent to mid LAD with Promus DES 27.5x28   CYSTOSCOPY WITH DIRECT VISION INTERNAL URETHROTOMY [name] 03/12/2021   Procedure: cystoscoopy withdirect vision internal urethrotomy optilum urethral dilation     ;  Surgeon: Lucas Mallow, MD;  Location: [facility];  Service: Urology;  Laterality: [name];  RERQUESTING 45 MINS   IR GENERIC HISTORICAL  01/03/2016   IR THORACENTESIS ASP PLEURAL SPACE W/IMG GUIDE 01/03/2016 [facility]   KNEE ARTHROPLASTY Right 03/04/2018   Procedure: COMPUTER ASSISTED TOTAL KNEE ARTHROPLASTY;  Surgeon: Rod Can, MD;  Location: [facility];  Service: Orthopedics;  Laterality: Right;   PTCA  01/2008   TRANSURETHRAL RESECTION OF BLADDER TUMOR [name] 11/10/2020   Procedure: Silvestre Moment;  Surgeon: Cleon Gustin, MD;  Location: [facility];  Service: Urology;  Laterality: [name];   TRANSURETHRAL RESECTION OF BLADDER TUMOR WITH MITOMYCIN-C [name] 09/17/2021   Procedure: TRANSURETHRAL RESECTION OF BLADDER TUMOR WITH GEMCITABINE;  Surgeon: Lucas Mallow, MD;  Location: [facility];  Service: Urology;  Laterality: [name];    Current Medications: Current Meds  Medication Sig   acetaminophen (TYLENOL) 500 MG tablet Take 1,000 mg by mouth in the morning.   amLODipine (NORVASC) 10 MG tablet TAKE 1 TABLET EVERY DAY (APPOINTMENT IS NEEDED FOR REFILLS) (Patient taking differently: Take 10 mg by mouth daily.)   atorvastatin (LIPITOR) 10 MG tablet Take 1 tablet (10 mg total) by mouth daily.   Blood Glucose Monitoring Suppl (ACCU-CHEK AVIVA PLUS) w/Device KIT Test blood sugar 3 times daily. Dx code: E11.22   carvedilol (COREG) 25 MG tablet TAKE 1 TABLET TWICE DAILY. KEEP OFFICE VISIT (Patient taking differently: Take 25 mg by mouth 2 (two) times daily.)   Cholecalciferol (VITAMIN D-3 PO) Take 1 capsule by mouth daily.  furosemide (LASIX) 40 MG tablet TAKE 1 TABLET TWICE DAILY (NEED OFFICE VISIT FOR FUTURE REFILLS)   glucose blood (ACCU-CHEK AVIVA PLUS) test strip Test blood sugar 3 times daily. Dx code: E11.22   HYDROcodone-acetaminophen (NORCO) 5-325 MG tablet Take 1 tablet by mouth every 4 (four) hours as needed for moderate pain.   insulin NPH-regular Human (NOVOLIN 70/30) (70-30) 100 UNIT/ML injection Inject 11 Units into the skin 2 (two) times daily with a meal. (Patient taking differently: Inject 0-16 Units into the skin 2 (two) times daily with a meal. Per sliding scale.)   Insulin Syringe-Needle U-100 (B-D INS SYR HALF-UNIT .3CC/31G) 31G X 5/16" 0.3 ML MISC Use daily at bedtime to inject insulin. Dx code: 250.00   Lancets (ACCU-CHEK MULTICLIX) lancets Test blood sugar 3 times daily. Dx code: E11.22   Multiple Vitamin (MULTIVITAMIN) tablet Take 1 tablet by mouth daily.    Omega-3 Fatty Acids (FISH OIL PO) Take 1 capsule by mouth daily.   tamsulosin (FLOMAX) 0.4 MG CAPS capsule Take 0.4 mg by mouth daily.   [DISCONTINUED] isosorbide mononitrate (IMDUR) 30 MG 24 hr tablet Take 1 tablet (30 mg total) by mouth daily.     Allergies:   Patient has no known allergies.   Social History   Socioeconomic History   Marital status: Married    Spouse name: Not on file   Number of children: 7   Years of education: Not on file   Highest education level: Not on file  Occupational History   Occupation: RETIRED    Employer: RETIRED  Tobacco Use   Smoking status: Former    Packs/day: 0.00    Years: 15.00    Total pack years: 0.00    Types: Cigarettes    Quit date: 05/10/2017    Years since quitting: 4.6   Smokeless tobacco: Never   Tobacco comments:    Patient smokes occasionally. Not everyday.  Vaping Use   Vaping Use: Never used  Substance and Sexual Activity   Alcohol use: No   Drug use: No   Sexual activity: Never  Other Topics Concern   Not on file  Social History Narrative   Not on file   Social Determinants of Health   Financial Resource Strain: Not on file  Food Insecurity: Not on file  Transportation Needs: Not on file  Physical Activity: Not on file  Stress: Not on file  Social Connections: Not on file     Family History: The patient's family history includes Diabetes in his brother and mother; Heart attack in his mother; Heart disease in his brother and sister; Leukemia in his father.  ROS:   Please see the history of present illness.     EKGs/Labs/Other Studies Reviewed:    The following studies were reviewed:  Renal Artery Ultrasound 03/11/2019: Summary: Right: Normal size right kidney. No evidence of right renal artery         stenosis. RRV flow present.  Left:  Normal size of left kidney. No evidence of left renal artery         stenosis. LRV flow present.  _______________  Carotid Ultrasound 02/19/2021: Summary: -  Right Carotid: There is no evidence of stenosis in the right ICA. The  extracranial vessels were near-normal with only minimal wall thickening or plaque.  - Left Carotid: Velocities in the left ICA are consistent with a 1-39%  stenosis. The extracranial vessels were near-normal with only minimal  wall thickening or plaque.  -Vertebrals:  Right vertebral artery  demonstrates antegrade flow. Left  vertebral artery demonstrates high resistant flow.  - Subclavians: Normal flow hemodynamics were seen in bilateral subclavian arteries.  _______________  Echocardiogram 09/12/2021: Impressions: 1. The aortic valve is tricuspid. There is severe calcifcation of the  aortic valve. There is severe thickening of the aortic valve. Aortic valve  regurgitation is mild. Moderate aortic valve stenosis. Aortic valve area,  by VTI measures 1.12 cm. Aortic  valve mean gradient measures 20.0 mmHg. Aortic valve Vmax measures 3.06  m/s.   2. Left ventricular ejection fraction, by estimation, is 60 to 65%. The  left ventricle has normal function. The left ventricle has no regional  wall motion abnormalities. There is moderate concentric left ventricular  hypertrophy. Left ventricular  diastolic parameters are consistent with Grade II diastolic dysfunction  (pseudonormalization).   3. Right ventricular systolic function is normal. The right ventricular  size is normal.   4. Left atrial size was severely dilated.   5. Right atrial size was mildly dilated.   6. The mitral valve is grossly normal. Trivial mitral valve  regurgitation. No evidence of mitral stenosis.   Comparison(s): Changes from prior study are noted. AS is now moderate.   EKG:  EKG ordered today. EKG personally reviewed and demonstrates normal sinus rhythm, rate 79 bpm, with PACs but no acute TT/T changes. Right axis deviation. Normal PR and QRS intervals. QTc 421 ms.  Recent Labs: 02/07/2021: ALT 12 08/28/2021: BUN 44; Creatinine, Ser 3.75;  Hemoglobin 12.4; Platelets 241; Potassium 4.6; Sodium 137  Recent Lipid Panel    Component Value Date/Time   CHOL 125 02/07/2021 0920   TRIG 111 02/07/2021 0920   HDL 38 (L) 02/07/2021 0920   CHOLHDL 3.3 02/07/2021 0920   CHOLHDL 2.8 08/30/2015 0805   VLDL 20 08/30/2015 0805   LDLCALC 67 02/07/2021 0920    Physical Exam:    Vital Signs: BP (!) 180/84   Pulse 79   Ht 6' (1.829 m)   Wt 240 lb (108.9 kg)   SpO2 99%   BMI 32.55 kg/m     Wt Readings from Last 3 Encounters:  12/20/21 240 lb (108.9 kg)  09/17/21 229 lb 9.6 oz (104.1 kg)  08/28/21 229 lb 9.6 oz (104.1 kg)     General: 88 y.o. obese male in no acute distress. HEENT: Normocephalic and atraumatic. Sclera clear. EOMs intact. Neck: Supple. No JVD. Heart: RRR. Distinct S1 and S2. Soft I-II/VI systolic murmur. Radial  pulses 2+ and equal bilaterally. Lungs: No increased work of breathing. Clear to ausculation bilaterally. No wheezes, rhonchi, or rales.  Abdomen: Soft, non-distended, and non-tender to palpation.  Extremities: Trace lower extremity edema bilaterally. Skin: Warm and dry. Neuro: Alert and oriented x3. No focal deficits. Psych: Normal affect. Responds appropriately.   Assessment:    1. SOB (shortness of breath)   2. Swelling of lower extremity   3. Coronary artery disease involving native coronary artery of native heart without angina pectoris   4. NSVT (nonsustained ventricular tachycardia) ([place])   5. Primary hypertension   6. Hyperlipidemia, unspecified hyperlipidemia type   7. Type 2 diabetes mellitus with complication, with long-term current use of insulin ([place])   8. Chronic kidney disease (CKD), stage IV (severe) ([facility])     Plan:    Dyspnea Lower Extremity Edema Possible Chronic Diastolic CHF Patient reports dyspnea on exertion and lower extremity recently after being started on Hydralazine. Symptoms worsened when dose of this was increased and improved after stopping this a weeks  ago. He  still has a little shortness of breath and lower extremity edema but states he feels 80% better. He does have CHF listed in his chart but patient denies every being diagnosed with CHF. Recent echo in 09/2021 showed 60-65% with no regional wall motion abnormalities, moderate LVH, and grade 2 diastolic dysfunction as well as moderate aortic stenosis. Suspect he does have a degree of diastolic CHF. - Does not appear grossly volume overloaded on exam. - Will check BNP and BMET. - Remain off Hydralazine (will list this as an allergy). - Continue Lasix 49m twice daily. Given patients underlying CKD stage IV, may need assistance from Nephrology in adjusting Lasix depending on BNP and BMET results.  CAD History of DES to LAD in 2009.  Last ischemic evaluation was a Myoview in 2013 which showed no evidence of ischemia. -No chest pain. -No longer on aspirin due to hematuria/anemia.  -Continue statin.  Moderate Aortic Stenosis Noted on Echo in 09/2021. - Plan is for repeat Echo in 09/2022.   Non-Sustained VT Remote history of nonsustained VT.  A VT ablation was attempted in 2010 by Dr. ARayann Hemanbut this was aborted due to an appropriate substrate. -No recent issues. -Continue Coreg 25 mg twice daily.  Hypertension BP uncontrolled in the office today; however, he states he has not taken his BP medications this morning. - Continue current medications: Amlodipine 152mdaily and Coreg 2517mwice daily.  - Previously on Lisinopril but his Nephrologist stopped this.  - Did not tolerate Hydralazine as above. - Will start Imdur 71m57mily. - Asked patient to keep BP/HR log for weeks and bring home BP cuff to follow-up visit to make sure it is accurate.  Hyperlipidemia Lipid panel in 09/2021: Total Cholesterol 148, Triglycerides 134, HDL 41, LDL 80. -Continue Lipitor 10mg66mly. -Ideally would be on at least 40mg 39mipitor given underlying CAD but given advanced age,  I do not know if there is much utility  in being overly aggressive with this.  Okay to continue current dose.  Type 2 Diabetes Mellitus Hemoglobin A1c 7.2% in 08/2021. -On Insulin. -Management per PCP.  CKD Stage IV Recent baseline creatinine 2.5 to 2.7. -Will repeat BMET today.  -Followed by Nephrology.  Disposition: Follow up with me in 2 weeks.   Medication Adjustments/Labs and Tests Ordered: Current medicines are reviewed at length with the patient today.  Concerns regarding medicines are outlined above.  Orders Placed This Encounter  Procedures   B Nat Peptide   Basic Metabolic Panel (BMET)   EKG 12-Lead   Meds ordered this encounter  Medications   DISCONTD: isosorbide mononitrate (IMDUR) 30 MG 24 hr tablet    Sig: Take 1 tablet (30 mg total) by mouth daily.    Dispense:  90 tablet    Refill:  3   isosorbide mononitrate (IMDUR) 30 MG 24 hr tablet    Sig: Take 1 tablet (30 mg total) by mouth daily.    Dispense:  30 tablet    Refill:  0    Patient Instructions  Medication Instructions:  Your physician has recommended you make the following change in your medication START: Imdur 71mg d49m.  *If you need a refill on your cardiac medications before your next appointment, please call your pharmacy*  Please take your blood pressure daily for 2 weeks and send in a MyChart message. Please include heart rates.   HOW TO TAKE YOUR BLOOD PRESSURE: Rest 5 minutes before taking your blood pressure. Don't smoke  or drink caffeinated beverages for at least 30 minutes before. Take your blood pressure before (not after) you eat. Sit comfortably with your back supported and both feet on the floor (don't cross your legs). Elevate your arm to heart level on a table or a desk. Use the proper sized cuff. It should fit smoothly and snugly around your bare upper arm. There should be enough room to slip a fingertip under the cuff. The bottom edge of the cuff should be 1 inch above the crease of the elbow. Ideally, take 3  measurements at one sitting and record the average.   Lab Work: Your physician recommends that you have the following labs drawn today: BNP and BMET   If you have labs (blood work) drawn today and your tests are completely normal, you will receive your results only by: MyChart Message (if you have MyChart) OR A paper copy in the mail If you have any lab test that is abnormal or we need to change your treatment, we will call you to review the results.   Testing/Procedures: NONE ordered at this time of appointment   Follow-Up: At [facility], you and your health needs are our priority.  As part of our continuing mission to provide you with exceptional heart care, we have created designated Provider Care Teams.  These Care Teams include your primary Cardiologist (physician) and Advanced Practice Providers (APPs -  Physician Assistants and Nurse Practitioners) who all work together to provide you with the care you need, when you need it.  We recommend signing up for the patient portal called "MyChart".  Sign up information is provided on this After Visit Summary.  MyChart is used to connect with patients for Virtual Visits (Telemedicine).  Patients are able to view lab/test results, encounter notes, upcoming appointments, etc.  Non-urgent messages can be sent to your provider as well.   To learn more about what you can do with MyChart, go to [email or web address].    Your next appointment:   2 week(s)  The format for your next appointment:   In Person  Provider:   Sande Rives, PA-C        Signed, Darreld Mclean, PA-C  12/20/2021 12:49 PM    [place]

## 2021-12-20 ENCOUNTER — Ambulatory Visit: Payer: Medicare HMO | Attending: Cardiovascular Disease | Admitting: Student

## 2021-12-20 ENCOUNTER — Encounter: Payer: Self-pay | Admitting: Student

## 2021-12-20 VITALS — BP 172/78 | HR 79 | Ht 72.0 in | Wt 240.0 lb

## 2021-12-20 DIAGNOSIS — M7989 Other specified soft tissue disorders: Secondary | ICD-10-CM | POA: Diagnosis not present

## 2021-12-20 DIAGNOSIS — E118 Type 2 diabetes mellitus with unspecified complications: Secondary | ICD-10-CM

## 2021-12-20 DIAGNOSIS — Z794 Long term (current) use of insulin: Secondary | ICD-10-CM

## 2021-12-20 DIAGNOSIS — I2510 Atherosclerotic heart disease of native coronary artery without angina pectoris: Secondary | ICD-10-CM

## 2021-12-20 DIAGNOSIS — E785 Hyperlipidemia, unspecified: Secondary | ICD-10-CM

## 2021-12-20 DIAGNOSIS — I10 Essential (primary) hypertension: Secondary | ICD-10-CM

## 2021-12-20 DIAGNOSIS — R0602 Shortness of breath: Secondary | ICD-10-CM | POA: Diagnosis not present

## 2021-12-20 DIAGNOSIS — I4729 Other ventricular tachycardia: Secondary | ICD-10-CM | POA: Diagnosis not present

## 2021-12-20 DIAGNOSIS — N184 Chronic kidney disease, stage 4 (severe): Secondary | ICD-10-CM

## 2021-12-20 MED ORDER — ISOSORBIDE MONONITRATE ER 30 MG PO TB24: 30.0000 mg | ORAL_TABLET | Freq: Every day | ORAL | 0 refills | Status: DC

## 2021-12-20 MED ORDER — ISOSORBIDE MONONITRATE ER 30 MG PO TB24: 30.0000 mg | ORAL_TABLET | Freq: Every day | ORAL | 3 refills | Status: DC

## 2021-12-20 NOTE — Patient Instructions (Signed)
Medication Instructions:  Your physician has recommended you make the following change in your medication START: Imdur '30mg'$  daily.  *If you need a refill on your cardiac medications before your next appointment, please call your pharmacy*  Please take your blood pressure daily for 2 weeks and send in a MyChart message. Please include heart rates.   HOW TO TAKE YOUR BLOOD PRESSURE: Rest 5 minutes before taking your blood pressure. Don't smoke or drink caffeinated beverages for at least 30 minutes before. Take your blood pressure before (not after) you eat. Sit comfortably with your back supported and both feet on the floor (don't cross your legs). Elevate your arm to heart level on a table or a desk. Use the proper sized cuff. It should fit smoothly and snugly around your bare upper arm. There should be enough room to slip a fingertip under the cuff. The bottom edge of the cuff should be 1 inch above the crease of the elbow. Ideally, take 3 measurements at one sitting and record the average.   Lab Work: Your physician recommends that you have the following labs drawn today: BNP and BMET   If you have labs (blood work) drawn today and your tests are completely normal, you will receive your results only by: MyChart Message (if you have MyChart) OR A paper copy in the mail If you have any lab test that is abnormal or we need to change your treatment, we will call you to review the results.   Testing/Procedures: NONE ordered at this time of appointment   Follow-Up: At [facility], you and your health needs are our priority.  As part of our continuing mission to provide you with exceptional heart care, we have created designated Provider Care Teams.  These Care Teams include your primary Cardiologist (physician) and Advanced Practice Providers (APPs -  Physician Assistants and Nurse Practitioners) who all work together to provide you with the care you need, when you need it.  We  recommend signing up for the patient portal called "MyChart".  Sign up information is provided on this After Visit Summary.  MyChart is used to connect with patients for Virtual Visits (Telemedicine).  Patients are able to view lab/test results, encounter notes, upcoming appointments, etc.  Non-urgent messages can be sent to your provider as well.   To learn more about what you can do with MyChart, go to [email or web address].    Your next appointment:   2 week(s)  The format for your next appointment:   In Person  Provider:   Sande Rives, PA-C

## 2021-12-21 LAB — BASIC METABOLIC PANEL
BUN/Creatinine Ratio: 11 (ref 10–24)
BUN: 39 mg/dL — ABNORMAL HIGH (ref 8–27)
CO2: 17 mmol/L — ABNORMAL LOW (ref 20–29)
Calcium: 8.8 mg/dL (ref 8.6–10.2)
Chloride: 111 mmol/L — ABNORMAL HIGH (ref 96–106)
Creatinine, Ser: 3.4 mg/dL — ABNORMAL HIGH (ref 0.76–1.27)
Glucose: 70 mg/dL (ref 70–99)
Potassium: 4.9 mmol/L (ref 3.5–5.2)
Sodium: 144 mmol/L (ref 134–144)
eGFR: 17 mL/min/{1.73_m2} — ABNORMAL LOW (ref >59)

## 2021-12-21 LAB — BRAIN NATRIURETIC PEPTIDE: BNP: 269.3 pg/mL — ABNORMAL HIGH (ref 0.0–100.0)

## 2021-12-25 NOTE — Progress Notes (Signed)
Cardiology Office Note:    Date:  01/02/2022   ID:  [name], DOB March 04, 1934, MRN [id number]  PCP:  Robyne Peers, MD  Cardiologist:  Quay Burow, MD  Electrophysiologist:  Thompson Grayer, MD   Referring MD: Robyne Peers, MD   Chief Complaint: follow-up of CHF  History of Present Illness:    [name] is a 88 y.o. male with a history of  CAD s/p DES to LAD in 97/4163, chronic diastolic CHF, non-sustained VT, moderate aortic stenosis, hypertension, hyperlipidemia, type 2 diabetes mellitus on insulin, CKD stage IV, and chronic back pain who is followed by Dr. [name] and presents today for follow-up of CHF.   Patient has a long history of CAD.  He ultimately underwent PTCA and stenting with DES to LAD on 10//2009.  He was readmitted in 03/2008 for atypical chest pain with nonsustained VT and underwent a nuclear stress test which showed no ischemia.  He presented again in 05/2008 with recurrent chest pain and underwent repeat cardiac catheterization which showed widely patent LAD stent and otherwise nonobstructive disease in the LCx and RCA.  He underwent attempt at VT ablation by Dr. Rayann Heman in 05/2008; however, this was aborted because of inappropriate substrate.  Last ischemic evaluation was a nuclear stress test in 04/2011 which showed no evidence of ischemia.  Renal artery ultrasound in 03/2019 showed no evidence of renal artery stenosis. Patient was last seen by Dr. [name] in 02/2021 at which time he reports some dyspnea but denied any chest pain.  He was having some issues with hematuria and prostate cancer as well as profound anemia secondary to untreated hematuria.  Right carotid bruit was noted on exam; therefore, a carotid ultrasound was ordered and was essentially normal with no evidence of stenosis in the right ICA and only 1-39% stenosis of the left ICA.  Repeat echo was also ordered for routine monitoring of his aortic stenosis.  Echo in 09/2021 showed LVEF of 60-65%  with no regional wall motion abnormalities, moderate LVH, and grade 2 diastolic dysfunction, normal RV, biatrial enlargement, and severe calcification of the aortic valve with moderate AS (mean gradient 20 mmHg).  Patient was recently seen by me on 12/20/2021 at which time he reported having dyspnea on exertion and worsening lower extremity edema after starting Hydralazine which was prescribed by his Nephrologist. Symptoms then worsened after dose was increase. Patient stopped taking Hydralazine about 1 week prior to visit and noted improvement in symptoms. Symptoms did seem to coincide with this new medication but he was also felt to likely have a component of diastolic CHF. BNP was elevated at 269 but creatinine was 3.40. Baseline creatinine around 2.5 to 2.7 (although it was 3.75 in 08/2021 and then improved back to 2.75 in 09/2021). Given baseline CKD stage IV, I recommended patient reach out to Nephrologist to see if Lasix needed to be increased. He was advised to remain off Hydralazine and Imdur was started to help with BP. He was advised to follow-up in 2 weeks.  Patient presents today for follow-up. Here alone. Patient is overall doing well. His breathing has returned to baseline off the Hydralazine. He notes some dyspnea on exertion if he is rushing but this is not new. No new or worsening orthopnea. No PND. His lower extremity edema waxes and wanes but is stable. No chest pain or palpitations. He notes occasional feeling a little woozy but no significant lightheadedness/dizziness and no syncope. BP is still mildly elevated in  the office in the 150s/60s. He just started Imdur 3 days ago due to waiting for it to be delivered from mail order pharmacy. He is tolerating it well so far.  Past Medical History:  Diagnosis Date   Allergy    Anemia    CAD (coronary artery disease)    pci to LAD 10/09; stable CAD by cath 05/31/08; myoview 04/11/11- no ishcemia, EF 67%; echo 05/15/09- EF>55%, mod calcification of  the aortic valve leaflets   CAD S/P percutaneous coronary angioplasty 05/08/2008   LAD PCI with DES 2009- Myoview low risk 2013   CHF (congestive heart failure) ([place]) 09/07/2019   Chronic back pain    Chronic kidney disease    Cystitis, radiation 12/01/2013   Dyspnea    ED (erectile dysfunction)    Elevated serum creatinine 12/14/2015   Essential hypertension, benign    Hematuria 12/01/2013   Hyperlipidemia    Ileus ([place]) 12/14/2015   Insulin dependent type 2 diabetes mellitus, controlled ([place])    Malignant neoplasm of prostate ([place]) 11/26/2013   Multiple rib fractures    left 9th, 10th, and 11th posterior rib fractures S/P fall 12/09/2015/notes 12/12/2015   Osteoarthritis of right knee 03/04/2018   Personal history of COVID-19 09/05/2021   PREMATURE VENTRICULAR CONTRACTIONS 05/08/2008   Qualifier: Diagnosis of  By: Rayann Heman, MD, James     Prostate cancer [facility])    s/p   Rectal bleeding 12/23/2012   Evaluated by Dr Benson [facility] patient with history of radiation proctitis. Patient due for colonoscopy on 04/2014    Type II or unspecified type diabetes mellitus without mention of complication, not stated as uncontrolled    Ventricular tachycardia ([place])    resuscitated; monitor 05/2008; attempted T ablation 2/10- aborted due to inappropriate substrate    Past Surgical History:  Procedure Laterality Date   CARDIAC CATHETERIZATION  05/31/08   EF 55-60%, stable lesions, medical therapy   COLECTOMY  '80's   polyps   CORONARY ANGIOPLASTY WITH STENT PLACEMENT  01/19/08   PCI stent to mid LAD with Promus DES 27.5x28   CYSTOSCOPY WITH DIRECT VISION INTERNAL URETHROTOMY [name] 03/12/2021   Procedure: cystoscoopy withdirect vision internal urethrotomy optilum urethral dilation     ;  Surgeon: Lucas Mallow, MD;  Location: [facility];  Service: Urology;  Laterality: [name];  RERQUESTING 45 MINS   IR GENERIC HISTORICAL  01/03/2016   IR THORACENTESIS ASP PLEURAL SPACE W/IMG GUIDE 01/03/2016 [facility]-INTERV RAD   KNEE  ARTHROPLASTY Right 03/04/2018   Procedure: COMPUTER ASSISTED TOTAL KNEE ARTHROPLASTY;  Surgeon: Rod Can, MD;  Location: [facility];  Service: Orthopedics;  Laterality: Right;   PTCA  01/2008   TRANSURETHRAL RESECTION OF BLADDER TUMOR [name] 11/10/2020   Procedure: Silvestre Moment;  Surgeon: Cleon Gustin, MD;  Location: [facility];  Service: Urology;  Laterality: [name];   TRANSURETHRAL RESECTION OF BLADDER TUMOR WITH MITOMYCIN-C [name] 09/17/2021   Procedure: TRANSURETHRAL RESECTION OF BLADDER TUMOR WITH GEMCITABINE;  Surgeon: Lucas Mallow, MD;  Location: [facility];  Service: Urology;  Laterality: [name];    Current Medications: Current Meds  Medication Sig   acetaminophen (TYLENOL) 500 MG tablet Take 1,000 mg by mouth in the morning.   amLODipine (NORVASC) 10 MG tablet TAKE 1 TABLET EVERY DAY (APPOINTMENT IS NEEDED FOR REFILLS)   atorvastatin (LIPITOR) 10 MG tablet Take 1 tablet (10 mg total) by mouth daily.   Blood Glucose Monitoring Suppl (ACCU-CHEK AVIVA PLUS) w/Device KIT Test blood sugar 3 times daily.  Dx code: E11.22   Blood Pressure Monitoring (BLOOD PRESS MONITOR/M-L CUFF) MISC 1 application by Does not apply route daily.   carvedilol (COREG) 25 MG tablet TAKE 1 TABLET TWICE DAILY. KEEP OFFICE VISIT   Cholecalciferol (VITAMIN D-3 PO) Take 1 capsule by mouth daily.   Continuous Blood Gluc Sensor (FREESTYLE LIBRE SENSOR SYSTEM) MISC 1 application by Does not apply route daily.   Continuous Glucose Monitor Sup KIT 1 kit by Does not apply route as directed.   furosemide (LASIX) 40 MG tablet TAKE 1 TABLET TWICE DAILY (NEED OFFICE VISIT FOR FUTURE REFILLS)   glucose blood (ACCU-CHEK AVIVA PLUS) test strip Test blood sugar 3 times daily. Dx code: E11.22   HYDROcodone-acetaminophen (NORCO) 5-325 MG tablet Take 1 tablet by mouth every 4 (four) hours as needed for moderate pain.   insulin NPH-regular Human (NOVOLIN 70/30) (70-30) 100 UNIT/ML injection Inject 11 Units into  the skin 2 (two) times daily with a meal. (Patient taking differently: Inject 0-16 Units into the skin 2 (two) times daily with a meal. Per sliding scale.)   Insulin Syringe-Needle U-100 (B-D INS SYR HALF-UNIT .3CC/31G) 31G X 5/16" 0.3 ML MISC Use daily at bedtime to inject insulin. Dx code: 250.00   isosorbide mononitrate (IMDUR) 30 MG 24 hr tablet Take 1 tablet (30 mg total) by mouth daily.   Lancets (ACCU-CHEK MULTICLIX) lancets Test blood sugar 3 times daily. Dx code: E11.22   Multiple Vitamin (MULTIVITAMIN) tablet Take 1 tablet by mouth daily.   Omega-3 Fatty Acids (FISH OIL PO) Take 1 capsule by mouth daily.   tamsulosin (FLOMAX) 0.4 MG CAPS capsule Take 0.4 mg by mouth daily.     Allergies:   Hydralazine   Social History   Socioeconomic History   Marital status: Married    Spouse name: Not on file   Number of children: 7   Years of education: Not on file   Highest education level: Not on file  Occupational History   Occupation: RETIRED    Employer: RETIRED  Tobacco Use   Smoking status: Former    Packs/day: 0.00    Years: 15.00    Total pack years: 0.00    Types: Cigarettes    Quit date: 05/10/2017    Years since quitting: 4.6   Smokeless tobacco: Never   Tobacco comments:    Patient smokes occasionally. Not everyday.  Vaping Use   Vaping Use: Never used  Substance and Sexual Activity   Alcohol use: No   Drug use: No   Sexual activity: Never  Other Topics Concern   Not on file  Social History Narrative   Not on file   Social Determinants of Health   Financial Resource Strain: Not on file  Food Insecurity: Not on file  Transportation Needs: Not on file  Physical Activity: Not on file  Stress: Not on file  Social Connections: Not on file     Family History: The patient's family history includes Diabetes in his brother and mother; Heart attack in his mother; Heart disease in his brother and sister; Leukemia in his father.  ROS:   Please see the history of  present illness.     EKGs/Labs/Other Studies Reviewed:    The following studies were reviewed today:  Renal Artery Ultrasound 03/11/2019: Summary: Right: Normal size right kidney. No evidence of right renal artery         stenosis. RRV flow present.  Left:  Normal size of left kidney. No evidence of left renal  artery         stenosis. LRV flow present.  _______________   Carotid Ultrasound 02/19/2021: Summary: - Right Carotid: There is no evidence of stenosis in the right ICA. The  extracranial vessels were near-normal with only minimal wall thickening or plaque.  - Left Carotid: Velocities in the left ICA are consistent with a 1-39%  stenosis. The extracranial vessels were near-normal with only minimal  wall thickening or plaque.  -Vertebrals:  Right vertebral artery demonstrates antegrade flow. Left  vertebral artery demonstrates high resistant flow.  - Subclavians: Normal flow hemodynamics were seen in bilateral subclavian arteries.  _______________   Echocardiogram 09/12/2021: Impressions: 1. The aortic valve is tricuspid. There is severe calcifcation of the  aortic valve. There is severe thickening of the aortic valve. Aortic valve  regurgitation is mild. Moderate aortic valve stenosis. Aortic valve area,  by VTI measures 1.12 cm. Aortic  valve mean gradient measures 20.0 mmHg. Aortic valve Vmax measures 3.06  m/s.   2. Left ventricular ejection fraction, by estimation, is 60 to 65%. The  left ventricle has normal function. The left ventricle has no regional  wall motion abnormalities. There is moderate concentric left ventricular  hypertrophy. Left ventricular  diastolic parameters are consistent with Grade II diastolic dysfunction  (pseudonormalization).   3. Right ventricular systolic function is normal. The right ventricular  size is normal.   4. Left atrial size was severely dilated.   5. Right atrial size was mildly dilated.   6. The mitral valve is grossly  normal. Trivial mitral valve  regurgitation. No evidence of mitral stenosis.   Comparison(s): Changes from prior study are noted. AS is now moderate.  EKG:  EKG not ordered today.   Recent Labs: 02/07/2021: ALT 12 08/28/2021: Hemoglobin 12.4; Platelets 241 12/20/2021: BNP 269.3; BUN 39; Creatinine, Ser 3.40; Potassium 4.9; Sodium 144  Recent Lipid Panel    Component Value Date/Time   CHOL 125 02/07/2021 0920   TRIG 111 02/07/2021 0920   HDL 38 (L) 02/07/2021 0920   CHOLHDL 3.3 02/07/2021 0920   CHOLHDL 2.8 08/30/2015 0805   VLDL 20 08/30/2015 0805   LDLCALC 67 02/07/2021 0920    Physical Exam:    Vital Signs: BP (!) 152/68 (BP Location: Left Arm, Patient Position: Sitting)   Pulse 80   Ht 6' (1.829 m)   Wt 241 lb (109.3 kg)   SpO2 97%   BMI 32.69 kg/m     Wt Readings from Last 3 Encounters:  01/02/22 241 lb (109.3 kg)  12/20/21 240 lb (108.9 kg)  09/17/21 229 lb 9.6 oz (104.1 kg)     General: 88 y.o. male in no acute distress. HEENT: Normocephalic and atraumatic. Sclera clear.  Neck: Supple. No JVD. Heart: RRR. Distinct S1 and S2. II/VI systolic murmur. No gallops or rubs. Radial pulses 2+ and equal bilaterally. Lungs: No increased work of breathing. Clear to ausculation bilaterally. No wheezes, rhonchi, or rales.  Abdomen: Soft, non-distended, and non-tender to palpation.  Extremities: 1+ pitting edema of bilateral lower extremities. Skin: Warm and dry. Neuro: Alert and oriented x3. No focal deficits. Psych: Normal affect. Responds appropriately.  Assessment:    1. Chronic diastolic CHF (congestive heart failure) ([place])   2. Coronary artery disease involving native coronary artery of native heart without angina pectoris   3. Aortic valve stenosis, etiology of cardiac valve disease unspecified   4. NSVT (nonsustained ventricular tachycardia) ([place])   5. Primary hypertension   6. Hyperlipidemia, unspecified hyperlipidemia type  7. Type 2 diabetes mellitus with  complication, with long-term current use of insulin ([place])   8. Chronic kidney disease (CKD), stage IV (severe) ([facility])     Plan:    Chronic Diastolic CHF Recent echo in 09/2021 showed 60-65% with no regional wall motion abnormalities, moderate LVH, and grade 2 diastolic dysfunction as well as moderate aortic stenosis. Patient was seen in the office a couple of weeks ago and reported dyspnea on exertion and worsening lower extremity edema. This seemed to coincide with starting Hydralazine. He had stopped taking this and noticed improvement in symptoms. However, he was also felt to have a degree of diastolic CHF. BNP was mildly elevated at 269 and creatinine was 3.4 so he was advised to follow-up with his Nephrologist in regards to adjustment of Lasix.  - Does not appear grossly volume overloaded on exam. - Nephrologist reportedly decreased Lasix to 87m in the morning and 244min the evening. OK to continue this dose. - Remain off Hydralazine (will list this as an allergy). - Discussed importance of daily weights and sodium/fluid restrictions.    CAD History of DES to LAD in 2009.  Last ischemic evaluation was a Myoview in 2013 which showed no evidence of ischemia. -No chest pain. -No longer on aspirin due to hematuria/anemia.  -Continue statin.   Moderate Aortic Stenosis Noted on Echo in 09/2021. - Plan is for repeat Echo in 09/2022.    Non-Sustained VT Remote history of nonsustained VT.  A VT ablation was attempted in 2010 by Dr. AlRayann Hemanut this was aborted due to an appropriate substrate. - No recent issues.  - Continue Coreg 25 mg twice daily.   Hypertension BP mildly elevated. Initially 150/60 and then 152/68 on my personal recheck at the end of visit. - Continue Amlodipine 1036maily and Coreg 19m47mice daily. - Will increase Imdur to 60mg93mly. - Previously on Lisinopril but his Nephrologist stopped this given CKD stage IV. - Previously on Hydralazine but was unable to tolerate  this. - Asked patient to keep BP/HR log for 2 weeks and then send this to us.  [place]lipidemia Lipid panel in 09/2021: Total Cholesterol 148, Triglycerides 134, HDL 41, LDL 80. - Continue Lipitor 10mg 54my. - Ideally would be on at least 40mg o53mpitor given underlying CAD but given advanced age, I do not know if there is much utility in being overly aggressive with this.  Okay to continue current dose.   Type 2 Diabetes Mellitus Hemoglobin A1c 7.2% in 08/2021. -On Insulin. -Management per PCP.   CKD Stage IV Recent baseline creatinine 2.5 to 2.7. However, creatinine was 3.4 at last office visit on 12/20/2021. - Followed by Nephrology (Dr. Foster)Royce Macadamia fax most recent labs to them.   Disposition: Follow up in 3 months.    Medication Adjustments/Labs and Tests Ordered: Current medicines are reviewed at length with the patient today.  Concerns regarding medicines are outlined above.  No orders of the defined types were placed in this encounter.  No orders of the defined types were placed in this encounter.   Patient Instructions  Medication Instructions:  Increase Imdur 60 mg daily   *If you need a refill on your cardiac medications before your next appointment, please call your pharmacy*   Lab Work: NONE ordered at this time of appointment   If you have labs (blood work) drawn today and your tests are completely normal, you will receive your results only by: [facility] have MyChart)  OR A paper copy in the mail If you have any lab test that is abnormal or we need to change your treatment, we will call you to review the results.   Testing/Procedures: NONE ordered at this time of appointment     Follow-Up: At [facility], you and your health needs are our priority.  As part of our continuing mission to provide you with exceptional heart care, we have created designated Provider Care Teams.  These Care Teams include your primary Cardiologist  (physician) and Advanced Practice Providers (APPs -  Physician Assistants and Nurse Practitioners) who all work together to provide you with the care you need, when you need it.  We recommend signing up for the patient portal called "MyChart".  Sign up information is provided on this After Visit Summary.  MyChart is used to connect with patients for Virtual Visits (Telemedicine).  Patients are able to view lab/test results, encounter notes, upcoming appointments, etc.  Non-urgent messages can be sent to your provider as well.   To learn more about what you can do with MyChart, go to [email or web address].    Your next appointment:   3 month(s)  The format for your next appointment:   In Person  Provider:   Quay Burow, MD  or Sande Rives, PA-C        Other Instructions Monitor Blood Pressure. Please take blood pressure 2 hours after taking medication.   Important Information About Sugar         Signed, Eppie Gibson  01/02/2022 1:37 PM    [facility]

## 2022-01-02 ENCOUNTER — Ambulatory Visit: Payer: Medicare HMO | Attending: Student | Admitting: Student

## 2022-01-02 ENCOUNTER — Encounter: Payer: Self-pay | Admitting: Student

## 2022-01-02 VITALS — BP 152/68 | HR 80 | Ht 72.0 in | Wt 241.0 lb

## 2022-01-02 DIAGNOSIS — Z794 Long term (current) use of insulin: Secondary | ICD-10-CM

## 2022-01-02 DIAGNOSIS — E785 Hyperlipidemia, unspecified: Secondary | ICD-10-CM

## 2022-01-02 DIAGNOSIS — I472 Ventricular tachycardia, unspecified: Secondary | ICD-10-CM

## 2022-01-02 DIAGNOSIS — I10 Essential (primary) hypertension: Secondary | ICD-10-CM

## 2022-01-02 DIAGNOSIS — I2510 Atherosclerotic heart disease of native coronary artery without angina pectoris: Secondary | ICD-10-CM | POA: Diagnosis not present

## 2022-01-02 DIAGNOSIS — R0609 Other forms of dyspnea: Secondary | ICD-10-CM

## 2022-01-02 DIAGNOSIS — I4729 Other ventricular tachycardia: Secondary | ICD-10-CM

## 2022-01-02 DIAGNOSIS — N184 Chronic kidney disease, stage 4 (severe): Secondary | ICD-10-CM

## 2022-01-02 DIAGNOSIS — I5032 Chronic diastolic (congestive) heart failure: Secondary | ICD-10-CM

## 2022-01-02 DIAGNOSIS — I350 Nonrheumatic aortic (valve) stenosis: Secondary | ICD-10-CM | POA: Diagnosis not present

## 2022-01-02 DIAGNOSIS — E118 Type 2 diabetes mellitus with unspecified complications: Secondary | ICD-10-CM

## 2022-01-02 MED ORDER — FUROSEMIDE 40 MG PO TABS: ORAL_TABLET | ORAL | Status: DC

## 2022-01-02 NOTE — Patient Instructions (Addendum)
Medication Instructions:  Increase Imdur 60 mg daily   *If you need a refill on your cardiac medications before your next appointment, please call your pharmacy*   Lab Work: NONE ordered at this time of appointment   If you have labs (blood work) drawn today and your tests are completely normal, you will receive your results only by: [place] (if you have MyChart) OR A paper copy in the mail If you have any lab test that is abnormal or we need to change your treatment, we will call you to review the results.   Testing/Procedures: NONE ordered at this time of appointment     Follow-Up: At [facility], you and your health needs are our priority.  As part of our continuing mission to provide you with exceptional heart care, we have created designated Provider Care Teams.  These Care Teams include your primary Cardiologist (physician) and Advanced Practice Providers (APPs -  Physician Assistants and Nurse Practitioners) who all work together to provide you with the care you need, when you need it.  We recommend signing up for the patient portal called "MyChart".  Sign up information is provided on this After Visit Summary.  MyChart is used to connect with patients for Virtual Visits (Telemedicine).  Patients are able to view lab/test results, encounter notes, upcoming appointments, etc.  Non-urgent messages can be sent to your provider as well.   To learn more about what you can do with MyChart, go to [email or web address].    Your next appointment:   3 month(s)  The format for your next appointment:   In Person  Provider:   Quay Burow, MD  or Sande Rives, PA-C        Other Instructions Monitor Blood Pressure. Please take blood pressure 2 hours after taking medication.   Important Information About Sugar

## 2022-01-12 ENCOUNTER — Other Ambulatory Visit: Payer: Self-pay | Admitting: Cardiovascular Disease

## 2022-02-04 MED ORDER — ISOSORBIDE MONONITRATE ER 60 MG PO TB24: 120.0000 mg | ORAL_TABLET | Freq: Every day | ORAL | 3 refills | Status: DC

## 2022-02-04 NOTE — Telephone Encounter (Signed)
Daughter stated patient has not been taking Imdur '120mg'$  since it was ordered on 01/28/22. Patient eats processed meats and drinks coffee all day. BP today 161/73, P 69. Recommended for patient to take a '60mg'$  tablet of Imdur now and to take the prescribed does of '120mg'$  in the morning. Patient is to monitor BP 1-2 hours after taking Imdur and keep a diary of BP. Daughter verbalized understanding. Prescription sent to preferred pharmacy.

## 2022-02-09 ENCOUNTER — Other Ambulatory Visit: Payer: Self-pay | Admitting: Cardiovascular Disease

## 2022-02-09 DIAGNOSIS — R0609 Other forms of dyspnea: Secondary | ICD-10-CM

## 2022-02-09 DIAGNOSIS — I2510 Atherosclerotic heart disease of native coronary artery without angina pectoris: Secondary | ICD-10-CM

## 2022-02-09 DIAGNOSIS — I4729 Other ventricular tachycardia: Secondary | ICD-10-CM

## 2022-02-09 DIAGNOSIS — I10 Essential (primary) hypertension: Secondary | ICD-10-CM

## 2022-02-13 ENCOUNTER — Ambulatory Visit: Payer: Medicare HMO | Admitting: Cardiovascular Disease

## 2022-04-16 ENCOUNTER — Ambulatory Visit: Payer: Medicare HMO | Attending: Cardiovascular Disease | Admitting: Cardiovascular Disease

## 2022-04-16 ENCOUNTER — Encounter: Payer: Self-pay | Admitting: Cardiovascular Disease

## 2022-04-16 VITALS — BP 154/62 | HR 86 | Ht 72.0 in | Wt 230.8 lb

## 2022-04-16 DIAGNOSIS — Z8679 Personal history of other diseases of the circulatory system: Secondary | ICD-10-CM

## 2022-04-16 DIAGNOSIS — Z9861 Coronary angioplasty status: Secondary | ICD-10-CM

## 2022-04-16 DIAGNOSIS — I350 Nonrheumatic aortic (valve) stenosis: Secondary | ICD-10-CM

## 2022-04-16 DIAGNOSIS — I359 Nonrheumatic aortic valve disorder, unspecified: Secondary | ICD-10-CM

## 2022-04-16 DIAGNOSIS — I5032 Chronic diastolic (congestive) heart failure: Secondary | ICD-10-CM

## 2022-04-16 DIAGNOSIS — E785 Hyperlipidemia, unspecified: Secondary | ICD-10-CM

## 2022-04-16 DIAGNOSIS — I2510 Atherosclerotic heart disease of native coronary artery without angina pectoris: Secondary | ICD-10-CM

## 2022-04-16 DIAGNOSIS — I10 Essential (primary) hypertension: Secondary | ICD-10-CM

## 2022-04-16 NOTE — Assessment & Plan Note (Signed)
History of CAD status post LAD stenting by myself using a Promus drug-eluting stent October 2009.  He had a negative Myoview 04/11/2011.  He denies chest pain.

## 2022-04-16 NOTE — Assessment & Plan Note (Signed)
History of essential hypertension blood pressure measured today at 154/62.  He states his blood pressure at home usually runs in the 130-140 range.  He is on amlodipine and carvedilol.  Apparently he was started on hydralazine in the past which resulted in heart failure symptoms which resolved with discontinuation.

## 2022-04-16 NOTE — Patient Instructions (Signed)
Medication Instructions:  Your physician recommends that you continue on your current medications as directed. Please refer to the Current Medication list given to you today.  *If you need a refill on your cardiac medications before your next appointment, please call your pharmacy*    Testing/Procedures: Your physician has requested that you have an echocardiogram. Echocardiography is a painless test that uses sound waves to create images of your heart. It provides your doctor with information about the size and shape of your heart and how well your heart's chambers and valves are working. This procedure takes approximately one hour. There are no restrictions for this procedure. Please do NOT wear cologne, perfume, aftershave, or lotions (deodorant is allowed). Please arrive 15 minutes prior to your appointment time. To be done in June 2024. This procedure will be done at [address]    Follow-Up: At [facility], you and your health needs are our priority.  As part of our continuing mission to provide you with exceptional heart care, we have created designated Provider Care Teams.  These Care Teams include your primary Cardiologist (physician) and Advanced Practice Providers (APPs -  Physician Assistants and Nurse Practitioners) who all work together to provide you with the care you need, when you need it.  We recommend signing up for the patient portal called "MyChart".  Sign up information is provided on this After Visit Summary.  MyChart is used to connect with patients for Virtual Visits (Telemedicine).  Patients are able to view lab/test results, encounter notes, upcoming appointments, etc.  Non-urgent messages can be sent to your provider as well.   To learn more about what you can do with MyChart, go to [email or web address].    Your next appointment:   12 month(s)  The format for your next appointment:   In Person  Provider:   Quay Burow, MD

## 2022-04-16 NOTE — Assessment & Plan Note (Signed)
History of dyslipidemia on statin therapy with lipid profile performed 02/07/2021 revealing total cholesterol 125, LDL 67 HDL 38.

## 2022-04-16 NOTE — Assessment & Plan Note (Signed)
History of ventricular tachycardia status post unsuccessful VT ablation by Dr. Thompson Grayer February 2010.  Was aborted because of inappropriate substrate.  He is set no recurrence since.

## 2022-04-16 NOTE — Assessment & Plan Note (Signed)
History of moderate aortic stenosis with 2D echo performed 09/12/2021 revealing normal LV systolic function with an aortic valve area of 1.12 cm, peak gradient of 37 mmHg.  This represents some progression of disease although he is completely asymptomatic.  This will be rechecked on an annual basis.

## 2022-04-16 NOTE — Progress Notes (Signed)
04/16/2022 [name]   09/02/1933  [id number]  Primary Physician Robyne Peers, MD Primary Cardiologist: Lorretta Harp MD FACP, [place], [place], [place]  HPI:  [name] is a 89 y.o.  moderately overweight, recently remarried for the third time (09/30/14) [place] American male, father of 3 children and 60 stepchildren, grandfather to 104 grandchildren, who is formerly a patient of Dr. Francine Graven.  I last saw him in the office 02/07/2021. He has a history of CAD status post LAD stenting with a Promus drug-eluting stent, October of 2009. He had attempt at VT ablation by Dr. Thompson Grayer, February 2010; however, this was aborted because of inappropriate substrate. His other problems include hypertension, hyperlipidemia, diabetes, as well as history of prostate cancer. Since I saw him, he has been asymptomatic. His last Myoview performed 04/11/11 was completely normal.    Since I saw him in the office over a year ago he continues to do well.  He denies chest pain or shortness of breath.  His most recent 2D echocardiogram performed 09/12/2021 did show normal LV systolic function with moderate aortic stenosis which represents some progression.  His aortic valve area is 1.162 cm with a peak gradient of 37 mmHg.   Current Meds  Medication Sig   acetaminophen (TYLENOL) 500 MG tablet Take 1,000 mg by mouth in the morning.   amLODipine (NORVASC) 10 MG tablet TAKE 1 TABLET EVERY DAY (APPOINTMENT IS NEEDED FOR REFILLS)   atorvastatin (LIPITOR) 10 MG tablet Take 1 tablet (10 mg total) by mouth daily.   Blood Glucose Monitoring Suppl (ACCU-CHEK AVIVA PLUS) w/Device KIT Test blood sugar 3 times daily. Dx code: E11.22   Blood Pressure Monitoring (BLOOD PRESS MONITOR/M-L CUFF) MISC 1 application by Does not apply route daily.   carvedilol (COREG) 25 MG tablet TAKE 1 TABLET TWICE DAILY. KEEP OFFICE VISIT   Cholecalciferol (VITAMIN D-3 PO) Take 1 capsule by mouth daily.   Continuous Blood Gluc Sensor  (FREESTYLE LIBRE SENSOR SYSTEM) MISC 1 application by Does not apply route daily.   Continuous Glucose Monitor Sup KIT 1 kit by Does not apply route as directed.   furosemide (LASIX) 40 MG tablet TAKE 1 TABLET TWICE DAILY   glucose blood (ACCU-CHEK AVIVA PLUS) test strip Test blood sugar 3 times daily. Dx code: E11.22   HYDROcodone-acetaminophen (NORCO) 5-325 MG tablet Take 1 tablet by mouth every 4 (four) hours as needed for moderate pain.   insulin NPH-regular Human (NOVOLIN 70/30) (70-30) 100 UNIT/ML injection Inject 11 Units into the skin 2 (two) times daily with a meal. (Patient taking differently: Inject 0-16 Units into the skin 2 (two) times daily with a meal. Per sliding scale.)   Insulin Syringe-Needle U-100 (B-D INS SYR HALF-UNIT .3CC/31G) 31G X 5/16" 0.3 ML MISC Use daily at bedtime to inject insulin. Dx code: 250.00   isosorbide mononitrate (IMDUR) 60 MG 24 hr tablet Take 2 tablets (120 mg total) by mouth daily.   Lancets (ACCU-CHEK MULTICLIX) lancets Test blood sugar 3 times daily. Dx code: E11.22   Multiple Vitamin (MULTIVITAMIN) tablet Take 1 tablet by mouth daily.   Omega-3 Fatty Acids (FISH OIL PO) Take 1 capsule by mouth daily.   tamsulosin (FLOMAX) 0.4 MG CAPS capsule Take 0.4 mg by mouth daily.     Allergies  Allergen Reactions   Hydralazine     Shortness of breath and lower extremity edema    Social History   Socioeconomic History   Marital status: Married  Spouse name: Not on file   Number of children: 7   Years of education: Not on file   Highest education level: Not on file  Occupational History   Occupation: RETIRED    Employer: RETIRED  Tobacco Use   Smoking status: Former    Packs/day: 0.00    Years: 15.00    Total pack years: 0.00    Types: Cigarettes    Quit date: 05/10/2017    Years since quitting: 4.9   Smokeless tobacco: Never   Tobacco comments:    Patient smokes occasionally. Not everyday.  Vaping Use   Vaping Use: Never used  Substance  and Sexual Activity   Alcohol use: No   Drug use: No   Sexual activity: Never  Other Topics Concern   Not on file  Social History Narrative   Not on file   Social Determinants of Health   Financial Resource Strain: Not on file  Food Insecurity: Not on file  Transportation Needs: Not on file  Physical Activity: Not on file  Stress: Not on file  Social Connections: Not on file  Intimate Partner Violence: Not on file     Review of Systems: General: negative for chills, fever, night sweats or weight changes.  Cardiovascular: negative for chest pain, dyspnea on exertion, edema, orthopnea, palpitations, paroxysmal nocturnal dyspnea or shortness of breath Dermatological: negative for rash Respiratory: negative for cough or wheezing Urologic: negative for hematuria Abdominal: negative for nausea, vomiting, diarrhea, bright red blood per rectum, melena, or hematemesis Neurologic: negative for visual changes, syncope, or dizziness All other systems reviewed and are otherwise negative except as noted above.    Blood pressure (!) 154/62, pulse 86, height 6' (1.829 m), weight 230 lb 12.8 oz (104.7 kg), SpO2 96 %.  General appearance: alert and no distress Neck: no adenopathy, no carotid bruit, no JVD, supple, symmetrical, trachea midline, and thyroid not enlarged, symmetric, no tenderness/mass/nodules Lungs: clear to auscultation bilaterally Heart: Soft outflow tract murmur consistent with aortic stenosis. Extremities: extremities normal, atraumatic, no cyanosis or edema Pulses: 2+ and symmetric Skin: Skin color, texture, turgor normal. No rashes or lesions Neurologic: Grossly normal  EKG not performed today  ASSESSMENT AND PLAN:   CAD S/P percutaneous coronary angioplasty History of CAD status post LAD stenting by myself using a Promus drug-eluting stent October 2009.  He had a negative Myoview 04/11/2011.  He denies chest pain.  History of ventricular tachycardia History of  ventricular tachycardia status post unsuccessful VT ablation by Dr. Thompson Grayer February 2010.  Was aborted because of inappropriate substrate.  He is set no recurrence since.  Essential hypertension History of essential hypertension blood pressure measured today at 154/62.  He states his blood pressure at home usually runs in the 130-140 range.  He is on amlodipine and carvedilol.  Apparently he was started on hydralazine in the past which resulted in heart failure symptoms which resolved with discontinuation.  Dyslipidemia, goal LDL below 70 History of dyslipidemia on statin therapy with lipid profile performed 02/07/2021 revealing total cholesterol 125, LDL 67 HDL 38.  Aortic valve disease History of moderate aortic stenosis with 2D echo performed 09/12/2021 revealing normal LV systolic function with an aortic valve area of 1.12 cm, peak gradient of 37 mmHg.  This represents some progression of disease although he is completely asymptomatic.  This will be rechecked on an annual basis.     Lorretta Harp MD FACP,FACC,FAHA, [facility] 04/16/2022 8:47 AM

## 2022-05-04 ENCOUNTER — Other Ambulatory Visit: Payer: Self-pay | Admitting: Cardiovascular Disease

## 2022-05-04 DIAGNOSIS — E785 Hyperlipidemia, unspecified: Secondary | ICD-10-CM

## 2022-06-26 ENCOUNTER — Other Ambulatory Visit: Payer: Self-pay | Admitting: Urology

## 2022-07-16 NOTE — Progress Notes (Signed)
Anesthesia Review:  PCP: rafaela Aguilar LOV 05/29/22  Cardiologist :DR Jeri Cos- LOV 04/16/22  Kidney- DR Malen Gauze  Chest x-ray : EKG : 12/20/21  Echo : 09/12/21  Carotids- 02/20/21  Stress test: Cardiac Cath :  Activity level: can do a flight of stairs without difficutly  Sleep Study/ CPAP : none  Fasting Blood Sugar :      / Checks Blood Sugar -- times a day:   Blood Thinner/ Instructions /Last Dose: ASA / Instructions/ Last Dose :    DM- type 2  Freestyle Libre    Hgba1c- 05/29/22- 6.4   PT wears Depends due to incontinence.  CBC  and BMP done 07/17/22 with hgb of 10.6 routed to DR Modena Slater.   Shanda Bumps [facility] aware that was routed and BMP and CBC results.

## 2022-07-16 NOTE — Patient Instructions (Signed)
SURGICAL WAITING ROOM VISITATION  Patients having surgery or a procedure may have no more than 2 support people in the waiting area - these visitors may rotate.    Children under the age of 13 must have an adult with them who is not the patient.  Due to an increase in RSV and influenza rates and associated hospitalizations, children ages 15 and under may not visit patients in [facility] hospitals.  If the patient needs to stay at the hospital during part of their recovery, the visitor guidelines for inpatient rooms apply. Pre-op nurse will coordinate an appropriate time for 1 support person to accompany patient in pre-op.  This support person may not rotate.    Please refer to the [facility] website for the visitor guidelines for Inpatients (after your surgery is over and you are in a regular room).       Your procedure is scheduled on:  07/22/22   Report to [facility] Main Entrance    Report to admitting at   [phone]    IF you have   have problems the morning of surgery call  [phone]   Do not eat food  or drink liquids :After Midnight.               If you have questions, please contact your surgeon's office.      Oral Hygiene is also important to reduce your risk of infection.                                    Remember - BRUSH YOUR TEETH THE MORNING OF SURGERY WITH YOUR REGULAR TOOTHPASTE  DENTURES WILL BE REMOVED PRIOR TO SURGERY PLEASE DO NOT APPLY "Poly grip" OR ADHESIVES!!!   Do NOT smoke after Midnight   Take these medicines the morning of surgery with A SIP OF WATER:  amlodipine, coreg, hydralazine, imdur, tmasulosin            NOvolin 70/30/ Insulin-   DO NOT TAKE ANY ORAL DIABETIC MEDICATIONS DAY OF YOUR SURGERY  Bring CPAP mask and tubing day of surgery.                              You may not have any metal on your body including hair pins, jewelry, and body piercing             Do not wear make-up, lotions, powders, perfumes/cologne, or  deodorant  Do not wear nail polish including gel and S&S, artificial/acrylic nails, or any other type of covering on natural nails including finger and toenails. If you have artificial nails, gel coating, etc. that needs to be removed by a nail salon please have this removed prior to surgery or surgery may need to be canceled/ delayed if the surgeon/ anesthesia feels like they are unable to be safely monitored.   Do not shave  48 hours prior to surgery.               Men may shave face and neck.   Do not bring valuables to the hospital. [facility] IS NOT             RESPONSIBLE   FOR VALUABLES.   Contacts, glasses, dentures or bridgework may not be worn into surgery.   Bring small overnight bag day of surgery.   DO NOT [facility]  MEDICATIONS TO THE HOSPITAL. PHARMACY WILL DISPENSE MEDICATIONS LISTED ON YOUR MEDICATION LIST TO YOU DURING YOUR ADMISSION IN THE HOSPITAL!    Patients discharged on the day of surgery will not be allowed to drive home.  Someone NEEDS to stay with you for the first 24 hours after anesthesia.   Special Instructions: Bring a copy of your healthcare power of attorney and living will documents the day of surgery if you haven't scanned them before.              Please read over the following fact sheets you were given: IF YOU HAVE QUESTIONS ABOUT YOUR PRE-OP INSTRUCTIONS PLEASE CALL [phone]   If you received a COVID test during your pre-op visit  it is requested that you wear a mask when out in public, stay away from anyone that may not be feeling well and notify your surgeon if you develop symptoms. If you test positive for Covid or have been in contact with anyone that has tested positive in the last 10 days please notify you surgeon.    [facility] - Preparing for Surgery Before surgery, you can play an important role.  Because skin is not sterile, your skin needs to be as free of germs as possible.  You can reduce the number of germs on your skin by  washing with CHG (chlorahexidine gluconate) soap before surgery.  CHG is an antiseptic cleaner which kills germs and bonds with the skin to continue killing germs even after washing. Please DO NOT use if you have an allergy to CHG or antibacterial soaps.  If your skin becomes reddened/irritated stop using the CHG and inform your nurse when you arrive at Short Stay. Do not shave (including legs and underarms) for at least 48 hours prior to the first CHG shower.  You may shave your face/neck. Please follow these instructions carefully:  1.  Shower with CHG Soap the night before surgery and the  morning of Surgery.  2.  If you choose to wash your hair, wash your hair first as usual with your  normal  shampoo.  3.  After you shampoo, rinse your hair and body thoroughly to remove the  shampoo.                           4.  Use CHG as you would any other liquid soap.  You can apply chg directly  to the skin and wash                       Gently with a scrungie or clean washcloth.  5.  Apply the CHG Soap to your body ONLY FROM THE NECK DOWN.   Do not use on face/ open                           Wound or open sores. Avoid contact with eyes, ears mouth and genitals (private parts).                       Wash face,  Genitals (private parts) with your normal soap.             6.  Wash thoroughly, paying special attention to the area where your surgery  will be performed.  7.  Thoroughly rinse your body with warm water from the neck down.  8.  DO NOT shower/wash with  your normal soap after using and rinsing off  the CHG Soap.                9.  Pat yourself dry with a clean towel.            10.  Wear clean pajamas.            11.  Place clean sheets on your bed the night of your first shower and do not  sleep with pets. Day of Surgery : Do not apply any lotions/deodorants the morning of surgery.  Please wear clean clothes to the hospital/surgery center.  FAILURE TO FOLLOW THESE INSTRUCTIONS MAY RESULT IN THE  CANCELLATION OF YOUR SURGERY PATIENT SIGNATURE_________________________________  NURSE SIGNATURE__________________________________  ________________________________________________________________________

## 2022-07-17 ENCOUNTER — Encounter (HOSPITAL_COMMUNITY)
Admission: RE | Admit: 2022-07-17 | Discharge: 2022-07-17 | Disposition: A | Discharge status: Home / Self Care | Payer: Medicare HMO | Source: Ambulatory Visit | Attending: Urology | Admitting: Urology

## 2022-07-17 ENCOUNTER — Encounter (HOSPITAL_COMMUNITY): Payer: Self-pay

## 2022-07-17 ENCOUNTER — Other Ambulatory Visit: Payer: Self-pay

## 2022-07-17 VITALS — BP 158/73 | HR 68 | Temp 98.3°F | Resp 16 | Ht 72.0 in | Wt 229.0 lb

## 2022-07-17 DIAGNOSIS — Z794 Long term (current) use of insulin: Secondary | ICD-10-CM | POA: Diagnosis not present

## 2022-07-17 DIAGNOSIS — I130 Hypertensive heart and chronic kidney disease with heart failure and stage 1 through stage 4 chronic kidney disease, or unspecified chronic kidney disease: Secondary | ICD-10-CM | POA: Insufficient documentation

## 2022-07-17 DIAGNOSIS — E1122 Type 2 diabetes mellitus with diabetic chronic kidney disease: Secondary | ICD-10-CM | POA: Diagnosis not present

## 2022-07-17 DIAGNOSIS — Z955 Presence of coronary angioplasty implant and graft: Secondary | ICD-10-CM | POA: Insufficient documentation

## 2022-07-17 DIAGNOSIS — I2510 Atherosclerotic heart disease of native coronary artery without angina pectoris: Secondary | ICD-10-CM | POA: Insufficient documentation

## 2022-07-17 DIAGNOSIS — N184 Chronic kidney disease, stage 4 (severe): Secondary | ICD-10-CM | POA: Insufficient documentation

## 2022-07-17 DIAGNOSIS — I509 Heart failure, unspecified: Secondary | ICD-10-CM | POA: Insufficient documentation

## 2022-07-17 DIAGNOSIS — Z01818 Encounter for other preprocedural examination: Secondary | ICD-10-CM

## 2022-07-17 DIAGNOSIS — Z8546 Personal history of malignant neoplasm of prostate: Secondary | ICD-10-CM | POA: Diagnosis not present

## 2022-07-17 DIAGNOSIS — Z01812 Encounter for preprocedural laboratory examination: Secondary | ICD-10-CM | POA: Diagnosis present

## 2022-07-17 DIAGNOSIS — D494 Neoplasm of unspecified behavior of bladder: Secondary | ICD-10-CM | POA: Diagnosis not present

## 2022-07-17 DIAGNOSIS — Z87891 Personal history of nicotine dependence: Secondary | ICD-10-CM | POA: Diagnosis not present

## 2022-07-17 LAB — CBC
HCT: 34.3 % — ABNORMAL LOW (ref 39.0–52.0)
Hemoglobin: 10.6 g/dL — ABNORMAL LOW (ref 13.0–17.0)
MCH: 26.1 pg (ref 26.0–34.0)
MCHC: 30.9 g/dL (ref 30.0–36.0)
MCV: 84.5 fL (ref 80.0–100.0)
Platelets: 235 10*3/uL (ref 150–400)
RBC: 4.06 MIL/uL — ABNORMAL LOW (ref 4.22–5.81)
RDW: 16.7 % — ABNORMAL HIGH (ref 11.5–15.5)
WBC: 7.5 10*3/uL (ref 4.0–10.5)
nRBC: 0 % (ref 0.0–0.2)

## 2022-07-17 LAB — BASIC METABOLIC PANEL
Anion gap: 10 (ref 5–15)
BUN: 54 mg/dL — ABNORMAL HIGH (ref 8–23)
CO2: 19 mmol/L — ABNORMAL LOW (ref 22–32)
Calcium: 8.5 mg/dL — ABNORMAL LOW (ref 8.9–10.3)
Chloride: 112 mmol/L — ABNORMAL HIGH (ref 98–111)
Creatinine, Ser: 4.84 mg/dL — ABNORMAL HIGH (ref 0.61–1.24)
GFR, Estimated: 11 mL/min — ABNORMAL LOW (ref >60)
Glucose, Bld: 118 mg/dL — ABNORMAL HIGH (ref 70–99)
Potassium: 4.3 mmol/L (ref 3.5–5.1)
Sodium: 141 mmol/L (ref 135–145)

## 2022-07-17 LAB — GLUCOSE, CAPILLARY: Glucose-Capillary: 101 mg/dL — ABNORMAL HIGH (ref 70–99)

## 2022-07-18 ENCOUNTER — Ambulatory Visit: Payer: Medicare HMO | Attending: Student | Admitting: Student

## 2022-07-18 ENCOUNTER — Telehealth: Payer: Self-pay | Admitting: *Deleted

## 2022-07-18 ENCOUNTER — Telehealth: Payer: Self-pay | Admitting: Cardiovascular Disease

## 2022-07-18 DIAGNOSIS — Z01810 Encounter for preprocedural cardiovascular examination: Secondary | ICD-10-CM

## 2022-07-18 NOTE — Telephone Encounter (Signed)
   Name: [name]  DOB: [birth year]  MRN: [id number]  Primary Cardiologist: Nanetta Batty, MD   Preoperative team, please contact this patient and set up a phone call appointment for further preoperative risk assessment. Please obtain consent and complete medication review. Thank you for your help.  I confirm that guidance regarding antiplatelet and oral anticoagulation therapy has been completed and, if necessary, noted below.  Will need to determine if patient is taking aspirin, as it is not listed on medications but he has a history of CAD with PCI in the past.    Carlos Levering, NP 07/18/2022, 12:22 PM [facility]

## 2022-07-18 NOTE — Progress Notes (Signed)
Virtual Visit via Telephone Note   Because of [name]'s co-morbid illnesses, he is at least at moderate risk for complications without adequate follow up.  This format is felt to be most appropriate for this patient at this time.  The patient did not have access to video technology/had technical difficulties with video requiring transitioning to audio format only (telephone).  All issues noted in this document were discussed and addressed.  No physical exam could be performed with this format.  Please refer to the patient's chart for his consent to telehealth for [facility].  Evaluation Performed:  Preoperative cardiovascular risk assessment _____________   Date:  07/18/2022   Patient ID:  [name], DOB April 02, 1934, MRN [id number] Patient Location:  Home Provider location:   Office  Primary Care Provider:  Angelica Chessman, MD Primary Cardiologist:  Nanetta Batty, MD  Chief Complaint / Patient Profile   89 y.o. y/o male with a h/o CAD s/p PCI with DES to LAD 2009, chronic diastolic heart failure, nonsustained VT, moderate aortic stenosis, hypertension, hyperlipidemia, T2DM on insulin, CKD stage IV, chronic back and joint pain who is pending TUR bladder tumor by Dr. Alvester Morin on 07/22/2022 and presents today for telephonic preoperative cardiovascular risk assessment.  History of Present Illness    [name] is a 89 y.o. male who presents via audio/video conferencing for a telehealth visit today.  Pt was last seen in cardiology clinic on 04/16/2022 by Dr. Allyson Sabal.  At that time Pauline Good was stable from a cardiac standpoint.  The patient is now pending procedure as outlined above. Since his last visit, he continues to do well. No chest pain, pressure, or tightness. Denies orthopnea or PND. No palpitations.  Patient reports occasional shortness of breath and DOE that is his baseline and unchanged for years.  He has chronic lower extremity edema L>R which is  managed by daily Lasix.  His activity is limited by chronic pain but he is able to participate in light to moderate household chores and performs all ADLs independently.  Past Medical History    Past Medical History:  Diagnosis Date   Allergy    CAD (coronary artery disease)    pci to LAD 10/09; stable CAD by cath 05/31/08; myoview 04/11/11- no ishcemia, EF 67%; echo 05/15/09- EF>55%, mod calcification of the aortic valve leaflets   CAD S/P percutaneous coronary angioplasty 05/08/2008   LAD PCI with DES 2009- Myoview low risk 2013   CHF (congestive heart failure) 09/07/2019   Chronic back pain    Chronic kidney disease    Cystitis, radiation 12/01/2013   ED (erectile dysfunction)    Elevated serum creatinine 12/14/2015   Essential hypertension, benign    Hematuria 12/01/2013   Hyperlipidemia    Ileus 12/14/2015   Insulin dependent type 2 diabetes mellitus, controlled    Malignant neoplasm of prostate 11/26/2013   Multiple rib fractures    left 9th, 10th, and 11th posterior rib fractures S/P fall 12/09/2015/notes 12/12/2015   Osteoarthritis of right knee 03/04/2018   Personal history of COVID-19 09/05/2021   PREMATURE VENTRICULAR CONTRACTIONS 05/08/2008   Qualifier: Diagnosis of  By: Johney Frame, MD, James     Prostate cancer    s/p   Rectal bleeding 12/23/2012   Evaluated by Dr Elnoria Howard GI patient with history of radiation proctitis. Patient due for colonoscopy on 04/2014    Type II or unspecified type diabetes mellitus without mention of complication, not stated as uncontrolled  Ventricular tachycardia    resuscitated; monitor 05/2008; attempted T ablation 2/10- aborted due to inappropriate substrate   Past Surgical History:  Procedure Laterality Date   CARDIAC CATHETERIZATION  05/31/08   EF 55-60%, stable lesions, medical therapy   COLECTOMY  '80's   polyps   CORONARY ANGIOPLASTY WITH STENT PLACEMENT  01/19/08   PCI stent to mid LAD with Promus DES 27.5x28   CYSTOSCOPY WITH DIRECT VISION  INTERNAL URETHROTOMY [name] 03/12/2021   Procedure: cystoscoopy withdirect vision internal urethrotomy optilum urethral dilation     ;  Surgeon: Crista ElliotBell, Eugene D III, MD;  [facility]: [facility];  Service: Urology;  Laterality: [name];  RERQUESTING 45 MINS   IR GENERIC HISTORICAL  01/03/2016   IR THORACENTESIS ASP PLEURAL SPACE W/IMG GUIDE 01/03/2016 [facility]   KNEE ARTHROPLASTY Right 03/04/2018   Procedure: COMPUTER ASSISTED TOTAL KNEE ARTHROPLASTY;  Surgeon: Samson FredericSwinteck, Brian, MD;  [facility];  Service: Orthopedics;  Laterality: Right;   PTCA  01/2008   TRANSURETHRAL RESECTION OF BLADDER TUMOR [name] 11/10/2020   Procedure: Caprice KluverYSTOSCOPY, CLOT EVACTUATION,FULGURATION;  Surgeon: Malen GauzeMcKenzie, Patrick L, MD;  [facility];  Service: Urology;  Laterality: [name];   TRANSURETHRAL RESECTION OF BLADDER TUMOR WITH MITOMYCIN-C [name] 09/17/2021   Procedure: TRANSURETHRAL RESECTION OF BLADDER TUMOR WITH GEMCITABINE;  Surgeon: Crista ElliotBell, Eugene D III, MD;  [facility];  Service: Urology;  Laterality: [name];    Allergies  Allergies  Allergen Reactions   Hydralazine Shortness Of Breath    Shortness of breath and lower extremity edema    Home Medications    Prior to Admission medications   Medication Sig Start Date End Date Taking? Authorizing Provider  acetaminophen (TYLENOL) 500 MG tablet Take 1,000 mg by mouth in the morning.    [provider]  amLODipine (NORVASC) 10 MG tablet TAKE 1 TABLET EVERY DAY (APPOINTMENT IS NEEDED FOR REFILLS) 06/26/21   Runell GessBerry, Jonathan J, MD  atorvastatin (LIPITOR) 10 MG tablet TAKE 1 TABLET EVERY DAY 05/06/22   Runell GessBerry, Jonathan J, MD  Blood Glucose Monitoring Suppl (ACCU-CHEK AVIVA PLUS) w/Device KIT Test blood sugar 3 times daily. Dx code: E11.22 04/03/16   Shade FloodGreene, Jeffrey R, MD  Blood Pressure Monitoring (BLOOD PRESS MONITOR/M-L CUFF) MISC 1 application by Does not apply route daily. 04/26/20   Shade FloodGreene, Jeffrey R, MD  calcitRIOL (ROCALTROL) 0.25 MCG capsule Take 0.25 mcg by mouth 3  (three) times a week.    [provider]  carvedilol (COREG) 25 MG tablet TAKE 1 TABLET TWICE DAILY. KEEP OFFICE VISIT 01/14/22   Runell GessBerry, Jonathan J, MD  Continuous Blood Gluc Sensor (FREESTYLE LIBRE SENSOR SYSTEM) MISC 1 application by Does not apply route daily. 06/30/20   Shade FloodGreene, Jeffrey R, MD  Continuous Glucose Monitor Sup KIT 1 kit by Does not apply route as directed. 04/26/20   Shade FloodGreene, Jeffrey R, MD  furosemide (LASIX) 40 MG tablet TAKE 1 TABLET TWICE DAILY Patient taking differently: Take 40 mg by mouth daily. 02/11/22   Runell GessBerry, Jonathan J, MD  glucose blood (ACCU-CHEK AVIVA PLUS) test strip Test blood sugar 3 times daily. Dx code: E11.22 04/03/16   Shade FloodGreene, Jeffrey R, MD  hydrALAZINE (APRESOLINE) 50 MG tablet Take 50 mg by mouth in the morning and at bedtime. 05/25/22   [provider]  HYDROcodone-acetaminophen (NORCO) 5-325 MG tablet Take 1 tablet by mouth every 4 (four) hours as needed for moderate pain. 03/12/21   Crista ElliotBell, Eugene D III, MD  insulin NPH-regular Human (NOVOLIN 70/30) (70-30) 100 UNIT/ML injection  Inject 11 Units into the skin 2 (two) times daily with a meal. Patient taking differently: Inject 8 Units into the skin in the morning and at bedtime. 06/30/20   Shade Flood, MD  Insulin Syringe-Needle U-100 (B-D INS SYR HALF-UNIT .3CC/31G) 31G X 5/16" 0.3 ML MISC Use daily at bedtime to inject insulin. Dx code: 250.00 06/21/13   Shade Flood, MD  isosorbide mononitrate (IMDUR) 60 MG 24 hr tablet Take 2 tablets (120 mg total) by mouth daily. Patient taking differently: Take 120 mg by mouth in the morning and at bedtime. 02/04/22   Marjie Skiff E, PA-C  Lancets (ACCU-CHEK MULTICLIX) lancets Test blood sugar 3 times daily. Dx code: E11.22 04/03/16   Shade Flood, MD  Multiple Vitamin (MULTIVITAMIN) tablet Take 1 tablet by mouth daily.    [provider]  Omega-3 Fatty Acids (FISH OIL PO) Take 1 capsule by mouth daily.    [provider]   tamsulosin (FLOMAX) 0.4 MG CAPS capsule Take 0.4 mg by mouth daily. 01/29/21   [provider]    Physical Exam    Vital Signs:  Conlan L Carleton does not have vital signs available for review today.  Given telephonic nature of communication, physical exam is limited. AAOx3. NAD. Normal affect.  Speech and respirations are unlabored.  Accessory Clinical Findings    None  Assessment & Plan    Primary Cardiologist: Nanetta Batty, MD  Preoperative cardiovascular risk assessment.  TUR bladder tumor by Dr. Alvester Morin on 07/22/2022.  Chart reviewed as part of pre-operative protocol coverage. According to the RCRI, patient has a 11% % risk of MACE. Patient reports activity equivalent to 4.73 METS (per DASI).   Given past medical history and time since last visit, based on ACC/AHA guidelines, Omario L Bourn would be at acceptable risk for the planned procedure without further cardiovascular testing.   Patient was advised that if he develops new symptoms prior to surgery to contact our office to arrange a follow-up appointment.  he verbalized understanding.   I will route this recommendation to the requesting party via Epic fax function.  Please call with questions.  Time:   Today, I have spent 8 minutes with the patient with telehealth technology discussing medical history, symptoms, and management plan.     Carlos Levering, NP  07/18/2022, 4:38 PM

## 2022-07-18 NOTE — Progress Notes (Addendum)
Case: [id number] Date/Time: 07/22/22 1030   Procedure: TRANSURETHRAL RESECTION OF BLADDER TUMOR (TURBT)   Anesthesia type: General   Pre-op diagnosis: BLADDER TUMOR   [facility]   Surgeons: Crista ElliotBell, Eugene D III, MD       DISCUSSION: [name] is an 89 yo male who presents for above surgery. PMH significant for former smoker (quit 2019), IDDM, HTN, hx of prostate cancer, CAD, Vtach, CHF, CKD, anemia. Has previously underwent anesthesia without complications. ASA 4. Case originally scheduled for 4/15/ but rescheduled for 5/6.  #CAD s/p LAD stent (Oct 2009) #Chronic dCHF EF 60-65% #Hx of Ventricular tachycardia #HTN -Followed by Cardiology (last seen 04/16/22). Stable at last OV and at PAT visit. -Last echo was done 09/12/21 for moderate AS and is being monitored yearly  #Preoperative cardiovascular risk assessment:   "Chart reviewed as part of pre-operative protocol coverage. According to the RCRI, patient has a 11% % risk of MACE. Patient reports activity equivalent to 4.73 METS (per DASI).    Given past medical history and time since last visit, based on ACC/AHA guidelines, [name] would be at acceptable risk for the planned procedure without further cardiovascular testing."   #IDDM -Followed by PCP and Endocrinology. -Hg A1c 6.4 on 05/29/22  #CKD stage IV -Followed by PCP and Nephrology  #Anemia -Followed by PCP. Likely anemia of chronic disease due to CKD -Hgb is 10.6 which is stable from prior values -Not on blood thinners  VS: BP (!) 158/73   Pulse 68   Temp 36.8 C (Oral)   Resp 16   Ht 6' (1.829 m)   Wt 103.9 kg   SpO2 99%   BMI 31.06 kg/m   PROVIDERS: Angelica ChessmanAguiar, Rafaela M, MD Cardiologist: Nanetta BattyJonathan Berry, MD Nephrology:  Endocrinology: Izell Carolinahaval Patel, MD    LABS: Labs reviewed: Acceptable for surgery. (all labs ordered are listed, but only abnormal results are displayed)  Labs Reviewed  CBC - Abnormal; Notable for the  following components:      Result Value   RBC 4.06 (*)    Hemoglobin 10.6 (*)    HCT 34.3 (*)    RDW 16.7 (*)    All other components within normal limits  BASIC METABOLIC PANEL - Abnormal; Notable for the following components:   Chloride 112 (*)    CO2 19 (*)    Glucose, Bld 118 (*)    BUN 54 (*)    Creatinine, Ser 4.84 (*)    Calcium 8.5 (*)    GFR, Estimated 11 (*)    All other components within normal limits  GLUCOSE, CAPILLARY - Abnormal; Notable for the following components:   Glucose-Capillary 101 (*)    All other components within normal limits     IMAGES: n/a   EKG: 12/20/21: NSR with PACs   CV   Echo 09/12/21: IMPRESSIONS     1. The aortic valve is tricuspid. There is severe calcifcation of the  aortic valve. There is severe thickening of the aortic valve. Aortic valve  regurgitation is mild. Moderate aortic valve stenosis. Aortic valve area,  by VTI measures 1.12 cm. Aortic  valve mean gradient measures 20.0 mmHg. Aortic valve Vmax measures 3.06  m/s.   2. Left ventricular ejection fraction, by estimation, is 60 to 65%. The  left ventricle has normal function. The left ventricle has no regional  wall motion abnormalities. There is moderate concentric left ventricular  hypertrophy. Left ventricular  diastolic parameters are consistent with Grade  II diastolic dysfunction  (pseudonormalization).   3. Right ventricular systolic function is normal. The right ventricular  size is normal.   4. Left atrial size was severely dilated.   5. Right atrial size was mildly dilated.   6. The mitral valve is grossly normal. Trivial mitral valve  regurgitation. No evidence of mitral stenosis.   Comparison(s): Changes from prior study are noted. AS is now moderate.   Past Medical History:  Diagnosis Date   Allergy    CAD (coronary artery disease)    pci to LAD 10/09; stable CAD by cath 05/31/08; myoview 04/11/11- no ishcemia, EF 67%; echo 05/15/09- EF>55%, mod  calcification of the aortic valve leaflets   CAD S/P percutaneous coronary angioplasty 05/08/2008   LAD PCI with DES 2009- Myoview low risk 2013   CHF (congestive heart failure) 09/07/2019   Chronic back pain    Chronic kidney disease    Cystitis, radiation 12/01/2013   ED (erectile dysfunction)    Elevated serum creatinine 12/14/2015   Essential hypertension, benign    Hematuria 12/01/2013   Hyperlipidemia    Ileus 12/14/2015   Insulin dependent type 2 diabetes mellitus, controlled    Malignant neoplasm of prostate 11/26/2013   Multiple rib fractures    left 9th, 10th, and 11th posterior rib fractures S/P fall 12/09/2015/notes 12/12/2015   Osteoarthritis of right knee 03/04/2018   Personal history of COVID-19 09/05/2021   PREMATURE VENTRICULAR CONTRACTIONS 05/08/2008   Qualifier: Diagnosis of  By: Johney Frame, MD, James     Prostate cancer    s/p   Rectal bleeding 12/23/2012   Evaluated by Dr Elnoria Howard GI patient with history of radiation proctitis. Patient due for colonoscopy on 04/2014    Type II or unspecified type diabetes mellitus without mention of complication, not stated as uncontrolled    Ventricular tachycardia    resuscitated; monitor 05/2008; attempted T ablation 2/10- aborted due to inappropriate substrate    Past Surgical History:  Procedure Laterality Date   CARDIAC CATHETERIZATION  05/31/08   EF 55-60%, stable lesions, medical therapy   COLECTOMY  '80's   polyps   CORONARY ANGIOPLASTY WITH STENT PLACEMENT  01/19/08   PCI stent to mid LAD with Promus DES 27.5x28   CYSTOSCOPY WITH DIRECT VISION INTERNAL URETHROTOMY [name] 03/12/2021   Procedure: cystoscoopy withdirect vision internal urethrotomy optilum urethral dilation     ;  Surgeon: Crista Elliot, MD;  [facility];  Service: Urology;  Laterality: [name];  RERQUESTING 45 MINS   IR GENERIC HISTORICAL  01/03/2016   IR THORACENTESIS ASP PLEURAL SPACE W/IMG GUIDE 01/03/2016 [facility]   KNEE ARTHROPLASTY Right 03/04/2018    Procedure: COMPUTER ASSISTED TOTAL KNEE ARTHROPLASTY;  Surgeon: Samson Frederic, MD;  [facility];  Service: Orthopedics;  Laterality: Right;   PTCA  01/2008   TRANSURETHRAL RESECTION OF BLADDER TUMOR [name] 11/10/2020   Procedure: Caprice Kluver;  Surgeon: Malen Gauze, MD;  [facility];  Service: Urology;  Laterality: [name];   TRANSURETHRAL RESECTION OF BLADDER TUMOR WITH MITOMYCIN-C [name] 09/17/2021   Procedure: TRANSURETHRAL RESECTION OF BLADDER TUMOR WITH GEMCITABINE;  Surgeon: Crista Elliot, MD;  [facility];  Service: Urology;  Laterality: [name];    MEDICATIONS:  acetaminophen (TYLENOL) 500 MG tablet   amLODipine (NORVASC) 10 MG tablet   atorvastatin (LIPITOR) 10 MG tablet   Blood Glucose Monitoring Suppl (ACCU-CHEK AVIVA PLUS) w/Device KIT   Blood Pressure Monitoring (BLOOD PRESS MONITOR/M-L CUFF) MISC   calcitRIOL (  ROCALTROL) 0.25 MCG capsule   carvedilol (COREG) 25 MG tablet   Continuous Blood Gluc Sensor (FREESTYLE LIBRE SENSOR SYSTEM) MISC   Continuous Glucose Monitor Sup KIT   furosemide (LASIX) 40 MG tablet   glucose blood (ACCU-CHEK AVIVA PLUS) test strip   hydrALAZINE (APRESOLINE) 50 MG tablet   HYDROcodone-acetaminophen (NORCO) 5-325 MG tablet   insulin NPH-regular Human (NOVOLIN 70/30) (70-30) 100 UNIT/ML injection   Insulin Syringe-Needle U-100 (B-D INS SYR HALF-UNIT .3CC/31G) 31G X 5/16" 0.3 ML MISC   isosorbide mononitrate (IMDUR) 60 MG 24 hr tablet   Lancets (ACCU-CHEK MULTICLIX) lancets   Multiple Vitamin (MULTIVITAMIN) tablet   Omega-3 Fatty Acids (FISH OIL PO)   tamsulosin (FLOMAX) 0.4 MG CAPS capsule   No current facility-administered medications for this encounter.   Marcille Blanco [facility] Short Stay/Anesthesiology [facility] Phone [phone] 07/22/2022 9:08 AM

## 2022-07-18 NOTE — Telephone Encounter (Signed)
Pt has been added on today for pre op appt as we just were sent a clearance today for procedure Monday 07/22/22. Med rec and consent are done.

## 2022-07-18 NOTE — Telephone Encounter (Signed)
   Pre-operative Risk Assessment    Patient Name: [name]  DOB: 1933-05-31 MRN: [id number]      Request for Surgical Clearance    Procedure:  TUR Bladder Tumor  Date of Surgery:  Clearance 07/22/22                                 Surgeon:  Dr. Alvester Morin Surgeon's Group or Practice Name:  [facility] Phone number:  [phone] Fax number:  [phone]   Type of Clearance Requested:   - Medical    Type of Anesthesia:  General    Additional requests/questions:  Please advise surgeon/provider what medications should be held.  Signed, Belisicia T Woods   07/18/2022, 11:37 AM

## 2022-07-18 NOTE — Telephone Encounter (Signed)
Pt has been added on today for pre op appt as we just were sent a clearance today for procedure Monday 07/22/22. Med rec and consent are done.     Patient Consent for Virtual Visit        [name] has provided verbal consent on 07/18/2022 for a virtual visit (video or telephone).   CONSENT FOR VIRTUAL VISIT FOR:  [name]  By participating in this virtual visit I agree to the following:  I hereby voluntarily request, consent and authorize [facility] and its employed or contracted physicians, physician assistants, nurse practitioners or other licensed health care professionals (the Practitioner), to provide me with telemedicine health care services (the "Services") as deemed necessary by the treating Practitioner. I acknowledge and consent to receive the Services by the Practitioner via telemedicine. I understand that the telemedicine visit will involve communicating with the Practitioner through live audiovisual communication technology and the disclosure of certain medical information by electronic transmission. I acknowledge that I have been given the opportunity to request an in-person assessment or other available alternative prior to the telemedicine visit and am voluntarily participating in the telemedicine visit.  I understand that I have the right to withhold or withdraw my consent to the use of telemedicine in the course of my care at any time, without affecting my right to future care or treatment, and that the Practitioner or I may terminate the telemedicine visit at any time. I understand that I have the right to inspect all information obtained and/or recorded in the course of the telemedicine visit and may receive copies of available information for a reasonable fee.  I understand that some of the potential risks of receiving the Services via telemedicine include:  Delay or interruption in medical evaluation due to technological equipment failure or  disruption; Information transmitted may not be sufficient (e.g. poor resolution of images) to allow for appropriate medical decision making by the Practitioner; and/or  In rare instances, security protocols could fail, causing a breach of personal health information.  Furthermore, I acknowledge that it is my responsibility to provide information about my medical history, conditions and care that is complete and accurate to the best of my ability. I acknowledge that Practitioner's advice, recommendations, and/or decision may be based on factors not within their control, such as incomplete or inaccurate data provided by me or distortions of diagnostic images or specimens that may result from electronic transmissions. I understand that the practice of medicine is not an exact science and that Practitioner makes no warranties or guarantees regarding treatment outcomes. I acknowledge that a copy of this consent can be made available to me via my patient portal [facility] MyChart), or I can request a printed copy by calling the office of [facility].    I understand that my insurance will be billed for this visit.   I have read or had this consent read to me. I understand the contents of this consent, which adequately explains the benefits and risks of the Services being provided via telemedicine.  I have been provided ample opportunity to ask questions regarding this consent and the Services and have had my questions answered to my satisfaction. I give my informed consent for the services to be provided through the use of telemedicine in my medical care

## 2022-07-19 ENCOUNTER — Telehealth: Payer: Medicare HMO

## 2022-07-22 NOTE — Anesthesia Preprocedure Evaluation (Addendum)
Anesthesia Evaluation  Patient identified by MRN, date of birth, ID band Patient awake    Reviewed: Allergy & Precautions, NPO status , Patient's Chart, lab work & pertinent test results, reviewed documented beta blocker date and time   Airway Mallampati: I  TM Distance: >3 FB Neck ROM: Full    Dental  (+) Edentulous Upper, Edentulous Lower   Pulmonary former smoker    + decreased breath sounds      Cardiovascular hypertension, Pt. on medications and Pt. on home beta blockers + CAD, + Cardiac Stents and +CHF   Rhythm:Regular Rate:Normal  Echo: 1. Left ventricular ejection fraction, by estimation, is 60 to 65%. The  left ventricle has normal function. The left ventricle has no regional  wall motion abnormalities. There is mild left ventricular hypertrophy.  Left ventricular diastolic parameters  are consistent with Grade II diastolic dysfunction (pseudonormalization).   2. Right ventricular systolic function is normal. The right ventricular  size is normal. There is mildly elevated pulmonary artery systolic  pressure.   3. Left atrial size was mildly dilated.   4. Right atrial size was mildly dilated.   5. The mitral valve is normal in structure. No evidence of mitral valve  regurgitation. No evidence of mitral stenosis.   6. DI - 0.35, SVi 36. The aortic valve is calcified. There is moderate  calcification of the aortic valve. There is moderate thickening of the  aortic valve. Aortic valve regurgitation is mild. Moderate aortic valve  stenosis. Aortic valve area, by VTI  measures 1.09 cm. Aortic valve mean gradient measures 28.0 mmHg.   7. The inferior vena cava is normal in size with greater than 50%  respiratory variability, suggesting right atrial pressure of 3 mmHg.      Neuro/Psych negative neurological ROS  negative psych ROS   GI/Hepatic negative GI ROS, Neg liver ROS,,,  Endo/Other  diabetes, Type 2, Insulin  Dependent    Renal/GU Renal disease     Musculoskeletal  (+) Arthritis ,    Abdominal   Peds  Hematology   Anesthesia Other Findings   Reproductive/Obstetrics                             Anesthesia Physical Anesthesia Plan  ASA: 3  Anesthesia Plan: General   Post-op Pain Management: Tylenol PO (pre-op)*   Induction: Intravenous  PONV Risk Score and Plan: 3 and Ondansetron, Midazolam and Treatment may vary due to age or medical condition  Airway Management Planned: Oral ETT  Additional Equipment: None  Intra-op Plan:   Post-operative Plan: Extubation in OR  Informed Consent: I have reviewed the patients History and Physical, chart, labs and discussed the procedure including the risks, benefits and alternatives for the proposed anesthesia with the patient or authorized representative who has indicated his/her understanding and acceptance.       Plan Discussed with: CRNA  Anesthesia Plan Comments:         Anesthesia Quick Evaluation

## 2022-07-28 ENCOUNTER — Other Ambulatory Visit: Payer: Self-pay | Admitting: Cardiovascular Disease

## 2022-07-28 DIAGNOSIS — I10 Essential (primary) hypertension: Secondary | ICD-10-CM

## 2022-07-29 NOTE — Telephone Encounter (Signed)
Rx request sent to pharmacy.  

## 2022-07-31 ENCOUNTER — Telehealth: Payer: Self-pay | Admitting: Cardiovascular Disease

## 2022-07-31 NOTE — Telephone Encounter (Signed)
Patient states SOB for the last few days with minimal exertion. Left leg has small amount of swelling, with possible  pitting based on conversation.  Patient taking all Cardiac and Diuretics as ordered.  Sounds good while on the phone. No SOB or distress noted.  Scheduled with D Wittenborn Monday at 8:50 am FYI States Kidney doctor changed Vit D3 to M-W-F. Patient states .  Advised to elevate legs when at rest (states he does often). Advised to watch sodium intake.  If at anytime he becomes SOB to distress he is to seek medical attention by 911 or ED. He states understanding.

## 2022-07-31 NOTE — Telephone Encounter (Signed)
Pt c/o Shortness Of Breath: STAT if SOB developed within the last 24 hours or pt is noticeably SOB on the phone  1. Are you currently SOB (can you hear that pt is SOB on the phone)? no  2. How long have you been experiencing SOB? Couples days   3. Are you SOB when sitting or when up moving around? Moving around  4. Are you currently experiencing any other symptoms? None    Schedule patient on 4/29

## 2022-08-03 NOTE — Progress Notes (Unsigned)
Cardiology Clinic Note   Date: 08/05/2022 ID: Ugonna, Keirsey Jul 22, 1933, MRN [id number]  Primary Cardiologist:  Nanetta Batty, MD  Patient Profile    [name] is a 89 y.o. male who presents to the clinic today for evaluation of shortness of breath.  Past medical history significant for: CAD. LHC 12/28/2007 (unstable angina): Ostial LAD 40%.  Mid LAD 70 to 90%.  PCI with DES to mid LAD. LHC 05/31/2008 (angina): Proximal RCA 20%, mid RCA 30%, PDA 50%.  Diffuse irregularities LCx.  Patent LAD stent. Nuclear stress test 04/11/2011: Normal, low risk study. Diastolic heart failure/aortic stenosis. Echo 09/12/2021: EF 60 to 65%.  Moderate concentric LVH.  Grade II DD.  Severe LAE.  Mild RAE.  Trivial MR.  Severe calcification/thickening aortic valve.  Mild AI.  Moderate aortic valve stenosis, mean gradient 20 mmHg.  Aortic stenosis previously mild to moderate with mean gradient 16 mmHg in June 2022. Nonsustained VT. Attempted VT ablation February 20 10 (not performed secondary to inappropriate substrate). Hypertension. Hyperlipidemia. Lipid panel 05/29/2022: LDL 57, HDL 37, TG 93, total 113. Insulin-dependent T2DM. CKD stage IV. Prostate CA.   History of Present Illness    [name] is a longtime patient of cardiology.  He is followed by Dr. Allyson Sabal for the above outlined history.  Patient was last seen in the office by Dr. Allyson Sabal on 04/16/2022.  He was doing well at that time and no changes were made.  More recently, patient contacted triage on 07/31/2022 with complaints of shortness of breath: "Patient states SOB for the last few days with minimal exertion. Left leg has small amount of swelling, with possible  pitting based on conversation. Patient taking all Cardiac and Diuretics as ordered.  Sounds good while on the phone. No SOB or distress noted.  Scheduled with D Kobee Medlen Monday at 8:50 amFYI States Kidney doctor changed Vit D3 to M-W-F. Patient states 25mg . Advised to  elevate legs when at rest (states he does often). Advised to watch sodium intake.  If at anytime he becomes SOB to distress he is to seek medical attention by 911 or ED. He states understanding."  Today, patient is accompanied by his wife.  He reports noticing L>R lower extremity edema last week that is more than usual.  He also reports 1.5-week history of dyspnea with a small amount of exertion.  He reports he feels he was able to do more with less dyspnea previously.  Brisk diuresis on Lasix.  Unfortunately he has an issue with his bladder for which he manages with intermittent in and out catheterization and has bladder surgery planned for Monday.  He denies chest pain, pressure, tightness.  No palpitations.  Patient's wife is concerned about him being able to undergo his surgery on Monday.  Given increase in symptoms we will arrange echo this week for further evaluation so hopefully bladder surgery will not be delayed.   ROS: All other systems reviewed and are otherwise negative except as noted in History of Present Illness.  Studies Reviewed    ECG personally reviewed by me today: Sinus rhythm with frequent PVCs, 77 bpm.  No significant changes from 12/20/2021.       Physical Exam    VS:  BP 134/70 (BP Location: Left Arm, Patient Position: Sitting, Cuff Size: Large)   Pulse 77   Ht 6' (1.829 m)   Wt 233 lb (105.7 kg)   BMI 31.60 kg/m  , BMI Body mass index is 31.6 kg/m.  GEN: Well nourished, well developed, in no acute distress. Neck: No JVD or carotid bruits. Cardiac:  RRR.  Occasional extrasystole.  No murmurs. No rubs or gallops.   Respiratory:  Respirations regular and unlabored. Clear to auscultation without rales, wheezing or rhonchi. GI: Soft, nontender, nondistended. Extremities: Radials/DP/PT 2+ and equal bilaterally. No clubbing or cyanosis. 1+ pitting edema L>R lower extremity.  Skin: Warm and dry, no rash. Neuro: Strength intact.  Assessment & Plan    DOE/diastolic  heart failure/aortic stenosis/lower extremity edema.  Echo June 2023 showed normal LV function, Grade II DD, moderate aortic stenosis (slightly worsened from 2022).  Patient reports an increase in lower extremity edema and dyspnea on exertion for the past 1.5 weeks.  Lungs clear to auscultation. 1+ pitting  L>R lower extremity edema despite compression socks and elevation.  Patient is scheduled for repeat echo 09/11/2022.  Will move echo up for further evaluation.  Will make this a priority for this week secondary to patient's upcoming bladder surgery on Monday.  Patient will take Lasix 60 mg in the a.m. and 40 mg in the p.m. for the next 3 days starting today then resume Lasix 40 mg twice daily thereafter.  Patient instructed to weigh daily (log provided).  Return Friday for repeat BMP.  Continue carvedilol, hydralazine, isosorbide. CAD.  S/p PCI with DES to mid LAD September 2009.  LHC February 2010 showed patent stent.  Normal low risk nuclear stress test January 2013.  Patient denies chest pain, pressure, tightness.  Continue atorvastatin, carvedilol, isosorbide. Hypertension.  BP today 134/70.  Patient denies headaches, dizziness or vision changes. Continue amlodipine, carvedilol, hydralazine, isosorbide. Hyperlipidemia.  LDL February 2024 57, at goal.  Continue atorvastatin.  Disposition: Echo for DOE.  Weigh daily (log provided).  BMP Friday.  Keep previously scheduled visit with Dr. Allyson Sabal in July or return sooner as needed.         Signed, Etta Grandchild. Mattisen Pohlmann, DNP, NP-C

## 2022-08-05 ENCOUNTER — Ambulatory Visit: Payer: Medicare HMO | Attending: Student | Admitting: Student

## 2022-08-05 ENCOUNTER — Encounter: Payer: Self-pay | Admitting: Student

## 2022-08-05 VITALS — BP 134/70 | HR 77 | Ht 72.0 in | Wt 233.0 lb

## 2022-08-05 DIAGNOSIS — I10 Essential (primary) hypertension: Secondary | ICD-10-CM

## 2022-08-05 DIAGNOSIS — I359 Nonrheumatic aortic valve disorder, unspecified: Secondary | ICD-10-CM

## 2022-08-05 DIAGNOSIS — R0609 Other forms of dyspnea: Secondary | ICD-10-CM

## 2022-08-05 DIAGNOSIS — I2510 Atherosclerotic heart disease of native coronary artery without angina pectoris: Secondary | ICD-10-CM

## 2022-08-05 DIAGNOSIS — I5032 Chronic diastolic (congestive) heart failure: Secondary | ICD-10-CM | POA: Diagnosis not present

## 2022-08-05 DIAGNOSIS — Z9861 Coronary angioplasty status: Secondary | ICD-10-CM

## 2022-08-05 DIAGNOSIS — R600 Localized edema: Secondary | ICD-10-CM

## 2022-08-05 DIAGNOSIS — E785 Hyperlipidemia, unspecified: Secondary | ICD-10-CM

## 2022-08-05 NOTE — Patient Instructions (Addendum)
Medication Instructions:  Your physician has recommended you make the following change in your medication:   -Take furosemide (lasix) 60mg  1.5 tablets in the morning. Continue evening dose of 40mg .   *If you need a refill on your cardiac medications before your next appointment, please call your pharmacy*   Lab Work: Your physician recommends that you return for lab work on: Friday (5/3) for BMET   If you have labs (blood work) drawn today and your tests are completely normal, you will receive your results only by: MyChart Message (if you have MyChart) OR A paper copy in the mail If you have any lab test that is abnormal or we need to change your treatment, we will call you to review the results.   Testing/Procedures: Your physician has requested that you have an echocardiogram. Echocardiography is a painless test that uses sound waves to create images of your heart. It provides your doctor with information about the size and shape of your heart and how well your heart's chambers and valves are working. This procedure takes approximately one hour. There are no restrictions for this procedure. Please do NOT wear cologne, perfume, aftershave, or lotions (deodorant is allowed). Please arrive 15 minutes prior to your appointment time. This will take place at [facility] on Wednesday May 1st @ 2pm.    Follow-Up: At [facility], you and your health needs are our priority.  As part of our continuing mission to provide you with exceptional heart care, we have created designated Provider Care Teams.  These Care Teams include your primary Cardiologist (physician) and Advanced Practice Providers (APPs -  Physician Assistants and Nurse Practitioners) who all work together to provide you with the care you need, when you need it.  We recommend signing up for the patient portal called "MyChart".  Sign up information is provided on this After Visit Summary.  MyChart is used to connect  with patients for Virtual Visits (Telemedicine).  Patients are able to view lab/test results, encounter notes, upcoming appointments, etc.  Non-urgent messages can be sent to your provider as well.   To learn more about what you can do with MyChart, go to [email or web address].    Your next appointment:   3 month(s)  Provider:   Nanetta Batty, MD   Other Instructions Continue to monitor your weight daily and keep a log.

## 2022-08-07 ENCOUNTER — Ambulatory Visit (HOSPITAL_BASED_OUTPATIENT_CLINIC_OR_DEPARTMENT_OTHER)
Admission: RE | Admit: 2022-08-07 | Discharge: 2022-08-07 | Disposition: A | Discharge status: Home / Self Care | Payer: Medicare HMO | Source: Ambulatory Visit | Attending: Cardiovascular Disease | Admitting: Cardiovascular Disease

## 2022-08-07 DIAGNOSIS — R0609 Other forms of dyspnea: Secondary | ICD-10-CM

## 2022-08-07 DIAGNOSIS — I5032 Chronic diastolic (congestive) heart failure: Secondary | ICD-10-CM | POA: Diagnosis not present

## 2022-08-07 LAB — ECHOCARDIOGRAM COMPLETE
AR max vel: 0.84 cm2
AV Area VTI: 1.09 cm2
AV Area mean vel: 0.83 cm2
AV Mean grad: 28 mmHg
AV Peak grad: 52.4 mmHg
Ao pk vel: 3.62 m/s
Area-P 1/2: 3.21 cm2
P 1/2 time: 559 msec
S' Lateral: 2.9 cm

## 2022-08-08 NOTE — Progress Notes (Signed)
Updated date of surgery: 08/12/22  Updated time of arrival: 0515 AM  Patient will be discharged from hospital and monitored at home for 24 hours by: Larene Beach (wife) [phone]  Patient denies any changes in allergies, medications, medical history since pre op appointment on: Patient stated he is not allergic to any medications no other changes from 07/17/22  Pre op instructions reviewed, follow up questions addressed and patient verbalized understanding at this time.

## 2022-08-09 LAB — BASIC METABOLIC PANEL
BUN/Creatinine Ratio: 10 (ref 10–24)
BUN: 52 mg/dL — ABNORMAL HIGH (ref 8–27)
CO2: 20 mmol/L (ref 20–29)
Calcium: 8.6 mg/dL (ref 8.6–10.2)
Chloride: 111 mmol/L — ABNORMAL HIGH (ref 96–106)
Creatinine, Ser: 5.39 mg/dL — ABNORMAL HIGH (ref 0.76–1.27)
Glucose: 133 mg/dL — ABNORMAL HIGH (ref 70–99)
Potassium: 5.4 mmol/L — ABNORMAL HIGH (ref 3.5–5.2)
Sodium: 142 mmol/L (ref 134–144)
eGFR: 10 mL/min/{1.73_m2} — ABNORMAL LOW (ref >59)

## 2022-08-12 ENCOUNTER — Other Ambulatory Visit: Payer: Self-pay

## 2022-08-12 ENCOUNTER — Ambulatory Visit (HOSPITAL_COMMUNITY): Payer: Medicare HMO | Admitting: Medical

## 2022-08-12 ENCOUNTER — Encounter (HOSPITAL_COMMUNITY)
Admission: RE | Disposition: A | Discharge status: Home / Self Care | Payer: Self-pay | Source: Ambulatory Visit | Attending: Urology

## 2022-08-12 ENCOUNTER — Encounter (HOSPITAL_COMMUNITY): Payer: Self-pay | Admitting: Urology

## 2022-08-12 ENCOUNTER — Ambulatory Visit (HOSPITAL_COMMUNITY)
Admission: RE | Admit: 2022-08-12 | Discharge: 2022-08-12 | Disposition: A | Discharge status: Home / Self Care | Payer: Medicare HMO | Source: Ambulatory Visit | Attending: Urology | Admitting: Urology

## 2022-08-12 ENCOUNTER — Ambulatory Visit (HOSPITAL_BASED_OUTPATIENT_CLINIC_OR_DEPARTMENT_OTHER): Payer: Medicare HMO | Admitting: Medical

## 2022-08-12 DIAGNOSIS — D303 Benign neoplasm of bladder: Secondary | ICD-10-CM | POA: Diagnosis not present

## 2022-08-12 DIAGNOSIS — R338 Other retention of urine: Secondary | ICD-10-CM | POA: Insufficient documentation

## 2022-08-12 DIAGNOSIS — I2510 Atherosclerotic heart disease of native coronary artery without angina pectoris: Secondary | ICD-10-CM | POA: Diagnosis not present

## 2022-08-12 DIAGNOSIS — R310 Gross hematuria: Secondary | ICD-10-CM | POA: Diagnosis not present

## 2022-08-12 DIAGNOSIS — I509 Heart failure, unspecified: Secondary | ICD-10-CM | POA: Diagnosis not present

## 2022-08-12 DIAGNOSIS — I110 Hypertensive heart disease with heart failure: Secondary | ICD-10-CM | POA: Diagnosis not present

## 2022-08-12 DIAGNOSIS — Z955 Presence of coronary angioplasty implant and graft: Secondary | ICD-10-CM

## 2022-08-12 DIAGNOSIS — D494 Neoplasm of unspecified behavior of bladder: Secondary | ICD-10-CM | POA: Diagnosis present

## 2022-08-12 DIAGNOSIS — Z87891 Personal history of nicotine dependence: Secondary | ICD-10-CM

## 2022-08-12 DIAGNOSIS — Z8546 Personal history of malignant neoplasm of prostate: Secondary | ICD-10-CM | POA: Diagnosis not present

## 2022-08-12 DIAGNOSIS — E119 Type 2 diabetes mellitus without complications: Secondary | ICD-10-CM

## 2022-08-12 DIAGNOSIS — N401 Enlarged prostate with lower urinary tract symptoms: Secondary | ICD-10-CM | POA: Diagnosis not present

## 2022-08-12 DIAGNOSIS — Z01818 Encounter for other preprocedural examination: Secondary | ICD-10-CM

## 2022-08-12 DIAGNOSIS — Z794 Long term (current) use of insulin: Secondary | ICD-10-CM

## 2022-08-12 HISTORY — PX: TRANSURETHRAL RESECTION OF BLADDER TUMOR: SHX2575

## 2022-08-12 LAB — GLUCOSE, CAPILLARY
Glucose-Capillary: 105 mg/dL — ABNORMAL HIGH (ref 70–99)
Glucose-Capillary: 100 mg/dL — ABNORMAL HIGH (ref 70–99)

## 2022-08-12 SURGERY — TURBT (TRANSURETHRAL RESECTION OF BLADDER TUMOR)
Anesthesia: General

## 2022-08-12 MED ORDER — ONDANSETRON HCL 4 MG/2ML IJ SOLN
INTRAMUSCULAR | Status: AC
  Filled 2022-08-12: qty 2

## 2022-08-12 MED ORDER — FENTANYL CITRATE (PF) 100 MCG/2ML IJ SOLN
INTRAMUSCULAR | Status: DC | PRN
  Administered 2022-08-12: 50 ug via INTRAVENOUS

## 2022-08-12 MED ORDER — PROMETHAZINE HCL 25 MG/ML IJ SOLN: 6.2500 mg | INTRAMUSCULAR | Status: DC | PRN

## 2022-08-12 MED ORDER — LACTATED RINGERS IV SOLN: INTRAVENOUS | Status: DC

## 2022-08-12 MED ORDER — CHLORHEXIDINE GLUCONATE 0.12 % MT SOLN
15.0000 mL | Freq: Once | OROMUCOSAL | Status: AC
  Administered 2022-08-12: 15 mL via OROMUCOSAL

## 2022-08-12 MED ORDER — ACETAMINOPHEN 10 MG/ML IV SOLN: 1000.0000 mg | Freq: Once | INTRAVENOUS | Status: DC | PRN

## 2022-08-12 MED ORDER — OXYCODONE HCL 5 MG/5ML PO SOLN: 5.0000 mg | Freq: Once | ORAL | Status: DC | PRN

## 2022-08-12 MED ORDER — FENTANYL CITRATE (PF) 100 MCG/2ML IJ SOLN
INTRAMUSCULAR | Status: AC
  Filled 2022-08-12: qty 2

## 2022-08-12 MED ORDER — SUGAMMADEX SODIUM 200 MG/2ML IV SOLN
INTRAVENOUS | Status: DC | PRN
  Administered 2022-08-12: 200 mg via INTRAVENOUS

## 2022-08-12 MED ORDER — PHENYLEPHRINE 80 MCG/ML (10ML) SYRINGE FOR IV PUSH (FOR BLOOD PRESSURE SUPPORT)
PREFILLED_SYRINGE | INTRAVENOUS | Status: DC | PRN
  Administered 2022-08-12: 160 ug via INTRAVENOUS
  Administered 2022-08-12 (×2): 80 ug via INTRAVENOUS

## 2022-08-12 MED ORDER — DEXAMETHASONE SODIUM PHOSPHATE 10 MG/ML IJ SOLN
INTRAMUSCULAR | Status: DC | PRN
  Administered 2022-08-12: 4 mg via INTRAVENOUS

## 2022-08-12 MED ORDER — ACETAMINOPHEN 325 MG PO TABS: 325.0000 mg | ORAL_TABLET | ORAL | Status: DC | PRN

## 2022-08-12 MED ORDER — SODIUM CHLORIDE 0.9 % IR SOLN
Status: DC | PRN
  Administered 2022-08-12: 6000 mL via INTRAVESICAL

## 2022-08-12 MED ORDER — ONDANSETRON HCL 4 MG/2ML IJ SOLN
INTRAMUSCULAR | Status: DC | PRN
  Administered 2022-08-12: 4 mg via INTRAVENOUS

## 2022-08-12 MED ORDER — OXYCODONE HCL 5 MG PO TABS: 5.0000 mg | ORAL_TABLET | Freq: Once | ORAL | Status: DC | PRN

## 2022-08-12 MED ORDER — PROPOFOL 10 MG/ML IV BOLUS
INTRAVENOUS | Status: DC | PRN
  Administered 2022-08-12: 170 mg via INTRAVENOUS

## 2022-08-12 MED ORDER — FENTANYL CITRATE PF 50 MCG/ML IJ SOSY: 25.0000 ug | PREFILLED_SYRINGE | INTRAMUSCULAR | Status: DC | PRN

## 2022-08-12 MED ORDER — LIDOCAINE 2% (20 MG/ML) 5 ML SYRINGE
INTRAMUSCULAR | Status: DC | PRN
  Administered 2022-08-12: 80 mg via INTRAVENOUS

## 2022-08-12 MED ORDER — ACETAMINOPHEN 160 MG/5ML PO SOLN: 325.0000 mg | ORAL | Status: DC | PRN

## 2022-08-12 MED ORDER — ORAL CARE MOUTH RINSE: 15.0000 mL | Freq: Once | OROMUCOSAL | Status: AC

## 2022-08-12 MED ORDER — PROPOFOL 10 MG/ML IV BOLUS
INTRAVENOUS | Status: AC
  Filled 2022-08-12: qty 20

## 2022-08-12 MED ORDER — SODIUM CHLORIDE 0.9 % IV SOLN: INTRAVENOUS | Status: DC

## 2022-08-12 MED ORDER — ACETAMINOPHEN 500 MG PO TABS: 1000.0000 mg | ORAL_TABLET | Freq: Once | ORAL | Status: DC

## 2022-08-12 MED ORDER — CEFAZOLIN SODIUM-DEXTROSE 2-4 GM/100ML-% IV SOLN
2.0000 g | INTRAVENOUS | Status: AC
  Administered 2022-08-12: 2 g via INTRAVENOUS
  Filled 2022-08-12: qty 100

## 2022-08-12 MED ORDER — DEXAMETHASONE SODIUM PHOSPHATE 10 MG/ML IJ SOLN
INTRAMUSCULAR | Status: AC
  Filled 2022-08-12: qty 1

## 2022-08-12 MED ORDER — ROCURONIUM BROMIDE 10 MG/ML (PF) SYRINGE
PREFILLED_SYRINGE | INTRAVENOUS | Status: DC | PRN
  Administered 2022-08-12: 50 mg via INTRAVENOUS

## 2022-08-12 MED ORDER — HYDROCODONE-ACETAMINOPHEN 5-325 MG PO TABS: 1.0000 | ORAL_TABLET | ORAL | 0 refills | Status: DC | PRN

## 2022-08-12 SURGICAL SUPPLY — 21 items
BAG DRN RND TRDRP ANRFLXCHMBR (UROLOGICAL SUPPLIES) ×1
BAG URINE DRAIN 2000ML AR STRL (UROLOGICAL SUPPLIES) IMPLANT
BAG URO CATCHER STRL LF (MISCELLANEOUS) ×1 IMPLANT
CATH FOLEY 2WAY SLVR  5CC 18FR (CATHETERS)
CATH FOLEY 2WAY SLVR  5CC 20FR (CATHETERS) ×1
CATH FOLEY 2WAY SLVR 5CC 18FR (CATHETERS) IMPLANT
CATH FOLEY 2WAY SLVR 5CC 20FR (CATHETERS) IMPLANT
DRAPE FOOT SWITCH (DRAPES) ×1 IMPLANT
ELECT REM PT RETURN 15FT ADLT (MISCELLANEOUS) ×1 IMPLANT
GLOVE BIO SURGEON STRL SZ7.5 (GLOVE) ×1 IMPLANT
GOWN STRL REUS W/ TWL XL LVL3 (GOWN DISPOSABLE) ×1 IMPLANT
GOWN STRL REUS W/TWL XL LVL3 (GOWN DISPOSABLE) ×1
KIT TURNOVER KIT A (KITS) IMPLANT
LOOP CUT BIPOLAR 24F LRG (ELECTROSURGICAL) IMPLANT
MANIFOLD NEPTUNE II (INSTRUMENTS) ×1 IMPLANT
PACK CYSTO (CUSTOM PROCEDURE TRAY) ×1 IMPLANT
PLUG CATH AND CAP STER (CATHETERS) IMPLANT
SYR TOOMEY IRRIG 70ML (MISCELLANEOUS)
SYRINGE TOOMEY IRRIG 70ML (MISCELLANEOUS) IMPLANT
TUBING CONNECTING 10 (TUBING) ×1 IMPLANT
TUBING UROLOGY SET (TUBING) ×1 IMPLANT

## 2022-08-12 NOTE — Anesthesia Postprocedure Evaluation (Signed)
Anesthesia Post Note  Patient: Pauline Good  Procedure(s) Performed: TRANSURETHRAL RESECTION OF BLADDER TUMOR (TURBT)     Patient location during evaluation: PACU Anesthesia Type: General Level of consciousness: awake and alert Pain management: pain level controlled Vital Signs Assessment: post-procedure vital signs reviewed and stable Respiratory status: spontaneous breathing, nonlabored ventilation, respiratory function stable and patient connected to nasal cannula oxygen Cardiovascular status: blood pressure returned to baseline and stable Postop Assessment: no apparent nausea or vomiting Anesthetic complications: no  No notable events documented.  Last Vitals:  Vitals:   08/12/22 0900 08/12/22 0915  BP: (!) 161/72 (!) 159/72  Pulse: 74 72  Resp: 13 20  Temp:  (!) 36.4 C  SpO2: 91% 94%    Last Pain:  Vitals:   08/12/22 0915  TempSrc:   PainSc: 0-No pain                 Shelton Silvas

## 2022-08-12 NOTE — Op Note (Signed)
Operative Note  Preoperative diagnosis:  1.  Bladder tumor  Postoperative diagnosis: 1.  Bladder tumor--medium  Procedure(s): 1.  Transurethral resection of bladder tumor--medium 2.  Urethral dilation  Surgeon: Modena Slater, MD  Assistants: None  Anesthesia: General  Complications: None immediate  EBL: Minimal  Specimens: 1.  Bladder tumor  Drains/Catheters: 1.  20 French Foley catheter  Intraoperative findings: 1.  Normal pendulous urethra.  Bulbar/prostatic urethra tight but able to transversed with the scope. 2.  Bladder mucosa with moderate trabeculation.  Subtle irregularities on the right and left lateral walls.  Both areas biopsied/resected and fulgurated.  Total area of resection on each side was about 2 to 3 cm.  Indication: 89 year old male with history of low-grade bladder cancer presents for previously mentioned operation after finding of irregularity in the bladder.  Description of procedure:  The patient was identified and consent was obtained.  The patient was taken to the operating room and placed in the supine position.  The patient was placed under general anesthesia.  Perioperative antibiotics were administered.  The patient was placed in dorsal lithotomy.  Patient was prepped and draped in a standard sterile fashion and a timeout was performed.  The urethra was a little bit too tight for a resectoscope.  Therefore I sequentially dilated with sounds from 24 [place] up to 30 [place].  I was then able to easily pass the 26 [place] resectoscope with a visual obturator in place into the urethra.  There was some tightness at the bulbar/prostatic urethra but was able to transversed with the scope and advanced into the bladder.  I exchanged for the bipolar working element.  Complete cystoscopy was performed and the area of interest was resected and/or fulgurated.  Biopsy specimens were collected.  Fulgurated any other abnormal areas.  There was no evidence of any active  bleeding or perforation.  Resection was well away from the ureteral orifices which were in orthotopic position.  I withdrew the scope and placed a Foley catheter.  This concluded the operation.  Patient tolerated the procedure well and stable postoperatively.  Plan: Follow-up in 1 week.  He will have his catheter removed and may resume clean intermittent catheterization.

## 2022-08-12 NOTE — Transfer of Care (Signed)
Immediate Anesthesia Transfer of Care Note  Patient: [name]  Procedure(s) Performed: TRANSURETHRAL RESECTION OF BLADDER TUMOR (TURBT)  Patient Location: PACU  Anesthesia Type:General  Level of Consciousness: awake, alert , and patient cooperative  Airway & Oxygen Therapy: Patient Spontanous Breathing and Patient connected to face mask oxygen  Post-op Assessment: Report given to RN and Post -op Vital signs reviewed and stable  Post vital signs: Reviewed and stable  Last Vitals:  Vitals Value Taken Time  BP 170/77 08/12/22 0826  Temp    Pulse 80 08/12/22 0828  Resp 15 08/12/22 0828  SpO2 100 % 08/12/22 0828  Vitals shown include unvalidated device data.  Last Pain:  Vitals:   08/12/22 0559  TempSrc: Oral  PainSc: 0-No pain         Complications: No notable events documented.

## 2022-08-12 NOTE — Discharge Instructions (Signed)

## 2022-08-12 NOTE — Anesthesia Procedure Notes (Signed)
Procedure Name: Intubation Date/Time: 08/12/2022 7:43 AM  Performed by: Sindy Guadeloupe, CRNAPre-anesthesia Checklist: Patient identified, Emergency Drugs available, Suction available, Patient being monitored and Timeout performed Patient Re-evaluated:Patient Re-evaluated prior to induction Oxygen Delivery Method: Circle system utilized Preoxygenation: Pre-oxygenation with 100% oxygen Induction Type: IV induction Ventilation: Mask ventilation without difficulty Laryngoscope Size: Mac and 4 Grade View: Grade II Tube type: Oral Tube size: 7.5 mm Number of attempts: 1 Airway Equipment and Method: Stylet Placement Confirmation: ETT inserted through vocal cords under direct vision, positive ETCO2 and breath sounds checked- equal and bilateral Secured at: 23 cm Tube secured with: Tape Dental Injury: Teeth and Oropharynx as per pre-operative assessment

## 2022-08-12 NOTE — H&P (Signed)
CC/HPI: CC: Gross hematuria  HPI:  09/18/2020  Patient went to the emergency department on 09/13/2020 with hematuria with clots. He was found to have a hemoglobin of 12.5, creatinine 2.21. No imaging. Foley catheter was placed and his bladder was irrigated. He no longer has a Foley catheter. He had an episode of gross hematuria 4 years ago and underwent cystoscopy that was negative reportedly. He has a history of prostate cancer Gleason 5 + 4 with some pelvic adenopathy. He had combination ADT with radiation. He does not recall his last PSA. Previously followed in [facility]. He denies any voiding complaints other than the gross hematuria. No dysuria. No pain.   10/18/2020  Patient underwent a CT IVP. This revealed no evidence of genitourinary malignancy. No stones. He continues to have intermittent gross hematuria sometimes with clots. He is voiding well and emptying his bladder. He presents for cystoscopy.   11/17/2020  Since last visit, the patient presented to the emergency department with gross hematuria with clot retention. He was taken the operating room for cystoscopy with clot evacuation where he was found to have a small superficial tumor. 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 Tumor fragments measured 0.1 x 0.1 cm. He has no further gross hematuria. He received 1 more unit of PRBCs on Tuesday for symptomatic acute blood-loss anemia. He also complains about some weak stream.   01/04/2021  Patient comes in with primary complaints of penile swelling and difficulty voiding. PVR is 390. He is voiding small amounts. Does not feel like he is in retention. He is not uncomfortable. Wants to avoid a catheter if possible. Occasional dysuria. He is circumcised. Residual Foreskin does not typically cover his glans.   01/12/2021: Catheter placement suggested at last office visit but patient declined. Treated empirically for underlying infectious process with cephalexin. Urine  culture resulted positive for strep viridans. He is on tamsulosin. Now back today for f/u visit with PVR.   Today better, penile edema has resolved. He states he feels like stream is improving as well. Still weak but now more consistent and with less hesitancy/straining. He states he feels like he is emptying more appropriately at this point, he does continue with an elevated residual decreased to 320 mL on today's exam, however PVR was done about an hour after he presented to the office so likely not reflective of a true residual. He denies any interval dysuria or gross hematuria. Nocturia stable 2-3 times nightly. He continues to struggle with lower extremity edema but wears compression stockings.   02/13/2021  Patient presents today for surveillance cystoscopy. He has occasional splitting of his urinary stream. He has had issues with incomplete bladder emptying. He does not feel like he is having significant difficulty voiding.   05/10/2021  Patient is status post DVIU with optilum urethral dilation. He presents today for surveillance cystoscopy.   08/07/2021  Patient presents for surveillance cystoscopy. He has been having some incomplete bladder emptying, frequency, postvoid dribbling. States he has not been taking tamsulosin. He also complains about some erectile dysfunction. Getting some erection but not enough for penetration. Has never tried sildenafil. Would like to try this. He denies nitrates/nitroglycerin.   09/21/2021  Patient returns after TURBT with instillation of gemcitabine. I did have to dilate his urethral stricture. Therefore, left a Foley catheter. He is voiding after removal of the catheter. He does have an elevated residual which is chronic for him. Pathology revealed early low-grade noninvasive urothelial cell carcinoma.   12/20/2021  Patient underwent induction BCG. Presents for surveillance cystoscopy. He has some postvoid dribbling and a chronically elevated residual. He also  has some incontinence and dribbling at rest.   03/21/2022  Patient presents for surveillance cystoscopy. He continues to have some urinary incontinence at times. Has a known high capacity bladder and chronically elevated residual/retention. No interval hematuria.   06/20/2022  PSA 0.19. No interval gross hematuria. Has not been catheterizing. Wife wants to learn and help him out.   08/12/2022 Patient presents today for transurethral resection of bladder tumor      ALLERGIES: No Known Drug Allergies    MEDICATIONS: Tamsulosin Hcl 0.4 mg capsule 1 capsule PO Daily  Amlodipine Besylate 10 mg tablet Oral  Atorvastatin Calcium 10 mg tablet  Carvedilol 25 mg tablet  Furosemide  Isosorbide Mononitrate  Lisinopril 40 mg tablet Oral  Novolin 70-30  Sildenafil Citrate 100 mg tablet 1 tablet PO prn Take 1 tab po 1hr prior to sexual activities  Sodium Bicarbonate     GU PSH: Bladder Instill AntiCA Agent - 11/28/2021, 11/21/2021, 11/14/2021, 10/31/2021, 10/24/2021, 10/17/2021 Cysto Dilate Stricture (M or F) - 03/12/2021 Cystoscopy - 03/21/2022, 12/20/2021, 08/07/2021, 05/10/2021, 02/13/2021, 10/18/2020 Cystoscopy VIU - 03/12/2021       PSH Notes: Cholecystectomy, Enteroscopic Polypectomy, Knee Surgery   NON-GU PSH: Cardiac Stent Placement Cholecystectomy (open) - 2008 Extensive Prostate Surgery Small Bowel Endoscopy - 2008         GU PMH: Bladder Cancer Lateral - 03/21/2022, - 12/20/2021, - 11/28/2021, - 11/21/2021, - 11/14/2021, - 11/07/2021, - 10/31/2021, - 10/24/2021, - 10/17/2021, - 09/21/2021, - 08/07/2021, - 05/10/2021, - 03/16/2021, - 02/13/2021, - 01/04/2021, - 11/17/2020 Bulbar urethral stricture - 03/21/2022, - 12/20/2021, - 09/21/2021, - 08/07/2021, - 05/10/2021, - 03/16/2021, - 02/13/2021 Prostate Cancer - 03/21/2022, - 08/07/2021, - 05/10/2021, - 11/17/2020, - 10/18/2020 (Stable), - 09/18/2020, Prostate cancer, - 2014, Prostate cancer, - 2014 Urinary Retention - 03/21/2022 Incomplete bladder emptying (Stable) - 12/20/2021,  - 01/12/2021, - 01/04/2021 BPH w/LUTS - 08/07/2021 ED due to arterial insufficiency (Stable) - 08/07/2021, Erectile dysfunction due to arterial insufficiency, - 2014 Post-void dribbling - 08/07/2021 Urinary Frequency - 08/07/2021 Weak Urinary Stream - 08/07/2021 Acute Cystitis/UTI - 01/12/2021, - 01/04/2021 Gross hematuria - 11/17/2020, - 10/18/2020, - 10/03/2020, - 09/18/2020 Radiation cystitis (with hematuria) - 10/18/2020 Hydrocele, Unspec, Hydrocele, left - 2014      PMH Notes:  1898-04-08 00:00:00 - Note: Normal Routine History And Physical Designer, 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[age])   NON-GU PMH: Diabetes Type 2, Diabetes mellitus - 2014 Hypertension, Hypertension - 2014 Personal history of other diseases of the circulatory system, History of hypertension - 2014 Personal history of other endocrine, nutritional and metabolic disease, History of hypercholesterolemia - 2014, History of diabetes mellitus, - 2014    FAMILY HISTORY: 2 daughters - Other 1 son - Other Acute Myocardial Infarction - Runs In Family Adenocarcinoma Of The Pancreas - Mother Death In The Family Father - Runs In Family Death In The Family Mother - Runs In Family Family Health Status Number - Runs In Family Heart Disease - Runs In Family leukemia - Runs In Family   SOCIAL HISTORY: Marital Status: Married Preferred Language: English; Race: Black or African American Current Smoking Status: Patient does not smoke anymore. Has not smoked since 09/07/1990.  <DIV'  Tobacco Use Assessment Completed:  Used Tobacco in last 30 days?   Drinks 3 caffeinated drinks per day.     Notes: Tobacco Use, Caffeine Use, Alcohol Use, Marital History - Single   REVIEW  OF SYSTEMS:     GU Review Male:  Patient denies frequent urination, hard to postpone urination, burning/ pain with urination, get up at night to urinate, leakage of urine, stream starts and stops, trouble starting your stream, have to strain to urinate , erection problems, and penile pain.     Gastrointestinal (Upper):  Patient denies nausea, vomiting, and indigestion/ heartburn.    Gastrointestinal (Lower):  Patient denies diarrhea and constipation.    Constitutional:  Patient denies fever, night sweats, weight loss, and fatigue.    Skin:  Patient denies skin rash/ lesion and itching.    Eyes:  Patient denies blurred vision and double vision.    Ears/ Nose/ Throat:  Patient denies sore throat and sinus problems.    Hematologic/Lymphatic:  Patient denies swollen glands and easy bruising.    Cardiovascular:  Patient denies leg swelling and chest pains.    Respiratory:  Patient denies cough and shortness of breath.    Endocrine:  Patient denies excessive thirst.    Musculoskeletal:  Patient denies back pain and joint pain.    Neurological:  Patient denies headaches and dizziness.    Psychologic:  Patient denies depression and anxiety.    VITAL SIGNS: None     MULTI-SYSTEM PHYSICAL EXAMINATION:      Constitutional: Well-nourished. No physical deformities. Normally developed. Good grooming.     Respiratory: No labored breathing, no use of accessory muscles.      Gastrointestinal: No mass, no tenderness, no rigidity, non obese abdomen.               ASSESSMENT:     ICD-10 Details  1 GU:  Bladder Cancer Lateral - C67.2   2  Prostate Cancer - C61   3  Urinary Retention - R33.8   4  BPH w/LUTS - N40.1    PLAN:   Proceed with cystoscopy with transurethral resection of bladder tumor.08/12/2022

## 2022-08-13 ENCOUNTER — Encounter (HOSPITAL_COMMUNITY): Payer: Self-pay | Admitting: Urology

## 2022-08-13 LAB — SURGICAL PATHOLOGY

## 2022-08-28 LAB — LAB REPORT - SCANNED: EGFR: 9

## 2022-09-11 ENCOUNTER — Other Ambulatory Visit (HOSPITAL_COMMUNITY): Payer: Medicare HMO

## 2022-10-10 ENCOUNTER — Other Ambulatory Visit: Payer: Self-pay | Admitting: Student

## 2022-10-11 MED ORDER — ISOSORBIDE MONONITRATE ER 60 MG PO TB24: 120.0000 mg | ORAL_TABLET | Freq: Every day | ORAL | 2 refills | Status: DC

## 2022-10-17 ENCOUNTER — Telehealth: Payer: Self-pay | Admitting: *Deleted

## 2022-10-17 NOTE — Telephone Encounter (Signed)
  Patient Consent for Virtual Visit   MEDICINES NEED TO BE REVIEWED AT VISIT   MED REC CONSENT DONE.Marland KitchenSMO        [name] has provided verbal consent on 10/17/2022 for a virtual visit (video or telephone).   CONSENT FOR VIRTUAL VISIT FOR:  [name]  By participating in this virtual visit I agree to the following:  I hereby voluntarily request, consent and authorize [facility] and its employed or contracted physicians, physician assistants, nurse practitioners or other licensed health care professionals (the Practitioner), to provide me with telemedicine health care services (the "Services") as deemed necessary by the treating Practitioner. I acknowledge and consent to receive the Services by the Practitioner via telemedicine. I understand that the telemedicine visit will involve communicating with the Practitioner through live audiovisual communication technology and the disclosure of certain medical information by electronic transmission. I acknowledge that I have been given the opportunity to request an in-person assessment or other available alternative prior to the telemedicine visit and am voluntarily participating in the telemedicine visit.  I understand that I have the right to withhold or withdraw my consent to the use of telemedicine in the course of my care at any time, without affecting my right to future care or treatment, and that the Practitioner or I may terminate the telemedicine visit at any time. I understand that I have the right to inspect all information obtained and/or recorded in the course of the telemedicine visit and may receive copies of available information for a reasonable fee.  I understand that some of the potential risks of receiving the Services via telemedicine include:  Delay or interruption in medical evaluation due to technological equipment failure or disruption; Information transmitted may not be sufficient (e.g. poor resolution of  images) to allow for appropriate medical decision making by the Practitioner; and/or  In rare instances, security protocols could fail, causing a breach of personal health information.  Furthermore, I acknowledge that it is my responsibility to provide information about my medical history, conditions and care that is complete and accurate to the best of my ability. I acknowledge that Practitioner's advice, recommendations, and/or decision may be based on factors not within their control, such as incomplete or inaccurate data provided by me or distortions of diagnostic images or specimens that may result from electronic transmissions. I understand that the practice of medicine is not an exact science and that Practitioner makes no warranties or guarantees regarding treatment outcomes. I acknowledge that a copy of this consent can be made available to me via my patient portal [facility] MyChart), or I can request a printed copy by calling the office of [facility].    I understand that my insurance will be billed for this visit.   I have read or had this consent read to me. I understand the contents of this consent, which adequately explains the benefits and risks of the Services being provided via telemedicine.  I have been provided ample opportunity to ask questions regarding this consent and the Services and have had my questions answered to my satisfaction. I give my informed consent for the services to be provided through the use of telemedicine in my medical care

## 2022-10-17 NOTE — Telephone Encounter (Signed)
   Name: [name]  DOB: 04-30-1933  MRN: [id number]  Primary Cardiologist: Nanetta Batty, MD   Preoperative team, please contact this patient and set up a phone call appointment for further preoperative risk assessment. Please obtain consent and complete medication review. Thank you for your help.     Roe Rutherford Rick Warnick, PA 10/17/2022, 1:20 PM [facility]

## 2022-10-17 NOTE — Telephone Encounter (Signed)
MED REC CONSENT DONE. PATIENT SCHEDULED 7-12.SMO

## 2022-10-17 NOTE — Telephone Encounter (Signed)
   Pre-operative Risk Assessment    Patient Name: [name]  DOB: 1933/10/04 MRN: [id number]      Request for Surgical Clearance    Procedure:   LAPAROSCOPIC PERITONEAL DIALYSIS CATHETER INSERTION WITH OMENTOPEXY  Date of Surgery:  Clearance 10/21/22                                 Surgeon:  DR. Buzzy Han Surgeon's Group or Practice Name:  [facility]  Phone number:  [phone] Fax number:  [phone]   Type of Clearance Requested:   - Medical ; NO MEDICATIONS LISTED AS NEEDING TO BE HELD   Type of Anesthesia:  Not Indicated   Additional requests/questions:    Elpidio Anis   10/17/2022, 11:21 AM

## 2022-10-18 ENCOUNTER — Ambulatory Visit: Payer: Medicare HMO | Attending: Internal Medicine

## 2022-10-18 DIAGNOSIS — Z01810 Encounter for preprocedural cardiovascular examination: Secondary | ICD-10-CM

## 2022-10-18 NOTE — Progress Notes (Signed)
Virtual Visit via Telephone Note   Because of [name]'s co-morbid illnesses, he is at least at moderate risk for complications without adequate follow up.  This format is felt to be most appropriate for this patient at this time.  The patient did not have access to video technology/had technical difficulties with video requiring transitioning to audio format only (telephone).  All issues noted in this document were discussed and addressed.  No physical exam could be performed with this format.  Please refer to the patient's chart for his consent to telehealth for [facility].  Evaluation Performed:  Preoperative cardiovascular risk assessment _____________   Date:  10/18/2022   Patient ID:  [name], DOB 01-05-1934, MRN [id number] Patient Location:  Home Provider location:   Office  Primary Care Provider:  Angelica Chessman, MD Primary Cardiologist:  Nanetta Batty, MD  Chief Complaint / Patient Profile   89 y.o. y/o male with a h/o the below medical problems who is pending peritoneal dialysis catheter insertion on 10/21/2022 and presents today for telephonic preoperative cardiovascular risk assessment.  CAD. LHC 12/28/2007 (unstable angina): Ostial LAD 40%.  Mid LAD 70 to 90%.  PCI with DES to mid LAD. LHC 05/31/2008 (angina): Proximal RCA 20%, mid RCA 30%, PDA 50%.  Diffuse irregularities LCx.  Patent LAD stent. Nuclear stress test 04/11/2011: Normal, low risk study. Diastolic heart failure/aortic stenosis. Echo 09/12/2021: EF 60 to 65%.  Moderate concentric LVH.  Grade II DD.  Severe LAE.  Mild RAE.  Trivial MR.  Severe calcification/thickening aortic valve.  Mild AI.  Moderate aortic valve stenosis, mean gradient 20 mmHg.  Aortic stenosis previously mild to moderate with mean gradient 16 mmHg in June 2022. Nonsustained VT. Attempted VT ablation February 20 10 (not performed secondary to inappropriate substrate). Hypertension. Hyperlipidemia. Lipid panel  05/29/2022: LDL 57, HDL 37, TG 93, total 113. Insulin-dependent T2DM. CKD stage IV. Prostate CA.  History of Present Illness    [name] is a 89 y.o. male who presents via audio/video conferencing for a telehealth visit today.  Pt was last seen in cardiology clinic on August 05, 2022 by Carlos Levering, NP.  At that time TARRANT NYGAARD was doing well outside of some lower extremity swelling left greater than right and dyspnea.  The patient is now pending procedure as outlined above. Since his last visit, he tells me that he no longer short of breath and has not had any swelling for weeks.  He does now require dialysis and is getting a peritoneal catheter placed on Monday.  However, he has been doing well from a cardiac standpoint.  No chest pain, shortness of breath, or lower extremity edema.  No palpitations.  He does meet 4 METS on the DASI even though he is somewhat limited with his walking.  No medications are indicated as needing held.  Past Medical History    Past Medical History:  Diagnosis Date   Allergy    CAD (coronary artery disease)    pci to LAD 10/09; stable CAD by cath 05/31/08; myoview 04/11/11- no ishcemia, EF 67%; echo 05/15/09- EF>55%, mod calcification of the aortic valve leaflets   CAD S/P percutaneous coronary angioplasty 05/08/2008   LAD PCI with DES 2009- Myoview low risk 2013   CHF (congestive heart failure) (HCC) 09/07/2019   Chronic back pain    Chronic kidney disease    Cystitis, radiation 12/01/2013   ED (erectile dysfunction)    Elevated serum creatinine 12/14/2015  Essential hypertension, benign    Hematuria 12/01/2013   Hyperlipidemia    Ileus (HCC) 12/14/2015   Insulin dependent type 2 diabetes mellitus, controlled (HCC)    Malignant neoplasm of prostate (HCC) 11/26/2013   Multiple rib fractures    left 9th, 10th, and 11th posterior rib fractures S/P fall 12/09/2015/notes 12/12/2015   Osteoarthritis of right knee 03/04/2018   Personal history of  COVID-19 09/05/2021   PREMATURE VENTRICULAR CONTRACTIONS 05/08/2008   Qualifier: Diagnosis of  By: Johney Frame, MD, James     Prostate cancer [facility])    s/p   Rectal bleeding 12/23/2012   Evaluated by Dr Elnoria Howard GI patient with history of radiation proctitis. Patient due for colonoscopy on 04/2014    Type II or unspecified type diabetes mellitus without mention of complication, not stated as uncontrolled    Ventricular tachycardia (HCC)    resuscitated; monitor 05/2008; attempted T ablation 2/10- aborted due to inappropriate substrate   Past Surgical History:  Procedure Laterality Date   CARDIAC CATHETERIZATION  05/31/08   EF 55-60%, stable lesions, medical therapy   COLECTOMY  '80's   polyps   CORONARY ANGIOPLASTY WITH STENT PLACEMENT  01/19/08   PCI stent to mid LAD with Promus DES 27.5x28   CYSTOSCOPY WITH DIRECT VISION INTERNAL URETHROTOMY [name] 03/12/2021   Procedure: cystoscoopy withdirect vision internal urethrotomy optilum urethral dilation     ;  Surgeon: Crista Elliot, MD;  Location: [facility];  Service: Urology;  Laterality: [name];  RERQUESTING 45 MINS   IR GENERIC HISTORICAL  01/03/2016   IR THORACENTESIS ASP PLEURAL SPACE W/IMG GUIDE 01/03/2016 [facility]-INTERV RAD   KNEE ARTHROPLASTY Right 03/04/2018   Procedure: COMPUTER ASSISTED TOTAL KNEE ARTHROPLASTY;  Surgeon: Samson Frederic, MD;  Location: [facility];  Service: Orthopedics;  Laterality: Right;   PTCA  01/2008   TRANSURETHRAL RESECTION OF BLADDER TUMOR [name] 11/10/2020   Procedure: Caprice Kluver;  Surgeon: Malen Gauze, MD;  Location: [facility];  Service: Urology;  Laterality: [name];   TRANSURETHRAL RESECTION OF BLADDER TUMOR [name] 08/12/2022   Procedure: TRANSURETHRAL RESECTION OF BLADDER TUMOR (TURBT);  Surgeon: Crista Elliot, MD;  Location: [facility];  Service: Urology;  Laterality: [name];   TRANSURETHRAL RESECTION OF BLADDER TUMOR WITH MITOMYCIN-C [name] 09/17/2021   Procedure: TRANSURETHRAL RESECTION OF BLADDER TUMOR WITH  GEMCITABINE;  Surgeon: Crista Elliot, MD;  Location: [facility];  Service: Urology;  Laterality: [name];    Allergies  No Known Allergies  Home Medications    Prior to Admission medications   Medication Sig Start Date End Date Taking? Authorizing Provider  acetaminophen (TYLENOL) 500 MG tablet Take 1,000 mg by mouth in the morning.    [provider]  amLODipine (NORVASC) 10 MG tablet TAKE 1 TABLET EVERY DAY (APPOINTMENT IS NEEDED FOR REFILLS) 07/29/22   Runell Gess, MD  atorvastatin (LIPITOR) 10 MG tablet TAKE 1 TABLET EVERY DAY 05/06/22   Runell Gess, MD  Blood Glucose Monitoring Suppl (ACCU-CHEK AVIVA PLUS) w/Device KIT Test blood sugar 3 times daily. Dx code: E11.22 04/03/16   Shade Flood, MD  Blood Pressure Monitoring (BLOOD PRESS MONITOR/M-L CUFF) MISC 1 application by Does not apply route daily. 04/26/20   Shade Flood, MD  calcitRIOL (ROCALTROL) 0.25 MCG capsule Take 0.25 mcg by mouth 3 (three) times a week.    [provider]  carvedilol (COREG) 25 MG tablet TAKE 1 TABLET TWICE DAILY. KEEP OFFICE VISIT 01/14/22   Runell Gess, MD  Cholecalciferol (VITAMIN D-3 PO) Take 1 tablet by mouth 3 (three) times a week.    [provider]  Continuous Blood Gluc Sensor (FREESTYLE LIBRE SENSOR SYSTEM) MISC 1 application by Does not apply route daily. 06/30/20   Shade Flood, MD  Continuous Glucose Monitor Sup KIT 1 kit by Does not apply route as directed. 04/26/20   Shade Flood, MD  furosemide (LASIX) 40 MG tablet TAKE 1 TABLET TWICE DAILY Patient taking differently: Take 40 mg by mouth daily. 02/11/22   Runell Gess, MD  glucose blood (ACCU-CHEK AVIVA PLUS) test strip Test blood sugar 3 times daily. Dx code: E11.22 04/03/16   Shade Flood, MD  hydrALAZINE (APRESOLINE) 50 MG tablet Take 50 mg by mouth in the morning and at bedtime. 05/25/22   [provider]  HYDROcodone-acetaminophen (NORCO) 5-325 MG tablet Take 1 tablet  by mouth every 4 (four) hours as needed for moderate pain. 08/12/22   Crista Elliot, MD  insulin NPH-regular Human (NOVOLIN 70/30) (70-30) 100 UNIT/ML injection Inject 11 Units into the skin 2 (two) times daily with a meal. Patient taking differently: Inject 8 Units into the skin in the morning and at bedtime. 06/30/20   Shade Flood, MD  Insulin Syringe-Needle U-100 (B-D INS SYR HALF-UNIT .3CC/31G) 31G X 5/16" 0.3 ML MISC Use daily at bedtime to inject insulin. Dx code: 250.00 06/21/13   Shade Flood, MD  isosorbide mononitrate (IMDUR) 60 MG 24 hr tablet Take 2 tablets (120 mg total) by mouth daily. 10/11/22   Marjie Skiff E, PA-C  Lancets (ACCU-CHEK MULTICLIX) lancets Test blood sugar 3 times daily. Dx code: E11.22 04/03/16   Shade Flood, MD  Multiple Vitamin (MULTIVITAMIN) tablet Take 1 tablet by mouth daily.    [provider]  Omega-3 Fatty Acids (FISH OIL PO) Take 1 capsule by mouth daily.    [provider]  tamsulosin (FLOMAX) 0.4 MG CAPS capsule Take 0.4 mg by mouth daily. 01/29/21   [provider]    Physical Exam    Vital Signs:  Jaxston L Shoults does not have vital signs available for review today.  Given telephonic nature of communication, physical exam is limited. AAOx3. NAD. Normal affect.  Speech and respirations are unlabored.  Accessory Clinical Findings    None  Assessment & Plan    1.  Preoperative Cardiovascular Risk Assessment:  Mr. Moyse perioperative risk of a major cardiac event is 11% according to the Revised Cardiac Risk Index (RCRI).  Therefore, he is at high risk for perioperative complications.   His functional capacity is fair at 4.73 METs according to the [facility] Activity Status Index (DASI). Recommendations: According to ACC/AHA guidelines, no further cardiovascular testing needed.  The patient may proceed to surgery at acceptable risk.    The patient was advised that if he develops new symptoms prior to  surgery to contact our office to arrange for a follow-up visit, and he verbalized understanding.   A copy of this note will be routed to requesting surgeon.  Time:   Today, I have spent 6 minutes with the patient with telehealth technology discussing medical history, symptoms, and management plan.     Sharlene Dory, PA-C  10/18/2022, 3:06 PM

## 2022-11-06 ENCOUNTER — Ambulatory Visit: Payer: Medicare HMO | Attending: Cardiovascular Disease | Admitting: Cardiovascular Disease

## 2022-11-06 ENCOUNTER — Encounter: Payer: Self-pay | Admitting: Cardiovascular Disease

## 2022-11-06 VITALS — BP 112/64 | HR 71 | Ht 72.0 in | Wt 224.8 lb

## 2022-11-06 DIAGNOSIS — E785 Hyperlipidemia, unspecified: Secondary | ICD-10-CM

## 2022-11-06 DIAGNOSIS — I2510 Atherosclerotic heart disease of native coronary artery without angina pectoris: Secondary | ICD-10-CM | POA: Diagnosis not present

## 2022-11-06 DIAGNOSIS — Z9861 Coronary angioplasty status: Secondary | ICD-10-CM | POA: Diagnosis not present

## 2022-11-06 DIAGNOSIS — I10 Essential (primary) hypertension: Secondary | ICD-10-CM | POA: Diagnosis not present

## 2022-11-06 NOTE — Assessment & Plan Note (Signed)
History of dyslipidemia on statin therapy with lipid profile performed by his PCP 5 months ago revealing total cholesterol of 113, LDL of 57 and HDL of 37.  He is at goal for secondary prevention.

## 2022-11-06 NOTE — Assessment & Plan Note (Signed)
History of lower extremity edema probably related to renal insufficiency and diastolic dysfunction on diuretics.  He appears euvolemic with no edema today.

## 2022-11-06 NOTE — Patient Instructions (Signed)
Medication Instructions:  Your physician recommends that you continue on your current medications as directed. Please refer to the Current Medication list given to you today.  *If you need a refill on your cardiac medications before your next appointment, please call your pharmacy*   Testing/Procedures: Your physician has requested that you have an echocardiogram. Echocardiography is a painless test that uses sound waves to create images of your heart. It provides your doctor with information about the size and shape of your heart and how well your heart's chambers and valves are working. This procedure takes approximately one hour. There are no restrictions for this procedure. Please do NOT wear cologne, perfume, aftershave, or lotions (deodorant is allowed). Please arrive 15 minutes prior to your appointment time. This will take place at [address] [place] **To do in May 2025**    Follow-Up: At [facility], you and your health needs are our priority.  As part of our continuing mission to provide you with exceptional heart care, we have created designated Provider Care Teams.  These Care Teams include your primary Cardiologist (physician) and Advanced Practice Providers (APPs -  Physician Assistants and Nurse Practitioners) who all work together to provide you with the care you need, when you need it.  We recommend signing up for the patient portal called "MyChart".  Sign up information is provided on this After Visit Summary.  MyChart is used to connect with patients for Virtual Visits (Telemedicine).  Patients are able to view lab/test results, encounter notes, upcoming appointments, etc.  Non-urgent messages can be sent to your provider as well.   To learn more about what you can do with MyChart, go to [email or web address].    Your next appointment:   6 month(s)  Provider:   Jari Favre, PA-C or Marjie Skiff, PA-C        Then, Nanetta Batty, MD will plan to see  you again in 12 month(s).

## 2022-11-06 NOTE — Assessment & Plan Note (Signed)
History of essential hypertension blood pressure measured today at 112/64.  He is on amlodipine, and carvedilol as well as hydralazine.

## 2022-11-06 NOTE — Progress Notes (Signed)
11/06/2022 [name]   08-13-1933  [id number]  Primary Physician Angelica Chessman, MD Primary Cardiologist: Runell Gess MD FACP, [place], [place], [place]  HPI:  [name] is a 89 y.o.   moderately overweight, recently remarried for the third time (09/30/14) [place] American male, father of 3 children and 4 stepchildren, grandfather to 20 grandchildren, who is formerly a patient of Dr. Donavan Burnet.  I last saw him in the office 04/16/2022.  He is accompanied by his wife [name] today.  He has a history of CAD status post LAD stenting with a Promus drug-eluting stent, October of 2009. He had attempt at VT ablation by Dr. Hillis Range, February 2010; however, this was aborted because of inappropriate substrate. His other problems include hypertension, hyperlipidemia, diabetes, as well as history of prostate cancer. Since I saw him, he has been asymptomatic. His last Myoview performed 04/11/11 was completely normal.    Since I saw him in the office 6 months ago he continues to do well.  He is approaching need for dialysis and recently had attempted peritoneal dialysis catheter placement unsuccessfully because of adhesions.  He tells me that they are going to consider putting an AV fistula and for hemodialysis.  Otherwise, he is fairly active.  He denies chest pain or shortness of breath.  His last 2D echocardiogram performed 08/07/2022 revealed normal LV systolic function, grade 2 diastolic dysfunction with moderate aortic stenosis.  His mean gradient was 20 mmHg with a valve area of 1.09 cm which is fairly stable compared to his prior echo.  He does not give symptoms of severe aortic stenosis at this time.     Current Meds  Medication Sig   acetaminophen (TYLENOL) 500 MG tablet Take 1,000 mg by mouth in the morning.   amLODipine (NORVASC) 10 MG tablet TAKE 1 TABLET EVERY DAY (APPOINTMENT IS NEEDED FOR REFILLS)   atorvastatin (LIPITOR) 10 MG tablet TAKE 1 TABLET EVERY DAY   Blood Glucose  Monitoring Suppl (ACCU-CHEK AVIVA PLUS) w/Device KIT Test blood sugar 3 times daily. Dx code: E11.22   Blood Pressure Monitoring (BLOOD PRESS MONITOR/M-L CUFF) MISC 1 application by Does not apply route daily.   calcitRIOL (ROCALTROL) 0.25 MCG capsule Take 0.25 mcg by mouth 3 (three) times a week.   carvedilol (COREG) 25 MG tablet TAKE 1 TABLET TWICE DAILY. KEEP OFFICE VISIT   Cholecalciferol (VITAMIN D-3 PO) Take 1 tablet by mouth daily.   Continuous Blood Gluc Sensor (FREESTYLE LIBRE SENSOR SYSTEM) MISC 1 application by Does not apply route daily.   Continuous Glucose Monitor Sup KIT 1 kit by Does not apply route as directed.   furosemide (LASIX) 40 MG tablet TAKE 1 TABLET TWICE DAILY (Patient taking differently: Take 40 mg by mouth daily.)   glucose blood (ACCU-CHEK AVIVA PLUS) test strip Test blood sugar 3 times daily. Dx code: E11.22   insulin NPH-regular Human (NOVOLIN 70/30) (70-30) 100 UNIT/ML injection Inject 11 Units into the skin 2 (two) times daily with a meal. (Patient taking differently: Inject 8 Units into the skin in the morning and at bedtime.)   Insulin Syringe-Needle U-100 (B-D INS SYR HALF-UNIT .3CC/31G) 31G X 5/16" 0.3 ML MISC Use daily at bedtime to inject insulin. Dx code: 250.00   isosorbide mononitrate (IMDUR) 60 MG 24 hr tablet Take 2 tablets (120 mg total) by mouth daily.   Lancets (ACCU-CHEK MULTICLIX) lancets Test blood sugar 3 times daily. Dx code: E11.22   Multiple Vitamin (MULTIVITAMIN) tablet Take  1 tablet by mouth daily.   Omega-3 Fatty Acids (FISH OIL PO) Take 1 capsule by mouth daily.   tamsulosin (FLOMAX) 0.4 MG CAPS capsule Take 0.4 mg by mouth daily.     No Known Allergies  Social History   Socioeconomic History   Marital status: Married    Spouse name: Not on file   Number of children: 7   Years of education: Not on file   Highest education level: Not on file  Occupational History   Occupation: RETIRED    Employer: RETIRED  Tobacco Use   Smoking  status: Former    Current packs/day: 0.00    Types: Cigarettes    Quit date: 05/10/2002    Years since quitting: 20.5   Smokeless tobacco: Never   Tobacco comments:    Patient smokes occasionally. Not everyday.  Vaping Use   Vaping status: Never Used  Substance and Sexual Activity   Alcohol use: No   Drug use: No   Sexual activity: Never  Other Topics Concern   Not on file  Social History Narrative   Not on file   Social Determinants of Health   Financial Resource Strain: Not on file  Food Insecurity: Not on file  Transportation Needs: Not on file  Physical Activity: Not on file  Stress: Not on file  Social Connections: Unknown (10/02/2022)   [facility], [facility]   Social Network    Social Network: Not on file  Intimate Partner Violence: Unknown (10/02/2022)   Received from [facility], [facility]   HITS    Physically Hurt: Not on file    Insult or Talk Down To: Not on file    Threaten Physical Harm: Not on file    Scream or Curse: Not on file     Review of Systems: General: negative for chills, fever, night sweats or weight changes.  Cardiovascular: negative for chest pain, dyspnea on exertion, edema, orthopnea, palpitations, paroxysmal nocturnal dyspnea or shortness of breath Dermatological: negative for rash Respiratory: negative for cough or wheezing Urologic: negative for hematuria Abdominal: negative for nausea, vomiting, diarrhea, bright red blood per rectum, melena, or hematemesis Neurologic: negative for visual changes, syncope, or dizziness All other systems reviewed and are otherwise negative except as noted above.    Blood pressure 112/64, pulse 71, height 6' (1.829 m), weight 224 lb 12.8 oz (102 kg), SpO2 98%.  General appearance: alert and no distress Neck: no adenopathy, no carotid bruit, no JVD, supple, symmetrical, trachea midline, and thyroid not enlarged, symmetric, no tenderness/mass/nodules Lungs: clear to auscultation  bilaterally Heart: regular rate and rhythm, S1, S2 normal, no murmur, click, rub or gallop Extremities: extremities normal, atraumatic, no cyanosis or edema Pulses: 2+ and symmetric Skin: Skin color, texture, turgor normal. No rashes or lesions Neurologic: Grossly normal  EKG not performed today      ASSESSMENT AND PLAN:   CAD S/P percutaneous coronary angioplasty History of CAD status post LAD stenting performed October 2009 with a Promus drug-eluting stent.  He is remained stable since that time.  He denies chest pain or shortness of breath.  Essential hypertension History of essential hypertension blood pressure measured today at 112/64.  He is on amlodipine, and carvedilol as well as hydralazine.  Dyslipidemia, goal LDL below 70 History of dyslipidemia on statin therapy with lipid profile performed by his PCP 5 months ago revealing total cholesterol of 113, LDL of 57 and HDL of 37.  He is at goal for secondary  prevention.  Lower extremity edema History of lower extremity edema probably related to renal insufficiency and diastolic dysfunction on diuretics.  He appears euvolemic with no edema today.     Runell Gess MD FACP,FACC,FAHA, [facility] 11/06/2022 10:42 AM

## 2022-11-06 NOTE — Assessment & Plan Note (Signed)
History of CAD status post LAD stenting performed October 2009 with a Promus drug-eluting stent.  He is remained stable since that time.  He denies chest pain or shortness of breath.

## 2022-11-13 ENCOUNTER — Other Ambulatory Visit: Payer: Self-pay | Admitting: *Deleted

## 2022-11-13 DIAGNOSIS — N184 Chronic kidney disease, stage 4 (severe): Secondary | ICD-10-CM

## 2022-11-21 NOTE — Progress Notes (Signed)
Patient name: [name] MRN: [id number] DOB: 09/01/1933 Sex: male  REASON FOR CONSULT: Discuss permanent dialysis access   HPI: [name] is a 89 y.o. male, with history of coronary artery disease, CHF, chronic kidney disease stage 5, hypertension, hyperlipidemia, diabetes that presents for evaluation of permanent dialysis access.  Patient is not currently on dialysis at this time.  Is followed by Dr. Malen Gauze with [facility].  He recently underwent attempted PD catheter placement at [facility] and this was unsuccessful due to scar tissue.  Patient is right-handed.  No chest wall implants.  Not on dialysis at this time.  No prior access in his upper extremities.  Past Medical History:  Diagnosis Date   Allergy    CAD (coronary artery disease)    pci to LAD 10/09; stable CAD by cath 05/31/08; myoview 04/11/11- no ishcemia, EF 67%; echo 05/15/09- EF>55%, mod calcification of the aortic valve leaflets   CAD S/P percutaneous coronary angioplasty 05/08/2008   LAD PCI with DES 2009- Myoview low risk 2013   CHF (congestive heart failure) (HCC) 09/07/2019   Chronic back pain    Chronic kidney disease    Cystitis, radiation 12/01/2013   ED (erectile dysfunction)    Elevated serum creatinine 12/14/2015   Essential hypertension, benign    Hematuria 12/01/2013   Hyperlipidemia    Ileus (HCC) 12/14/2015   Insulin dependent type 2 diabetes mellitus, controlled (HCC)    Malignant neoplasm of prostate (HCC) 11/26/2013   Multiple rib fractures    left 9th, 10th, and 11th posterior rib fractures S/P fall 12/09/2015/notes 12/12/2015   Osteoarthritis of right knee 03/04/2018   Personal history of COVID-19 09/05/2021   PREMATURE VENTRICULAR CONTRACTIONS 05/08/2008   Qualifier: Diagnosis of  By: Johney Frame, MD, James     Prostate cancer [facility])    s/p   Rectal bleeding 12/23/2012   Evaluated by Dr Elnoria Howard GI patient with history of radiation proctitis. Patient due for colonoscopy on 04/2014    Type II or  unspecified type diabetes mellitus without mention of complication, not stated as uncontrolled    Ventricular tachycardia (HCC)    resuscitated; monitor 05/2008; attempted T ablation 2/10- aborted due to inappropriate substrate    Past Surgical History:  Procedure Laterality Date   CARDIAC CATHETERIZATION  05/31/08   EF 55-60%, stable lesions, medical therapy   COLECTOMY  '80's   polyps   CORONARY ANGIOPLASTY WITH STENT PLACEMENT  01/19/08   PCI stent to mid LAD with Promus DES 27.5x28   CYSTOSCOPY WITH DIRECT VISION INTERNAL URETHROTOMY [name] 03/12/2021   Procedure: cystoscoopy withdirect vision internal urethrotomy optilum urethral dilation     ;  Surgeon: Crista Elliot, MD;  Location: [facility];  Service: Urology;  Laterality: [name];  RERQUESTING 45 MINS   IR GENERIC HISTORICAL  01/03/2016   IR THORACENTESIS ASP PLEURAL SPACE W/IMG GUIDE 01/03/2016 [facility]-INTERV RAD   KNEE ARTHROPLASTY Right 03/04/2018   Procedure: COMPUTER ASSISTED TOTAL KNEE ARTHROPLASTY;  Surgeon: Samson Frederic, MD;  Location: [facility];  Service: Orthopedics;  Laterality: Right;   PTCA  01/2008   TRANSURETHRAL RESECTION OF BLADDER TUMOR [name] 11/10/2020   Procedure: Caprice Kluver;  Surgeon: Malen Gauze, MD;  Location: [facility];  Service: Urology;  Laterality: [name];   TRANSURETHRAL RESECTION OF BLADDER TUMOR [name] 08/12/2022   Procedure: TRANSURETHRAL RESECTION OF BLADDER TUMOR (TURBT);  Surgeon: Crista Elliot, MD;  Location: [facility];  Service: Urology;  Laterality: [name];  TRANSURETHRAL RESECTION OF BLADDER TUMOR WITH MITOMYCIN-C [name] 09/17/2021   Procedure: TRANSURETHRAL RESECTION OF BLADDER TUMOR WITH GEMCITABINE;  Surgeon: Crista Elliot, MD;  Location: [facility];  Service: Urology;  Laterality: [name];    Family History  Problem Relation Age of Onset   Diabetes Mother    Heart attack Mother    Leukemia Father    Heart disease Sister    Heart disease Brother    Diabetes Brother         pancreatic cancer    SOCIAL HISTORY: Social History   Socioeconomic History   Marital status: Married    Spouse name: Not on file   Number of children: 7   Years of education: Not on file   Highest education level: Not on file  Occupational History   Occupation: RETIRED    Employer: RETIRED  Tobacco Use   Smoking status: Former    Current packs/day: 0.00    Types: Cigarettes    Quit date: 05/10/2002    Years since quitting: 20.5   Smokeless tobacco: Never   Tobacco comments:    Patient smokes occasionally. Not everyday.  Vaping Use   Vaping status: Never Used  Substance and Sexual Activity   Alcohol use: No   Drug use: No   Sexual activity: Never  Other Topics Concern   Not on file  Social History Narrative   Not on file   Social Determinants of Health   Financial Resource Strain: Not on file  Food Insecurity: Not on file  Transportation Needs: Not on file  Physical Activity: Not on file  Stress: Not on file  Social Connections: Unknown (10/02/2022)   [facility], [facility]   Social Network    Social Network: Not on file  Intimate Partner Violence: Unknown (10/02/2022)   [facility], [facility]   HITS    Physically Hurt: Not on file    Insult or Talk Down To: Not on file    Threaten Physical Harm: Not on file    Scream or Curse: Not on file    No Known Allergies  Current Outpatient Medications  Medication Sig Dispense Refill   acetaminophen (TYLENOL) 500 MG tablet Take 1,000 mg by mouth in the morning.     amLODipine (NORVASC) 10 MG tablet TAKE 1 TABLET EVERY DAY (APPOINTMENT IS NEEDED FOR REFILLS) 90 tablet 3   atorvastatin (LIPITOR) 10 MG tablet TAKE 1 TABLET EVERY DAY 90 tablet 3   Blood Glucose Monitoring Suppl (ACCU-CHEK AVIVA PLUS) w/Device KIT Test blood sugar 3 times daily. Dx code: E11.22 1 kit 0   Blood Pressure Monitoring (BLOOD PRESS MONITOR/M-L CUFF) MISC 1 application by Does not apply route daily. 1 each  0   calcitRIOL (ROCALTROL) 0.25 MCG capsule Take 0.25 mcg by mouth 3 (three) times a week.     carvedilol (COREG) 25 MG tablet TAKE 1 TABLET TWICE DAILY. KEEP OFFICE VISIT 180 tablet 3   Cholecalciferol (VITAMIN D-3 PO) Take 1 tablet by mouth daily.     Continuous Blood Gluc Sensor (FREESTYLE LIBRE SENSOR SYSTEM) MISC 1 application by Does not apply route daily. 1 each 2   Continuous Glucose Monitor Sup KIT 1 kit by Does not apply route as directed. 1 kit 0   furosemide (LASIX) 40 MG tablet TAKE 1 TABLET TWICE DAILY (Patient taking differently: Take 40 mg by mouth daily.) 180 tablet 10   glucose blood (ACCU-CHEK AVIVA PLUS) test strip Test blood sugar 3  times daily. Dx code: E11.22 300 each 3   hydrALAZINE (APRESOLINE) 50 MG tablet Take 50 mg by mouth in the morning and at bedtime. (Patient not taking: Reported on 11/06/2022)     HYDROcodone-acetaminophen (NORCO) 5-325 MG tablet Take 1 tablet by mouth every 4 (four) hours as needed for moderate pain. (Patient not taking: Reported on 11/06/2022) 6 tablet 0   insulin NPH-regular Human (NOVOLIN 70/30) (70-30) 100 UNIT/ML injection Inject 11 Units into the skin 2 (two) times daily with a meal. (Patient taking differently: Inject 8 Units into the skin in the morning and at bedtime.) 15 mL 5   Insulin Syringe-Needle U-100 (B-D INS SYR HALF-UNIT .3CC/31G) 31G X 5/16" 0.3 ML MISC Use daily at bedtime to inject insulin. Dx code: 250.00 100 each 3   isosorbide mononitrate (IMDUR) 60 MG 24 hr tablet Take 2 tablets (120 mg total) by mouth daily. 180 tablet 2   Lancets (ACCU-CHEK MULTICLIX) lancets Test blood sugar 3 times daily. Dx code: E11.22 300 each 3   Multiple Vitamin (MULTIVITAMIN) tablet Take 1 tablet by mouth daily.     Omega-3 Fatty Acids (FISH OIL PO) Take 1 capsule by mouth daily.     tamsulosin (FLOMAX) 0.4 MG CAPS capsule Take 0.4 mg by mouth daily.     No current facility-administered medications for this visit.    REVIEW OF SYSTEMS:  [X]   denotes positive finding, [ ]  denotes negative finding Cardiac  Comments:  Chest pain or chest pressure:    Shortness of breath upon exertion:    Short of breath when lying flat:    Irregular heart rhythm:        Vascular    Pain in calf, thigh, or hip brought on by ambulation:    Pain in feet at night that wakes you up from your sleep:     Blood clot in your veins:    Leg swelling:         Pulmonary    Oxygen at home:    Productive cough:     Wheezing:         Neurologic    Sudden weakness in arms or legs:     Sudden numbness in arms or legs:     Sudden onset of difficulty speaking or slurred speech:    Temporary loss of vision in one eye:     Problems with dizziness:         Gastrointestinal    Blood in stool:     Vomited blood:         Genitourinary    Burning when urinating:     Blood in urine:        Psychiatric    Major depression:         Hematologic    Bleeding problems:    Problems with blood clotting too easily:        Skin    Rashes or ulcers:        Constitutional    Fever or chills:      PHYSICAL EXAM: There were no vitals filed for this visit.  GENERAL: The patient is a well-nourished male, in no acute distress. The vital signs are documented above. CARDIAC: There is a regular rate and rhythm.  VASCULAR:  Bilateral radial and brachial pulses palpable upper extremities No chest wall implants PULMONARY:No respiratory distress. ABDOMEN: Soft and non-tender. MUSCULOSKELETAL: There are no major deformities or cyanosis. NEUROLOGIC: No focal weakness or paresthesias are detected. SKIN: There are  no ulcers or rashes noted. PSYCHIATRIC: The patient has a normal affect.  DATA:   Upper extremity vein mapping shows usable cephalic vein in the left arm  Assessment/Plan:  89 y.o. male, with history of coronary artery disease, CHF, chronic kidney disease stage 5, hypertension, hyperlipidemia, diabetes that presents for evaluation of permanent  hemodialysis access.  Discussed dialysis access placement in the nondominant arm which would be his left arm.  I discussed he has a good cephalic vein based on vein mapping.  I discussed indications for access placement are for stage IV and V kidney disease which he would qualify.  I discussed this being done at [facility] as an outpatient.  I discussed fistula taking 3 months to mature.  Discussed we would evaluate with ultrasound in the operating room to decide if this would be at the wrist with a radiocephalic or higher at the elbow with a brachiocephalic.  Risk benefits discussed including risk of bleeding, infection, nonhealing, steal syndrome, failure to mature.  As noted in the HPI he recently underwent attempted PD catheter placement at [facility] and this was unsuccessful due to reported scar tissue.  Will get him scheduled for left arm AV fistula at his convenience.   [name], MD [facility] Office: [phone]

## 2022-11-22 ENCOUNTER — Ambulatory Visit (HOSPITAL_COMMUNITY)
Admission: RE | Admit: 2022-11-22 | Discharge: 2022-11-22 | Disposition: A | Discharge status: Home / Self Care | Payer: Medicare HMO | Source: Ambulatory Visit | Attending: Vascular Surgery | Admitting: Vascular Surgery

## 2022-11-22 ENCOUNTER — Encounter: Payer: Self-pay | Admitting: Vascular Surgery

## 2022-11-22 ENCOUNTER — Ambulatory Visit (INDEPENDENT_AMBULATORY_CARE_PROVIDER_SITE_OTHER)
Admission: RE | Admit: 2022-11-22 | Discharge: 2022-11-22 | Disposition: A | Discharge status: Home / Self Care | Payer: Medicare HMO | Source: Ambulatory Visit | Attending: Vascular Surgery | Admitting: Vascular Surgery

## 2022-11-22 ENCOUNTER — Ambulatory Visit: Payer: Medicare HMO | Admitting: Vascular Surgery

## 2022-11-22 VITALS — BP 183/88 | HR 68 | Temp 98.1°F | Resp 18 | Ht 72.0 in | Wt 221.4 lb

## 2022-11-22 DIAGNOSIS — N184 Chronic kidney disease, stage 4 (severe): Secondary | ICD-10-CM | POA: Insufficient documentation

## 2022-11-22 DIAGNOSIS — N185 Chronic kidney disease, stage 5: Secondary | ICD-10-CM | POA: Insufficient documentation

## 2022-11-27 ENCOUNTER — Other Ambulatory Visit: Payer: Self-pay

## 2022-11-27 DIAGNOSIS — N185 Chronic kidney disease, stage 5: Secondary | ICD-10-CM

## 2022-11-28 ENCOUNTER — Other Ambulatory Visit: Payer: Self-pay

## 2022-11-28 ENCOUNTER — Encounter (HOSPITAL_COMMUNITY): Payer: Self-pay | Admitting: Vascular Surgery

## 2022-11-28 NOTE — Progress Notes (Addendum)
[name] asked me to call his daughter [name], she has his history and will take instructions. I spoke with [name] who reported that [name] has not had any complaints of chest pain or shortness of breath. Patient has not have  any s/s of Covid and no s/s of upper or lower respiratory infection in the past 8 weeks.    [name] PCP is Dr. Georgiann Mccoy, cardiologist is Dr. Allyson Sabal.  Patient sees [name] at [facility] on 11/27/22, he was started on Augmentin for UTI.  Anesthesia PA-C requested to Chart sent to PA- C for anesthesia.

## 2022-11-29 NOTE — Progress Notes (Signed)
Anesthesia Chart Review: Same-day workup  Follows with cardiology for history of CAD s/p DES to LAD 2009, diastolic heart failure, moderate aortic stenosis (mean gradient 28 mmHg by echo 08/2022) HTN, HLD, NSVT s/p attempted ablation 2010.  Echo 08/2022 showed EF 65%, grade 2 DD, moderate AS with mean gradient 28 mmHg.  Last seen by Dr. Allyson Sabal 11/06/2022, stable at that time, no changes to management.  Patient was previously cleared for surgery per progress note from Jari Favre, [place] on 10/18/2022, "Mr. Ackroyd's perioperative risk of a major cardiac event is 11% according to the Revised Cardiac Risk Index (RCRI).  Therefore, he is at high risk for perioperative complications.   His functional capacity is fair at 4.73 METs according to the Duke Activity Status Index (DASI). Recommendations: According to ACC/AHA guidelines, no further cardiovascular testing needed.  The patient may proceed to surgery at acceptable risk."  IDDM2, last A1c 7.0 on 11/21/2022.  CKD 5 not yet on dialysis.  Recently underwent attempted PD catheter placement at [facility] which was unsuccessful due to adhesions.  Patient will need day of surgery labs and evaluation.  EKG 08/05/2022: Sinus rhythm with frequent PVCs.  Rate 77.  TTE 08/07/2022: 1. Left ventricular ejection fraction, by estimation, is 60 to 65%. The  left ventricle has normal function. The left ventricle has no regional  wall motion abnormalities. There is mild left ventricular hypertrophy.  Left ventricular diastolic parameters  are consistent with Grade II diastolic dysfunction (pseudonormalization).   2. Right ventricular systolic function is normal. The right ventricular  size is normal. There is mildly elevated pulmonary artery systolic  pressure.   3. Left atrial size was mildly dilated.   4. Right atrial size was mildly dilated.   5. The mitral valve is normal in structure. No evidence of mitral valve  regurgitation. No evidence of mitral stenosis.   6. DI -  0.35, SVi 36. The aortic valve is calcified. There is moderate  calcification of the aortic valve. There is moderate thickening of the  aortic valve. Aortic valve regurgitation is mild. Moderate aortic valve  stenosis. Aortic valve area, by VTI  measures 1.09 cm. Aortic valve mean gradient measures 28.0 mmHg.   7. The inferior vena cava is normal in size with greater than 50%  respiratory variability, suggesting right atrial pressure of 3 mmHg.     Zannie Cove [facility]/Anesthesiology Phone [phone] 11/29/2022 12:49 PM

## 2022-11-29 NOTE — Anesthesia Preprocedure Evaluation (Signed)
Anesthesia Evaluation  Patient identified by MRN, date of birth, ID band Patient awake    Reviewed: Allergy & Precautions, NPO status , Patient's Chart, lab work & pertinent test results, reviewed documented beta blocker date and time   History of Anesthesia Complications Negative for: history of anesthetic complications  Airway Mallampati: I  TM Distance: >3 FB Neck ROM: Full    Dental  (+) Edentulous Lower, Lower Dentures, Upper Dentures, Edentulous Upper, Dental Advisory Given   Pulmonary neg shortness of breath, neg sleep apnea, neg COPD, neg recent URI, former smoker   breath sounds clear to auscultation       Cardiovascular hypertension (180/80s), Pt. on medications and Pt. on home beta blockers (-) angina + CAD, + Cardiac Stents (DES to LAD 2009) and +CHF (normal LVEF, grade 2 diastolic dysfunction)  (-) Past MI + dysrhythmias (NSVT s/p attempted ablation 2010) Supra Ventricular Tachycardia + Valvular Problems/Murmurs (mild AI, mod AS) AI and AS  Rhythm:Regular  Echo 08/2022  1. Left ventricular ejection fraction, by estimation, is 60 to 65%. The left ventricle has normal function. The left ventricle has no regional wall motion abnormalities. There is mild left ventricular hypertrophy. Left ventricular diastolic parameters are consistent with Grade II diastolic dysfunction (pseudonormalization).  2. Right ventricular systolic function is normal. The right ventricular size is normal. There is mildly elevated pulmonary artery systolic pressure.  3. Left atrial size was mildly dilated.  4. Right atrial size was mildly dilated.  5. The mitral valve is normal in structure. No evidence of mitral valve regurgitation. No evidence of mitral stenosis.  6. DI - 0.35, SVi 36. The aortic valve is calcified. There is moderate calcification of the aortic valve. There is moderate thickening of the aortic valve. Aortic valve regurgitation is  mild. Moderate aortic valve stenosis. Aortic valve area, by VTI measures 1.09 cm. Aortic valve mean gradient measures 28.0 mmHg.  7. The inferior vena cava is normal in size with greater than 50% respiratory variability, suggesting right atrial pressure of 3 mmHg.         Neuro/Psych negative neurological ROS  negative psych ROS   GI/Hepatic negative GI ROS, Neg liver ROS,,,  Endo/Other  diabetes, Well Controlled, Type 2, Insulin Dependent  A1c 7.0  Renal/GU CRFRenal diseaseNot yet on HD, recently attempted placement at [facility] which was unsuccessful due to adhesions.  negative genitourinary   Musculoskeletal  (+) Arthritis , Osteoarthritis,    Abdominal   Peds  Hematology  (+) Blood dyscrasia, anemia Lab Results      Component                Value               Date                      WBC                      7.5                 07/17/2022                HGB                      11.2 (L)            12/02/2022                HCT  33.0 (L)            12/02/2022                MCV                      84.5                07/17/2022                PLT                      235                 07/17/2022              Anesthesia Other Findings   Reproductive/Obstetrics                             Anesthesia Physical Anesthesia Plan  ASA: 4  Anesthesia Plan: MAC and Regional   Post-op Pain Management: Regional block* and Tylenol PO (pre-op)*   Induction:   PONV Risk Score and Plan: 2 and Propofol infusion and TIVA  Airway Management Planned: Natural Airway and Simple Face Mask  Additional Equipment: None  Intra-op Plan:   Post-operative Plan:   Informed Consent: I have reviewed the patients History and Physical, chart, labs and discussed the procedure including the risks, benefits and alternatives for the proposed anesthesia with the patient or authorized representative who has indicated his/her understanding and  acceptance.     Dental advisory given  Plan Discussed with: Anesthesiologist  Anesthesia Plan Comments:         Anesthesia Quick Evaluation

## 2022-12-02 ENCOUNTER — Encounter (HOSPITAL_COMMUNITY)
Admission: RE | Disposition: A | Discharge status: Home / Self Care | Payer: Self-pay | Source: Home / Self Care | Attending: Vascular Surgery

## 2022-12-02 ENCOUNTER — Ambulatory Visit (HOSPITAL_BASED_OUTPATIENT_CLINIC_OR_DEPARTMENT_OTHER): Payer: Medicare HMO | Admitting: Physician Assistant

## 2022-12-02 ENCOUNTER — Telehealth: Payer: Self-pay | Admitting: Vascular Surgery

## 2022-12-02 ENCOUNTER — Ambulatory Visit (HOSPITAL_COMMUNITY): Payer: Medicare HMO | Admitting: Physician Assistant

## 2022-12-02 ENCOUNTER — Other Ambulatory Visit: Payer: Self-pay

## 2022-12-02 ENCOUNTER — Ambulatory Visit (HOSPITAL_COMMUNITY)
Admission: RE | Admit: 2022-12-02 | Discharge: 2022-12-02 | Disposition: A | Discharge status: Home / Self Care | Payer: Medicare HMO | Source: Home / Self Care | Attending: Vascular Surgery | Admitting: Vascular Surgery

## 2022-12-02 ENCOUNTER — Encounter (HOSPITAL_COMMUNITY): Payer: Self-pay | Admitting: Vascular Surgery

## 2022-12-02 DIAGNOSIS — I130 Hypertensive heart and chronic kidney disease with heart failure and stage 1 through stage 4 chronic kidney disease, or unspecified chronic kidney disease: Secondary | ICD-10-CM | POA: Diagnosis not present

## 2022-12-02 DIAGNOSIS — Z79899 Other long term (current) drug therapy: Secondary | ICD-10-CM | POA: Diagnosis not present

## 2022-12-02 DIAGNOSIS — Z8249 Family history of ischemic heart disease and other diseases of the circulatory system: Secondary | ICD-10-CM | POA: Diagnosis not present

## 2022-12-02 DIAGNOSIS — N1832 Chronic kidney disease, stage 3b: Secondary | ICD-10-CM | POA: Diagnosis not present

## 2022-12-02 DIAGNOSIS — E1122 Type 2 diabetes mellitus with diabetic chronic kidney disease: Secondary | ICD-10-CM | POA: Insufficient documentation

## 2022-12-02 DIAGNOSIS — M1990 Unspecified osteoarthritis, unspecified site: Secondary | ICD-10-CM | POA: Insufficient documentation

## 2022-12-02 DIAGNOSIS — Z955 Presence of coronary angioplasty implant and graft: Secondary | ICD-10-CM | POA: Insufficient documentation

## 2022-12-02 DIAGNOSIS — I2510 Atherosclerotic heart disease of native coronary artery without angina pectoris: Secondary | ICD-10-CM | POA: Insufficient documentation

## 2022-12-02 DIAGNOSIS — Z833 Family history of diabetes mellitus: Secondary | ICD-10-CM | POA: Diagnosis not present

## 2022-12-02 DIAGNOSIS — E785 Hyperlipidemia, unspecified: Secondary | ICD-10-CM | POA: Insufficient documentation

## 2022-12-02 DIAGNOSIS — N185 Chronic kidney disease, stage 5: Secondary | ICD-10-CM | POA: Diagnosis not present

## 2022-12-02 DIAGNOSIS — I358 Other nonrheumatic aortic valve disorders: Secondary | ICD-10-CM | POA: Diagnosis not present

## 2022-12-02 DIAGNOSIS — I509 Heart failure, unspecified: Secondary | ICD-10-CM | POA: Diagnosis not present

## 2022-12-02 DIAGNOSIS — Z87891 Personal history of nicotine dependence: Secondary | ICD-10-CM | POA: Insufficient documentation

## 2022-12-02 DIAGNOSIS — L905 Scar conditions and fibrosis of skin: Secondary | ICD-10-CM | POA: Diagnosis not present

## 2022-12-02 DIAGNOSIS — I132 Hypertensive heart and chronic kidney disease with heart failure and with stage 5 chronic kidney disease, or end stage renal disease: Secondary | ICD-10-CM | POA: Insufficient documentation

## 2022-12-02 DIAGNOSIS — Z794 Long term (current) use of insulin: Secondary | ICD-10-CM | POA: Diagnosis not present

## 2022-12-02 DIAGNOSIS — I471 Supraventricular tachycardia, unspecified: Secondary | ICD-10-CM | POA: Insufficient documentation

## 2022-12-02 HISTORY — PX: AV FISTULA PLACEMENT: SHX1204

## 2022-12-02 HISTORY — DX: Personal history of other medical treatment: Z92.89

## 2022-12-02 LAB — POCT I-STAT, CHEM 8
BUN: 75 mg/dL — ABNORMAL HIGH (ref 8–23)
Calcium, Ion: 1.19 mmol/L (ref 1.15–1.40)
Chloride: 114 mmol/L — ABNORMAL HIGH (ref 98–111)
Creatinine, Ser: 6.4 mg/dL — ABNORMAL HIGH (ref 0.61–1.24)
Glucose, Bld: 119 mg/dL — ABNORMAL HIGH (ref 70–99)
HCT: 33 % — ABNORMAL LOW (ref 39.0–52.0)
Hemoglobin: 11.2 g/dL — ABNORMAL LOW (ref 13.0–17.0)
Potassium: 5.1 mmol/L (ref 3.5–5.1)
Sodium: 141 mmol/L (ref 135–145)
TCO2: 16 mmol/L — ABNORMAL LOW (ref 22–32)

## 2022-12-02 LAB — GLUCOSE, CAPILLARY
Glucose-Capillary: 118 mg/dL — ABNORMAL HIGH (ref 70–99)
Glucose-Capillary: 98 mg/dL (ref 70–99)
Glucose-Capillary: 155 mg/dL — ABNORMAL HIGH (ref 70–99)

## 2022-12-02 SURGERY — ARTERIOVENOUS (AV) FISTULA CREATION
Anesthesia: Monitor Anesthesia Care | Laterality: Left

## 2022-12-02 MED ORDER — HEPARIN SODIUM (PORCINE) 1000 UNIT/ML IJ SOLN
INTRAMUSCULAR | Status: DC | PRN
  Administered 2022-12-02: 3000 [IU] via INTRAVENOUS

## 2022-12-02 MED ORDER — EPHEDRINE 5 MG/ML INJ
INTRAVENOUS | Status: AC
  Filled 2022-12-02: qty 5

## 2022-12-02 MED ORDER — FENTANYL CITRATE (PF) 100 MCG/2ML IJ SOLN: 25.0000 ug | INTRAMUSCULAR | Status: DC | PRN

## 2022-12-02 MED ORDER — PHENYLEPHRINE 80 MCG/ML (10ML) SYRINGE FOR IV PUSH (FOR BLOOD PRESSURE SUPPORT)
PREFILLED_SYRINGE | INTRAVENOUS | Status: AC
  Filled 2022-12-02: qty 10

## 2022-12-02 MED ORDER — HEPARIN 6000 UNIT IRRIGATION SOLUTION
Status: AC
  Filled 2022-12-02: qty 500

## 2022-12-02 MED ORDER — LIDOCAINE-EPINEPHRINE (PF) 1.5 %-1:200000 IJ SOLN
INTRAMUSCULAR | Status: DC | PRN
  Administered 2022-12-02: 19 mL via PERINEURAL

## 2022-12-02 MED ORDER — ONDANSETRON HCL 4 MG/2ML IJ SOLN
INTRAMUSCULAR | Status: AC
  Filled 2022-12-02: qty 2

## 2022-12-02 MED ORDER — OXYCODONE HCL 5 MG PO TABS: 5.0000 mg | ORAL_TABLET | Freq: Once | ORAL | Status: DC | PRN

## 2022-12-02 MED ORDER — PROPOFOL 10 MG/ML IV BOLUS
INTRAVENOUS | Status: AC
  Filled 2022-12-02: qty 20

## 2022-12-02 MED ORDER — FENTANYL CITRATE (PF) 250 MCG/5ML IJ SOLN
INTRAMUSCULAR | Status: AC
  Filled 2022-12-02: qty 5

## 2022-12-02 MED ORDER — OXYCODONE HCL 5 MG/5ML PO SOLN: 5.0000 mg | Freq: Once | ORAL | Status: DC | PRN

## 2022-12-02 MED ORDER — SODIUM CHLORIDE 0.9 % IV SOLN: INTRAVENOUS | Status: DC

## 2022-12-02 MED ORDER — CALCIUM CHLORIDE 10 % IV SOLN
INTRAVENOUS | Status: AC
  Filled 2022-12-02: qty 10

## 2022-12-02 MED ORDER — CHLORHEXIDINE GLUCONATE 0.12 % MT SOLN
15.0000 mL | Freq: Once | OROMUCOSAL | Status: AC
  Administered 2022-12-02: 15 mL via OROMUCOSAL
  Filled 2022-12-02: qty 15

## 2022-12-02 MED ORDER — CEFAZOLIN SODIUM-DEXTROSE 2-4 GM/100ML-% IV SOLN
2.0000 g | INTRAVENOUS | Status: AC
  Administered 2022-12-02: 2 g via INTRAVENOUS
  Filled 2022-12-02: qty 100

## 2022-12-02 MED ORDER — ACETAMINOPHEN 500 MG PO TABS
1000.0000 mg | ORAL_TABLET | Freq: Once | ORAL | Status: AC
  Administered 2022-12-02: 1000 mg via ORAL
  Filled 2022-12-02: qty 2

## 2022-12-02 MED ORDER — VASOPRESSIN 20 UNITS/100 ML INFUSION FOR SHOCK
INTRAVENOUS | Status: DC | PRN
  Administered 2022-12-02: .02 [IU]/min via INTRAVENOUS

## 2022-12-02 MED ORDER — HEPARIN 6000 UNIT IRRIGATION SOLUTION
Status: DC | PRN
  Administered 2022-12-02: 1

## 2022-12-02 MED ORDER — PROPOFOL 500 MG/50ML IV EMUL
INTRAVENOUS | Status: DC | PRN
  Administered 2022-12-02: 80 ug/kg/min via INTRAVENOUS

## 2022-12-02 MED ORDER — VASOPRESSIN 20 UNIT/ML IV SOLN
INTRAVENOUS | Status: DC | PRN
  Administered 2022-12-02: 1 [IU] via INTRAVENOUS
  Administered 2022-12-02 (×4): .5 [IU] via INTRAVENOUS

## 2022-12-02 MED ORDER — PROPOFOL 1000 MG/100ML IV EMUL
INTRAVENOUS | Status: AC
  Filled 2022-12-02: qty 100

## 2022-12-02 MED ORDER — ONDANSETRON HCL 4 MG/2ML IJ SOLN
INTRAMUSCULAR | Status: DC | PRN
  Administered 2022-12-02: 4 mg via INTRAVENOUS

## 2022-12-02 MED ORDER — LACTATED RINGERS IV SOLN: INTRAVENOUS | Status: DC

## 2022-12-02 MED ORDER — 0.9 % SODIUM CHLORIDE (POUR BTL) OPTIME
TOPICAL | Status: DC | PRN
  Administered 2022-12-02: 1000 mL

## 2022-12-02 MED ORDER — CHLORHEXIDINE GLUCONATE 4 % EX SOLN: 60.0000 mL | Freq: Once | CUTANEOUS | Status: DC

## 2022-12-02 MED ORDER — EPHEDRINE SULFATE-NACL 50-0.9 MG/10ML-% IV SOSY
PREFILLED_SYRINGE | INTRAVENOUS | Status: DC | PRN
  Administered 2022-12-02 (×2): 15 mg via INTRAVENOUS
  Administered 2022-12-02 (×2): 10 mg via INTRAVENOUS

## 2022-12-02 MED ORDER — FENTANYL CITRATE (PF) 250 MCG/5ML IJ SOLN
INTRAMUSCULAR | Status: DC | PRN
  Administered 2022-12-02: 75 ug via INTRAVENOUS
  Administered 2022-12-02: 25 ug via INTRAVENOUS

## 2022-12-02 MED ORDER — LIDOCAINE HCL (PF) 1 % IJ SOLN
INTRAMUSCULAR | Status: AC
  Filled 2022-12-02: qty 30

## 2022-12-02 MED ORDER — LIDOCAINE 2% (20 MG/ML) 5 ML SYRINGE
INTRAMUSCULAR | Status: AC
  Filled 2022-12-02: qty 5

## 2022-12-02 MED ORDER — ONDANSETRON HCL 4 MG/2ML IJ SOLN: 4.0000 mg | Freq: Once | INTRAMUSCULAR | Status: DC | PRN

## 2022-12-02 MED ORDER — INSULIN ASPART 100 UNIT/ML IJ SOLN: 0.0000 [IU] | INTRAMUSCULAR | Status: DC | PRN

## 2022-12-02 MED ORDER — ORAL CARE MOUTH RINSE: 15.0000 mL | Freq: Once | OROMUCOSAL | Status: AC

## 2022-12-02 MED ORDER — OXYCODONE-ACETAMINOPHEN 5-325 MG PO TABS: 1.0000 | ORAL_TABLET | ORAL | 0 refills | Status: DC | PRN

## 2022-12-02 MED ORDER — HEPARIN SODIUM (PORCINE) 1000 UNIT/ML IJ SOLN
INTRAMUSCULAR | Status: AC
  Filled 2022-12-02: qty 10

## 2022-12-02 MED ORDER — CALCIUM CHLORIDE 10 % IV SOLN
INTRAVENOUS | Status: DC | PRN
  Administered 2022-12-02: 200 mg via INTRAVENOUS

## 2022-12-02 MED ORDER — PHENYLEPHRINE 80 MCG/ML (10ML) SYRINGE FOR IV PUSH (FOR BLOOD PRESSURE SUPPORT)
PREFILLED_SYRINGE | INTRAVENOUS | Status: DC | PRN
  Administered 2022-12-02: 80 ug via INTRAVENOUS
  Administered 2022-12-02: 160 ug via INTRAVENOUS
  Administered 2022-12-02 (×2): 80 ug via INTRAVENOUS
  Administered 2022-12-02: 160 ug via INTRAVENOUS
  Administered 2022-12-02: 80 ug via INTRAVENOUS

## 2022-12-02 MED ORDER — MEPIVACAINE HCL (PF) 2 % IJ SOLN
INTRAMUSCULAR | Status: DC | PRN
  Administered 2022-12-02: 8 mL

## 2022-12-02 MED ORDER — LIDOCAINE 2% (20 MG/ML) 5 ML SYRINGE
INTRAMUSCULAR | Status: DC | PRN
  Administered 2022-12-02: 30 mg via INTRAVENOUS

## 2022-12-02 SURGICAL SUPPLY — 36 items
ADH SKN CLS APL DERMABOND .7 (GAUZE/BANDAGES/DRESSINGS) ×1
AGENT HMST SPONGE THK3/8 (HEMOSTASIS)
ARMBAND PINK RESTRICT EXTREMIT (MISCELLANEOUS) ×2 IMPLANT
BAG COUNTER SPONGE SURGICOUNT (BAG) ×1 IMPLANT
BAG SPNG CNTER NS LX DISP (BAG)
BLADE CLIPPER SURG (BLADE) ×1 IMPLANT
CANISTER SUCT 3000ML PPV (MISCELLANEOUS) ×1 IMPLANT
CLIP TI MEDIUM 6 (CLIP) ×1 IMPLANT
CLIP TI WIDE RED SMALL 6 (CLIP) ×1 IMPLANT
COVER PROBE W GEL 5X96 (DRAPES) ×1 IMPLANT
DERMABOND ADVANCED .7 DNX12 (GAUZE/BANDAGES/DRESSINGS) ×1 IMPLANT
ELECT REM PT RETURN 9FT ADLT (ELECTROSURGICAL) ×1
ELECTRODE REM PT RTRN 9FT ADLT (ELECTROSURGICAL) ×1 IMPLANT
GLOVE BIO SURGEON STRL SZ7.5 (GLOVE) ×1 IMPLANT
GLOVE BIOGEL PI IND STRL 8 (GLOVE) ×1 IMPLANT
GOWN STRL REUS W/ TWL LRG LVL3 (GOWN DISPOSABLE) ×2 IMPLANT
GOWN STRL REUS W/ TWL XL LVL3 (GOWN DISPOSABLE) ×2 IMPLANT
GOWN STRL REUS W/TWL LRG LVL3 (GOWN DISPOSABLE) ×2
GOWN STRL REUS W/TWL XL LVL3 (GOWN DISPOSABLE) ×1
HEMOSTAT SPONGE AVITENE ULTRA (HEMOSTASIS) IMPLANT
KIT BASIN OR (CUSTOM PROCEDURE TRAY) ×1 IMPLANT
KIT TURNOVER KIT B (KITS) ×1 IMPLANT
NS IRRIG 1000ML POUR BTL (IV SOLUTION) ×1 IMPLANT
PACK CV ACCESS (CUSTOM PROCEDURE TRAY) ×1 IMPLANT
PAD ARMBOARD 7.5X6 YLW CONV (MISCELLANEOUS) ×2 IMPLANT
SLING ARM FOAM STRAP LRG (SOFTGOODS) IMPLANT
SLING ARM FOAM STRAP MED (SOFTGOODS) IMPLANT
SPIKE FLUID TRANSFER (MISCELLANEOUS) ×1 IMPLANT
SUT MNCRL AB 4-0 PS2 18 (SUTURE) ×1 IMPLANT
SUT PROLENE 6 0 BV (SUTURE) ×1 IMPLANT
SUT PROLENE 7 0 BV 1 (SUTURE) IMPLANT
SUT VIC AB 3-0 SH 27 (SUTURE) ×1
SUT VIC AB 3-0 SH 27X BRD (SUTURE) ×1 IMPLANT
TOWEL GREEN STERILE (TOWEL DISPOSABLE) ×1 IMPLANT
UNDERPAD 30X36 HEAVY ABSORB (UNDERPADS AND DIAPERS) ×1 IMPLANT
WATER STERILE IRR 1000ML POUR (IV SOLUTION) ×1 IMPLANT

## 2022-12-02 NOTE — Telephone Encounter (Signed)
-----   Message from [facility] sent at 12/02/2022  2:35 PM EDT ----- S/p left brachiocephalic AVF creation 8/26  Please arrange follow up in 4-6 wks with dialysis duplex, with PA, on [place] office day

## 2022-12-02 NOTE — Anesthesia Procedure Notes (Signed)
Anesthesia Regional Block: Supraclavicular block   Pre-Anesthetic Checklist: , timeout performed,  Correct Patient, Correct Site, Correct Laterality,  Correct Procedure, Correct Position, site marked,  Risks and benefits discussed,  Surgical consent,  Pre-op evaluation,  At surgeon's request and post-op pain management  Laterality: Left and Upper  Prep: chloraprep       Needles:  Injection technique: Single-shot      Needle Length: 5cm  Needle Gauge: 22     Additional Needles: Arrow StimuQuik ECHO Echogenic Stimulating PNB Needle  Procedures:,,,, ultrasound used (permanent image in chart),,    Narrative:  Start time: 12/02/2022 12:40 PM End time: 12/02/2022 12:50 PM Injection made incrementally with aspirations every 5 mL.  Performed by: Personally  Anesthesiologist: Val Eagle, MD

## 2022-12-02 NOTE — Transfer of Care (Signed)
Immediate Anesthesia Transfer of Care Note  Patient: [name]  Procedure(s) Performed: LEFT ARM BRACHIOCEPHALIC ARTERIOVENOUS (AV) FISTULA CREATION (Left)  Patient Location: PACU  Anesthesia Type:MAC and Regional  Level of Consciousness: awake, drowsy, patient cooperative, and responds to stimulation  Airway & Oxygen Therapy: Patient Spontanous Breathing and Patient connected to nasal cannula oxygen  Post-op Assessment: Report given to RN and Post -op Vital signs reviewed and stable  Post vital signs: Reviewed and stable  Last Vitals:  Vitals Value Taken Time  BP 135/65 12/02/22 1438  Temp    Pulse 72 12/02/22 1440  Resp 12 12/02/22 1440  SpO2 97 % 12/02/22 1440  Vitals shown include unfiled device data.  Last Pain:  Vitals:   12/02/22 1019  PainSc: 0-No pain         Complications: No notable events documented.

## 2022-12-02 NOTE — Op Note (Signed)
OPERATIVE NOTE  DATE: December 02, 2022  PROCEDURE: left brachiocephalic arteriovenous fistula placement  PRE-OPERATIVE DIAGNOSIS: chronic kidney disease stage 5  POST-OPERATIVE DIAGNOSIS: same as above   SURGEON: [name], MD  ASSISTANT(S): Loel Dubonnet, PA  ANESTHESIA: regional  ESTIMATED BLOOD LOSS: Minimal  FINDING(S): 1.  Cephalic vein: 4-5 mm, acceptable 2.  Brachial artery: 4 mm, atherosclerotic disease evident 3.  Venous outflow: palpable thrill  4.  Radial flow: palpable radial pulse  SPECIMEN(S):  none  INDICATIONS:   [name] is a 89 y.o. male who presents with CKD stage 5 and the need for permanent hemodialysis access..  The patient is scheduled for left arm fistula placement.  The patient is aware the risks include but are not limited to: bleeding, infection, steal syndrome, nerve damage, ischemic monomelic neuropathy, failure to mature, and need for additional procedures.  The patient is aware of the risks of the procedure and elects to proceed forward.  An assistant was needed for sewing the anastomosis.   DESCRIPTION: After full informed written consent was obtained from the patient, the patient was brought back to the operating room and placed supine upon the operating table.  Prior to induction, the patient received IV antibiotics.   After obtaining adequate anesthesia, the patient was then prepped and draped in the standard fashion for a left arm access procedure.  I turned my attention first to identifying the patient's cephalic vein and brachial artery.  The cephalic vein was small at the wrist and better at the antecubital fossa.  Using SonoSite guidance, the location of these vessels were marked out on the skin at the elbow.  I  made a transverse incision at the level of the antecubitum and dissected through the subcutaneous tissue and fascia to gain exposure of the brachial artery.  This was noted to be 4 mm in diameter externally.   This was dissected out proximally and distally and controlled with vessel loops .  I then dissected out the cephalic vein.  This was noted to be 5 mm in diameter externally.  The distal segment of the vein was ligated with a  2-0 silk, and the vein was transected.  The proximal segment was interrogated with serial dilators.  The vein accepted up to a 5 mm dilator without any difficulty.  I then instilled the heparinized saline into the vein and clamped it.  At this point, I reset my exposure of the brachial artery.  The patient was given 3,000 units IV hepairn.  I then placed the artery under tension proximally and distally.  I made an arteriotomy with a #11 blade, and then I extended the arteriotomy with a Potts scissor.  I injected heparinized saline proximal and distal to this arteriotomy.  The vein was then sewn to the artery in an end-to-side configuration with a running stitch of 6-0 Prolene with the help of my assistant.  Prior to completing this anastomosis, I allowed the vein and artery to backbleed.  There was no evidence of clot from any vessels.  I completed the anastomosis in the usual fashion and then released all vessel loops and clamps.    There was a palpable thrill in the venous outflow, and there was a palpable radial pulse.  At this point, I irrigated out the surgical wound.  There was no further active bleeding.  The subcutaneous tissue was reapproximated with a running stitch of 3-0 Vicryl.  The skin was then reapproximated with a running subcuticular  stitch of 4-0 Monocryl.  The skin was then cleaned, dried, and reinforced with Dermabond.  The patient tolerated this procedure well.   COMPLICATIONS: None  CONDITION: Stable  [name], MD [facility] Office: [phone]  [name]   12/02/2022, 2:29 PM

## 2022-12-02 NOTE — Telephone Encounter (Signed)
Pt still in hospital

## 2022-12-02 NOTE — H&P (Signed)
History and Physical Interval Note:  12/02/2022 1:12 PM  [name]  has presented today for surgery, with the diagnosis of Chronic kidney disease, stage V.  The various methods of treatment have been discussed with the patient and family. After consideration of risks, benefits and other options for treatment, the patient has consented to  Procedure(s): LEFT ARM ARTERIOVENOUS ARTERIOVENOUS (AV) FISTULA CREATION (Left) as a surgical intervention.  The patient's history has been reviewed, patient examined, no change in status, stable for surgery.  I have reviewed the patient's chart and labs.  Questions were answered to the patient's satisfaction.    Left arm AVF.  [name]     Patient name: [name]        MRN: [id number]        DOB: [birth year]        Sex: male   REASON FOR CONSULT: Discuss permanent dialysis access    HPI: [name] is a 89 y.o. male, with history of coronary artery disease, CHF, chronic kidney disease stage 5, hypertension, hyperlipidemia, diabetes that presents for evaluation of permanent dialysis access.  Patient is not currently on dialysis at this time.  Is followed by Dr. Malen Gauze with [facility].  He recently underwent attempted PD catheter placement at [facility] and this was unsuccessful due to scar tissue.  Patient is right-handed.  No chest wall implants.  Not on dialysis at this time.  No prior access in his upper extremities.       Past Medical History:  Diagnosis Date   Allergy     CAD (coronary artery disease)      pci to LAD 10/09; stable CAD by cath 05/31/08; myoview 04/11/11- no ishcemia, EF 67%; echo 05/15/09- EF>55%, mod calcification of the aortic valve leaflets   CAD S/P percutaneous coronary angioplasty 05/08/2008    LAD PCI with DES 2009- Myoview low risk 2013   CHF (congestive heart failure) (HCC) 09/07/2019   Chronic back pain     Chronic kidney disease     Cystitis, radiation 12/01/2013   ED (erectile dysfunction)      Elevated serum creatinine 12/14/2015   Essential hypertension, benign     Hematuria 12/01/2013   Hyperlipidemia     Ileus (HCC) 12/14/2015   Insulin dependent type 2 diabetes mellitus, controlled (HCC)     Malignant neoplasm of prostate (HCC) 11/26/2013   Multiple rib fractures      left 9th, 10th, and 11th posterior rib fractures S/P fall 12/09/2015/notes 12/12/2015   Osteoarthritis of right knee 03/04/2018   Personal history of COVID-19 09/05/2021   PREMATURE VENTRICULAR CONTRACTIONS 05/08/2008    Qualifier: Diagnosis of  By: Johney Frame, MD, James     Prostate cancer [facility])      s/p   Rectal bleeding 12/23/2012    Evaluated by Dr Elnoria Howard GI patient with history of radiation proctitis. Patient due for colonoscopy on 04/2014    Type II or unspecified type diabetes mellitus without mention of complication, not stated as uncontrolled     Ventricular tachycardia (HCC)      resuscitated; monitor 05/2008; attempted T ablation 2/10- aborted due to inappropriate substrate               Past Surgical History:  Procedure Laterality Date   CARDIAC CATHETERIZATION   05/31/08    EF 55-60%, stable lesions, medical therapy   COLECTOMY   '80's    polyps   CORONARY ANGIOPLASTY WITH STENT PLACEMENT   01/19/08  PCI stent to mid LAD with Promus DES 27.5x28   CYSTOSCOPY WITH DIRECT VISION INTERNAL URETHROTOMY [name] 03/12/2021    Procedure: cystoscoopy withdirect vision internal urethrotomy optilum urethral dilation     ;  Surgeon: Crista Elliot, MD;  Location: [facility];  Service: Urology;  Laterality: [name];  RERQUESTING 45 MINS   IR GENERIC HISTORICAL   01/03/2016    IR THORACENTESIS ASP PLEURAL SPACE W/IMG GUIDE 01/03/2016 [facility]INTER[facility]   KNEE ARTHROPLASTY Right 03/04/2018    Procedure: COMPUTER ASSISTED TOTAL KNEE ARTHROPLASTY;  Surgeon: Samson Frederic, MD;  Location: [facility];  Service: Orthopedics;  Laterality: Right;   PTCA   01/2008   TRANSURETHRAL RESECTION OF BLADDER TUMOR [name] 11/10/2020    Procedure:  Caprice Kluver;  Surgeon: Malen Gauze, MD;  Location: [facility];  Service: Urology;  Laterality: [name];   TRANSURETHRAL RESECTION OF BLADDER TUMOR [name] 08/12/2022    Procedure: TRANSURETHRAL RESECTION OF BLADDER TUMOR (TURBT);  Surgeon: Crista Elliot, MD;  Location: [facility];  Service: Urology;  Laterality: [name];   TRANSURETHRAL RESECTION OF BLADDER TUMOR WITH MITOMYCIN-C [name] 09/17/2021    Procedure: TRANSURETHRAL RESECTION OF BLADDER TUMOR WITH GEMCITABINE;  Surgeon: Crista Elliot, MD;  Location: [facility];  Service: Urology;  Laterality: [name];               Family History  Problem Relation Age of Onset   Diabetes Mother     Heart attack Mother     Leukemia Father     Heart disease Sister     Heart disease Brother     Diabetes Brother          pancreatic cancer          SOCIAL HISTORY: Social History         Socioeconomic History   Marital status: Married      Spouse name: Not on file   Number of children: 7   Years of education: Not on file   Highest education level: Not on file  Occupational History   Occupation: RETIRED      Employer: RETIRED  Tobacco Use   Smoking status: Former      Current packs/day: 0.00      Types: Cigarettes      Quit date: 05/10/2002      Years since quitting: 20.5   Smokeless tobacco: Never   Tobacco comments:      Patient smokes occasionally. Not everyday.  Vaping Use   Vaping status: Never Used  Substance and Sexual Activity   Alcohol use: No   Drug use: No   Sexual activity: Never  Other Topics Concern   Not on file  Social History Narrative   Not on file    Social Determinants of Health        Financial Resource Strain: Not on file  Food Insecurity: Not on file  Transportation Needs: Not on file  Physical Activity: Not on file  Stress: Not on file  Social Connections: Unknown (10/02/2022)    Received from [facility], [facility]    Social Network     Social Network: Not on file  Intimate  Partner Violence: Unknown (10/02/2022)    Received from [facility], [facility]    HITS     Physically Hurt: Not on file     Insult or Talk Down To: Not on file     Threaten Physical Harm: Not on file     Scream or  Curse: Not on file      Allergies  No Known Allergies           Current Outpatient Medications  Medication Sig Dispense Refill   acetaminophen (TYLENOL) 500 MG tablet Take 1,000 mg by mouth in the morning.       amLODipine (NORVASC) 10 MG tablet TAKE 1 TABLET EVERY DAY (APPOINTMENT IS NEEDED FOR REFILLS) 90 tablet 3   atorvastatin (LIPITOR) 10 MG tablet TAKE 1 TABLET EVERY DAY 90 tablet 3   Blood Glucose Monitoring Suppl (ACCU-CHEK AVIVA PLUS) w/Device KIT Test blood sugar 3 times daily. Dx code: E11.22 1 kit 0   Blood Pressure Monitoring (BLOOD PRESS MONITOR/M-L CUFF) MISC 1 application by Does not apply route daily. 1 each 0   calcitRIOL (ROCALTROL) 0.25 MCG capsule Take 0.25 mcg by mouth 3 (three) times a week.       carvedilol (COREG) 25 MG tablet TAKE 1 TABLET TWICE DAILY. KEEP OFFICE VISIT 180 tablet 3   Cholecalciferol (VITAMIN D-3 PO) Take 1 tablet by mouth daily.       Continuous Blood Gluc Sensor (FREESTYLE LIBRE SENSOR SYSTEM) MISC 1 application by Does not apply route daily. 1 each 2   Continuous Glucose Monitor Sup KIT 1 kit by Does not apply route as directed. 1 kit 0   furosemide (LASIX) 40 MG tablet TAKE 1 TABLET TWICE DAILY (Patient taking differently: Take 40 mg by mouth daily.) 180 tablet 10   glucose blood (ACCU-CHEK AVIVA PLUS) test strip Test blood sugar 3 times daily. Dx code: E11.22 300 each 3   hydrALAZINE (APRESOLINE) 50 MG tablet Take 50 mg by mouth in the morning and at bedtime. (Patient not taking: Reported on 11/06/2022)       HYDROcodone-acetaminophen (NORCO) 5-325 MG tablet Take 1 tablet by mouth every 4 (four) hours as needed for moderate pain. (Patient not taking: Reported on 11/06/2022) 6 tablet 0   insulin NPH-regular Human (NOVOLIN  70/30) (70-30) 100 UNIT/ML injection Inject 11 Units into the skin 2 (two) times daily with a meal. (Patient taking differently: Inject 8 Units into the skin in the morning and at bedtime.) 15 mL 5   Insulin Syringe-Needle U-100 (B-D INS SYR HALF-UNIT .3CC/31G) 31G X 5/16" 0.3 ML MISC Use daily at bedtime to inject insulin. Dx code: 250.00 100 each 3   isosorbide mononitrate (IMDUR) 60 MG 24 hr tablet Take 2 tablets (120 mg total) by mouth daily. 180 tablet 2   Lancets (ACCU-CHEK MULTICLIX) lancets Test blood sugar 3 times daily. Dx code: E11.22 300 each 3   Multiple Vitamin (MULTIVITAMIN) tablet Take 1 tablet by mouth daily.       Omega-3 Fatty Acids (FISH OIL PO) Take 1 capsule by mouth daily.       tamsulosin (FLOMAX) 0.4 MG CAPS capsule Take 0.4 mg by mouth daily.          No current facility-administered medications for this visit.        REVIEW OF SYSTEMS:  [X]  denotes positive finding, [ ]  denotes negative finding Cardiac   Comments:  Chest pain or chest pressure:      Shortness of breath upon exertion:      Short of breath when lying flat:      Irregular heart rhythm:             Vascular      Pain in calf, thigh, or hip brought on by ambulation:      Pain in feet  at night that wakes you up from your sleep:       Blood clot in your veins:      Leg swelling:              Pulmonary      Oxygen at home:      Productive cough:       Wheezing:              Neurologic      Sudden weakness in arms or legs:       Sudden numbness in arms or legs:       Sudden onset of difficulty speaking or slurred speech:      Temporary loss of vision in one eye:       Problems with dizziness:              Gastrointestinal      Blood in stool:       Vomited blood:              Genitourinary      Burning when urinating:       Blood in urine:             Psychiatric      Major depression:              Hematologic      Bleeding problems:      Problems with blood clotting too easily:              Skin      Rashes or ulcers:             Constitutional      Fever or chills:          PHYSICAL EXAM: There were no vitals filed for this visit.   GENERAL: The patient is a well-nourished male, in no acute distress. The vital signs are documented above. CARDIAC: There is a regular rate and rhythm.  VASCULAR:  Bilateral radial and brachial pulses palpable upper extremities No chest wall implants PULMONARY:No respiratory distress. ABDOMEN: Soft and non-tender. MUSCULOSKELETAL: There are no major deformities or cyanosis. NEUROLOGIC: No focal weakness or paresthesias are detected. SKIN: There are no ulcers or rashes noted. PSYCHIATRIC: The patient has a normal affect.   DATA:    Upper extremity vein mapping shows usable cephalic vein in the left arm   Assessment/Plan:   89 y.o. male, with history of coronary artery disease, CHF, chronic kidney disease stage 5, hypertension, hyperlipidemia, diabetes that presents for evaluation of permanent hemodialysis access.  Discussed dialysis access placement in the nondominant arm which would be his left arm.  I discussed he has a good cephalic vein based on vein mapping.  I discussed indications for access placement are for stage IV and V kidney disease which he would qualify.  I discussed this being done at [facility] as an outpatient.  I discussed fistula taking 3 months to mature.  Discussed we would evaluate with ultrasound in the operating room to decide if this would be at the wrist with a radiocephalic or higher at the elbow with a brachiocephalic.  Risk benefits discussed including risk of bleeding, infection, nonhealing, steal syndrome, failure to mature.  As noted in the HPI he recently underwent attempted PD catheter placement at [facility] and this was unsuccessful due to reported scar tissue.  Will get him scheduled for left arm AV fistula at his convenience.     [name], MD [facility]  [place] Office: [phone]

## 2022-12-02 NOTE — Discharge Instructions (Signed)
[facility]  Discharge Instructions  AV Fistula or Graft Surgery for Dialysis Access  Please refer to the following instructions for your post-procedure care. Your surgeon or physician assistant will discuss any changes with you.  Activity  You may drive the day following your surgery, if you are comfortable and no longer taking prescription pain medication. Resume full activity as the soreness in your incision resolves.  Bathing/Showering  You may shower after you go home. Keep your incision dry for 48 hours. Do not soak in a bathtub, hot tub, or swim until the incision heals completely. You may not shower if you have a hemodialysis catheter.  Incision Care  Clean your incision with mild soap and water after 48 hours. Pat the area dry with a clean towel. You do not need a bandage unless otherwise instructed. Do not apply any ointments or creams to your incision. You may have skin glue on your incision. Do not peel it off. It will come off on its own in about one week. Your arm may swell a bit after surgery. To reduce swelling use pillows to elevate your arm so it is above your heart. Your doctor will tell you if you need to lightly wrap your arm with an ACE bandage.  Diet  Resume your normal diet. There are not special food restrictions following this procedure. In order to heal from your surgery, it is CRITICAL to get adequate nutrition. Your body requires vitamins, minerals, and protein. Vegetables are the best source of vitamins and minerals. Vegetables also provide the perfect balance of protein. Processed food has little nutritional value, so try to avoid this.  Medications  Resume taking all of your medications. If your incision is causing pain, you may take over-the counter pain relievers such as acetaminophen (Tylenol). If you were prescribed a stronger pain medication, please be aware these medications can cause nausea and constipation. Prevent  nausea by taking the medication with a snack or meal. Avoid constipation by drinking plenty of fluids and eating foods with high amount of fiber, such as fruits, vegetables, and grains.  Do not take Tylenol if you are taking prescription pain medications.  Follow up Your surgeon may want to see you in the office following your access surgery. If so, this will be arranged at the time of your surgery.  Please call us immediately for any of the following conditions:  Increased pain, redness, drainage (pus) from your incision site Fever of 101 degrees or higher Severe or worsening pain at your incision site Hand pain or numbness.  Reduce your risk of vascular disease:  Stop smoking. If you would like help, call [facility] at 1-800-QUIT-NOW (845-819-7743) or [place] at 812 590 8845  Manage your cholesterol Maintain a desired weight Control your diabetes Keep your blood pressure down  Dialysis  It will take several weeks to several months for your new dialysis access to be ready for use. Your surgeon will determine when it is okay to use it. Your nephrologist will continue to direct your dialysis. You can continue to use your Permcath until your new access is ready for use.   12/02/2022 [name] [id number] October 15, 1933  Surgeon(s): [name], MD  Procedure(s): LEFT ARM BRACHIOCEPHALIC ARTERIOVENOUS (AV) FISTULA CREATION   May stick graft immediately   May stick graft on designated area only:   x Do not stick fistula for 12 weeks    If you have any questions, please call the office at  [phone].  

## 2022-12-03 ENCOUNTER — Encounter (HOSPITAL_COMMUNITY): Payer: Self-pay | Admitting: Vascular Surgery

## 2022-12-03 NOTE — Telephone Encounter (Signed)
 Pt Appt scheduled

## 2022-12-03 NOTE — Anesthesia Postprocedure Evaluation (Signed)
Anesthesia Post Note  Patient: [name]  Procedure(s) Performed: LEFT ARM BRACHIOCEPHALIC ARTERIOVENOUS (AV) FISTULA CREATION (Left)     Patient location during evaluation: PACU Anesthesia Type: Regional and MAC Level of consciousness: awake and alert Pain management: pain level controlled Vital Signs Assessment: post-procedure vital signs reviewed and stable Respiratory status: spontaneous breathing, nonlabored ventilation, respiratory function stable and patient connected to nasal cannula oxygen Cardiovascular status: stable and blood pressure returned to baseline Postop Assessment: no apparent nausea or vomiting Anesthetic complications: no   No notable events documented.  Last Vitals:  Vitals:   12/02/22 1500 12/02/22 1515  BP: (!) 140/62 (!) 153/62  Pulse: 70 73  Resp: 11 10  Temp:  36.5 C  SpO2: 100% 100%    Last Pain:  Vitals:   12/02/22 1515  PainSc: Asleep                 Garcia Dalzell

## 2022-12-05 ENCOUNTER — Telehealth: Payer: Self-pay

## 2022-12-05 NOTE — Telephone Encounter (Signed)
Pt's family member called stating that the pt was itching since his surgery and requested advice on what he could take for it.  Reviewed pt's chart, returned call for clarification, no answer, lf vm instructing him to take Benadryl and use an ice pack if it was a focal point of itching.

## 2023-01-01 ENCOUNTER — Other Ambulatory Visit: Payer: Self-pay | Admitting: *Deleted

## 2023-01-01 DIAGNOSIS — N185 Chronic kidney disease, stage 5: Secondary | ICD-10-CM

## 2023-01-07 ENCOUNTER — Encounter: Payer: Self-pay | Admitting: Physician Assistant

## 2023-01-07 ENCOUNTER — Ambulatory Visit (HOSPITAL_COMMUNITY)
Admission: RE | Admit: 2023-01-07 | Discharge: 2023-01-07 | Disposition: A | Discharge status: Home / Self Care | Payer: Medicare HMO | Source: Ambulatory Visit | Attending: Vascular Surgery | Admitting: Vascular Surgery

## 2023-01-07 ENCOUNTER — Ambulatory Visit (INDEPENDENT_AMBULATORY_CARE_PROVIDER_SITE_OTHER): Payer: Medicare HMO | Admitting: Physician Assistant

## 2023-01-07 VITALS — BP 176/69 | HR 66 | Temp 98.0°F | Wt 221.0 lb

## 2023-01-07 DIAGNOSIS — N185 Chronic kidney disease, stage 5: Secondary | ICD-10-CM

## 2023-01-07 NOTE — Progress Notes (Signed)
POST OPERATIVE OFFICE NOTE    CC:  F/u for surgery  HPI:  [name] is a 89 y.o. male who is s/p left brachiocephalic AV fistula creation on 12/02/2022 by Dr. Chestine Spore.  This was done for permanent dialysis access.  The patient has a history of CKD stage V.   He returns today for a post op check.  He denies any issues with his incision such as drainage or erythema.  He denies any symptoms of steal in the left hand such as pain, excessive coldness, numbness, or weakness.  He is not on dialysis yet.    No Known Allergies  Current Outpatient Medications  Medication Sig Dispense Refill   acetaminophen (TYLENOL) 500 MG tablet Take 1,000 mg by mouth every 8 (eight) hours as needed for moderate pain.     amLODipine (NORVASC) 10 MG tablet TAKE 1 TABLET EVERY DAY (APPOINTMENT IS NEEDED FOR REFILLS) 90 tablet 3   amoxicillin-clavulanate (AUGMENTIN) 875-125 MG tablet Take 1 tablet by mouth 2 (two) times daily. 7 day supply     atorvastatin (LIPITOR) 10 MG tablet TAKE 1 TABLET EVERY DAY (Patient taking differently: Take 10 mg by mouth at bedtime.) 90 tablet 3   Blood Glucose Monitoring Suppl (ACCU-CHEK AVIVA PLUS) w/Device KIT Test blood sugar 3 times daily. Dx code: E11.22 1 kit 0   Blood Pressure Monitoring (BLOOD PRESS MONITOR/M-L CUFF) MISC 1 application by Does not apply route daily. 1 each 0   calcitRIOL (ROCALTROL) 0.25 MCG capsule Take 0.25 mcg by mouth daily.     carvedilol (COREG) 25 MG tablet TAKE 1 TABLET TWICE DAILY. KEEP OFFICE VISIT 180 tablet 3   Cholecalciferol (VITAMIN D-3 PO) Take 1 tablet by mouth daily.     Continuous Blood Gluc Sensor (FREESTYLE LIBRE SENSOR SYSTEM) MISC 1 application by Does not apply route daily. 1 each 2   Continuous Glucose Monitor Sup KIT 1 kit by Does not apply route as directed. 1 kit 0   furosemide (LASIX) 40 MG tablet TAKE 1 TABLET TWICE DAILY 180 tablet 10   glucose blood (ACCU-CHEK AVIVA PLUS) test strip Test blood sugar 3 times daily. Dx code:  E11.22 300 each 3   insulin NPH-regular Human (NOVOLIN 70/30) (70-30) 100 UNIT/ML injection Inject 11 Units into the skin 2 (two) times daily with a meal. (Patient taking differently: Inject 8 Units into the skin 2 (two) times daily with a meal.) 15 mL 5   Insulin Syringe-Needle U-100 (B-D INS SYR HALF-UNIT .3CC/31G) 31G X 5/16" 0.3 ML MISC Use daily at bedtime to inject insulin. Dx code: 250.00 100 each 3   isosorbide mononitrate (IMDUR) 60 MG 24 hr tablet Take 2 tablets (120 mg total) by mouth daily. (Patient taking differently: Take 60 mg by mouth in the morning and at bedtime.) 180 tablet 2   Lancets (ACCU-CHEK MULTICLIX) lancets Test blood sugar 3 times daily. Dx code: E11.22 300 each 3   Multiple Vitamin (MULTIVITAMIN) tablet Take 1 tablet by mouth daily.     Omega-3 Fatty Acids (FISH OIL PO) Take 1 capsule by mouth daily.     oxyCODONE-acetaminophen (PERCOCET) 5-325 MG tablet Take 1 tablet by mouth every 4 (four) hours as needed for severe pain. 10 tablet 0   Polyethyl Glycol-Propyl Glycol (SYSTANE) 0.4-0.3 % SOLN Place 1 drop into both eyes as needed (dry eyes).     sodium bicarbonate 650 MG tablet Take 650 mg by mouth daily.     tamsulosin (FLOMAX) 0.4 MG CAPS capsule Take  0.4 mg by mouth daily.     No current facility-administered medications for this visit.     ROS:  See HPI  Physical Exam:   Dialysis Duplex: 01/07/2023 +--------------------+----------+-----------------+--------+  AVF                PSV (cm/s)Flow Vol (mL/min)Comments  +--------------------+----------+-----------------+--------+  Native artery inflow   140           535                 +--------------------+----------+-----------------+--------+  AVF Anastomosis        178                               +--------------------+----------+-----------------+--------+     +------------+----------+-------------+----------+-------------------------  ----+  OUTFLOW VEINPSV (cm/s)Diameter (cm)Depth  (cm)          Describe              +------------+----------+-------------+----------+-------------------------  ----+  Shoulder       28        0.40        0.94                                   +------------+----------+-------------+----------+-------------------------  ----+  Prox UA         32        0.42        0.56                                   +------------+----------+-------------+----------+-------------------------  ----+  Mid UA          21        0.46        0.69    competing branch  measuring                                                        0.18cm. patent           +------------+----------+-------------+----------+-------------------------  ----+  Dist UA         0         0.70        0.21    competing branch  measuring                                                      0.39cm. thrombosed         +------------+----------+-------------+----------+-------------------------  ----+  AC Fossa       310        0.92        0.16                                    Incision: Well-healed left AC fossa incision without dehiscence, erythema, or hematoma Extremities: Pulsatile left brachiocephalic fistula in the AC fossa.  No thrill or bruit in the fistula past the [facility]  fossa.  2+ left radial pulse Neuro: intact motor and sensation of LUE    Assessment/Plan:  This is a 89 y.o. male who is here for a post op check  -The patient underwent left brachiocephalic fistula creation on 12/02/2022 by Dr. Chestine Spore.  This was done for permanent dialysis access in the setting of CKD 5.  The patient is currently not on dialysis -The patient's left AC fossa incision is well-healed without signs of infection or hematoma.  He denies any symptoms of steal in the left hand such as weakness, numbness, excessive pain, or coldness.  He has a 2+ left radial pulse -Unfortunately duplex demonstrates an occluded left brachiocephalic fistula.  Physical exam  is supportive of this duplex finding.  The fistula is pulsatile in the [facility] fossa and has no thrill or bruit in it proximally -I discussed this case with Dr. Chestine Spore.  The best course of action for the patient would be to plan for left basilic vein fistula creation.  I explained to the patient the risks and benefits of this procedure.  I explained he will need a second procedure for superficialization and transposition.  We will schedule this in the next few weeks with Dr. Chestine Spore.  All the patient's and family's questions have been answered.   Loel Dubonnet, PA-C [facility] [phone]   Clinic MD:  Steve Rattler

## 2023-01-13 ENCOUNTER — Other Ambulatory Visit: Payer: Self-pay

## 2023-01-13 DIAGNOSIS — N185 Chronic kidney disease, stage 5: Secondary | ICD-10-CM

## 2023-01-15 ENCOUNTER — Other Ambulatory Visit: Payer: Self-pay | Admitting: Cardiovascular Disease

## 2023-01-21 ENCOUNTER — Encounter (HOSPITAL_COMMUNITY): Payer: Self-pay | Admitting: Vascular Surgery

## 2023-01-21 ENCOUNTER — Other Ambulatory Visit: Payer: Self-pay

## 2023-01-21 NOTE — Progress Notes (Addendum)
PCP - Dr Bernadette Hoit Cardiologist - Dr Nanetta Batty  Chest x-ray - n/a EKG - 08/05/22 Stress Test - 04/11/11 ECHO - 08/07/22 Cardiac Cath - 05/31/08  ICD Pacemaker/Loop - n/a  Sleep Study -  n/a  Diabetes Type 2     THE MORNING OF SURGERY, take 4 units of NPH Insulin.    If your blood sugar is less than 70 mg/dL, you will need to treat for low blood sugar: Treat a low blood sugar (less than 70 mg/dL) with  cup of clear juice (cranberry or apple), 4 glucose tablets, OR glucose gel. Recheck blood sugar in 15 minutes after treatment (to make sure it is greater than 70 mg/dL). If your blood sugar is not greater than 70 mg/dL on recheck, call [phone] for further instructions.  NPO  Anesthesia review: Yes  STOP now taking any Aspirin (unless otherwise instructed by your surgeon), Aleve, Naproxen, Ibuprofen, Motrin, Advil, Goody's, BC's, all herbal medications, fish oil, and all vitamins.   Coronavirus Screening Do you have any of the following symptoms:  Cough yes/no: No Fever (>100.55F)  yes/no: No Runny nose yes/no: No Sore throat yes/no: No Difficulty breathing/shortness of breath  yes/no: No  Have you traveled in the last 14 days and where? yes/no: No  Patient's wife [name] verbalized understanding of instructions that were given via phone.

## 2023-01-22 ENCOUNTER — Ambulatory Visit (HOSPITAL_COMMUNITY)
Admission: RE | Admit: 2023-01-22 | Discharge: 2023-01-22 | Disposition: A | Discharge status: Home / Self Care | Payer: Medicare HMO | Attending: Vascular Surgery | Admitting: Vascular Surgery

## 2023-01-22 ENCOUNTER — Other Ambulatory Visit: Payer: Self-pay

## 2023-01-22 ENCOUNTER — Encounter (HOSPITAL_COMMUNITY)
Admission: RE | Disposition: A | Discharge status: Home / Self Care | Payer: Self-pay | Source: Home / Self Care | Attending: Vascular Surgery

## 2023-01-22 ENCOUNTER — Encounter (HOSPITAL_COMMUNITY): Payer: Self-pay | Admitting: Vascular Surgery

## 2023-01-22 ENCOUNTER — Ambulatory Visit (HOSPITAL_COMMUNITY): Payer: Self-pay | Admitting: Vascular Surgery

## 2023-01-22 ENCOUNTER — Ambulatory Visit (HOSPITAL_COMMUNITY): Payer: Medicare HMO | Admitting: Vascular Surgery

## 2023-01-22 DIAGNOSIS — E1122 Type 2 diabetes mellitus with diabetic chronic kidney disease: Secondary | ICD-10-CM | POA: Diagnosis not present

## 2023-01-22 DIAGNOSIS — I132 Hypertensive heart and chronic kidney disease with heart failure and with stage 5 chronic kidney disease, or end stage renal disease: Secondary | ICD-10-CM | POA: Diagnosis not present

## 2023-01-22 DIAGNOSIS — N185 Chronic kidney disease, stage 5: Secondary | ICD-10-CM

## 2023-01-22 DIAGNOSIS — I2510 Atherosclerotic heart disease of native coronary artery without angina pectoris: Secondary | ICD-10-CM | POA: Diagnosis not present

## 2023-01-22 DIAGNOSIS — Z955 Presence of coronary angioplasty implant and graft: Secondary | ICD-10-CM | POA: Diagnosis not present

## 2023-01-22 DIAGNOSIS — Z87891 Personal history of nicotine dependence: Secondary | ICD-10-CM | POA: Diagnosis not present

## 2023-01-22 DIAGNOSIS — I509 Heart failure, unspecified: Secondary | ICD-10-CM | POA: Diagnosis not present

## 2023-01-22 DIAGNOSIS — Z794 Long term (current) use of insulin: Secondary | ICD-10-CM | POA: Insufficient documentation

## 2023-01-22 HISTORY — PX: AV FISTULA PLACEMENT: SHX1204

## 2023-01-22 LAB — POCT I-STAT, CHEM 8
BUN: 61 mg/dL — ABNORMAL HIGH (ref 8–23)
Calcium, Ion: 1.02 mmol/L — ABNORMAL LOW (ref 1.15–1.40)
Chloride: 120 mmol/L — ABNORMAL HIGH (ref 98–111)
Creatinine, Ser: 6.7 mg/dL — ABNORMAL HIGH (ref 0.61–1.24)
Glucose, Bld: 133 mg/dL — ABNORMAL HIGH (ref 70–99)
HCT: 27 % — ABNORMAL LOW (ref 39.0–52.0)
Hemoglobin: 9.2 g/dL — ABNORMAL LOW (ref 13.0–17.0)
Potassium: 4.3 mmol/L (ref 3.5–5.1)
Sodium: 143 mmol/L (ref 135–145)
TCO2: 14 mmol/L — ABNORMAL LOW (ref 22–32)

## 2023-01-22 LAB — GLUCOSE, CAPILLARY
Glucose-Capillary: 140 mg/dL — ABNORMAL HIGH (ref 70–99)
Glucose-Capillary: 127 mg/dL — ABNORMAL HIGH (ref 70–99)
Glucose-Capillary: 172 mg/dL — ABNORMAL HIGH (ref 70–99)

## 2023-01-22 SURGERY — ARTERIOVENOUS (AV) FISTULA CREATION
Anesthesia: Regional | Site: Arm Upper | Laterality: Left

## 2023-01-22 MED ORDER — ORAL CARE MOUTH RINSE: 15.0000 mL | Freq: Once | OROMUCOSAL | Status: AC

## 2023-01-22 MED ORDER — FENTANYL CITRATE PF 50 MCG/ML IJ SOSY
25.0000 ug | PREFILLED_SYRINGE | Freq: Once | INTRAMUSCULAR | Status: AC
  Administered 2023-01-22: 25 ug via INTRAVENOUS
  Filled 2023-01-22: qty 0.5

## 2023-01-22 MED ORDER — SODIUM CHLORIDE 0.9 % IV SOLN: INTRAVENOUS | Status: DC

## 2023-01-22 MED ORDER — FENTANYL CITRATE (PF) 100 MCG/2ML IJ SOLN
INTRAMUSCULAR | Status: AC
  Filled 2023-01-22: qty 2

## 2023-01-22 MED ORDER — SODIUM CHLORIDE 0.9% FLUSH: 3.0000 mL | Freq: Two times a day (BID) | INTRAVENOUS | Status: DC

## 2023-01-22 MED ORDER — PROPOFOL 1000 MG/100ML IV EMUL
INTRAVENOUS | Status: AC
  Filled 2023-01-22: qty 100

## 2023-01-22 MED ORDER — LIDOCAINE-EPINEPHRINE (PF) 1.5 %-1:200000 IJ SOLN
INTRAMUSCULAR | Status: DC | PRN
Start: 2023-01-22 — End: 2023-01-22
  Administered 2023-01-22: 30 mL via PERINEURAL

## 2023-01-22 MED ORDER — PHENYLEPHRINE 80 MCG/ML (10ML) SYRINGE FOR IV PUSH (FOR BLOOD PRESSURE SUPPORT)
PREFILLED_SYRINGE | INTRAVENOUS | Status: DC | PRN
  Administered 2023-01-22: 160 ug via INTRAVENOUS
  Administered 2023-01-22: 80 ug via INTRAVENOUS
  Administered 2023-01-22: 160 ug via INTRAVENOUS

## 2023-01-22 MED ORDER — OXYCODONE-ACETAMINOPHEN 5-325 MG PO TABS
1.0000 | ORAL_TABLET | Freq: Four times a day (QID) | ORAL | 0 refills | Status: DC | PRN
Start: 2023-01-22 — End: 2023-02-10

## 2023-01-22 MED ORDER — PHENYLEPHRINE 80 MCG/ML (10ML) SYRINGE FOR IV PUSH (FOR BLOOD PRESSURE SUPPORT)
PREFILLED_SYRINGE | INTRAVENOUS | Status: AC
  Filled 2023-01-22: qty 10

## 2023-01-22 MED ORDER — ONDANSETRON HCL 4 MG/2ML IJ SOLN
INTRAMUSCULAR | Status: AC
  Filled 2023-01-22: qty 2

## 2023-01-22 MED ORDER — PHENYLEPHRINE HCL-NACL 20-0.9 MG/250ML-% IV SOLN
INTRAVENOUS | Status: DC | PRN
  Administered 2023-01-22: 40 ug/min via INTRAVENOUS

## 2023-01-22 MED ORDER — VASOPRESSIN 20 UNIT/ML IV SOLN
INTRAVENOUS | Status: DC | PRN
Start: 2023-01-22 — End: 2023-01-22
  Administered 2023-01-22: 3 [IU] via INTRAVENOUS
  Administered 2023-01-22: 2 [IU] via INTRAVENOUS
  Administered 2023-01-22: 1 [IU] via INTRAVENOUS
  Administered 2023-01-22: 2 [IU] via INTRAVENOUS

## 2023-01-22 MED ORDER — INSULIN ASPART 100 UNIT/ML IJ SOLN: 0.0000 [IU] | INTRAMUSCULAR | Status: DC | PRN

## 2023-01-22 MED ORDER — LIDOCAINE 2% (20 MG/ML) 5 ML SYRINGE
INTRAMUSCULAR | Status: AC
  Filled 2023-01-22: qty 5

## 2023-01-22 MED ORDER — ONDANSETRON HCL 4 MG/2ML IJ SOLN
INTRAMUSCULAR | Status: DC | PRN
  Administered 2023-01-22: 4 mg via INTRAVENOUS

## 2023-01-22 MED ORDER — VASOPRESSIN 20 UNIT/ML IV SOLN
INTRAVENOUS | Status: AC
  Filled 2023-01-22: qty 1

## 2023-01-22 MED ORDER — CHLORHEXIDINE GLUCONATE 4 % EX SOLN: 60.0000 mL | Freq: Once | CUTANEOUS | Status: DC

## 2023-01-22 MED ORDER — CHLORHEXIDINE GLUCONATE 0.12 % MT SOLN: 15.0000 mL | Freq: Once | OROMUCOSAL | Status: AC

## 2023-01-22 MED ORDER — HEPARIN SODIUM (PORCINE) 1000 UNIT/ML IJ SOLN
INTRAMUSCULAR | Status: DC | PRN
Start: 2023-01-22 — End: 2023-01-22
  Administered 2023-01-22: 5000 [IU] via INTRAVENOUS

## 2023-01-22 MED ORDER — CEFAZOLIN SODIUM-DEXTROSE 2-4 GM/100ML-% IV SOLN
INTRAVENOUS | Status: AC
  Filled 2023-01-22: qty 100

## 2023-01-22 MED ORDER — DEXAMETHASONE SODIUM PHOSPHATE 10 MG/ML IJ SOLN
INTRAMUSCULAR | Status: AC
  Filled 2023-01-22: qty 1

## 2023-01-22 MED ORDER — CHLORHEXIDINE GLUCONATE 0.12 % MT SOLN
OROMUCOSAL | Status: AC
  Administered 2023-01-22: 15 mL via OROMUCOSAL
  Filled 2023-01-22: qty 15

## 2023-01-22 MED ORDER — HEPARIN 6000 UNIT IRRIGATION SOLUTION
Status: DC | PRN
  Administered 2023-01-22: 1

## 2023-01-22 MED ORDER — 0.9 % SODIUM CHLORIDE (POUR BTL) OPTIME
TOPICAL | Status: DC | PRN
  Administered 2023-01-22: 1000 mL

## 2023-01-22 MED ORDER — FENTANYL CITRATE (PF) 250 MCG/5ML IJ SOLN
INTRAMUSCULAR | Status: AC
  Filled 2023-01-22: qty 5

## 2023-01-22 MED ORDER — CEFAZOLIN SODIUM-DEXTROSE 2-4 GM/100ML-% IV SOLN
2.0000 g | INTRAVENOUS | Status: AC
  Administered 2023-01-22: 2 g via INTRAVENOUS

## 2023-01-22 MED ORDER — PROPOFOL 500 MG/50ML IV EMUL
INTRAVENOUS | Status: DC | PRN
  Administered 2023-01-22: 50 ug/kg/min via INTRAVENOUS

## 2023-01-22 MED ORDER — MIDAZOLAM HCL 2 MG/2ML IJ SOLN
INTRAMUSCULAR | Status: AC
  Filled 2023-01-22: qty 2

## 2023-01-22 SURGICAL SUPPLY — 35 items
ADH SKN CLS APL DERMABOND .7 (GAUZE/BANDAGES/DRESSINGS) ×1
AGENT HMST SPONGE THK3/8 (HEMOSTASIS)
ARMBAND PINK RESTRICT EXTREMIT (MISCELLANEOUS) ×2 IMPLANT
BAG COUNTER SPONGE SURGICOUNT (BAG) ×1 IMPLANT
BAG SPNG CNTER NS LX DISP (BAG) ×1
BLADE CLIPPER SURG (BLADE) ×1 IMPLANT
CANISTER SUCT 3000ML PPV (MISCELLANEOUS) ×1 IMPLANT
CLIP TI MEDIUM 6 (CLIP) ×1 IMPLANT
CLIP TI WIDE RED SMALL 6 (CLIP) ×1 IMPLANT
COVER PROBE W GEL 5X96 (DRAPES) ×1 IMPLANT
DERMABOND ADVANCED .7 DNX12 (GAUZE/BANDAGES/DRESSINGS) ×1 IMPLANT
ELECT REM PT RETURN 9FT ADLT (ELECTROSURGICAL) ×1 IMPLANT
ELECTRODE REM PT RTRN 9FT ADLT (ELECTROSURGICAL) ×1 IMPLANT
GLOVE BIO SURGEON STRL SZ7.5 (GLOVE) ×1 IMPLANT
GLOVE BIOGEL PI IND STRL 8 (GLOVE) ×1 IMPLANT
GOWN STRL REUS W/ TWL LRG LVL3 (GOWN DISPOSABLE) ×2 IMPLANT
GOWN STRL REUS W/ TWL XL LVL3 (GOWN DISPOSABLE) ×2 IMPLANT
GOWN STRL REUS W/TWL LRG LVL3 (GOWN DISPOSABLE) ×2
GOWN STRL REUS W/TWL XL LVL3 (GOWN DISPOSABLE) ×2
HEMOSTAT SPONGE AVITENE ULTRA (HEMOSTASIS) IMPLANT
KIT BASIN OR (CUSTOM PROCEDURE TRAY) ×1 IMPLANT
KIT TURNOVER KIT B (KITS) ×1 IMPLANT
LOOP VASCULAR MINI 18 RED (MISCELLANEOUS) ×1
NS IRRIG 1000ML POUR BTL (IV SOLUTION) ×1 IMPLANT
PACK CV ACCESS (CUSTOM PROCEDURE TRAY) ×1 IMPLANT
PAD ARMBOARD 7.5X6 YLW CONV (MISCELLANEOUS) ×2 IMPLANT
SLING ARM FOAM STRAP LRG (SOFTGOODS) IMPLANT
SUT MNCRL AB 4-0 PS2 18 (SUTURE) ×1 IMPLANT
SUT PROLENE 6 0 BV (SUTURE) ×1 IMPLANT
SUT VIC AB 3-0 SH 27 (SUTURE) ×1
SUT VIC AB 3-0 SH 27X BRD (SUTURE) ×1 IMPLANT
TOWEL GREEN STERILE (TOWEL DISPOSABLE) ×1 IMPLANT
UNDERPAD 30X36 HEAVY ABSORB (UNDERPADS AND DIAPERS) ×1 IMPLANT
VASCULAR TIE MINI RED 18IN STL (MISCELLANEOUS) IMPLANT
WATER STERILE IRR 1000ML POUR (IV SOLUTION) ×1 IMPLANT

## 2023-01-22 NOTE — Transfer of Care (Signed)
Immediate Anesthesia Transfer of Care Note  Patient: [name]  Procedure(s) Performed: LEFT ARM BRACHIOBASILISC ARTERIOVENOUS (AV) FISTULA CREATION (Left: Arm Upper)  Patient Location: PACU  Anesthesia Type:General  Level of Consciousness: awake, alert , and patient cooperative  Airway & Oxygen Therapy: Patient Spontanous Breathing  Post-op Assessment: Report given to RN and Post -op Vital signs reviewed and stable  Post vital signs: Reviewed and stable  Last Vitals:  Vitals Value Taken Time  BP 125/100 01/22/23 1153  Temp    Pulse 77 01/22/23 1154  Resp 18 01/22/23 1154  SpO2 97 % 01/22/23 1154  Vitals shown include unfiled device data.  Last Pain:  Vitals:   01/22/23 0841  TempSrc:   PainSc: 0-No pain         Complications: No notable events documented.

## 2023-01-22 NOTE — Discharge Instructions (Signed)
[facility]  Discharge Instructions  AV Fistula or Graft Surgery for Dialysis Access  Please refer to the following instructions for your post-procedure care. Your surgeon or physician assistant will discuss any changes with you.  Activity  You may drive the day following your surgery, if you are comfortable and no longer taking prescription pain medication. Resume full activity as the soreness in your incision resolves.  Bathing/Showering  You may shower after you go home. Keep your incision dry for 48 hours. Do not soak in a bathtub, hot tub, or swim until the incision heals completely. You may not shower if you have a hemodialysis catheter.  Incision Care  Clean your incision with mild soap and water after 48 hours. Pat the area dry with a clean towel. You do not need a bandage unless otherwise instructed. Do not apply any ointments or creams to your incision. You may have skin glue on your incision. Do not peel it off. It will come off on its own in about one week. Your arm may swell a bit after surgery. To reduce swelling use pillows to elevate your arm so it is above your heart. Your doctor will tell you if you need to lightly wrap your arm with an ACE bandage.  Diet  Resume your normal diet. There are not special food restrictions following this procedure. In order to heal from your surgery, it is CRITICAL to get adequate nutrition. Your body requires vitamins, minerals, and protein. Vegetables are the best source of vitamins and minerals. Vegetables also provide the perfect balance of protein. Processed food has little nutritional value, so try to avoid this.  Medications  Resume taking all of your medications. If your incision is causing pain, you may take over-the counter pain relievers such as acetaminophen (Tylenol). If you were prescribed a stronger pain medication, please be aware these medications can cause nausea and constipation. Prevent  nausea by taking the medication with a snack or meal. Avoid constipation by drinking plenty of fluids and eating foods with high amount of fiber, such as fruits, vegetables, and grains.  Do not take Tylenol if you are taking prescription pain medications.  Follow up Your surgeon may want to see you in the office following your access surgery. If so, this will be arranged at the time of your surgery.  Please call us immediately for any of the following conditions:  Increased pain, redness, drainage (pus) from your incision site Fever of 101 degrees or higher Severe or worsening pain at your incision site Hand pain or numbness.  Reduce your risk of vascular disease:  Stop smoking. If you would like help, call [facility] at 1-800-QUIT-NOW (225-293-8327) or [place] at [phone]  Manage your cholesterol Maintain a desired weight Control your diabetes Keep your blood pressure down  Dialysis  It will take several weeks to several months for your new dialysis access to be ready for use. Your surgeon will determine when it is okay to use it. Your nephrologist will continue to direct your dialysis. You can continue to use your Permcath until your new access is ready for use.   01/22/2023 [name] [id number] 1933/11/23  Surgeon(s): [name], MD  Procedure(s): LEFT ARM BRACHIOBASILISC ARTERIOVENOUS (AV) FISTULA CREATION   May stick graft immediately   May stick graft on designated area only:   X Do not stick left AV fistula for 12 weeks    If you have any questions, please call the  office at [phone].

## 2023-01-22 NOTE — H&P (Signed)
History and Physical Interval Note:  01/22/2023 8:57 AM  [name]  has presented today for surgery, with the diagnosis of Chronic Kidney Disease Stage V.  The various methods of treatment have been discussed with the patient and family. After consideration of risks, benefits and other options for treatment, the patient has consented to  Procedure(s): LEFT ARM ARTERIOVENOUS (AV) FISTULA CREATION (Left) as a surgical intervention.  The patient's history has been reviewed, patient examined, no change in status, stable for surgery.  I have reviewed the patient's chart and labs.  Questions were answered to the patient's satisfaction.    His left brachiocephalic fistula is thrombosed and did not mature.  This is pulsatile.  I have recommended new left arm access.  Discussed this would either be basilic vein which would require two stages of the operation or placing an AV graft if the basilic vein is not usable.  All questions answered.  [name]   POST OPERATIVE OFFICE NOTE       CC:  F/u for surgery   HPI:  [name] is a 89 y.o. male who is s/p left brachiocephalic AV fistula creation on 12/02/2022 by Dr. Chestine Spore.  This was done for permanent dialysis access.  The patient has a history of CKD stage V.    He returns today for a post op check.  He denies any issues with his incision such as drainage or erythema.  He denies any symptoms of steal in the left hand such as pain, excessive coldness, numbness, or weakness.   He is not on dialysis yet.      Allergies  No Known Allergies           Current Outpatient Medications  Medication Sig Dispense Refill   acetaminophen (TYLENOL) 500 MG tablet Take 1,000 mg by mouth every 8 (eight) hours as needed for moderate pain.       amLODipine (NORVASC) 10 MG tablet TAKE 1 TABLET EVERY DAY (APPOINTMENT IS NEEDED FOR REFILLS) 90 tablet 3   amoxicillin-clavulanate (AUGMENTIN) 875-125 MG tablet Take 1 tablet by mouth 2 (two) times  daily. 7 day supply       atorvastatin (LIPITOR) 10 MG tablet TAKE 1 TABLET EVERY DAY (Patient taking differently: Take 10 mg by mouth at bedtime.) 90 tablet 3   Blood Glucose Monitoring Suppl (ACCU-CHEK AVIVA PLUS) w/Device KIT Test blood sugar 3 times daily. Dx code: E11.22 1 kit 0   Blood Pressure Monitoring (BLOOD PRESS MONITOR/M-L CUFF) MISC 1 application by Does not apply route daily. 1 each 0   calcitRIOL (ROCALTROL) 0.25 MCG capsule Take 0.25 mcg by mouth daily.       carvedilol (COREG) 25 MG tablet TAKE 1 TABLET TWICE DAILY. KEEP OFFICE VISIT 180 tablet 3   Cholecalciferol (VITAMIN D-3 PO) Take 1 tablet by mouth daily.       Continuous Blood Gluc Sensor (FREESTYLE LIBRE SENSOR SYSTEM) MISC 1 application by Does not apply route daily. 1 each 2   Continuous Glucose Monitor Sup KIT 1 kit by Does not apply route as directed. 1 kit 0   furosemide (LASIX) 40 MG tablet TAKE 1 TABLET TWICE DAILY 180 tablet 10   glucose blood (ACCU-CHEK AVIVA PLUS) test strip Test blood sugar 3 times daily. Dx code: E11.22 300 each 3   insulin NPH-regular Human (NOVOLIN 70/30) (70-30) 100 UNIT/ML injection Inject 11 Units into the skin 2 (two) times daily with a meal. (Patient taking differently: Inject 8 Units into the  skin 2 (two) times daily with a meal.) 15 mL 5   Insulin Syringe-Needle U-100 (B-D INS SYR HALF-UNIT .3CC/31G) 31G X 5/16" 0.3 ML MISC Use daily at bedtime to inject insulin. Dx code: 250.00 100 each 3   isosorbide mononitrate (IMDUR) 60 MG 24 hr tablet Take 2 tablets (120 mg total) by mouth daily. (Patient taking differently: Take 60 mg by mouth in the morning and at bedtime.) 180 tablet 2   Lancets (ACCU-CHEK MULTICLIX) lancets Test blood sugar 3 times daily. Dx code: E11.22 300 each 3   Multiple Vitamin (MULTIVITAMIN) tablet Take 1 tablet by mouth daily.       Omega-3 Fatty Acids (FISH OIL PO) Take 1 capsule by mouth daily.       oxyCODONE-acetaminophen (PERCOCET) 5-325 MG tablet Take 1 tablet by  mouth every 4 (four) hours as needed for severe pain. 10 tablet 0   Polyethyl Glycol-Propyl Glycol (SYSTANE) 0.4-0.3 % SOLN Place 1 drop into both eyes as needed (dry eyes).       sodium bicarbonate 650 MG tablet Take 650 mg by mouth daily.       tamsulosin (FLOMAX) 0.4 MG CAPS capsule Take 0.4 mg by mouth daily.          No current facility-administered medications for this visit.         ROS:  See HPI   Physical Exam:     Dialysis Duplex: 01/07/2023 +--------------------+----------+-----------------+--------+  AVF                PSV (cm/s)Flow Vol (mL/min)Comments  +--------------------+----------+-----------------+--------+  Native artery inflow   140           535                 +--------------------+----------+-----------------+--------+  AVF Anastomosis        178                               +--------------------+----------+-----------------+--------+     +------------+----------+-------------+----------+-------------------------  ----+  OUTFLOW VEINPSV (cm/s)Diameter (cm)Depth (cm)          Describe              +------------+----------+-------------+----------+-------------------------  ----+  Shoulder       28        0.40        0.94                                   +------------+----------+-------------+----------+-------------------------  ----+  Prox UA         32        0.42        0.56                                   +------------+----------+-------------+----------+-------------------------  ----+  Mid UA          21        0.46        0.69    competing branch  measuring  0.18cm. patent           +------------+----------+-------------+----------+-------------------------  ----+  Dist UA         0         0.70        0.21    competing branch  measuring                                                      0.39cm. thrombosed          +------------+----------+-------------+----------+-------------------------  ----+  AC Fossa       310        0.92        0.16                                     Incision: Well-healed left AC fossa incision without dehiscence, erythema, or hematoma Extremities: Pulsatile left brachiocephalic fistula in the AC fossa.  No thrill or bruit in the fistula past the [facility] fossa.  2+ left radial pulse Neuro: intact motor and sensation of LUE       Assessment/Plan:  This is a 89 y.o. male who is here for a post op check   -The patient underwent left brachiocephalic fistula creation on 12/02/2022 by Dr. Chestine Spore.  This was done for permanent dialysis access in the setting of CKD 5.  The patient is currently not on dialysis -The patient's left AC fossa incision is well-healed without signs of infection or hematoma.  He denies any symptoms of steal in the left hand such as weakness, numbness, excessive pain, or coldness.  He has a 2+ left radial pulse -Unfortunately duplex demonstrates an occluded left brachiocephalic fistula.  Physical exam is supportive of this duplex finding.  The fistula is pulsatile in the [facility] fossa and has no thrill or bruit in it proximally -I discussed this case with Dr. Chestine Spore.  The best course of action for the patient would be to plan for left basilic vein fistula creation.  I explained to the patient the risks and benefits of this procedure.  I explained he will need a second procedure for superficialization and transposition.  We will schedule this in the next few weeks with Dr. Chestine Spore.  All the patient's and family's questions have been answered.     Loel Dubonnet, PA-C [facility] [phone]     Clinic MD:  Steve Rattler

## 2023-01-22 NOTE — Anesthesia Procedure Notes (Signed)
Anesthesia Regional Block: Supraclavicular block   Pre-Anesthetic Checklist: , timeout performed,  Correct Patient, Correct Site, Correct Laterality,  Correct Procedure, Correct Position, site marked,  Risks and benefits discussed,  Surgical consent,  Pre-op evaluation,  At surgeon's request and post-op pain management  Laterality: Left  Prep: chloraprep       Needles:  Injection technique: Single-shot  Needle Type: Echogenic Stimulator Needle     Needle Length: 9cm  Needle Gauge: 21     Additional Needles:   Procedures:,,,, ultrasound used (permanent image in chart),,    Narrative:  Start time: 01/22/2023 10:00 AM End time: 01/22/2023 10:05 AM Injection made incrementally with aspirations every 5 mL.  Performed by: Personally  Anesthesiologist: Shelton Silvas, MD  Additional Notes: Discussed risks and benefits of the nerve block in detail, including but not limited vascular injury, permanent nerve damage and infection.   Patient tolerated the procedure well. Local anesthetic introduced in an incremental fashion under minimal resistance after negative aspirations. No paresthesias were elicited. After completion of the procedure, no acute issues were identified and patient continued to be monitored by RN.

## 2023-01-22 NOTE — Anesthesia Postprocedure Evaluation (Signed)
Anesthesia Post Note  Patient: Pauline Good  Procedure(s) Performed: LEFT ARM BRACHIOBASILISC ARTERIOVENOUS (AV) FISTULA CREATION (Left: Arm Upper)     Patient location during evaluation: PACU Anesthesia Type: Regional Level of consciousness: awake and alert Pain management: pain level controlled Vital Signs Assessment: post-procedure vital signs reviewed and stable Respiratory status: spontaneous breathing, nonlabored ventilation, respiratory function stable and patient connected to nasal cannula oxygen Cardiovascular status: stable and blood pressure returned to baseline Postop Assessment: no apparent nausea or vomiting Anesthetic complications: no  No notable events documented.  Last Vitals:  Vitals:   01/22/23 1200 01/22/23 1215  BP: (!) 127/57   Pulse: 75   Resp: 17   Temp:  36.5 C  SpO2: 94%     Last Pain:  Vitals:   01/22/23 1154  TempSrc:   PainSc: 0-No pain                 Shelton Silvas

## 2023-01-22 NOTE — Op Note (Signed)
OPERATIVE NOTE  DATE: January 22, 2023  PROCEDURE: left first stage basilic vein transposition (brachiobasilic arteriovenous fistula) placement  PRE-OPERATIVE DIAGNOSIS: CKD stage 5  POST-OPERATIVE DIAGNOSIS: same  SURGEON: [name], MD  ASSISTANT(S): Nathanial Rancher, PA  ANESTHESIA: regional  ESTIMATED BLOOD LOSS: Minimal  FINDING(S): 1.  Basilic vein: 3 mm, acceptable 2.  Brachial artery: 6 mm, atherosclerotic disease evident 3.  Venous outflow: palpable thrill  4.  Radial flow: palpable radial pulse  SPECIMEN(S):  none  INDICATIONS:   [name] is a 89 y.o. male who presents with CKD stage 5 and the need for permanent hemodialysis access.  The patient is scheduled for left arm AVF versus AVG.  The patient is aware the risks include but are not limited to: bleeding, infection, steal syndrome, nerve damage, ischemic monomelic neuropathy, failure to mature, and need for additional procedures.  The patient is aware of the risks of the procedure and elects to proceed forward.  An assistant was needed given the complexity of the case and to sew the anastomosis.   DESCRIPTION: After full informed written consent was obtained from the patient, the patient was brought back to the operating room and placed supine upon the operating table.  Prior to induction, the patient received IV antibiotics.   After obtaining adequate anesthesia, the patient was then prepped and draped in the standard fashion for a left arm access procedure.  I turned my attention first to identifying the patient's basilic vein and this looked adequate above the elbow.   I made a transverse incision at the level of the antecubitum and dissected through the subcutaneous tissue and fascia to gain exposure of the brachial artery.  This was noted to be 6 mm in diameter externally.  This was dissected out proximally and distally and controlled with vessel loops .  I then dissected out the basilic vein.   This was noted to be 3 mm in diameter externally.  The distal segment of the vein was ligated with a  2-0 silk, and the vein was transected.  The proximal segment was interrogated with serial dilators.  The vein accepted up to a 5 mm dilator without any difficulty.  I then instilled the heparinized saline into the vein and clamped it.  At this point, I reset my exposure of the brachial artery.  The patient was given 5,000 units IV heparin.  I then placed the artery under tension proximally and distally.  I made an arteriotomy with a #11 blade, and then I extended the arteriotomy with a Potts scissor.  I injected heparinized saline proximal and distal to this arteriotomy.  The vein was then sewn to the artery in an end-to-side configuration with a running stitch of 6-0 Prolene.  Prior to completing this anastomosis, I allowed the vein and artery to backbleed.  There was no evidence of clot from any vessels.  I completed the anastomosis in the usual fashion and then released all vessel loops and clamps.    There was a palpable thrill in the venous outflow, and there was a palpable radial pulse.  At this point, I irrigated out the surgical wound.  There was no further active bleeding.  The subcutaneous tissue was reapproximated with a running stitch of 3-0 Vicryl.  The skin was then reapproximated with a running subcuticular stitch of 4-0 Monocryl.  The skin was then cleaned, dried, and reinforced with Dermabond.  The patient tolerated this procedure well.   COMPLICATIONS: None  CONDITION: Stable  [name] MD [facility] Office: [phone]  [name]   01/22/2023, 11:50 AM

## 2023-01-22 NOTE — Anesthesia Preprocedure Evaluation (Addendum)
Anesthesia Evaluation  Patient identified by MRN, date of birth, ID band Patient awake    Reviewed: Allergy & Precautions, NPO status , Patient's Chart, lab work & pertinent test results  Airway Mallampati: I  TM Distance: >3 FB Neck ROM: Full    Dental  (+) Edentulous Upper, Edentulous Lower   Pulmonary former smoker   breath sounds clear to auscultation       Cardiovascular hypertension, Pt. on medications and Pt. on home beta blockers + CAD, + Cardiac Stents and +CHF   Rhythm:Regular Rate:Normal     Neuro/Psych negative neurological ROS  negative psych ROS   GI/Hepatic negative GI ROS, Neg liver ROS,,,  Endo/Other  diabetes, Type 2, Insulin Dependent    Renal/GU Renal disease     Musculoskeletal  (+) Arthritis ,    Abdominal   Peds  Hematology  (+) Blood dyscrasia, anemia   Anesthesia Other Findings   Reproductive/Obstetrics                             Anesthesia Physical Anesthesia Plan  ASA: 3  Anesthesia Plan: Regional   Post-op Pain Management: Minimal or no pain anticipated   Induction: Intravenous  PONV Risk Score and Plan: 0 and Propofol infusion  Airway Management Planned: Natural Airway  Additional Equipment: None  Intra-op Plan:   Post-operative Plan:   Informed Consent: I have reviewed the patients History and Physical, chart, labs and discussed the procedure including the risks, benefits and alternatives for the proposed anesthesia with the patient or authorized representative who has indicated his/her understanding and acceptance.       Plan Discussed with: CRNA  Anesthesia Plan Comments:        Anesthesia Quick Evaluation

## 2023-01-23 ENCOUNTER — Encounter (HOSPITAL_COMMUNITY): Payer: Self-pay | Admitting: Vascular Surgery

## 2023-01-24 ENCOUNTER — Other Ambulatory Visit (HOSPITAL_COMMUNITY): Payer: Self-pay

## 2023-01-27 ENCOUNTER — Ambulatory Visit (HOSPITAL_COMMUNITY)
Admission: RE | Admit: 2023-01-27 | Discharge: 2023-01-27 | Disposition: A | Discharge status: Home / Self Care | Payer: Medicare HMO | Source: Ambulatory Visit | Attending: Nephrology | Admitting: Nephrology

## 2023-01-27 VITALS — BP 158/64 | HR 69 | Temp 97.3°F | Resp 17

## 2023-01-27 DIAGNOSIS — N1832 Chronic kidney disease, stage 3b: Secondary | ICD-10-CM | POA: Diagnosis present

## 2023-01-27 DIAGNOSIS — N179 Acute kidney failure, unspecified: Secondary | ICD-10-CM | POA: Insufficient documentation

## 2023-01-27 LAB — POCT HEMOGLOBIN-HEMACUE: Hemoglobin: 9 g/dL — ABNORMAL LOW (ref 13.0–17.0)

## 2023-01-27 MED ORDER — EPOETIN ALFA-EPBX 10000 UNIT/ML IJ SOLN
10000.0000 [IU] | INTRAMUSCULAR | Status: DC
  Administered 2023-01-27: 10000 [IU] via SUBCUTANEOUS

## 2023-01-27 MED ORDER — EPOETIN ALFA-EPBX 10000 UNIT/ML IJ SOLN
INTRAMUSCULAR | Status: AC
  Filled 2023-01-27: qty 1

## 2023-01-27 MED ORDER — SODIUM CHLORIDE 0.9 % IV SOLN
510.0000 mg | Freq: Once | INTRAVENOUS | Status: AC
  Administered 2023-01-27: 510 mg via INTRAVENOUS
  Filled 2023-01-27: qty 510

## 2023-01-29 ENCOUNTER — Telehealth: Payer: Self-pay

## 2023-01-29 NOTE — Telephone Encounter (Signed)
Pt's wife, Larene Beach, called stating that the pt was having swelling post surgery and was requesting advice.  Reviewed pt's chart, returned call for clarification, two identifiers used. Pt had no other complaints. Pt had been elevating, but only using 1 pillow. Instructed him to use several pillows to get his arm above the level of his heart. Instructed him to use a light ACE wrap if necessary. Confirmed understanding.

## 2023-02-04 ENCOUNTER — Telehealth: Payer: Self-pay

## 2023-02-04 NOTE — Telephone Encounter (Signed)
Returned call from pt's wife regarding refill on narcotics. I spoke to pt who is still experiencing some swelling. He does not sound as if he's propping his arm above heart level. I have encouraged him to do this with a few pillows and to try XS Tylenol. He is agreeable to this and will call us back if it does not help.

## 2023-02-07 ENCOUNTER — Other Ambulatory Visit: Payer: Self-pay

## 2023-02-07 ENCOUNTER — Telehealth: Payer: Self-pay | Admitting: Cardiovascular Disease

## 2023-02-07 ENCOUNTER — Encounter (HOSPITAL_COMMUNITY): Payer: Self-pay | Admitting: Emergency Medicine

## 2023-02-07 ENCOUNTER — Emergency Department (HOSPITAL_COMMUNITY): Payer: Medicare HMO

## 2023-02-07 ENCOUNTER — Emergency Department (HOSPITAL_COMMUNITY)
Admission: EM | Admit: 2023-02-07 | Discharge: 2023-02-07 | Discharge status: Against Medical Advice | Payer: Medicare HMO | Source: Home / Self Care

## 2023-02-07 DIAGNOSIS — R0602 Shortness of breath: Secondary | ICD-10-CM | POA: Insufficient documentation

## 2023-02-07 DIAGNOSIS — Z5321 Procedure and treatment not carried out due to patient leaving prior to being seen by health care provider: Secondary | ICD-10-CM | POA: Insufficient documentation

## 2023-02-07 DIAGNOSIS — I132 Hypertensive heart and chronic kidney disease with heart failure and with stage 5 chronic kidney disease, or end stage renal disease: Secondary | ICD-10-CM | POA: Diagnosis not present

## 2023-02-07 DIAGNOSIS — R079 Chest pain, unspecified: Secondary | ICD-10-CM | POA: Insufficient documentation

## 2023-02-07 LAB — CBC
HCT: 30.7 % — ABNORMAL LOW (ref 39.0–52.0)
Hemoglobin: 9.4 g/dL — ABNORMAL LOW (ref 13.0–17.0)
MCH: 26.5 pg (ref 26.0–34.0)
MCHC: 30.6 g/dL (ref 30.0–36.0)
MCV: 86.5 fL (ref 80.0–100.0)
Platelets: 229 10*3/uL (ref 150–400)
RBC: 3.55 MIL/uL — ABNORMAL LOW (ref 4.22–5.81)
RDW: 18 % — ABNORMAL HIGH (ref 11.5–15.5)
WBC: 8 10*3/uL (ref 4.0–10.5)
nRBC: 0 % (ref 0.0–0.2)

## 2023-02-07 LAB — BASIC METABOLIC PANEL
Anion gap: 13 (ref 5–15)
BUN: 77 mg/dL — ABNORMAL HIGH (ref 8–23)
CO2: 16 mmol/L — ABNORMAL LOW (ref 22–32)
Calcium: 9.1 mg/dL (ref 8.9–10.3)
Chloride: 110 mmol/L (ref 98–111)
Creatinine, Ser: 6.54 mg/dL — ABNORMAL HIGH (ref 0.61–1.24)
GFR, Estimated: 8 mL/min — ABNORMAL LOW (ref >60)
Glucose, Bld: 123 mg/dL — ABNORMAL HIGH (ref 70–99)
Potassium: 4.4 mmol/L (ref 3.5–5.1)
Sodium: 139 mmol/L (ref 135–145)

## 2023-02-07 LAB — TROPONIN I (HIGH SENSITIVITY): Troponin I (High Sensitivity): 19 ng/L — ABNORMAL HIGH (ref <18)

## 2023-02-07 NOTE — Telephone Encounter (Signed)
Pt c/o Shortness Of Breath: STAT if SOB developed within the last 24 hours or pt is noticeably SOB on the phone  1. Are you currently SOB (can you hear that pt is SOB on the phone)?   Yes  2. How long have you been experiencing SOB?  Ongoing for about a week  3. Are you SOB when sitting or when up moving around?   When up and moving around  4. Are you currently experiencing any other symptoms?   Every now and then a slight chest pain  Wife ([name]) stated patient is concerned about having SOB when he walks short distances.

## 2023-02-07 NOTE — Telephone Encounter (Signed)
Wife called back and states she is going to take him to the ER at [facility]

## 2023-02-07 NOTE — Telephone Encounter (Signed)
Spoke to patient with wife on the phone. He report increased SOB for over 1 week. It is worse when up walking around. He also stated he does have chest pain and dizziness occasionally but none at this time. Per his wife he is breathing heavily while on the phone but no noted gasping for air. He deny any edema at this time.

## 2023-02-07 NOTE — ED Triage Notes (Signed)
Patient arrives ambulatory by POV with spouse c/o shortness of breath and chest pain with exertion x 1 week. Patient reports having to sleep in recliner for past 10 years. Patient speaking in full sentences.

## 2023-02-07 NOTE — ED Notes (Addendum)
Has decided to leave AMA stated "I cannot wait any longer"

## 2023-02-07 NOTE — Telephone Encounter (Signed)
For our information

## 2023-02-08 ENCOUNTER — Inpatient Hospital Stay (HOSPITAL_BASED_OUTPATIENT_CLINIC_OR_DEPARTMENT_OTHER)
Admission: EM | Admit: 2023-02-08 | Discharge: 2023-02-10 | DRG: 291 | Disposition: A | Discharge status: Home / Self Care | Payer: Medicare HMO | Attending: Internal Medicine | Admitting: Internal Medicine

## 2023-02-08 ENCOUNTER — Encounter (HOSPITAL_BASED_OUTPATIENT_CLINIC_OR_DEPARTMENT_OTHER): Payer: Self-pay | Admitting: Emergency Medicine

## 2023-02-08 ENCOUNTER — Emergency Department (HOSPITAL_BASED_OUTPATIENT_CLINIC_OR_DEPARTMENT_OTHER): Payer: Medicare HMO

## 2023-02-08 DIAGNOSIS — Z833 Family history of diabetes mellitus: Secondary | ICD-10-CM | POA: Diagnosis not present

## 2023-02-08 DIAGNOSIS — E1169 Type 2 diabetes mellitus with other specified complication: Secondary | ICD-10-CM | POA: Diagnosis present

## 2023-02-08 DIAGNOSIS — R918 Other nonspecific abnormal finding of lung field: Secondary | ICD-10-CM | POA: Diagnosis present

## 2023-02-08 DIAGNOSIS — Z87891 Personal history of nicotine dependence: Secondary | ICD-10-CM

## 2023-02-08 DIAGNOSIS — I509 Heart failure, unspecified: Secondary | ICD-10-CM

## 2023-02-08 DIAGNOSIS — I350 Nonrheumatic aortic (valve) stenosis: Secondary | ICD-10-CM | POA: Diagnosis present

## 2023-02-08 DIAGNOSIS — Z79899 Other long term (current) drug therapy: Secondary | ICD-10-CM

## 2023-02-08 DIAGNOSIS — Z794 Long term (current) use of insulin: Secondary | ICD-10-CM | POA: Diagnosis not present

## 2023-02-08 DIAGNOSIS — Z96651 Presence of right artificial knee joint: Secondary | ICD-10-CM | POA: Diagnosis present

## 2023-02-08 DIAGNOSIS — Z806 Family history of leukemia: Secondary | ICD-10-CM

## 2023-02-08 DIAGNOSIS — G8929 Other chronic pain: Secondary | ICD-10-CM | POA: Diagnosis present

## 2023-02-08 DIAGNOSIS — R0602 Shortness of breath: Principal | ICD-10-CM

## 2023-02-08 DIAGNOSIS — I5043 Acute on chronic combined systolic (congestive) and diastolic (congestive) heart failure: Secondary | ICD-10-CM | POA: Diagnosis present

## 2023-02-08 DIAGNOSIS — N185 Chronic kidney disease, stage 5: Secondary | ICD-10-CM | POA: Diagnosis present

## 2023-02-08 DIAGNOSIS — Z8249 Family history of ischemic heart disease and other diseases of the circulatory system: Secondary | ICD-10-CM

## 2023-02-08 DIAGNOSIS — R809 Proteinuria, unspecified: Secondary | ICD-10-CM

## 2023-02-08 DIAGNOSIS — Z9861 Coronary angioplasty status: Secondary | ICD-10-CM | POA: Diagnosis not present

## 2023-02-08 DIAGNOSIS — Z955 Presence of coronary angioplasty implant and graft: Secondary | ICD-10-CM | POA: Diagnosis not present

## 2023-02-08 DIAGNOSIS — E785 Hyperlipidemia, unspecified: Secondary | ICD-10-CM | POA: Diagnosis present

## 2023-02-08 DIAGNOSIS — I2510 Atherosclerotic heart disease of native coronary artery without angina pectoris: Secondary | ICD-10-CM | POA: Diagnosis present

## 2023-02-08 DIAGNOSIS — I10 Essential (primary) hypertension: Secondary | ICD-10-CM | POA: Diagnosis not present

## 2023-02-08 DIAGNOSIS — R7989 Other specified abnormal findings of blood chemistry: Secondary | ICD-10-CM

## 2023-02-08 DIAGNOSIS — Z800 Family history of malignant neoplasm of digestive organs: Secondary | ICD-10-CM

## 2023-02-08 DIAGNOSIS — N400 Enlarged prostate without lower urinary tract symptoms: Secondary | ICD-10-CM | POA: Diagnosis present

## 2023-02-08 DIAGNOSIS — I132 Hypertensive heart and chronic kidney disease with heart failure and with stage 5 chronic kidney disease, or end stage renal disease: Principal | ICD-10-CM | POA: Diagnosis present

## 2023-02-08 DIAGNOSIS — I5033 Acute on chronic diastolic (congestive) heart failure: Secondary | ICD-10-CM | POA: Diagnosis not present

## 2023-02-08 DIAGNOSIS — E872 Acidosis, unspecified: Secondary | ICD-10-CM | POA: Diagnosis present

## 2023-02-08 DIAGNOSIS — M898X9 Other specified disorders of bone, unspecified site: Secondary | ICD-10-CM | POA: Diagnosis present

## 2023-02-08 DIAGNOSIS — M549 Dorsalgia, unspecified: Secondary | ICD-10-CM | POA: Diagnosis present

## 2023-02-08 DIAGNOSIS — Z1152 Encounter for screening for COVID-19: Secondary | ICD-10-CM

## 2023-02-08 DIAGNOSIS — D631 Anemia in chronic kidney disease: Secondary | ICD-10-CM | POA: Diagnosis present

## 2023-02-08 DIAGNOSIS — E1122 Type 2 diabetes mellitus with diabetic chronic kidney disease: Secondary | ICD-10-CM | POA: Diagnosis present

## 2023-02-08 DIAGNOSIS — R0609 Other forms of dyspnea: Secondary | ICD-10-CM | POA: Diagnosis not present

## 2023-02-08 LAB — COMPREHENSIVE METABOLIC PANEL WITH GFR
ALT: 11 U/L (ref 0–44)
AST: 13 U/L — ABNORMAL LOW (ref 15–41)
Albumin: 3 g/dL — ABNORMAL LOW (ref 3.5–5.0)
Alkaline Phosphatase: 39 U/L (ref 38–126)
Anion gap: 10 (ref 5–15)
BUN: 74 mg/dL — ABNORMAL HIGH (ref 8–23)
CO2: 16 mmol/L — ABNORMAL LOW (ref 22–32)
Calcium: 8.4 mg/dL — ABNORMAL LOW (ref 8.9–10.3)
Chloride: 114 mmol/L — ABNORMAL HIGH (ref 98–111)
Creatinine, Ser: 6.46 mg/dL — ABNORMAL HIGH (ref 0.61–1.24)
GFR, Estimated: 8 mL/min — ABNORMAL LOW
Glucose, Bld: 162 mg/dL — ABNORMAL HIGH (ref 70–99)
Potassium: 4.3 mmol/L (ref 3.5–5.1)
Sodium: 140 mmol/L (ref 135–145)
Total Bilirubin: 0.6 mg/dL (ref 0.3–1.2)
Total Protein: 6.3 g/dL — ABNORMAL LOW (ref 6.5–8.1)

## 2023-02-08 LAB — BRAIN NATRIURETIC PEPTIDE: B Natriuretic Peptide: 1297.2 pg/mL — ABNORMAL HIGH (ref 0.0–100.0)

## 2023-02-08 LAB — CBC
HCT: 28.9 % — ABNORMAL LOW (ref 39.0–52.0)
Hemoglobin: 9.1 g/dL — ABNORMAL LOW (ref 13.0–17.0)
MCH: 26.5 pg (ref 26.0–34.0)
MCHC: 31.5 g/dL (ref 30.0–36.0)
MCV: 84.3 fL (ref 80.0–100.0)
Platelets: 225 K/uL (ref 150–400)
RBC: 3.43 MIL/uL — ABNORMAL LOW (ref 4.22–5.81)
RDW: 18 % — ABNORMAL HIGH (ref 11.5–15.5)
WBC: 7.9 K/uL (ref 4.0–10.5)
nRBC: 0 % (ref 0.0–0.2)

## 2023-02-08 LAB — TROPONIN I (HIGH SENSITIVITY): Troponin I (High Sensitivity): 24 ng/L — ABNORMAL HIGH

## 2023-02-08 LAB — GLUCOSE, CAPILLARY
Glucose-Capillary: 129 mg/dL — ABNORMAL HIGH (ref 70–99)
Glucose-Capillary: 124 mg/dL — ABNORMAL HIGH (ref 70–99)

## 2023-02-08 LAB — SARS CORONAVIRUS 2 BY RT PCR: SARS Coronavirus 2 by RT PCR: NEGATIVE

## 2023-02-08 MED ORDER — CALCITRIOL 0.25 MCG PO CAPS
0.2500 ug | ORAL_CAPSULE | Freq: Every day | ORAL | Status: DC
  Administered 2023-02-08 – 2023-02-10 (×3): 0.25 ug via ORAL
  Filled 2023-02-08 (×3): qty 1

## 2023-02-08 MED ORDER — FUROSEMIDE 10 MG/ML IJ SOLN
40.0000 mg | Freq: Once | INTRAMUSCULAR | Status: AC
  Administered 2023-02-08: 40 mg via INTRAVENOUS
  Filled 2023-02-08: qty 4

## 2023-02-08 MED ORDER — HEPARIN SODIUM (PORCINE) 5000 UNIT/ML IJ SOLN
5000.0000 [IU] | Freq: Three times a day (TID) | INTRAMUSCULAR | Status: DC
  Administered 2023-02-08 – 2023-02-10 (×7): 5000 [IU] via SUBCUTANEOUS
  Filled 2023-02-08 (×7): qty 1

## 2023-02-08 MED ORDER — FUROSEMIDE 10 MG/ML IJ SOLN
40.0000 mg | Freq: Two times a day (BID) | INTRAMUSCULAR | Status: DC
  Administered 2023-02-08 – 2023-02-10 (×4): 40 mg via INTRAVENOUS
  Filled 2023-02-08 (×4): qty 4

## 2023-02-08 MED ORDER — SENNOSIDES-DOCUSATE SODIUM 8.6-50 MG PO TABS: 1.0000 | ORAL_TABLET | Freq: Every evening | ORAL | Status: DC | PRN

## 2023-02-08 MED ORDER — ACETAMINOPHEN 650 MG RE SUPP: 650.0000 mg | Freq: Four times a day (QID) | RECTAL | Status: DC | PRN

## 2023-02-08 MED ORDER — ISOSORBIDE MONONITRATE ER 60 MG PO TB24
60.0000 mg | ORAL_TABLET | Freq: Every day | ORAL | Status: DC
  Administered 2023-02-08 – 2023-02-10 (×3): 60 mg via ORAL
  Filled 2023-02-08 (×3): qty 1

## 2023-02-08 MED ORDER — SODIUM CHLORIDE 0.9 % IV SOLN
1.0000 g | Freq: Once | INTRAVENOUS | Status: AC
  Administered 2023-02-08: 1 g via INTRAVENOUS
  Filled 2023-02-08: qty 10

## 2023-02-08 MED ORDER — AMLODIPINE BESYLATE 10 MG PO TABS
10.0000 mg | ORAL_TABLET | Freq: Every day | ORAL | Status: DC
  Administered 2023-02-08 – 2023-02-10 (×3): 10 mg via ORAL
  Filled 2023-02-08 (×3): qty 1

## 2023-02-08 MED ORDER — SODIUM BICARBONATE 650 MG PO TABS
1300.0000 mg | ORAL_TABLET | Freq: Every day | ORAL | Status: DC
  Administered 2023-02-08 – 2023-02-10 (×3): 1300 mg via ORAL
  Filled 2023-02-08 (×3): qty 2

## 2023-02-08 MED ORDER — TAMSULOSIN HCL 0.4 MG PO CAPS
0.4000 mg | ORAL_CAPSULE | Freq: Every day | ORAL | Status: DC
  Administered 2023-02-08 – 2023-02-10 (×3): 0.4 mg via ORAL
  Filled 2023-02-08 (×3): qty 1

## 2023-02-08 MED ORDER — INSULIN ASPART 100 UNIT/ML IJ SOLN
0.0000 [IU] | Freq: Every day | INTRAMUSCULAR | Status: DC
Start: 2023-02-08 — End: 2023-02-10

## 2023-02-08 MED ORDER — INSULIN ASPART 100 UNIT/ML IJ SOLN
0.0000 [IU] | Freq: Three times a day (TID) | INTRAMUSCULAR | Status: DC
  Administered 2023-02-09 – 2023-02-10 (×3): 2 [IU] via SUBCUTANEOUS

## 2023-02-08 MED ORDER — ONDANSETRON HCL 4 MG PO TABS: 4.0000 mg | ORAL_TABLET | Freq: Four times a day (QID) | ORAL | Status: DC | PRN

## 2023-02-08 MED ORDER — PATIROMER SORBITEX CALCIUM 8.4 G PO PACK
8.4000 g | PACK | ORAL | Status: DC
  Administered 2023-02-10: 8.4 g via ORAL
  Filled 2023-02-08 (×2): qty 1

## 2023-02-08 MED ORDER — ATORVASTATIN CALCIUM 10 MG PO TABS
10.0000 mg | ORAL_TABLET | Freq: Every day | ORAL | Status: DC
  Administered 2023-02-08 – 2023-02-09 (×2): 10 mg via ORAL
  Filled 2023-02-08 (×2): qty 1

## 2023-02-08 MED ORDER — ACETAMINOPHEN 325 MG PO TABS: 650.0000 mg | ORAL_TABLET | Freq: Four times a day (QID) | ORAL | Status: DC | PRN

## 2023-02-08 MED ORDER — CARVEDILOL 25 MG PO TABS
25.0000 mg | ORAL_TABLET | Freq: Two times a day (BID) | ORAL | Status: DC
  Administered 2023-02-08 – 2023-02-10 (×4): 25 mg via ORAL
  Filled 2023-02-08 (×4): qty 1

## 2023-02-08 MED ORDER — POLYVINYL ALCOHOL 1.4 % OP SOLN: 1.0000 [drp] | OPHTHALMIC | Status: DC | PRN

## 2023-02-08 MED ORDER — DEXTROSE 5 % IV SOLN
500.0000 mg | Freq: Once | INTRAVENOUS | Status: AC
  Administered 2023-02-08: 500 mg via INTRAVENOUS
  Filled 2023-02-08: qty 5

## 2023-02-08 MED ORDER — VITAMIN D 25 MCG (1000 UNIT) PO TABS
1000.0000 [IU] | ORAL_TABLET | Freq: Every day | ORAL | Status: DC
Start: 2023-02-08 — End: 2023-02-10
  Administered 2023-02-08 – 2023-02-10 (×3): 1000 [IU] via ORAL
  Filled 2023-02-08 (×4): qty 1

## 2023-02-08 MED ORDER — ONDANSETRON HCL 4 MG/2ML IJ SOLN: 4.0000 mg | Freq: Four times a day (QID) | INTRAMUSCULAR | Status: DC | PRN

## 2023-02-08 MED ORDER — OMEGA-3-ACID ETHYL ESTERS 1 G PO CAPS
2.0000 g | ORAL_CAPSULE | Freq: Every day | ORAL | Status: DC
  Administered 2023-02-08 – 2023-02-10 (×3): 2 g via ORAL
  Filled 2023-02-08 (×3): qty 2

## 2023-02-08 MED ORDER — SODIUM BICARBONATE 650 MG PO TABS: 650.0000 mg | ORAL_TABLET | Freq: Every day | ORAL | Status: DC

## 2023-02-08 NOTE — Plan of Care (Signed)

## 2023-02-08 NOTE — ED Provider Notes (Signed)
[place] [facility] Provider Note   CSN: [id number] Arrival date & time: 02/08/23  0932     History  Chief Complaint  Patient presents with   Shortness of Breath    [name] is a 89 y.o. male.  Pt with hx ckd, left av fistula in prep for needing dialysis, c/o sob in past week. ?long standing orthopnea. Denies chest pain currently although recent notes reflect some recent chest discomfort. No new or worsening cough - occasional non prod cough. No sore throat or runny nose. No known ill contacts. No fever or chills. Indicates is compliant w home meds and is urinating normally. No worsening leg swelling. Indicates has had persistent left arm swelling since his surgery. No fever or chills. Went to ED yesterday for same but wait was too long and left.   The history is provided by the patient, the spouse and medical records.  Shortness of Breath Associated symptoms: no abdominal pain, no chest pain, no fever, no headaches, no neck pain, no rash, no sore throat and no vomiting        Home Medications Prior to Admission medications   Medication Sig Start Date End Date Taking? Authorizing Provider  acetaminophen (TYLENOL) 500 MG tablet Take 1,000 mg by mouth every 8 (eight) hours as needed for moderate pain.    [provider]  amLODipine (NORVASC) 10 MG tablet TAKE 1 TABLET EVERY DAY (APPOINTMENT IS NEEDED FOR REFILLS) 07/29/22   Runell Gess, MD  atorvastatin (LIPITOR) 10 MG tablet TAKE 1 TABLET EVERY DAY Patient taking differently: Take 10 mg by mouth at bedtime. 05/06/22   Runell Gess, MD  Blood Glucose Monitoring Suppl (ACCU-CHEK AVIVA PLUS) w/Device KIT Test blood sugar 3 times daily. Dx code: E11.22 04/03/16   Shade Flood, MD  Blood Pressure Monitoring (BLOOD PRESS MONITOR/M-L CUFF) MISC 1 application by Does not apply route daily. 04/26/20   Shade Flood, MD  calcitRIOL (ROCALTROL) 0.25 MCG capsule Take 0.25  mcg by mouth daily.    [provider]  carvedilol (COREG) 25 MG tablet TAKE 1 TABLET TWICE DAILY. KEEP OFFICE VISIT 01/16/23   Runell Gess, MD  Cholecalciferol (VITAMIN D-3 PO) Take 1 tablet by mouth daily.    [provider]  Continuous Blood Gluc Sensor (FREESTYLE LIBRE SENSOR SYSTEM) MISC 1 application by Does not apply route daily. 06/30/20   Shade Flood, MD  Continuous Glucose Monitor Sup KIT 1 kit by Does not apply route as directed. 04/26/20   Shade Flood, MD  furosemide (LASIX) 40 MG tablet TAKE 1 TABLET TWICE DAILY 02/11/22   Runell Gess, MD  glucose blood (ACCU-CHEK AVIVA PLUS) test strip Test blood sugar 3 times daily. Dx code: E11.22 04/03/16   Shade Flood, MD  insulin NPH-regular Human (NOVOLIN 70/30) (70-30) 100 UNIT/ML injection Inject 11 Units into the skin 2 (two) times daily with a meal. Patient taking differently: Inject 8 Units into the skin in the morning. 06/30/20   Shade Flood, MD  Insulin Syringe-Needle U-100 (B-D INS SYR HALF-UNIT .3CC/31G) 31G X 5/16" 0.3 ML MISC Use daily at bedtime to inject insulin. Dx code: 250.00 06/21/13   Shade Flood, MD  isosorbide mononitrate (IMDUR) 60 MG 24 hr tablet Take 2 tablets (120 mg total) by mouth daily. Patient taking differently: Take 60 mg by mouth in the morning and at bedtime. 10/11/22   Marjie Skiff E, PA-C  Lancets (  ACCU-CHEK MULTICLIX) lancets Test blood sugar 3 times daily. Dx code: E11.22 04/03/16   Shade Flood, MD  Multiple Vitamin (MULTIVITAMIN) tablet Take 1 tablet by mouth daily.    [provider]  Omega-3 Fatty Acids (FISH OIL PO) Take 1 capsule by mouth daily.    [provider]  oxyCODONE-acetaminophen (PERCOCET) 5-325 MG tablet Take 1 tablet by mouth every 6 (six) hours as needed for severe pain (pain score 7-10). 01/22/23 01/22/24  Baglia, Corrina, PA-C  Polyethyl Glycol-Propyl Glycol (SYSTANE) 0.4-0.3 % SOLN Place 1 drop into both eyes as  needed (dry eyes).    [provider]  sodium bicarbonate 650 MG tablet Take 650 mg by mouth daily.    [provider]  tamsulosin (FLOMAX) 0.4 MG CAPS capsule Take 0.4 mg by mouth daily. 01/29/21   [provider]  VELTASSA 8.4 g packet Take 8.4 g by mouth every Monday, Wednesday, and Friday. 12/23/22   [provider]      Allergies    Patient has no known allergies.    Review of Systems   Review of Systems  Constitutional:  Negative for chills and fever.  HENT:  Negative for sore throat.   Eyes:  Negative for redness.  Respiratory:  Positive for shortness of breath.   Cardiovascular:  Negative for chest pain, palpitations and leg swelling.  Gastrointestinal:  Negative for abdominal pain, diarrhea and vomiting.  Genitourinary:  Negative for decreased urine volume, dysuria and flank pain.  Musculoskeletal:  Negative for back pain and neck pain.  Skin:  Negative for rash.  Neurological:  Negative for headaches.  Hematological:  Does not bruise/bleed easily.  Psychiatric/Behavioral:  Negative for confusion.     Physical Exam Updated Vital Signs BP (!) 149/64   Pulse 83   Temp 97.9 F (36.6 C) (Oral)   Resp 20   SpO2 97%  Physical Exam Vitals and nursing note reviewed.  Constitutional:      Appearance: Normal appearance. He is well-developed.  HENT:     Head: Atraumatic.     Nose: Nose normal.     Mouth/Throat:     Mouth: Mucous membranes are moist.     Pharynx: Oropharynx is clear.  Eyes:     General: No scleral icterus.    Conjunctiva/sclera: Conjunctivae normal.  Neck:     Trachea: No tracheal deviation.  Cardiovascular:     Rate and Rhythm: Normal rate and regular rhythm.     Pulses: Normal pulses.     Heart sounds: Normal heart sounds. No murmur heard.    No friction rub. No gallop.  Pulmonary:     Effort: Pulmonary effort is normal. No accessory muscle usage or respiratory distress.     Breath sounds: Normal breath sounds.   Abdominal:     General: Bowel sounds are normal. There is no distension.     Palpations: Abdomen is soft.     Tenderness: There is no abdominal tenderness.  Musculoskeletal:     Cervical back: Normal range of motion and neck supple. No rigidity.     Comments: Moderate LUE swelling. Av fistula w palp thrill and no sign of infection. Compartments of LUE are soft and not tense. Radial pulse 2+.  Mild, symmetric, ankle/lower leg swelling.   Skin:    General: Skin is warm and dry.     Findings: No rash.  Neurological:     Mental Status: He is alert.     Comments: Alert, speech clear.  Psychiatric:        Mood and Affect: Mood normal.     ED Results / Procedures / Treatments   Labs (all labs ordered are listed, but only abnormal results are displayed) Results for orders placed or performed during the hospital encounter of 02/08/23  SARS Coronavirus 2 by RT PCR (hospital order, performed in [facility] hospital lab) *cepheid single result test* Anterior Nasal Swab   Specimen: Anterior Nasal Swab  Result Value Ref Range   SARS Coronavirus 2 by RT PCR NEGATIVE NEGATIVE  CBC  Result Value Ref Range   WBC 7.9 4.0 - 10.5 K/uL   RBC 3.43 (L) 4.22 - 5.81 MIL/uL   Hemoglobin 9.1 (L) 13.0 - 17.0 g/dL   HCT 13.0 (L) 86.5 - 78.4 %   MCV 84.3 80.0 - 100.0 fL   MCH 26.5 26.0 - 34.0 pg   MCHC 31.5 30.0 - 36.0 g/dL   RDW 69.6 (H) 29.5 - 28.4 %   Platelets 225 150 - 400 K/uL   nRBC 0.0 0.0 - 0.2 %  Comprehensive metabolic panel  Result Value Ref Range   Sodium 140 135 - 145 mmol/L   Potassium 4.3 3.5 - 5.1 mmol/L   Chloride 114 (H) 98 - 111 mmol/L   CO2 16 (L) 22 - 32 mmol/L   Glucose, Bld 162 (H) 70 - 99 mg/dL   BUN 74 (H) 8 - 23 mg/dL   Creatinine, Ser 1.32 (H) 0.61 - 1.24 mg/dL   Calcium 8.4 (L) 8.9 - 10.3 mg/dL   Total Protein 6.3 (L) 6.5 - 8.1 g/dL   Albumin 3.0 (L) 3.5 - 5.0 g/dL   AST 13 (L) 15 - 41 U/L   ALT 11 0 - 44 U/L   Alkaline Phosphatase 39 38 - 126 U/L   Total  Bilirubin 0.6 0.3 - 1.2 mg/dL   GFR, Estimated 8 (L) >60 mL/min   Anion gap 10 5 - 15  Brain natriuretic peptide  Result Value Ref Range   B Natriuretic Peptide 1,297.2 (H) 0.0 - 100.0 pg/mL  Troponin I (High Sensitivity)  Result Value Ref Range   Troponin I (High Sensitivity) 24 (H) <18 ng/L   DG Chest 2 View  Result Date: 02/08/2023 CLINICAL DATA:  Worsening chest pain and shortness of breath for 1 week. EXAM: CHEST - 2 VIEW COMPARISON:  02/07/2023 and 11/07/2020 FINDINGS: The heart size and mediastinal contours are within normal limits. Bibasilar scarring and bilateral central peribronchial thickening again noted. Mild airspace opacity is seen in the posterior left lower lobe, suspicious for pneumonia. IMPRESSION: Mild posterior left lower lobe airspace opacity, suspicious for pneumonia. Bibasilar scarring and central peribronchial thickening. Electronically Signed   By: Danae Orleans M.D.   On: 02/08/2023 09:55   DG Chest 2 View  Result Date: 02/07/2023 CLINICAL DATA:  Shortness of breath and chest pain. EXAM: CHEST - 2 VIEW COMPARISON:  Radiographs 12/20/2020 and 11/07/2020.  CT 03/18/2016. FINDINGS: The heart size and mediastinal contours are stable with aortic atherosclerosis. Chronic pleural thickening at the left lung base with chronic left basilar scarring. Possible new small right pleural effusion with new patchy right basilar airspace disease and diffuse central airway thickening. No evidence of pneumothorax or confluent airspace disease. There are stable degenerative changes in the spine. IMPRESSION: 1. New diffuse central airway thickening with possible small right pleural effusion and patchy right basilar airspace disease. Findings may be secondary to superimposed bronchitis or edema. No confluent airspace disease. 2. Chronic left  basilar scarring and pleural thickening. Electronically Signed   By: Carey Bullocks M.D.   On: 02/07/2023 18:03     EKG EKG  Interpretation Date/Time:  Saturday February 08 2023 08:55:07 EDT Ventricular Rate:  79 PR Interval:  140 QRS Duration:  93 QT Interval:  371 QTC Calculation: 426 R Axis:   85  Text Interpretation: Sinus rhythm Non-specific ST-t changes Confirmed by Cathren Laine ([id number]) on 02/08/2023 9:06:42 AM  Radiology DG Chest 2 View  Result Date: 02/08/2023 CLINICAL DATA:  Worsening chest pain and shortness of breath for 1 week. EXAM: CHEST - 2 VIEW COMPARISON:  02/07/2023 and 11/07/2020 FINDINGS: The heart size and mediastinal contours are within normal limits. Bibasilar scarring and bilateral central peribronchial thickening again noted. Mild airspace opacity is seen in the posterior left lower lobe, suspicious for pneumonia. IMPRESSION: Mild posterior left lower lobe airspace opacity, suspicious for pneumonia. Bibasilar scarring and central peribronchial thickening. Electronically Signed   By: Danae Orleans M.D.   On: 02/08/2023 09:55   DG Chest 2 View  Result Date: 02/07/2023 CLINICAL DATA:  Shortness of breath and chest pain. EXAM: CHEST - 2 VIEW COMPARISON:  Radiographs 12/20/2020 and 11/07/2020.  CT 03/18/2016. FINDINGS: The heart size and mediastinal contours are stable with aortic atherosclerosis. Chronic pleural thickening at the left lung base with chronic left basilar scarring. Possible new small right pleural effusion with new patchy right basilar airspace disease and diffuse central airway thickening. No evidence of pneumothorax or confluent airspace disease. There are stable degenerative changes in the spine. IMPRESSION: 1. New diffuse central airway thickening with possible small right pleural effusion and patchy right basilar airspace disease. Findings may be secondary to superimposed bronchitis or edema. No confluent airspace disease. 2. Chronic left basilar scarring and pleural thickening. Electronically Signed   By: Carey Bullocks M.D.   On: 02/07/2023 18:03    Procedures Procedures     Medications Ordered in ED Medications  furosemide (LASIX) injection 40 mg (has no administration in time range)  cefTRIAXone (ROCEPHIN) 1 g in sodium chloride 0.9 % 100 mL IVPB (has no administration in time range)  azithromycin (ZITHROMAX) 500 mg in dextrose 5 % 250 mL IVPB (has no administration in time range)    ED Course/ Medical Decision Making/ A&P                                 Medical Decision Making Problems Addressed: Acute on chronic combined systolic and diastolic CHF (congestive heart failure) (HCC): acute illness or injury with systemic symptoms that poses a threat to life or bodily functions Elevated brain natriuretic peptide (BNP) level: acute illness or injury Essential hypertension: chronic illness or injury that poses a threat to life or bodily functions Pulmonary infiltrate: acute illness or injury with systemic symptoms SOB (shortness of breath): acute illness or injury with systemic symptoms that poses a threat to life or bodily functions Stage 5 chronic kidney disease not on chronic dialysis [facility]): chronic illness or injury with exacerbation, progression, or side effects of treatment that poses a threat to life or bodily functions  Amount and/or Complexity of Data Reviewed Independent Historian: spouse    Details: hx External Data Reviewed: notes. Labs: ordered. Decision-making details documented in ED Course. Radiology: ordered and independent interpretation performed. Decision-making details documented in ED Course. ECG/medicine tests: ordered and independent interpretation performed. Decision-making details documented in ED Course. Discussion of management or test interpretation  with external provider(s): medicine  Risk Prescription drug management. Decision regarding hospitalization.   Iv ns. Continuous pulse ox and cardiac monitoring. Labs ordered/sent. Imaging ordered.   Differential diagnosis includes chf, pna, etc. Dispo decision including  potential need for admission considered - will get labs and imaging and reassess.   Reviewed nursing notes and prior charts for additional history. External reports reviewed. Additional history from: spouse.   Cardiac monitor: sinus rhythm, rate 80.  Labs reviewed/interpreted by me - ckd c/w prior. Anemia c/w prior. Bnp is very high. Lasix iv.   Xrays reviewed/interpreted by me - vascular congestion. No def pna. (Pt denies fever/chills, denies prod cough).   Given ckd, chf, sob, will plan for admission. Hospitalists consulted for admission.            Final Clinical Impression(s) / ED Diagnoses Final diagnoses:  SOB (shortness of breath)  Acute on chronic combined systolic and diastolic CHF (congestive heart failure) (HCC)  Stage 5 chronic kidney disease not on chronic dialysis (HCC)  Elevated brain natriuretic peptide (BNP) level  Essential hypertension  Pulmonary infiltrate    Rx / DC Orders ED Discharge Orders     None         Cathren Laine, MD 02/08/23 1130

## 2023-02-08 NOTE — ED Notes (Signed)
PT advised he InO cath's himself twice a day, once at lunch and once at 2300hrs.   Wife endorsed pt got up two separate times last night, even after cathing at 2300hrs.

## 2023-02-08 NOTE — ED Notes (Signed)
Called [facility] for [employer]

## 2023-02-08 NOTE — ED Triage Notes (Signed)
Pt c/o c/o central CP shob, worsening with exertion x 1 week. 1 gram Tylenol 45 mins pta

## 2023-02-08 NOTE — ED Notes (Signed)
Fall risk armband Fall risk sign on door Patient wearing shoes

## 2023-02-08 NOTE — ED Notes (Addendum)
Pt was rolled to the bathroom. Moved bowels and urinated without incident. No remaining sensation to urinate.

## 2023-02-08 NOTE — Progress Notes (Signed)
Patient requested that I in and out cath him d/t urine retention. I had to use a coude cath 20 fr and I was able to obtain of urine

## 2023-02-08 NOTE — ED Notes (Signed)
Fall risk armband Fall sign on door Patient wearing shoes

## 2023-02-08 NOTE — ED Notes (Signed)
ED Provider at bedside. 

## 2023-02-08 NOTE — Plan of Care (Signed)
  Problem: Clinical Measurements: Goal: Ability to maintain clinical measurements within normal limits will improve 02/08/2023 1639 by Elnita Maxwell, RN Outcome: Progressing 02/08/2023 1637 by Elnita Maxwell, RN Outcome: Progressing   Problem: Cardiac: Goal: Ability to achieve and maintain adequate cardiopulmonary perfusion will improve Outcome: Progressing   Problem: Activity: Goal: Capacity to carry out activities will improve Outcome: Progressing   Problem: Education: Goal: Ability to verbalize understanding of medication therapies will improve Outcome: Progressing

## 2023-02-08 NOTE — H&P (Signed)
History and Physical    Patient: [name] WUJ:[id number] DOB: 18-Aug-1933 DOA: 02/08/2023 DOS: the patient was seen and examined on 02/08/2023 PCP: Bernadette Hoit, MD  Patient coming from: Med Center at [facility]  Chief Complaint:  Chief Complaint  Patient presents with   Shortness of Breath   HPI: [name] is a 89 y.o. male with medical history significant of CKD stage V not on HD, CAD s/p stent to LAD, HLD, T2DM, diastolic heart failure, HTN chronic back pain who initially presented to [facility] ED due to dyspnea on exertion. Patient reports that over the last week he has had progressive dyspnea on exertion with associated lower extremity swelling.  He reports getting out of breath just walking to his bathroom. He also reported associated orthopnea and occasional lightheadedness but denies any chest pain, palpitations, dysuria, abdominal pain, fever, chills or new cough. Of note, patient previously presented to the [facility] ED yesterday but left due to the long lines.  [facility] ED course: Hypertensive with SBP in the 140s to 180s but hemodynamically stable.  Labs showed WBC 7.9, Hgb 9.1, creatinine 6.46 (around baseline), CO2 16, BMP 1297, Trope 24, COVID test. CXR showed mild left lower lobe airway opacity concerning for possible pneumonia.  Status post 1 dose of IV Lasix 40 mg, IV Rocephin and azithromycin.  Patient was admitted to the hospitalist service and transferred to [facility].    Review of Systems: As mentioned in the history of present illness. All other systems reviewed and are negative. Past Medical History:  Diagnosis Date   Allergy    Anemia    CAD (coronary artery disease)    pci to LAD 10/09; stable CAD by cath 05/31/08; myoview 04/11/11- no ishcemia, EF 67%; echo 05/15/09- EF>55%, mod calcification of the aortic valve leaflets   CAD S/P percutaneous coronary angioplasty 05/08/2008   LAD PCI with DES 2009- Myoview low risk 2013   CHF (congestive  heart failure) (HCC) 09/07/2019   Chronic back pain    Chronic kidney disease    Cystitis, radiation 12/01/2013   ED (erectile dysfunction)    Elevated serum creatinine 12/14/2015   Essential hypertension, benign    Hematuria 12/01/2013   History of blood transfusion    Hyperlipidemia    Ileus (HCC) 12/14/2015   Insulin dependent type 2 diabetes mellitus, controlled (HCC)    Malignant neoplasm of prostate (HCC) 11/26/2013   Multiple rib fractures    left 9th, 10th, and 11th posterior rib fractures S/P fall 12/09/2015/notes 12/12/2015   Osteoarthritis of right knee 03/04/2018   Personal history of COVID-19 09/05/2021   PREMATURE VENTRICULAR CONTRACTIONS 05/08/2008   Qualifier: Diagnosis of  By: Johney Frame, MD, James     Prostate cancer [facility])    s/p   Rectal bleeding 12/23/2012   Evaluated by Dr Elnoria Howard GI patient with history of radiation proctitis. Patient due for colonoscopy on 04/2014    Type II or unspecified type diabetes mellitus without mention of complication, not stated as uncontrolled    Ventricular tachycardia (HCC)    resuscitated; monitor 05/2008; attempted T ablation 2/10- aborted due to inappropriate substrate   Past Surgical History:  Procedure Laterality Date   AV FISTULA PLACEMENT Left 12/02/2022   Procedure: LEFT ARM BRACHIOCEPHALIC ARTERIOVENOUS (AV) FISTULA CREATION;  Surgeon: [name], MD;  Location: [facility] OR;  Service: Vascular;  Laterality: Left;   AV FISTULA PLACEMENT Left 01/22/2023   Procedure: LEFT ARM BRACHIOBASILISC ARTERIOVENOUS (AV) FISTULA CREATION;  Surgeon: [name], MD;  Location: [facility] OR;  Service: Vascular;  Laterality: Left;   CARDIAC CATHETERIZATION  05/31/08   EF 55-60%, stable lesions, medical therapy   COLECTOMY  '80's   polyps   CORONARY ANGIOPLASTY WITH STENT PLACEMENT  01/19/08   PCI stent to mid LAD with Promus DES 27.5x28   CYSTOSCOPY WITH DIRECT VISION INTERNAL URETHROTOMY [name] 03/12/2021   Procedure: cystoscoopy withdirect  vision internal urethrotomy optilum urethral dilation     ;  Surgeon: Crista Elliot, MD;  Location: [facility] ORS;  Service: Urology;  Laterality: [name];  RERQUESTING 45 MINS   IR GENERIC HISTORICAL  01/03/2016   IR THORACENTESIS ASP PLEURAL SPACE W/IMG GUIDE 01/03/2016 [facility]-INTERV RAD   KNEE ARTHROPLASTY Right 03/04/2018   Procedure: COMPUTER ASSISTED TOTAL KNEE ARTHROPLASTY;  Surgeon: Samson Frederic, MD;  Location: [facility] ORS;  Service: Orthopedics;  Laterality: Right;   PTCA  01/2008   TRANSURETHRAL RESECTION OF BLADDER TUMOR [name] 11/10/2020   Procedure: Caprice Kluver;  Surgeon: Malen Gauze, MD;  Location: [facility] ORS;  Service: Urology;  Laterality: [name];   TRANSURETHRAL RESECTION OF BLADDER TUMOR [name] 08/12/2022   Procedure: TRANSURETHRAL RESECTION OF BLADDER TUMOR (TURBT);  Surgeon: Crista Elliot, MD;  Location: [facility] ORS;  Service: Urology;  Laterality: [name];   TRANSURETHRAL RESECTION OF BLADDER TUMOR WITH MITOMYCIN-C [name] 09/17/2021   Procedure: TRANSURETHRAL RESECTION OF BLADDER TUMOR WITH GEMCITABINE;  Surgeon: Crista Elliot, MD;  Location: [facility] ORS;  Service: Urology;  Laterality: [name];   Social History:  reports that he quit smoking about 20 years ago. His smoking use included cigarettes. He has never used smokeless tobacco. He reports current drug use. Drug: Marijuana. He reports that he does not drink alcohol.  No Known Allergies  Family History  Problem Relation Age of Onset   Diabetes Mother    Heart attack Mother    Leukemia Father    Heart disease Sister    Heart disease Brother    Diabetes Brother        pancreatic cancer    Prior to Admission medications   Medication Sig Start Date End Date Taking? Authorizing Provider  acetaminophen (TYLENOL) 500 MG tablet Take 1,000 mg by mouth every 8 (eight) hours as needed for moderate pain.    [provider]  amLODipine (NORVASC) 10 MG tablet TAKE 1 TABLET EVERY DAY (APPOINTMENT IS NEEDED FOR REFILLS)  07/29/22   Runell Gess, MD  atorvastatin (LIPITOR) 10 MG tablet TAKE 1 TABLET EVERY DAY Patient taking differently: Take 10 mg by mouth at bedtime. 05/06/22   Runell Gess, MD  Blood Glucose Monitoring Suppl (ACCU-CHEK AVIVA PLUS) w/Device KIT Test blood sugar 3 times daily. Dx code: E11.22 04/03/16   Shade Flood, MD  Blood Pressure Monitoring (BLOOD PRESS MONITOR/M-L CUFF) MISC 1 application by Does not apply route daily. 04/26/20   Shade Flood, MD  calcitRIOL (ROCALTROL) 0.25 MCG capsule Take 0.25 mcg by mouth daily.    [provider]  carvedilol (COREG) 25 MG tablet TAKE 1 TABLET TWICE DAILY. KEEP OFFICE VISIT 01/16/23   Runell Gess, MD  Cholecalciferol (VITAMIN D-3 PO) Take 1 tablet by mouth daily.    [provider]  Continuous Blood Gluc Sensor (FREESTYLE LIBRE SENSOR SYSTEM) MISC 1 application by Does not apply route daily. 06/30/20   Shade Flood, MD  Continuous Glucose Monitor Sup KIT 1 kit by Does not apply route as directed. 04/26/20  Shade Flood, MD  furosemide (LASIX) 40 MG tablet TAKE 1 TABLET TWICE DAILY 02/11/22   Runell Gess, MD  glucose blood (ACCU-CHEK AVIVA PLUS) test strip Test blood sugar 3 times daily. Dx code: E11.22 04/03/16   Shade Flood, MD  insulin NPH-regular Human (NOVOLIN 70/30) (70-30) 100 UNIT/ML injection Inject 11 Units into the skin 2 (two) times daily with a meal. Patient taking differently: Inject 8 Units into the skin in the morning. 06/30/20   Shade Flood, MD  Insulin Syringe-Needle U-100 (B-D INS SYR HALF-UNIT .3CC/31G) 31G X 5/16" 0.3 ML MISC Use daily at bedtime to inject insulin. Dx code: 250.00 06/21/13   Shade Flood, MD  isosorbide mononitrate (IMDUR) 60 MG 24 hr tablet Take 2 tablets (120 mg total) by mouth daily. Patient taking differently: Take 60 mg by mouth in the morning and at bedtime. 10/11/22   Marjie Skiff E, PA-C  Lancets (ACCU-CHEK MULTICLIX) lancets Test blood  sugar 3 times daily. Dx code: E11.22 04/03/16   Shade Flood, MD  Multiple Vitamin (MULTIVITAMIN) tablet Take 1 tablet by mouth daily.    [provider]  Omega-3 Fatty Acids (FISH OIL PO) Take 1 capsule by mouth daily.    [provider]  oxyCODONE-acetaminophen (PERCOCET) 5-325 MG tablet Take 1 tablet by mouth every 6 (six) hours as needed for severe pain (pain score 7-10). 01/22/23 01/22/24  Baglia, Corrina, PA-C  Polyethyl Glycol-Propyl Glycol (SYSTANE) 0.4-0.3 % SOLN Place 1 drop into both eyes as needed (dry eyes).    [provider]  sodium bicarbonate 650 MG tablet Take 650 mg by mouth daily.    [provider]  tamsulosin (FLOMAX) 0.4 MG CAPS capsule Take 0.4 mg by mouth daily. 01/29/21   [provider]  VELTASSA 8.4 g packet Take 8.4 g by mouth every Monday, Wednesday, and Friday. 12/23/22   [provider]    Physical Exam: Vitals:   02/08/23 0852 02/08/23 1200 02/08/23 1533 02/08/23 1542  BP: (!) 149/64 (!) 186/69  (!) 186/68  Pulse: 83 73  74  Resp: 20 (!) 21 18 18   Temp:  97.9 F (36.6 C)  97.9 F (36.6 C)  TempSrc:   Oral Oral  SpO2: 97% 95%  96%  Weight:    100.7 kg  Height:    6' (1.829 m)  General: Pleasant, well-appearing elderly man laying in bed. No acute distress. Neck: JVD to the mid neck CV: RRR. No murmurs, rubs, or gallops. 1+ BLE edema Pulmonary: Lungs CTAB. Normal effort. No wheezing or rales. Abdominal: Soft, nontender, nondistended. Normal bowel sounds. Extremities: Palpable radial and DP pulses. LUE AVF with palpable thrill and healed wound Skin: Warm and dry. No obvious rash or lesions. Neuro: A&Ox3. Moves all extremities. Normal sensation. No focal deficit. Psych: Normal mood and affect  Data Reviewed:  EKG shows sinus rhythm with nonspecific ST changes.  Assessment and Plan: [name] is a 89 y.o. male with medical history significant of CKD stage V not on HD, CAD s/p stent to LAD,  HLD, T2DM, diastolic heart failure, HTN chronic back pain who initially presented to [facility] ED due to dyspnea on exertion.  # Acute on chronic diastolic HF TTE in 08/2022 showed EF 60-65%, G2DD and moderate aortic stenosis. Currently on Lasix 40 mg twice daily and reports compliance.Patient reported with progressive dyspnea on exertion over the last week found to have radiological, laboratory and physical exam findings of fluid overloaded  state. He remains on room air with good O2 sats at rest. Status post IV Lasix 40 mg at [facility]. -Repeat TTE -IV Lasix 40 mg twice daily -Supplemental O2 as needed -Strict I&O's, daily weights  # CKD 5 # Metabolic acidosis Patient is follows closely with nephrology and vascular surgery. Peritoneal dialysis catheter placement was attempted earlier this year but was unsuccessfully because of adhesions.  He received AV fistula in August that was revised about a month ago.  He has palpable LUL AV fistula. Kidney function shows creatinine at baseline without electrolyte abnormalities. CO2 down to 16, K+ 4.3.  No urgent need for dialysis at the moment. -Increase bicarb from 650 to 1300 daily -Continue Calcitrol 0.25 mcg daily -Continue heart patiromer 8.4 g every MWF -Trend kidney function, electrolytes -Avoid nephrotoxic agents  # HTN Patient found to be hypertensive with SBP in the 140s to 180s. Reports he has not taking any of his BP meds today. -Resume amlodipine 10 mg daily, Coreg 25 mg twice daily and Imdur 60 mg daily  # T2DM Last A1c 7.0% 3 months ago.  Home regimen includes Novolin 70/30 8 units twice daily with meals.  Blood sugar 162 on CMP this morning.  Will start with sliding scale insulin and adjust as needed. -SSI with with meals, CBG monitoring -Repeat A1c  # CAD s/p stents to LAD # HLD  Stenting to LAD in 2009. He denies any chest pain. -Lipitor 10 mg daily and omega-3 fatty acid  # BPH -Flomax 0.4 mg daily    Advance Care Planning:   Code Status: Full Code   Consults: None  Family Communication: No family at bedside  Severity of Illness: The appropriate patient status for this patient is INPATIENT. Inpatient status is judged to be reasonable and necessary in order to provide the required intensity of service to ensure the patient's safety. The patient's presenting symptoms, physical exam findings, and initial radiographic and laboratory data in the context of their chronic comorbidities is felt to place them at high risk for further clinical deterioration. Furthermore, it is not anticipated that the patient will be medically stable for discharge from the hospital within 2 midnights of admission.   * I certify that at the point of admission it is my clinical judgment that the patient will require inpatient hospital care spanning beyond 2 midnights from the point of admission due to high intensity of service, high risk for further deterioration and high frequency of surveillance required.*  Author: Steffanie Rainwater, MD 02/08/2023 4:43 PM  For on call review [email or web address].

## 2023-02-08 NOTE — Progress Notes (Signed)
Per orders I notified MD that patient's BP was 182/74.

## 2023-02-09 ENCOUNTER — Inpatient Hospital Stay (HOSPITAL_COMMUNITY): Payer: Medicare HMO

## 2023-02-09 DIAGNOSIS — E785 Hyperlipidemia, unspecified: Secondary | ICD-10-CM

## 2023-02-09 DIAGNOSIS — E1169 Type 2 diabetes mellitus with other specified complication: Secondary | ICD-10-CM

## 2023-02-09 DIAGNOSIS — R0609 Other forms of dyspnea: Secondary | ICD-10-CM | POA: Diagnosis not present

## 2023-02-09 DIAGNOSIS — I2510 Atherosclerotic heart disease of native coronary artery without angina pectoris: Secondary | ICD-10-CM | POA: Diagnosis not present

## 2023-02-09 DIAGNOSIS — N185 Chronic kidney disease, stage 5: Secondary | ICD-10-CM | POA: Diagnosis not present

## 2023-02-09 DIAGNOSIS — Z9861 Coronary angioplasty status: Secondary | ICD-10-CM

## 2023-02-09 DIAGNOSIS — I5033 Acute on chronic diastolic (congestive) heart failure: Secondary | ICD-10-CM | POA: Diagnosis not present

## 2023-02-09 DIAGNOSIS — I10 Essential (primary) hypertension: Secondary | ICD-10-CM | POA: Diagnosis not present

## 2023-02-09 LAB — ECHOCARDIOGRAM COMPLETE
AR max vel: 1.1 cm2
AV Area VTI: 1.09 cm2
AV Area mean vel: 1.03 cm2
AV Mean grad: 24 mm[Hg]
AV Peak grad: 43.3 mm[Hg]
Ao pk vel: 3.29 m/s
Area-P 1/2: 3.03 cm2
Height: 72 in
MV M vel: 2.59 m/s
MV Peak grad: 26.8 mm[Hg]
MV VTI: 1.64 cm2
P 1/2 time: 464 ms
S' Lateral: 3.1 cm
Weight: 3512 [oz_av]

## 2023-02-09 LAB — CBC
HCT: 30 % — ABNORMAL LOW (ref 39.0–52.0)
Hemoglobin: 9.2 g/dL — ABNORMAL LOW (ref 13.0–17.0)
MCH: 25.6 pg — ABNORMAL LOW (ref 26.0–34.0)
MCHC: 30.7 g/dL (ref 30.0–36.0)
MCV: 83.6 fL (ref 80.0–100.0)
Platelets: 227 10*3/uL (ref 150–400)
RBC: 3.59 MIL/uL — ABNORMAL LOW (ref 4.22–5.81)
RDW: 17.5 % — ABNORMAL HIGH (ref 11.5–15.5)
WBC: 7.8 10*3/uL (ref 4.0–10.5)
nRBC: 0 % (ref 0.0–0.2)

## 2023-02-09 LAB — RENAL FUNCTION PANEL
Albumin: 2.9 g/dL — ABNORMAL LOW (ref 3.5–5.0)
Anion gap: 10 (ref 5–15)
BUN: 69 mg/dL — ABNORMAL HIGH (ref 8–23)
CO2: 15 mmol/L — ABNORMAL LOW (ref 22–32)
Calcium: 8.6 mg/dL — ABNORMAL LOW (ref 8.9–10.3)
Chloride: 117 mmol/L — ABNORMAL HIGH (ref 98–111)
Creatinine, Ser: 6.33 mg/dL — ABNORMAL HIGH (ref 0.61–1.24)
GFR, Estimated: 8 mL/min — ABNORMAL LOW (ref >60)
Glucose, Bld: 114 mg/dL — ABNORMAL HIGH (ref 70–99)
Phosphorus: 4.3 mg/dL (ref 2.5–4.6)
Potassium: 4.1 mmol/L (ref 3.5–5.1)
Sodium: 142 mmol/L (ref 135–145)

## 2023-02-09 LAB — GLUCOSE, CAPILLARY
Glucose-Capillary: 102 mg/dL — ABNORMAL HIGH (ref 70–99)
Glucose-Capillary: 122 mg/dL — ABNORMAL HIGH (ref 70–99)
Glucose-Capillary: 144 mg/dL — ABNORMAL HIGH (ref 70–99)

## 2023-02-09 LAB — HEMOGLOBIN A1C
Hgb A1c MFr Bld: 6 % — ABNORMAL HIGH (ref 4.8–5.6)
Mean Plasma Glucose: 125.5 mg/dL

## 2023-02-09 LAB — MAGNESIUM: Magnesium: 1.9 mg/dL (ref 1.7–2.4)

## 2023-02-09 NOTE — Hospital Course (Addendum)
[name] was admitted to the hospital with the working diagnosis of volume overload in the setting of advanced chronic renal disease.   89 yo male with the past medical history of CKD stage V sp AV fistula creation 12/02/22, coronary artery disease, hyperlipidemia, heart failure, and hypertension who presented with dyspnea on exertion. Reported worsening symptoms over the last week, associated with lower extremity edema, orthopnea and lightheadedness. On his initial physical examination his blood pressure was 186/69, HR 83, RR 21 and 02 saturation 96%, lungs with no wheezing or rhonchi, heart with S1 and S3 present and regular, with no gallops, or murmurs, abdomen with no distention and positive lower extremity edema.   Na 140, K 4,3 Cl 114 bicarbonate 16, glucose 162, bun 74 cr 6,46  BNP 1,297  High sensitive troponin 24  Wbc 7.9 hgb 9,1 plt 225  Sars covid 19 negative   Chest radiograph with cardiomegaly, bilateral hilar vascular congestion, bilateral central interstitial infiltrates, more dense at the left retrocardiac region, fluid in the right fissure and small bilateral pleural effusions.   EKG 79 bpm, normal axis, normal intervals, sinus rhythm with no significant ST segment changes, negative T wave lead III and AvF.   Patient was placed on IV furosemide with improvement in his volume status.  Renal function per serum cr has remained stable.   11/04 plan for discharge home today with torsemide in exchange to furosemide and instructions to take extra dose in case of volume overload. IV iron prior to discharge.  Follow up with Dr Malen Gauze from nephrology next week as scheduled.

## 2023-02-09 NOTE — Assessment & Plan Note (Signed)
Continue blood pressure control with carvedilol, amlodipine and isosorbide.  Diuresis with torsemide.

## 2023-02-09 NOTE — Plan of Care (Signed)
  Problem: Education: Goal: Knowledge of General Education information will improve Description: Including pain rating scale, medication(s)/side effects and non-pharmacologic comfort measures Outcome: Progressing   Problem: Health Behavior/Discharge Planning: Goal: Ability to manage health-related needs will improve Outcome: Progressing   Problem: Clinical Measurements: Goal: Ability to maintain clinical measurements within normal limits will improve Outcome: Progressing   Problem: Education: Goal: Ability to demonstrate management of disease process will improve Outcome: Progressing

## 2023-02-09 NOTE — Progress Notes (Signed)
  Echocardiogram 2D Echocardiogram has been performed.  Lucendia Herrlich 02/09/2023, 10:38 AM

## 2023-02-09 NOTE — Plan of Care (Signed)
  Problem: Education: Goal: Knowledge of General Education information will improve Description: Including pain rating scale, medication(s)/side effects and non-pharmacologic comfort measures Outcome: Progressing   Problem: Health Behavior/Discharge Planning: Goal: Ability to manage health-related needs will improve Outcome: Progressing   Problem: Clinical Measurements: Goal: Ability to maintain clinical measurements within normal limits will improve Outcome: Progressing Goal: Diagnostic test results will improve Outcome: Progressing Goal: Respiratory complications will improve Outcome: Progressing Goal: Cardiovascular complication will be avoided Outcome: Progressing   Problem: Activity: Goal: Risk for activity intolerance will decrease Outcome: Progressing   Problem: Nutrition: Goal: Adequate nutrition will be maintained Outcome: Progressing   Problem: Coping: Goal: Level of anxiety will decrease Outcome: Progressing   Problem: Elimination: Goal: Will not experience complications related to urinary retention Outcome: Progressing

## 2023-02-09 NOTE — Assessment & Plan Note (Addendum)
Volume status has improved along with his symptoms.  Urine output is 1,200 ml Renal function with serum cr at 6.3 with K at 4,1 and serum bicarbonate at 15.  Na 142 and Mg 1,9   Plan to continue furosemide 40 mg IV bid to further target negative fluid balance. Likely transition to oral diuretic therapy tomorrow. Follow up renal function and electrolytes in am.   Metabolic bone disease continue with calcitril, vitamin D3.  Oral bicarbonate for metabolic acidosis.   Patiromer M-W-F to prevent hyperkalemia.

## 2023-02-09 NOTE — Assessment & Plan Note (Signed)
No chest pain, no acute coronary syndrome.  

## 2023-02-09 NOTE — Assessment & Plan Note (Addendum)
Continue blood glucose control with insulin sliding scale.  Holding on basal insulin due to risk of hypoglycemia.  Fasting glucose today 75 mg/dl.   Patient will continue home insulin 70/30 at a reduced dose of 8 units bid with close follow up on capillary glucose.  Continue with atorvastatin.

## 2023-02-09 NOTE — Progress Notes (Addendum)
Progress Note   Patient: [name] [id number]:[id number] DOB: 02-May-1933 DOA: 02/08/2023     1 DOS: the patient was seen and examined on 02/09/2023   Brief hospital course: [name] was admitted to the hospital with the working diagnosis of volume overload in the setting of advanced chronic renal disease.   89 yo male with the past medical history of CKD stage V sp AV fistula creation 12/02/22, coronary artery disease, hyperlipidemia, heart failure, and hypertension who presented with dyspnea on exertion. Reported worsening symptoms over the last week, associated with lower extremity edema, orthopnea and lightheadedness. On his initial physical examination his blood pressure was 186/69, HR 83, RR 21 and 02 saturation 96%, lungs with no wheezing or rhonchi, heart with S1 and S3 present and regular, with no gallops, or murmurs, abdomen with no distention and positive lower extremity edema.   Na 140, K 4,3 Cl 114 bicarbonate 16, glucose 162, bun 74 cr 6,46  BNP 1,297  High sensitive troponin 24  Wbc 7.9 hgb 9,1 plt 225  Sars covid 19 negative   Chest radiograph with cardiomegaly, bilateral hilar vascular congestion, bilateral central interstitial infiltrates, more dense at the left retrocardiac region, fluid in the right fissure and small bilateral pleural effusions.   EKG 79 bpm, normal axis, normal intervals, sinus rhythm with no significant ST segment changes, negative T wave lead III and AvF.      Assessment and Plan: * Stage 5 chronic kidney disease not on chronic dialysis (HCC) Volume status has improved along with his symptoms.  Urine output is 1,200 ml Renal function with serum cr at 6.3 with K at 4,1 and serum bicarbonate at 15.  Na 142 and Mg 1,9   Plan to continue furosemide 40 mg IV bid to further target negative fluid balance. Likely transition to oral diuretic therapy tomorrow. Follow up renal function and electrolytes in am.   Metabolic bone disease continue with  calcitril, vitamin D3.  Oral bicarbonate for metabolic acidosis.   Patiromer M-W-F to prevent hyperkalemia.   Acute exacerbation of congestive heart failure (HCC) Echocardiogram with preserved LV systolic function with EF 70 to 75%, mild LVH, RV systolic function preserved, RVSP 53,2 mmHg, LA and RA with moderate dilatation, trivial pericardial effusion, mild mitral valve stenosis, mild TR, moderate aortic stenosis.   Volume status has improved. Systolic blood pressure 165 to 189 mmHg.   Plan to continue diuresis.  Continue carvedilol and isosorbide.  Blood pressure control with amlodipine.   CAD S/P percutaneous coronary angioplasty No chest pain, no acute coronary syndrome.   Essential hypertension Continue blood pressure control with carvedilol, amlodipine and isosorbide.  Diuresis with furosemide.  If persistent high blood pressure may add hydralazine.   Type 2 diabetes mellitus with hyperlipidemia (HCC) Continue blood glucose control with insulin sliding scale.  Holding on basal insulin due to risk of hypoglycemia.  Fasting glucose today 114 mg/dl.   Continue with atorvastatin.         Subjective: Patient is feeling better, dyspnea and edema have improved, and close to baseline. No chest pain   Physical Exam: Vitals:   02/09/23 0424 02/09/23 0728 02/09/23 0730 02/09/23 1104  BP: (!) 165/63 (!) 183/73  (!) 189/67  Pulse: 80 85  78  Resp: 16 18  19   Temp: 98 F (36.7 C) 98.2 F (36.8 C)  97.6 F (36.4 C)  TempSrc: Oral Oral Oral Oral  SpO2: 95%   97%  Weight: 99.6 kg  Height:       Neurology awake and alert ENT with mild pallor Cardiovascular with S1 and S2 present and regular, with no gallops or rubs, positive systolic murmur at the base  Respiratory with no rales or wheezing, no rhonchi Abdomen with no distention  Trace lower extremity edema  Data Reviewed:    Family Communication: no family at the bedside   Disposition: Status is:  Inpatient Remains inpatient appropriate because: possible discharge tomorrow with close follow up with nephrology as outpatient.   Planned Discharge Destination: Home     Author: Coralie Keens, MD 02/09/2023 1:09 PM  For on call review [email or web address].

## 2023-02-09 NOTE — Assessment & Plan Note (Signed)
Echocardiogram with preserved LV systolic function with EF 70 to 75%, mild LVH, RV systolic function preserved, RVSP 53,2 mmHg, LA and RA with moderate dilatation, trivial pericardial effusion, mild mitral valve stenosis, mild TR, moderate aortic stenosis.   Volume status has improved. Systolic blood pressure 166 to 151 mmHg.   Plan to continue diuresis with torsemide.  Continue carvedilol and isosorbide.  Blood pressure control with amlodipine.

## 2023-02-09 NOTE — Progress Notes (Signed)
Written info provided and reviewed re: CHF and fluid restriction with pt and SO.  All questions answered.  Both verbalized understanding and were able to teach back.

## 2023-02-10 ENCOUNTER — Encounter (HOSPITAL_COMMUNITY): Payer: Self-pay

## 2023-02-10 ENCOUNTER — Other Ambulatory Visit (HOSPITAL_COMMUNITY): Payer: Self-pay

## 2023-02-10 ENCOUNTER — Inpatient Hospital Stay (HOSPITAL_COMMUNITY)
Admit: 2023-02-10 | Discharge: 2023-02-10 | Disposition: A | Discharge status: Home / Self Care | Payer: Medicare HMO | Attending: Nephrology | Admitting: Nephrology

## 2023-02-10 DIAGNOSIS — N185 Chronic kidney disease, stage 5: Secondary | ICD-10-CM | POA: Diagnosis not present

## 2023-02-10 DIAGNOSIS — I10 Essential (primary) hypertension: Secondary | ICD-10-CM | POA: Diagnosis not present

## 2023-02-10 DIAGNOSIS — I2510 Atherosclerotic heart disease of native coronary artery without angina pectoris: Secondary | ICD-10-CM | POA: Diagnosis not present

## 2023-02-10 DIAGNOSIS — I5033 Acute on chronic diastolic (congestive) heart failure: Secondary | ICD-10-CM | POA: Diagnosis not present

## 2023-02-10 LAB — IRON AND TIBC
Iron: 37 ug/dL — ABNORMAL LOW (ref 45–182)
Saturation Ratios: 14 % — ABNORMAL LOW (ref 17.9–39.5)
TIBC: 260 ug/dL (ref 250–450)
UIBC: 223 ug/dL

## 2023-02-10 LAB — GLUCOSE, CAPILLARY
Glucose-Capillary: 98 mg/dL (ref 70–99)
Glucose-Capillary: 123 mg/dL — ABNORMAL HIGH (ref 70–99)

## 2023-02-10 LAB — RENAL FUNCTION PANEL
Albumin: 2.8 g/dL — ABNORMAL LOW (ref 3.5–5.0)
Anion gap: 10 (ref 5–15)
BUN: 75 mg/dL — ABNORMAL HIGH (ref 8–23)
CO2: 15 mmol/L — ABNORMAL LOW (ref 22–32)
Calcium: 8.7 mg/dL — ABNORMAL LOW (ref 8.9–10.3)
Chloride: 116 mmol/L — ABNORMAL HIGH (ref 98–111)
Creatinine, Ser: 6.58 mg/dL — ABNORMAL HIGH (ref 0.61–1.24)
GFR, Estimated: 8 mL/min — ABNORMAL LOW (ref >60)
Glucose, Bld: 106 mg/dL — ABNORMAL HIGH (ref 70–99)
Phosphorus: 4.5 mg/dL (ref 2.5–4.6)
Potassium: 3.9 mmol/L (ref 3.5–5.1)
Sodium: 141 mmol/L (ref 135–145)

## 2023-02-10 LAB — MAGNESIUM: Magnesium: 1.8 mg/dL (ref 1.7–2.4)

## 2023-02-10 LAB — FERRITIN: Ferritin: 118 ng/mL (ref 24–336)

## 2023-02-10 LAB — TRANSFERRIN: Transferrin: 181 mg/dL (ref 180–329)

## 2023-02-10 MED ORDER — NOVOLIN 70/30 (70-30) 100 UNIT/ML ~~LOC~~ SUSP: 8.0000 [IU] | Freq: Two times a day (BID) | SUBCUTANEOUS | Status: AC

## 2023-02-10 MED ORDER — SODIUM CHLORIDE 0.9 % IV SOLN
100.0000 mg | Freq: Once | INTRAVENOUS | Status: AC
  Administered 2023-02-10: 100 mg via INTRAVENOUS
  Filled 2023-02-10: qty 5

## 2023-02-10 MED ORDER — TORSEMIDE 20 MG PO TABS: 40.0000 mg | ORAL_TABLET | Freq: Two times a day (BID) | ORAL | Status: DC

## 2023-02-10 MED ORDER — TORSEMIDE 20 MG PO TABS
40.0000 mg | ORAL_TABLET | Freq: Two times a day (BID) | ORAL | 0 refills | Status: AC
  Filled 2023-02-10: qty 80, 20d supply, fill #0

## 2023-02-10 NOTE — Plan of Care (Signed)
  Problem: Education: Goal: Knowledge of General Education information will improve Description Including pain rating scale, medication(s)/side effects and non-pharmacologic comfort measures Outcome: Progressing   

## 2023-02-10 NOTE — Progress Notes (Signed)
DISCHARGE NOTE HOME [name] to be discharged Home per MD order. Discussed prescriptions and follow up appointments with the patient. Prescriptions given to patient; medication list explained in detail. Patient verbalized understanding.  Skin clean, dry and intact without evidence of skin break down, no evidence of skin tears noted. IV catheter discontinued intact. Site without signs and symptoms of complications. Dressing and pressure applied. Pt denies pain at the site currently. No complaints noted.  Patient free of lines, drains, and wounds.   An After Visit Summary (AVS) was printed and given to the patient. Patient escorted via wheelchair, and discharged home via private auto.  Lorine Bears, RN

## 2023-02-10 NOTE — TOC Transition Note (Addendum)
Transition of Care [facility]) - CM/SW Discharge Note   Patient Details  Name: [name] MRN: [id number] Date of Birth: [birth year]  Transition [facility]) CM/SW Contact:  Leone Haven, RN Phone Number: 02/10/2023, 12:42 PM   Clinical Narrative:    For dc today, wife will transport home. Has no needs. Per Staff RN, he has to get iron infusion before dc.   Final next level of care: Home/Self Care Barriers to Discharge: No Barriers Identified   Patient Goals and CMS Choice   Choice offered to / list presented to : NA  Discharge Placement                         Discharge Plan and Services Additional resources added to the After Visit Summary for   In-house Referral: NA Discharge Planning Services: CM Consult Post Acute Care Choice: NA          DME Arranged: [name] DME Agency: NA       HH Arranged: NA          Social Determinants of Health (SDOH) Interventions SDOH Screenings   Food Insecurity: No Food Insecurity (02/08/2023)  Housing: Low Risk  (02/08/2023)  Transportation Needs: No Transportation Needs (02/08/2023)  Utilities: Not At Risk (02/08/2023)  Alcohol Screen: Low Risk  (12/16/2019)  Depression (PHQ2-9): Low Risk  (06/30/2020)  Social Connections: Unknown (10/02/2022)   Received from [facility]  Tobacco Use: Medium Risk (02/08/2023)     Readmission Risk Interventions    02/10/2023   12:31 PM  Readmission Risk Prevention Plan  Transportation Screening Complete  PCP or Specialist Appt within 3-5 Days Complete  HRI or Home Care Consult Complete  Palliative Care Screening Not Applicable  Medication Review (RN Care Manager) Complete

## 2023-02-10 NOTE — Progress Notes (Signed)
Heart Failure Navigator Progress Note  Assessed for Heart & Vascular TOC clinic readiness.  Patient does not meet criteria due to Advanced Kidney Disease per Dr. Ella Jubilee.   Navigator will sign off at this time.  Roxy Horseman, RN, BSN [facility] Heart Failure Navigator Secure Chat Only

## 2023-02-10 NOTE — Discharge Summary (Signed)
Physician Discharge Summary   Patient: [name] MRN: [id number] DOB: 23-Sep-1933  Admit date:     02/08/2023  Discharge date: 02/10/23  Discharge Physician: [name]   PCP: Robert Hoit, MD   Recommendations at discharge:    Patient will continue loop diuretic therapy with torsemide 40 mg bid, with instructions to take extra dose in case of volume overload, weight gain 2 to 3 lbs in 24 hrs or 5 lb in 7 days.  Patient had 100 mg Iron sucrose IV prior to his discharge.  Follow up with Nephrology next week, renal panel and electrolytes.  Follow up with [name] in 7 to 10 days as outpatient.   Discharge Diagnoses: Principal Problem:   Stage 5 chronic kidney disease not on chronic dialysis [facility]) Active Problems:   Acute exacerbation of congestive heart failure (HCC)   CAD S/P percutaneous coronary angioplasty   Essential hypertension   Type 2 diabetes mellitus with hyperlipidemia (HCC)  Resolved Problems:   * No resolved hospital problems. [facility] Course: [name] was admitted to the hospital with the working diagnosis of volume overload in the setting of advanced chronic renal disease.   89 yo male with the past medical history of CKD stage V sp AV fistula creation 12/02/22, coronary artery disease, hyperlipidemia, heart failure, and hypertension who presented with dyspnea on exertion. Reported worsening symptoms over the last week, associated with lower extremity edema, orthopnea and lightheadedness. On his initial physical examination his blood pressure was 186/69, HR 83, RR 21 and 02 saturation 96%, lungs with no wheezing or rhonchi, heart with S1 and S3 present and regular, with no gallops, or murmurs, abdomen with no distention and positive lower extremity edema.   Na 140, K 4,3 Cl 114 bicarbonate 16, glucose 162, bun 74 cr 6,46  BNP 1,297  High sensitive troponin 24  Wbc 7.9 hgb 9,1 plt 225  Sars covid 19 negative   Chest radiograph with  cardiomegaly, bilateral hilar vascular congestion, bilateral central interstitial infiltrates, more dense at the left retrocardiac region, fluid in the right fissure and small bilateral pleural effusions.   EKG 79 bpm, normal axis, normal intervals, sinus rhythm with no significant ST segment changes, negative T wave lead III and AvF.   Patient was placed on IV furosemide with improvement in his volume status.  Renal function per serum cr has remained stable.   11/04 plan for discharge home today with torsemide in exchange to furosemide and instructions to take extra dose in case of volume overload. IV iron prior to discharge.  Follow up with Robert Malen Gauze from nephrology next week as scheduled.   Assessment and Plan: * Stage 5 chronic kidney disease not on chronic dialysis [name]) Patient was placed on furosemide for diuresis, negative fluid balance was achieved, patient lost about 2 Kg since admission with significant improvement in his symptoms.   Patient ill continue loop diuretic therapy at home with torsemide 40 mg pi bid with instructions to take one extra dose in case of signs of volume overload.  Follow up with Nephrology as outpatient.   Metabolic bone disease continue with calcitril, vitamin D3.  Oral bicarbonate for metabolic acidosis.   Patiromer M-W-F to prevent hyperkalemia.   Anemia of chronic renal disease with iron deficiency. Patient is getting IV iron as outpatient.  His serum iron today is 37, with TIBC 260, transferrin saturation 14 and ferritin 118. Will give on dose of Iron sucrose 100 mg prior to his  discharge.   Acute exacerbation of congestive heart failure (HCC) Echocardiogram with preserved LV systolic function with EF 70 to 75%, mild LVH, RV systolic function preserved, RVSP 53,2 mmHg, LA and RA with moderate dilatation, trivial pericardial effusion, mild mitral valve stenosis, mild TR, moderate aortic stenosis.   Volume status has improved. Systolic blood  pressure 166 to 151 mmHg.   Plan to continue diuresis with torsemide.  Continue carvedilol and isosorbide.  Blood pressure control with amlodipine.   CAD S/P percutaneous coronary angioplasty No chest pain, no acute coronary syndrome.   Essential hypertension Continue blood pressure control with carvedilol, amlodipine and isosorbide.  Diuresis with torsemide.   Type 2 diabetes mellitus with hyperlipidemia (HCC) Continue blood glucose control with insulin sliding scale.  Holding on basal insulin due to risk of hypoglycemia.  Fasting glucose today 75 mg/dl.   Patient will continue home insulin 70/30 at a reduced dose of 8 units bid with close follow up on capillary glucose.  Continue with atorvastatin.          Consultants: none  Procedures performed: none   Disposition: Home Diet recommendation:  Cardiac and Carb modified diet DISCHARGE MEDICATION: Allergies as of 02/10/2023   No Known Allergies      Medication List     STOP taking these medications    furosemide 40 MG tablet Commonly known as: LASIX   oxyCODONE-acetaminophen 5-325 MG tablet Commonly known as: Percocet       TAKE these medications    Accu-Chek Aviva Plus w/Device Kit Test blood sugar 3 times daily. Dx code: E11.22   accu-chek multiclix lancets Test blood sugar 3 times daily. Dx code: E11.22   acetaminophen 500 MG tablet Commonly known as: TYLENOL Take 1,000 mg by mouth every 8 (eight) hours as needed for moderate pain.   amLODipine 10 MG tablet Commonly known as: NORVASC TAKE 1 TABLET EVERY DAY (APPOINTMENT IS NEEDED FOR REFILLS) What changed: See the new instructions.   atorvastatin 10 MG tablet Commonly known as: LIPITOR TAKE 1 TABLET EVERY DAY What changed: when to take this   Blood Press Monitor/M-L Cuff Misc 1 application by Does not apply route daily.   calcitRIOL 0.25 MCG capsule Commonly known as: ROCALTROL Take 0.25 mcg by mouth daily.   carvedilol 25 MG  tablet Commonly known as: COREG TAKE 1 TABLET TWICE DAILY. KEEP OFFICE VISIT   Continuous Glucose Monitor Sup Kit 1 kit by Does not apply route as directed.   FISH OIL PO Take 1 capsule by mouth daily.   FreeStyle [employer] Misc 1 application by Does not apply route daily.   glucose blood test strip Commonly known as: Accu-Chek Aviva Plus Test blood sugar 3 times daily. Dx code: E11.22   Insulin Syringe-Needle U-100 31G X 5/16" 0.3 ML Misc Commonly known as: B-D INS SYR HALF-UNIT .3CC/31G Use daily at bedtime to inject insulin. Dx code: 250.00   isosorbide mononitrate 60 MG 24 hr tablet Commonly known as: IMDUR Take 2 tablets (120 mg total) by mouth daily. What changed:  how much to take when to take this   multivitamin tablet Take 1 tablet by mouth daily.   NovoLIN 70/30 (70-30) 100 UNIT/ML injection Generic drug: insulin NPH-regular Human Inject 8 Units into the skin 2 (two) times daily with a meal.   sodium bicarbonate 650 MG tablet Take 650 mg by mouth daily.   Systane 0.4-0.3 % Soln Generic drug: Polyethyl Glycol-Propyl Glycol Place 1 drop into both eyes as needed (  dry eyes).   tamsulosin 0.4 MG Caps capsule Commonly known as: FLOMAX Take 0.4 mg by mouth daily.   torsemide 20 MG tablet Commonly known as: DEMADEX Take 2 tablets (40 mg total) by mouth 2 (two) times daily. In case of weight gain 2 to 3 lbs in 24 hrs or 5 lbs in 7 days, take one extra tablet a day in the morning, until weight back to baseline.   Veltassa 8.4 g packet Generic drug: patiromer Take 8.4 g by mouth every Monday, Wednesday, and Friday.   VITAMIN D-3 PO Take 1 tablet by mouth daily.        Discharge Exam: Filed Weights   02/08/23 1542 02/09/23 0424 02/10/23 0337  Weight: 100.7 kg 99.6 kg 98.7 kg   BP (!) 166/74 (BP Location: Right Arm)   Pulse 72   Temp 97.8 F (36.6 C) (Oral)   Resp 18   Ht 6' (1.829 m)   Wt 98.7 kg   SpO2 96%   BMI 29.51 kg/m    Patient with improvement in dyspnea and edema, no chest pain, no PND, or orthopnea  Neurology awake and alert ENT with mild pallor Cardiovascular with S1 and S2 present and regular, positive systolic murmur at the base, with no rubs or gallops,  No JVD No lower extremity edema Respiratory with no rales or wheezing, no rhonchi Abdomen with no distention   Condition at discharge: stable  The results of significant diagnostics from this hospitalization (including imaging, microbiology, ancillary and laboratory) are listed below for reference.   Imaging Studies: ECHOCARDIOGRAM COMPLETE  Result Date: 02/09/2023    ECHOCARDIOGRAM REPORT   Patient Name:   [name] Date of Exam: 02/09/2023 Medical Rec #:  [id number]       Height:       72.0 in Accession #:    [id number]      Weight:       219.5 lb Date of Birth:  06/22/1933      BSA:          2.216 m Patient Age:    88 years        BP:           165/63 mmHg Patient Gender: M               HR:           74 bpm. Exam Location:  Inpatient Procedure: 2D Echo, Cardiac Doppler and Color Doppler Indications:    Dyspnea R06.00  History:        Patient has prior history of Echocardiogram examinations, most                 recent 08/07/2022. CHF, CAD, Arrythmias:PVC; Risk                 Factors:Hypertension, Diabetes and Dyslipidemia. CKD, stage 5.  Sonographer:    Lucendia Herrlich RCS Referring Phys: [phone] PROSPER M AMPONSAH IMPRESSIONS  1. Left ventricular ejection fraction, by estimation, is 70 to 75%. The left ventricle has hyperdynamic function. The left ventricle has no regional wall motion abnormalities. There is mild concentric left ventricular hypertrophy. Left ventricular diastolic parameters are consistent with Grade II diastolic dysfunction (pseudonormalization). Elevated left atrial pressure.  2. Right ventricular systolic function is normal. The right ventricular size is normal. There is moderately elevated pulmonary artery systolic pressure.   3. Left atrial size was moderately dilated.  4. Right atrial size was moderately dilated.  5. The mitral  valve is normal in structure. Mild mitral valve regurgitation. Mild mitral stenosis.  6. The aortic valve is tricuspid. Aortic valve regurgitation is trivial. Moderate aortic valve stenosis.  7. Aortic dilatation noted. There is borderline dilatation of the aortic root, measuring 38 mm.  8. The inferior vena cava is dilated in size with >50% respiratory variability, suggesting right atrial pressure of 8 mmHg. FINDINGS  Left Ventricle: Left ventricular ejection fraction, by estimation, is 70 to 75%. The left ventricle has hyperdynamic function. The left ventricle has no regional wall motion abnormalities. The left ventricular internal cavity size was normal in size. There is mild concentric left ventricular hypertrophy. Left ventricular diastolic parameters are consistent with Grade II diastolic dysfunction (pseudonormalization). Elevated left atrial pressure. Right Ventricle: The right ventricular size is normal. Right ventricular systolic function is normal. There is moderately elevated pulmonary artery systolic pressure. The tricuspid regurgitant velocity is 3.36 m/s, and with an assumed right atrial pressure of 8 mmHg, the estimated right ventricular systolic pressure is 53.2 mmHg. Left Atrium: Left atrial size was moderately dilated. Right Atrium: Right atrial size was moderately dilated. Pericardium: Trivial pericardial effusion is present. Mitral Valve: The mitral valve is normal in structure. Mild mitral annular calcification. Mild mitral valve regurgitation. Mild mitral valve stenosis. MV peak gradient, 11.2 mmHg. The mean mitral valve gradient is 4.3 mmHg. Tricuspid Valve: The tricuspid valve is normal in structure. Tricuspid valve regurgitation is mild . No evidence of tricuspid stenosis. Aortic Valve: The aortic valve is tricuspid. Aortic valve regurgitation is trivial. Aortic regurgitation PHT  measures 464 msec. Moderate aortic stenosis is present. Aortic valve mean gradient measures 24.0 mmHg. Aortic valve peak gradient measures 43.3 mmHg. Aortic valve area, by VTI measures 1.09 cm. Pulmonic Valve: The pulmonic valve was normal in structure. Pulmonic valve regurgitation is not visualized. No evidence of pulmonic stenosis. Aorta: Aortic dilatation noted. There is borderline dilatation of the aortic root, measuring 38 mm. Venous: The inferior vena cava is dilated in size with greater than 50% respiratory variability, suggesting right atrial pressure of 8 mmHg. IAS/Shunts: No atrial level shunt detected by color flow Doppler.  LEFT VENTRICLE PLAX 2D LVIDd:         4.90 cm   Diastology LVIDs:         3.10 cm   LV e' medial:    7.98 cm/s LV PW:         1.30 cm   LV E/e' medial:  17.4 LV IVS:        1.20 cm   LV e' lateral:   5.26 cm/s LVOT diam:     2.10 cm   LV E/e' lateral: 26.4 LV SV:         88 LV SV Index:   40 LVOT Area:     3.46 cm  RIGHT VENTRICLE             IVC RV S prime:     17.00 cm/s  IVC diam: 2.50 cm TAPSE (M-mode): 2.5 cm LEFT ATRIUM             Index        RIGHT ATRIUM           Index LA diam:        4.00 cm 1.80 cm/m   RA Area:     27.60 cm LA Vol (A2C):   78.3 ml 35.33 ml/m  RA Volume:   92.40 ml  41.69 ml/m LA Vol (A4C):   83.4 ml  37.63 ml/m LA Biplane Vol: 89.5 ml 40.38 ml/m  AORTIC VALVE AV Area (Vmax):    1.10 cm AV Area (Vmean):   1.03 cm AV Area (VTI):     1.09 cm AV Vmax:           329.00 cm/s AV Vmean:          229.000 cm/s AV VTI:            0.804 m AV Peak Grad:      43.3 mmHg AV Mean Grad:      24.0 mmHg LVOT Vmax:         104.72 cm/s LVOT Vmean:        68.425 cm/s LVOT VTI:          0.253 m LVOT/AV VTI ratio: 0.32 AI PHT:            464 msec  AORTA Ao Root diam: 3.80 cm Ao Asc diam:  3.70 cm MITRAL VALVE                TRICUSPID VALVE MV Area (PHT): 3.03 cm     TR Peak grad:   45.2 mmHg MV Area VTI:   1.64 cm     TR Vmax:        336.00 cm/s MV Peak grad:  11.2  mmHg MV Mean grad:  4.3 mmHg     SHUNTS MV Vmax:       1.67 m/s     Systemic VTI:  0.25 m MV Vmean:      96.2 cm/s    Systemic Diam: 2.10 cm MV Decel Time: 250 msec MR Peak grad: 26.8 mmHg MR Vmax:      259.00 cm/s MV E velocity: 139.00 cm/s MV A velocity: 120.00 cm/s MV E/A ratio:  1.16 Olga Millers MD Electronically signed by Olga Millers MD Signature Date/Time: 02/09/2023/11:13:32 AM    Final    DG Chest 2 View  Result Date: 02/08/2023 CLINICAL DATA:  Worsening chest pain and shortness of breath for 1 week. EXAM: CHEST - 2 VIEW COMPARISON:  02/07/2023 and 11/07/2020 FINDINGS: The heart size and mediastinal contours are within normal limits. Bibasilar scarring and bilateral central peribronchial thickening again noted. Mild airspace opacity is seen in the posterior left lower lobe, suspicious for pneumonia. IMPRESSION: Mild posterior left lower lobe airspace opacity, suspicious for pneumonia. Bibasilar scarring and central peribronchial thickening. Electronically Signed   By: Danae Orleans M.D.   On: 02/08/2023 09:55   DG Chest 2 View  Result Date: 02/07/2023 CLINICAL DATA:  Shortness of breath and chest pain. EXAM: CHEST - 2 VIEW COMPARISON:  Radiographs 12/20/2020 and 11/07/2020.  CT 03/18/2016. FINDINGS: The heart size and mediastinal contours are stable with aortic atherosclerosis. Chronic pleural thickening at the left lung base with chronic left basilar scarring. Possible new small right pleural effusion with new patchy right basilar airspace disease and diffuse central airway thickening. No evidence of pneumothorax or confluent airspace disease. There are stable degenerative changes in the spine. IMPRESSION: 1. New diffuse central airway thickening with possible small right pleural effusion and patchy right basilar airspace disease. Findings may be secondary to superimposed bronchitis or edema. No confluent airspace disease. 2. Chronic left basilar scarring and pleural thickening. Electronically  Signed   By: Carey Bullocks M.D.   On: 02/07/2023 18:03    Microbiology: Results for orders placed or performed during the hospital encounter of 02/08/23  SARS Coronavirus 2 by RT PCR (hospital order, performed in  [facility] hospital lab) *cepheid single result test* Anterior Nasal Swab     Status: None   Collection Time: 02/08/23  9:55 AM   Specimen: Anterior Nasal Swab  Result Value Ref Range Status   SARS Coronavirus 2 by RT PCR NEGATIVE NEGATIVE Final    Comment: (NOTE) SARS-CoV-2 target nucleic acids are NOT DETECTED.  The SARS-CoV-2 RNA is generally detectable in upper and lower respiratory specimens during the acute phase of infection. The lowest concentration of SARS-CoV-2 viral copies this assay can detect is 250 copies / mL. A negative result does not preclude SARS-CoV-2 infection and should not be used as the sole basis for treatment or other patient management decisions.  A negative result may occur with improper specimen collection / handling, submission of specimen other than nasopharyngeal swab, presence of viral mutation(s) within the areas targeted by this assay, and inadequate number of viral copies (<250 copies / mL). A negative result must be combined with clinical observations, patient history, and epidemiological information.  Fact Sheet for Patients:   RoadLapTop.co.za  Fact Sheet for Healthcare Providers: http://kim-miller.com/  This test is not yet approved or  cleared by the [place] FDA and has been authorized for detection and/or diagnosis of SARS-CoV-2 by FDA under an Emergency Use Authorization (EUA).  This EUA will remain in effect (meaning this test can be used) for the duration of the COVID-19 declaration under Section 564(b)(1) of the Act, 21 U.S.C. section 360bbb-3(b)(1), unless the authorization is terminated or revoked sooner.  Performed at [facility], [address]  [place], [place] [address]     Labs: CBC: Recent Labs  Lab 02/07/23 1458 02/08/23 0955 02/09/23 0239  WBC 8.0 7.9 7.8  HGB 9.4* 9.1* 9.2*  HCT 30.7* 28.9* 30.0*  MCV 86.5 84.3 83.6  PLT 229 225 227   Basic Metabolic Panel: Recent Labs  Lab 02/07/23 1458 02/08/23 0955 02/09/23 0239 02/10/23 0249  NA 139 140 142 141  K 4.4 4.3 4.1 3.9  CL 110 114* 117* 116*  CO2 16* 16* 15* 15*  GLUCOSE 123* 162* 114* 106*  BUN 77* 74* 69* 75*  CREATININE 6.54* 6.46* 6.33* 6.58*  CALCIUM 9.1 8.4* 8.6* 8.7*  MG  --   --  1.9 1.8  PHOS  --   --  4.3 4.5   Liver Function Tests: Recent Labs  Lab 02/08/23 0955 02/09/23 0239 02/10/23 0249  AST 13*  --   --   ALT 11  --   --   ALKPHOS 39  --   --   BILITOT 0.6  --   --   PROT 6.3*  --   --   ALBUMIN 3.0* 2.9* 2.8*   CBG: Recent Labs  Lab 02/09/23 0611 02/09/23 1100 02/09/23 1609 02/10/23 0625 02/10/23 1107  GLUCAP 102* 122* 144* 98 123*    Discharge time spent: greater than 30 minutes.  Signed: Coralie Keens, MD [facility] 02/10/2023

## 2023-02-10 NOTE — TOC Initial Note (Signed)
Transition of Care [facility]) - Initial/Assessment Note    Patient Details  Name: [name] MRN: [id number] Date of Birth: February 12, 1934  Transition of Care [facility]) CM/SW Contact:    Leone Haven, RN Phone Number: 02/10/2023, 12:40 PM  Clinical Narrative:                 From home with spouse, has PCP and insurance on file, states has no HH services in place at this time or DME at home.  States wife will transport him  home at [employer] and family is support system, states gets medications from [place] on [employer] and [employer] order.    Pta self ambulatory.  He has a scale at home and will start weighing daily, he also has a bp cuff. And eats a no sodium diet.    Expected Discharge Plan: Home/Self Care Barriers to Discharge: No Barriers Identified   Patient Goals and CMS Choice Patient states their goals for this hospitalization and ongoing recovery are:: return home   Choice offered to / list presented to : NA      Expected Discharge Plan and Services In-house Referral: NA Discharge Planning Services: CM Consult Post Acute Care Choice: NA Living arrangements for the past 2 months: Single Family Home Expected Discharge Date: 02/10/23               DME Arranged: [name] DME Agency: NA       HH Arranged: NA          Prior Living Arrangements/Services Living arrangements for the past 2 months: Single Family Home Lives with:: Spouse Patient language and need for interpreter reviewed:: Yes Do you feel safe going back to the place where you live?: Yes      Need for Family Participation in Patient Care: Yes (Comment) Care giver support system in place?: Yes (comment) Current home services: DME Criminal Activity/Legal Involvement Pertinent to Current Situation/Hospitalization: No - Comment as needed  Activities of Daily Living   ADL Screening (condition at time of admission) Independently performs ADLs?: Yes (appropriate for developmental age) Is the patient deaf or  have difficulty hearing?: No Does the patient have difficulty seeing, even when wearing glasses/contacts?: No Does the patient have difficulty concentrating, remembering, or making decisions?: No  Permission Sought/Granted Permission sought to share information with : Case Manager Permission granted to share information with : Yes, Verbal Permission Granted              Emotional Assessment Appearance:: Appears stated age Attitude/Demeanor/Rapport: Engaged Affect (typically observed): Appropriate Orientation: : Oriented to Self, Oriented to  Time, Oriented to Situation Alcohol / Substance Use: Not Applicable Psych Involvement: No (comment)  Admission diagnosis:  Pulmonary infiltrate [R91.8] SOB (shortness of breath) [R06.02] Acute exacerbation of congestive heart failure (HCC) [I50.9] Elevated brain natriuretic peptide (BNP) level [R79.89] Essential hypertension [I10] Acute on chronic combined systolic and diastolic CHF (congestive heart failure) (HCC) [I50.43] Stage 5 chronic kidney disease not on chronic dialysis [facility]) [N18.5] Patient Active Problem List   Diagnosis Date Noted   Acute exacerbation of congestive heart failure (HCC) 02/08/2023   Stage 5 chronic kidney disease not on chronic dialysis (HCC) 11/22/2022   Aortic valve disease 02/07/2021   CHF (congestive heart failure) (HCC) 09/07/2019   Acute renal failure superimposed on stage 3b chronic kidney disease (HCC) 09/07/2019   Lower extremity edema 08/18/2019   Osteoarthritis of right knee 03/04/2018   Rib pain on left side 07/01/2016   Fall 12/14/2015  Elevated serum creatinine 12/14/2015   Ileus (HCC) 12/14/2015   Multiple fractures of ribs of left side 12/12/2015   Prostate cancer (HCC) 05/26/2014   Hematuria 12/01/2013   Cystitis, radiation 12/01/2013   Hematuria 12/01/2013   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   Erectile dysfunction 11/26/2013   Pulsatile tinnitus 11/19/2013   Rectal bleeding 12/23/2012    Type 2 diabetes mellitus with hyperlipidemia [facility])    Essential hypertension    Chronic back pain    ED (erectile dysfunction)    Anemia    Dyslipidemia, goal LDL below 70    CAD S/P percutaneous coronary angioplasty 05/08/2008   History of ventricular tachycardia 05/08/2008   PREMATURE VENTRICULAR CONTRACTIONS 05/08/2008   PCP:  Bernadette Hoit, MD Pharmacy:   [facility] [phone] [place], [place] - [address] AT [facility] [address] [place] [address] Phone: [phone] Fax: [phone]  [facility] - [address], [place] - [address] [place] [address] Phone: [phone] Fax: [phone]  [facility] [address] [place] [address] Phone: [phone] Fax: [phone]     Social Determinants of Health (SDOH) Social History: SDOH Screenings   Food Insecurity: No Food Insecurity (02/08/2023)  Housing: Low Risk  (02/08/2023)  Transportation Needs: No Transportation Needs (02/08/2023)  Utilities: Not At Risk (02/08/2023)  Alcohol Screen: Low Risk  (12/16/2019)  Depression (PHQ2-9): Low Risk  (06/30/2020)  Social Connections: Unknown (10/02/2022)   [facility]  Tobacco Use: Medium Risk (02/08/2023)   SDOH Interventions:     Readmission Risk Interventions    02/10/2023   12:31 PM  Readmission Risk Prevention Plan  Transportation Screening Complete  PCP or Specialist Appt within 3-5 Days Complete  HRI or Home Care Consult Complete  Palliative Care Screening Not Applicable  Medication Review (RN Care Manager) Complete

## 2023-02-10 NOTE — Plan of Care (Signed)
  Problem: Education: Goal: Knowledge of General Education information will improve Description: Including pain rating scale, medication(s)/side effects and non-pharmacologic comfort measures Outcome: Progressing   Problem: Health Behavior/Discharge Planning: Goal: Ability to manage health-related needs will improve Outcome: Completed/Met   Problem: Clinical Measurements: Goal: Ability to maintain clinical measurements within normal limits will improve Outcome: Progressing Goal: Will remain free from infection Outcome: Progressing Goal: Diagnostic test results will improve Outcome: Progressing Goal: Respiratory complications will improve Outcome: Progressing Goal: Cardiovascular complication will be avoided Outcome: Progressing

## 2023-02-12 ENCOUNTER — Other Ambulatory Visit: Payer: Self-pay | Admitting: Cardiovascular Disease

## 2023-02-17 ENCOUNTER — Other Ambulatory Visit: Payer: Self-pay

## 2023-02-17 DIAGNOSIS — N185 Chronic kidney disease, stage 5: Secondary | ICD-10-CM

## 2023-02-20 ENCOUNTER — Telehealth: Payer: Self-pay | Admitting: Cardiovascular Disease

## 2023-02-20 NOTE — Telephone Encounter (Signed)
Pharmacy is on the line wanting clarification on a medication for patient

## 2023-02-20 NOTE — Telephone Encounter (Addendum)
Per chart review pt was recently discharged from the hospital. Discharge orders state that pt is to discontinue taking furosemide and start toresemide 40mg  twice daily. Unsure of how recent refill request was generated, automatically or initiated by the pt. Called [facility] and communicated this with them. New orders given to pharmacist.   Called pt to double check that he is taking torsemide and is not taking furosemide an instructed at discharge. Pt is clear on his instructions and is only taking torsemide.

## 2023-02-22 ENCOUNTER — Other Ambulatory Visit: Payer: Self-pay | Admitting: Cardiovascular Disease

## 2023-02-22 DIAGNOSIS — E785 Hyperlipidemia, unspecified: Secondary | ICD-10-CM

## 2023-02-24 ENCOUNTER — Encounter (HOSPITAL_COMMUNITY): Payer: Medicare HMO

## 2023-02-25 ENCOUNTER — Other Ambulatory Visit (HOSPITAL_COMMUNITY): Payer: Self-pay

## 2023-03-04 ENCOUNTER — Ambulatory Visit (HOSPITAL_COMMUNITY)
Admission: RE | Admit: 2023-03-04 | Discharge: 2023-03-04 | Disposition: A | Discharge status: Home / Self Care | Payer: Medicare HMO | Source: Ambulatory Visit | Attending: Physician Assistant | Admitting: Physician Assistant

## 2023-03-04 DIAGNOSIS — N185 Chronic kidney disease, stage 5: Secondary | ICD-10-CM | POA: Diagnosis present

## 2023-03-11 ENCOUNTER — Ambulatory Visit (INDEPENDENT_AMBULATORY_CARE_PROVIDER_SITE_OTHER): Payer: Medicare HMO | Admitting: Physician Assistant

## 2023-03-11 VITALS — BP 117/62 | HR 64 | Temp 98.0°F | Resp 18 | Ht 72.0 in | Wt 210.4 lb

## 2023-03-11 DIAGNOSIS — N185 Chronic kidney disease, stage 5: Secondary | ICD-10-CM

## 2023-03-11 DIAGNOSIS — L7634 Postprocedural seroma of skin and subcutaneous tissue following other procedure: Secondary | ICD-10-CM

## 2023-03-11 NOTE — Progress Notes (Signed)
POST OPERATIVE OFFICE NOTE    CC:  F/u for surgery  HPI:  [name] is a 89 y.o. male who is s/p left brachiobasilic fistula creation on 01/22/2023 by [facility]. He previously had a left brachiocephalic fistula that occluded shortly after its creation.   He returns today for follow up. He denies any symptoms of steal in the left hand such as weakness, numbness, coldness, or pain.  He denies any erythema or dehiscence of his incision.  He states since surgery he did develop a small area of swelling around his incision.  This is stable in size.  He denies any tenderness to this area.  He denies any fevers.  He is still not on dialysis and has no set start date.  He is going to meet with his nephrologist on Christmas Eve.   No Known Allergies  Current Outpatient Medications  Medication Sig Dispense Refill   acetaminophen (TYLENOL) 500 MG tablet Take 1,000 mg by mouth every 8 (eight) hours as needed for moderate pain.     amLODipine (NORVASC) 10 MG tablet TAKE 1 TABLET EVERY DAY (APPOINTMENT IS NEEDED FOR REFILLS) (Patient taking differently: Take 10 mg by mouth daily.) 90 tablet 3   atorvastatin (LIPITOR) 10 MG tablet TAKE 1 TABLET EVERY DAY (Patient taking differently: Take 10 mg by mouth at bedtime.) 90 tablet 3   Blood Glucose Monitoring Suppl (ACCU-CHEK AVIVA PLUS) w/Device KIT Test blood sugar 3 times daily. Dx code: E11.22 1 kit 0   Blood Pressure Monitoring (BLOOD PRESS MONITOR/M-L CUFF) MISC 1 application by Does not apply route daily. 1 each 0   calcitRIOL (ROCALTROL) 0.25 MCG capsule Take 0.25 mcg by mouth daily.     carvedilol (COREG) 25 MG tablet TAKE 1 TABLET TWICE DAILY. KEEP OFFICE VISIT 180 tablet 3   Cholecalciferol (VITAMIN D-3 PO) Take 1 tablet by mouth daily.     Continuous Blood Gluc Sensor (FREESTYLE LIBRE SENSOR SYSTEM) MISC 1 application by Does not apply route daily. 1 each 2   Continuous Glucose Monitor Sup KIT 1 kit by Does not apply route as directed. 1 kit 0    furosemide (LASIX) 40 MG tablet Take 20 mg by mouth as needed for fluid. Pt may take an additional 20mg  if no relief with first dose.     glucose blood (ACCU-CHEK AVIVA PLUS) test strip Test blood sugar 3 times daily. Dx code: E11.22 300 each 3   insulin NPH-regular Human (NOVOLIN 70/30) (70-30) 100 UNIT/ML injection Inject 8 Units into the skin 2 (two) times daily with a meal.     Insulin Syringe-Needle U-100 (B-D INS SYR HALF-UNIT .3CC/31G) 31G X 5/16" 0.3 ML MISC Use daily at bedtime to inject insulin. Dx code: 250.00 100 each 3   isosorbide mononitrate (IMDUR) 60 MG 24 hr tablet Take 2 tablets (120 mg total) by mouth daily. (Patient taking differently: Take 60 mg by mouth in the morning and at bedtime.) 180 tablet 2   Lancets (ACCU-CHEK MULTICLIX) lancets Test blood sugar 3 times daily. Dx code: E11.22 300 each 3   Multiple Vitamin (MULTIVITAMIN) tablet Take 1 tablet by mouth daily.     Omega-3 Fatty Acids (FISH OIL PO) Take 1 capsule by mouth daily.     Polyethyl Glycol-Propyl Glycol (SYSTANE) 0.4-0.3 % SOLN Place 1 drop into both eyes as needed (dry eyes).     sodium bicarbonate 650 MG tablet Take 650 mg by mouth daily.     tamsulosin (FLOMAX) 0.4 MG CAPS capsule Take  0.4 mg by mouth daily.     torsemide (DEMADEX) 20 MG tablet Take 2 tablets (40 mg total) by mouth 2 (two) times daily. In case of weight gain 2 to 3 lbs in 24 hrs or 5 lbs in 7 days, take one extra tablet a day in the morning, until weight back to baseline. 80 tablet 0   VELTASSA 8.4 g packet Take 8.4 g by mouth every Monday, Wednesday, and Friday.     No current facility-administered medications for this visit.     ROS:  See HPI  Physical Exam:  Incision: Left AC fossa incision well-healed without erythema or dehiscence.  Small seroma near incision about 1 inch in diameter.  No tenderness or overlying erythema Extremities: Palpable left radial pulse.  Left brachiobasilic fistula with great thrill and palpation throughout  upper arm Neuro: Intact motor and sensation of left upper extremity    Dialysis Duplex (03/04/2023) +--------------------+----------+-----------------+--------+  AVF                PSV (cm/s)Flow Vol (mL/min)Comments  +--------------------+----------+-----------------+--------+  Native artery inflow   157           588                 +--------------------+----------+-----------------+--------+  AVF Anastomosis        386                               +--------------------+----------+-----------------+--------+     +------------+----------+-------------+----------+----------------+  OUTFLOW VEINPSV (cm/s)Diameter (cm)Depth (cm)    Describe      +------------+----------+-------------+----------+----------------+  Prox UA        142                                             +------------+----------+-------------+----------+----------------+  Mid UA         201        0.62        1.16                     +------------+----------+-------------+----------+----------------+  Dist UA        260        0.54        1.21                     +------------+----------+-------------+----------+----------------+  AC Fossa       419        0.46        1.32   competing branch  +------------+----------+-------------+----------+----------------+     Assessment/Plan:  This is a 89 y.o. male who is here for postop check  -The patient recently underwent left brachiobasilic fistula creation for future dialysis access.  He is currently not on dialysis -Upon exam today, his left upper extremity incision is well-healed without signs of infection.  He has developed a small seroma around his incision site.  This does not appear infected.  He denies any tenderness to this area.  He states the seroma has been stable in size since surgery. -He has a palpable left radial pulse.  He denies any symptoms of steal -His left brachiobasilic fistula has a great thrill and  palpation.  Duplex demonstrates a well maturing fistula with some competing branches.  Current flow volume is 588 mL/min -At this  time since the patient is not on dialysis yet I would like his seroma to heal prior to second stage surgery.  I have placed an Ace wrap on his arm with moderate compression.  I have encouraged him to elevate his arm and wear this Ace wrap daily in hopes to get rid of this seroma. -He can return to clinic in 2 weeks for repeat wound check.  If his seroma is improved/gone at his next visit, we can schedule his second stage surgery   Loel Dubonnet, PA-C [facility] [phone]   Clinic MD:  Lenell Antu

## 2023-03-24 NOTE — Progress Notes (Signed)
POST OPERATIVE OFFICE NOTE    CC:  F/u for surgery  HPI:  This is a 89 y.o. male who is s/p left BC AVF on 12/02/2022 by Dr. Chestine Spore.  This subsequently occluded and he underwent left 1st stage BVT on 01/22/2023 by Dr. Chestine Spore.   He was last seen on 03/11/2023 and at that time, he was not having any steal sx.  He did have a small area of swelling around the incision that was stable and non tender.  He was not having any fevers.    His fistula was maturing nicely and had an excellent thrill.  An ace wrap was placed with moderate compression and he was encouraged to elevate his arm.  He was scheduled for 2 week follow up.   He was not yet on HD and has appt with nephrologist on 04/01/2023.  Pt is here with his wife of 10 years.  Pt states he does not have pain/numbness in the left hand.  He is not yet on dialysis.   He states the wrap that was placed at last visit was itchy and he has taken it off.  He feels the swelling is slightly improved.       No Known Allergies  Current Outpatient Medications  Medication Sig Dispense Refill   acetaminophen (TYLENOL) 500 MG tablet Take 1,000 mg by mouth every 8 (eight) hours as needed for moderate pain.     amLODipine (NORVASC) 10 MG tablet TAKE 1 TABLET EVERY DAY (APPOINTMENT IS NEEDED FOR REFILLS) (Patient taking differently: Take 10 mg by mouth daily.) 90 tablet 3   atorvastatin (LIPITOR) 10 MG tablet TAKE 1 TABLET EVERY DAY (Patient taking differently: Take 10 mg by mouth at bedtime.) 90 tablet 3   Blood Glucose Monitoring Suppl (ACCU-CHEK AVIVA PLUS) w/Device KIT Test blood sugar 3 times daily. Dx code: E11.22 1 kit 0   Blood Pressure Monitoring (BLOOD PRESS MONITOR/M-L CUFF) MISC 1 application by Does not apply route daily. 1 each 0   calcitRIOL (ROCALTROL) 0.25 MCG capsule Take 0.25 mcg by mouth daily.     carvedilol (COREG) 25 MG tablet TAKE 1 TABLET TWICE DAILY. KEEP OFFICE VISIT 180 tablet 3   Cholecalciferol (VITAMIN D-3 PO) Take 1 tablet by  mouth daily.     Continuous Blood Gluc Sensor (FREESTYLE LIBRE SENSOR SYSTEM) MISC 1 application by Does not apply route daily. 1 each 2   Continuous Glucose Monitor Sup KIT 1 kit by Does not apply route as directed. 1 kit 0   furosemide (LASIX) 40 MG tablet Take 20 mg by mouth as needed for fluid. Pt may take an additional 20mg  if no relief with first dose.     glucose blood (ACCU-CHEK AVIVA PLUS) test strip Test blood sugar 3 times daily. Dx code: E11.22 300 each 3   insulin NPH-regular Human (NOVOLIN 70/30) (70-30) 100 UNIT/ML injection Inject 8 Units into the skin 2 (two) times daily with a meal.     Insulin Syringe-Needle U-100 (B-D INS SYR HALF-UNIT .3CC/31G) 31G X 5/16" 0.3 ML MISC Use daily at bedtime to inject insulin. Dx code: 250.00 100 each 3   isosorbide mononitrate (IMDUR) 60 MG 24 hr tablet Take 2 tablets (120 mg total) by mouth daily. (Patient taking differently: Take 60 mg by mouth in the morning and at bedtime.) 180 tablet 2   Lancets (ACCU-CHEK MULTICLIX) lancets Test blood sugar 3 times daily. Dx code: E11.22 300 each 3   Multiple Vitamin (MULTIVITAMIN) tablet Take 1 tablet  by mouth daily.     Omega-3 Fatty Acids (FISH OIL PO) Take 1 capsule by mouth daily.     Polyethyl Glycol-Propyl Glycol (SYSTANE) 0.4-0.3 % SOLN Place 1 drop into both eyes as needed (dry eyes).     sodium bicarbonate 650 MG tablet Take 650 mg by mouth daily.     tamsulosin (FLOMAX) 0.4 MG CAPS capsule Take 0.4 mg by mouth daily.     torsemide (DEMADEX) 20 MG tablet Take 2 tablets (40 mg total) by mouth 2 (two) times daily. In case of weight gain 2 to 3 lbs in 24 hrs or 5 lbs in 7 days, take one extra tablet a day in the morning, until weight back to baseline. 80 tablet 0   VELTASSA 8.4 g packet Take 8.4 g by mouth every Monday, Wednesday, and Friday.     No current facility-administered medications for this visit.     ROS:  See HPI  Physical Exam:   Incision:  well healed.  Seroma/hematoma present and  not much different from what is pictured at last visit.  Extremities:   There is an easily palpable left radial pulse pulse.   Motor and sensory are in tact.   There is a thrill/bruit present.    Dialysis Duplex on 03/04/2023: Findings:  +--------------------+----------+-----------------+--------+  AVF                PSV (cm/s)Flow Vol (mL/min)Comments  +--------------------+----------+-----------------+--------+  Native artery inflow   157           588                 +--------------------+----------+-----------------+--------+  AVF Anastomosis        386                               +--------------------+----------+-----------------+--------+    +------------+----------+-------------+----------+----------------+  OUTFLOW VEINPSV (cm/s)Diameter (cm)Depth (cm)    Describe      +------------+----------+-------------+----------+----------------+  Prox UA        142                                             +------------+----------+-------------+----------+----------------+  Mid UA         201        0.62        1.16                     +------------+----------+-------------+----------+----------------+  Dist UA        260        0.54        1.21                     +------------+----------+-------------+----------+----------------+  AC Fossa       419        0.46        1.32   competing branch  +------------+----------+-------------+----------+----------------+   Summary:  Patent arteriovenous fistula.   Seroma at the antecubital fossa measuring approximately 2.15 cm x 3.32 cm.    Assessment/Plan:  This is a 89 y.o. male who is s/p:  left BC AVF on 12/02/2022 by Dr. Chestine Spore.  This subsequently occluded and he underwent left 1st stage BVT on 01/22/2023 by Dr. Chestine Spore.   -the pt does not have evidence of steal. -he  is not yet on HD. -his fistula is maturing.  I discussed with Dr. Chestine Spore and ok to proceed with 2nd stage BVT.  He will wash  out hematoma/seroma at time of surgery.  Pt would like to wait until after the holidays, which is fine.   -he is not on [facility] -discussed with pt and wife that access does not last forever and may need additional procedures or even new access in the future.  They both expressed understanding.    Doreatha Massed, [facility] [phone]  Clinic MD:  Chestine Spore

## 2023-03-25 ENCOUNTER — Ambulatory Visit (INDEPENDENT_AMBULATORY_CARE_PROVIDER_SITE_OTHER): Payer: Medicare HMO | Admitting: Physician Assistant

## 2023-03-25 ENCOUNTER — Encounter: Payer: Self-pay | Admitting: Physician Assistant

## 2023-03-25 DIAGNOSIS — N185 Chronic kidney disease, stage 5: Secondary | ICD-10-CM

## 2023-03-28 ENCOUNTER — Other Ambulatory Visit: Payer: Self-pay

## 2023-03-28 DIAGNOSIS — N185 Chronic kidney disease, stage 5: Secondary | ICD-10-CM

## 2023-04-10 ENCOUNTER — Encounter (HOSPITAL_COMMUNITY)
Admission: RE | Disposition: A | Discharge status: Home / Self Care | Payer: Self-pay | Source: Home / Self Care | Attending: Nephrology

## 2023-04-10 ENCOUNTER — Other Ambulatory Visit: Payer: Self-pay

## 2023-04-10 ENCOUNTER — Ambulatory Visit (HOSPITAL_COMMUNITY)
Admission: RE | Admit: 2023-04-10 | Discharge: 2023-04-10 | Disposition: A | Discharge status: Home / Self Care | Payer: Medicare HMO | Attending: Nephrology | Admitting: Nephrology

## 2023-04-10 DIAGNOSIS — Z794 Long term (current) use of insulin: Secondary | ICD-10-CM | POA: Insufficient documentation

## 2023-04-10 DIAGNOSIS — Z992 Dependence on renal dialysis: Secondary | ICD-10-CM | POA: Diagnosis not present

## 2023-04-10 DIAGNOSIS — I132 Hypertensive heart and chronic kidney disease with heart failure and with stage 5 chronic kidney disease, or end stage renal disease: Secondary | ICD-10-CM | POA: Insufficient documentation

## 2023-04-10 DIAGNOSIS — G8929 Other chronic pain: Secondary | ICD-10-CM | POA: Insufficient documentation

## 2023-04-10 DIAGNOSIS — I5032 Chronic diastolic (congestive) heart failure: Secondary | ICD-10-CM | POA: Diagnosis not present

## 2023-04-10 DIAGNOSIS — D631 Anemia in chronic kidney disease: Secondary | ICD-10-CM | POA: Insufficient documentation

## 2023-04-10 DIAGNOSIS — Z87891 Personal history of nicotine dependence: Secondary | ICD-10-CM | POA: Insufficient documentation

## 2023-04-10 DIAGNOSIS — N186 End stage renal disease: Secondary | ICD-10-CM | POA: Diagnosis not present

## 2023-04-10 DIAGNOSIS — I2510 Atherosclerotic heart disease of native coronary artery without angina pectoris: Secondary | ICD-10-CM | POA: Diagnosis not present

## 2023-04-10 DIAGNOSIS — E1122 Type 2 diabetes mellitus with diabetic chronic kidney disease: Secondary | ICD-10-CM | POA: Diagnosis not present

## 2023-04-10 HISTORY — PX: DIALYSIS/PERMA CATHETER INSERTION: CATH118288

## 2023-04-10 LAB — POCT I-STAT, CHEM 8
BUN: 101 mg/dL — ABNORMAL HIGH (ref 8–23)
Calcium, Ion: 1.25 mmol/L (ref 1.15–1.40)
Chloride: 116 mmol/L — ABNORMAL HIGH (ref 98–111)
Creatinine, Ser: 8.3 mg/dL — ABNORMAL HIGH (ref 0.61–1.24)
Glucose, Bld: 93 mg/dL (ref 70–99)
HCT: 32 % — ABNORMAL LOW (ref 39.0–52.0)
Hemoglobin: 10.9 g/dL — ABNORMAL LOW (ref 13.0–17.0)
Potassium: 4.6 mmol/L (ref 3.5–5.1)
Sodium: 145 mmol/L (ref 135–145)
TCO2: 16 mmol/L — ABNORMAL LOW (ref 22–32)

## 2023-04-10 LAB — GLUCOSE, CAPILLARY: Glucose-Capillary: 89 mg/dL (ref 70–99)

## 2023-04-10 SURGERY — DIALYSIS/PERMA CATHETER INSERTION
Anesthesia: LOCAL

## 2023-04-10 MED ORDER — HEPARIN SODIUM (PORCINE) 1000 UNIT/ML IJ SOLN
INTRAMUSCULAR | Status: AC
  Filled 2023-04-10: qty 10

## 2023-04-10 MED ORDER — HEPARIN (PORCINE) IN NACL 1000-0.9 UT/500ML-% IV SOLN
INTRAVENOUS | Status: DC | PRN
  Administered 2023-04-10: 500 mL

## 2023-04-10 MED ORDER — HEPARIN SODIUM (PORCINE) 1000 UNIT/ML IJ SOLN
INTRAMUSCULAR | Status: DC | PRN
  Administered 2023-04-10 (×2): 1600 [IU] via INTRAVENOUS

## 2023-04-10 MED ORDER — MIDAZOLAM HCL 2 MG/2ML IJ SOLN
INTRAMUSCULAR | Status: DC | PRN
  Administered 2023-04-10: 1 mg via INTRAVENOUS

## 2023-04-10 MED ORDER — LIDOCAINE HCL (PF) 1 % IJ SOLN
INTRAMUSCULAR | Status: AC
  Filled 2023-04-10: qty 30

## 2023-04-10 MED ORDER — SODIUM CHLORIDE 0.9% FLUSH: 3.0000 mL | INTRAVENOUS | Status: DC | PRN

## 2023-04-10 MED ORDER — FENTANYL CITRATE (PF) 100 MCG/2ML IJ SOLN
INTRAMUSCULAR | Status: DC | PRN
  Administered 2023-04-10: 25 ug via INTRAVENOUS

## 2023-04-10 MED ORDER — LIDOCAINE HCL (PF) 1 % IJ SOLN
INTRAMUSCULAR | Status: DC | PRN
  Administered 2023-04-10: 15 mL via INTRADERMAL
  Administered 2023-04-10: 2 mL via INTRADERMAL

## 2023-04-10 MED ORDER — SODIUM CHLORIDE 0.9 % IV SOLN: INTRAVENOUS | Status: DC

## 2023-04-10 MED ORDER — ACETAMINOPHEN 325 MG PO TABS: 650.0000 mg | ORAL_TABLET | ORAL | Status: DC | PRN

## 2023-04-10 MED ORDER — MIDAZOLAM HCL 2 MG/2ML IJ SOLN
INTRAMUSCULAR | Status: AC
  Filled 2023-04-10: qty 2

## 2023-04-10 MED ORDER — ONDANSETRON HCL 4 MG/2ML IJ SOLN
4.0000 mg | Freq: Four times a day (QID) | INTRAMUSCULAR | Status: DC | PRN
Start: 2023-04-10 — End: 2023-04-10

## 2023-04-10 MED ORDER — FENTANYL CITRATE (PF) 100 MCG/2ML IJ SOLN
INTRAMUSCULAR | Status: AC
  Filled 2023-04-10: qty 2

## 2023-04-10 SURGICAL SUPPLY — 7 items
BAG SNAP BAND KOVER 36X36 (MISCELLANEOUS) IMPLANT
CATH PALINDROME-P 19CM W/VT (CATHETERS) IMPLANT
COVER DOME SNAP 22 D (MISCELLANEOUS) IMPLANT
GLIDEWIRE ANGLED SS 035X260CM (WIRE) IMPLANT
KIT MICROPUNCTURE NIT STIFF (SHEATH) IMPLANT
SHEATH PROBE COVER 6X72 (BAG) IMPLANT
TRAY PV CATH (CUSTOM PROCEDURE TRAY) ×1 IMPLANT

## 2023-04-10 NOTE — H&P (Signed)
 Chief Complaint: Need for dialysis catheter placement to initiate dialysis HPI:  89 year old man with past medical history significant for coronary artery disease status post PTCA, type 2 diabetes mellitus, HFpEF, hypertension, chronic back pain and progressive chronic kidney disease now with development of subtle uremic symptoms/end-stage renal disease.  He recently underwent for stage left brachiobasilic fistula creation on January 22, 2023 by Dr. Gretta and is on schedule for transposition in the near future.  He denies any chest pain or shortness of breath and has some mild dysgeusia without nausea, vomiting or diarrhea.  He denies any fever or chills and does not have dysuria/flank pain.  Past Medical History:  Diagnosis Date   Allergy     Anemia    CAD (coronary artery disease)    pci to LAD 10/09; stable CAD by cath 05/31/08; myoview 04/11/11- no ishcemia, EF 67%; echo 05/15/09- EF>55%, mod calcification of the aortic valve leaflets   CAD S/P percutaneous coronary angioplasty 05/08/2008   LAD PCI with DES 2009- Myoview low risk 2013   CHF (congestive heart failure) (HCC) 09/07/2019   Chronic back pain    Chronic kidney disease    Cystitis, radiation 12/01/2013   ED (erectile dysfunction)    Elevated serum creatinine 12/14/2015   Essential hypertension, benign    Hematuria 12/01/2013   History of blood transfusion    Hyperlipidemia    Ileus (HCC) 12/14/2015   Insulin  dependent type 2 diabetes mellitus, controlled (HCC)    Malignant neoplasm of prostate (HCC) 11/26/2013   Multiple rib fractures    left 9th, 10th, and 11th posterior rib fractures S/P fall 12/09/2015/notes 12/12/2015   Osteoarthritis of right knee 03/04/2018   Personal history of COVID-19 09/05/2021   PREMATURE VENTRICULAR CONTRACTIONS 05/08/2008   Qualifier: Diagnosis of  By: Kelsie, MD, James     Prostate cancer [facility])    s/p   Rectal bleeding 12/23/2012   Evaluated by Dr Rollin GI patient with history of radiation  proctitis. Patient due for colonoscopy on 04/2014    Type II or unspecified type diabetes mellitus without mention of complication, not stated as uncontrolled    Ventricular tachycardia (HCC)    resuscitated; monitor 05/2008; attempted T ablation 2/10- aborted due to inappropriate substrate    Past Surgical History:  Procedure Laterality Date   AV FISTULA PLACEMENT Left 12/02/2022   Procedure: LEFT ARM BRACHIOCEPHALIC ARTERIOVENOUS (AV) FISTULA CREATION;  Surgeon: Gretta Lonni PARAS, MD;  Location: [facility] OR;  Service: Vascular;  Laterality: Left;   AV FISTULA PLACEMENT Left 01/22/2023   Procedure: LEFT ARM BRACHIOBASILISC ARTERIOVENOUS (AV) FISTULA CREATION;  Surgeon: Gretta Lonni PARAS, MD;  Location: [facility] OR;  Service: Vascular;  Laterality: Left;   CARDIAC CATHETERIZATION  05/31/08   EF 55-60%, stable lesions, medical therapy   COLECTOMY  '80's   polyps   CORONARY ANGIOPLASTY WITH STENT PLACEMENT  01/19/08   PCI stent to mid LAD with Promus DES 27.5x28   CYSTOSCOPY WITH DIRECT VISION INTERNAL URETHROTOMY [name] 03/12/2021   Procedure: cystoscoopy withdirect vision internal urethrotomy optilum urethral dilation     ;  Surgeon: Carolee Sherwood JONETTA DOUGLAS, MD;  Location: [facility] ORS;  Service: Urology;  Laterality: [name];  RERQUESTING 45 MINS   IR GENERIC HISTORICAL  01/03/2016   IR THORACENTESIS ASP PLEURAL SPACE W/IMG GUIDE 01/03/2016 [facility]-INTER[facility] RAD   KNEE ARTHROPLASTY Right 03/04/2018   Procedure: COMPUTER ASSISTED TOTAL KNEE ARTHROPLASTY;  Surgeon: Fidel Rogue, MD;  Location: [facility] ORS;  Service: Orthopedics;  Laterality: Right;   PTCA  01/2008   TRANSURETHRAL RESECTION OF BLADDER TUMOR [name] 11/10/2020   Procedure: PHYLLIS BOYCE CHARLESETTA GEROLD;  Surgeon: Sherrilee Belvie CROME, MD;  Location: [facility];  Service: Urology;  Laterality: [name];   TRANSURETHRAL RESECTION OF BLADDER TUMOR [name] 08/12/2022   Procedure: TRANSURETHRAL RESECTION OF BLADDER TUMOR (TURBT);  Surgeon: Carolee Sherwood JONETTA DOUGLAS, MD;  Location: [facility]  ORS;  Service: Urology;  Laterality: [name];   TRANSURETHRAL RESECTION OF BLADDER TUMOR WITH MITOMYCIN -C [name] 09/17/2021   Procedure: TRANSURETHRAL RESECTION OF BLADDER TUMOR WITH GEMCITABINE ;  Surgeon: Carolee Sherwood JONETTA DOUGLAS, MD;  Location: [facility];  Service: Urology;  Laterality: [name];    Family History  Problem Relation Age of Onset   Diabetes Mother    Heart attack Mother    Leukemia Father    Heart disease Sister    Heart disease Brother    Diabetes Brother        pancreatic cancer   Social History:  reports that he quit smoking about 20 years ago. His smoking use included cigarettes. He has never used smokeless tobacco. He reports current drug use. Drug: Marijuana. He reports that he does not drink alcohol .  Allergies: No Known Allergies  Medications Prior to Admission  Medication Sig Dispense Refill   acetaminophen  (TYLENOL ) 500 MG tablet Take 1,000 mg by mouth every 8 (eight) hours as needed for moderate pain.     amLODipine  (NORVASC ) 10 MG tablet TAKE 1 TABLET EVERY DAY (APPOINTMENT IS NEEDED FOR REFILLS) 90 tablet 3   atorvastatin  (LIPITOR) 10 MG tablet TAKE 1 TABLET EVERY DAY 90 tablet 3   calcitRIOL  (ROCALTROL ) 0.25 MCG capsule Take 0.25 mcg by mouth daily.     carvedilol  (COREG ) 25 MG tablet TAKE 1 TABLET TWICE DAILY. KEEP OFFICE VISIT 180 tablet 3   Cholecalciferol  (VITAMIN D -3 PO) Take 1 tablet by mouth daily.     insulin  NPH-regular Human (NOVOLIN  70/30) (70-30) 100 UNIT/ML injection Inject 8 Units into the skin 2 (two) times daily with a meal.     isosorbide  mononitrate (IMDUR ) 60 MG 24 hr tablet Take 2 tablets (120 mg total) by mouth daily. (Patient taking differently: Take 60 mg by mouth in the morning and at bedtime.) 180 tablet 2   Multiple Vitamin (MULTIVITAMIN) tablet Take 1 tablet by mouth daily.     Omega 3 1200 MG CAPS Take 1,200 mg by mouth daily.     Polyethyl Glycol-Propyl Glycol (SYSTANE) 0.4-0.3 % SOLN Place 1 drop into both eyes as needed (dry eyes).     sodium  bicarbonate 650 MG tablet Take 650 mg by mouth daily.     tamsulosin  (FLOMAX ) 0.4 MG CAPS capsule Take 0.4 mg by mouth daily.     torsemide  (DEMADEX ) 20 MG tablet Take 2 tablets (40 mg total) by mouth 2 (two) times daily. In case of weight gain 2 to 3 lbs in 24 hrs or 5 lbs in 7 days, take one extra tablet a day in the morning, until weight back to baseline. (Patient taking differently: Take 20 mg by mouth 2 (two) times daily. In case of weight gain 2 to 3 lbs in 24 hrs or 5 lbs in 7 days, take one extra tablet a day in the morning, until weight back to baseline.) 80 tablet 0   VELTASSA  8.4 g packet Take 8.4 g by mouth every other day.     Blood Glucose Monitoring Suppl (ACCU-CHEK AVIVA PLUS) w/Device KIT Test blood sugar 3 times daily. Dx code: E11.22 1 kit 0  Blood Pressure Monitoring (BLOOD PRESS MONITOR/M-L CUFF) MISC 1 application by Does not apply route daily. 1 each 0   Continuous Blood Gluc Sensor (FREESTYLE LIBRE SENSOR SYSTEM) MISC 1 application by Does not apply route daily. 1 each 2   Continuous Glucose Monitor Sup KIT 1 kit by Does not apply route as directed. 1 kit 0   glucose blood (ACCU-CHEK AVIVA PLUS) test strip Test blood sugar 3 times daily. Dx code: E11.22 300 each 3   Insulin  Syringe-Needle U-100 (B-D INS SYR HALF-UNIT .3CC/31G) 31G X 5/16 0.3 ML MISC Use daily at bedtime to inject insulin . Dx code: 250.00 100 each 3   Lancets (ACCU-CHEK MULTICLIX) lancets Test blood sugar 3 times daily. Dx code: E11.22 300 each 3    No results found for this or any previous visit (from the past 48 hours). No results found.  Review of Systems  Constitutional:  Positive for appetite change and fatigue. Negative for chills and fever.  HENT:  Negative for nosebleeds, sinus pain and trouble swallowing.   Eyes:  Negative for photophobia and visual disturbance.  Respiratory:  Negative for cough, chest tightness and shortness of breath.   Cardiovascular:  Positive for leg swelling. Negative for  chest pain.  Gastrointestinal:  Negative for abdominal pain, diarrhea, nausea and vomiting.  Genitourinary:  Negative for dysuria and hematuria.  Musculoskeletal:  Positive for back pain. Negative for gait problem and joint swelling.  Neurological:  Negative for weakness and headaches.    There were no vitals taken for this visit. Physical Exam Vitals reviewed.  Constitutional:      Appearance: Normal appearance. He is obese. He is not ill-appearing.  HENT:     Head: Normocephalic and atraumatic.     Right Ear: External ear normal.     Left Ear: External ear normal.     Nose: Nose normal.     Mouth/Throat:     Mouth: Mucous membranes are dry.     Pharynx: Oropharynx is clear.  Eyes:     General: No scleral icterus.    Extraocular Movements: Extraocular movements intact.     Conjunctiva/sclera: Conjunctivae normal.  Cardiovascular:     Rate and Rhythm: Normal rate and regular rhythm.     Pulses: Normal pulses.     Heart sounds: Normal heart sounds.  Pulmonary:     Effort: Pulmonary effort is normal.     Breath sounds: Normal breath sounds. No rales.  Abdominal:     General: Abdomen is flat. Bowel sounds are normal.  Musculoskeletal:     Cervical back: Normal range of motion and neck supple.     Right lower leg: Edema present.     Left lower leg: Edema present.     Comments: 1+ bilateral lower extremity edema.  Faint thrill left BBF with antecubital seroma  Skin:    General: Skin is warm and dry.     Findings: No bruising or lesion.  Neurological:     Mental Status: He is alert and oriented to person, place, and time.      Assessment/Plan 1.  Progressive chronic kidney disease-now ESRD: Patient here for placement of tunneled hemodialysis catheter to initiate dialysis.  Procedure explained at length including a brief description of catheter care. 2.  Hypertension: Blood pressures currently within acceptable range, resume antihypertensive therapy postprocedure. 3.   Anemia: Without overt blood loss and management per outpatient dialysis unit protocol. 4.  Type 2 diabetes mellitus: N.p.o. after midnight for procedure, to resume diet  postprocedure.  [name] Blanch, MD 04/10/2023, 7:57 AM

## 2023-04-10 NOTE — Op Note (Signed)
 Patient presents for tunneled catheter insertion to initiate dialysis; ultrasound shows a good right IJ. The decision was made to place a RIJ tunneled dialysis catheter.  Summary:  1) Successful placement of a new 19 cm cuff to tip hemodialysis catheter (Palindrome) in the right internal jugular vein with the tip in the right atrium.  2) Recommendations to the dialysis unit TO NOT USE HEPARIN  FOR THE FIRST 24 HOURS.  Description of procedure: The right neck, chest and the catheter were prepped and draped in the usual sterile fashion.  Local anesthesia was provided by injecting lidocaine  1% at the neck site overlying the desired venotomy site. Using real time ultrasound guidance, I was able to cannulate the RIJ and advance the guidewire easily into the central circulation. The wire was then manipulated and advanced into the abdominal IVC. I then blunt dissected the tissue around the 5 Fr stylet until it was freely mobile. Then, after injecting local anesthesia into the desired track and exit site I made a small incision at the exit site and tunneled a 19 cm cuff to tip Palindrome dialysis catheter, pulling it out at the venotomy site. Sequential dilation with 12, 14, and finally the peelaway sheath were done with real time fluoroscopic guidance ensuring that we were straight always lined with the wire.   Then, I inserted the catheter over the wire and through the sheath. After removing the peelaway sheath, I adjusted the catheter until the tip of the catheter was positioned in the right atrium of the heart. The cuff of the catheter was positioned in the subcutaneous tunnel with the cuff approximately 2 cm from the exit site.   Both limbs of the catheter were aspirated and flushed with excellent flow noted. Both limbs of the catheter were locked with heparin  and sterile caps placed. The hub of the catheter was sutured to the chest wall with 2-0 nylon suture.   The neck incision was closed with a vertical  mattress 3-0 plain gut sutures and a purse-string 3-0 plain gut was placed at the tunnel exit site. 2-0 loose nylon sutures secured the hub to the chest wall.                         Sterile dressings were placed, and the patient returned to recovery in stable condition.  Sedation: 1mg  Versed , 25mcg Fentanyl . Sedation time .  Contrast. 0 mL  Monitoring: Because of the patient's comorbid conditions and sedation during the procedure, continuous EKG monitoring and O2 saturation monitoring was performed throughout the procedure by the RN. There were no abnormal arrhythmias encountered.  Complications: None.  Diagnoses:   N18.6 End stage renal disease Z99.2 Dependence on renal dialysis  Procedures Coding:  36558 Tunneled catheter insertion [id number] Ultrasound guidance 22998  Fluoroscopy guidance for catheter insertion. V0037 Contrast  Recommendations: Remove the suture in 3 weeks. 2.   Report any blood flow problems to [facility].  Discharge: The patient was discharged home in stable condition. The patient was given education regarding the care of the catheter and specific instructions in case of any problems.

## 2023-04-10 NOTE — Discharge Instructions (Signed)
 OK to discharge home after 12:30PM if clinically stable

## 2023-04-11 ENCOUNTER — Encounter (HOSPITAL_COMMUNITY): Payer: Self-pay | Admitting: Nephrology

## 2023-04-30 ENCOUNTER — Encounter (HOSPITAL_COMMUNITY): Payer: Self-pay | Admitting: Vascular Surgery

## 2023-04-30 ENCOUNTER — Other Ambulatory Visit: Payer: Self-pay

## 2023-04-30 NOTE — Progress Notes (Addendum)
PCP - Bernadette Hoit, MD  Cardiologist - Runell Gess, MD  Dialysis T.Th.Sat PPM/ICD - denies Device Orders - n/a Rep Notified - n/a  Chest x-ray - 02-08-23, EKG - 02-08-23 Stress Test -  ECHO - 02-09-23 Cardiac Cath - 05-31-2008  CPAP - denies  Fasting Blood Sugar - per patient between 108-110 Checks Blood Sugar Libre to right upper arm  Blood Thinner Instructions: denies Aspirin Instructions: n/a  ERAS Protcol - NPO  COVID TEST- n/a  Anesthesia review: yes Hx of DM, CAD, ESRD, HTN, CKD Patient verbally denies any shortness of breath, fever, cough and chest pain during phone call   -------------  SDW INSTRUCTIONS given:  Your procedure is scheduled on May 02, 2023.  Report to [facility] Main Entrance "A" at 7:00 A.M., and check in at the Admitting office.  Call this number if you have problems the morning of surgery:  [phone]   Remember:  Do not eat or drink  after midnight the night before your surgery      Take these medicines the morning of surgery with A SIP OF WATER  amLODipine (NORVASC)  atorvastatin (LIPITOR)  carvedilol (COREG)  isosorbide mononitrate (IMDUR)  tamsulosin (FLOMAX)   IF NEEDED acetaminophen (TYLENOL)    WHAT DO I DO ABOUT MY DIABETES MEDICATION?   Do not take oral diabetes medicines (pills) the morning of surgery.  THE NIGHT BEFORE SURGERY, take 5 units of NOVOLIN 70/30 insulin.       THE MORNING OF SURGERY, take 0 units of NOVOLIN 70/30 insulin.  The day of surgery, do not take other diabetes injectables, including Byetta (exenatide), Bydureon (exenatide ER), Victoza (liraglutide), or Trulicity (dulaglutide).  If your CBG is greater than 220 mg/dL, you may take  of your sliding scale (correction) dose of insulin.   HOW TO MANAGE YOUR DIABETES BEFORE AND AFTER SURGERY  Why is it important to control my blood sugar before and after surgery? Improving blood sugar levels before and after surgery helps healing  and can limit problems. A way of improving blood sugar control is eating a healthy diet by:  Eating less sugar and carbohydrates  Increasing activity/exercise  Talking with your doctor about reaching your blood sugar goals High blood sugars (greater than 180 mg/dL) can raise your risk of infections and slow your recovery, so you will need to focus on controlling your diabetes during the weeks before surgery. Make sure that the doctor who takes care of your diabetes knows about your planned surgery including the date and location.  How do I manage my blood sugar before surgery? Check your blood sugar at least 4 times a day, starting 2 days before surgery, to make sure that the level is not too high or low.  Check your blood sugar the morning of your surgery when you wake up and every 2 hours until you get to the Short Stay unit.  If your blood sugar is less than 70 mg/dL, you will need to treat for low blood sugar: Do not take insulin. Treat a low blood sugar (less than 70 mg/dL) with  cup of clear juice (cranberry or apple), 4 glucose tablets, OR glucose gel. Recheck blood sugar in 15 minutes after treatment (to make sure it is greater than 70 mg/dL). If your blood sugar is not greater than 70 mg/dL on recheck, call [phone] for further instructions. Report your blood sugar to the short stay nurse when you get to Short Stay.  If you are admitted to  the hospital after surgery: Your blood sugar will be checked by the staff and you will probably be given insulin after surgery (instead of oral diabetes medicines) to make sure you have good blood sugar levels. The goal for blood sugar control after surgery is 80-180 mg/dL.  As of today, STOP taking any Aspirin (unless otherwise instructed by your surgeon) Aleve, Naproxen, Ibuprofen, Motrin, Advil, Goody's, BC's, all herbal medications, fish oil, and all vitamins.                      Do not wear jewelry, make up, or nail polish             Do not wear lotions, powders, perfumes/colognes, or deodorant.            Do not shave 48 hours prior to surgery.  Men may shave face and neck.            Do not bring valuables to the hospital.            [facility] is not responsible for any belongings or valuables.  Do NOT Smoke (Tobacco/Vaping) 24 hours prior to your procedure If you use a CPAP at night, you may bring all equipment for your overnight stay.   Contacts, glasses, dentures or bridgework may not be worn into surgery.      For patients admitted to the hospital, discharge time will be determined by your treatment team.   Patients discharged the day of surgery will not be allowed to drive home, and someone needs to stay with them for 24 hours.    Special instructions:   [facility]- Preparing For Surgery  Before surgery, you can play an important role. Because skin is not sterile, your skin needs to be as free of germs as possible. You can reduce the number of germs on your skin by washing with CHG (chlorahexidine gluconate) Soap before surgery.  CHG is an antiseptic cleaner which kills germs and bonds with the skin to continue killing germs even after washing.    Oral Hygiene is also important to reduce your risk of infection.  Remember - BRUSH YOUR TEETH THE MORNING OF SURGERY WITH YOUR REGULAR TOOTHPASTE  Please do not use if you have an allergy to CHG or antibacterial soaps. If your skin becomes reddened/irritated stop using the CHG.  Do not shave (including legs and underarms) for at least 48 hours prior to first CHG shower. It is OK to shave your face.  Please follow these instructions carefully.   Shower the NIGHT BEFORE SURGERY and the MORNING OF SURGERY with DIAL Soap.   Pat yourself dry with a CLEAN TOWEL.  Wear CLEAN PAJAMAS to bed the night before surgery  Place CLEAN SHEETS on your bed the night of your first shower and DO NOT SLEEP WITH PETS.   Day of Surgery: Please shower morning of surgery  Wear  Clean/Comfortable clothing the morning of surgery Do not apply any deodorants/lotions.   Remember to brush your teeth WITH YOUR REGULAR TOOTHPASTE.   Questions were answered. Patient verbalized understanding of instructions.

## 2023-05-02 ENCOUNTER — Encounter (HOSPITAL_COMMUNITY)
Admission: RE | Disposition: A | Discharge status: Home / Self Care | Payer: Self-pay | Source: Home / Self Care | Attending: Vascular Surgery

## 2023-05-02 ENCOUNTER — Other Ambulatory Visit (HOSPITAL_COMMUNITY): Payer: Self-pay

## 2023-05-02 ENCOUNTER — Ambulatory Visit (HOSPITAL_COMMUNITY)
Admission: RE | Admit: 2023-05-02 | Discharge: 2023-05-02 | Disposition: A | Discharge status: Home / Self Care | Payer: Medicare HMO | Attending: Vascular Surgery | Admitting: Vascular Surgery

## 2023-05-02 ENCOUNTER — Ambulatory Visit (HOSPITAL_BASED_OUTPATIENT_CLINIC_OR_DEPARTMENT_OTHER): Payer: Medicare HMO | Admitting: Physician Assistant

## 2023-05-02 ENCOUNTER — Other Ambulatory Visit: Payer: Self-pay

## 2023-05-02 ENCOUNTER — Ambulatory Visit (HOSPITAL_COMMUNITY): Payer: Medicare HMO | Admitting: Physician Assistant

## 2023-05-02 DIAGNOSIS — I2510 Atherosclerotic heart disease of native coronary artery without angina pectoris: Secondary | ICD-10-CM | POA: Insufficient documentation

## 2023-05-02 DIAGNOSIS — Z87891 Personal history of nicotine dependence: Secondary | ICD-10-CM | POA: Insufficient documentation

## 2023-05-02 DIAGNOSIS — Z955 Presence of coronary angioplasty implant and graft: Secondary | ICD-10-CM | POA: Diagnosis not present

## 2023-05-02 DIAGNOSIS — Z8546 Personal history of malignant neoplasm of prostate: Secondary | ICD-10-CM | POA: Diagnosis not present

## 2023-05-02 DIAGNOSIS — I132 Hypertensive heart and chronic kidney disease with heart failure and with stage 5 chronic kidney disease, or end stage renal disease: Secondary | ICD-10-CM | POA: Insufficient documentation

## 2023-05-02 DIAGNOSIS — Z794 Long term (current) use of insulin: Secondary | ICD-10-CM | POA: Diagnosis not present

## 2023-05-02 DIAGNOSIS — N186 End stage renal disease: Secondary | ICD-10-CM | POA: Diagnosis not present

## 2023-05-02 DIAGNOSIS — E1122 Type 2 diabetes mellitus with diabetic chronic kidney disease: Secondary | ICD-10-CM | POA: Diagnosis not present

## 2023-05-02 DIAGNOSIS — N185 Chronic kidney disease, stage 5: Secondary | ICD-10-CM | POA: Diagnosis not present

## 2023-05-02 DIAGNOSIS — I509 Heart failure, unspecified: Secondary | ICD-10-CM

## 2023-05-02 HISTORY — PX: BASCILIC VEIN TRANSPOSITION: SHX5742

## 2023-05-02 LAB — POCT I-STAT, CHEM 8
BUN: 28 mg/dL — ABNORMAL HIGH (ref 8–23)
Calcium, Ion: 0.94 mmol/L — ABNORMAL LOW (ref 1.15–1.40)
Chloride: 101 mmol/L (ref 98–111)
Creatinine, Ser: 5.9 mg/dL — ABNORMAL HIGH (ref 0.61–1.24)
Glucose, Bld: 122 mg/dL — ABNORMAL HIGH (ref 70–99)
HCT: 28 % — ABNORMAL LOW (ref 39.0–52.0)
Hemoglobin: 9.5 g/dL — ABNORMAL LOW (ref 13.0–17.0)
Potassium: 3.7 mmol/L (ref 3.5–5.1)
Sodium: 139 mmol/L (ref 135–145)
TCO2: 25 mmol/L (ref 22–32)

## 2023-05-02 LAB — GLUCOSE, CAPILLARY
Glucose-Capillary: 124 mg/dL — ABNORMAL HIGH (ref 70–99)
Glucose-Capillary: 100 mg/dL — ABNORMAL HIGH (ref 70–99)
Glucose-Capillary: 99 mg/dL (ref 70–99)

## 2023-05-02 SURGERY — TRANSPOSITION, VEIN, BASILIC
Anesthesia: General | Site: Arm Lower | Laterality: Left

## 2023-05-02 MED ORDER — HEPARIN SODIUM (PORCINE) 1000 UNIT/ML IJ SOLN
INTRAMUSCULAR | Status: DC | PRN
  Administered 2023-05-02: 5000 [IU] via INTRAVENOUS

## 2023-05-02 MED ORDER — ONDANSETRON HCL 4 MG/2ML IJ SOLN
INTRAMUSCULAR | Status: DC | PRN
  Administered 2023-05-02: 4 mg via INTRAVENOUS

## 2023-05-02 MED ORDER — CHLORHEXIDINE GLUCONATE 0.12 % MT SOLN
15.0000 mL | Freq: Once | OROMUCOSAL | Status: AC
  Administered 2023-05-02: 15 mL via OROMUCOSAL
  Filled 2023-05-02: qty 15

## 2023-05-02 MED ORDER — EPHEDRINE 5 MG/ML INJ
INTRAVENOUS | Status: AC
  Filled 2023-05-02: qty 5

## 2023-05-02 MED ORDER — INSULIN ASPART 100 UNIT/ML IJ SOLN
0.0000 [IU] | INTRAMUSCULAR | Status: DC | PRN
Start: 2023-05-02 — End: 2023-05-02

## 2023-05-02 MED ORDER — ORAL CARE MOUTH RINSE
15.0000 mL | Freq: Once | OROMUCOSAL | Status: AC
Start: 2023-05-02 — End: 2023-05-02

## 2023-05-02 MED ORDER — OXYCODONE HCL 5 MG/5ML PO SOLN: 5.0000 mg | Freq: Once | ORAL | Status: AC | PRN

## 2023-05-02 MED ORDER — HEPARIN SODIUM (PORCINE) 1000 UNIT/ML IJ SOLN
INTRAMUSCULAR | Status: AC
  Filled 2023-05-02: qty 10

## 2023-05-02 MED ORDER — CHLORHEXIDINE GLUCONATE 4 % EX SOLN: 60.0000 mL | Freq: Once | CUTANEOUS | Status: DC

## 2023-05-02 MED ORDER — PHENYLEPHRINE HCL-NACL 20-0.9 MG/250ML-% IV SOLN
INTRAVENOUS | Status: DC | PRN
  Administered 2023-05-02: 20 ug/min via INTRAVENOUS

## 2023-05-02 MED ORDER — ACETAMINOPHEN 10 MG/ML IV SOLN
INTRAVENOUS | Status: AC
  Filled 2023-05-02: qty 100

## 2023-05-02 MED ORDER — EPHEDRINE SULFATE-NACL 50-0.9 MG/10ML-% IV SOSY
PREFILLED_SYRINGE | INTRAVENOUS | Status: DC | PRN
  Administered 2023-05-02: 5 mg via INTRAVENOUS
  Administered 2023-05-02 (×2): 10 mg via INTRAVENOUS

## 2023-05-02 MED ORDER — PROPOFOL 10 MG/ML IV BOLUS
INTRAVENOUS | Status: AC
  Filled 2023-05-02: qty 20

## 2023-05-02 MED ORDER — 0.9 % SODIUM CHLORIDE (POUR BTL) OPTIME
TOPICAL | Status: DC | PRN
  Administered 2023-05-02: 1000 mL

## 2023-05-02 MED ORDER — OXYCODONE HCL 5 MG PO TABS
ORAL_TABLET | ORAL | Status: AC
  Filled 2023-05-02: qty 1

## 2023-05-02 MED ORDER — FENTANYL CITRATE (PF) 100 MCG/2ML IJ SOLN
INTRAMUSCULAR | Status: AC
  Filled 2023-05-02: qty 2

## 2023-05-02 MED ORDER — PROPOFOL 10 MG/ML IV BOLUS
INTRAVENOUS | Status: DC | PRN
  Administered 2023-05-02: 150 mg via INTRAVENOUS

## 2023-05-02 MED ORDER — CEFAZOLIN SODIUM-DEXTROSE 2-4 GM/100ML-% IV SOLN
2.0000 g | INTRAVENOUS | Status: AC
  Administered 2023-05-02: 2 g via INTRAVENOUS
  Filled 2023-05-02: qty 100

## 2023-05-02 MED ORDER — DEXAMETHASONE SODIUM PHOSPHATE 10 MG/ML IJ SOLN
INTRAMUSCULAR | Status: DC | PRN
  Administered 2023-05-02: 5 mg via INTRAVENOUS

## 2023-05-02 MED ORDER — DEXAMETHASONE SODIUM PHOSPHATE 10 MG/ML IJ SOLN
INTRAMUSCULAR | Status: AC
  Filled 2023-05-02: qty 1

## 2023-05-02 MED ORDER — OXYCODONE HCL 5 MG PO TABS
5.0000 mg | ORAL_TABLET | Freq: Once | ORAL | Status: AC | PRN
Start: 2023-05-02 — End: 2023-05-02
  Administered 2023-05-02: 5 mg via ORAL

## 2023-05-02 MED ORDER — LIDOCAINE 2% (20 MG/ML) 5 ML SYRINGE
INTRAMUSCULAR | Status: AC
  Filled 2023-05-02: qty 5

## 2023-05-02 MED ORDER — SODIUM CHLORIDE 0.9 % IV SOLN: INTRAVENOUS | Status: DC | PRN

## 2023-05-02 MED ORDER — OXYCODONE HCL 5 MG PO TABS
5.0000 mg | ORAL_TABLET | ORAL | 0 refills | Status: DC | PRN
  Filled 2023-05-02: qty 8, 2d supply, fill #0

## 2023-05-02 MED ORDER — PHENYLEPHRINE 80 MCG/ML (10ML) SYRINGE FOR IV PUSH (FOR BLOOD PRESSURE SUPPORT)
PREFILLED_SYRINGE | INTRAVENOUS | Status: DC | PRN
  Administered 2023-05-02: 80 ug via INTRAVENOUS
  Administered 2023-05-02 (×2): 160 ug via INTRAVENOUS

## 2023-05-02 MED ORDER — ONDANSETRON HCL 4 MG/2ML IJ SOLN
INTRAMUSCULAR | Status: AC
  Filled 2023-05-02: qty 2

## 2023-05-02 MED ORDER — HEPARIN 6000 UNIT IRRIGATION SOLUTION
Status: DC | PRN
  Administered 2023-05-02: 1

## 2023-05-02 MED ORDER — LIDOCAINE 2% (20 MG/ML) 5 ML SYRINGE
INTRAMUSCULAR | Status: DC | PRN
  Administered 2023-05-02: 100 mg via INTRAVENOUS

## 2023-05-02 MED ORDER — LIDOCAINE-EPINEPHRINE (PF) 1 %-1:200000 IJ SOLN
INTRAMUSCULAR | Status: AC
  Filled 2023-05-02: qty 30

## 2023-05-02 MED ORDER — HEPARIN 6000 UNIT IRRIGATION SOLUTION
Status: AC
  Filled 2023-05-02: qty 500

## 2023-05-02 MED ORDER — FENTANYL CITRATE (PF) 250 MCG/5ML IJ SOLN
INTRAMUSCULAR | Status: DC | PRN
  Administered 2023-05-02: 25 ug via INTRAVENOUS
  Administered 2023-05-02: 50 ug via INTRAVENOUS
  Administered 2023-05-02: 25 ug via INTRAVENOUS

## 2023-05-02 MED ORDER — ACETAMINOPHEN 10 MG/ML IV SOLN
1000.0000 mg | Freq: Once | INTRAVENOUS | Status: DC | PRN
  Administered 2023-05-02: 1000 mg via INTRAVENOUS

## 2023-05-02 SURGICAL SUPPLY — 36 items
ARMBAND PINK RESTRICT EXTREMIT (MISCELLANEOUS) ×1 IMPLANT
BAG COUNTER SPONGE SURGICOUNT (BAG) ×1 IMPLANT
BNDG ELASTIC 4INX 5YD STR LF (GAUZE/BANDAGES/DRESSINGS) IMPLANT
CANISTER SUCT 3000ML PPV (MISCELLANEOUS) ×1 IMPLANT
CLIP TI MEDIUM 24 (CLIP) ×1 IMPLANT
CLIP TI WIDE RED SMALL 24 (CLIP) ×1 IMPLANT
COVER PROBE W GEL 5X96 (DRAPES) ×1 IMPLANT
DERMABOND ADVANCED .7 DNX12 (GAUZE/BANDAGES/DRESSINGS) ×1 IMPLANT
DERMABOND ADVANCED .7 DNX6 (GAUZE/BANDAGES/DRESSINGS) IMPLANT
ELECT REM PT RETURN 9FT ADLT (ELECTROSURGICAL) ×1
ELECTRODE REM PT RTRN 9FT ADLT (ELECTROSURGICAL) ×1 IMPLANT
GLOVE BIO SURGEON STRL SZ7.5 (GLOVE) ×1 IMPLANT
GLOVE BIOGEL PI IND STRL 8 (GLOVE) ×1 IMPLANT
GOWN STRL REUS W/ TWL LRG LVL3 (GOWN DISPOSABLE) ×2 IMPLANT
GOWN STRL REUS W/ TWL XL LVL3 (GOWN DISPOSABLE) ×2 IMPLANT
GRAFT GORETEX STRT 4-7X45 (Vascular Products) IMPLANT
HEMOSTAT SPONGE AVITENE ULTRA (HEMOSTASIS) IMPLANT
KIT BASIN OR (CUSTOM PROCEDURE TRAY) ×1 IMPLANT
KIT TURNOVER KIT B (KITS) ×1 IMPLANT
NDL HYPO 25GX1X1/2 BEV (NEEDLE) ×1 IMPLANT
NEEDLE HYPO 25GX1X1/2 BEV (NEEDLE) ×1
NS IRRIG 1000ML POUR BTL (IV SOLUTION) ×1 IMPLANT
PACK CV ACCESS (CUSTOM PROCEDURE TRAY) ×1 IMPLANT
PAD ARMBOARD 7.5X6 YLW CONV (MISCELLANEOUS) ×2 IMPLANT
SLING ARM FOAM STRAP LRG (SOFTGOODS) IMPLANT
SLING ARM FOAM STRAP MED (SOFTGOODS) IMPLANT
SPIKE FLUID TRANSFER (MISCELLANEOUS) ×1 IMPLANT
SUT MNCRL AB 4-0 PS2 18 (SUTURE) ×1 IMPLANT
SUT PROLENE 6 0 BV (SUTURE) ×1 IMPLANT
SUT PROLENE 7 0 BV 1 (SUTURE) IMPLANT
SUT SILK 2 0 SH (SUTURE) IMPLANT
SUT VIC AB 2-0 CT1 TAPERPNT 27 (SUTURE) ×1 IMPLANT
SUT VIC AB 3-0 SH 27X BRD (SUTURE) ×2 IMPLANT
TOWEL GREEN STERILE (TOWEL DISPOSABLE) ×1 IMPLANT
UNDERPAD 30X36 HEAVY ABSORB (UNDERPADS AND DIAPERS) ×1 IMPLANT
WATER STERILE IRR 1000ML POUR (IV SOLUTION) ×1 IMPLANT

## 2023-05-02 NOTE — Transfer of Care (Signed)
Immediate Anesthesia Transfer of Care Note  Patient: BLAYN WHETSELL  Procedure(s) Performed: Insertion of LEFT Arm Arterio-venous Gortex Graft (Left: Arm Lower)  Patient Location: PACU  Anesthesia Type:General  Level of Consciousness: drowsy  Airway & Oxygen Therapy: Patient Spontanous Breathing and Patient connected to face mask oxygen  Post-op Assessment: Report given to RN and Post -op Vital signs reviewed and stable  Post vital signs: Reviewed and stable  Last Vitals:  Vitals Value Taken Time  BP 125/52 05/02/23 1300  Temp 36.4 C 05/02/23 1300  Pulse 66 05/02/23 1307  Resp 10 05/02/23 1307  SpO2 94 % 05/02/23 1307  Vitals shown include unfiled device data.  Last Pain:  Vitals:   05/02/23 1300  TempSrc:   PainSc: Asleep         Complications: No notable events documented.

## 2023-05-02 NOTE — Anesthesia Procedure Notes (Signed)
Procedure Name: LMA Insertion Date/Time: 05/02/2023 10:38 AM  Performed by: Camillia Herter, CRNAPre-anesthesia Checklist: Patient identified, Emergency Drugs available, Suction available and Patient being monitored Patient Re-evaluated:Patient Re-evaluated prior to induction Oxygen Delivery Method: Circle System Utilized Preoxygenation: Pre-oxygenation with 100% oxygen Induction Type: IV induction Ventilation: Mask ventilation without difficulty LMA: LMA inserted LMA Size: 5.0 Number of attempts: 1 Airway Equipment and Method: Bite block Placement Confirmation: positive ETCO2 Tube secured with: Tape Dental Injury: Teeth and Oropharynx as per pre-operative assessment

## 2023-05-02 NOTE — H&P (Signed)
History and Physical Interval Note:  05/02/2023 9:57 AM  [name]  has presented today for surgery, with the diagnosis of Chronic kidney disease, stage V.  The various methods of treatment have been discussed with the patient and family. After consideration of risks, benefits and other options for treatment, the patient has consented to  Procedure(s): LEFT SECOND STAGE BASILIC VEIN TRANSPOSITION (Left) as a surgical intervention.  The patient's history has been reviewed, patient examined, no change in status, stable for surgery.  I have reviewed the patient's chart and labs.  Questions were answered to the patient's satisfaction.    Plan was left second stage basilic vein transposition today.  I do not feel a thrill in his fistula.  Discussed with him and his wife that we will place AV graft in the left arm if the fistula is indeed thrombosed.  Will evaluate ultrasound in the operating room.  [name]   POST OPERATIVE OFFICE NOTE       CC:  F/u for surgery   HPI:  This is a 89 y.o. male who is s/p left BC AVF on 12/02/2022 by Dr. Chestine Spore.  This subsequently occluded and he underwent left 1st stage BVT on 01/22/2023 by Dr. Chestine Spore.    He was last seen on 03/11/2023 and at that time, he was not having any steal sx.  He did have a small area of swelling around the incision that was stable and non tender.  He was not having any fevers.     His fistula was maturing nicely and had an excellent thrill.  An ace wrap was placed with moderate compression and he was encouraged to elevate his arm.  He was scheduled for 2 week follow up.    He was not yet on HD and has appt with nephrologist on 04/01/2023.   Pt is here with his wife of 10 years.  Pt states he does not have pain/numbness in the left hand.  He is not yet on dialysis.    He states the wrap that was placed at last visit was itchy and he has taken it off.  He feels the swelling is slightly improved.           Allergies   No Known Allergies           Current Outpatient Medications  Medication Sig Dispense Refill   acetaminophen (TYLENOL) 500 MG tablet Take 1,000 mg by mouth every 8 (eight) hours as needed for moderate pain.       amLODipine (NORVASC) 10 MG tablet TAKE 1 TABLET EVERY DAY (APPOINTMENT IS NEEDED FOR REFILLS) (Patient taking differently: Take 10 mg by mouth daily.) 90 tablet 3   atorvastatin (LIPITOR) 10 MG tablet TAKE 1 TABLET EVERY DAY (Patient taking differently: Take 10 mg by mouth at bedtime.) 90 tablet 3   Blood Glucose Monitoring Suppl (ACCU-CHEK AVIVA PLUS) w/Device KIT Test blood sugar 3 times daily. Dx code: E11.22 1 kit 0   Blood Pressure Monitoring (BLOOD PRESS MONITOR/M-L CUFF) MISC 1 application by Does not apply route daily. 1 each 0   calcitRIOL (ROCALTROL) 0.25 MCG capsule Take 0.25 mcg by mouth daily.       carvedilol (COREG) 25 MG tablet TAKE 1 TABLET TWICE DAILY. KEEP OFFICE VISIT 180 tablet 3   Cholecalciferol (VITAMIN D-3 PO) Take 1 tablet by mouth daily.       Continuous Blood Gluc Sensor (FREESTYLE LIBRE SENSOR SYSTEM) MISC 1 application by Does not apply route  daily. 1 each 2   Continuous Glucose Monitor Sup KIT 1 kit by Does not apply route as directed. 1 kit 0   furosemide (LASIX) 40 MG tablet Take 20 mg by mouth as needed for fluid. Pt may take an additional 20mg  if no relief with first dose.       glucose blood (ACCU-CHEK AVIVA PLUS) test strip Test blood sugar 3 times daily. Dx code: E11.22 300 each 3   insulin NPH-regular Human (NOVOLIN 70/30) (70-30) 100 UNIT/ML injection Inject 8 Units into the skin 2 (two) times daily with a meal.       Insulin Syringe-Needle U-100 (B-D INS SYR HALF-UNIT .3CC/31G) 31G X 5/16" 0.3 ML MISC Use daily at bedtime to inject insulin. Dx code: 250.00 100 each 3   isosorbide mononitrate (IMDUR) 60 MG 24 hr tablet Take 2 tablets (120 mg total) by mouth daily. (Patient taking differently: Take 60 mg by mouth in the morning and at bedtime.) 180  tablet 2   Lancets (ACCU-CHEK MULTICLIX) lancets Test blood sugar 3 times daily. Dx code: E11.22 300 each 3   Multiple Vitamin (MULTIVITAMIN) tablet Take 1 tablet by mouth daily.       Omega-3 Fatty Acids (FISH OIL PO) Take 1 capsule by mouth daily.       Polyethyl Glycol-Propyl Glycol (SYSTANE) 0.4-0.3 % SOLN Place 1 drop into both eyes as needed (dry eyes).       sodium bicarbonate 650 MG tablet Take 650 mg by mouth daily.       tamsulosin (FLOMAX) 0.4 MG CAPS capsule Take 0.4 mg by mouth daily.       torsemide (DEMADEX) 20 MG tablet Take 2 tablets (40 mg total) by mouth 2 (two) times daily. In case of weight gain 2 to 3 lbs in 24 hrs or 5 lbs in 7 days, take one extra tablet a day in the morning, until weight back to baseline. 80 tablet 0   VELTASSA 8.4 g packet Take 8.4 g by mouth every Monday, Wednesday, and Friday.          No current facility-administered medications for this visit.         ROS:  See HPI   Physical Exam:     Incision:  well healed.  Seroma/hematoma present and not much different from what is pictured at last visit.  Extremities:   There is an easily palpable left radial pulse pulse.   Motor and sensory are in tact.   There is a thrill/bruit present.      Dialysis Duplex on 03/04/2023: Findings:  +--------------------+----------+-----------------+--------+  AVF                PSV (cm/s)Flow Vol (mL/min)Comments  +--------------------+----------+-----------------+--------+  Native artery inflow   157           588                 +--------------------+----------+-----------------+--------+  AVF Anastomosis        386                               +--------------------+----------+-----------------+--------+    +------------+----------+-------------+----------+----------------+  OUTFLOW VEINPSV (cm/s)Diameter (cm)Depth (cm)    Describe      +------------+----------+-------------+----------+----------------+  Prox UA        142                                              +------------+----------+-------------+----------+----------------+  Mid UA         201        0.62        1.16                     +------------+----------+-------------+----------+----------------+  Dist UA        260        0.54        1.21                     +------------+----------+-------------+----------+----------------+  AC Fossa       419        0.46        1.32   competing branch  +------------+----------+-------------+----------+----------------+   Summary:  Patent arteriovenous fistula.   Seroma at the antecubital fossa measuring approximately 2.15 cm x 3.32 cm.      Assessment/Plan:  This is a 89 y.o. male who is s/p:  left BC AVF on 12/02/2022 by Dr. Chestine Spore.  This subsequently occluded and he underwent left 1st stage BVT on 01/22/2023 by Dr. Chestine Spore.    -the pt does not have evidence of steal. -he is not yet on HD. -his fistula is maturing.  I discussed with Dr. Chestine Spore and ok to proceed with 2nd stage BVT.  He will wash out hematoma/seroma at time of surgery.  Pt would like to wait until after the holidays, which is fine.   -he is not on [facility] -discussed with pt and wife that access does not last forever and may need additional procedures or even new access in the future.  They both expressed understanding.      Doreatha Massed, [facility] [phone]   Clinic MD:  Chestine Spore

## 2023-05-02 NOTE — Anesthesia Preprocedure Evaluation (Signed)
Anesthesia Evaluation  Patient identified by MRN, date of birth, ID band Patient awake    Reviewed: Allergy & Precautions, NPO status , Patient's Chart, lab work & pertinent test results, reviewed documented beta blocker date and time   History of Anesthesia Complications Negative for: history of anesthetic complications  Airway Mallampati: II  TM Distance: >3 FB     Dental  (+) Edentulous Upper, Edentulous Lower   Pulmonary shortness of breath and with exertion, neg COPD, neg recent URI, former smoker   breath sounds clear to auscultation       Cardiovascular hypertension, + CAD, + Cardiac Stents and +CHF  (-) Past MI and (-) CABG + Valvular Problems/Murmurs AS  Rhythm:Regular Rate:Normal  Normal LVEF, moderate PH, moderate LVH, moderate AS   Neuro/Psych neg Seizures    GI/Hepatic ,,,(+) neg Cirrhosis        Endo/Other  diabetes, Type 2    Renal/GU ESRF and DialysisRenal disease     Musculoskeletal  (+) Arthritis ,    Abdominal   Peds  Hematology  (+) Blood dyscrasia, anemia   Anesthesia Other Findings   Reproductive/Obstetrics                              Anesthesia Physical Anesthesia Plan  ASA: 3  Anesthesia Plan: General   Post-op Pain Management:    Induction: Intravenous  PONV Risk Score and Plan: 2 and Ondansetron and Dexamethasone  Airway Management Planned: LMA  Additional Equipment:   Intra-op Plan:   Post-operative Plan: Extubation in OR  Informed Consent: I have reviewed the patients History and Physical, chart, labs and discussed the procedure including the risks, benefits and alternatives for the proposed anesthesia with the patient or authorized representative who has indicated his/her understanding and acceptance.     Dental advisory given  Plan Discussed with: CRNA  Anesthesia Plan Comments:          Anesthesia Quick Evaluation

## 2023-05-02 NOTE — Discharge Instructions (Signed)
[facility]  Discharge Instructions  AV Fistula or Graft Surgery for Dialysis Access  Please refer to the following instructions for your post-procedure care. Your surgeon or physician assistant will discuss any changes with you.  Activity  You may drive the day following your surgery, if you are comfortable and no longer taking prescription pain medication. Resume full activity as the soreness in your incision resolves.  Bathing/Showering  You may shower after you go home. Keep your incision dry for 48 hours. Do not soak in a bathtub, hot tub, or swim until the incision heals completely. You may not shower if you have a hemodialysis catheter.  Incision Care  Clean your incision with mild soap and water after 48 hours. Pat the area dry with a clean towel. You do not need a bandage unless otherwise instructed. Do not apply any ointments or creams to your incision. You may have skin glue on your incision. Do not peel it off. It will come off on its own in about one week. Your arm may swell a bit after surgery. To reduce swelling use pillows to elevate your arm so it is above your heart. Your doctor will tell you if you need to lightly wrap your arm with an ACE bandage.  Diet  Resume your normal diet. There are not special food restrictions following this procedure. In order to heal from your surgery, it is CRITICAL to get adequate nutrition. Your body requires vitamins, minerals, and protein. Vegetables are the best source of vitamins and minerals. Vegetables also provide the perfect balance of protein. Processed food has little nutritional value, so try to avoid this.  Medications  Resume taking all of your medications. If your incision is causing pain, you may take over-the counter pain relievers such as acetaminophen (Tylenol). If you were prescribed a stronger pain medication, please be aware these medications can cause nausea and constipation. Prevent  nausea by taking the medication with a snack or meal. Avoid constipation by drinking plenty of fluids and eating foods with high amount of fiber, such as fruits, vegetables, and grains.  Do not take Tylenol if you are taking prescription pain medications.  Follow up Your surgeon may want to see you in the office following your access surgery. If so, this will be arranged at the time of your surgery.  Please call us immediately for any of the following conditions:  Increased pain, redness, drainage (pus) from your incision site Fever of 101 degrees or higher Severe or worsening pain at your incision site Hand pain or numbness.  Reduce your risk of vascular disease:  Stop smoking. If you would like help, call [facility] at 1-800-QUIT-NOW ((207)843-6831) or [place] at [phone]  Manage your cholesterol Maintain a desired weight Control your diabetes Keep your blood pressure down  Dialysis  It will take several weeks to several months for your new dialysis access to be ready for use. Your surgeon will determine when it is okay to use it. Your nephrologist will continue to direct your dialysis. You can continue to use your Permcath until your new access is ready for use.   05/02/2023 GRADIE OHM [id number] [birth year]  Surgeon(s): [name], MD  Procedure(s): Insertion of LEFT Arm Arterio-venous Gortex Graft   May stick graft immediately   May stick graft on designated area only:   x Do not stick graft for 6 weeks    If you have any questions, please call the office at  [phone].  

## 2023-05-02 NOTE — Op Note (Signed)
OPERATIVE NOTE   DATE: May 02, 2023  PROCEDURE:  left upper arm arteriovenous graft (4 mm x 7 mm Gore-Tex)   PRE-OPERATIVE DIAGNOSIS: end stage renal disease   POST-OPERATIVE DIAGNOSIS: same  SURGEON: [name], MD  ASSISTANT(S): Loel Dubonnet, PA  ANESTHESIA:  LMA  ESTIMATED BLOOD LOSS: <50 mL  FINDING(S): I could not feel a thrill in the left first stage basilic vein fistula in preop.  I evaluated this in the OR with ultrasound and it was was thrombosed.  I then placed a left upper arm AV graft with palpable thrill at completion and palpable radial pulse at the wrist.  SPECIMEN(S):  None  INDICATIONS:   [name] is a 89 y.o. male who presents with ESRD and the need for permanent hemodialysis access.  The patient was scheduled for left second stage basilic vein fistula but I could not feel a thrill in the fistula in preop.  Intraoperative ultrasound confirmed thrombosis of the fistula..  Risk, benefits, and alternatives to access surgery were discussed.  The patient is aware the risks include but are not limited to: bleeding, infection, steal syndrome, nerve damage, ischemic monomelic neuropathy, failure to mature, and need for additional procedures.  The patient is aware of the risks and elects to proceed forward.  An assistant was needed given the complexity of the case and also for sewing the anastomosis.  DESCRIPTION: After full informed written consent was obtained from the patient, the patient was brought back to the operating room and placed supine upon the operating table.  The patient was given IV antibiotics prior to proceeding.  After obtaining adequate sedation, the patient was prepped and draped in standard fashion for a left arm access procedure.  I could not feel a thrill in the fistula in preop.  I evaluated the basilic vein fistula with ultrasound in the OR and there was thrombus in the fistula adjacent to the arterial anastomosis.  I elected  to place a left upper arm AV graft.   I turned my attention first to the antecubitum.  Under ultrasound guidance, I identified the location of the brachial artery and marked it on the skin.  I then looked on the upper arm near the axilla and marked the brachial vein as well as axillary vein. I made a longitudinal incision over the brachial artery above the antecubitum and another longitudinal incision over the brachial vein near the axillary vein.  I dissected down through the subcutaneous tissue and  fascia carefully and was able to dissect out the brachial artery.  The artery was about 4.0 mm externally.  It was controlled proximally and distally with vessel loops.  I then dissected out the larger brachial vein through the upper arm incision.  Externally, it appeared to be 5 mm in diameter.  I then dissected this vein proximally and distal.  I took a metal Gore tunneler and dissected from the antecubital incision to the axillary incision.  Then I delivered the 4 x 7-mm stretch Gore-Tex graft, through this metal tunneler and then pulled out the metal tunneler leaving the graft in place.  The 4-mm end was left on the brachial artery side and the 7-mm end toward the vein side.  I then gave the patient 5,000 units of heparin to gain anticoagulation.  After waiting 2 minutes, I placed the brachial artery under tension proximally and distally with vessel loops, made an arteriotomy and extended it with a Potts scissor.  I sewed the  4-mm end of the graft to this arteriotomy with a running stitch of 6-0 Prolene.  At this point, then I completed the anastomosis in the usual fashion.  I released the vessel loops on the inflow and allowed the artery to decompress through the graft. There was good pulsatile bleeding through this graft.  I clamped the graft near its arterial anastomosis and sucked out all the blood in the graft and loaded the graft with heparinized saline.  At this point, I pulled the graft to appropriate  length and reset my exposure of the high brachial vein. The vein was controlled with Henle clamps and opened with 11 blade scalpel and extended with Potts scissors.  There was good venous backbleeding from the vein. I spatulated the graft to facilitate an end-to-side anastomosis.  In the process of spatulating, I cut the graft to appropriate length for this anastomosis.  This graft was sewn to the vein in an end-to-side configuration with a 6-0 Prolene with the help of my assistant.  Prior to completing this anastomosis, I allowed the vein to back bleed and then I also allowed the artery to bleed in an antegrade fashion.  I completed this anastomosis in the usual fashion.  I did drain the seroma from the previous basilic vein fistula through the antecubital incision.  I irrigated out both incisions.  The graft had a good palpable thrill.  The radial artery was palpable.  The subcutaneous tissue in each incision was reapproximated with a running stitch of 3-0 Vicryl.  The skin was then reapproximated with a running subcuticular 4-0 Monocryl.  The skin was then cleaned, dried, and Dermabond used to reinforce the skin closure.   COMPLICATIONS: None  CONDITION: Stable  [name], MD [facility] Office: [phone]  [name]    05/02/2023, 12:08 PM

## 2023-05-03 ENCOUNTER — Encounter (HOSPITAL_COMMUNITY): Payer: Self-pay | Admitting: Vascular Surgery

## 2023-05-05 NOTE — Anesthesia Postprocedure Evaluation (Signed)
Anesthesia Post Note  Patient: [name]  Procedure(s) Performed: Insertion of LEFT Arm Arterio-venous Gortex Graft (Left: Arm Lower)     Anesthesia Type: General Anesthetic complications: no   No notable events documented.  Last Vitals:  Vitals:   05/02/23 1245 05/02/23 1300  BP: 129/63 (!) 125/52  Pulse: 64 69  Resp: 14 14  Temp:  36.4 C  SpO2: 100% 94%    Last Pain:  Vitals:   05/02/23 1300  TempSrc:   PainSc: Asleep                 Mariann Barter

## 2023-05-05 NOTE — Anesthesia Postprocedure Evaluation (Signed)
Anesthesia Post Note  Patient: [name]  Procedure(s) Performed: Insertion of LEFT Arm Arterio-venous Gortex Graft (Left: Arm Lower)     Patient location during evaluation: PACU Anesthesia Type: General Level of consciousness: awake and alert Pain management: pain level controlled Vital Signs Assessment: post-procedure vital signs reviewed and stable Respiratory status: spontaneous breathing, nonlabored ventilation, respiratory function stable and patient connected to nasal cannula oxygen Cardiovascular status: blood pressure returned to baseline and stable Postop Assessment: no apparent nausea or vomiting Anesthetic complications: no   No notable events documented.  Last Vitals:  Vitals:   05/02/23 1245 05/02/23 1300  BP: 129/63 (!) 125/52  Pulse: 64 69  Resp: 14 14  Temp:  36.4 C  SpO2: 100% 94%    Last Pain:  Vitals:   05/02/23 1300  TempSrc:   PainSc: Asleep                 Mariann Barter

## 2023-05-21 ENCOUNTER — Other Ambulatory Visit: Payer: Self-pay | Admitting: Student

## 2023-05-21 ENCOUNTER — Other Ambulatory Visit: Payer: Self-pay | Admitting: Cardiovascular Disease

## 2023-05-21 DIAGNOSIS — I10 Essential (primary) hypertension: Secondary | ICD-10-CM

## 2023-05-26 ENCOUNTER — Ambulatory Visit (INDEPENDENT_AMBULATORY_CARE_PROVIDER_SITE_OTHER): Payer: Medicare HMO | Admitting: Physician Assistant

## 2023-05-26 VITALS — BP 148/52 | HR 69 | Temp 97.7°F | Ht 72.0 in | Wt 210.4 lb

## 2023-05-26 DIAGNOSIS — N186 End stage renal disease: Secondary | ICD-10-CM

## 2023-05-26 DIAGNOSIS — Z992 Dependence on renal dialysis: Secondary | ICD-10-CM

## 2023-05-26 NOTE — Progress Notes (Signed)
    Postoperative Access Visit   History of Present Illness   [name] is a 89 y.o. year old male who presents for postoperative follow-up for: left upper arm arteriovenous graft (Date: 05/02/23).  Graft was placed due to intraoperative evaluation of first stage basilic fistula which appeared thrombosed.  The patient's wounds are  healed.  The patient denies steal symptoms.  The patient is able to complete their activities of daily living.  He is currently dialyzing via right [facility] on a Tuesday Thursday Saturday schedule.   Physical Examination   Vitals:   05/26/23 1015  BP: (!) 148/52  Pulse: 69  Temp: 97.7 F (36.5 C)  SpO2: 93%  Weight: 210 lb 6.4 oz (95.4 kg)  Height: 6' (1.829 m)   Body mass index is 28.54 kg/m.  left arm Incisions healed, palpable radial pulse, hand grip is 5/5, sensation in digits is intact, palpable thrill, bruit can be auscultated; small fluid collection near the arterial anastomosis    Medical Decision Making   [name] is a 89 y.o. year old male who presents s/p left upper arm arteriovenous graft  Patent L arm AVG without signs or symptoms of steal syndrome The patient's access will be ready for use 06/03/23 The patient's tunneled dialysis catheter can be removed when Nephrology is comfortable with the performance of the graft The patient may follow up on a prn basis; I asked the patient to notify our office if the fluid collection near the arterial anastomosis worsens or fails to shrink.  Plan is to manage this conservatively.  Any surgical intervention would increase the risk of left arm AV graft infection.   Emilie Rutter PA-C [facility] [place] Office: [phone]  Clinic MD: Chestine Spore on call

## 2023-08-07 ENCOUNTER — Encounter (HOSPITAL_COMMUNITY): Payer: Self-pay | Admitting: Nephrology

## 2023-08-07 ENCOUNTER — Telehealth: Payer: Self-pay

## 2023-08-07 NOTE — Telephone Encounter (Signed)
 Returned pt's wife's call regarding his "clogged" HD access. I have tried to call her back and her VM is full.

## 2023-08-08 ENCOUNTER — Ambulatory Visit (HOSPITAL_COMMUNITY)
Admission: RE | Admit: 2023-08-08 | Discharge: 2023-08-08 | Disposition: A | Discharge status: Home / Self Care | Attending: Nephrology | Admitting: Nephrology

## 2023-08-08 ENCOUNTER — Other Ambulatory Visit: Payer: Self-pay

## 2023-08-08 ENCOUNTER — Encounter (HOSPITAL_COMMUNITY)
Admission: RE | Disposition: A | Discharge status: Home / Self Care | Payer: Self-pay | Source: Home / Self Care | Attending: Nephrology

## 2023-08-08 DIAGNOSIS — T82868A Thrombosis of vascular prosthetic devices, implants and grafts, initial encounter: Secondary | ICD-10-CM | POA: Diagnosis present

## 2023-08-08 DIAGNOSIS — Z79899 Other long term (current) drug therapy: Secondary | ICD-10-CM | POA: Diagnosis not present

## 2023-08-08 DIAGNOSIS — Z8546 Personal history of malignant neoplasm of prostate: Secondary | ICD-10-CM | POA: Insufficient documentation

## 2023-08-08 DIAGNOSIS — Z794 Long term (current) use of insulin: Secondary | ICD-10-CM | POA: Insufficient documentation

## 2023-08-08 DIAGNOSIS — T82858A Stenosis of vascular prosthetic devices, implants and grafts, initial encounter: Secondary | ICD-10-CM | POA: Insufficient documentation

## 2023-08-08 DIAGNOSIS — N186 End stage renal disease: Secondary | ICD-10-CM | POA: Insufficient documentation

## 2023-08-08 DIAGNOSIS — D631 Anemia in chronic kidney disease: Secondary | ICD-10-CM | POA: Insufficient documentation

## 2023-08-08 DIAGNOSIS — I2510 Atherosclerotic heart disease of native coronary artery without angina pectoris: Secondary | ICD-10-CM | POA: Insufficient documentation

## 2023-08-08 DIAGNOSIS — E1122 Type 2 diabetes mellitus with diabetic chronic kidney disease: Secondary | ICD-10-CM | POA: Insufficient documentation

## 2023-08-08 DIAGNOSIS — Y832 Surgical operation with anastomosis, bypass or graft as the cause of abnormal reaction of the patient, or of later complication, without mention of misadventure at the time of the procedure: Secondary | ICD-10-CM | POA: Insufficient documentation

## 2023-08-08 DIAGNOSIS — Z87891 Personal history of nicotine dependence: Secondary | ICD-10-CM | POA: Diagnosis not present

## 2023-08-08 DIAGNOSIS — Z992 Dependence on renal dialysis: Secondary | ICD-10-CM | POA: Diagnosis not present

## 2023-08-08 DIAGNOSIS — I132 Hypertensive heart and chronic kidney disease with heart failure and with stage 5 chronic kidney disease, or end stage renal disease: Secondary | ICD-10-CM | POA: Diagnosis not present

## 2023-08-08 DIAGNOSIS — I509 Heart failure, unspecified: Secondary | ICD-10-CM | POA: Diagnosis not present

## 2023-08-08 HISTORY — PX: A/V SHUNT INTERVENTION: CATH118220

## 2023-08-08 SURGERY — A/V SHUNT INTERVENTION
Anesthesia: LOCAL

## 2023-08-08 MED ORDER — MIDAZOLAM HCL 2 MG/2ML IJ SOLN
INTRAMUSCULAR | Status: AC
  Filled 2023-08-08: qty 2

## 2023-08-08 MED ORDER — MIDAZOLAM HCL 2 MG/2ML IJ SOLN
INTRAMUSCULAR | Status: DC | PRN
  Administered 2023-08-08: 2 mg via INTRAVENOUS

## 2023-08-08 MED ORDER — IODIXANOL 320 MG/ML IV SOLN
INTRAVENOUS | Status: DC | PRN
  Administered 2023-08-08: 12 mL

## 2023-08-08 MED ORDER — HEPARIN SODIUM (PORCINE) 1000 UNIT/ML IJ SOLN
INTRAMUSCULAR | Status: DC | PRN
  Administered 2023-08-08: 5000 [IU] via INTRAVENOUS

## 2023-08-08 MED ORDER — SODIUM CHLORIDE 0.9 % IV SOLN
INTRAVENOUS | Status: DC
Start: 2023-08-08 — End: 2023-08-08

## 2023-08-08 MED ORDER — FENTANYL CITRATE (PF) 100 MCG/2ML IJ SOLN
INTRAMUSCULAR | Status: DC | PRN
  Administered 2023-08-08: 25 ug via INTRAVENOUS

## 2023-08-08 MED ORDER — LIDOCAINE HCL (PF) 1 % IJ SOLN
INTRAMUSCULAR | Status: DC | PRN
  Administered 2023-08-08 (×2): 5 mL

## 2023-08-08 MED ORDER — LIDOCAINE HCL (PF) 1 % IJ SOLN
INTRAMUSCULAR | Status: AC
  Filled 2023-08-08: qty 30

## 2023-08-08 MED ORDER — HEPARIN (PORCINE) IN NACL 1000-0.9 UT/500ML-% IV SOLN
INTRAVENOUS | Status: DC | PRN
  Administered 2023-08-08: 500 mL

## 2023-08-08 MED ORDER — FENTANYL CITRATE (PF) 100 MCG/2ML IJ SOLN
INTRAMUSCULAR | Status: AC
  Filled 2023-08-08: qty 2

## 2023-08-08 SURGICAL SUPPLY — 13 items
BAG SNAP BAND KOVER 36X36 (MISCELLANEOUS) ×1 IMPLANT
BALLOON FOGARTY 5FR 40 (CATHETERS) IMPLANT
BALLOON MUSTANG 8X80X75 (BALLOONS) IMPLANT
CATH MACH 1 ST 7FR 55 (CATHETERS) IMPLANT
COVER DOME SNAP 22 D (MISCELLANEOUS) ×1 IMPLANT
GUIDEWIRE ANGLED .035 180CM (WIRE) IMPLANT
SHEATH PINNACLE R/O II 5F 6CM (SHEATH) IMPLANT
SHEATH PINNACLE R/O II 7F 4CM (SHEATH) IMPLANT
SHEATH PROBE COVER 6X72 (BAG) ×1 IMPLANT
STATION PROTECTION PRESSURIZED (MISCELLANEOUS) ×1 IMPLANT
STOPCOCK MORSE 400PSI 3WAY (MISCELLANEOUS) ×1 IMPLANT
TRAY PV CATH (CUSTOM PROCEDURE TRAY) ×1 IMPLANT
TUBING CIL FLEX 10 FLL-RA (TUBING) ×1 IMPLANT

## 2023-08-08 NOTE — H&P (Signed)
 [name] is an 89 y.o. male.   Chief Complaint: Thrombosed left upper arm AV graft HPI:  89 year old man with past medical history significant for type 2 diabetes mellitus, hypertension, congestive heart failure, coronary artery disease status post PCI, history of prostate cancer and end-stage renal disease on hemodialysis.  He has a left upper arm AV graft that was created 3 months ago (January 2025) by Dr. Fulton Job.  He is referred for a thrombosed access and need to restore patency/dialysis access.  His last hemodialysis treatment was 3 days ago and he denies any chest pain or shortness of breath.  He denies any fevers or chills and does not have any local complaints over his graft.  Past Medical History:  Diagnosis Date   Allergy     Anemia    CAD (coronary artery disease)    pci to LAD 10/09; stable CAD by cath 05/31/08; myoview 04/11/11- no ishcemia, EF 67%; echo 05/15/09- EF>55%, mod calcification of the aortic valve leaflets   CAD S/P percutaneous coronary angioplasty 05/08/2008   LAD PCI with DES 2009- Myoview low risk 2013   CHF (congestive heart failure) (HCC) 09/07/2019   Chronic back pain    Chronic kidney disease    Cystitis, radiation 12/01/2013   Dyspnea    ED (erectile dysfunction)    Elevated serum creatinine 12/14/2015   Essential hypertension, benign    Hematuria 12/01/2013   History of blood transfusion    Hyperlipidemia    Ileus (HCC) 12/14/2015   Insulin  dependent type 2 diabetes mellitus, controlled (HCC)    Malignant neoplasm of prostate (HCC) 11/26/2013   Multiple rib fractures    left 9th, 10th, and 11th posterior rib fractures S/P fall 12/09/2015/notes 12/12/2015   Osteoarthritis of right knee 03/04/2018   Personal history of COVID-19 09/05/2021   PREMATURE VENTRICULAR CONTRACTIONS 05/08/2008   Qualifier: Diagnosis of  By: Nunzio Belch, MD, James     Prostate cancer [facility])    s/p   Rectal bleeding 12/23/2012   Evaluated by Dr Nickey Barn GI patient with history of  radiation proctitis. Patient due for colonoscopy on 04/2014    Type II or unspecified type diabetes mellitus without mention of complication, not stated as uncontrolled    Ventricular tachycardia (HCC)    resuscitated; monitor 05/2008; attempted T ablation 2/10- aborted due to inappropriate substrate    Past Surgical History:  Procedure Laterality Date   AV FISTULA PLACEMENT Left 12/02/2022   Procedure: LEFT ARM BRACHIOCEPHALIC ARTERIOVENOUS (AV) FISTULA CREATION;  Surgeon: Young Hensen, MD;  Location: [facility] OR;  Service: Vascular;  Laterality: Left;   AV FISTULA PLACEMENT Left 01/22/2023   Procedure: LEFT ARM BRACHIOBASILISC ARTERIOVENOUS (AV) FISTULA CREATION;  Surgeon: Young Hensen, MD;  Location: [facility] OR;  Service: Vascular;  Laterality: Left;   BASCILIC VEIN TRANSPOSITION Left 05/02/2023   Procedure: Insertion of LEFT Arm Arterio-venous Gortex Graft;  Surgeon: Young Hensen, MD;  Location: [facility] OR;  Service: Vascular;  Laterality: Left;   CARDIAC CATHETERIZATION  05/31/08   EF 55-60%, stable lesions, medical therapy   COLECTOMY  '80's   polyps   CORONARY ANGIOPLASTY WITH STENT PLACEMENT  01/19/08   PCI stent to mid LAD with Promus DES 27.5x28   CYSTOSCOPY WITH DIRECT VISION INTERNAL URETHROTOMY [name] 03/12/2021   Procedure: cystoscoopy withdirect vision internal urethrotomy optilum urethral dilation     ;  Surgeon: Samson Croak, MD;  Location: [facility] ORS;  Service: Urology;  Laterality: [name];  RERQUESTING 45 MINS  DIALYSIS/PERMA CATHETER INSERTION [name] 04/10/2023   Procedure: DIALYSIS/PERMA CATHETER INSERTION;  Surgeon: Melodie Spry, MD;  Location: [facility] INVASIVE CV LAB;  Service: Cardiovascular;  Laterality: [name];   IR GENERIC HISTORICAL  01/03/2016   IR THORACENTESIS ASP PLEURAL SPACE W/IMG GUIDE 01/03/2016 [facility]-INTERV RAD   KNEE ARTHROPLASTY Right 03/04/2018   Procedure: COMPUTER ASSISTED TOTAL KNEE ARTHROPLASTY;  Surgeon: Adonica Hoose, MD;  Location: [facility] ORS;  Service:  Orthopedics;  Laterality: Right;   PTCA  01/2008   TRANSURETHRAL RESECTION OF BLADDER TUMOR [name] 11/10/2020   Procedure: Sheron Dixons;  Surgeon: Marco Severs, MD;  Location: [facility] ORS;  Service: Urology;  Laterality: [name];   TRANSURETHRAL RESECTION OF BLADDER TUMOR [name] 08/12/2022   Procedure: TRANSURETHRAL RESECTION OF BLADDER TUMOR (TURBT);  Surgeon: Samson Croak, MD;  Location: [facility] ORS;  Service: Urology;  Laterality: [name];   TRANSURETHRAL RESECTION OF BLADDER TUMOR WITH MITOMYCIN -C [name] 09/17/2021   Procedure: TRANSURETHRAL RESECTION OF BLADDER TUMOR WITH GEMCITABINE ;  Surgeon: Samson Croak, MD;  Location: [facility] ORS;  Service: Urology;  Laterality: [name];    Family History  Problem Relation Age of Onset   Diabetes Mother    Heart attack Mother    Leukemia Father    Heart disease Sister    Heart disease Brother    Diabetes Brother        pancreatic cancer   Social History:  reports that he quit smoking about 21 years ago. His smoking use included cigarettes. He has never used smokeless tobacco. He reports current drug use. Drug: Marijuana. He reports that he does not drink alcohol .  Allergies: No Known Allergies  Medications Prior to Admission  Medication Sig Dispense Refill   acetaminophen  (TYLENOL ) 500 MG tablet Take 1,000 mg by mouth every 8 (eight) hours as needed for moderate pain.     amLODipine  (NORVASC ) 10 MG tablet TAKE 1 TABLET EVERY DAY (APPOINTMENT IS NEEDED FOR REFILLS) 90 tablet 1   atorvastatin  (LIPITOR) 10 MG tablet TAKE 1 TABLET EVERY DAY 90 tablet 3   B Complex-C-Folic Acid (DIALYVITE 800) 0.8 MG TABS Take 1 tablet by mouth daily.     calcitRIOL  (ROCALTROL ) 0.25 MCG capsule Take 0.25 mcg by mouth Every Tuesday,Thursday,and Saturday with dialysis.     carvedilol  (COREG ) 25 MG tablet TAKE 1 TABLET TWICE DAILY. KEEP OFFICE VISIT 180 tablet 3   Cholecalciferol  (VITAMIN D -3 PO) Take 1 tablet by mouth daily.     insulin  NPH-regular Human  (NOVOLIN  70/30) (70-30) 100 UNIT/ML injection Inject 8 Units into the skin 2 (two) times daily with a meal. (Patient taking differently: Inject 11 Units into the skin 2 (two) times daily with a meal.)     isosorbide  mononitrate (IMDUR ) 60 MG 24 hr tablet TAKE 2 TABLETS EVERY DAY 180 tablet 1   Polyethyl Glycol-Propyl Glycol (SYSTANE) 0.4-0.3 % SOLN Place 1 drop into both eyes as needed (dry eyes).     tamsulosin  (FLOMAX ) 0.4 MG CAPS capsule Take 0.4 mg by mouth daily.     torsemide  (DEMADEX ) 20 MG tablet Take 2 tablets (40 mg total) by mouth 2 (two) times daily. In case of weight gain 2 to 3 lbs in 24 hrs or 5 lbs in 7 days, take one extra tablet a day in the morning, until weight back to baseline. (Patient taking differently: Take 20 mg by mouth 2 (two) times daily. In case of weight gain 2 to 3 lbs in 24 hrs or 5 lbs in 7 days,  take one extra tablet a day in the morning, until weight back to baseline.) 80 tablet 0   Blood Glucose Monitoring Suppl (ACCU-CHEK AVIVA PLUS) w/Device KIT Test blood sugar 3 times daily. Dx code: E11.22 1 kit 0   Blood Pressure Monitoring (BLOOD PRESS MONITOR/M-L CUFF) MISC 1 application by Does not apply route daily. 1 each 0   Continuous Blood Gluc Sensor (FREESTYLE LIBRE SENSOR SYSTEM) MISC 1 application by Does not apply route daily. 1 each 2   Continuous Glucose Monitor Sup KIT 1 kit by Does not apply route as directed. 1 kit 0   glucose blood (ACCU-CHEK AVIVA PLUS) test strip Test blood sugar 3 times daily. Dx code: E11.22 300 each 3   Insulin  Syringe-Needle U-100 (B-D INS SYR HALF-UNIT .3CC/31G) 31G X 5/16" 0.3 ML MISC Use daily at bedtime to inject insulin . Dx code: 250.00 100 each 3   Lancets (ACCU-CHEK MULTICLIX) lancets Test blood sugar 3 times daily. Dx code: E11.22 300 each 3   oxyCODONE  (ROXICODONE ) 5 MG immediate release tablet Take 1 tablet (5 mg total) by mouth every 4 (four) hours as needed for severe pain (pain score 7-10). (Patient not taking: Reported on  08/07/2023) 8 tablet 0    No results found for this or any previous visit (from the past 48 hours). No results found.  Review of Systems  All other systems reviewed and are negative.   Blood pressure (!) 159/69, pulse 71, resp. rate 14, SpO2 100%. Physical Exam Vitals reviewed.  Constitutional:      Appearance: Normal appearance. He is obese.  HENT:     Head: Normocephalic and atraumatic.     Right Ear: External ear normal.     Left Ear: External ear normal.     Nose: Nose normal.     Mouth/Throat:     Mouth: Mucous membranes are dry.     Pharynx: Oropharynx is clear.  Eyes:     Extraocular Movements: Extraocular movements intact.     Conjunctiva/sclera: Conjunctivae normal.  Cardiovascular:     Rate and Rhythm: Normal rate and regular rhythm.     Pulses: Normal pulses.  Pulmonary:     Breath sounds: Normal breath sounds.  Musculoskeletal:     Cervical back: Normal range of motion and neck supple.     Comments: Left upper arm AV graft without thrill or bruit  Neurological:     Mental Status: He is alert.      Assessment/Plan 1.  Thrombosed left upper arm AV graft: The procedure of a percutaneous thrombectomy was explained to the patient and he consents to proceed.  I informed the patient that if unsuccessful, we would need to place a tunneled hemodialysis catheter. 2.  End-stage renal disease: Resume hemodialysis on TTS schedule following completion of procedure today. 3.  Hypertension: Blood pressure marginally elevated, monitor with moderate sedation during thrombectomy procedure. 4.  Anemia: Without overt blood loss, continue to monitor hemoglobin and hematocrit with ESA per protocol at outpatient dialysis.  Melodie Spry, MD 08/08/2023, 8:05 AM

## 2023-08-08 NOTE — Op Note (Signed)
 Patient referred for a thrombectomy of his left upper arm AVG placed on 05/01/2022.  This is his first endovascular intervention. On examination there is no pulse or bruit in the left upper arm arc graft. The left radial pulse is 1+.   Summary:  1) Successful thrombectomy of a left upper arm arc AVG with evidence of 70% venous anastomosis stenosis which was corrected with angioplasty (8 Mustang FE ~18 ATM). 2) Arterial anastomosis and inflow graft segments are patent. 3) Patent body of the graft and patent left axillary vein prominent/hypertrophic valves. Patent left sided central veins. 4) This left upper arm arc graft remains amenable to future percutaneous intervention.  Description of procedure: The left arm was prepped and draped in the usual fashion. The left upper arm arc AVG was first cannulated (11914) in the arterial limb in an antegrade direction and then a 7Fr sheath was inserted by guidewire exchange technique. A 0.035 guidewire was passed through the clotted graft into the central veins under fluoroscopic guidance and then a 5Fr diagnostic catheter inserted over the wire. The wire was removed, and then intravenous heparin  and sedative drugs administered. A pullback angiogram was performed by injecting contrast ([id number]) via the diagnostic catheter. This showed patent body of the graft, left axillary vein and left central veins with 70% stenosis at the venous anastomosis and evidence of filling defects in the graft consistent with thrombus. Three passes were made with the 7Fr [facility] 1 catheter from the axillary vein to the antegrade sheath to physically remove thrombus and perform the thrombectomy (62130). The catheter was removed, and an 8 x 80 mm Mustang angioplasty balloon was inserted over the wire to the level of the venous anastomosis stenosis followed by venous angioplasty (86578) carried out to 18ATM at only the site of stenosis to FULL effacement and then more gently in the rest of the  graft circuit to macerate the thrombus.    In order to remove this clot and remove the platelet plug at the arterial anastomosis, a second cannulation (46962) was required in a retrograde direction. A 7Fr sheath was inserted by guidewire exchange technique. A 4Fr Fogarty catheter was inserted through the sheath and advanced into the proximal brachial artery. The over the wire embolectomy balloon was inflated with 0.6cc of contrast and then pulled across the arterial anastomosis into the graft with aspiration of the sheath via the side port to remove the platelet plug and thrombus. [phone]). This step was repeated to sweep all residual clot from this side of the graft. In order to check the arterial inflow an arteriogram was necessary because refluxing contrast from the graft would have caused risk of embolizing thrombus from the graft into the arterial vasculature. A glidewire and straight catheter were inserted into the proximal brachial artery and an arteriogram performed by parking the tip of the catheter 2cm proximal to the arterial anastomosis. The arteriogram documented a good calibered proximal and distal brachial artery with slow distal runoff and no evidence of distal emboli. Follow up angiogram showed good blood flow through the graft circuit with some evidence of retained thrombus within the body of the graft. I then polished the entire outflow tract/graft circuit with the partially inflated 8mm angioplasty balloon to alleviate thrombus burden.  Completion outflow angiogram revealed rapid flows, no further retained thrombus burden, 10% residual stenosis at the venous anastomosis with prominent/hypertrophic axillary vein valves and with no dissection or extravasation. The graft had a good thrill and bruit.  Hemostasis: A 3-0 ethilon  purse string suture was placed at the cannulation site on removal of the sheath.  Sedation: 2 mg Versed , 25 mcg Fentanyl . Sedation time. 20  minutes  Contrast. 12  mL  Monitoring: Because of the patient's comorbid conditions and sedation during the procedure, continuous EKG monitoring and O2 saturation monitoring was performed throughout the procedure by the RN. There were no abnormal arrhythmias encountered.  Complications: None.   Diagnoses: N18.6 End stage renal disease  T82.858A Stenosis of vascular prosthetic devices, implants and grafts, initial encounter T82.868A Thrombosis of vascular prosthetic device/graft, initial  Procedure Coding:  [phone] Cannulation and angiogram of fistula/ graft, thrombectomy and venous angioplasty (venous anastomosis) X9147 Contrast  Recommendations:  1. Continue to cannulate the graft with 15G needles.  2. Refer back for problems with flows. 3. Remove the sutures next treatment.   Discharge: The patient was discharged home in stable condition. The patient was given education regarding the care of the dialysis access AVG and specific instructions in case of any problems.

## 2023-08-09 ENCOUNTER — Encounter (HOSPITAL_COMMUNITY): Payer: Self-pay | Admitting: Nephrology

## 2023-09-02 ENCOUNTER — Emergency Department (HOSPITAL_COMMUNITY)

## 2023-09-02 ENCOUNTER — Emergency Department (HOSPITAL_COMMUNITY)
Admission: EM | Admit: 2023-09-02 | Discharge: 2023-09-02 | Disposition: A | Discharge status: Home / Self Care | Attending: Emergency Medicine | Admitting: Emergency Medicine

## 2023-09-02 ENCOUNTER — Ambulatory Visit (HOSPITAL_COMMUNITY): Admission: RE | Admit: 2023-09-02 | Source: Home / Self Care | Admitting: Surgery

## 2023-09-02 ENCOUNTER — Encounter (HOSPITAL_COMMUNITY): Payer: Self-pay

## 2023-09-02 ENCOUNTER — Other Ambulatory Visit: Payer: Self-pay

## 2023-09-02 DIAGNOSIS — I509 Heart failure, unspecified: Secondary | ICD-10-CM | POA: Diagnosis not present

## 2023-09-02 DIAGNOSIS — M545 Low back pain, unspecified: Secondary | ICD-10-CM | POA: Diagnosis present

## 2023-09-02 DIAGNOSIS — R7989 Other specified abnormal findings of blood chemistry: Secondary | ICD-10-CM | POA: Insufficient documentation

## 2023-09-02 DIAGNOSIS — M79605 Pain in left leg: Secondary | ICD-10-CM | POA: Insufficient documentation

## 2023-09-02 DIAGNOSIS — Z794 Long term (current) use of insulin: Secondary | ICD-10-CM | POA: Insufficient documentation

## 2023-09-02 DIAGNOSIS — I132 Hypertensive heart and chronic kidney disease with heart failure and with stage 5 chronic kidney disease, or end stage renal disease: Secondary | ICD-10-CM | POA: Insufficient documentation

## 2023-09-02 DIAGNOSIS — Z992 Dependence on renal dialysis: Secondary | ICD-10-CM | POA: Diagnosis not present

## 2023-09-02 DIAGNOSIS — E1122 Type 2 diabetes mellitus with diabetic chronic kidney disease: Secondary | ICD-10-CM | POA: Insufficient documentation

## 2023-09-02 DIAGNOSIS — N186 End stage renal disease: Secondary | ICD-10-CM | POA: Insufficient documentation

## 2023-09-02 DIAGNOSIS — I2510 Atherosclerotic heart disease of native coronary artery without angina pectoris: Secondary | ICD-10-CM | POA: Diagnosis not present

## 2023-09-02 DIAGNOSIS — Z79899 Other long term (current) drug therapy: Secondary | ICD-10-CM | POA: Diagnosis not present

## 2023-09-02 DIAGNOSIS — Y92410 Unspecified street and highway as the place of occurrence of the external cause: Secondary | ICD-10-CM | POA: Diagnosis not present

## 2023-09-02 LAB — CBC WITH DIFFERENTIAL/PLATELET
Abs Immature Granulocytes: 0.01 10*3/uL (ref 0.00–0.07)
Basophils Absolute: 0 10*3/uL (ref 0.0–0.1)
Basophils Relative: 1 %
Eosinophils Absolute: 0.3 10*3/uL (ref 0.0–0.5)
Eosinophils Relative: 6 %
HCT: 36.4 % — ABNORMAL LOW (ref 39.0–52.0)
Hemoglobin: 11.6 g/dL — ABNORMAL LOW (ref 13.0–17.0)
Immature Granulocytes: 0 %
Lymphocytes Relative: 29 %
Lymphs Abs: 1.7 10*3/uL (ref 0.7–4.0)
MCH: 27.5 pg (ref 26.0–34.0)
MCHC: 31.9 g/dL (ref 30.0–36.0)
MCV: 86.3 fL (ref 80.0–100.0)
Monocytes Absolute: 0.6 10*3/uL (ref 0.1–1.0)
Monocytes Relative: 10 %
Neutro Abs: 3.1 10*3/uL (ref 1.7–7.7)
Neutrophils Relative %: 54 %
Platelets: 239 10*3/uL (ref 150–400)
RBC: 4.22 MIL/uL (ref 4.22–5.81)
RDW: 15.5 % (ref 11.5–15.5)
WBC: 5.8 10*3/uL (ref 4.0–10.5)
nRBC: 0 % (ref 0.0–0.2)

## 2023-09-02 LAB — BASIC METABOLIC PANEL WITH GFR
Anion gap: 12 (ref 5–15)
BUN: 44 mg/dL — ABNORMAL HIGH (ref 8–23)
CO2: 28 mmol/L (ref 22–32)
Calcium: 9.2 mg/dL (ref 8.9–10.3)
Chloride: 98 mmol/L (ref 98–111)
Creatinine, Ser: 8.95 mg/dL — ABNORMAL HIGH (ref 0.61–1.24)
GFR, Estimated: 5 mL/min — ABNORMAL LOW (ref >60)
Glucose, Bld: 106 mg/dL — ABNORMAL HIGH (ref 70–99)
Potassium: 4.8 mmol/L (ref 3.5–5.1)
Sodium: 138 mmol/L (ref 135–145)

## 2023-09-02 MED ORDER — METHOCARBAMOL 500 MG PO TABS
500.0000 mg | ORAL_TABLET | Freq: Once | ORAL | Status: AC
  Administered 2023-09-02: 500 mg via ORAL
  Filled 2023-09-02: qty 1

## 2023-09-02 MED ORDER — LIDOCAINE 5 % EX PTCH
1.0000 | MEDICATED_PATCH | CUTANEOUS | 0 refills | Status: DC
Start: 2023-09-02 — End: 2024-02-11

## 2023-09-02 MED ORDER — LIDOCAINE 5 % EX PTCH
1.0000 | MEDICATED_PATCH | CUTANEOUS | Status: DC
  Administered 2023-09-02: 1 via TRANSDERMAL
  Filled 2023-09-02: qty 1

## 2023-09-02 MED ORDER — METHOCARBAMOL 500 MG PO TABS: 500.0000 mg | ORAL_TABLET | Freq: Three times a day (TID) | ORAL | 0 refills | Status: DC | PRN

## 2023-09-02 MED ORDER — OXYCODONE-ACETAMINOPHEN 5-325 MG PO TABS
1.0000 | ORAL_TABLET | Freq: Once | ORAL | Status: AC
  Administered 2023-09-02: 1 via ORAL
  Filled 2023-09-02: qty 1

## 2023-09-02 MED ORDER — OXYCODONE-ACETAMINOPHEN 5-325 MG PO TABS: 1.0000 | ORAL_TABLET | Freq: Three times a day (TID) | ORAL | 0 refills | Status: AC | PRN
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
## 2023-09-02 NOTE — Discharge Instructions (Addendum)
 Your procedure has been rescheduled for tomorrow with Dr. Delaney Fearing.  Scheduled time is 1 PM.  Call in the morning to confirm and to find out what time they would like you to arrive.  Prescriptions for pain medications have been sent to your pharmacy: - Lidocaine  patches can be switched out daily.  These have limited side effects. - Methocarbamol  as a muscle relaxer.  This can cause symptoms of wooziness.  Take only as needed. - Oxycodone  is a narcotic pain medication.  This can also cause wooziness.  Take only as needed and avoid combined use with methocarbamol .  Your pain and soreness may be worse tomorrow but should improve over the subsequent days.  Return to the emergency department for any new or worsening symptoms of concern.
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
## 2023-09-03 ENCOUNTER — Ambulatory Visit (HOSPITAL_COMMUNITY)
Admission: RE | Discharge status: Home / Self Care | Payer: Self-pay | Source: Home / Self Care | Attending: Vascular Surgery

## 2023-09-03 ENCOUNTER — Other Ambulatory Visit: Payer: Self-pay

## 2023-09-03 ENCOUNTER — Ambulatory Visit (HOSPITAL_COMMUNITY)
Admission: RE | Admit: 2023-09-03 | Discharge: 2023-09-03 | Disposition: A | Discharge status: Home / Self Care | Attending: Vascular Surgery | Admitting: Vascular Surgery

## 2023-09-03 DIAGNOSIS — T82858A Stenosis of vascular prosthetic devices, implants and grafts, initial encounter: Secondary | ICD-10-CM

## 2023-09-03 DIAGNOSIS — I871 Compression of vein: Secondary | ICD-10-CM | POA: Diagnosis not present

## 2023-09-03 DIAGNOSIS — I509 Heart failure, unspecified: Secondary | ICD-10-CM | POA: Diagnosis not present

## 2023-09-03 DIAGNOSIS — Z87891 Personal history of nicotine dependence: Secondary | ICD-10-CM | POA: Insufficient documentation

## 2023-09-03 DIAGNOSIS — Z794 Long term (current) use of insulin: Secondary | ICD-10-CM | POA: Diagnosis not present

## 2023-09-03 DIAGNOSIS — T82868A Thrombosis of vascular prosthetic devices, implants and grafts, initial encounter: Secondary | ICD-10-CM

## 2023-09-03 DIAGNOSIS — Z992 Dependence on renal dialysis: Secondary | ICD-10-CM | POA: Insufficient documentation

## 2023-09-03 DIAGNOSIS — N186 End stage renal disease: Secondary | ICD-10-CM | POA: Diagnosis not present

## 2023-09-03 DIAGNOSIS — I132 Hypertensive heart and chronic kidney disease with heart failure and with stage 5 chronic kidney disease, or end stage renal disease: Secondary | ICD-10-CM | POA: Insufficient documentation

## 2023-09-03 DIAGNOSIS — Y832 Surgical operation with anastomosis, bypass or graft as the cause of abnormal reaction of the patient, or of later complication, without mention of misadventure at the time of the procedure: Secondary | ICD-10-CM | POA: Diagnosis not present

## 2023-09-03 DIAGNOSIS — E1122 Type 2 diabetes mellitus with diabetic chronic kidney disease: Secondary | ICD-10-CM | POA: Diagnosis not present

## 2023-09-03 HISTORY — PX: A/V SHUNT INTERVENTION: CATH118220

## 2023-09-03 HISTORY — PX: VENOUS STENT: CATH118377

## 2023-09-03 HISTORY — PX: PERIPHERAL VASCULAR THROMBECTOMY: CATH118306

## 2023-09-03 LAB — GLUCOSE, CAPILLARY: Glucose-Capillary: 102 mg/dL — ABNORMAL HIGH (ref 70–99)

## 2023-09-03 MED ORDER — LIDOCAINE HCL (PF) 1 % IJ SOLN
INTRAMUSCULAR | Status: AC
  Filled 2023-09-03: qty 30

## 2023-09-03 MED ORDER — HEPARIN SODIUM (PORCINE) 1000 UNIT/ML IJ SOLN
INTRAMUSCULAR | Status: AC
  Filled 2023-09-03: qty 10

## 2023-09-03 MED ORDER — MIDAZOLAM HCL 2 MG/2ML IJ SOLN
INTRAMUSCULAR | Status: AC
Start: 2023-09-03 — End: ?
  Filled 2023-09-03: qty 2

## 2023-09-03 MED ORDER — LIDOCAINE HCL (PF) 1 % IJ SOLN
INTRAMUSCULAR | Status: DC | PRN
  Administered 2023-09-03: 5 mL via SUBCUTANEOUS

## 2023-09-03 MED ORDER — LIDOCAINE IN D5W 4-5 MG/ML-% IV SOLN
INTRAVENOUS | Status: AC
  Filled 2023-09-03: qty 500

## 2023-09-03 MED ORDER — LIDOCAINE HCL (PF) 1 % IJ SOLN
INTRAMUSCULAR | Status: AC
Start: 2023-09-03 — End: ?
  Filled 2023-09-03: qty 30

## 2023-09-03 MED ORDER — HEPARIN SODIUM (PORCINE) 1000 UNIT/ML IJ SOLN
INTRAMUSCULAR | Status: DC | PRN
  Administered 2023-09-03: 5000 [IU] via INTRAVENOUS

## 2023-09-03 MED ORDER — MIDAZOLAM HCL 2 MG/2ML IJ SOLN: INTRAMUSCULAR | Status: DC | PRN

## 2023-09-03 MED ORDER — HEPARIN (PORCINE) IN NACL 1000-0.9 UT/500ML-% IV SOLN
INTRAVENOUS | Status: DC | PRN
  Administered 2023-09-03: 500 mL

## 2023-09-03 MED ORDER — IODIXANOL 320 MG/ML IV SOLN
INTRAVENOUS | Status: DC | PRN
  Administered 2023-09-03: 30 mL

## 2023-09-03 NOTE — Op Note (Signed)
    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 male with history of left-sided brachial artery to brachial vein AV graft by my partner Dr. Jimmye Moulds in January of this year.  Most recent intervention was graft thrombectomy earlier this month for occlusion with subsequent balloon venoplasty of the outflow.  The graft is once again down necessitating intervention.  After discussing risks and benefits, [name] elected to proceed.  FINDINGS:   Thrombosed left arm AV graft Greater than 70% stenosis of the arterial anastomosis Greater than 90% stenosis of the venous anastomosis, with 70% tapering lesion.  TECHNIQUE:   Patient was brought to the Cath Lab laid in supine position.  The fistula was accessed in 2 locations using a micropuncture needle in both the antegrade and retrograde fashion.  Using Seldinger technique, the micropuncture wires were replaced the 6 French sheath.  There was no filling of the sheaths indicative of graft occlusion.  Next, I ran a wire into the subclavian vein, and noted filling.  5000u of heparin  were administered.  I used a 7 x 40 mm balloon to macerate the clot within the AV graft and balloon plasty of the venous anastomosis.  The clot was suctioned out using the sheath with good return.  Next, I moved to the retrograde access and ran a wire into the brachial artery followed by a 5 [place] over-the-wire Fogarty.  This was  pulled through the brachial artery and into the graft.  While doing this, the 70% arterial stenosis was appreciated.  I was able to remove the top, and again suction thrombectomy through the sheath.  There was improved flow, but I elected to balloon angioplasty of the inflow using a 4 x 40 mm balloon.  Following this, I moved to assess outflow again.  There was a palpable pulse in the graft with imaging demonstrating 2 tandem 90% lesions at the venous anastomosis that was resistant to venoplasty.  I elected to place an 8 x 5 cm Gore Viabahn stent and used an 8 mm balloon to plasty the lesion after placement.  Even with an atmosphere of 20, I was unable to completely plasty the lesion.  Residual stenosis 10%.  Follow-up angiography demonstrated excellent result.  Graftotomy's were closed using Monocryl suture and the patient was taken to PACU in stable condition.  [name] had a palpable pulse at the wrist.  Kayla Part, MD [facility] DATE OF DICTATION:   09/03/2023

## 2023-09-03 NOTE — H&P (Signed)
 H&P    Reason for Consult: Clotted left-sided AV graft Requesting Physician: Nephrology MRN #:  [id number]  History of Present Illness: This is a 89 y.o. male with prior history of left-sided AV graft creation by my partner Dr. Fulton Job in January of this year.  He underwent balloon venoplasty earlier this month due to graft thrombosis.  He presents today with graft thrombosis for fistulogram and intervention.  On exam, [name] was doing well.  He was in an accident yesterday, and therefore was unable to make his appointment.  He was rescheduled for today.  Overall seems to be doing well.  Past Medical History:  Diagnosis Date   Allergy     Anemia    CAD (coronary artery disease)    pci to LAD 10/09; stable CAD by cath 05/31/08; myoview 04/11/11- no ishcemia, EF 67%; echo 05/15/09- EF>55%, mod calcification of the aortic valve leaflets   CAD S/P percutaneous coronary angioplasty 05/08/2008   LAD PCI with DES 2009- Myoview low risk 2013   CHF (congestive heart failure) (HCC) 09/07/2019   Chronic back pain    Chronic kidney disease    Cystitis, radiation 12/01/2013   Dyspnea    ED (erectile dysfunction)    Elevated serum creatinine 12/14/2015   Essential hypertension, benign    Hematuria 12/01/2013   History of blood transfusion    Hyperlipidemia    Ileus (HCC) 12/14/2015   Insulin  dependent type 2 diabetes mellitus, controlled (HCC)    Malignant neoplasm of prostate (HCC) 11/26/2013   Multiple rib fractures    left 9th, 10th, and 11th posterior rib fractures S/P fall 12/09/2015/notes 12/12/2015   Osteoarthritis of right knee 03/04/2018   Personal history of COVID-19 09/05/2021   PREMATURE VENTRICULAR CONTRACTIONS 05/08/2008   Qualifier: Diagnosis of  By: Nunzio Belch, MD, James     Prostate cancer [facility])    s/p   Rectal bleeding 12/23/2012   Evaluated by Dr Nickey Barn GI patient with history of radiation proctitis. Patient due for colonoscopy on 04/2014    Type II or unspecified type diabetes mellitus  without mention of complication, not stated as uncontrolled    Ventricular tachycardia (HCC)    resuscitated; monitor 05/2008; attempted T ablation 2/10- aborted due to inappropriate substrate    Past Surgical History:  Procedure Laterality Date   A/V SHUNT INTERVENTION [name] 08/08/2023   Procedure: A/V SHUNT INTERVENTION;  Surgeon: Melodie Spry, MD;  Location: [facility] INVASIVE CV LAB;  Service: Cardiovascular;  Laterality: [name];   AV FISTULA PLACEMENT Left 12/02/2022   Procedure: LEFT ARM BRACHIOCEPHALIC ARTERIOVENOUS (AV) FISTULA CREATION;  Surgeon: Young Hensen, MD;  Location: [facility] OR;  Service: Vascular;  Laterality: Left;   AV FISTULA PLACEMENT Left 01/22/2023   Procedure: LEFT ARM BRACHIOBASILISC ARTERIOVENOUS (AV) FISTULA CREATION;  Surgeon: Young Hensen, MD;  Location: [facility] OR;  Service: Vascular;  Laterality: Left;   BASCILIC VEIN TRANSPOSITION Left 05/02/2023   Procedure: Insertion of LEFT Arm Arterio-venous Gortex Graft;  Surgeon: Young Hensen, MD;  Location: [facility] OR;  Service: Vascular;  Laterality: Left;   CARDIAC CATHETERIZATION  05/31/08   EF 55-60%, stable lesions, medical therapy   COLECTOMY  '80's   polyps   CORONARY ANGIOPLASTY WITH STENT PLACEMENT  01/19/08   PCI stent to mid LAD with Promus DES 27.5x28   CYSTOSCOPY WITH DIRECT VISION INTERNAL URETHROTOMY [name] 03/12/2021   Procedure: cystoscoopy withdirect vision internal urethrotomy optilum urethral dilation     ;  Surgeon: Samson Croak, MD;  Location: [facility] ORS;  Service: Urology;  Laterality: [name];  RERQUESTING 45 MINS   DIALYSIS/PERMA CATHETER INSERTION [name] 04/10/2023   Procedure: DIALYSIS/PERMA CATHETER INSERTION;  Surgeon: Melodie Spry, MD;  Location: [facility] INVASIVE CV LAB;  Service: Cardiovascular;  Laterality: [name];   IR GENERIC HISTORICAL  01/03/2016   IR THORACENTESIS ASP PLEURAL SPACE W/IMG GUIDE 01/03/2016 [facility]-INTERV RAD   KNEE ARTHROPLASTY Right 03/04/2018   Procedure: COMPUTER ASSISTED TOTAL KNEE ARTHROPLASTY;   Surgeon: Adonica Hoose, MD;  Location: [facility] ORS;  Service: Orthopedics;  Laterality: Right;   PTCA  01/2008   TRANSURETHRAL RESECTION OF BLADDER TUMOR [name] 11/10/2020   Procedure: Sheron Dixons;  Surgeon: Marco Severs, MD;  Location: [facility] ORS;  Service: Urology;  Laterality: [name];   TRANSURETHRAL RESECTION OF BLADDER TUMOR [name] 08/12/2022   Procedure: TRANSURETHRAL RESECTION OF BLADDER TUMOR (TURBT);  Surgeon: Samson Croak, MD;  Location: [facility] ORS;  Service: Urology;  Laterality: [name];   TRANSURETHRAL RESECTION OF BLADDER TUMOR WITH MITOMYCIN -C [name] 09/17/2021   Procedure: TRANSURETHRAL RESECTION OF BLADDER TUMOR WITH GEMCITABINE ;  Surgeon: Samson Croak, MD;  Location: [facility] ORS;  Service: Urology;  Laterality: [name];    No Known Allergies  Prior to Admission medications   Medication Sig Start Date End Date Taking? Authorizing Provider  acetaminophen  (TYLENOL ) 500 MG tablet Take 1,000 mg by mouth every 8 (eight) hours as needed for moderate pain.    [provider]  amLODipine  (NORVASC ) 10 MG tablet TAKE 1 TABLET EVERY DAY (APPOINTMENT IS NEEDED FOR REFILLS) 05/21/23   Avanell Leigh, MD  atorvastatin  (LIPITOR) 10 MG tablet TAKE 1 TABLET EVERY DAY 05/06/22   Avanell Leigh, MD  B Complex-C-Folic Acid (DIALYVITE 800) 0.8 MG TABS Take 1 tablet by mouth daily. 06/12/23   [provider]  Blood Glucose Monitoring Suppl (ACCU-CHEK AVIVA PLUS) w/Device KIT Test blood sugar 3 times daily. Dx code: E11.22 04/03/16   Benjiman Bras, MD  Blood Pressure Monitoring (BLOOD PRESS MONITOR/M-L CUFF) MISC 1 application by Does not apply route daily. 04/26/20   Benjiman Bras, MD  calcitRIOL  (ROCALTROL ) 0.25 MCG capsule Take 0.25 mcg by mouth Every Tuesday,Thursday,and Saturday with dialysis.    [provider]  carvedilol  (COREG ) 25 MG tablet TAKE 1 TABLET TWICE DAILY. KEEP OFFICE VISIT 01/16/23   Avanell Leigh, MD  Cholecalciferol  (VITAMIN D -3  PO) Take 1 tablet by mouth daily.    [provider]  Continuous Blood Gluc Sensor (FREESTYLE LIBRE SENSOR SYSTEM) MISC 1 application by Does not apply route daily. 06/30/20   Benjiman Bras, MD  Continuous Glucose Monitor Sup KIT 1 kit by Does not apply route as directed. 04/26/20   Benjiman Bras, MD  glucose blood (ACCU-CHEK AVIVA PLUS) test strip Test blood sugar 3 times daily. Dx code: E11.22 04/03/16   Benjiman Bras, MD  insulin  NPH-regular Human (NOVOLIN  70/30) (70-30) 100 UNIT/ML injection Inject 8 Units into the skin 2 (two) times daily with a meal. Patient taking differently: Inject 11 Units into the skin 2 (two) times daily with a meal. 02/10/23   Arrien, Curlee Doss, MD  Insulin  Syringe-Needle U-100 (B-D INS SYR HALF-UNIT .3CC/31G) 31G X 5/16" 0.3 ML MISC Use daily at bedtime to inject insulin . Dx code: 250.00 06/21/13   Benjiman Bras, MD  isosorbide  mononitrate (IMDUR ) 60 MG 24 hr tablet TAKE 2 TABLETS EVERY DAY 05/21/23   Avanell Leigh, MD  Lancets (ACCU-CHEK MULTICLIX) lancets Test blood  sugar 3 times daily. Dx code: E11.22 04/03/16   Benjiman Bras, MD  lidocaine  (LIDODERM ) 5 % Place 1 patch onto the skin daily. Remove & Discard patch within 12 hours or as directed by MD 09/02/23   Iva Mariner, MD  methocarbamol  (ROBAXIN ) 500 MG tablet Take 1 tablet (500 mg total) by mouth every 8 (eight) hours as needed for muscle spasms. 09/02/23   Iva Mariner, MD  oxyCODONE  (ROXICODONE ) 5 MG immediate release tablet Take 1 tablet (5 mg total) by mouth every 4 (four) hours as needed for severe pain (pain score 7-10). Patient not taking: Reported on 08/07/2023 05/02/23   Deneise Finlay P, PA-C  oxyCODONE -acetaminophen  (PERCOCET/ROXICET) 5-325 MG tablet Take 1 tablet by mouth every 8 (eight) hours as needed for up to 3 days for severe pain (pain score 7-10). 09/02/23 09/05/23  Iva Mariner, MD  Polyethyl Glycol-Propyl Glycol (SYSTANE) 0.4-0.3 % SOLN Place 1 drop into both eyes as  needed (dry eyes).    [provider]  tamsulosin  (FLOMAX ) 0.4 MG CAPS capsule Take 0.4 mg by mouth daily. 01/29/21   [provider]  torsemide  (DEMADEX ) 20 MG tablet Take 2 tablets (40 mg total) by mouth 2 (two) times daily. In case of weight gain 2 to 3 lbs in 24 hrs or 5 lbs in 7 days, take one extra tablet a day in the morning, until weight back to baseline. Patient taking differently: Take 20 mg by mouth 2 (two) times daily. In case of weight gain 2 to 3 lbs in 24 hrs or 5 lbs in 7 days, take one extra tablet a day in the morning, until weight back to baseline. 02/10/23   Arrien, Curlee Doss, MD    Social History   Socioeconomic History   Marital status: Married    Spouse name: Not on file   Number of children: 7   Years of education: Not on file   Highest education level: Not on file  Occupational History   Occupation: RETIRED    Employer: RETIRED  Tobacco Use   Smoking status: Former    Current packs/day: 0.00    Types: Cigarettes    Quit date: 05/10/2002    Years since quitting: 21.3   Smokeless tobacco: Never  Vaping Use   Vaping status: Never Used  Substance and Sexual Activity   Alcohol  use: No   Drug use: Yes    Types: Marijuana    Comment: Last use was on 01/20/23.  Informed to withhold 1-2 days prior to procedure.   Sexual activity: Not Currently  Other Topics Concern   Not on file  Social History Narrative   Not on file   Social Drivers of Health   Financial Resource Strain: Not on file  Food Insecurity: No Food Insecurity (02/08/2023)   Hunger Vital Sign    Worried About Running Out of Food in the Last Year: Never true    Ran Out of Food in the Last Year: Never true  Transportation Needs: No Transportation Needs (02/08/2023)   PRAPARE - Administrator, Civil Service (Medical): No    Lack of Transportation (Non-Medical): No  Physical Activity: Not on file  Stress: Not on file  Social Connections: Unknown (10/02/2022)    Received from [facility]   Social Network    Social Network: Not on file  Intimate Partner Violence: Not At Risk (02/08/2023)   Humiliation, Afraid, Rape, and Kick questionnaire    Fear of Current or Ex-Partner:  No    Emotionally Abused: No    Physically Abused: No    Sexually Abused: No   Family History  Problem Relation Age of Onset   Diabetes Mother    Heart attack Mother    Leukemia Father    Heart disease Sister    Heart disease Brother    Diabetes Brother        pancreatic cancer    ROS: Otherwise negative unless mentioned in HPI  Physical Examination  Vitals:   09/03/23 1307  BP: (!) 178/80  Pulse: 71  Resp: 12  Temp: 98.1 F (36.7 C)  SpO2: 97%   There is no height or weight on file to calculate BMI.  General:  WDWN in NAD Gait: Not observed HENT: WNL, normocephalic Pulmonary: normal non-labored breathing, without Rales, rhonchi,  wheezing Cardiac: regular Abdomen:  soft, NT/ND, no masses Skin: without rashes Vascular Exam/Pulses: No thrill in the left arm fistula Extremities: without ischemic changes, without Gangrene , without cellulitis; without open wounds;  Musculoskeletal: no muscle wasting or atrophy  Neurologic: A&O X 3;  No focal weakness or paresthesias are detected; speech is fluent/normal Psychiatric:  The pt has Normal affect. Lymph:  Unremarkable  CBC    Component Value Date/Time   WBC 5.8 09/02/2023 1647   RBC 4.22 09/02/2023 1647   HGB 11.6 (L) 09/02/2023 1647   HGB 12.1 (L) 07/12/2019 1142   HCT 36.4 (L) 09/02/2023 1647   HCT 35.8 (L) 07/12/2019 1142   PLT 239 09/02/2023 1647   PLT 226 07/12/2019 1142   MCV 86.3 09/02/2023 1647   MCV 79 07/12/2019 1142   MCH 27.5 09/02/2023 1647   MCHC 31.9 09/02/2023 1647   RDW 15.5 09/02/2023 1647   RDW 15.2 07/12/2019 1142   LYMPHSABS 1.7 09/02/2023 1647   LYMPHSABS 1.1 07/12/2019 1142   MONOABS 0.6 09/02/2023 1647   EOSABS 0.3 09/02/2023 1647   EOSABS 0.2 07/12/2019  1142   BASOSABS 0.0 09/02/2023 1647   BASOSABS 0.0 07/12/2019 1142    BMET    Component Value Date/Time   NA 138 09/02/2023 1647   NA 142 08/09/2022 0945   K 4.8 09/02/2023 1647   CL 98 09/02/2023 1647   CO2 28 09/02/2023 1647   GLUCOSE 106 (H) 09/02/2023 1647   BUN 44 (H) 09/02/2023 1647   BUN 52 (H) 08/09/2022 0945   CREATININE 8.95 (H) 09/02/2023 1647   CREATININE 1.27 (H) 01/30/2016 0856   CALCIUM  9.2 09/02/2023 1647   GFRNONAA 5 (L) 09/02/2023 1647   GFRNONAA 50 (L) 08/30/2015 0805   GFRAA 35 (L) 10/07/2019 1113   GFRAA 58 (L) 08/30/2015 0805    COAGS: Lab Results  Component Value Date   INR 1.0 06/11/2018   INR 0.94 07/13/2009   INR 1.0 05/30/2008     ASSESSMENT/PLAN: This is a 89 y.o. male with occluded left arm AV graft.  Previous interventions include left balloon venoplasty of the outflow.  Plan today will be fistulogram with thrombectomy, possible venoplasty versus stenting.  After discussing risk and benefits, Josiah Nigh elected to proceed.  I evaluated Dr. Kirk Peper. Patel's last on her images, as well as Dr. Spurgeon Dyer AV graft creation note.   Kayla Part MD MS [facility] [phone] 09/03/2023  1:19 PM

## 2023-09-04 ENCOUNTER — Ambulatory Visit (HOSPITAL_COMMUNITY)

## 2023-09-04 ENCOUNTER — Telehealth: Payer: Self-pay | Admitting: Vascular Surgery

## 2023-09-04 ENCOUNTER — Encounter (HOSPITAL_COMMUNITY): Payer: Self-pay | Admitting: Vascular Surgery

## 2023-09-04 ENCOUNTER — Telehealth: Payer: Self-pay

## 2023-09-04 NOTE — Progress Notes (Deleted)
 Patient ID: [name], male   DOB: [birth year], 89 y.o.   MRN: [id number]  Reason for Consult: No chief complaint on file.   Referred by Lennon Race, MD  Subjective:  HPI [name] is a 89 y.o. male who presents for evaluation HD access creation. Recently underwent declot of his left brachial axillary AV graft on 09/03/2023 with Dr. Christia Cowboy.  It is now rethrombosed.  Past Medical History: No date: Allergy  No date: Anemia No date: CAD (coronary artery disease)     Comment:  pci to LAD 10/09; stable CAD by cath 05/31/08; myoview               04/11/11- no ishcemia, EF 67%; echo 05/15/09- EF>55%, mod               calcification of the aortic valve leaflets 05/08/2008: CAD S/P percutaneous coronary angioplasty     Comment:  LAD PCI with DES 2009- Myoview low risk 2013 09/07/2019: CHF (congestive heart failure) (HCC) No date: Chronic back pain No date: Chronic kidney disease 12/01/2013: Cystitis, radiation No date: Dyspnea No date: ED (erectile dysfunction) 12/14/2015: Elevated serum creatinine No date: Essential hypertension, benign 12/01/2013: Hematuria No date: History of blood transfusion No date: Hyperlipidemia 12/14/2015: Ileus (HCC) No date: Insulin  dependent type 2 diabetes mellitus, controlled (HCC) 11/26/2013: Malignant neoplasm of prostate (HCC) No date: Multiple rib fractures     Comment:  left 9th, 10th, and 11th posterior rib fractures S/P               fall 12/09/2015/notes 12/12/2015 03/04/2018: Osteoarthritis of right knee 09/05/2021: Personal history of COVID-19 05/08/2008: PREMATURE VENTRICULAR CONTRACTIONS     Comment:  Qualifier: Diagnosis of  By: Nunzio Belch, MD, James   No date: Prostate cancer [facility])     Comment:  s/p 12/23/2012: Rectal bleeding     Comment:  Evaluated by Dr Nickey Barn GI patient with history of               radiation proctitis. Patient due for colonoscopy on               04/2014  No date: Type II or unspecified type diabetes  mellitus without  mention of complication, not stated as uncontrolled No date: Ventricular tachycardia (HCC)     Comment:  resuscitated; monitor 05/2008; attempted T ablation 2/10-              aborted due to inappropriate substrate  Family History  Problem Relation Age of Onset   Diabetes Mother    Heart attack Mother    Leukemia Father    Heart disease Sister    Heart disease Brother    Diabetes Brother        pancreatic cancer   Past Surgical History:  Procedure Laterality Date   A/V SHUNT INTERVENTION [name] 08/08/2023   Procedure: A/V SHUNT INTERVENTION;  Surgeon: Melodie Spry, MD;  Location: [facility] INVASIVE CV LAB;  Service: Cardiovascular;  Laterality: [name];   A/V SHUNT INTERVENTION [name] 09/03/2023   Procedure: A/V SHUNT INTERVENTION;  Surgeon: Kayla Part, MD;  Location: [facility] PV LAB;  Service: Cardiovascular;  Laterality: [name];   AV FISTULA PLACEMENT Left 12/02/2022   Procedure: LEFT ARM BRACHIOCEPHALIC ARTERIOVENOUS (AV) FISTULA CREATION;  Surgeon: Young Hensen, MD;  Location: [facility] OR;  Service: Vascular;  Laterality: Left;   AV FISTULA PLACEMENT Left 01/22/2023   Procedure: LEFT ARM BRACHIOBASILISC ARTERIOVENOUS (AV) FISTULA CREATION;  Surgeon: Young Hensen, MD;  Location: [facility] OR;  Service: Vascular;  Laterality: Left;   BASCILIC VEIN TRANSPOSITION Left 05/02/2023   Procedure: Insertion of LEFT Arm Arterio-venous Gortex Graft;  Surgeon: Young Hensen, MD;  Location: [facility] OR;  Service: Vascular;  Laterality: Left;   CARDIAC CATHETERIZATION  05/31/08   EF 55-60%, stable lesions, medical therapy   COLECTOMY  '80's   polyps   CORONARY ANGIOPLASTY WITH STENT PLACEMENT  01/19/08   PCI stent to mid LAD with Promus DES 27.5x28   CYSTOSCOPY WITH DIRECT VISION INTERNAL URETHROTOMY [name] 03/12/2021   Procedure: cystoscoopy withdirect vision internal urethrotomy optilum urethral dilation     ;  Surgeon: Samson Croak, MD;  Location: [facility] ORS;  Service: Urology;  Laterality: [name];   RERQUESTING 45 MINS   DIALYSIS/PERMA CATHETER INSERTION [name] 04/10/2023   Procedure: DIALYSIS/PERMA CATHETER INSERTION;  Surgeon: Melodie Spry, MD;  Location: [facility] INVASIVE CV LAB;  Service: Cardiovascular;  Laterality: [name];   IR GENERIC HISTORICAL  01/03/2016   IR THORACENTESIS ASP PLEURAL SPACE W/IMG GUIDE 01/03/2016 [facility]-INTER[facility] RAD   KNEE ARTHROPLASTY Right 03/04/2018   Procedure: COMPUTER ASSISTED TOTAL KNEE ARTHROPLASTY;  Surgeon: Adonica Hoose, MD;  Location: [facility] ORS;  Service: Orthopedics;  Laterality: Right;   PERIPHERAL VASCULAR THROMBECTOMY  09/03/2023   Procedure: PERIPHERAL VASCULAR THROMBECTOMY;  Surgeon: Kayla Part, MD;  Location: [facility];  Service: Cardiovascular;;  avf   PTCA  01/2008   TRANSURETHRAL RESECTION OF BLADDER TUMOR [name] 11/10/2020   Procedure: Sheron Dixons;  Surgeon: Marco Severs, MD;  Location: [facility] ORS;  Service: Urology;  Laterality: [name];   TRANSURETHRAL RESECTION OF BLADDER TUMOR [name] 08/12/2022   Procedure: TRANSURETHRAL RESECTION OF BLADDER TUMOR (TURBT);  Surgeon: Samson Croak, MD;  Location: [facility] ORS;  Service: Urology;  Laterality: [name];   TRANSURETHRAL RESECTION OF BLADDER TUMOR WITH MITOMYCIN -C [name] 09/17/2021   Procedure: TRANSURETHRAL RESECTION OF BLADDER TUMOR WITH GEMCITABINE ;  Surgeon: Samson Croak, MD;  Location: [facility] ORS;  Service: Urology;  Laterality: [name];   VENOUS STENT  09/03/2023   Procedure: VENOUS STENT;  Surgeon: Kayla Part, MD;  Location: [facility];  Service: Cardiovascular;;  avf    Short Social History:  Social History   Tobacco Use   Smoking status: Former    Current packs/day: 0.00    Types: Cigarettes    Quit date: 05/10/2002    Years since quitting: 21.3   Smokeless tobacco: Never  Substance Use Topics   Alcohol  use: No    No Known Allergies  Current Outpatient Medications  Medication Sig Dispense Refill   acetaminophen  (TYLENOL ) 500 MG tablet Take 1,000 mg by mouth every 8 (eight)  hours as needed for moderate pain.     amLODipine  (NORVASC ) 10 MG tablet TAKE 1 TABLET EVERY DAY (APPOINTMENT IS NEEDED FOR REFILLS) 90 tablet 1   atorvastatin  (LIPITOR) 10 MG tablet TAKE 1 TABLET EVERY DAY 90 tablet 3   B Complex-C-Folic Acid (DIALYVITE 800) 0.8 MG TABS Take 1 tablet by mouth daily.     Blood Glucose Monitoring Suppl (ACCU-CHEK AVIVA PLUS) w/Device KIT Test blood sugar 3 times daily. Dx code: E11.22 1 kit 0   Blood Pressure Monitoring (BLOOD PRESS MONITOR/M-L CUFF) MISC 1 application by Does not apply route daily. 1 each 0   calcitRIOL  (ROCALTROL ) 0.25 MCG capsule Take 0.25 mcg by mouth Every Tuesday,Thursday,and Saturday with dialysis.     carvedilol  (COREG ) 25 MG tablet TAKE 1 TABLET TWICE DAILY. KEEP  OFFICE VISIT 180 tablet 3   Cholecalciferol  (VITAMIN D -3 PO) Take 1 tablet by mouth daily.     Continuous Blood Gluc Sensor (FREESTYLE LIBRE SENSOR SYSTEM) MISC 1 application by Does not apply route daily. 1 each 2   Continuous Glucose Monitor Sup KIT 1 kit by Does not apply route as directed. 1 kit 0   glucose blood (ACCU-CHEK AVIVA PLUS) test strip Test blood sugar 3 times daily. Dx code: E11.22 300 each 3   insulin  NPH-regular Human (NOVOLIN  70/30) (70-30) 100 UNIT/ML injection Inject 8 Units into the skin 2 (two) times daily with a meal. (Patient taking differently: Inject 11 Units into the skin 2 (two) times daily with a meal.)     Insulin  Syringe-Needle U-100 (B-D INS SYR HALF-UNIT .3CC/31G) 31G X 5/16" 0.3 ML MISC Use daily at bedtime to inject insulin . Dx code: 250.00 100 each 3   isosorbide  mononitrate (IMDUR ) 60 MG 24 hr tablet TAKE 2 TABLETS EVERY DAY 180 tablet 1   Lancets (ACCU-CHEK MULTICLIX) lancets Test blood sugar 3 times daily. Dx code: E11.22 300 each 3   lidocaine  (LIDODERM ) 5 % Place 1 patch onto the skin daily. Remove & Discard patch within 12 hours or as directed by MD 5 patch 0   methocarbamol  (ROBAXIN ) 500 MG tablet Take 1 tablet (500 mg total) by mouth  every 8 (eight) hours as needed for muscle spasms. 12 tablet 0   oxyCODONE  (ROXICODONE ) 5 MG immediate release tablet Take 1 tablet (5 mg total) by mouth every 4 (four) hours as needed for severe pain (pain score 7-10). (Patient not taking: Reported on 08/07/2023) 8 tablet 0   oxyCODONE -acetaminophen  (PERCOCET/ROXICET) 5-325 MG tablet Take 1 tablet by mouth every 8 (eight) hours as needed for up to 3 days for severe pain (pain score 7-10). 9 tablet 0   Polyethyl Glycol-Propyl Glycol (SYSTANE) 0.4-0.3 % SOLN Place 1 drop into both eyes as needed (dry eyes).     tamsulosin  (FLOMAX ) 0.4 MG CAPS capsule Take 0.4 mg by mouth daily.     torsemide  (DEMADEX ) 20 MG tablet Take 2 tablets (40 mg total) by mouth 2 (two) times daily. In case of weight gain 2 to 3 lbs in 24 hrs or 5 lbs in 7 days, take one extra tablet a day in the morning, until weight back to baseline. (Patient taking differently: Take 20 mg by mouth 2 (two) times daily. In case of weight gain 2 to 3 lbs in 24 hrs or 5 lbs in 7 days, take one extra tablet a day in the morning, until weight back to baseline.) 80 tablet 0   No current facility-administered medications for this visit.    REVIEW OF SYSTEMS  All other systems were reviewed and are negative  Objective:  Objective   There were no vitals filed for this visit. There is no height or weight on file to calculate BMI.  Physical Exam General: no acute distress Cardiac: hemodynamically stable Pulm: normal work of breathing Neuro: alert, no focal deficit Extremities: Left arm AVG without palpable thrill or pulse Vascular:   Right: palpable brachial, radial  Left: palpable brachial, radial   Data: UE arterial duplex ***   Vein mapping +-----------------+-------------+----------+--------+  Right Cephalic   Diameter (cm)Depth (cm)Findings  +-----------------+-------------+----------+--------+  Shoulder            0.10                          +-----------------+-------------+----------+--------+  Prox upper arm       0.14                         +-----------------+-------------+----------+--------+  Mid upper arm        0.16                         +-----------------+-------------+----------+--------+  Dist upper arm       0.07                         +-----------------+-------------+----------+--------+  Antecubital fossa    0.08                         +-----------------+-------------+----------+--------+  Prox forearm         0.14                         +-----------------+-------------+----------+--------+   +-----------------+-------------+----------+---------+  Right Basilic    Diameter (cm)Depth (cm)Findings   +-----------------+-------------+----------+---------+  Mid upper arm        0.41                          +-----------------+-------------+----------+---------+  Dist upper arm       0.38                          +-----------------+-------------+----------+---------+  Antecubital fossa    0.21               branching  +-----------------+-------------+----------+---------+  Prox forearm         0.17                          +-----------------+-------------+----------+---------+  Mid forearm          0.14                          +-----------------+-------------+----------+---------+   +-----------------+-------------+----------+--------+  Left Cephalic    Diameter (cm)Depth (cm)Findings  +-----------------+-------------+----------+--------+  Shoulder            0.35                         +-----------------+-------------+----------+--------+  Prox upper arm       0.27                         +-----------------+-------------+----------+--------+  Mid upper arm        0.28                         +-----------------+-------------+----------+--------+  Dist upper arm       0.32                          +-----------------+-------------+----------+--------+  Antecubital fossa    0.32                         +-----------------+-------------+----------+--------+  Prox forearm         0.44                         +-----------------+-------------+----------+--------+  Mid forearm          0.28                         +-----------------+-------------+----------+--------+  Dist forearm         0.22                         +-----------------+-------------+----------+--------+   +-----------------+-------------+----------+--------+  Left Basilic     Diameter (cm)Depth (cm)Findings  +-----------------+-------------+----------+--------+  Mid upper arm        0.35                         +-----------------+-------------+----------+--------+  Dist upper arm       0.32                         +-----------------+-------------+----------+--------+  Antecubital fossa    0.26                         +-----------------+-------------+----------+--------+  Prox forearm         0.18                         +-----------------+-------------+----------+--------+  Mid forearm          0.10                         +-----------------+-------------+----------+--------+    Note from nephrologist reviewed ***      Assessment/Plan:     [name] is a 89 y.o. male with {CKDACcess:31998} who presents to discuss permanent access creation.  Dominant hand: {RIGHT/LEFT:20294} Previous access surgeries: *** Previous catheters: {RIGHT/LEFT:21944} IJ Currently dialyzing via *** on *** Other arm surgeries/injuries: ***  The vein mapping suggests there is *** a suitable *** and we discussed that they are a candidate for ***  The risks an benefits including of access creation were reviewed including: need for additional procedures, need for additional creations, steal, ischemia monomelic neuropathy, failure of access, and bleeding. The patient expressed  understand and is willing to proceed.  I explained that I will perform intraoperative vein mapping to confirm the above findings and will determine the most appropriate access to create but with a preoperative plan for {RIGHT/LEFT:20294} arm ***.     Philipp Brawn MD [facility]

## 2023-09-04 NOTE — Telephone Encounter (Addendum)
 Triage: -s/p fistulagram 05/28.  Received message that pt arrived at clinic for HD and unable to dialyze with nurse reporting no pulse. -attempted to call pt.  called spoke to daughter who states she understands that it's clotted off again.  Daughter to call back if pt unable to make appt-discussed pt should be setting up to have access placed which is the priority.   -Spoke to dialysis center who anticipated another fistulagram today & explained the MD recommended vein mapping and new consultation for access was needed.   -update: appts canceled in anticipation of catheter placement.  Spouse notified.

## 2023-09-05 ENCOUNTER — Ambulatory Visit: Admitting: Vascular Surgery

## 2023-09-05 ENCOUNTER — Ambulatory Visit (HOSPITAL_COMMUNITY)
Admission: RE | Admit: 2023-09-05 | Discharge: 2023-09-05 | Disposition: A | Discharge status: Home / Self Care | Attending: Surgery | Admitting: Surgery

## 2023-09-05 ENCOUNTER — Encounter (HOSPITAL_COMMUNITY): Payer: Self-pay | Admitting: Surgery

## 2023-09-05 ENCOUNTER — Other Ambulatory Visit: Payer: Self-pay

## 2023-09-05 ENCOUNTER — Ambulatory Visit (HOSPITAL_COMMUNITY): Attending: Cardiology

## 2023-09-05 ENCOUNTER — Encounter (HOSPITAL_COMMUNITY)
Admission: RE | Disposition: A | Discharge status: Home / Self Care | Payer: Self-pay | Source: Home / Self Care | Attending: Surgery

## 2023-09-05 ENCOUNTER — Encounter (HOSPITAL_COMMUNITY): Payer: Self-pay | Admitting: Cardiovascular Disease

## 2023-09-05 DIAGNOSIS — Z79899 Other long term (current) drug therapy: Secondary | ICD-10-CM | POA: Insufficient documentation

## 2023-09-05 DIAGNOSIS — I509 Heart failure, unspecified: Secondary | ICD-10-CM | POA: Diagnosis not present

## 2023-09-05 DIAGNOSIS — I132 Hypertensive heart and chronic kidney disease with heart failure and with stage 5 chronic kidney disease, or end stage renal disease: Secondary | ICD-10-CM | POA: Diagnosis not present

## 2023-09-05 DIAGNOSIS — I2510 Atherosclerotic heart disease of native coronary artery without angina pectoris: Secondary | ICD-10-CM | POA: Diagnosis not present

## 2023-09-05 DIAGNOSIS — N186 End stage renal disease: Secondary | ICD-10-CM

## 2023-09-05 DIAGNOSIS — T82868A Thrombosis of vascular prosthetic devices, implants and grafts, initial encounter: Secondary | ICD-10-CM | POA: Diagnosis present

## 2023-09-05 DIAGNOSIS — Z992 Dependence on renal dialysis: Secondary | ICD-10-CM

## 2023-09-05 DIAGNOSIS — E7800 Pure hypercholesterolemia, unspecified: Secondary | ICD-10-CM | POA: Insufficient documentation

## 2023-09-05 DIAGNOSIS — Y832 Surgical operation with anastomosis, bypass or graft as the cause of abnormal reaction of the patient, or of later complication, without mention of misadventure at the time of the procedure: Secondary | ICD-10-CM | POA: Diagnosis not present

## 2023-09-05 DIAGNOSIS — Z794 Long term (current) use of insulin: Secondary | ICD-10-CM | POA: Insufficient documentation

## 2023-09-05 DIAGNOSIS — E1122 Type 2 diabetes mellitus with diabetic chronic kidney disease: Secondary | ICD-10-CM | POA: Diagnosis not present

## 2023-09-05 DIAGNOSIS — Z955 Presence of coronary angioplasty implant and graft: Secondary | ICD-10-CM | POA: Insufficient documentation

## 2023-09-05 DIAGNOSIS — Z87891 Personal history of nicotine dependence: Secondary | ICD-10-CM | POA: Insufficient documentation

## 2023-09-05 DIAGNOSIS — T82898A Other specified complication of vascular prosthetic devices, implants and grafts, initial encounter: Secondary | ICD-10-CM

## 2023-09-05 HISTORY — PX: DIALYSIS/PERMA CATHETER INSERTION: CATH118288

## 2023-09-05 LAB — GLUCOSE, CAPILLARY: Glucose-Capillary: 98 mg/dL (ref 70–99)

## 2023-09-05 SURGERY — DIALYSIS/PERMA CATHETER INSERTION
Anesthesia: LOCAL

## 2023-09-05 MED ORDER — LIDOCAINE HCL (PF) 1 % IJ SOLN
INTRAMUSCULAR | Status: AC
  Filled 2023-09-05: qty 30

## 2023-09-05 MED ORDER — HEPARIN SODIUM (PORCINE) 1000 UNIT/ML IJ SOLN
INTRAMUSCULAR | Status: AC
Start: 2023-09-05 — End: ?
  Filled 2023-09-05: qty 10

## 2023-09-05 MED ORDER — LIDOCAINE HCL (PF) 1 % IJ SOLN
INTRAMUSCULAR | Status: DC | PRN
  Administered 2023-09-05: 15 mL via INTRADERMAL

## 2023-09-05 MED ORDER — MIDAZOLAM HCL 2 MG/2ML IJ SOLN
INTRAMUSCULAR | Status: AC
  Filled 2023-09-05: qty 2

## 2023-09-05 MED ORDER — HEPARIN SODIUM (PORCINE) 1000 UNIT/ML IJ SOLN
INTRAMUSCULAR | Status: DC | PRN
  Administered 2023-09-05: 4000 [IU] via INTRAVENOUS

## 2023-09-05 MED ORDER — MIDAZOLAM HCL 2 MG/2ML IJ SOLN
INTRAMUSCULAR | Status: DC | PRN
  Administered 2023-09-05: 2 mg via INTRAVENOUS

## 2023-09-05 MED ORDER — HEPARIN (PORCINE) IN NACL 1000-0.9 UT/500ML-% IV SOLN
INTRAVENOUS | Status: DC | PRN
  Administered 2023-09-05: 500 mL

## 2023-09-05 MED FILL — Lidocaine HCl Local Preservative Free (PF) Inj 1%: INTRAMUSCULAR | Qty: 60 | Status: AC

## 2023-09-05 SURGICAL SUPPLY — 6 items
CATH PALINDROME-P 23 W/VT (CATHETERS) IMPLANT
KIT MICROPUNCTURE NIT STIFF (SHEATH) IMPLANT
KIT PV (KITS) ×1 IMPLANT
SHEATH PROBE COVER 6X72 (BAG) IMPLANT
TRAY PV CATH (CUSTOM PROCEDURE TRAY) ×1 IMPLANT
WIRE BENTSON .035X145CM (WIRE) IMPLANT

## 2023-09-05 NOTE — H&P (View-Only) (Signed)
 [facility]  Patient name: [name] MRN: [id number] DOB: 1933/06/19 Sex: male   REASON FOR VISIT:    Occluded dialysis graft  HISOTRY OF PRESENT ILLNESS:    [name] is a 89 y.o. male with history of left arm AV graft created earlier this year.  He underwent thrombectomy about a month ago and the graft reoccluded.  2 days ago he underwent intervention and repeat thrombectomy with stenting.  Unfortunately the graft has reoccluded.  He is here today for catheter as he has not had any dialysis for about a week.     The patient suffers from coronary artery disease.  He is status post PCI.  He has a history of congestive heart failure.  He is medically managed for type 2 diabetes and hypertension.  He takes a statin for hypercholesterolemia.   PAST MEDICAL HISTORY:   Past Medical History:  Diagnosis Date   Allergy     Anemia    CAD (coronary artery disease)    pci to LAD 10/09; stable CAD by cath 05/31/08; myoview 04/11/11- no ishcemia, EF 67%; echo 05/15/09- EF>55%, mod calcification of the aortic valve leaflets   CAD S/P percutaneous coronary angioplasty 05/08/2008   LAD PCI with DES 2009- Myoview low risk 2013   CHF (congestive heart failure) (HCC) 09/07/2019   Chronic back pain    Chronic kidney disease    Cystitis, radiation 12/01/2013   Dyspnea    ED (erectile dysfunction)    Elevated serum creatinine 12/14/2015   Essential hypertension, benign    Hematuria 12/01/2013   History of blood transfusion    Hyperlipidemia    Ileus (HCC) 12/14/2015   Insulin  dependent type 2 diabetes mellitus, controlled (HCC)    Malignant neoplasm of prostate (HCC) 11/26/2013   Multiple rib fractures    left 9th, 10th, and 11th posterior rib fractures S/P fall 12/09/2015/notes 12/12/2015   Osteoarthritis of right knee 03/04/2018   Personal history of COVID-19 09/05/2021   PREMATURE VENTRICULAR CONTRACTIONS 05/08/2008    Qualifier: Diagnosis of  By: Nunzio Belch, MD, James     Prostate cancer [facility])    s/p   Rectal bleeding 12/23/2012   Evaluated by Dr Nickey Barn GI patient with history of radiation proctitis. Patient due for colonoscopy on 04/2014    Type II or unspecified type diabetes mellitus without mention of complication, not stated as uncontrolled    Ventricular tachycardia (HCC)    resuscitated; monitor 05/2008; attempted T ablation 2/10- aborted due to inappropriate substrate     FAMILY HISTORY:   Family History  Problem Relation Age of Onset   Diabetes Mother    Heart attack Mother    Leukemia Father    Heart disease Sister    Heart disease Brother    Diabetes Brother        pancreatic cancer    SOCIAL HISTORY:   Social History   Tobacco Use   Smoking status: Former    Current packs/day: 0.00    Types: Cigarettes    Quit date: 05/10/2002    Years since quitting: 21.3   Smokeless tobacco: Never  Substance Use Topics   Alcohol  use: No     ALLERGIES:   No Known Allergies   CURRENT MEDICATIONS:   No current facility-administered medications for this encounter.   Current Outpatient Medications  Medication Sig Dispense Refill   acetaminophen  (TYLENOL ) 500 MG tablet Take 1,000 mg by mouth every 8 (eight) hours as needed for moderate  pain.     amLODipine  (NORVASC ) 10 MG tablet TAKE 1 TABLET EVERY DAY (APPOINTMENT IS NEEDED FOR REFILLS) 90 tablet 1   atorvastatin  (LIPITOR) 10 MG tablet TAKE 1 TABLET EVERY DAY 90 tablet 3   B Complex-C-Folic Acid (DIALYVITE 800) 0.8 MG TABS Take 1 tablet by mouth daily.     Blood Glucose Monitoring Suppl (ACCU-CHEK AVIVA PLUS) w/Device KIT Test blood sugar 3 times daily. Dx code: E11.22 1 kit 0   Blood Pressure Monitoring (BLOOD PRESS MONITOR/M-L CUFF) MISC 1 application by Does not apply route daily. 1 each 0   calcitRIOL  (ROCALTROL ) 0.25 MCG capsule Take 0.25 mcg by mouth Every Tuesday,Thursday,and Saturday with dialysis.     carvedilol  (COREG ) 25 MG  tablet TAKE 1 TABLET TWICE DAILY. KEEP OFFICE VISIT 180 tablet 3   Cholecalciferol  (VITAMIN D -3 PO) Take 1 tablet by mouth daily.     Continuous Blood Gluc Sensor (FREESTYLE LIBRE SENSOR SYSTEM) MISC 1 application by Does not apply route daily. 1 each 2   Continuous Glucose Monitor Sup KIT 1 kit by Does not apply route as directed. 1 kit 0   glucose blood (ACCU-CHEK AVIVA PLUS) test strip Test blood sugar 3 times daily. Dx code: E11.22 300 each 3   insulin  NPH-regular Human (NOVOLIN  70/30) (70-30) 100 UNIT/ML injection Inject 8 Units into the skin 2 (two) times daily with a meal. (Patient taking differently: Inject 11 Units into the skin 2 (two) times daily with a meal.)     Insulin  Syringe-Needle U-100 (B-D INS SYR HALF-UNIT .3CC/31G) 31G X 5/16" 0.3 ML MISC Use daily at bedtime to inject insulin . Dx code: 250.00 100 each 3   isosorbide  mononitrate (IMDUR ) 60 MG 24 hr tablet TAKE 2 TABLETS EVERY DAY 180 tablet 1   Lancets (ACCU-CHEK MULTICLIX) lancets Test blood sugar 3 times daily. Dx code: E11.22 300 each 3   methocarbamol  (ROBAXIN ) 500 MG tablet Take 1 tablet (500 mg total) by mouth every 8 (eight) hours as needed for muscle spasms. 12 tablet 0   oxyCODONE  (ROXICODONE ) 5 MG immediate release tablet Take 1 tablet (5 mg total) by mouth every 4 (four) hours as needed for severe pain (pain score 7-10). 8 tablet 0   Polyethyl Glycol-Propyl Glycol (SYSTANE) 0.4-0.3 % SOLN Place 1 drop into both eyes as needed (dry eyes).     tamsulosin  (FLOMAX ) 0.4 MG CAPS capsule Take 0.4 mg by mouth daily.     torsemide  (DEMADEX ) 20 MG tablet Take 2 tablets (40 mg total) by mouth 2 (two) times daily. In case of weight gain 2 to 3 lbs in 24 hrs or 5 lbs in 7 days, take one extra tablet a day in the morning, until weight back to baseline. (Patient taking differently: Take 20 mg by mouth 2 (two) times daily. In case of weight gain 2 to 3 lbs in 24 hrs or 5 lbs in 7 days, take one extra tablet a day in the morning, until  weight back to baseline.) 80 tablet 0   lidocaine  (LIDODERM ) 5 % Place 1 patch onto the skin daily. Remove & Discard patch within 12 hours or as directed by MD 5 patch 0   oxyCODONE -acetaminophen  (PERCOCET/ROXICET) 5-325 MG tablet Take 1 tablet by mouth every 8 (eight) hours as needed for up to 3 days for severe pain (pain score 7-10). 9 tablet 0    REVIEW OF SYSTEMS:   [X]  denotes positive finding, [ ]  denotes negative finding Cardiac  Comments:  Chest pain or  chest pressure:    Shortness of breath upon exertion:    Short of breath when lying flat:    Irregular heart rhythm:        Vascular    Pain in calf, thigh, or hip brought on by ambulation:    Pain in feet at night that wakes you up from your sleep:     Blood clot in your veins:    Leg swelling:         Pulmonary    Oxygen at home:    Productive cough:     Wheezing:         Neurologic    Sudden weakness in arms or legs:     Sudden numbness in arms or legs:     Sudden onset of difficulty speaking or slurred speech:    Temporary loss of vision in one eye:     Problems with dizziness:         Gastrointestinal    Blood in stool:     Vomited blood:         Genitourinary    Burning when urinating:     Blood in urine:        Psychiatric    Major depression:         Hematologic    Bleeding problems:    Problems with blood clotting too easily:        Skin    Rashes or ulcers:        Constitutional    Fever or chills:      PHYSICAL EXAM:   Vitals:   09/05/23 1320 09/05/23 1322 09/05/23 1324 09/05/23 1330  BP: (!) 159/71 (!) 155/75  (!) 163/71  Pulse: 74 79 (!) 0 74  Resp:    18  Temp:      SpO2: 100% 100%  100%    GENERAL: The patient is a well-nourished male, in no acute distress. The vital signs are documented above. CARDIAC: There is a regular rate and rhythm.  VASCULAR: Occluded left arm graft.  Palpable radial pulse on the right PULMONARY: Non-labored respirations ABDOMEN: Soft and non-tender  with normal pitched bowel sounds.  MUSCULOSKELETAL: There are no major deformities or cyanosis. NEUROLOGIC: No focal weakness or paresthesias are detected. SKIN: There are no ulcers or rashes noted. PSYCHIATRIC: The patient has a normal affect.  STUDIES:   ESRD  MEDICAL ISSUES:   ESRD: I discussed that he has had 2 attempts at thrombectomy in the last month of his left arm graft and so I do not think that this is salvageable.  He will need new access.  However most importantly now he needs a catheter so that he can get dialysis.  I discussed placing a left or right internal jugular vein tunneled catheter.  The patient is going to need new access in his right arm.  This will either be a right sided fistula or graft.  This will be scheduled in the operating room in the near future on a nondialysis day.    Marti Slates, MD, FACS [facility] [phone] Pager [phone]

## 2023-09-05 NOTE — Op Note (Signed)
    Patient name: [name] MRN: [id number] DOB: [birth year] Sex: male  09/05/2023 Pre-operative Diagnosis: ESRD Post-operative diagnosis:  Same Surgeon:  Gareld June Procedure:   #1: Ultrasound-guided access, right internal jugular vein   #2: Placement of 23 cm tunneled dialysis catheter using fluoroscopic guidance   Indications: This is an 89 year old gentleman with end-stage renal disease.  He has undergone 2 unsuccessful thrombectomies within the past month.  He now comes in for catheter and discussions regarding new access  Procedure:  The patient was identified in the holding area and taken to [facility] PV LAB 1  The patient was then placed supine on the table.  The patient was prepped and draped in the usual sterile fashion.  A time out was called.  Ultrasound was used to evaluate the right internal jugular vein which was widely patent and easily compressible.  1% lidocaine  was used for local anesthesia.  A #11 blade was used to make a skin nick in the right neck.  A micropuncture needle was used to cannulate the right internal jugular vein under ultrasound guidance.  An 018 wire was inserted without resistance followed by placement of a micropuncture sheath.  2 mg of Versed  were given through the sheath.  A Bentson wire was then inserted.  Dilators from the kit were used to dilate the subcutaneous track.  The peel-away sheath was then inserted under fluoroscopic guidance.  Next, a exit site was selected below the clavicle on the right.  This area was anesthetized with 1% lidocaine  as well as the anticipated tunnel.  The tunnel from the catheter kit was then used to create a tunnel between the 2 incisions.  A 23 cm dialysis catheter was brought through the tunnel and inserted through the peel-away sheath which was removed.  The catheter tip was situated at the cavoatrial junction.  There were no kinks within the catheter.  Both ports flushed and aspirated without difficulty.  The catheter was  sutured into position with 3-0 nylon.  The neck incision was closed with 4-0 Monocryl and Dermabond.  Patient was taken the recovery in stable condition.  There were no immediate complications.   Disposition:    #1: Successful placement of right internal jugular vein 23 cm tunneled dialysis catheter #2:  Catheter is ready for use #3: Patient will be scheduled for a right arm access in the near future on a nondialysis day  V. Gareld June, M.D., [facility] of [place] Office: [phone] Pager:  [phone]

## 2023-09-05 NOTE — H&P (Signed)
 Vascular and Vein Specialist of [facility]  Patient name: [name] MRN: [id number] DOB: 1933/06/19 Sex: male   REASON FOR VISIT:    Occluded dialysis graft  HISOTRY OF PRESENT ILLNESS:    [name] is a 89 y.o. male with history of left arm AV graft created earlier this year.  He underwent thrombectomy about a month ago and the graft reoccluded.  2 days ago he underwent intervention and repeat thrombectomy with stenting.  Unfortunately the graft has reoccluded.  He is here today for catheter as he has not had any dialysis for about a week.     The patient suffers from coronary artery disease.  He is status post PCI.  He has a history of congestive heart failure.  He is medically managed for type 2 diabetes and hypertension.  He takes a statin for hypercholesterolemia.   PAST MEDICAL HISTORY:   Past Medical History:  Diagnosis Date   Allergy     Anemia    CAD (coronary artery disease)    pci to LAD 10/09; stable CAD by cath 05/31/08; myoview 04/11/11- no ishcemia, EF 67%; echo 05/15/09- EF>55%, mod calcification of the aortic valve leaflets   CAD S/P percutaneous coronary angioplasty 05/08/2008   LAD PCI with DES 2009- Myoview low risk 2013   CHF (congestive heart failure) (HCC) 09/07/2019   Chronic back pain    Chronic kidney disease    Cystitis, radiation 12/01/2013   Dyspnea    ED (erectile dysfunction)    Elevated serum creatinine 12/14/2015   Essential hypertension, benign    Hematuria 12/01/2013   History of blood transfusion    Hyperlipidemia    Ileus (HCC) 12/14/2015   Insulin  dependent type 2 diabetes mellitus, controlled (HCC)    Malignant neoplasm of prostate (HCC) 11/26/2013   Multiple rib fractures    left 9th, 10th, and 11th posterior rib fractures S/P fall 12/09/2015/notes 12/12/2015   Osteoarthritis of right knee 03/04/2018   Personal history of COVID-19 09/05/2021   PREMATURE VENTRICULAR CONTRACTIONS 05/08/2008    Qualifier: Diagnosis of  By: Nunzio Belch, MD, James     Prostate cancer [facility])    s/p   Rectal bleeding 12/23/2012   Evaluated by Dr Nickey Barn GI patient with history of radiation proctitis. Patient due for colonoscopy on 04/2014    Type II or unspecified type diabetes mellitus without mention of complication, not stated as uncontrolled    Ventricular tachycardia (HCC)    resuscitated; monitor 05/2008; attempted T ablation 2/10- aborted due to inappropriate substrate     FAMILY HISTORY:   Family History  Problem Relation Age of Onset   Diabetes Mother    Heart attack Mother    Leukemia Father    Heart disease Sister    Heart disease Brother    Diabetes Brother        pancreatic cancer    SOCIAL HISTORY:   Social History   Tobacco Use   Smoking status: Former    Current packs/day: 0.00    Types: Cigarettes    Quit date: 05/10/2002    Years since quitting: 21.3   Smokeless tobacco: Never  Substance Use Topics   Alcohol  use: No     ALLERGIES:   No Known Allergies   CURRENT MEDICATIONS:   No current facility-administered medications for this encounter.   Current Outpatient Medications  Medication Sig Dispense Refill   acetaminophen  (TYLENOL ) 500 MG tablet Take 1,000 mg by mouth every 8 (eight) hours as needed for moderate  pain.     amLODipine  (NORVASC ) 10 MG tablet TAKE 1 TABLET EVERY DAY (APPOINTMENT IS NEEDED FOR REFILLS) 90 tablet 1   atorvastatin  (LIPITOR) 10 MG tablet TAKE 1 TABLET EVERY DAY 90 tablet 3   B Complex-C-Folic Acid (DIALYVITE 800) 0.8 MG TABS Take 1 tablet by mouth daily.     Blood Glucose Monitoring Suppl (ACCU-CHEK AVIVA PLUS) w/Device KIT Test blood sugar 3 times daily. Dx code: E11.22 1 kit 0   Blood Pressure Monitoring (BLOOD PRESS MONITOR/M-L CUFF) MISC 1 application by Does not apply route daily. 1 each 0   calcitRIOL  (ROCALTROL ) 0.25 MCG capsule Take 0.25 mcg by mouth Every Tuesday,Thursday,and Saturday with dialysis.     carvedilol  (COREG ) 25 MG  tablet TAKE 1 TABLET TWICE DAILY. KEEP OFFICE VISIT 180 tablet 3   Cholecalciferol  (VITAMIN D -3 PO) Take 1 tablet by mouth daily.     Continuous Blood Gluc Sensor (FREESTYLE LIBRE SENSOR SYSTEM) MISC 1 application by Does not apply route daily. 1 each 2   Continuous Glucose Monitor Sup KIT 1 kit by Does not apply route as directed. 1 kit 0   glucose blood (ACCU-CHEK AVIVA PLUS) test strip Test blood sugar 3 times daily. Dx code: E11.22 300 each 3   insulin  NPH-regular Human (NOVOLIN  70/30) (70-30) 100 UNIT/ML injection Inject 8 Units into the skin 2 (two) times daily with a meal. (Patient taking differently: Inject 11 Units into the skin 2 (two) times daily with a meal.)     Insulin  Syringe-Needle U-100 (B-D INS SYR HALF-UNIT .3CC/31G) 31G X 5/16" 0.3 ML MISC Use daily at bedtime to inject insulin . Dx code: 250.00 100 each 3   isosorbide  mononitrate (IMDUR ) 60 MG 24 hr tablet TAKE 2 TABLETS EVERY DAY 180 tablet 1   Lancets (ACCU-CHEK MULTICLIX) lancets Test blood sugar 3 times daily. Dx code: E11.22 300 each 3   methocarbamol  (ROBAXIN ) 500 MG tablet Take 1 tablet (500 mg total) by mouth every 8 (eight) hours as needed for muscle spasms. 12 tablet 0   oxyCODONE  (ROXICODONE ) 5 MG immediate release tablet Take 1 tablet (5 mg total) by mouth every 4 (four) hours as needed for severe pain (pain score 7-10). 8 tablet 0   Polyethyl Glycol-Propyl Glycol (SYSTANE) 0.4-0.3 % SOLN Place 1 drop into both eyes as needed (dry eyes).     tamsulosin  (FLOMAX ) 0.4 MG CAPS capsule Take 0.4 mg by mouth daily.     torsemide  (DEMADEX ) 20 MG tablet Take 2 tablets (40 mg total) by mouth 2 (two) times daily. In case of weight gain 2 to 3 lbs in 24 hrs or 5 lbs in 7 days, take one extra tablet a day in the morning, until weight back to baseline. (Patient taking differently: Take 20 mg by mouth 2 (two) times daily. In case of weight gain 2 to 3 lbs in 24 hrs or 5 lbs in 7 days, take one extra tablet a day in the morning, until  weight back to baseline.) 80 tablet 0   lidocaine  (LIDODERM ) 5 % Place 1 patch onto the skin daily. Remove & Discard patch within 12 hours or as directed by MD 5 patch 0   oxyCODONE -acetaminophen  (PERCOCET/ROXICET) 5-325 MG tablet Take 1 tablet by mouth every 8 (eight) hours as needed for up to 3 days for severe pain (pain score 7-10). 9 tablet 0    REVIEW OF SYSTEMS:   [X]  denotes positive finding, [ ]  denotes negative finding Cardiac  Comments:  Chest pain or  chest pressure:    Shortness of breath upon exertion:    Short of breath when lying flat:    Irregular heart rhythm:        Vascular    Pain in calf, thigh, or hip brought on by ambulation:    Pain in feet at night that wakes you up from your sleep:     Blood clot in your veins:    Leg swelling:         Pulmonary    Oxygen at home:    Productive cough:     Wheezing:         Neurologic    Sudden weakness in arms or legs:     Sudden numbness in arms or legs:     Sudden onset of difficulty speaking or slurred speech:    Temporary loss of vision in one eye:     Problems with dizziness:         Gastrointestinal    Blood in stool:     Vomited blood:         Genitourinary    Burning when urinating:     Blood in urine:        Psychiatric    Major depression:         Hematologic    Bleeding problems:    Problems with blood clotting too easily:        Skin    Rashes or ulcers:        Constitutional    Fever or chills:      PHYSICAL EXAM:   Vitals:   09/05/23 1320 09/05/23 1322 09/05/23 1324 09/05/23 1330  BP: (!) 159/71 (!) 155/75  (!) 163/71  Pulse: 74 79 (!) 0 74  Resp:    18  Temp:      SpO2: 100% 100%  100%    GENERAL: The patient is a well-nourished male, in no acute distress. The vital signs are documented above. CARDIAC: There is a regular rate and rhythm.  VASCULAR: Occluded left arm graft.  Palpable radial pulse on the right PULMONARY: Non-labored respirations ABDOMEN: Soft and non-tender  with normal pitched bowel sounds.  MUSCULOSKELETAL: There are no major deformities or cyanosis. NEUROLOGIC: No focal weakness or paresthesias are detected. SKIN: There are no ulcers or rashes noted. PSYCHIATRIC: The patient has a normal affect.  STUDIES:   ESRD  MEDICAL ISSUES:   ESRD: I discussed that he has had 2 attempts at thrombectomy in the last month of his left arm graft and so I do not think that this is salvageable.  He will need new access.  However most importantly now he needs a catheter so that he can get dialysis.  I discussed placing a left or right internal jugular vein tunneled catheter.  The patient is going to need new access in his right arm.  This will either be a right sided fistula or graft.  This will be scheduled in the operating room in the near future on a nondialysis day.    Marti Slates, MD, FACS [facility] of [facility] [phone] Pager [phone]

## 2023-09-08 ENCOUNTER — Encounter (HOSPITAL_COMMUNITY): Payer: Self-pay | Admitting: Surgery

## 2023-09-12 NOTE — Telephone Encounter (Signed)
 Patient appointment scheduled

## 2023-09-22 ENCOUNTER — Encounter (HOSPITAL_COMMUNITY): Payer: Self-pay | Admitting: Surgery

## 2023-09-22 ENCOUNTER — Other Ambulatory Visit: Payer: Self-pay

## 2023-09-22 NOTE — Progress Notes (Signed)
 PCP - Dr. Arlys Lamer Cardiologist - Dr. Lauro Portal EP- Dr. Jolly Needle  PPM/ICD - denies  Chest x-ray - 02/08/23 EKG - 02/08/23 Stress Test - 04/11/11 ECHO - 02/09/23 Cardiac Cath - 05/31/08  CPAP - denies  Fasting Blood Sugar - 100-110 Checks Blood Sugar once/day  ASA/Blood Thinner Instructions: n/a   ERAS Protcol - no, NPO  COVID TEST- n/a  Anesthesia review: yes, cardiac hx  Patient verbally denies any shortness of breath, fever, cough and chest pain during phone call     Questions were answered. Patient verbalized understanding of instructions.

## 2023-09-22 NOTE — Pre-Procedure Instructions (Signed)
 -------------  SDW INSTRUCTIONS given:  Your procedure is scheduled on 6/18.  Report to [facility] Main Entrance A at 09:30 A.M., and check in at the Admitting office.  Any questions or running late day of surgery: call [phone]    Remember:  Do not eat or drink after midnight the night before your surgery     Take these medicines the morning of surgery with A SIP OF WATER   amlodipine , lipitor, coreg , imdur , flomax          May take these medicines IF NEEDED: tylenol , robaxin , oxycodone , systane   As of today, STOP taking any Aspirin  (unless otherwise instructed by your surgeon) Aleve , Naproxen , Ibuprofen, Motrin, Advil, Goody's, BC's, all herbal medications, fish oil, and all vitamins.  WHAT DO I DO ABOUT MY DIABETES MEDICATION?    THE NIGHT BEFORE SURGERY, take 8 units  insulin  NPH-regular Human (NOVOLIN  70/30)       THE MORNING OF SURGERY, do NOT take insulin  NPH-regular Human (NOVOLIN  70/30)   HOW TO MANAGE YOUR DIABETES BEFORE AND AFTER SURGERY  Why is it important to control my blood sugar before and after surgery? Improving blood sugar levels before and after surgery helps healing and can limit problems. A way of improving blood sugar control is eating a healthy diet by:  Eating less sugar and carbohydrates  Increasing activity/exercise  Talking with your doctor about reaching your blood sugar goals High blood sugars (greater than 180 mg/dL) can raise your risk of infections and slow your recovery, so you will need to focus on controlling your diabetes during the weeks before surgery. Make sure that the doctor who takes care of your diabetes knows about your planned surgery including the date and location.  How do I manage my blood sugar before surgery? Check your blood sugar at least 4 times a day, starting 2 days before surgery, to make sure that the level is not too high or low.  Check your blood sugar the morning of your surgery when you wake up and every 2  hours until you get to the Short Stay unit.  If your blood sugar is less than 70 mg/dL, you will need to treat for low blood sugar: Do not take insulin . Treat a low blood sugar (less than 70 mg/dL) with  cup of clear juice (cranberry or apple), 4 glucose tablets, OR glucose gel. Recheck blood sugar in 15 minutes after treatment (to make sure it is greater than 70 mg/dL). If your blood sugar is not greater than 70 mg/dL on recheck, call [phone] for further instructions. Report your blood sugar to the short stay nurse when you get to Short Stay.  If you are admitted to the hospital after surgery: Your blood sugar will be checked by the staff and you will probably be given insulin  after surgery (instead of oral diabetes medicines) to make sure you have good blood sugar levels. The goal for blood sugar control after surgery is 80-180 mg/dL.   Do NOT Smoke (Tobacco/Vaping) 24 hours prior to your procedure  If you use a CPAP at night, you may bring all equipment for your overnight stay.     You will be asked to remove any contacts, glasses, piercing's, hearing aid's, dentures/partials prior to surgery. Please bring cases for these items if needed.     Patients discharged the day of surgery will not be allowed to drive home, and someone needs to stay with them for 24 hours.  SURGICAL WAITING ROOM VISITATION Patients may  have no more than 2 support people in the waiting area - these visitors may rotate.   Pre-op  nurse will coordinate an appropriate time for 1 ADULT support person, who may not rotate, to accompany patient in pre-op .  Children under the age of 26 must have an adult with them who is not the patient and must remain in the main waiting area with an adult.  If the patient needs to stay at the hospital during part of their recovery, the visitor guidelines for inpatient rooms apply.  Please refer to the [facility] website for the visitor guidelines for any additional  information.   Special instructions:   [facility]- Preparing For Surgery   Please follow these instructions carefully.   Shower the NIGHT BEFORE SURGERY and the MORNING OF SURGERY with DIAL  Soap.   Pat yourself dry with a CLEAN TOWEL.  Wear CLEAN PAJAMAS to bed the night before surgery  Place CLEAN SHEETS on your bed the night of your first shower and DO NOT SLEEP WITH PETS.   Additional instructions for the day of surgery: DO NOT APPLY any lotions, deodorants, cologne, or perfumes.   Do not wear jewelry or makeup Do not wear nail polish, gel polish, artificial nails, or any other type of covering on natural nails (fingers and toes) Do not bring valuables to the hospital. [facility] is not responsible for valuables/personal belongings. Put on clean/comfortable clothes.  Please brush your teeth.  Ask your nurse before applying any prescription medications to the skin.

## 2023-09-23 NOTE — Anesthesia Preprocedure Evaluation (Addendum)
 Anesthesia Evaluation  Patient identified by MRN, date of birth, ID band Patient awake    Reviewed: Allergy  & Precautions, NPO status , Patient's Chart, lab work & pertinent test results  Airway Mallampati: II  TM Distance: >3 FB Neck ROM: Full    Dental no notable dental hx.    Pulmonary former smoker   Pulmonary exam normal        Cardiovascular hypertension, Pt. on medications and Pt. on home beta blockers + CAD, + Cardiac Stents and +CHF   Rhythm:Regular Rate:Normal     Neuro/Psych negative neurological ROS  negative psych ROS   GI/Hepatic negative GI ROS, Neg liver ROS,,,  Endo/Other  diabetes, Type 2, Insulin  Dependent    Renal/GU ESRF and DialysisRenal disease  negative genitourinary   Musculoskeletal  (+) Arthritis , Osteoarthritis,    Abdominal Normal abdominal exam  (+)   Peds  Hematology  (+) Blood dyscrasia, anemia Lab Results      Component                Value               Date                      WBC                      5.8                 09/02/2023                HGB                      11.6 (L)            09/02/2023                HCT                      36.4 (L)            09/02/2023                MCV                      86.3                09/02/2023                PLT                      239                 09/02/2023              Anesthesia Other Findings   Reproductive/Obstetrics                             Anesthesia Physical Anesthesia Plan  ASA: 3  Anesthesia Plan: MAC and Regional   Post-op Pain Management: Regional block*   Induction: Intravenous  PONV Risk Score and Plan: 1 and Ondansetron , Dexamethasone  and Treatment may vary due to age or medical condition  Airway Management Planned: Simple Face Mask and Nasal Cannula  Additional Equipment: None  Intra-op Plan:   Post-operative Plan:   Informed Consent: I have reviewed the patients  History and Physical, chart, labs and discussed the  procedure including the risks, benefits and alternatives for the proposed anesthesia with the patient or authorized representative who has indicated his/her understanding and acceptance.     Dental advisory given  Plan Discussed with: CRNA  Anesthesia Plan Comments: (PAT note by Rudy Costain, PA-C:  89 year old male follows with cardiology for history of CAD s/p DES to LAD 2009, diastolic heart failure, moderate aortic stenosis (mean gradient 24 mmHg by echo 02/2023), mild mitral stenosis/regurgitation, pulmonary hypertension (RVSP 53.2 mmHg by echo 02/2023) HTN, HLD, NSVT s/p attempted ablation 2010.   Last seen by Dr. Katheryne Pane 11/06/2022, stable at that time from cardiac standpoint, no changes to management.  Echo was ordered which was done 02/09/2023 showed EF 7025% with grade 2 DD, normal RV systolic function with moderately elevated PASP, mild mitral regurgitation/stenosis, stable moderate aortic stenosis with mean gradient 24 mmHg (prior gradient 28 mmHg by echo 08/2022).  IDDM2, last A1c 5.7 on 05/29/2023.  ESRD on HD, initiated in January 2025.  He had a left AV graft placed 05/02/2023 which subsequently thrombosed.  Currently dialyzing through right IJ TDC.  Patient will need day of surgery labs and evaluation.  EKG 02/08/2023: Sinus rhythm.  Rate 79.  Nonspecific ST-T changes.  TTE 02/09/2023: 1. Left ventricular ejection fraction, by estimation, is 70 to 75%. The  left ventricle has hyperdynamic function. The left ventricle has no  regional wall motion abnormalities. There is mild concentric left  ventricular hypertrophy. Left ventricular  diastolic parameters are consistent with Grade II diastolic dysfunction  (pseudonormalization). Elevated left atrial pressure.  2. Right ventricular systolic function is normal. The right ventricular  size is normal. There is moderately elevated pulmonary artery systolic  pressure.  3. Left  atrial size was moderately dilated.  4. Right atrial size was moderately dilated.  5. The mitral valve is normal in structure. Mild mitral valve  regurgitation. Mild mitral stenosis.  6. The aortic valve is tricuspid. Aortic valve regurgitation is trivial.  Moderate aortic valve stenosis.  7. Aortic dilatation noted. There is borderline dilatation of the aortic  root, measuring 38 mm.  8. The inferior vena cava is dilated in size with >50% respiratory  variability, suggesting right atrial pressure of 8 mmHg.    )        Anesthesia Quick Evaluation

## 2023-09-23 NOTE — Progress Notes (Signed)
 Anesthesia Chart Review: Same day workup  89 year old male follows with cardiology for history of CAD s/p DES to LAD 2009, diastolic heart failure, moderate aortic stenosis (mean gradient 24 mmHg by echo 02/2023), mild mitral stenosis/regurgitation, pulmonary hypertension (RVSP 53.2 mmHg by echo 02/2023) HTN, HLD, NSVT s/p attempted ablation 2010.   Last seen by Dr. Katheryne Pane 11/06/2022, stable at that time from cardiac standpoint, no changes to management.  Echo was ordered which was done 02/09/2023 showed EF 7025% with grade 2 DD, normal RV systolic function with moderately elevated PASP, mild mitral regurgitation/stenosis, stable moderate aortic stenosis with mean gradient 24 mmHg (prior gradient 28 mmHg by echo 08/2022).   IDDM2, last A1c 5.7 on 05/29/2023.   ESRD on HD, initiated in January 2025.  He had a left AV graft placed 05/02/2023 which subsequently thrombosed.  Currently dialyzing through right IJ TDC.   Patient will need day of surgery labs and evaluation.   EKG 02/08/2023: Sinus rhythm.  Rate 79.  Nonspecific ST-T changes.   TTE 02/09/2023: 1. Left ventricular ejection fraction, by estimation, is 70 to 75%. The  left ventricle has hyperdynamic function. The left ventricle has no  regional wall motion abnormalities. There is mild concentric left  ventricular hypertrophy. Left ventricular  diastolic parameters are consistent with Grade II diastolic dysfunction  (pseudonormalization). Elevated left atrial pressure.   2. Right ventricular systolic function is normal. The right ventricular  size is normal. There is moderately elevated pulmonary artery systolic  pressure.   3. Left atrial size was moderately dilated.   4. Right atrial size was moderately dilated.   5. The mitral valve is normal in structure. Mild mitral valve  regurgitation. Mild mitral stenosis.   6. The aortic valve is tricuspid. Aortic valve regurgitation is trivial.  Moderate aortic valve stenosis.   7. Aortic  dilatation noted. There is borderline dilatation of the aortic  root, measuring 38 mm.   8. The inferior vena cava is dilated in size with >50% respiratory  variability, suggesting right atrial pressure of 8 mmHg.      Edilia Gordon [facility] Short Stay Center/Anesthesiology Phone [phone] 09/23/2023 10:26 AM

## 2023-09-24 ENCOUNTER — Other Ambulatory Visit: Payer: Self-pay

## 2023-09-24 ENCOUNTER — Ambulatory Visit (HOSPITAL_COMMUNITY): Admitting: Physician Assistant

## 2023-09-24 ENCOUNTER — Encounter (HOSPITAL_COMMUNITY)
Admission: RE | Disposition: A | Discharge status: Home / Self Care | Payer: Self-pay | Source: Home / Self Care | Attending: Surgery

## 2023-09-24 ENCOUNTER — Encounter (HOSPITAL_COMMUNITY): Payer: Self-pay | Admitting: Surgery

## 2023-09-24 ENCOUNTER — Ambulatory Visit (HOSPITAL_COMMUNITY)
Admission: RE | Admit: 2023-09-24 | Discharge: 2023-09-24 | Disposition: A | Discharge status: Home / Self Care | Attending: Surgery | Admitting: Surgery

## 2023-09-24 DIAGNOSIS — D631 Anemia in chronic kidney disease: Secondary | ICD-10-CM | POA: Insufficient documentation

## 2023-09-24 DIAGNOSIS — Z794 Long term (current) use of insulin: Secondary | ICD-10-CM | POA: Diagnosis not present

## 2023-09-24 DIAGNOSIS — Z955 Presence of coronary angioplasty implant and graft: Secondary | ICD-10-CM | POA: Diagnosis not present

## 2023-09-24 DIAGNOSIS — M1990 Unspecified osteoarthritis, unspecified site: Secondary | ICD-10-CM | POA: Insufficient documentation

## 2023-09-24 DIAGNOSIS — N186 End stage renal disease: Secondary | ICD-10-CM

## 2023-09-24 DIAGNOSIS — X58XXXA Exposure to other specified factors, initial encounter: Secondary | ICD-10-CM | POA: Insufficient documentation

## 2023-09-24 DIAGNOSIS — I132 Hypertensive heart and chronic kidney disease with heart failure and with stage 5 chronic kidney disease, or end stage renal disease: Secondary | ICD-10-CM | POA: Diagnosis not present

## 2023-09-24 DIAGNOSIS — Z833 Family history of diabetes mellitus: Secondary | ICD-10-CM | POA: Diagnosis not present

## 2023-09-24 DIAGNOSIS — T82868A Thrombosis of vascular prosthetic devices, implants and grafts, initial encounter: Secondary | ICD-10-CM | POA: Insufficient documentation

## 2023-09-24 DIAGNOSIS — I509 Heart failure, unspecified: Secondary | ICD-10-CM | POA: Insufficient documentation

## 2023-09-24 DIAGNOSIS — I2510 Atherosclerotic heart disease of native coronary artery without angina pectoris: Secondary | ICD-10-CM | POA: Insufficient documentation

## 2023-09-24 DIAGNOSIS — Z79899 Other long term (current) drug therapy: Secondary | ICD-10-CM | POA: Insufficient documentation

## 2023-09-24 DIAGNOSIS — E7800 Pure hypercholesterolemia, unspecified: Secondary | ICD-10-CM | POA: Insufficient documentation

## 2023-09-24 DIAGNOSIS — Z992 Dependence on renal dialysis: Secondary | ICD-10-CM

## 2023-09-24 DIAGNOSIS — Z87891 Personal history of nicotine dependence: Secondary | ICD-10-CM

## 2023-09-24 DIAGNOSIS — E1122 Type 2 diabetes mellitus with diabetic chronic kidney disease: Secondary | ICD-10-CM | POA: Insufficient documentation

## 2023-09-24 DIAGNOSIS — T82858A Stenosis of vascular prosthetic devices, implants and grafts, initial encounter: Secondary | ICD-10-CM

## 2023-09-24 DIAGNOSIS — I120 Hypertensive chronic kidney disease with stage 5 chronic kidney disease or end stage renal disease: Secondary | ICD-10-CM

## 2023-09-24 HISTORY — PX: INSERTION OF ARTERIOVENOUS (AV) ARTEGRAFT ARM: SHX6779

## 2023-09-24 LAB — GLUCOSE, CAPILLARY
Glucose-Capillary: 120 mg/dL — ABNORMAL HIGH (ref 70–99)
Glucose-Capillary: 121 mg/dL — ABNORMAL HIGH (ref 70–99)

## 2023-09-24 LAB — POCT I-STAT, CHEM 8
BUN: 41 mg/dL — ABNORMAL HIGH (ref 8–23)
Calcium, Ion: 1.01 mmol/L — ABNORMAL LOW (ref 1.15–1.40)
Chloride: 101 mmol/L (ref 98–111)
Creatinine, Ser: 7.3 mg/dL — ABNORMAL HIGH (ref 0.61–1.24)
Glucose, Bld: 114 mg/dL — ABNORMAL HIGH (ref 70–99)
HCT: 32 % — ABNORMAL LOW (ref 39.0–52.0)
Hemoglobin: 10.9 g/dL — ABNORMAL LOW (ref 13.0–17.0)
Potassium: 5.7 mmol/L — ABNORMAL HIGH (ref 3.5–5.1)
Sodium: 136 mmol/L (ref 135–145)
TCO2: 28 mmol/L (ref 22–32)

## 2023-09-24 SURGERY — INSERTION, GRAFT, ARTERIOVENOUS, UPPER EXTREMITY
Anesthesia: Regional | Laterality: Right

## 2023-09-24 MED ORDER — FENTANYL CITRATE (PF) 100 MCG/2ML IJ SOLN
INTRAMUSCULAR | Status: DC | PRN
  Administered 2023-09-24: 50 ug via INTRAVENOUS
  Administered 2023-09-24: 25 ug via INTRAVENOUS

## 2023-09-24 MED ORDER — MIDAZOLAM HCL 2 MG/2ML IJ SOLN
INTRAMUSCULAR | Status: AC
  Filled 2023-09-24: qty 2

## 2023-09-24 MED ORDER — SODIUM CHLORIDE 0.9% FLUSH: 3.0000 mL | INTRAVENOUS | Status: DC | PRN

## 2023-09-24 MED ORDER — CARVEDILOL 12.5 MG PO TABS
ORAL_TABLET | ORAL | Status: AC
  Filled 2023-09-24: qty 2

## 2023-09-24 MED ORDER — CARVEDILOL 12.5 MG PO TABS
25.0000 mg | ORAL_TABLET | Freq: Once | ORAL | Status: AC
  Administered 2023-09-24: 25 mg via ORAL

## 2023-09-24 MED ORDER — 0.9 % SODIUM CHLORIDE (POUR BTL) OPTIME
TOPICAL | Status: DC | PRN
  Administered 2023-09-24: 1000 mL

## 2023-09-24 MED ORDER — EPHEDRINE SULFATE-NACL 50-0.9 MG/10ML-% IV SOSY
PREFILLED_SYRINGE | INTRAVENOUS | Status: DC | PRN
  Administered 2023-09-24: 5 mg via INTRAVENOUS

## 2023-09-24 MED ORDER — SODIUM CHLORIDE 0.9 % IV SOLN: INTRAVENOUS | Status: DC

## 2023-09-24 MED ORDER — ORAL CARE MOUTH RINSE: 15.0000 mL | Freq: Once | OROMUCOSAL | Status: AC

## 2023-09-24 MED ORDER — PROPOFOL 500 MG/50ML IV EMUL
INTRAVENOUS | Status: DC | PRN
  Administered 2023-09-24: 50 ug/kg/min via INTRAVENOUS

## 2023-09-24 MED ORDER — CHLORHEXIDINE GLUCONATE 4 % EX SOLN: 60.0000 mL | Freq: Once | CUTANEOUS | Status: DC

## 2023-09-24 MED ORDER — HEPARIN SODIUM (PORCINE) 1000 UNIT/ML IJ SOLN
INTRAMUSCULAR | Status: AC
  Filled 2023-09-24: qty 10

## 2023-09-24 MED ORDER — DROPERIDOL 2.5 MG/ML IJ SOLN: 0.6250 mg | Freq: Once | INTRAMUSCULAR | Status: DC | PRN

## 2023-09-24 MED ORDER — FENTANYL CITRATE (PF) 100 MCG/2ML IJ SOLN
INTRAMUSCULAR | Status: AC
  Filled 2023-09-24: qty 2

## 2023-09-24 MED ORDER — PROPOFOL 10 MG/ML IV BOLUS
INTRAVENOUS | Status: DC | PRN
  Administered 2023-09-24: 30 mg via INTRAVENOUS

## 2023-09-24 MED ORDER — LIDOCAINE-EPINEPHRINE (PF) 1.5 %-1:200000 IJ SOLN
INTRAMUSCULAR | Status: DC | PRN
  Administered 2023-09-24: 30 mL via PERINEURAL

## 2023-09-24 MED ORDER — CHLORHEXIDINE GLUCONATE 0.12 % MT SOLN
15.0000 mL | Freq: Once | OROMUCOSAL | Status: AC
  Administered 2023-09-24: 15 mL via OROMUCOSAL
  Filled 2023-09-24: qty 15

## 2023-09-24 MED ORDER — HEPARIN SODIUM (PORCINE) 1000 UNIT/ML IJ SOLN
INTRAMUSCULAR | Status: DC | PRN
Start: 2023-09-24 — End: 2023-09-24
  Administered 2023-09-24: 5000 [IU] via INTRAVENOUS

## 2023-09-24 MED ORDER — OXYCODONE-ACETAMINOPHEN 5-325 MG PO TABS: 1.0000 | ORAL_TABLET | ORAL | 0 refills | Status: DC | PRN

## 2023-09-24 MED ORDER — PHENYLEPHRINE HCL-NACL 20-0.9 MG/250ML-% IV SOLN
INTRAVENOUS | Status: DC | PRN
  Administered 2023-09-24: 30 ug/min via INTRAVENOUS

## 2023-09-24 MED ORDER — LIDOCAINE-EPINEPHRINE (PF) 1 %-1:200000 IJ SOLN
INTRAMUSCULAR | Status: AC
Start: 2023-09-24 — End: 2023-09-24
  Filled 2023-09-24: qty 30

## 2023-09-24 MED ORDER — FENTANYL CITRATE (PF) 100 MCG/2ML IJ SOLN: 25.0000 ug | INTRAMUSCULAR | Status: DC | PRN

## 2023-09-24 MED ORDER — CEFAZOLIN SODIUM-DEXTROSE 2-4 GM/100ML-% IV SOLN
2.0000 g | INTRAVENOUS | Status: AC
  Administered 2023-09-24: 2 g via INTRAVENOUS
  Filled 2023-09-24: qty 100

## 2023-09-24 MED ORDER — HEPARIN 6000 UNIT IRRIGATION SOLUTION
Status: DC | PRN
Start: 2023-09-24 — End: 2023-09-24
  Administered 2023-09-24: 1

## 2023-09-24 MED ORDER — HEPARIN 6000 UNIT IRRIGATION SOLUTION
Status: AC
Start: 2023-09-24 — End: 2023-09-24
  Filled 2023-09-24: qty 500

## 2023-09-24 MED ORDER — INSULIN ASPART 100 UNIT/ML IJ SOLN: 0.0000 [IU] | INTRAMUSCULAR | Status: DC | PRN

## 2023-09-24 MED ORDER — ONDANSETRON HCL 4 MG/2ML IJ SOLN
INTRAMUSCULAR | Status: DC | PRN
  Administered 2023-09-24: 4 mg via INTRAVENOUS

## 2023-09-24 MED ORDER — PROPOFOL 10 MG/ML IV BOLUS
INTRAVENOUS | Status: AC
  Filled 2023-09-24: qty 20

## 2023-09-24 SURGICAL SUPPLY — 29 items
ARMBAND PINK RESTRICT EXTREMIT (MISCELLANEOUS) ×2 IMPLANT
BAG COUNTER SPONGE SURGICOUNT (BAG) ×1 IMPLANT
BENZOIN TINCTURE PRP APPL 2/3 (GAUZE/BANDAGES/DRESSINGS) IMPLANT
CANISTER SUCTION 3000ML PPV (SUCTIONS) ×1 IMPLANT
CLIP TI MEDIUM 6 (CLIP) ×1 IMPLANT
CLIP TI WIDE RED SMALL 6 (CLIP) ×1 IMPLANT
COVER PROBE W GEL 5X96 (DRAPES) ×1 IMPLANT
DERMABOND ADVANCED .7 DNX12 (GAUZE/BANDAGES/DRESSINGS) ×1 IMPLANT
DRSG TEGADERM 4X10 (GAUZE/BANDAGES/DRESSINGS) IMPLANT
ELECTRODE REM PT RTRN 9FT ADLT (ELECTROSURGICAL) ×1 IMPLANT
GAUZE SPONGE 4X4 12PLY STRL LF (GAUZE/BANDAGES/DRESSINGS) IMPLANT
GLOVE SURG SS PI 7.5 STRL IVOR (GLOVE) ×3 IMPLANT
GOWN STRL REUS W/ TWL LRG LVL3 (GOWN DISPOSABLE) ×2 IMPLANT
GOWN STRL REUS W/ TWL XL LVL3 (GOWN DISPOSABLE) ×1 IMPLANT
GRAFT GORETEX STRT 4-7X45 (Vascular Products) IMPLANT
HEMOSTAT SNOW SURGICEL 2X4 (HEMOSTASIS) IMPLANT
KIT BASIN OR (CUSTOM PROCEDURE TRAY) ×1 IMPLANT
KIT TURNOVER KIT B (KITS) ×1 IMPLANT
NS IRRIG 1000ML POUR BTL (IV SOLUTION) ×1 IMPLANT
PACK CV ACCESS (CUSTOM PROCEDURE TRAY) ×1 IMPLANT
PAD ARMBOARD POSITIONER FOAM (MISCELLANEOUS) ×2 IMPLANT
SLING ARM FOAM STRAP LRG (SOFTGOODS) IMPLANT
STRIP SURGICAL 3/4 X 6 IN (GAUZE/BANDAGES/DRESSINGS) IMPLANT
SUT PROLENE 6 0 CC (SUTURE) ×1 IMPLANT
SUT VIC AB 3-0 SH 27X BRD (SUTURE) ×1 IMPLANT
SUT VIC AB 4-0 PS2 18 (SUTURE) ×1 IMPLANT
TOWEL GREEN STERILE (TOWEL DISPOSABLE) ×1 IMPLANT
UNDERPAD 30X36 HEAVY ABSORB (UNDERPADS AND DIAPERS) ×1 IMPLANT
WATER STERILE IRR 1000ML POUR (IV SOLUTION) ×1 IMPLANT

## 2023-09-24 NOTE — Anesthesia Procedure Notes (Signed)
 Anesthesia Regional Block: Supraclavicular block   Pre-Anesthetic Checklist: , timeout performed,  Correct Patient, Correct Site, Correct Laterality,  Correct Procedure, Correct Position, site marked,  Risks and benefits discussed,  Surgical consent,  Pre-op  evaluation,  At surgeon's request and post-op pain management  Laterality: Right  Prep: Dura Prep       Needles:  Injection technique: Single-shot  Needle Type: Echogenic Stimulator Needle     Needle Length: 5cm  Needle Gauge: 20     Additional Needles:   Procedures:,,,, ultrasound used (permanent image in chart),,    Narrative:  Start time: 09/24/2023 10:30 AM End time: 09/24/2023 10:35 AM Injection made incrementally with aspirations every 5 mL.  Performed by: Personally  Anesthesiologist: Micheal Agent, DO  Additional Notes: Patient identified. Risks/Benefits/Options discussed with patient including but not limited to bleeding, infection, nerve damage, failed block, incomplete pain control. Patient expressed understanding and wished to proceed. All questions were answered. Sterile technique was used throughout the entire procedure. Please see nursing notes for vital signs. Aspirated in 5cc intervals with injection for negative confirmation. Patient was given instructions on fall risk and not to get out of bed. All questions and concerns addressed with instructions to call with any issues or inadequate analgesia.

## 2023-09-24 NOTE — Transfer of Care (Signed)
 Immediate Anesthesia Transfer of Care Note  Patient: [name]  Procedure(s) Performed: INSERTION, GRAFT, ARTERIOVENOUS, UPPER EXTREMITY (Right)  Patient Location: PACU  Anesthesia Type:General  Level of Consciousness: awake, alert , and oriented  Airway & Oxygen Therapy: Patient Spontanous Breathing and Patient connected to face mask oxygen  Post-op Assessment: Report given to RN and Post -op Vital signs reviewed and stable  Post vital signs: Reviewed and stable  Last Vitals:  Vitals Value Taken Time  BP 122/53 09/24/23 12:24  Temp    Pulse 65 09/24/23 12:27  Resp 13 09/24/23 12:27  SpO2 100 % 09/24/23 12:27  Vitals shown include unfiled device data.  Last Pain:  Vitals:   09/24/23 1010  TempSrc:   PainSc: 0-No pain         Complications: No notable events documented.

## 2023-09-24 NOTE — Anesthesia Procedure Notes (Addendum)
 Procedure Name: LMA Insertion Date/Time: 09/24/2023 11:17 AM  Performed by: Elius Etheredge A, CRNAPre-anesthesia Checklist: Patient identified, Emergency Drugs available, Suction available and Patient being monitored Patient Re-evaluated:Patient Re-evaluated prior to induction Oxygen Delivery Method: Circle System Utilized Preoxygenation: Pre-oxygenation with 100% oxygen Induction Type: IV induction Ventilation: Mask ventilation without difficulty LMA: LMA inserted LMA Size: 5.0 Number of attempts: 1 Airway Equipment and Method: Bite block Placement Confirmation: positive ETCO2 Tube secured with: Tape Dental Injury: Teeth and Oropharynx as per pre-operative assessment

## 2023-09-24 NOTE — Interval H&P Note (Signed)
 History and Physical Interval Note:  09/24/2023 10:31 AM  [name]  has presented today for surgery, with the diagnosis of ESRD.  The various methods of treatment have been discussed with the patient and family. After consideration of risks, benefits and other options for treatment, the patient has consented to  Procedure(s): ARTERIOVENOUS (AV) FISTULA CREATION (Right) INSERTION, GRAFT, ARTERIOVENOUS, UPPER EXTREMITY (Right) as a surgical intervention.  The patient's history has been reviewed, patient examined, no change in status, stable for surgery.  I have reviewed the patient's chart and labs.  Questions were answered to the patient's satisfaction.     Carlene Che

## 2023-09-24 NOTE — Op Note (Signed)
 DATE OF SERVICE: 09/24/2023  PATIENT:  [name]  89 y.o. male  PRE-OPERATIVE DIAGNOSIS:  ESRD  POST-OPERATIVE DIAGNOSIS:  Same  PROCEDURE:   Right upper arm brachial to axillary arteriovenous graft (CPT [phone])  SURGEON:  Surgeons and Role:    * Carlene Che, MD - Primary  ASSISTANT: Lucious Sabina, CST  ANESTHESIA:   regional and general  EBL: minimal  BLOOD ADMINISTERED:none  DRAINS: none   LOCAL MEDICATIONS USED:  NONE  SPECIMEN:  none  COUNTS: confirmed correct.  TOURNIQUET:  none  PATIENT DISPOSITION:  PACU - hemodynamically stable.   Delay start of Pharmacological VTE agent (>24hrs) due to surgical blood loss or risk of bleeding: no  INDICATION FOR PROCEDURE: [name] is a 89 y.o. male with ESRD in need of permanent dialysis access. After careful discussion of risks, benefits, and alternatives the patient was offered right arm arteriovenous graft creation. The patient understood and wished to proceed.  OPERATIVE FINDINGS: healthy brachial artery. Healthy axillary vein. Good result from graft. Good doppler bruit at completion. Good doppler flow in radial artery at wrist at completion.  DESCRIPTION OF PROCEDURE: After identification of the patient in the pre-operative holding area, the patient was transferred to the operating room. The patient was positioned supine on the operating room table. Anesthesia was induced. The right arm was prepped and draped in standard fashion. A surgical pause was performed confirming correct patient, procedure, and operative location.  The right brachial artery was exposed using a longitudinal incision in the distal arm just above the antecubital fossa.  Incision was carried down through subcutaneous tissue until the brachial sheath was encountered.  This was incised sharply.  The brachial artery was exposed and encircled with Silastic Vesseloops proximally and distally to the site of planned inflow.   The right axillary  vein at the axilla was exposed using longitudinal incision just below the hairbearing area of the axilla.  Incision was carried down until the brachial sheath was encountered.  The brachial vein was identified, exposed, encircled with Silastic Vesseloops.   Using a curved, sheathed tunneling device, a 4-7 mm tapered Gore-Tex graft was tunneled subcutaneously and gentle arc across the biceps of the right arm.  Patient was then heparinized with 5000 units of IV heparin .   The brachial artery was clamped proximally and distally.  An anterior arteriotomy was made with an 11 blade.  This was extended with Potts scissors.  The 4 mm end of the Gore-Tex graft was spatulated and then anastomosed end-to-side to the brachial arteriotomy using continuous running suture of 6-0 Prolene.  The anastomosis was completed and hemostasis ensured.  The graft was clamped to restore perfusion to the hand.   The brachial vein was clamped proximally and distally.  An anterior venotomy was made with an 11 blade.  This was extended with Potts scissors.  The 7 mm end of the Gore-Tex graft was then anastomosed end to side to the brachial vein venotomy using continuous running suture of 6-0 Prolene.  Immediately prior to completion the anastomosis was de-aired and flushed.  Anastomosis was then completed.  Hemostasis was insured.   Doppler machine was brought onto the field to interrogate the graft.  Doppler flow was noted in the radial artery.  About the arterial anastomosis flow was noted proximal and distal to the arterial anastomosis.  Distal to the venous anastomosis a Doppler bruit was heard.  Satisfied we ended the case here.   Surgical beds were irrigated copiously.  Hemostasis was again ensured in the surgical beds.  The wounds were closed in layers using 3-0 Vicryl and 4-0 Monocryl.  Clean bandages were applied.  Upon completion of the case instrument and sharps counts were confirmed correct. The patient was transferred to  the PACU in good condition. I was present for all portions of the procedure.  FOLLOW UP PLAN: follow up as needed  Heber Little. Edgardo Goodwill, MD [facility] Phone Number: [phone] 09/24/2023 12:20 PM

## 2023-09-25 ENCOUNTER — Encounter (HOSPITAL_COMMUNITY): Payer: Self-pay | Admitting: Vascular Surgery

## 2023-09-25 NOTE — Anesthesia Postprocedure Evaluation (Signed)
 Anesthesia Post Note  Patient: [name]  Procedure(s) Performed: INSERTION, GRAFT, ARTERIOVENOUS, UPPER EXTREMITY (Right)     Patient location during evaluation: PACU Anesthesia Type: Regional and MAC Level of consciousness: awake and alert Pain management: pain level controlled Vital Signs Assessment: post-procedure vital signs reviewed and stable Respiratory status: spontaneous breathing, nonlabored ventilation, respiratory function stable and patient connected to nasal cannula oxygen Cardiovascular status: stable and blood pressure returned to baseline Postop Assessment: no apparent nausea or vomiting Anesthetic complications: no   No notable events documented.  Last Vitals:  Vitals:   09/24/23 1345 09/24/23 1400  BP: (!) 121/54 (!) 130/57  Pulse: 65 64  Resp: 12 12  Temp:  36.4 C  SpO2: 95% 95%    Last Pain:  Vitals:   09/24/23 1400  TempSrc:   PainSc: 0-No pain                 Theotis Flake P Tyreck Bell

## 2023-10-16 ENCOUNTER — Other Ambulatory Visit: Payer: Self-pay | Admitting: Cardiovascular Disease

## 2023-10-16 DIAGNOSIS — I10 Essential (primary) hypertension: Secondary | ICD-10-CM

## 2023-10-29 ENCOUNTER — Other Ambulatory Visit (HOSPITAL_COMMUNITY): Payer: Self-pay | Admitting: Nephrology

## 2023-10-29 ENCOUNTER — Telehealth (HOSPITAL_COMMUNITY): Payer: Self-pay

## 2023-10-29 DIAGNOSIS — N186 End stage renal disease: Secondary | ICD-10-CM

## 2023-10-29 NOTE — Telephone Encounter (Signed)
 Called to schedule tdc removal, no answer, left vm. AB

## 2023-11-05 ENCOUNTER — Other Ambulatory Visit: Payer: Self-pay | Admitting: Cardiovascular Disease

## 2023-11-07 ENCOUNTER — Ambulatory Visit (HOSPITAL_COMMUNITY)
Admission: RE | Admit: 2023-11-07 | Discharge: 2023-11-07 | Disposition: A | Discharge status: Home / Self Care | Source: Ambulatory Visit | Attending: Nephrology | Admitting: Nephrology

## 2023-11-07 DIAGNOSIS — N186 End stage renal disease: Secondary | ICD-10-CM | POA: Insufficient documentation

## 2023-11-07 DIAGNOSIS — Z4901 Encounter for fitting and adjustment of extracorporeal dialysis catheter: Secondary | ICD-10-CM | POA: Insufficient documentation

## 2023-11-07 HISTORY — PX: IR REMOVAL TUN CV CATH W/O FL: IMG2289

## 2023-11-07 MED ORDER — LIDOCAINE-EPINEPHRINE 1 %-1:100000 IJ SOLN
INTRAMUSCULAR | Status: AC
  Filled 2023-11-07: qty 1

## 2023-11-07 NOTE — Procedures (Signed)
 Interventional Radiology Procedure Note  PROCEDURE SUMMARY:  Successful removal of tunneled catheter.  No complications.   EBL = trace  Please see full dictation in imaging section of Epic for procedure details.  Electronically Signed: Carlin DELENA Griffon, PA-C 11/07/2023, 12:45 PM

## 2023-11-26 ENCOUNTER — Telehealth: Payer: Self-pay | Admitting: Cardiovascular Disease

## 2023-11-26 DIAGNOSIS — E785 Hyperlipidemia, unspecified: Secondary | ICD-10-CM

## 2023-11-26 MED ORDER — ATORVASTATIN CALCIUM 10 MG PO TABS: 10.0000 mg | ORAL_TABLET | Freq: Every day | ORAL | 0 refills | Status: DC

## 2023-11-26 NOTE — Telephone Encounter (Signed)
 RX sent in

## 2023-11-26 NOTE — Telephone Encounter (Signed)
*  STAT* If patient is at the pharmacy, call can be transferred to refill team.   1. Which medications need to be refilled? (please list name of each medication and dose if known) atorvastatin  (LIPITOR) 10 MG tablet    2. Would you like to learn more about the convenience, safety, & potential cost savings by using the [facility]? No   3. Are you open to using the [facility] (Type [facility] No   4. Which pharmacy/location (including street and city if local pharmacy) is medication to be sent to?  [facility] - [place], [place] - [address]   5. Do they need a 30 day or 90 day supply? 90 day  Pt has scheduled appt on 9/17

## 2023-12-09 ENCOUNTER — Observation Stay (HOSPITAL_COMMUNITY)

## 2023-12-09 ENCOUNTER — Emergency Department (HOSPITAL_BASED_OUTPATIENT_CLINIC_OR_DEPARTMENT_OTHER)

## 2023-12-09 ENCOUNTER — Encounter (HOSPITAL_COMMUNITY): Payer: Self-pay

## 2023-12-09 ENCOUNTER — Other Ambulatory Visit: Payer: Self-pay

## 2023-12-09 ENCOUNTER — Observation Stay (HOSPITAL_COMMUNITY)
Admission: EM | Admit: 2023-12-09 | Discharge: 2023-12-10 | Disposition: A | Discharge status: Home / Self Care | Attending: Infectious Diseases | Admitting: Infectious Diseases

## 2023-12-09 ENCOUNTER — Emergency Department (HOSPITAL_COMMUNITY)

## 2023-12-09 DIAGNOSIS — I5032 Chronic diastolic (congestive) heart failure: Secondary | ICD-10-CM | POA: Diagnosis not present

## 2023-12-09 DIAGNOSIS — I120 Hypertensive chronic kidney disease with stage 5 chronic kidney disease or end stage renal disease: Secondary | ICD-10-CM | POA: Diagnosis not present

## 2023-12-09 DIAGNOSIS — T82868A Thrombosis of vascular prosthetic devices, implants and grafts, initial encounter: Secondary | ICD-10-CM | POA: Diagnosis present

## 2023-12-09 DIAGNOSIS — Z794 Long term (current) use of insulin: Secondary | ICD-10-CM | POA: Insufficient documentation

## 2023-12-09 DIAGNOSIS — E785 Hyperlipidemia, unspecified: Secondary | ICD-10-CM | POA: Insufficient documentation

## 2023-12-09 DIAGNOSIS — N186 End stage renal disease: Secondary | ICD-10-CM

## 2023-12-09 DIAGNOSIS — I132 Hypertensive heart and chronic kidney disease with heart failure and with stage 5 chronic kidney disease, or end stage renal disease: Secondary | ICD-10-CM | POA: Insufficient documentation

## 2023-12-09 DIAGNOSIS — I10 Essential (primary) hypertension: Secondary | ICD-10-CM | POA: Diagnosis present

## 2023-12-09 DIAGNOSIS — Z79899 Other long term (current) drug therapy: Secondary | ICD-10-CM | POA: Diagnosis not present

## 2023-12-09 DIAGNOSIS — E1122 Type 2 diabetes mellitus with diabetic chronic kidney disease: Secondary | ICD-10-CM

## 2023-12-09 DIAGNOSIS — E875 Hyperkalemia: Secondary | ICD-10-CM | POA: Diagnosis not present

## 2023-12-09 DIAGNOSIS — T82511A Breakdown (mechanical) of surgically created arteriovenous shunt, initial encounter: Secondary | ICD-10-CM

## 2023-12-09 DIAGNOSIS — Z992 Dependence on renal dialysis: Secondary | ICD-10-CM

## 2023-12-09 DIAGNOSIS — Z23 Encounter for immunization: Secondary | ICD-10-CM | POA: Insufficient documentation

## 2023-12-09 DIAGNOSIS — Y832 Surgical operation with anastomosis, bypass or graft as the cause of abnormal reaction of the patient, or of later complication, without mention of misadventure at the time of the procedure: Secondary | ICD-10-CM | POA: Diagnosis not present

## 2023-12-09 DIAGNOSIS — E1169 Type 2 diabetes mellitus with other specified complication: Secondary | ICD-10-CM | POA: Diagnosis present

## 2023-12-09 HISTORY — PX: IR TUNNELED CENTRAL VENOUS CATH PLC W IMG: IMG1939

## 2023-12-09 LAB — HEMOGLOBIN A1C
Hgb A1c MFr Bld: 6 % — ABNORMAL HIGH (ref 4.8–5.6)
Mean Plasma Glucose: 125.5 mg/dL

## 2023-12-09 LAB — CBC
HCT: 33.2 % — ABNORMAL LOW (ref 39.0–52.0)
Hemoglobin: 10.3 g/dL — ABNORMAL LOW (ref 13.0–17.0)
MCH: 28.3 pg (ref 26.0–34.0)
MCHC: 31 g/dL (ref 30.0–36.0)
MCV: 91.2 fL (ref 80.0–100.0)
Platelets: 234 K/uL (ref 150–400)
RBC: 3.64 MIL/uL — ABNORMAL LOW (ref 4.22–5.81)
RDW: 16.6 % — ABNORMAL HIGH (ref 11.5–15.5)
WBC: 7.2 K/uL (ref 4.0–10.5)
nRBC: 0 % (ref 0.0–0.2)

## 2023-12-09 LAB — RENAL FUNCTION PANEL
Albumin: 2.8 g/dL — ABNORMAL LOW (ref 3.5–5.0)
Anion gap: 15 (ref 5–15)
BUN: 65 mg/dL — ABNORMAL HIGH (ref 8–23)
CO2: 15 mmol/L — ABNORMAL LOW (ref 22–32)
Calcium: 8.9 mg/dL (ref 8.9–10.3)
Chloride: 108 mmol/L (ref 98–111)
Creatinine, Ser: 11.52 mg/dL — ABNORMAL HIGH (ref 0.61–1.24)
GFR, Estimated: 4 mL/min — ABNORMAL LOW (ref >60)
Glucose, Bld: 105 mg/dL — ABNORMAL HIGH (ref 70–99)
Phosphorus: 5.9 mg/dL — ABNORMAL HIGH (ref 2.5–4.6)
Potassium: 6.3 mmol/L (ref 3.5–5.1)
Sodium: 138 mmol/L (ref 135–145)

## 2023-12-09 LAB — I-STAT CHEM 8, ED
BUN: 56 mg/dL — ABNORMAL HIGH (ref 8–23)
Calcium, Ion: 1.12 mmol/L — ABNORMAL LOW (ref 1.15–1.40)
Chloride: 112 mmol/L — ABNORMAL HIGH (ref 98–111)
Creatinine, Ser: 12.8 mg/dL — ABNORMAL HIGH (ref 0.61–1.24)
Glucose, Bld: 89 mg/dL (ref 70–99)
HCT: 32 % — ABNORMAL LOW (ref 39.0–52.0)
Hemoglobin: 10.9 g/dL — ABNORMAL LOW (ref 13.0–17.0)
Potassium: 5.9 mmol/L — ABNORMAL HIGH (ref 3.5–5.1)
Sodium: 138 mmol/L (ref 135–145)
TCO2: 18 mmol/L — ABNORMAL LOW (ref 22–32)

## 2023-12-09 LAB — MRSA NEXT GEN BY PCR, NASAL: MRSA by PCR Next Gen: NOT DETECTED

## 2023-12-09 LAB — GLUCOSE, CAPILLARY
Glucose-Capillary: 99 mg/dL (ref 70–99)
Glucose-Capillary: 130 mg/dL — ABNORMAL HIGH (ref 70–99)

## 2023-12-09 LAB — TROPONIN I (HIGH SENSITIVITY)
Troponin I (High Sensitivity): 19 ng/L — ABNORMAL HIGH (ref <18)
Troponin I (High Sensitivity): 20 ng/L — ABNORMAL HIGH (ref <18)

## 2023-12-09 LAB — HEPATITIS B SURFACE ANTIGEN: Hepatitis B Surface Ag: NONREACTIVE

## 2023-12-09 MED ORDER — CALCIUM GLUCONATE-NACL 1-0.675 GM/50ML-% IV SOLN
1.0000 g | Freq: Once | INTRAVENOUS | Status: AC
  Administered 2023-12-09: 1000 mg via INTRAVENOUS
  Filled 2023-12-09: qty 50

## 2023-12-09 MED ORDER — TORSEMIDE 20 MG PO TABS
20.0000 mg | ORAL_TABLET | Freq: Every day | ORAL | Status: DC
  Administered 2023-12-10: 20 mg via ORAL
  Filled 2023-12-09: qty 1

## 2023-12-09 MED ORDER — SODIUM ZIRCONIUM CYCLOSILICATE 5 G PO PACK
5.0000 g | PACK | Freq: Once | ORAL | Status: AC
  Administered 2023-12-09: 5 g via ORAL
  Filled 2023-12-09: qty 1

## 2023-12-09 MED ORDER — INFLUENZA VAC SPLIT HIGH-DOSE 0.5 ML IM SUSY
0.5000 mL | PREFILLED_SYRINGE | INTRAMUSCULAR | Status: AC
  Administered 2023-12-10: 0.5 mL via INTRAMUSCULAR
  Filled 2023-12-09 (×3): qty 0.5

## 2023-12-09 MED ORDER — MIDAZOLAM HCL 2 MG/2ML IJ SOLN
INTRAMUSCULAR | Status: AC | PRN
  Administered 2023-12-09 (×2): 1 mg via INTRAVENOUS

## 2023-12-09 MED ORDER — HEPARIN SODIUM (PORCINE) 5000 UNIT/ML IJ SOLN
5000.0000 [IU] | Freq: Three times a day (TID) | INTRAMUSCULAR | Status: DC
  Administered 2023-12-09 – 2023-12-10 (×2): 5000 [IU] via SUBCUTANEOUS
  Filled 2023-12-09 (×2): qty 1

## 2023-12-09 MED ORDER — CARVEDILOL 25 MG PO TABS
25.0000 mg | ORAL_TABLET | Freq: Two times a day (BID) | ORAL | Status: DC
  Administered 2023-12-10: 25 mg via ORAL
  Filled 2023-12-09: qty 2

## 2023-12-09 MED ORDER — AMLODIPINE BESYLATE 10 MG PO TABS
10.0000 mg | ORAL_TABLET | Freq: Every day | ORAL | Status: DC
  Administered 2023-12-10: 10 mg via ORAL
  Filled 2023-12-09: qty 1

## 2023-12-09 MED ORDER — NEPRO/CARBSTEADY PO LIQD: 237.0000 mL | ORAL | Status: DC | PRN

## 2023-12-09 MED ORDER — HEPARIN SODIUM (PORCINE) 1000 UNIT/ML DIALYSIS: 3000.0000 [IU] | Freq: Once | INTRAMUSCULAR | Status: DC

## 2023-12-09 MED ORDER — HEPARIN SODIUM (PORCINE) 1000 UNIT/ML DIALYSIS: 1000.0000 [IU] | INTRAMUSCULAR | Status: DC | PRN

## 2023-12-09 MED ORDER — ANTICOAGULANT SODIUM CITRATE 4% (200MG/5ML) IV SOLN: 5.0000 mL | Status: DC | PRN

## 2023-12-09 MED ORDER — LIDOCAINE-PRILOCAINE 2.5-2.5 % EX CREA: 1.0000 | TOPICAL_CREAM | CUTANEOUS | Status: DC | PRN

## 2023-12-09 MED ORDER — PENTAFLUOROPROP-TETRAFLUOROETH EX AERO: 1.0000 | INHALATION_SPRAY | CUTANEOUS | Status: DC | PRN

## 2023-12-09 MED ORDER — ATORVASTATIN CALCIUM 10 MG PO TABS
10.0000 mg | ORAL_TABLET | Freq: Every day | ORAL | Status: DC
  Administered 2023-12-10: 10 mg via ORAL
  Filled 2023-12-09: qty 1

## 2023-12-09 MED ORDER — CEFAZOLIN SODIUM-DEXTROSE 2-4 GM/100ML-% IV SOLN: 2.0000 g | INTRAVENOUS | Status: AC

## 2023-12-09 MED ORDER — SODIUM ZIRCONIUM CYCLOSILICATE 10 G PO PACK
10.0000 g | PACK | Freq: Once | ORAL | Status: AC
  Administered 2023-12-09: 10 g via ORAL
  Filled 2023-12-09: qty 1

## 2023-12-09 MED ORDER — INSULIN ASPART 100 UNIT/ML IJ SOLN
0.0000 [IU] | Freq: Three times a day (TID) | INTRAMUSCULAR | Status: DC
  Administered 2023-12-10: 2 [IU] via SUBCUTANEOUS

## 2023-12-09 MED ORDER — HEPARIN SODIUM (PORCINE) 1000 UNIT/ML IJ SOLN
INTRAMUSCULAR | Status: AC
  Filled 2023-12-09: qty 10

## 2023-12-09 MED ORDER — ISOSORBIDE MONONITRATE ER 60 MG PO TB24
120.0000 mg | ORAL_TABLET | Freq: Every day | ORAL | Status: DC
  Administered 2023-12-10: 120 mg via ORAL
  Filled 2023-12-09: qty 2

## 2023-12-09 MED ORDER — RENA-VITE PO TABS: 1.0000 | ORAL_TABLET | Freq: Every day | ORAL | Status: DC

## 2023-12-09 MED ORDER — DIAZEPAM 5 MG PO TABS: 10.0000 mg | ORAL_TABLET | Freq: Once | ORAL | Status: DC

## 2023-12-09 MED ORDER — CEFAZOLIN SODIUM-DEXTROSE 2-4 GM/100ML-% IV SOLN
INTRAVENOUS | Status: AC
  Filled 2023-12-09: qty 100

## 2023-12-09 MED ORDER — LIDOCAINE-EPINEPHRINE 1 %-1:100000 IJ SOLN
INTRAMUSCULAR | Status: AC
  Filled 2023-12-09: qty 1

## 2023-12-09 MED ORDER — MIDAZOLAM HCL 2 MG/2ML IJ SOLN
INTRAMUSCULAR | Status: AC
Start: 2023-12-09 — End: 2023-12-09
  Filled 2023-12-09: qty 2

## 2023-12-09 MED ORDER — TAMSULOSIN HCL 0.4 MG PO CAPS
0.4000 mg | ORAL_CAPSULE | Freq: Every day | ORAL | Status: DC
  Administered 2023-12-10: 0.4 mg via ORAL
  Filled 2023-12-09: qty 1

## 2023-12-09 MED ORDER — FENTANYL CITRATE (PF) 100 MCG/2ML IJ SOLN
INTRAMUSCULAR | Status: AC | PRN
  Administered 2023-12-09 (×2): 50 ug via INTRAVENOUS

## 2023-12-09 MED ORDER — ACETAMINOPHEN 500 MG PO TABS
1000.0000 mg | ORAL_TABLET | Freq: Every morning | ORAL | Status: DC
  Administered 2023-12-10: 1000 mg via ORAL
  Filled 2023-12-09: qty 2

## 2023-12-09 MED ORDER — FENTANYL CITRATE (PF) 100 MCG/2ML IJ SOLN
INTRAMUSCULAR | Status: AC
  Filled 2023-12-09: qty 2

## 2023-12-09 MED ORDER — SODIUM ZIRCONIUM CYCLOSILICATE 5 G PO PACK: 5.0000 g | PACK | Freq: Once | ORAL | Status: DC

## 2023-12-09 MED ORDER — CHLORHEXIDINE GLUCONATE CLOTH 2 % EX PADS
6.0000 | MEDICATED_PAD | Freq: Every day | CUTANEOUS | Status: DC
  Administered 2023-12-10: 6 via TOPICAL

## 2023-12-09 MED ORDER — HEPARIN SODIUM (PORCINE) 1000 UNIT/ML IJ SOLN
4000.0000 [IU] | Freq: Once | INTRAMUSCULAR | Status: AC
  Administered 2023-12-10: 4000 [IU]
  Filled 2023-12-09: qty 4

## 2023-12-09 MED ORDER — ALTEPLASE 2 MG IJ SOLR: 2.0000 mg | Freq: Once | INTRAMUSCULAR | Status: DC | PRN

## 2023-12-09 MED ORDER — LIDOCAINE-EPINEPHRINE 1 %-1:100000 IJ SOLN
20.0000 mL | Freq: Once | INTRAMUSCULAR | Status: AC
  Administered 2023-12-09: 20 mL via INTRADERMAL

## 2023-12-09 MED ORDER — LIDOCAINE HCL (PF) 1 % IJ SOLN
5.0000 mL | INTRAMUSCULAR | Status: DC | PRN
Start: 2023-12-09 — End: 2023-12-10

## 2023-12-09 MED ORDER — INSULIN ASPART 100 UNIT/ML IJ SOLN: 0.0000 [IU] | Freq: Every day | INTRAMUSCULAR | Status: DC

## 2023-12-09 NOTE — H&P (Signed)
 Date: 12/09/2023               Patient Name:  [name] MRN: [id number]  DOB: 01/01/1934 Age / Sex: 89 y.o., male   PCP: Teresa Thersia Jansky, MD         Medical Service: Internal Medicine Teaching Service         Attending Physician: Dr. Rosan Dayton BROCKS, DO      First Contact: Dr. Rebecka Pion, DO    Second Contact: Dr. Roetta Chars, MD         After Hours (After 5p/  First Contact Pager: [phone]  weekends / holidays): Second Contact Pager: [phone]   SUBJECTIVE   Chief Complaint: Right AV fistula thrombosis  History of Present Illness:  [name] is an 89 year old male with a past medical history of ESRD on HD TTS, CAD, HFpEF, HTN, and T2DM who presents from dialysis due to inability to access right upper extremity AV fistula.  His last dialysis was on Tuesday 8/26.  He was feeling ill on Thursday and then they were unable to access his AV fistula on Saturday.  After it happened again today he was sent to the ED.  He denies any significant symptoms including dyspnea, chest pain, confusion, itching, or any other associated symptoms.  ED Course: Presented as above hemodynamically stable and hypertensive.  Labs significant for potassium of 5.9.  Vascular was consulted who did not recommend further surgery on his AV fistula so IR was asked to evaluate for tunneled dialysis catheter placement.  Nephrology notified of patient's admission.  Past Medical History ESRD on HD TTS CAD with prior stent HFpEF Hypertension Type 2 diabetes on insulin  Prostate cancer  Meds:  Amlodipine  10 mg daily Atorvastatin  10 mg daily Rena-vite vitamins daily Carvedilol  25 mg twice daily 70/30 NPH insulin  8 units twice daily Isosorbide  mononitrate 60 mg twice daily Tamsulosin  0.4 mg daily Torsemide  20 mg daily  Past Surgical History Past Surgical History:  Procedure Laterality Date   A/V SHUNT INTERVENTION [name] 08/08/2023   Procedure: A/V SHUNT INTERVENTION;  Surgeon: Tobie Gordy POUR,  MD;  [facility] INVASIVE CV LAB;  Service: Cardiovascular;  Laterality: [name];   A/V SHUNT INTERVENTION [name] 09/03/2023   Procedure: A/V SHUNT INTERVENTION;  Surgeon: Lanis Fonda BRAVO, MD;  Location: [facility] PV LAB;  Service: Cardiovascular;  Laterality: [name];   AV FISTULA PLACEMENT Left 12/02/2022   Procedure: LEFT ARM BRACHIOCEPHALIC ARTERIOVENOUS (AV) FISTULA CREATION;  Surgeon: Gretta Lonni PARAS, MD;  Location: [facility] OR;  Service: Vascular;  Laterality: Left;   AV FISTULA PLACEMENT Left 01/22/2023   Procedure: LEFT ARM BRACHIOBASILISC ARTERIOVENOUS (AV) FISTULA CREATION;  Surgeon: Gretta Lonni PARAS, MD;  Location: [facility] OR;  Service: Vascular;  Laterality: Left;   BASCILIC VEIN TRANSPOSITION Left 05/02/2023   Procedure: Insertion of LEFT Arm Arterio-venous Gortex Graft;  Surgeon: Gretta Lonni PARAS, MD;  Location: [facility] OR;  Service: Vascular;  Laterality: Left;   CARDIAC CATHETERIZATION  05/31/08   EF 55-60%, stable lesions, medical therapy   COLECTOMY  '80's   polyps   CORONARY ANGIOPLASTY WITH STENT PLACEMENT  01/19/08   PCI stent to mid LAD with Promus DES 27.5x28   CYSTOSCOPY WITH DIRECT VISION INTERNAL URETHROTOMY [name] 03/12/2021   Procedure: cystoscoopy withdirect vision internal urethrotomy optilum urethral dilation     ;  Surgeon: Carolee Sherwood JONETTA DOUGLAS, MD;  Location: [facility] ORS;  Service: Urology;  Laterality: [name];  RERQUESTING 45 MINS   DIALYSIS/PERMA CATHETER INSERTION [name] 04/10/2023  Procedure: DIALYSIS/PERMA CATHETER INSERTION;  Surgeon: Tobie Gordy POUR, MD;  Location: [facility] INVASIVE CV LAB;  Service: Cardiovascular;  Laterality: [name];   DIALYSIS/PERMA CATHETER INSERTION [name] 09/05/2023   Procedure: DIALYSIS/PERMA CATHETER INSERTION;  Surgeon: Serene Gaile ORN, MD;  Location: [facility];  Service: Cardiovascular;  Laterality: [name];   INSERTION OF ARTERIOVENOUS (AV) ARTEGRAFT ARM Right 09/24/2023   Procedure: INSERTION, GRAFT, ARTERIOVENOUS, UPPER EXTREMITY;  Surgeon: Magda Debby SAILOR, MD;  Location: [facility] OR;   Service: Vascular;  Laterality: Right;   IR GENERIC HISTORICAL  01/03/2016   IR THORACENTESIS ASP PLEURAL SPACE W/IMG GUIDE 01/03/2016 [facility]-INTERV RAD   IR REMOVAL TUN CV CATH W/O FL  11/07/2023   KNEE ARTHROPLASTY Right 03/04/2018   Procedure: COMPUTER ASSISTED TOTAL KNEE ARTHROPLASTY;  Surgeon: Fidel Rogue, MD;  Location: [facility] ORS;  Service: Orthopedics;  Laterality: Right;   PERIPHERAL VASCULAR THROMBECTOMY  09/03/2023   Procedure: PERIPHERAL VASCULAR THROMBECTOMY;  Surgeon: Lanis Fonda BRAVO, MD;  Location: [facility];  Service: Cardiovascular;;  avf   PTCA  01/2008   TRANSURETHRAL RESECTION OF BLADDER TUMOR [name] 11/10/2020   Procedure: PHYLLIS BOYCE CHARLESETTA GEROLD;  Surgeon: Sherrilee Belvie CROME, MD;  Location: [facility];  Service: Urology;  Laterality: [name];   TRANSURETHRAL RESECTION OF BLADDER TUMOR [name] 08/12/2022   Procedure: TRANSURETHRAL RESECTION OF BLADDER TUMOR (TURBT);  Surgeon: Carolee Sherwood JONETTA DOUGLAS, MD;  Location: [facility];  Service: Urology;  Laterality: [name];   TRANSURETHRAL RESECTION OF BLADDER TUMOR WITH MITOMYCIN -C [name] 09/17/2021   Procedure: TRANSURETHRAL RESECTION OF BLADDER TUMOR WITH GEMCITABINE ;  Surgeon: Carolee Sherwood JONETTA DOUGLAS, MD;  Location: [facility];  Service: Urology;  Laterality: [name];   VENOUS STENT  09/03/2023   Procedure: VENOUS STENT;  Surgeon: Lanis Fonda BRAVO, MD;  Location: [facility];  Service: Cardiovascular;;  avf    Social:  Lives at home with wife.  Independent in ADLs and IADLs. PCP: Teresa Thersia Jansky, MD Substances: Denies prior current use of tobacco alcohol  or drugs.  Family History:  Family History  Problem Relation Age of Onset   Diabetes Mother    Heart attack Mother    Leukemia Father    Heart disease Sister    Heart disease Brother    Diabetes Brother        pancreatic cancer    Allergies: Allergies as of 12/09/2023   (No Known Allergies)    Review of Systems: A complete ROS was negative except as per HPI.   OBJECTIVE:   Physical  Exam: Blood pressure (!) 166/74, pulse (!) 59, temperature 97.6 F (36.4 C), temperature source Oral, resp. rate 15, height 6' (1.829 m), weight 89.8 kg, SpO2 100%.  Constitutional: Well-appearing elderly male laying in bed. In no acute distress. HENT: Normocephalic, atraumatic,  Eyes: Sclera non-icteric, PERRL, EOM intact Cardio:Regular rate and rhythm. 2+ bilateral radial and dorsalis pedis  pulses.  No palpable thrill over bilateral upper extremity fistulas Pulm:Clear to auscultation bilaterally. Normal work of breathing on room air. Abdomen: Soft, non-tender, non-distended, positive bowel sounds. MSK: Trace lower extremity edema bilaterally Skin:Warm and dry. Neuro:Alert and oriented x3. No focal deficit noted. Psych:Pleasant mood and affect.  Labs: CBC    Component Value Date/Time   WBC 7.2 12/09/2023 0840   RBC 3.64 (L) 12/09/2023 0840   HGB 10.9 (L) 12/09/2023 0914   HGB 12.1 (L) 07/12/2019 1142   HCT 32.0 (L) 12/09/2023 0914   HCT 35.8 (L) 07/12/2019 1142   PLT 234 12/09/2023 0840   PLT 226  07/12/2019 1142   MCV 91.2 12/09/2023 0840   MCV 79 07/12/2019 1142   MCH 28.3 12/09/2023 0840   MCHC 31.0 12/09/2023 0840   RDW 16.6 (H) 12/09/2023 0840   RDW 15.2 07/12/2019 1142   LYMPHSABS 1.7 09/02/2023 1647   LYMPHSABS 1.1 07/12/2019 1142   MONOABS 0.6 09/02/2023 1647   EOSABS 0.3 09/02/2023 1647   EOSABS 0.2 07/12/2019 1142   BASOSABS 0.0 09/02/2023 1647   BASOSABS 0.0 07/12/2019 1142     CMP     Component Value Date/Time   NA 138 12/09/2023 0914   NA 142 08/09/2022 0945   K 5.9 (H) 12/09/2023 0914   CL 112 (H) 12/09/2023 0914   CO2 28 09/02/2023 1647   GLUCOSE 89 12/09/2023 0914   BUN 56 (H) 12/09/2023 0914   BUN 52 (H) 08/09/2022 0945   CREATININE 12.80 (H) 12/09/2023 0914   CREATININE 1.27 (H) 01/30/2016 0856   CALCIUM  9.2 09/02/2023 1647   PROT 6.3 (L) 02/08/2023 0955   PROT 6.0 02/07/2021 0920   ALBUMIN 2.8 (L) 02/10/2023 0249   ALBUMIN 3.6 02/07/2021  0920   AST 13 (L) 02/08/2023 0955   ALT 11 02/08/2023 0955   ALKPHOS 39 02/08/2023 0955   BILITOT 0.6 02/08/2023 0955   BILITOT 0.3 02/07/2021 0920   GFRNONAA 5 (L) 09/02/2023 1647   GFRNONAA 50 (L) 08/30/2015 0805   GFRAA 35 (L) 10/07/2019 1113   GFRAA 58 (L) 08/30/2015 0805    Imaging: VAS US  DUPLEX DIALYSIS ACCESS (AVF, AVG) Result Date: 12/09/2023  UPPER EXTREMITY DUPLEX STUDY Patient Name:  CAREEM YASUI  Date of Exam:   12/09/2023 Medical Rec #: [id number]        Accession #:    [id number] Date of Birth: 18-Jul-1933       Patient Gender: M Patient Age:   34 years Exam [facility]:  [facility] Procedure:      VAS US  DUPLEX DIALYSIS ACCESS (AVF, AVG) Referring Phys: FAIRY STEVENS --------------------------------------------------------------------------------  Indications: Occluded AVG right upper arm.  Risk Factors:  Hypertension, hyperlipidemia, Diabetes, coronary artery disease. Other Factors: Two failed left arm AVFs - Brachiocephalic and Brachialbasilic                2024. Performing Technologist: Ricka Sturdivant-Jones RDMS, RVT  Examination Guidelines: A complete evaluation includes B-mode imaging, spectral Doppler, color Doppler, and power Doppler as needed of all accessible portions of each vessel. Bilateral testing is considered an integral part of a complete examination. Limited examinations for reoccurring indications may be performed as noted.  Right Graft #1: +------------------+--------+--------+--------+--------+                   PSV cm/sStenosisWaveformComments +------------------+--------+--------+--------+--------+ Inflow                                             +------------------+--------+--------+--------+--------+ Prox Anastomosis          occluded                 +------------------+--------+--------+--------+--------+ Proximal Graft            occluded                 +------------------+--------+--------+--------+--------+ Mid Graft                  occluded                 +------------------+--------+--------+--------+--------+  Distal Graft              occluded                 +------------------+--------+--------+--------+--------+ Distal Anastomosis        occluded                 +------------------+--------+--------+--------+--------+ Outflow                                            +------------------+--------+--------+--------+--------+   Summary:  Right: Occluded right arteriovenous graft upper arm.    Preliminary    DG Chest 2 View Result Date: 12/09/2023 CLINICAL DATA:  Delayed dialysis. EXAM: CHEST - 2 VIEW COMPARISON:  Chest radiograph 02/08/2023 FINDINGS: Normal cardiac silhouette. Central venous congestion. No overt pulmonary edema. No pleural fluid. Pneumothorax or pneumonia IMPRESSION: Central venous congestion without overt pulmonary edema. Electronically Signed   By: Jackquline Boxer M.D.   On: 12/09/2023 09:47     EKG: personally reviewed my interpretation is normal sinus rhythm. Consistent with prior EKG.  ASSESSMENT & PLAN:   Assessment & Plan by Problem: Principal Problem:   AV fistula thrombosis (HCC) Active Problems:   Type 2 diabetes mellitus with hyperlipidemia (HCC)   Essential hypertension   ESRD (end stage renal disease) (HCC)   Hyperkalemia   Emersyn L Stecher is a 89 y.o. male with pertinent PMH of ESRD on HD TTS, CAD, HFpEF, HTN, and T2DM who presented with right upper extremity AV fistula thrombus and is admitted for placement of tunneled dialysis catheter and hemodialysis.  Right upper extremity AV fistula thrombus Hyperkalemia ESRD on HD TTS Confirmed thrombus on ultrasound Doppler here and unable to cannulate on Saturday or today.  IR to place tunneled dialysis catheter and nephrology to coordinate dialysis.  Unknown current plan for permanent dialysis access due to nonfunctioning bilateral upper extremity AV fistulas.  Potassium 5.9 without any EKG changes.  No signs  of significant fluid overload and satting well on room air. - Tunneled dialysis catheter placement per IR - HD per nephrology - Lokelma  5 mg given in the ED - Repeat renal function panel at 4 PM - Continue home medications of torsemide  20 mg daily  Hypertension Slightly hypertensive here, likely due to not taking his medications before dialysis and not receiving dialysis for the past week. - Continue amlodipine  10 mg daily, carvedilol  25 mg twice daily, and isosorbide  mononitrate 60 mg twice daily  T2DM A1c 6.0 on 02/09/2023. - Sensitive SSI 3 times daily with meals and nightly  Diet: NPO pending tunneled dialysis catheter placement, resume regular diet after VTE: Heparin  Code: Full  Dispo: Admit patient to Observation with expected length of stay less than 2 midnights.  Signed: Fairy Pool, DO Internal Medicine Resident, PGY-3 Please contact the on call pager at [phone] for any urgent or emergent needs. 3:18 PM 12/09/2023

## 2023-12-09 NOTE — ED Notes (Signed)
 Patient ambulated independently with IR NP to restroom

## 2023-12-09 NOTE — ED Provider Notes (Signed)
 [place] [facility]ERGENCY DEPARTMENT AT [facility] Provider Note  CSN: [id number] Arrival date & time: 12/09/23 9167  Chief Complaint(s) Dialysis cath issue  HPI [name] is a 89 y.o. male who is here today because his right AV fistula has not been able to be accessed recently.  Patient had his fistula created on 18 June of this year.  He last had dialysis on Tuesday.  Went to dialysis on Saturday, but they were unable to achieve adequate blood flow, so was not able to complete treatment.  He reports that he has been feeling a little bit short of breath.   Past Medical History Past Medical History:  Diagnosis Date   Allergy     Anemia    CAD (coronary artery disease)    pci to LAD 10/09; stable CAD by cath 05/31/08; myoview 04/11/11- no ishcemia, EF 67%; echo 05/15/09- EF>55%, mod calcification of the aortic valve leaflets   CAD S/P percutaneous coronary angioplasty 05/08/2008   LAD PCI with DES 2009- Myoview low risk 2013   CHF (congestive heart failure) (HCC) 09/07/2019   Chronic back pain    Chronic kidney disease    Cystitis, radiation 12/01/2013   Dyspnea    ED (erectile dysfunction)    Elevated serum creatinine 12/14/2015   Essential hypertension, benign    Hematuria 12/01/2013   History of blood transfusion    Hyperlipidemia    Ileus (HCC) 12/14/2015   Insulin  dependent type 2 diabetes mellitus, controlled (HCC)    Malignant neoplasm of prostate (HCC) 11/26/2013   Multiple rib fractures    left 9th, 10th, and 11th posterior rib fractures S/P fall 12/09/2015/notes 12/12/2015   Osteoarthritis of right knee 03/04/2018   Personal history of COVID-19 09/05/2021   PREMATURE VENTRICULAR CONTRACTIONS 05/08/2008   Qualifier: Diagnosis of  By: Kelsie, MD, James     Prostate cancer [facility])    s/p   Rectal bleeding 12/23/2012   Evaluated by Dr Rollin GI patient with history of radiation proctitis. Patient due for colonoscopy on 04/2014    Type II or unspecified type diabetes  mellitus without mention of complication, not stated as uncontrolled    Ventricular tachycardia (HCC)    resuscitated; monitor 05/2008; attempted T ablation 2/10- aborted due to inappropriate substrate   Patient Active Problem List   Diagnosis Date Noted   Acute exacerbation of congestive heart failure (HCC) 02/08/2023   Stage 5 chronic kidney disease not on chronic dialysis (HCC) 11/22/2022   Aortic valve disease 02/07/2021   CHF (congestive heart failure) (HCC) 09/07/2019   Acute renal failure superimposed on stage 3b chronic kidney disease (HCC) 09/07/2019   Lower extremity edema 08/18/2019   Osteoarthritis of right knee 03/04/2018   Rib pain on left side 07/01/2016   Fall 12/14/2015   Elevated serum creatinine 12/14/2015   Ileus (HCC) 12/14/2015   Multiple fractures of ribs of left side 12/12/2015   Prostate cancer (HCC) 05/26/2014   Hematuria 12/01/2013   Cystitis, radiation 12/01/2013   Hematuria 12/01/2013   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 Authorizing Provider  acetaminophen  (TYLENOL ) 500 MG tablet Take 1,000 mg by mouth in the morning.    [provider]  amLODipine  (NORVASC ) 10 MG tablet TAKE 1 TABLET EVERY DAY (APPOINTMENT IS NEEDED FOR REFILLS) 10/17/23   Court Dorn PARAS, MD  atorvastatin  (LIPITOR) 10 MG tablet Take 1 tablet (10 mg total) by mouth daily. 11/26/23   Court Dorn PARAS, MD  B Complex-C-Folic Acid (DIALYVITE 800) 0.8 MG TABS Take 1  tablet by mouth daily. 06/12/23   [provider]  Blood Glucose Monitoring Suppl (ACCU-CHEK AVIVA PLUS) w/Device KIT Test blood sugar 3 times daily. Dx code: E11.22 04/03/16   Levora Reyes SAUNDERS, MD  Blood Pressure Monitoring (BLOOD PRESS MONITOR/M-L CUFF) MISC 1 application by Does not apply route daily. 04/26/20   Levora Reyes SAUNDERS, MD  calcitRIOL  (ROCALTROL ) 0.25 MCG capsule Take 0.25 mcg by mouth Every Tuesday,Thursday,and Saturday with dialysis.    [provider]  carvedilol  (COREG ) 25 MG tablet TAKE 1 TABLET TWICE DAILY. KEEP OFFICE VISIT 11/07/23   Court Dorn PARAS, MD  Cholecalciferol  (VITAMIN D -3 PO) Take 1 tablet by mouth daily.    [provider]  Continuous Blood Gluc Sensor (FREESTYLE LIBRE SENSOR SYSTEM) MISC 1 application by Does not apply route daily. 06/30/20   Levora Reyes SAUNDERS, MD  Continuous Glucose Monitor Sup KIT 1 kit by Does not apply route as directed. 04/26/20   Levora Reyes SAUNDERS, MD  glucose blood (ACCU-CHEK AVIVA PLUS) test strip Test blood sugar 3 times daily. Dx code: E11.22 04/03/16   Levora Reyes SAUNDERS, MD  insulin  NPH-regular Human (NOVOLIN  70/30) (70-30) 100 UNIT/ML injection Inject 8 Units into the skin 2 (two) times daily with a meal. 02/10/23   Arrien, Elidia Sieving, MD  Insulin  Syringe-Needle U-100 (B-D INS SYR HALF-UNIT .3CC/31G) 31G X 5/16 0.3 ML MISC Use daily at bedtime to inject insulin . Dx code: 250.00 06/21/13   Levora Reyes SAUNDERS, MD  isosorbide  mononitrate (IMDUR ) 60 MG 24 hr tablet TAKE 2 TABLETS EVERY DAY 10/17/23   Berry, Jonathan J, MD  Lancets (ACCU-CHEK MULTICLIX) lancets Test blood sugar 3 times daily. Dx code: E11.22 04/03/16   Levora Reyes SAUNDERS, MD  lidocaine  (LIDODERM ) 5 % Place 1 patch onto the skin daily. Remove & Discard patch within 12 hours or as directed by MD Patient taking differently: Place 1 patch onto the skin daily as needed (Pain). Remove & Discard patch within 12 hours or as directed by MD 09/02/23   Melvenia Motto,  MD  methocarbamol  (ROBAXIN ) 500 MG tablet Take 1 tablet (500 mg total) by mouth every 8 (eight) hours as needed for muscle spasms. 09/02/23   Melvenia Motto, MD  oxyCODONE -acetaminophen  (PERCOCET) 5-325 MG tablet Take 1 tablet by mouth every 4 (four) hours as needed for severe pain (pain score 7-10). 09/24/23 09/23/24  Magda Debby SAILOR, MD  oxyCODONE -acetaminophen  (PERCOCET/ROXICET) 5-325 MG tablet Take 1 tablet by mouth every 8 (eight) hours as needed. 09/12/23   [provider]  Polyethyl Glycol-Propyl Glycol (SYSTANE) 0.4-0.3 % SOLN Place 1 drop into both eyes as needed (dry eyes).    [provider]  tamsulosin  (FLOMAX ) 0.4 MG CAPS capsule Take 0.4 mg by mouth daily. 01/29/21   [provider]  torsemide  (DEMADEX ) 20 MG tablet Take 2 tablets (40 mg total) by mouth 2 (two) times daily. In case of weight gain 2 to 3 lbs in 24 hrs or  5 lbs in 7 days, take one extra tablet a day in the morning, until weight back to baseline. Patient taking differently: Take 20 mg by mouth 2 (two) times daily. In case of weight gain 2 to 3 lbs in 24 hrs or 5 lbs in 7 days, take one extra tablet a day in the morning, until weight back to baseline. 02/10/23   Arrien, Elidia Sieving, MD                                                                                                                                    Past Surgical History Past Surgical History:  Procedure Laterality Date   A/V SHUNT INTERVENTION [name] 08/08/2023   Procedure: A/V SHUNT INTERVENTION;  Surgeon: Tobie Gordy POUR, MD;  Location: [facility] INVASIVE CV LAB;  Service: Cardiovascular;  Laterality: [name];   A/V SHUNT INTERVENTION [name] 09/03/2023   Procedure: A/V SHUNT INTERVENTION;  Surgeon: Lanis Fonda BRAVO, MD;  Location: [facility] PV LAB;  Service: Cardiovascular;  Laterality: [name];   AV FISTULA PLACEMENT Left 12/02/2022   Procedure: LEFT ARM BRACHIOCEPHALIC ARTERIOVENOUS (AV) FISTULA CREATION;  Surgeon: Gretta Lonni PARAS, MD;  Location: [facility] OR;   Service: Vascular;  Laterality: Left;   AV FISTULA PLACEMENT Left 01/22/2023   Procedure: LEFT ARM BRACHIOBASILISC ARTERIOVENOUS (AV) FISTULA CREATION;  Surgeon: Gretta Lonni PARAS, MD;  Location: [facility] OR;  Service: Vascular;  Laterality: Left;   BASCILIC VEIN TRANSPOSITION Left 05/02/2023   Procedure: Insertion of LEFT Arm Arterio-venous Gortex Graft;  Surgeon: Gretta Lonni PARAS, MD;  Location: [facility] OR;  Service: Vascular;  Laterality: Left;   CARDIAC CATHETERIZATION  05/31/08   EF 55-60%, stable lesions, medical therapy   COLECTOMY  '80's   polyps   CORONARY ANGIOPLASTY WITH STENT PLACEMENT  01/19/08   PCI stent to mid LAD with Promus DES 27.5x28   CYSTOSCOPY WITH DIRECT VISION INTERNAL URETHROTOMY [name] 03/12/2021   Procedure: cystoscoopy withdirect vision internal urethrotomy optilum urethral dilation     ;  Surgeon: Carolee Sherwood JONETTA DOUGLAS, MD;  Location: [facility] ORS;  Service: Urology;  Laterality: [name];  RERQUESTING 45 MINS   DIALYSIS/PERMA CATHETER INSERTION [name] 04/10/2023   Procedure: DIALYSIS/PERMA CATHETER INSERTION;  Surgeon: Tobie Gordy POUR, MD;  Location: [facility] INVASIVE CV LAB;  Service: Cardiovascular;  Laterality: [name];   DIALYSIS/PERMA CATHETER INSERTION [name] 09/05/2023   Procedure: DIALYSIS/PERMA CATHETER INSERTION;  Surgeon: Serene Gaile ORN, MD;  Location: [facility] PV LAB;  Service: Cardiovascular;  Laterality: [name];   INSERTION OF ARTERIOVENOUS (AV) ARTEGRAFT ARM Right 09/24/2023   Procedure: INSERTION, GRAFT, ARTERIOVENOUS, UPPER EXTREMITY;  Surgeon: Magda Debby SAILOR, MD;  Location: [facility] OR;  Service: Vascular;  Laterality: Right;   IR GENERIC HISTORICAL  01/03/2016   IR THORACENTESIS ASP PLEURAL SPACE W/IMG GUIDE 01/03/2016 [facility]-INTERV RAD   IR REMOVAL TUN CV CATH W/O FL  11/07/2023   KNEE ARTHROPLASTY Right 03/04/2018   Procedure: COMPUTER ASSISTED TOTAL KNEE ARTHROPLASTY;  Surgeon: Fidel Rogue, MD;  Location: [facility] ORS;  Service: Orthopedics;  Laterality: Right;   PERIPHERAL VASCULAR THROMBECTOMY  09/03/2023    Procedure: PERIPHERAL VASCULAR THROMBECTOMY;  Surgeon: Lanis Fonda BRAVO, MD;  Location: [facility] PV LAB;  Service: Cardiovascular;;  avf   PTCA  01/2008   TRANSURETHRAL RESECTION OF BLADDER TUMOR [name] 11/10/2020   Procedure: PHYLLIS BOYCE CHARLESETTA GEROLD;  Surgeon: Sherrilee Belvie CROME, MD;  Location: [facility] ORS;  Service: Urology;  Laterality: [name];   TRANSURETHRAL RESECTION OF BLADDER TUMOR [name] 08/12/2022   Procedure: TRANSURETHRAL RESECTION OF BLADDER TUMOR (TURBT);  Surgeon: Carolee Sherwood JONETTA DOUGLAS, MD;  Location: [facility] ORS;  Service: Urology;  Laterality: [name];   TRANSURETHRAL RESECTION OF BLADDER TUMOR WITH MITOMYCIN -C [name] 09/17/2021   Procedure: TRANSURETHRAL RESECTION OF BLADDER TUMOR WITH GEMCITABINE ;  Surgeon: Carolee Sherwood JONETTA DOUGLAS, MD;  Location: [facility] ORS;  Service: Urology;  Laterality: [name];   VENOUS STENT  09/03/2023   Procedure: VENOUS STENT;  Surgeon: Lanis Fonda BRAVO, MD;  Location: [facility] LAB;  Service: Cardiovascular;;  avf   Family History Family History  Problem Relation Age of Onset   Diabetes Mother    Heart attack Mother    Leukemia Father    Heart disease Sister    Heart disease Brother    Diabetes Brother        pancreatic cancer    Social History Social History   Tobacco Use   Smoking status: Former    Current packs/day: 0.00    Types: Cigarettes    Quit date: 05/10/2002    Years since quitting: 21.5   Smokeless tobacco: Never  Vaping Use   Vaping status: Never Used  Substance Use Topics   Alcohol  use: No   Drug use: Not Currently    Types: Marijuana    Comment: Last use marijuana was on 01/20/23.  Informed to withhold 1-2 days prior to procedure.   Allergies Patient has no known allergies.  Review of Systems Review of Systems  Physical Exam Vital Signs  I have reviewed the triage vital signs BP (!) 166/74 (BP Location: Left Arm)   Pulse (!) 59   Temp 97.6 F (36.4 C) (Oral)   Resp 15   Ht 6' (1.829 m)   Wt 89.8 kg   SpO2 100%   BMI 26.85 kg/m    Physical Exam Vitals and nursing note reviewed.  Cardiovascular:     Rate and Rhythm: Normal rate.     Comments: No thrill heard at AV fistula on the right side Pulmonary:     Effort: Pulmonary effort is normal.  Musculoskeletal:        General: Normal range of motion.  Neurological:     General: No focal deficit present.     ED Results and Treatments Labs (all labs ordered are listed, but only abnormal results are displayed) Labs Reviewed  CBC - Abnormal; Notable for the following components:      Result Value   RBC 3.64 (*)    Hemoglobin 10.3 (*)    HCT 33.2 (*)    RDW 16.6 (*)    All other components within normal limits  I-STAT CHEM 8, ED - Abnormal; Notable for the following components:   Potassium 5.9 (*)    Chloride 112 (*)    BUN 56 (*)    Creatinine, Ser 12.80 (*)    Calcium , Ion 1.12 (*)    TCO2 18 (*)    Hemoglobin 10.9 (*)    HCT 32.0 (*)  All other components within normal limits  TROPONIN I (HIGH SENSITIVITY) - Abnormal; Notable for the following components:   Troponin I (High Sensitivity) 19 (*)    All other components within normal limits  TROPONIN I (HIGH SENSITIVITY) - Abnormal; Notable for the following components:   Troponin I (High Sensitivity) 20 (*)    All other components within normal limits                                                                                                                          Radiology VAS US  DUPLEX DIALYSIS ACCESS (AVF, AVG) Result Date: 12/09/2023  UPPER EXTREMITY DUPLEX STUDY Patient Name:  DARREL GLOSS  Date of Exam:   12/09/2023 Medical Rec #: [id number]        Accession #:    [id number] Date of Birth: 03/18/1934       Patient Gender: M Patient Age:   75 years Exam Location:  [facility] Procedure:      VAS US  DUPLEX DIALYSIS ACCESS (AVF, AVG) Referring Phys: FAIRY Coriann Brouhard --------------------------------------------------------------------------------  Indications: Occluded AVG right upper  arm.  Risk Factors:  Hypertension, hyperlipidemia, Diabetes, coronary artery disease. Other Factors: Two failed left arm AVFs - Brachiocephalic and Brachialbasilic                2024. Performing Technologist: Ricka Sturdivant-Jones RDMS, RVT  Examination Guidelines: A complete evaluation includes B-mode imaging, spectral Doppler, color Doppler, and power Doppler as needed of all accessible portions of each vessel. Bilateral testing is considered an integral part of a complete examination. Limited examinations for reoccurring indications may be performed as noted.  Right Graft #1: +------------------+--------+--------+--------+--------+                   PSV cm/sStenosisWaveformComments +------------------+--------+--------+--------+--------+ Inflow                                             +------------------+--------+--------+--------+--------+ Prox Anastomosis          occluded                 +------------------+--------+--------+--------+--------+ Proximal Graft            occluded                 +------------------+--------+--------+--------+--------+ Mid Graft                 occluded                 +------------------+--------+--------+--------+--------+ Distal Graft              occluded                 +------------------+--------+--------+--------+--------+ Distal Anastomosis        occluded                 +------------------+--------+--------+--------+--------+ Outflow                                            +------------------+--------+--------+--------+--------+  Summary:  Right: Occluded right arteriovenous graft upper arm.    Preliminary    DG Chest 2 View Result Date: 12/09/2023 CLINICAL DATA:  Delayed dialysis. EXAM: CHEST - 2 VIEW COMPARISON:  Chest radiograph 02/08/2023 FINDINGS: Normal cardiac silhouette. Central venous congestion. No overt pulmonary edema. No pleural fluid. Pneumothorax or pneumonia IMPRESSION: Central venous congestion  without overt pulmonary edema. Electronically Signed   By: Jackquline Boxer M.D.   On: 12/09/2023 09:47    Pertinent labs & imaging results that were available during my care of the patient were reviewed by me and considered in my medical decision making (see MDM for details).  Medications Ordered in ED Medications  calcium  gluconate 1 g/ 50 mL sodium chloride  IVPB (0 mg Intravenous Stopped 12/09/23 1142)  sodium zirconium cyclosilicate  (LOKELMA ) packet 5 g (5 g Oral Given 12/09/23 1057)                                                                                                                                     Procedures .Critical Care  Performed by: Mannie Fairy DASEN, DO Authorized by: Mannie Fairy DASEN, DO   Critical care provider statement:    Critical care time (minutes):  45   Critical care was necessary to treat or prevent imminent or life-threatening deterioration of the following conditions:  Metabolic crisis   Critical care was time spent personally by me on the following activities:  Development of treatment plan with patient or surrogate, discussions with consultants, evaluation of patient's response to treatment, examination of patient, ordering and review of laboratory studies, ordering and review of radiographic studies, ordering and performing treatments and interventions, pulse oximetry, re-evaluation of patient's condition and review of old charts   (including critical care time)  Medical Decision Making / ED Course   This patient presents to the ED for concern of issues with his AV fistula, this involves an extensive number of treatment options, and is a complaint that carries with it a high risk of complications and morbidity.  The differential diagnosis includes ischial occlusion, less likely infection.  MDM: Patient is been without dialysis for 1 week.  Will check labs, chest x-ray.  Have ordered ultrasound of the patient's fistula for further evaluation.   Patient will likely require vascular surgery consultation.   Reassessment 12:40 PM-spoke with vascular surgery, they recommend placing a dialysis catheter as the patient has had multiple interventions on this fistula and likely will not be able to be revised again.  Spoke with Dr. Arlana from nephrology to let him know patient was being admitted.  Patient admitted to teaching service.  Additional history obtained: -Additional history obtained from wife at bedside -External records from outside source obtained and reviewed including: Chart review including previous notes, labs, imaging, consultation notes   Lab Tests: -I ordered, reviewed, and interpreted labs.   The pertinent results include:   Labs Reviewed  CBC - Abnormal;  Notable for the following components:      Result Value   RBC 3.64 (*)    Hemoglobin 10.3 (*)    HCT 33.2 (*)    RDW 16.6 (*)    All other components within normal limits  I-STAT CHEM 8, ED - Abnormal; Notable for the following components:   Potassium 5.9 (*)    Chloride 112 (*)    BUN 56 (*)    Creatinine, Ser 12.80 (*)    Calcium , Ion 1.12 (*)    TCO2 18 (*)    Hemoglobin 10.9 (*)    HCT 32.0 (*)    All other components within normal limits  TROPONIN I (HIGH SENSITIVITY) - Abnormal; Notable for the following components:   Troponin I (High Sensitivity) 19 (*)    All other components within normal limits  TROPONIN I (HIGH SENSITIVITY) - Abnormal; Notable for the following components:   Troponin I (High Sensitivity) 20 (*)    All other components within normal limits      EKG my independent review of the patient's EKG shows no ST segment depressions or elevations, no T wave inversions, no evidence of acute ischemia.  EKG Interpretation Date/Time:  Tuesday December 09 2023 08:40:22 EDT Ventricular Rate:  67 PR Interval:  140 QRS Duration:  82 QT Interval:  368 QTC Calculation: 388 R Axis:   81  Text Interpretation: Normal sinus rhythm Normal ECG  When compared with ECG of 08-Feb-2023 08:55, PREVIOUS ECG IS PRESENT Confirmed by Mannie Pac [phone]) on 12/09/2023 12:43:49 PM         Imaging Studies ordered: I ordered imaging studies including vascular ultrasound, chest x-ray I independently visualized and interpreted imaging. I agree with the radiologist interpretation   Medicines ordered and prescription drug management: Meds ordered this encounter  Medications   DISCONTD: diazepam  (VALIUM ) tablet 10 mg   calcium  gluconate 1 g/ 50 mL sodium chloride  IVPB   sodium zirconium cyclosilicate  (LOKELMA ) packet 5 g    -I have reviewed the patients home medicines and have made adjustments as needed  Critical interventions Hyperkalemia   Cardiac Monitoring: The patient was maintained on a cardiac monitor.  I personally viewed and interpreted the cardiac monitored which showed an underlying rhythm of: Normal sinus rhythm  Social Determinants of Health:  Factors impacting patients care include: Multiple medical comorbidities including end-stage renal disease   Reevaluation: After the interventions noted above, I reevaluated the patient and found that they have :improved  Co morbidities that complicate the patient evaluation  Past Medical History:  Diagnosis Date   Allergy     Anemia    CAD (coronary artery disease)    pci to LAD 10/09; stable CAD by cath 05/31/08; myoview 04/11/11- no ishcemia, EF 67%; echo 05/15/09- EF>55%, mod calcification of the aortic valve leaflets   CAD S/P percutaneous coronary angioplasty 05/08/2008   LAD PCI with DES 2009- Myoview low risk 2013   CHF (congestive heart failure) (HCC) 09/07/2019   Chronic back pain    Chronic kidney disease    Cystitis, radiation 12/01/2013   Dyspnea    ED (erectile dysfunction)    Elevated serum creatinine 12/14/2015   Essential hypertension, benign    Hematuria 12/01/2013   History of blood transfusion    Hyperlipidemia    Ileus (HCC) 12/14/2015   Insulin   dependent type 2 diabetes mellitus, controlled (HCC)    Malignant neoplasm of prostate (HCC) 11/26/2013   Multiple rib fractures    left 9th, 10th, and  11th posterior rib fractures S/P fall 12/09/2015/notes 12/12/2015   Osteoarthritis of right knee 03/04/2018   Personal history of COVID-19 09/05/2021   PREMATURE VENTRICULAR CONTRACTIONS 05/08/2008   Qualifier: Diagnosis of  By: Kelsie, MD, James     Prostate cancer [facility])    s/p   Rectal bleeding 12/23/2012   Evaluated by Dr Rollin GI patient with history of radiation proctitis. Patient due for colonoscopy on 04/2014    Type II or unspecified type diabetes mellitus without mention of complication, not stated as uncontrolled    Ventricular tachycardia (HCC)    resuscitated; monitor 05/2008; attempted T ablation 2/10- aborted due to inappropriate substrate      Final Clinical Impression(s) / ED Diagnoses Final diagnoses:  Hyperkalemia     @PCDICTATION @    Mannie Pac T, DO 12/09/23 1244

## 2023-12-09 NOTE — Plan of Care (Signed)
  Problem: Education: Goal: Knowledge of disease and its progression will improve 12/09/2023 1557 by Shela Lapine, RN Outcome: Progressing 12/09/2023 1506 by Shela Lapine, RN Outcome: Progressing Goal: Individualized Educational Video(s) 12/09/2023 1557 by Shela Lapine, RN Outcome: Progressing 12/09/2023 1506 by Shela Lapine, RN Outcome: Progressing   Problem: Fluid Volume: Goal: Compliance with measures to maintain balanced fluid volume will improve 12/09/2023 1557 by Shela Lapine, RN Outcome: Progressing 12/09/2023 1506 by Shela Lapine, RN Outcome: Progressing   Problem: Health Behavior/Discharge Planning: Goal: Ability to manage health-related needs will improve 12/09/2023 1557 by Shela Lapine, RN Outcome: Progressing 12/09/2023 1506 by Shela Lapine, RN Outcome: Progressing   Problem: Nutritional: Goal: Ability to make healthy dietary choices will improve 12/09/2023 1557 by Shela Lapine, RN Outcome: Progressing 12/09/2023 1506 by Shela Lapine, RN Outcome: Progressing   Problem: Clinical Measurements: Goal: Complications related to the disease process, condition or treatment will be avoided or minimized 12/09/2023 1557 by Shela Lapine, RN Outcome: Progressing 12/09/2023 1506 by Shela Lapine, RN Outcome: Progressing   Problem: Education: Goal: Ability to describe self-care measures that may prevent or decrease complications (Diabetes Survival Skills Education) will improve Outcome: Progressing Goal: Individualized Educational Video(s) Outcome: Progressing   Problem: Coping: Goal: Ability to adjust to condition or change in health will improve Outcome: Progressing   Problem: Fluid Volume: Goal: Ability to maintain a balanced intake and output will improve Outcome: Progressing   Problem: Health Behavior/Discharge Planning: Goal: Ability to identify and utilize available resources and services will improve Outcome: Progressing Goal: Ability to manage  health-related needs will improve Outcome: Progressing   Problem: Metabolic: Goal: Ability to maintain appropriate glucose levels will improve Outcome: Progressing   Problem: Nutritional: Goal: Maintenance of adequate nutrition will improve Outcome: Progressing Goal: Progress toward achieving an optimal weight will improve Outcome: Progressing   Problem: Skin Integrity: Goal: Risk for impaired skin integrity will decrease Outcome: Progressing   Problem: Tissue Perfusion: Goal: Adequacy of tissue perfusion will improve Outcome: Progressing   Problem: Education: Goal: Ability to describe self-care measures that may prevent or decrease complications (Diabetes Survival Skills Education) will improve Outcome: Progressing Goal: Individualized Educational Video(s) Outcome: Progressing   Problem: Coping: Goal: Ability to adjust to condition or change in health will improve Outcome: Progressing   Problem: Fluid Volume: Goal: Ability to maintain a balanced intake and output will improve Outcome: Progressing   Problem: Health Behavior/Discharge Planning: Goal: Ability to identify and utilize available resources and services will improve Outcome: Progressing Goal: Ability to manage health-related needs will improve Outcome: Progressing   Problem: Metabolic: Goal: Ability to maintain appropriate glucose levels will improve Outcome: Progressing   Problem: Nutritional: Goal: Maintenance of adequate nutrition will improve Outcome: Progressing Goal: Progress toward achieving an optimal weight will improve Outcome: Progressing   Problem: Skin Integrity: Goal: Risk for impaired skin integrity will decrease Outcome: Progressing   Problem: Tissue Perfusion: Goal: Adequacy of tissue perfusion will improve Outcome: Progressing

## 2023-12-09 NOTE — Procedures (Signed)
 Interventional Radiology Procedure Note  Procedure: Tunneled hemodialysis catheter placement  Findings: Please refer to procedural dictation for full description. 19 cm via right internal jugular vein.  Complications: none immediate  Estimated Blood Loss: < 5 Ml  Recommendations: Catheter ready for immediate use.   Ester Sides, MD

## 2023-12-09 NOTE — ED Notes (Signed)
 Awaiting lokelma from main pharmacy

## 2023-12-09 NOTE — ED Notes (Signed)
 Awaiting verification for lokelma 

## 2023-12-09 NOTE — Consult Note (Signed)
 Chief Complaint: Malfunctioning AVF   Referring Provider(s): Dr. Geralynn  Supervising Physician: Jennefer Rover  Patient Status: [facility] - In-pt  History of Present Illness: [name] is a 89 y.o. male with PMH significant for HTN, CHF, HLD, DM, ESRD on HD T-Th-Sat. He has been receiving HD via RUE AV graft; history of two L AVFs in 2024. On Saturday, patient was not able to complete treatment due to malfunction of current RUE access site. Last HD Tuesday. R AVG examined tday with vasc US  with noted occluded right arteriovenous graft upper arm.   No further intervention anticipated by vascular surgery to salvage graft. IR consulted for [facility] placement. Patient is to be admitted.   Patient is known to IR from R internal jugular TDC removal 11/07/23 and previous thoracenteses.   Confirms NPO since MN.  Does not wear CPAP or use supplemental home O2. Ambulatory.  Denies fever, chills, CP, sore throat, N/V, abd pain, blood in stool or urine, dysuria, abnormal bruising, dizziness. Endorses mild SOB.    Allergies Reviewed:  Patient has no known allergies.   Patient is Full Code  Past Medical History:  Diagnosis Date   Allergy     Anemia    CAD (coronary artery disease)    pci to LAD 10/09; stable CAD by cath 05/31/08; myoview 04/11/11- no ishcemia, EF 67%; echo 05/15/09- EF>55%, mod calcification of the aortic valve leaflets   CAD S/P percutaneous coronary angioplasty 05/08/2008   LAD PCI with DES 2009- Myoview low risk 2013   CHF (congestive heart failure) (HCC) 09/07/2019   Chronic back pain    Chronic kidney disease    Cystitis, radiation 12/01/2013   Dyspnea    ED (erectile dysfunction)    Elevated serum creatinine 12/14/2015   Essential hypertension, benign    Hematuria 12/01/2013   History of blood transfusion    Hyperlipidemia    Ileus (HCC) 12/14/2015   Insulin  dependent type 2 diabetes mellitus, controlled (HCC)    Malignant neoplasm of prostate (HCC) 11/26/2013    Multiple rib fractures    left 9th, 10th, and 11th posterior rib fractures S/P fall 12/09/2015/notes 12/12/2015   Osteoarthritis of right knee 03/04/2018   Personal history of COVID-19 09/05/2021   PREMATURE VENTRICULAR CONTRACTIONS 05/08/2008   Qualifier: Diagnosis of  By: Kelsie, MD, James     Prostate cancer [facility])    s/p   Rectal bleeding 12/23/2012   Evaluated by Dr Rollin GI patient with history of radiation proctitis. Patient due for colonoscopy on 04/2014    Type II or unspecified type diabetes mellitus without mention of complication, not stated as uncontrolled    Ventricular tachycardia (HCC)    resuscitated; monitor 05/2008; attempted T ablation 2/10- aborted due to inappropriate substrate    Past Surgical History:  Procedure Laterality Date   A/V SHUNT INTERVENTION [name] 08/08/2023   Procedure: A/V SHUNT INTERVENTION;  Surgeon: Tobie Gordy POUR, MD;  Location[facility] INVASIVE CV LAB;  Service: Cardiovascular;  Laterality: [name];   A/V SHUNT INTERVENTION [name] 09/03/2023   Procedure: A/V SHUNT INTERVENTION;  Surgeon: Lanis Fonda BRAVO, MD;  Location: [facility] PV LAB;  Service: Cardiovascular;  Laterality: [name];   AV FISTULA PLACEMENT Left 12/02/2022   Procedure: LEFT ARM BRACHIOCEPHALIC ARTERIOVENOUS (AV) FISTULA CREATION;  Surgeon: Gretta Lonni PARAS, MD;  Location: [facility] OR;  Service: Vascular;  Laterality: Left;   AV FISTULA PLACEMENT Left 01/22/2023   Procedure: LEFT ARM BRACHIOBASILISC ARTERIOVENOUS (AV) FISTULA CREATION;  Surgeon: Gretta Lonni PARAS, MD;  Location: [facility] OR;  Service: Vascular;  Laterality: Left;   BASCILIC VEIN TRANSPOSITION Left 05/02/2023   Procedure: Insertion of LEFT Arm Arterio-venous Gortex Graft;  Surgeon: Gretta Lonni PARAS, MD;  Location: [facility] OR;  Service: Vascular;  Laterality: Left;   CARDIAC CATHETERIZATION  05/31/08   EF 55-60%, stable lesions, medical therapy   COLECTOMY  '80's   polyps   CORONARY ANGIOPLASTY WITH STENT PLACEMENT  01/19/08   PCI stent to mid LAD with  Promus DES 27.5x28   CYSTOSCOPY WITH DIRECT VISION INTERNAL URETHROTOMY [name] 03/12/2021   Procedure: cystoscoopy withdirect vision internal urethrotomy optilum urethral dilation     ;  Surgeon: Carolee Sherwood JONETTA DOUGLAS, MD;  Location: [facility] ORS;  Service: Urology;  Laterality: [name];  RERQUESTING 45 MINS   DIALYSIS/PERMA CATHETER INSERTION [name] 04/10/2023   Procedure: DIALYSIS/PERMA CATHETER INSERTION;  Surgeon: Tobie Gordy POUR, MD;  Location: [facility] INVASIVE CV LAB;  Service: Cardiovascular;  Laterality: [name];   DIALYSIS/PERMA CATHETER INSERTION [name] 09/05/2023   Procedure: DIALYSIS/PERMA CATHETER INSERTION;  Surgeon: Serene Gaile ORN, MD;  Location: [facility] PV LAB;  Service: Cardiovascular;  Laterality: [name];   INSERTION OF ARTERIOVENOUS (AV) ARTEGRAFT ARM Right 09/24/2023   Procedure: INSERTION, GRAFT, ARTERIOVENOUS, UPPER EXTREMITY;  Surgeon: Magda Debby SAILOR, MD;  Location: [facility] OR;  Service: Vascular;  Laterality: Right;   IR GENERIC HISTORICAL  01/03/2016   IR THORACENTESIS ASP PLEURAL SPACE W/IMG GUIDE 01/03/2016 [facility]-INTERV RAD   IR REMOVAL TUN CV CATH W/O FL  11/07/2023   KNEE ARTHROPLASTY Right 03/04/2018   Procedure: COMPUTER ASSISTED TOTAL KNEE ARTHROPLASTY;  Surgeon: Fidel Rogue, MD;  Location: [facility] ORS;  Service: Orthopedics;  Laterality: Right;   PERIPHERAL VASCULAR THROMBECTOMY  09/03/2023   Procedure: PERIPHERAL VASCULAR THROMBECTOMY;  Surgeon: Lanis Fonda BRAVO, MD;  Location: [facility] PV LAB;  Service: Cardiovascular;;  avf   PTCA  01/2008   TRANSURETHRAL RESECTION OF BLADDER TUMOR [name] 11/10/2020   Procedure: PHYLLIS BOYCE CHARLESETTA GEROLD;  Surgeon: Sherrilee Belvie CROME, MD;  Location: [facility] ORS;  Service: Urology;  Laterality: [name];   TRANSURETHRAL RESECTION OF BLADDER TUMOR [name] 08/12/2022   Procedure: TRANSURETHRAL RESECTION OF BLADDER TUMOR (TURBT);  Surgeon: Carolee Sherwood JONETTA DOUGLAS, MD;  Location: [facility] ORS;  Service: Urology;  Laterality: [name];   TRANSURETHRAL RESECTION OF BLADDER TUMOR WITH MITOMYCIN -C [name] 09/17/2021    Procedure: TRANSURETHRAL RESECTION OF BLADDER TUMOR WITH GEMCITABINE ;  Surgeon: Carolee Sherwood JONETTA DOUGLAS, MD;  Location: [facility] ORS;  Service: Urology;  Laterality: [name];   VENOUS STENT  09/03/2023   Procedure: VENOUS STENT;  Surgeon: Lanis Fonda BRAVO, MD;  Location: [facility] PV LAB;  Service: Cardiovascular;;  avf      Medications: Prior to Admission medications   Medication Sig Start Date End Date Taking? Authorizing Provider  acetaminophen  (TYLENOL ) 500 MG tablet Take 1,000 mg by mouth in the morning.    [provider]  amLODipine  (NORVASC ) 10 MG tablet TAKE 1 TABLET EVERY DAY (APPOINTMENT IS NEEDED FOR REFILLS) 10/17/23   Court Dorn PARAS, MD  atorvastatin  (LIPITOR) 10 MG tablet Take 1 tablet (10 mg total) by mouth daily. 11/26/23   Court Dorn PARAS, MD  B Complex-C-Folic Acid (DIALYVITE 800) 0.8 MG TABS Take 1 tablet by mouth daily. 06/12/23   [provider]  Blood Glucose Monitoring Suppl (ACCU-CHEK AVIVA PLUS) w/Device KIT Test blood sugar 3 times daily. Dx code: E11.22 04/03/16   Levora Reyes SAUNDERS, MD  Blood Pressure Monitoring (BLOOD PRESS MONITOR/M-L CUFF) MISC 1 application by Does not apply  route daily. 04/26/20   Levora Reyes SAUNDERS, MD  calcitRIOL  (ROCALTROL ) 0.25 MCG capsule Take 0.25 mcg by mouth Every Tuesday,Thursday,and Saturday with dialysis.    [provider]  carvedilol  (COREG ) 25 MG tablet TAKE 1 TABLET TWICE DAILY. KEEP OFFICE VISIT 11/07/23   Court Dorn PARAS, MD  Cholecalciferol  (VITAMIN D -3 PO) Take 1 tablet by mouth daily.    [provider]  Continuous Blood Gluc Sensor (FREESTYLE LIBRE SENSOR SYSTEM) MISC 1 application by Does not apply route daily. 06/30/20   Levora Reyes SAUNDERS, MD  Continuous Glucose Monitor Sup KIT 1 kit by Does not apply route as directed. 04/26/20   Levora Reyes SAUNDERS, MD  glucose blood (ACCU-CHEK AVIVA PLUS) test strip Test blood sugar 3 times daily. Dx code: E11.22 04/03/16   Levora Reyes SAUNDERS, MD  insulin  NPH-regular Human  (NOVOLIN  70/30) (70-30) 100 UNIT/ML injection Inject 8 Units into the skin 2 (two) times daily with a meal. 02/10/23   Arrien, Elidia Sieving, MD  Insulin  Syringe-Needle U-100 (B-D INS SYR HALF-UNIT .3CC/31G) 31G X 5/16 0.3 ML MISC Use daily at bedtime to inject insulin . Dx code: 250.00 06/21/13   Levora Reyes SAUNDERS, MD  isosorbide  mononitrate (IMDUR ) 60 MG 24 hr tablet TAKE 2 TABLETS EVERY DAY 10/17/23   Berry, Jonathan J, MD  Lancets (ACCU-CHEK MULTICLIX) lancets Test blood sugar 3 times daily. Dx code: E11.22 04/03/16   Levora Reyes SAUNDERS, MD  lidocaine  (LIDODERM ) 5 % Place 1 patch onto the skin daily. Remove & Discard patch within 12 hours or as directed by MD Patient taking differently: Place 1 patch onto the skin daily as needed (Pain). Remove & Discard patch within 12 hours or as directed by MD 09/02/23   Melvenia Motto, MD  methocarbamol  (ROBAXIN ) 500 MG tablet Take 1 tablet (500 mg total) by mouth every 8 (eight) hours as needed for muscle spasms. 09/02/23   Melvenia Motto, MD  oxyCODONE -acetaminophen  (PERCOCET) 5-325 MG tablet Take 1 tablet by mouth every 4 (four) hours as needed for severe pain (pain score 7-10). 09/24/23 09/23/24  Magda Debby SAILOR, MD  oxyCODONE -acetaminophen  (PERCOCET/ROXICET) 5-325 MG tablet Take 1 tablet by mouth every 8 (eight) hours as needed. 09/12/23   [provider]  Polyethyl Glycol-Propyl Glycol (SYSTANE) 0.4-0.3 % SOLN Place 1 drop into both eyes as needed (dry eyes).    [provider]  tamsulosin  (FLOMAX ) 0.4 MG CAPS capsule Take 0.4 mg by mouth daily. 01/29/21   [provider]  torsemide  (DEMADEX ) 20 MG tablet Take 2 tablets (40 mg total) by mouth 2 (two) times daily. In case of weight gain 2 to 3 lbs in 24 hrs or 5 lbs in 7 days, take one extra tablet a day in the morning, until weight back to baseline. Patient taking differently: Take 20 mg by mouth 2 (two) times daily. In case of weight gain 2 to 3 lbs in 24 hrs or 5 lbs in 7 days, take one  extra tablet a day in the morning, until weight back to baseline. 02/10/23   Arrien, Elidia Sieving, MD     Family History  Problem Relation Age of Onset   Diabetes Mother    Heart attack Mother    Leukemia Father    Heart disease Sister    Heart disease Brother    Diabetes Brother        pancreatic cancer    Social History   Socioeconomic History   Marital status: Married    Spouse name: Not  on file   Number of children: 7   Years of education: Not on file   Highest education level: Not on file  Occupational History   Occupation: RETIRED    Employer: RETIRED  Tobacco Use   Smoking status: Former    Current packs/day: 0.00    Types: Cigarettes    Quit date: 05/10/2002    Years since quitting: 21.5   Smokeless tobacco: Never  Vaping Use   Vaping status: Never Used  Substance and Sexual Activity   Alcohol  use: No   Drug use: Not Currently    Types: Marijuana    Comment: Last use marijuana was on 01/20/23.  Informed to withhold 1-2 days prior to procedure.   Sexual activity: Not Currently  Other Topics Concern   Not on file  Social History Narrative   Not on file   Social Drivers of Health   Financial Resource Strain: Not on file  Food Insecurity: No Food Insecurity (02/08/2023)   Hunger Vital Sign    Worried About Running Out of Food in the Last Year: Never true    Ran Out of Food in the Last Year: Never true  Transportation Needs: No Transportation Needs (02/08/2023)   PRAPARE - Administrator, Civil Service (Medical): No    Lack of Transportation (Non-Medical): No  Physical Activity: Not on file  Stress: Not on file  Social Connections: Unknown (10/02/2022)   Received from [facility]   Social Network    Social Network: Not on file     Review of Systems: A 12 point ROS discussed and pertinent positives are indicated in the HPI above.  All other systems are negative.    Vital Signs: BP (!) 166/74 (BP Location: Left Arm)   Pulse (!) 59    Temp 97.6 F (36.4 C) (Oral)   Resp 15   Ht 6' (1.829 m)   Wt 198 lb (89.8 kg)   SpO2 100%   BMI 26.85 kg/m   Advance Care Plan: No documents on file    Physical Exam HENT:     Mouth/Throat:     Mouth: Mucous membranes are moist.     Pharynx: Oropharynx is clear.  Cardiovascular:     Rate and Rhythm: Normal rate and regular rhythm.     Pulses: Normal pulses.     Comments: R radial 2+ Pulmonary:     Effort: Pulmonary effort is normal.  Abdominal:     General: There is no distension.     Palpations: Abdomen is soft.  Musculoskeletal:     Comments: No thrill felt with palpation of R AVG  Skin:    General: Skin is warm and dry.     Capillary Refill: Capillary refill takes less than 2 seconds.     Comments: Well healed R chest scar from previous TDC, nontender. No wounds or rash to chest.   Neurological:     Mental Status: He is alert and oriented to person, place, and time.     Comments: No RUE sensory deficits     Imaging: VAS US  DUPLEX DIALYSIS ACCESS (AVF, AVG) Result Date: 12/09/2023  UPPER EXTREMITY DUPLEX STUDY Patient Name:  [name]  Date of Exam:   12/09/2023 Medical Rec #: [id number]        Accession #:    [id number] Date of Birth: [birth year]       Patient Gender: M Patient Age:   39 years Exam Location:  [facility] Procedure:  VAS US  DUPLEX DIALYSIS ACCESS (AVF, AVG) Referring Phys: JOSEPH STEVENS --------------------------------------------------------------------------------  Indications: Occluded AVG right upper arm.  Risk Factors:  Hypertension, hyperlipidemia, Diabetes, coronary artery disease. Other Factors: Two failed left arm AVFs - Brachiocephalic and Brachialbasilic                2024. Performing Technologist: Ricka Sturdivant-Jones RDMS, RVT  Examination Guidelines: A complete evaluation includes B-mode imaging, spectral Doppler, color Doppler, and power Doppler as needed of all accessible portions of each vessel. Bilateral testing is  considered an integral part of a complete examination. Limited examinations for reoccurring indications may be performed as noted.  Right Graft #1: +------------------+--------+--------+--------+--------+                   PSV cm/sStenosisWaveformComments +------------------+--------+--------+--------+--------+ Inflow                                             +------------------+--------+--------+--------+--------+ Prox Anastomosis          occluded                 +------------------+--------+--------+--------+--------+ Proximal Graft            occluded                 +------------------+--------+--------+--------+--------+ Mid Graft                 occluded                 +------------------+--------+--------+--------+--------+ Distal Graft              occluded                 +------------------+--------+--------+--------+--------+ Distal Anastomosis        occluded                 +------------------+--------+--------+--------+--------+ Outflow                                            +------------------+--------+--------+--------+--------+   Summary:  Right: Occluded right arteriovenous graft upper arm.    Preliminary    DG Chest 2 View Result Date: 12/09/2023 CLINICAL DATA:  Delayed dialysis. EXAM: CHEST - 2 VIEW COMPARISON:  Chest radiograph 02/08/2023 FINDINGS: Normal cardiac silhouette. Central venous congestion. No overt pulmonary edema. No pleural fluid. Pneumothorax or pneumonia IMPRESSION: Central venous congestion without overt pulmonary edema. Electronically Signed   By: Jackquline Boxer M.D.   On: 12/09/2023 09:47    Labs:  CBC: Recent Labs    02/08/23 0955 02/09/23 0239 04/10/23 0817 09/02/23 1647 09/24/23 1022 12/09/23 0840 12/09/23 0914  WBC 7.9 7.8  --  5.8  --  7.2  --   HGB 9.1* 9.2*   < > 11.6* 10.9* 10.3* 10.9*  HCT 28.9* 30.0*   < > 36.4* 32.0* 33.2* 32.0*  PLT 225 227  --  239  --  234  --    < > = values in this  interval not displayed.    COAGS: No results for input(s): INR, APTT in the last 8760 hours.  BMP: Recent Labs    02/08/23 0955 02/09/23 0239 02/10/23 0249 04/10/23 0817 05/02/23 0739 09/02/23 1647 09/24/23 1022 12/09/23 0914  NA 140 142 141   < > 139  138 136 138  K 4.3 4.1 3.9   < > 3.7 4.8 5.7* 5.9*  CL 114* 117* 116*   < > 101 98 101 112*  CO2 16* 15* 15*  --   --  28  --   --   GLUCOSE 162* 114* 106*   < > 122* 106* 114* 89  BUN 74* 69* 75*   < > 28* 44* 41* 56*  CALCIUM  8.4* 8.6* 8.7*  --   --  9.2  --   --   CREATININE 6.46* 6.33* 6.58*   < > 5.90* 8.95* 7.30* 12.80*  GFRNONAA 8* 8* 8*  --   --  5*  --   --    < > = values in this interval not displayed.    LIVER FUNCTION TESTS: Recent Labs    02/08/23 0955 02/09/23 0239 02/10/23 0249  BILITOT 0.6  --   --   AST 13*  --   --   ALT 11  --   --   ALKPHOS 39  --   --   PROT 6.3*  --   --   ALBUMIN 3.0* 2.9* 2.8*    TUMOR MARKERS: No results for input(s): AFPTM, CEA, CA199, CHROMGRNA in the last 8760 hours.  Assessment and Plan:  Patient in need of TDC as R AVG is occluded and not anticipated to be salvaged. Tentatively planned 9/2, possibly 9/3 in AM.  No contraindications for procedure identified in ROS, physical exam, or review of pre-sedation considerations. No pending blood cultures at this time. WBC WNL. K 5.9. VSS, afebrile Patient not asked to hold any AC/AP for this low bleeding risk procedure Abx - ancef  2 G No known allergies    Risks and benefits discussed with the patient including, but not limited to bleeding, infection, vascular injury, pneumothorax which may require chest tube placement, air embolism or even death  All of the patient's questions were answered, patient is agreeable to proceed. Consent signed and in chart.   Thank you for allowing our service to participate in [name] 's care.    Electronically Signed: Laymon Coast, NP   12/09/2023, 1:46  PM     I spent a total of 20 Minutes    in face to face in clinical consultation, greater than 50% of which was counseling/coordinating care for imaged guided [facility].   (A copy of this note was sent to the referring provider and the time of visit.)

## 2023-12-09 NOTE — ED Notes (Signed)
 Patient transported to X-ray

## 2023-12-09 NOTE — Consult Note (Signed)
 Renal Service Consult Note [facility] Lamar JONETTA Fret, MD  Patient: [name] Date: 12/09/2023 Requesting Physician: Dr. Rosan  Reason for Consult: ESRD pt w/ clotted access HPI: The patient is a 89 y.o. year-old w/ PMH as below who presented to ED this morning c/o last HD Tuesday and his dialysis port is not working/ clogged. Mild SOB. Went to HD Sat and they were not able to get adequate function of his HD access. In ED BP 144/65, HR 60-70, RR 11-19, temp 97.6. 100% sats on RA. K+ 5.9 (istat), BUN 56, creat 12.8. Hb 10.9. CXR vasc congestion, no pulm edema. In ED pt rec'd 5 gm lokelma , 1 amp ca gluconate. Pt was admitted. We are asked to see for dialysis.    Pt seen in room. Denies any SOB, leg swelling. Says he watches his fluids very well and has never had leg swelling. No n/v/d, abd pain.    ROS - denies CP, no joint pain, no HA, no blurry vision, no rash  Past Medical History  Past Medical History:  Diagnosis Date   Allergy     Anemia    CAD (coronary artery disease)    pci to LAD 10/09; stable CAD by cath 05/31/08; myoview 04/11/11- no ishcemia, EF 67%; echo 05/15/09- EF>55%, mod calcification of the aortic valve leaflets   CAD S/P percutaneous coronary angioplasty 05/08/2008   LAD PCI with DES 2009- Myoview low risk 2013   CHF (congestive heart failure) (HCC) 09/07/2019   Chronic back pain    Chronic kidney disease    Cystitis, radiation 12/01/2013   Dyspnea    ED (erectile dysfunction)    Elevated serum creatinine 12/14/2015   Essential hypertension, benign    Hematuria 12/01/2013   History of blood transfusion    Hyperlipidemia    Ileus (HCC) 12/14/2015   Insulin  dependent type 2 diabetes mellitus, controlled (HCC)    Malignant neoplasm of prostate (HCC) 11/26/2013   Multiple rib fractures    left 9th, 10th, and 11th posterior rib fractures S/P fall 12/09/2015/notes 12/12/2015   Osteoarthritis of right knee 03/04/2018   Personal history of COVID-19  09/05/2021   PREMATURE VENTRICULAR CONTRACTIONS 05/08/2008   Qualifier: Diagnosis of  By: Kelsie, MD, James     Prostate cancer [facility]    s/p   Rectal bleeding 12/23/2012   Evaluated by Dr Rollin GI patient with history of radiation proctitis. Patient due for colonoscopy on 04/2014    Type II or unspecified type diabetes mellitus without mention of complication, not stated as uncontrolled    Ventricular tachycardia (HCC)    resuscitated; monitor 05/2008; attempted T ablation 2/10- aborted due to inappropriate substrate   Past Surgical History  Past Surgical History:  Procedure Laterality Date   A/V SHUNT INTERVENTION [name] 08/08/2023   Procedure: A/V SHUNT INTERVENTION;  Surgeon: Tobie Gordy POUR, MD;  Location: [facility] INVASIVE CV LAB;  Service: Cardiovascular;  Laterality: [name];   A/V SHUNT INTERVENTION [name] 09/03/2023   Procedure: A/V SHUNT INTERVENTION;  Surgeon: Lanis Fonda BRAVO, MD;  Location: [facility] PV LAB;  Service: Cardiovascular;  Laterality: [name];   AV FISTULA PLACEMENT Left 12/02/2022   Procedure: LEFT ARM BRACHIOCEPHALIC ARTERIOVENOUS (AV) FISTULA CREATION;  Surgeon: Gretta Lonni PARAS, MD;  Location: [facility] OR;  Service: Vascular;  Laterality: Left;   AV FISTULA PLACEMENT Left 01/22/2023   Procedure: LEFT ARM BRACHIOBASILISC ARTERIOVENOUS (AV) FISTULA CREATION;  Surgeon: Gretta Lonni PARAS, MD;  Location: [facility] OR;  Service: Vascular;  Laterality: Left;  BASCILIC VEIN TRANSPOSITION Left 05/02/2023   Procedure: Insertion of LEFT Arm Arterio-venous Gortex Graft;  Surgeon: Gretta Lonni PARAS, MD;  Location: [facility] OR;  Service: Vascular;  Laterality: Left;   CARDIAC CATHETERIZATION  05/31/08   EF 55-60%, stable lesions, medical therapy   COLECTOMY  '80's   polyps   CORONARY ANGIOPLASTY WITH STENT PLACEMENT  01/19/08   PCI stent to mid LAD with Promus DES 27.5x28   CYSTOSCOPY WITH DIRECT VISION INTERNAL URETHROTOMY [name] 03/12/2021   Procedure: cystoscoopy withdirect vision internal urethrotomy optilum  urethral dilation     ;  Surgeon: Carolee Sherwood JONETTA DOUGLAS, MD;  Location: [facility] ORS;  Service: Urology;  Laterality: [name];  RERQUESTING 45 MINS   DIALYSIS/PERMA CATHETER INSERTION [name] 04/10/2023   Procedure: DIALYSIS/PERMA CATHETER INSERTION;  Surgeon: Tobie Gordy POUR, MD;  Location: [facility] INVASIVE CV LAB;  Service: Cardiovascular;  Laterality: [name];   DIALYSIS/PERMA CATHETER INSERTION [name] 09/05/2023   Procedure: DIALYSIS/PERMA CATHETER INSERTION;  Surgeon: Serene Gaile ORN, MD;  Location: [facility] PV LAB;  Service: Cardiovascular;  Laterality: [name];   INSERTION OF ARTERIOVENOUS (AV) ARTEGRAFT ARM Right 09/24/2023   Procedure: INSERTION, GRAFT, ARTERIOVENOUS, UPPER EXTREMITY;  Surgeon: Magda Debby SAILOR, MD;  Location: [facility] OR;  Service: Vascular;  Laterality: Right;   IR GENERIC HISTORICAL  01/03/2016   IR THORACENTESIS ASP PLEURAL SPACE W/IMG GUIDE 01/03/2016 [facility]-INTERV RAD   IR REMOVAL TUN CV CATH W/O FL  11/07/2023   KNEE ARTHROPLASTY Right 03/04/2018   Procedure: COMPUTER ASSISTED TOTAL KNEE ARTHROPLASTY;  Surgeon: Fidel Rogue, MD;  Location: [facility] ORS;  Service: Orthopedics;  Laterality: Right;   PERIPHERAL VASCULAR THROMBECTOMY  09/03/2023   Procedure: PERIPHERAL VASCULAR THROMBECTOMY;  Surgeon: Lanis Fonda BRAVO, MD;  Location: [facility] PV LAB;  Service: Cardiovascular;;  avf   PTCA  01/2008   TRANSURETHRAL RESECTION OF BLADDER TUMOR [name] 11/10/2020   Procedure: PHYLLIS BOYCE CHARLESETTA GEROLD;  Surgeon: Sherrilee Belvie CROME, MD;  Location: [facility] ORS;  Service: Urology;  Laterality: [name];   TRANSURETHRAL RESECTION OF BLADDER TUMOR [name] 08/12/2022   Procedure: TRANSURETHRAL RESECTION OF BLADDER TUMOR (TURBT);  Surgeon: Carolee Sherwood JONETTA DOUGLAS, MD;  Location: [facility] ORS;  Service: Urology;  Laterality: [name];   TRANSURETHRAL RESECTION OF BLADDER TUMOR WITH MITOMYCIN -C [name] 09/17/2021   Procedure: TRANSURETHRAL RESECTION OF BLADDER TUMOR WITH GEMCITABINE ;  Surgeon: Carolee Sherwood JONETTA DOUGLAS, MD;  Location: [facility] ORS;  Service: Urology;  Laterality: [name];    VENOUS STENT  09/03/2023   Procedure: VENOUS STENT;  Surgeon: Lanis Fonda BRAVO, MD;  Location: [facility] LAB;  Service: Cardiovascular;;  avf   Family History  Family History  Problem Relation Age of Onset   Diabetes Mother    Heart attack Mother    Leukemia Father    Heart disease Sister    Heart disease Brother    Diabetes Brother        pancreatic cancer   Social History  reports that he quit smoking about 21 years ago. His smoking use included cigarettes. He has never used smokeless tobacco. He reports that he does not currently use drugs after having used the following drugs: Marijuana. He reports that he does not drink alcohol . Allergies No Known Allergies Home medications Prior to Admission medications   Medication Sig Start Date End Date Taking? Authorizing Provider  acetaminophen  (TYLENOL ) 500 MG tablet Take 1,000 mg by mouth in the morning.    [provider]  amLODipine  (NORVASC ) 10 MG tablet TAKE 1 TABLET EVERY DAY (APPOINTMENT IS NEEDED FOR  REFILLS) 10/17/23   Court Dorn PARAS, MD  atorvastatin  (LIPITOR) 10 MG tablet Take 1 tablet (10 mg total) by mouth daily. 11/26/23   Court Dorn PARAS, MD  B Complex-C-Folic Acid (DIALYVITE 800) 0.8 MG TABS Take 1 tablet by mouth daily. 06/12/23   [provider]  Blood Glucose Monitoring Suppl (ACCU-CHEK AVIVA PLUS) w/Device KIT Test blood sugar 3 times daily. Dx code: E11.22 04/03/16   Levora Reyes SAUNDERS, MD  Blood Pressure Monitoring (BLOOD PRESS MONITOR/M-L CUFF) MISC 1 application by Does not apply route daily. 04/26/20   Levora Reyes SAUNDERS, MD  calcitRIOL  (ROCALTROL ) 0.25 MCG capsule Take 0.25 mcg by mouth Every Tuesday,Thursday,and Saturday with dialysis.    [provider]  carvedilol  (COREG ) 25 MG tablet TAKE 1 TABLET TWICE DAILY. KEEP OFFICE VISIT 11/07/23   Court Dorn PARAS, MD  Cholecalciferol  (VITAMIN D -3 PO) Take 1 tablet by mouth daily.    [provider]  Continuous Blood Gluc Sensor (FREESTYLE  LIBRE SENSOR SYSTEM) MISC 1 application by Does not apply route daily. 06/30/20   Levora Reyes SAUNDERS, MD  Continuous Glucose Monitor Sup KIT 1 kit by Does not apply route as directed. 04/26/20   Levora Reyes SAUNDERS, MD  glucose blood (ACCU-CHEK AVIVA PLUS) test strip Test blood sugar 3 times daily. Dx code: E11.22 04/03/16   Levora Reyes SAUNDERS, MD  insulin  NPH-regular Human (NOVOLIN  70/30) (70-30) 100 UNIT/ML injection Inject 8 Units into the skin 2 (two) times daily with a meal. 02/10/23   Arrien, Elidia Sieving, MD  Insulin  Syringe-Needle U-100 (B-D INS SYR HALF-UNIT .3CC/31G) 31G X 5/16 0.3 ML MISC Use daily at bedtime to inject insulin . Dx code: 250.00 06/21/13   Levora Reyes SAUNDERS, MD  isosorbide  mononitrate (IMDUR ) 60 MG 24 hr tablet TAKE 2 TABLETS EVERY DAY 10/17/23   Berry, Jonathan J, MD  Lancets (ACCU-CHEK MULTICLIX) lancets Test blood sugar 3 times daily. Dx code: E11.22 04/03/16   Levora Reyes SAUNDERS, MD  lidocaine  (LIDODERM ) 5 % Place 1 patch onto the skin daily. Remove & Discard patch within 12 hours or as directed by MD Patient taking differently: Place 1 patch onto the skin daily as needed (Pain). Remove & Discard patch within 12 hours or as directed by MD 09/02/23   Melvenia Motto, MD  methocarbamol  (ROBAXIN ) 500 MG tablet Take 1 tablet (500 mg total) by mouth every 8 (eight) hours as needed for muscle spasms. 09/02/23   Melvenia Motto, MD  oxyCODONE -acetaminophen  (PERCOCET) 5-325 MG tablet Take 1 tablet by mouth every 4 (four) hours as needed for severe pain (pain score 7-10). 09/24/23 09/23/24  Magda Debby SAILOR, MD  oxyCODONE -acetaminophen  (PERCOCET/ROXICET) 5-325 MG tablet Take 1 tablet by mouth every 8 (eight) hours as needed. 09/12/23   [provider]  Polyethyl Glycol-Propyl Glycol (SYSTANE) 0.4-0.3 % SOLN Place 1 drop into both eyes as needed (dry eyes).    [provider]  tamsulosin  (FLOMAX ) 0.4 MG CAPS capsule Take 0.4 mg by mouth daily. 01/29/21   [provider]   torsemide  (DEMADEX ) 20 MG tablet Take 2 tablets (40 mg total) by mouth 2 (two) times daily. In case of weight gain 2 to 3 lbs in 24 hrs or 5 lbs in 7 days, take one extra tablet a day in the morning, until weight back to baseline. Patient taking differently: Take 20 mg by mouth 2 (two) times daily. In case of weight gain 2 to 3 lbs in 24 hrs or 5 lbs in 7 days, take one  extra tablet a day in the morning, until weight back to baseline. 02/10/23   Noralee Elidia Sieving, MD     Vitals:   12/09/23 9158 12/09/23 0845 12/09/23 1212 12/09/23 1300  BP: (!) 123/46 (!) 144/65 (!) 166/74 (!) 159/71  Pulse: 65 70 (!) 59 72  Resp: 18  15 19   Temp: 97.6 F (36.4 C)  97.6 F (36.4 C)   TempSrc:   Oral   SpO2: 99% 100% 100% 100%  Weight:      Height:       Exam Gen alert, no distress No rash, cyanosis or gangrene Sclera anicteric, throat clear  No jvd or bruits Chest clear bilat to bases, no rales/ wheezing RRR no MRG Abd soft ntnd no mass or ascites +bs GU deferred MS no joint effusions or deformity Ext no LE or UE edema, no other edema Neuro is alert, Ox 3 , nf   RUA AVG- no bruit   Home bp meds: Norvasc  10 Coreg  25 bid Demadex  20mg  bid prn    OP HD: SW TTS started jan 2025 4h  B400  91.5kg   2K bath  AVG   Heparin  5000 Last OP HD 8/26, post wt 91.5kg Mircera 60 mcg q 2 wks, last 8/26, due 9/09  Assessment/ Plan: Clotted AV graft: placed in June 2025, started using in early aug 2025. Hx of 2 LUE AVF/ AVG's in 2024. IR consulted for [facility].  ESRD: on HD TTS. Last HD 1 week ago. Plan HD tonight after [facility] placement.  HTN: BP's looks reasonable, cont home meds CCB and BB.  Volume: up about 1 kg, UF same w/ HD Anemia of esrd: Hb here 10- 11 range, will follow.        Myer Fret  MD [facility] 12/09/2023, 1:57 PM  Recent Labs  Lab 12/09/23 0840 12/09/23 0914  HGB 10.3* 10.9*  CREATININE  --  12.80*  K  --  5.9*   Inpatient medications:  heparin   5,000 Units Subcutaneous Q8H

## 2023-12-09 NOTE — Hospital Course (Signed)
 [name] is a 89 y.o. male with pertinent PMH of ESRD on HD TTS, CAD, HFpEF, HTN, and T2DM who presented with right upper extremity AV fistula thrombus and is admitted for placement of tunneled dialysis catheter and hemodialysis.  Due to fistula thrombus was confirmed on ultrasound Doppler here and vascular surgery did not recommend any immediate intervention.  IR was consulted for placement of a tunneled dialysis catheter which they placed on 12/09/2023 and he received dialysis early morning on 12/10/2023.  Hyperkalemia peaked at 6.3 without any EKG changes.  After dialysis it was within normal limits.  Prior to dialysis he received Lokelma  and calcium  gluconate.  The morning after dialysis he was feeling well and ready to go home.  Dialysis catheter appeared clean and worked well during dialysis.  He will resume his outpatient dialysis schedule and follow-up with vascular surgery outpatient.

## 2023-12-09 NOTE — ED Notes (Signed)
 Transport called for patient to be taken up

## 2023-12-09 NOTE — Progress Notes (Signed)
 Pt receives out-pt HD at [facility] on TTS 7:05 am chair time. Will assist as needed.   Randine Mungo Dialysis Navigator [phone]

## 2023-12-09 NOTE — Plan of Care (Signed)

## 2023-12-09 NOTE — Progress Notes (Signed)
 Right upper arm AVG study  has been completed. Refer to [facility] under chart review to view preliminary results.   12/09/2023  12:12 PM Sandrine Bloodsworth, Ricka BIRCH

## 2023-12-09 NOTE — ED Triage Notes (Signed)
 Pt states he hasn't had dialysis since last Tuesday and dialysis port is clogged. C/O CP and SHOB. Denies n/v.

## 2023-12-09 NOTE — ED Notes (Signed)
 CCMD called.

## 2023-12-10 ENCOUNTER — Other Ambulatory Visit (HOSPITAL_COMMUNITY): Payer: Self-pay

## 2023-12-10 ENCOUNTER — Encounter (HOSPITAL_COMMUNITY): Payer: Self-pay

## 2023-12-10 LAB — GLUCOSE, CAPILLARY
Glucose-Capillary: 80 mg/dL (ref 70–99)
Glucose-Capillary: 174 mg/dL — ABNORMAL HIGH (ref 70–99)
Glucose-Capillary: 105 mg/dL — ABNORMAL HIGH (ref 70–99)

## 2023-12-10 LAB — RENAL FUNCTION PANEL
Albumin: 2.8 g/dL — ABNORMAL LOW (ref 3.5–5.0)
Anion gap: 16 — ABNORMAL HIGH (ref 5–15)
BUN: 30 mg/dL — ABNORMAL HIGH (ref 8–23)
CO2: 22 mmol/L (ref 22–32)
Calcium: 8.7 mg/dL — ABNORMAL LOW (ref 8.9–10.3)
Chloride: 100 mmol/L (ref 98–111)
Creatinine, Ser: 6.47 mg/dL — ABNORMAL HIGH (ref 0.61–1.24)
GFR, Estimated: 8 mL/min — ABNORMAL LOW (ref >60)
Glucose, Bld: 80 mg/dL (ref 70–99)
Phosphorus: 4.3 mg/dL (ref 2.5–4.6)
Potassium: 4.4 mmol/L (ref 3.5–5.1)
Sodium: 138 mmol/L (ref 135–145)

## 2023-12-10 LAB — CBC
HCT: 35 % — ABNORMAL LOW (ref 39.0–52.0)
Hemoglobin: 11.3 g/dL — ABNORMAL LOW (ref 13.0–17.0)
MCH: 28.1 pg (ref 26.0–34.0)
MCHC: 32.3 g/dL (ref 30.0–36.0)
MCV: 87.1 fL (ref 80.0–100.0)
Platelets: 219 K/uL (ref 150–400)
RBC: 4.02 MIL/uL — ABNORMAL LOW (ref 4.22–5.81)
RDW: 16.4 % — ABNORMAL HIGH (ref 11.5–15.5)
WBC: 9.6 K/uL (ref 4.0–10.5)
nRBC: 0 % (ref 0.0–0.2)

## 2023-12-10 MED ORDER — MELATONIN 3 MG PO TABS
3.0000 mg | ORAL_TABLET | Freq: Every day | ORAL | 0 refills | Status: AC
  Filled 2023-12-10: qty 30, 30d supply, fill #0

## 2023-12-10 MED ORDER — MELATONIN 3 MG PO TABS
3.0000 mg | ORAL_TABLET | Freq: Every day | ORAL | Status: DC
  Administered 2023-12-10: 3 mg via ORAL
  Filled 2023-12-10 (×2): qty 1

## 2023-12-10 NOTE — TOC Transition Note (Signed)
 Transition of Care [facility]) - Discharge Note   Patient Details  Name: [name] MRN: [id number] Date of Birth: 10-28-1933  Transition of Care [facility]) CM/SW Contact:  Lendia Dais, LCSWA Phone Number: 12/10/2023, 2:27 PM   Clinical Narrative:   Barriers have been resolved and pt will discharge home with self-care and will be transported by spouse.  No further ICM needs.    Final next level of care: Home/Self Care Barriers to Discharge: Continued Medical Work up   Patient Goals and CMS Choice Patient states their goals for this hospitalization and ongoing recovery are:: Return Home   Choice offered to / list presented to : NA      Discharge Placement                Patient to be transferred to facility by: Boby Remington Name of family member notified: Bettey Remington Patient and family notified of of transfer: 12/10/23  Discharge Plan and Services Additional resources added to the After Visit Summary for   In-house Referral: Clinical Social Work                                   Social Drivers of Health (SDOH) Interventions SDOH Screenings   Food Insecurity: No Food Insecurity (12/09/2023)  Housing: Low Risk  (12/09/2023)  Transportation Needs: No Transportation Needs (12/09/2023)  Utilities: Not At Risk (12/09/2023)  Alcohol  Screen: Low Risk  (12/16/2019)  Depression (PHQ2-9): Low Risk  (06/30/2020)  Social Connections: Patient Declined (12/09/2023)  Tobacco Use: Medium Risk (12/09/2023)     Readmission Risk Interventions    02/10/2023   12:31 PM  Readmission Risk Prevention Plan  Transportation Screening Complete  PCP or Specialist Appt within 3-5 Days Complete  HRI or Home Care Consult Complete  Palliative Care Screening Not Applicable  Medication Review (RN Care Manager) Complete

## 2023-12-10 NOTE — TOC Transition Note (Signed)
 Transition of Care [facility]) - Discharge Note   Patient Details  Name: [name] MRN: [id number] Date of Birth: [birth year]  [facility]) CM/SW Contact:  Tom-Johnson, Anaissa Macfadden Daphne, RN Phone Number: 12/10/2023, 2:52 PM   Clinical Narrative:     Patient is scheduled for discharge today.  Hospital f/u and discharge instructions on AVS. Prescriptions sent to [facility] pharmacy and patient will receive meds prior discharge. No ICM needs or recommendations noted. Wife, [name] at bedside and will transport at discharge.  No further ICM needs noted.      Final next level of care: Home/Self Care Barriers to Discharge: Continued Medical Work up   Patient Goals and CMS Choice Patient states their goals for this hospitalization and ongoing recovery are:: Return Home   Choice offered to / list presented to : NA      Discharge Placement                Patient to be transferred to facility by: Boby Remington Name of family member notified: Bettey Remington Patient and family notified of of transfer: 12/10/23  Discharge Plan and Services Additional resources added to the After Visit Summary for   In-house Referral: Clinical Social Work                                   Social Drivers of Health (SDOH) Interventions SDOH Screenings   Food Insecurity: No Food Insecurity (12/09/2023)  Housing: Low Risk  (12/09/2023)  Transportation Needs: No Transportation Needs (12/09/2023)  Utilities: Not At Risk (12/09/2023)  Alcohol  Screen: Low Risk  (12/16/2019)  Depression (PHQ2-9): Low Risk  (06/30/2020)  Social Connections: Patient Declined (12/09/2023)  Tobacco Use: Medium Risk (12/09/2023)     Readmission Risk Interventions    02/10/2023   12:31 PM  Readmission Risk Prevention Plan  Transportation Screening Complete  PCP or Specialist Appt within 3-5 Days Complete  HRI or Home Care Consult Complete  Palliative Care Screening Not Applicable  Medication Review (RN Care  Manager) Complete

## 2023-12-10 NOTE — Discharge Instructions (Signed)
 Zahi LITTIE Remington,  You were recently admitted to [facility] for placement of a tunneled dialysis catheter. Please continue to follow with your nephrologists and PCP.   You should seek further medical care if you have any bleeding, pain, or drainage from your catheter.  We recommend that you see your primary care doctor in about a week to make sure that you continue to improve. We are so glad that you are feeling better.  Sincerely, Fairy Pool, DO

## 2023-12-10 NOTE — Progress Notes (Signed)
 [facility] Progress Note   Subjective:   Seen in room. S/p TDC and HD last night. K better today. No CP/dyspnea. Per ER notes, vascular surgery didn't think AVG was salvageable, looks like VVS consult is pending, but he prefers to leave and deal with it as an outpatient - will send message to hospitalist.  Objective Vitals:   12/10/23 0250 12/10/23 0326 12/10/23 0639 12/10/23 0804  BP: (!) 148/66 (!) 164/74 (!) 155/66 (!) 164/68  Pulse: 77 79 79 75  Resp: 12 18 18 18   Temp: 98.5 F (36.9 C) 98.3 F (36.8 C) 98.3 F (36.8 C) 97.8 F (36.6 C)  TempSrc: Oral Oral Oral Oral  SpO2: 100% 100% 100% 99%  Weight:      Height:       Physical Exam General: Well appearing, elderly man, NAD Heart: RRR Lungs: CTA anteriorly Abdomen: soft Extremities: no LE edema Dialysis Access: new TDC, RUE AVG without bruit  Additional Objective Labs: Basic Metabolic Panel: Recent Labs  Lab 12/09/23 0914 12/09/23 1750 12/10/23 0434  NA 138 138 138  K 5.9* 6.3* 4.4  CL 112* 108 100  CO2  --  15* 22  GLUCOSE 89 105* 80  BUN 56* 65* 30*  CREATININE 12.80* 11.52* 6.47*  CALCIUM   --  8.9 8.7*  PHOS  --  5.9* 4.3   Liver Function Tests: Recent Labs  Lab 12/09/23 1750 12/10/23 0434  ALBUMIN 2.8* 2.8*   CBC: Recent Labs  Lab 12/09/23 0840 12/09/23 0914 12/10/23 0434  WBC 7.2  --  9.6  HGB 10.3* 10.9* 11.3*  HCT 33.2* 32.0* 35.0*  MCV 91.2  --  87.1  PLT 234  --  219   Studies/Results: IR TUNNELED CENTRAL VENOUS CATH PLC W IMG Result Date: 12/09/2023 INDICATION: 89 year old male with history of end-stage renal disease requiring replacement of tunneled hemodialysis catheter. EXAM: TUNNELED CENTRAL VENOUS HEMODIALYSIS CATHETER PLACEMENT WITH ULTRASOUND AND FLUOROSCOPIC GUIDANCE MEDICATIONS: Ancef  2 gm IV . The antibiotic was given in an appropriate time interval prior to skin puncture. ANESTHESIA/SEDATION: Moderate (conscious) sedation was employed during this procedure.  A total of Versed  2 mg and Fentanyl  100 mcg was administered intravenously. Moderate Sedation Time: 10 minutes. The patient's level of consciousness and vital signs were monitored continuously by radiology nursing throughout the procedure under my direct supervision. FLUOROSCOPY TIME:  Four mGy reference air kerma COMPLICATIONS: None immediate. PROCEDURE: Informed written consent was obtained from the patient after a discussion of the risks, benefits, and alternatives to treatment. Questions regarding the procedure were encouraged and answered. The right neck and chest were prepped with chlorhexidine  in a sterile fashion, and a sterile drape was applied covering the operative field. Maximum barrier sterile technique with sterile gowns and gloves were used for the procedure. A timeout was performed prior to the initiation of the procedure. After creating a small venotomy incision, a 21 gauge micropuncture kit was utilized to access the internal jugular vein. Real-time ultrasound guidance was utilized for vascular access including the acquisition of a permanent ultrasound image documenting patency of the accessed vessel. A Rosen wire was advanced to the level of the IVC and the micropuncture sheath was exchanged for an 8 Fr dilator. A 14.5 French tunneled hemodialysis catheter measuring 19 cm from tip to cuff was tunneled in a retrograde fashion from the anterior chest wall to the venotomy incision. Serial dilation was then performed an a peel-away sheath was placed. The catheter was then placed through the peel-away sheath  with the catheter tip ultimately positioned within the right atrium. Final catheter positioning was confirmed and documented with a spot radiographic image. The catheter aspirates and flushes normally. The catheter was flushed with appropriate volume heparin  dwells. The catheter exit site was secured with a 0-Silk retention suture. The venotomy incision was closed with Dermabond. Sterile dressings  were applied. The patient tolerated the procedure well without immediate post procedural complication. IMPRESSION: Successful placement of 19 cm tip to cuff tunneled hemodialysis catheter via the right internal jugular vein with catheter tip terminating within the right atrium. The catheter is ready for immediate use. Ester Sides, MD [facility] Radiology Electronically Signed   By: Ester Sides M.D.   On: 12/09/2023 22:18   VAS US  DUPLEX DIALYSIS ACCESS (AVF, AVG) Result Date: 12/09/2023  UPPER EXTREMITY DUPLEX STUDY Patient Name:  ADRIC WREDE  Date of Exam:   12/09/2023 Medical Rec #: [id number]        Accession #:    [id number] Date of Birth: [birth year]       Patient Gender: M Patient Age:   89 years Exam Location:  [facility] Procedure:      VAS US  DUPLEX DIALYSIS ACCESS (AVF, AVG) Referring Phys: FAIRY STEVENS --------------------------------------------------------------------------------  Indications: Occluded AVG right upper arm.  Risk Factors:  Hypertension, hyperlipidemia, Diabetes, coronary artery disease. Other Factors: Two failed left arm AVFs - Brachiocephalic and Brachialbasilic                2024. Performing Technologist: Ricka Sturdivant-Jones RDMS, RVT  Examination Guidelines: A complete evaluation includes B-mode imaging, spectral Doppler, color Doppler, and power Doppler as needed of all accessible portions of each vessel. Bilateral testing is considered an integral part of a complete examination. Limited examinations for reoccurring indications may be performed as noted.  Right Graft #1: +------------------+--------+--------+--------+--------+                   PSV cm/sStenosisWaveformComments +------------------+--------+--------+--------+--------+ Inflow                                             +------------------+--------+--------+--------+--------+ Prox Anastomosis          occluded                  +------------------+--------+--------+--------+--------+ Proximal Graft            occluded                 +------------------+--------+--------+--------+--------+ Mid Graft                 occluded                 +------------------+--------+--------+--------+--------+ Distal Graft              occluded                 +------------------+--------+--------+--------+--------+ Distal Anastomosis        occluded                 +------------------+--------+--------+--------+--------+ Outflow                                            +------------------+--------+--------+--------+--------+   Summary:  Right: Occluded right arteriovenous graft upper arm. Electronically  signed by Debby Robertson on 12/09/2023 at 4:46:33 PM.    Final    DG Chest 2 View Result Date: 12/09/2023 CLINICAL DATA:  Delayed dialysis. EXAM: CHEST - 2 VIEW COMPARISON:  Chest radiograph 02/08/2023 FINDINGS: Normal cardiac silhouette. Central venous congestion. No overt pulmonary edema. No pleural fluid. Pneumothorax or pneumonia IMPRESSION: Central venous congestion without overt pulmonary edema. Electronically Signed   By: Jackquline Boxer M.D.   On: 12/09/2023 09:47   Medications:   ceFAZolin  (ANCEF ) IV      acetaminophen   1,000 mg Oral q AM   amLODipine   10 mg Oral Daily   atorvastatin   10 mg Oral Daily   carvedilol   25 mg Oral BID WC   Chlorhexidine  Gluconate Cloth  6 each Topical Q0600   heparin   5,000 Units Subcutaneous Q8H   insulin  aspart  0-5 Units Subcutaneous QHS   insulin  aspart  0-9 Units Subcutaneous TID WC   isosorbide  mononitrate  120 mg Oral Daily   melatonin  3 mg Oral QHS   multivitamin  1 tablet Oral QHS   tamsulosin   0.4 mg Oral Daily   torsemide   20 mg Oral Daily    Dialysis Orders TTS - AF 4hr, BFR 400, EDW 91.5kg, 2K bath, AVG, heparin  5000 bolus  Assessment/Plan: Clotted RUE AVG: Per ED note, vascular surgery felt non-salvageable. TDC placed 9/2 and dialyzed  after. ESRD: Usual TTS schedule - next HD tomorrow, ok to be as outpatient. HTN/volume: BP high, continue home meds, should come down with further HD. Anemia of ESRD: Hgb 11.3, no ESA for now Secondary HPTH: Ca/Phos ok - continue home meds.   Izetta Boehringer, PA-C 12/10/2023, 11:00 AM  [employer]

## 2023-12-10 NOTE — Progress Notes (Signed)
 D/C order noted. Contacted [facility] to be advised of pt's d/c today and that pt should resume care tomorrow.   Randine Mungo Dialysis Navigator [phone]

## 2023-12-10 NOTE — Discharge Planning (Signed)
 [facility] Patient Dialysis Discharge Orders - [facility] CLINIC: [facility]  Patient's name: [name] Admit/DC Dates: 12/09/2023 - 12/10/23  DISCHARGE DIAGNOSES: Clotted AVG  -> VVS consulted by ED, felt unlikely salvageable, TDC placed by IR Hyperkalemia  HD ORDER CHANGES: Heparin  change: no EDW Change: no Bath Change: no  ANEMIA MANAGEMENT: Aranesp: Given: no   ESA dose for discharge: Per protocol  IV Iron  dose at discharge: Per protocol Transfusion: Given: no  BONE/MINERAL MEDICATIONS: Hectorol/Calcitriol  change: no Sensipar/Parsabiv change: no  ACCESS INTERVENTION/CHANGE: YES - new TDC placed 9/2 by IR Details:   RECENT LABS: Recent Labs  Lab 12/10/23 0434  HGB 11.3*  NA 138  K 4.4  CALCIUM  8.7*  PHOS 4.3  ALBUMIN 2.8*    IV ANTIBIOTICS: no Details:  OTHER ANTICOAGULATION: no   OTHER/APPTS/LAB ORDERS: - F/u with VVS as outpatient to see if AVG can be revision or different plan  D/C Meds to be reconciled by nurse after every discharge.  Completed By: Izetta Boehringer, PA-C [facility] Pager [phone]   Reviewed by: MD:______ RN_______

## 2023-12-10 NOTE — Progress Notes (Signed)
   12/10/23 0250  Vitals  Temp 98.5 F (36.9 C)  Temp Source Oral  BP (!) 148/66  MAP (mmHg) 91  BP Location Right Arm  BP Method Automatic  Patient Position (if appropriate) Lying  Pulse Rate 77  Pulse Rate Source Monitor  ECG Heart Rate 76  Resp 12  Oxygen Therapy  SpO2 100 %  During Treatment Monitoring  Blood Flow Rate (mL/min) 0 mL/min  Arterial Pressure (mmHg) -14.54 mmHg  Venous Pressure (mmHg) -10.7 mmHg  TMP (mmHg) 15.75 mmHg  Ultrafiltration Rate (mL/min) 492 mL/min  Dialysate Flow Rate (mL/min) 300 ml/min  Dialysate Potassium Concentration 2  Dialysate Calcium  Concentration 2.5  Duration of HD Treatment -hour(s) 3.5 hour(s)  Cumulative Fluid Removed (mL) per Treatment  1800.07  HD Safety Checks Performed Yes  Intra-Hemodialysis Comments Tx completed  Post Treatment  Dialyzer Clearance Lightly streaked  Liters Processed 72.5  Fluid Removed (mL) 1800 mL  Tolerated HD Treatment Yes  Hemodialysis Catheter Right Internal jugular Double lumen Permanent (Tunneled)  Placement Date/Time: 12/09/23 1631   Serial / Lot #: [id number]  Expiration Date: 07/06/28  Time Out: Correct patient;Correct site;Correct procedure  Maximum sterile barrier precautions: Hand hygiene;Cap;Mask;Sterile gown;Sterile gloves;Large sterile s...  Site Condition No complications  Blue Lumen Status Flushed;Heparin  locked  Red Lumen Status Flushed;Heparin  locked  Catheter fill solution Heparin  1000 units/ml  Catheter fill volume (Arterial) 1.6 cc  Catheter fill volume (Venous) 1.6  Dressing Type Transparent  Dressing Status Antimicrobial disc/dressing in place  Interventions New dressing  Drainage Description None  Dressing Change Due 12/15/23  Post treatment catheter status Capped and Clamped   Net UF 1800 ml. Pt stable.

## 2023-12-10 NOTE — Care Management Obs Status (Signed)
 MEDICARE OBSERVATION STATUS NOTIFICATION   Patient Details  Name: [name] MRN: [id number] Date of Birth: 09/20/1933   Medicare Observation Status Notification Given:     Obs notice signed and copy given  Claretta Deed 12/10/2023, 2:21 PM

## 2023-12-10 NOTE — Discharge Summary (Signed)
 Name: [name] MRN: [id number] DOB: [birth year] 89 y.o. PCP: Teresa Thersia Jansky, MD  Date of Admission: 12/09/2023  8:34 AM Date of Discharge: 12/10/2023  Attending Physician: Dr. Reyes Acosta  Discharge Diagnosis: Principal Problem:   AV fistula thrombosis (HCC) Active Problems:   Type 2 diabetes mellitus with hyperlipidemia (HCC)   Essential hypertension   ESRD (end stage renal disease) (HCC)   Hyperkalemia   Discharge Medications: Allergies as of 12/10/2023   No Known Allergies      Medication List     STOP taking these medications    methocarbamol  500 MG tablet Commonly known as: ROBAXIN    oxyCODONE -acetaminophen  5-325 MG tablet Commonly known as: Percocet       TAKE these medications    Accu-Chek Aviva Plus w/Device Kit Test blood sugar 3 times daily. Dx code: E11.22   accu-chek multiclix lancets Test blood sugar 3 times daily. Dx code: E11.22   acetaminophen  500 MG tablet Commonly known as: TYLENOL  Take 1,000 mg by mouth in the morning.   amLODipine  10 MG tablet Commonly known as: NORVASC  TAKE 1 TABLET EVERY DAY (APPOINTMENT IS NEEDED FOR REFILLS)   atorvastatin  10 MG tablet Commonly known as: LIPITOR Take 1 tablet (10 mg total) by mouth daily.   Blood Press Monitor/M-L Cuff Misc 1 application by Does not apply route daily.   calcitRIOL  0.25 MCG capsule Commonly known as: ROCALTROL  Take 0.25 mcg by mouth Every Tuesday,Thursday,and Saturday with dialysis.   carvedilol  25 MG tablet Commonly known as: COREG  TAKE 1 TABLET TWICE DAILY. KEEP OFFICE VISIT   Continuous Glucose Monitor Sup Kit 1 kit by Does not apply route as directed.   Dialyvite 800 0.8 MG Tabs Take 1 tablet by mouth daily.   FreeStyle [employer] Misc 1 application by Does not apply route daily.   glucose blood test strip Commonly known as: Accu-Chek Aviva Plus Test blood sugar 3 times daily. Dx code: E11.22   Insulin  Syringe-Needle U-100 31G X 5/16  0.3 ML Misc Commonly known as: B-D INS SYR HALF-UNIT .3CC/31G Use daily at bedtime to inject insulin . Dx code: 250.00   isosorbide  mononitrate 60 MG 24 hr tablet Commonly known as: IMDUR  TAKE 2 TABLETS EVERY DAY   lidocaine  5 % Commonly known as: Lidoderm  Place 1 patch onto the skin daily. Remove & Discard patch within 12 hours or as directed by MD What changed:  when to take this reasons to take this   melatonin 3 MG Tabs tablet Take 1 tablet (3 mg total) by mouth at bedtime.   NovoLIN  70/30 (70-30) 100 UNIT/ML injection Generic drug: insulin  NPH-regular Human Inject 8 Units into the skin 2 (two) times daily with a meal.   Systane 0.4-0.3 % Soln Generic drug: Polyethyl Glycol-Propyl Glycol Place 1 drop into both eyes as needed (dry eyes).   tamsulosin  0.4 MG Caps capsule Commonly known as: FLOMAX  Take 0.4 mg by mouth daily.   torsemide  20 MG tablet Commonly known as: DEMADEX  Take 2 tablets (40 mg total) by mouth 2 (two) times daily. In case of weight gain 2 to 3 lbs in 24 hrs or 5 lbs in 7 days, take one extra tablet a day in the morning, until weight back to baseline. What changed: how much to take   VITAMIN D -3 PO Take 1 tablet by mouth daily.        Disposition and follow-up:   Mr.[name] was discharged from [facility] in Good condition.  At the hospital follow up visit please address:  1.  Follow-up:  a.  Reevaluate tunnel dialysis catheter for any signs of infection.  Also ensure that he can get follow-up with vascular surgery for further evaluation of more permanent dialysis access.   Follow-up Appointments:   Hospital Course by problem list: [name] is a 89 y.o. male with pertinent PMH of ESRD on HD TTS, CAD, HFpEF, HTN, and T2DM who presented with right upper extremity AV fistula thrombus and is admitted for placement of tunneled dialysis catheter and hemodialysis.  Due to fistula thrombus was confirmed on ultrasound  Doppler here and vascular surgery did not recommend any immediate intervention.  IR was consulted for placement of a tunneled dialysis catheter which they placed on 12/09/2023 and he received dialysis early morning on 12/10/2023.  Hyperkalemia peaked at 6.3 without any EKG changes.  After dialysis it was within normal limits.  Prior to dialysis he received Lokelma  and calcium  gluconate.  The morning after dialysis he was feeling well and ready to go home.  Dialysis catheter appeared clean and worked well during dialysis.  He will resume his outpatient dialysis schedule and follow-up with vascular surgery outpatient.   Discharge Subjective: Patient is feeling well this morning without any new or worsening complaints.  There is no significant pain or return of dialysis catheter site and dialysis went well.  He is ready to go home.  Discharge Exam:   BP (!) 164/68 (BP Location: Left Arm)   Pulse 75   Temp 97.8 F (36.6 C) (Oral)   Resp 18   Ht 6' (1.829 m)   Wt 92.1 kg   SpO2 99%   BMI 27.53 kg/m  Constitutional: Well-appearing elderly male laying in bed. In no acute distress. Pulm: Normal work of breathing on room air. Skin:Warm and dry.,  Dialysis catheter and dressing over right chest with no signs of ongoing bleeding, drainage, or inflammation  Pertinent Labs, Studies, and Procedures:     Latest Ref Rng & Units 12/10/2023    4:34 AM 12/09/2023    9:14 AM 12/09/2023    8:40 AM  CBC  WBC 4.0 - 10.5 K/uL 9.6   7.2   Hemoglobin 13.0 - 17.0 g/dL 88.6  89.0  89.6   Hematocrit 39.0 - 52.0 % 35.0  32.0  33.2   Platelets 150 - 400 K/uL 219   234        Latest Ref Rng & Units 12/10/2023    4:34 AM 12/09/2023    5:50 PM 12/09/2023    9:14 AM  CMP  Glucose 70 - 99 mg/dL 80  894  89   BUN 8 - 23 mg/dL 30  65  56   Creatinine 0.61 - 1.24 mg/dL 3.52  88.47  87.19   Sodium 135 - 145 mmol/L 138  138  138   Potassium 3.5 - 5.1 mmol/L 4.4  6.3  5.9   Chloride 98 - 111 mmol/L 100  108  112   CO2 22 - 32  mmol/L 22  15    Calcium  8.9 - 10.3 mg/dL 8.7  8.9      IR TUNNELED CENTRAL VENOUS CATH PLC W IMG Result Date: 12/09/2023 INDICATION: 89 year old male with history of end-stage renal disease requiring replacement of tunneled hemodialysis catheter. EXAM: TUNNELED CENTRAL VENOUS HEMODIALYSIS CATHETER PLACEMENT WITH ULTRASOUND AND FLUOROSCOPIC GUIDANCE MEDICATIONS: Ancef  2 gm IV . The antibiotic was given in an appropriate time interval prior to skin puncture. ANESTHESIA/SEDATION:  Moderate (conscious) sedation was employed during this procedure. A total of Versed  2 mg and Fentanyl  100 mcg was administered intravenously. Moderate Sedation Time: 10 minutes. The patient's level of consciousness and vital signs were monitored continuously by radiology nursing throughout the procedure under my direct supervision. FLUOROSCOPY TIME:  Four mGy reference air kerma COMPLICATIONS: None immediate. PROCEDURE: Informed written consent was obtained from the patient after a discussion of the risks, benefits, and alternatives to treatment. Questions regarding the procedure were encouraged and answered. The right neck and chest were prepped with chlorhexidine  in a sterile fashion, and a sterile drape was applied covering the operative field. Maximum barrier sterile technique with sterile gowns and gloves were used for the procedure. A timeout was performed prior to the initiation of the procedure. After creating a small venotomy incision, a 21 gauge micropuncture kit was utilized to access the internal jugular vein. Real-time ultrasound guidance was utilized for vascular access including the acquisition of a permanent ultrasound image documenting patency of the accessed vessel. A Rosen wire was advanced to the level of the IVC and the micropuncture sheath was exchanged for an 8 Fr dilator. A 14.5 French tunneled hemodialysis catheter measuring 19 cm from tip to cuff was tunneled in a retrograde fashion from the anterior chest wall  to the venotomy incision. Serial dilation was then performed an a peel-away sheath was placed. The catheter was then placed through the peel-away sheath with the catheter tip ultimately positioned within the right atrium. Final catheter positioning was confirmed and documented with a spot radiographic image. The catheter aspirates and flushes normally. The catheter was flushed with appropriate volume heparin  dwells. The catheter exit site was secured with a 0-Silk retention suture. The venotomy incision was closed with Dermabond. Sterile dressings were applied. The patient tolerated the procedure well without immediate post procedural complication. IMPRESSION: Successful placement of 19 cm tip to cuff tunneled hemodialysis catheter via the right internal jugular vein with catheter tip terminating within the right atrium. The catheter is ready for immediate use. Ester Sides, MD [facility] Radiology Electronically Signed   By: Ester Sides M.D.   On: 12/09/2023 22:18   VAS US  DUPLEX DIALYSIS ACCESS (AVF, AVG) Result Date: 12/09/2023  UPPER EXTREMITY DUPLEX STUDY Patient Name:  CHIMAOBI CASEBOLT  Date of Exam:   12/09/2023 Medical Rec #: [id number]        Accession #:    [id number] Date of Birth: Jun 29, 1933       Patient Gender: M Patient Age:   77 years Exam Location:  [facility] Procedure:      VAS US  DUPLEX DIALYSIS ACCESS (AVF, AVG) Referring Phys: FAIRY STEVENS --------------------------------------------------------------------------------  Indications: Occluded AVG right upper arm.  Risk Factors:  Hypertension, hyperlipidemia, Diabetes, coronary artery disease. Other Factors: Two failed left arm AVFs - Brachiocephalic and Brachialbasilic                2024. Performing Technologist: Ricka Sturdivant-Jones RDMS, RVT  Examination Guidelines: A complete evaluation includes B-mode imaging, spectral Doppler, color Doppler, and power Doppler as needed of all  accessible portions of each vessel. Bilateral testing is considered an integral part of a complete examination. Limited examinations for reoccurring indications may be performed as noted.  Right Graft #1: +------------------+--------+--------+--------+--------+                   PSV cm/sStenosisWaveformComments +------------------+--------+--------+--------+--------+ Inflow                                             +------------------+--------+--------+--------+--------+  Prox Anastomosis          occluded                 +------------------+--------+--------+--------+--------+ Proximal Graft            occluded                 +------------------+--------+--------+--------+--------+ Mid Graft                 occluded                 +------------------+--------+--------+--------+--------+ Distal Graft              occluded                 +------------------+--------+--------+--------+--------+ Distal Anastomosis        occluded                 +------------------+--------+--------+--------+--------+ Outflow                                            +------------------+--------+--------+--------+--------+   Summary:  Right: Occluded right arteriovenous graft upper arm. Electronically signed by Debby Robertson on 12/09/2023 at 4:46:33 PM.    Final    DG Chest 2 View Result Date: 12/09/2023 CLINICAL DATA:  Delayed dialysis. EXAM: CHEST - 2 VIEW COMPARISON:  Chest radiograph 02/08/2023 FINDINGS: Normal cardiac silhouette. Central venous congestion. No overt pulmonary edema. No pleural fluid. Pneumothorax or pneumonia IMPRESSION: Central venous congestion without overt pulmonary edema. Electronically Signed   By: Jackquline Boxer M.D.   On: 12/09/2023 09:47     Discharge Instructions:   Discharge Instructions      Joaopedro, Eschbach were recently admitted to [facility] for placement of a tunneled dialysis catheter. Please continue to follow with  your nephrologists and PCP.   You should seek further medical care if you have any bleeding, pain, or drainage from your catheter.  We recommend that you see your primary care doctor in about a week to make sure that you continue to improve. We are so glad that you are feeling better.  Sincerely, Fairy Pool, DO    Signed: Fairy Pool, DO Internal Medicine Resident, PGY-3 Please contact the on call pager at [phone] for any urgent or emergent needs. 4:32 PM 12/10/2023

## 2023-12-11 LAB — HEPATITIS B SURFACE ANTIBODY, QUANTITATIVE: Hep B S AB Quant (Post): <3.5 m[IU]/mL — ABNORMAL LOW

## 2023-12-24 ENCOUNTER — Ambulatory Visit: Attending: Cardiology | Admitting: Cardiovascular Disease

## 2023-12-31 ENCOUNTER — Encounter: Payer: Self-pay | Admitting: Cardiovascular Disease

## 2023-12-31 ENCOUNTER — Ambulatory Visit: Attending: Cardiology | Admitting: Cardiovascular Disease

## 2023-12-31 VITALS — BP 128/76 | HR 75 | Ht 72.0 in | Wt 204.6 lb

## 2023-12-31 DIAGNOSIS — E785 Hyperlipidemia, unspecified: Secondary | ICD-10-CM

## 2023-12-31 DIAGNOSIS — I10 Essential (primary) hypertension: Secondary | ICD-10-CM | POA: Diagnosis not present

## 2023-12-31 DIAGNOSIS — I2510 Atherosclerotic heart disease of native coronary artery without angina pectoris: Secondary | ICD-10-CM | POA: Diagnosis not present

## 2023-12-31 DIAGNOSIS — Z9861 Coronary angioplasty status: Secondary | ICD-10-CM

## 2023-12-31 DIAGNOSIS — I359 Nonrheumatic aortic valve disorder, unspecified: Secondary | ICD-10-CM | POA: Diagnosis not present

## 2023-12-31 NOTE — Assessment & Plan Note (Signed)
 History of essential hypertension her blood pressure measured today at 128/76.  He is on carvedilol  and amlodipine  as well as isosorbide .

## 2023-12-31 NOTE — Assessment & Plan Note (Signed)
 History of moderate aortic stenosis with echo performed 02/09/2023 revealing normal LV systolic function, grade 2 diastolic dysfunction and moderate aortic stenosis with a valve area 1.09 cm and a peak gradient of 43 mmHg.  He is active and asymptomatic.  Will continue to follow us  on an annual basis.

## 2023-12-31 NOTE — Assessment & Plan Note (Signed)
 History of dyslipidemia on atorvastatin  lipid profile performed 02/07/2021 revealing total cholesterol 125, LDL 67 and HDL 38.  This is followed at the dialysis center.

## 2023-12-31 NOTE — Assessment & Plan Note (Signed)
 History of CAD status post LAD stenting by myself with a Promus drug-eluting stent October 2009.  His last Myoview performed 04/28/2011 was complete normal.  He is completely asymptomatic.

## 2023-12-31 NOTE — Addendum Note (Signed)
 Addended by: Yudit Modesitt W on: 12/31/2023 10:46 AM   Modules accepted: Orders

## 2023-12-31 NOTE — Patient Instructions (Signed)
   Testing/Procedures:  Your physician has requested that you have an echocardiogram. Echocardiography is a painless test that uses sound waves to create images of your heart. It provides your doctor with information about the size and shape of your heart and how well your heart's chambers and valves are working. This procedure takes approximately one hour. There are no restrictions for this procedure. Please do NOT wear cologne, perfume, aftershave, or lotions (deodorant is allowed). Please arrive 15 minutes prior to your appointment time.  Please note: We ask at that you not bring children with you during ultrasound (echo/ vascular) testing. Due to room size and safety concerns, children are not allowed in the ultrasound rooms during exams. Our front office staff cannot provide observation of children in our lobby area while testing is being conducted. An adult accompanying a patient to their appointment will only be allowed in the ultrasound room at the discretion of the ultrasound technician under special circumstances. We apologize for any inconvenience. [address]-SCHEDULE IN NOVEMBER  Follow-Up: At [facility], you and your health needs are our priority.  As part of our continuing mission to provide you with exceptional heart care, our providers are all part of one team.  This team includes your primary Cardiologist (physician) and Advanced Practice Providers or APPs (Physician Assistants and Nurse Practitioners) who all work together to provide you with the care you need, when you need it.  Your next appointment:   12 month(s)  Provider:   Dorn Lesches, MD

## 2023-12-31 NOTE — Progress Notes (Signed)
 12/31/2023 [name]   Jan 31, 1934  [id number]  Primary Physician Robert Thersia Jansky, MD Primary Cardiologist: Robert JINNY Lesches MD FACP, [place], [name], FSCAI  HPI:  [name] is a 89 y.o.  moderately overweight, recently remarried for the third time (09/30/14) [place] American male, father of 3 children and 4 stepchildren, grandfather to 20 grandchildren, who is formerly a patient of Dr. Donnia.  I last saw him in the office 11/06/2022.  He is accompanied by his wife [name] today.  He has a history of CAD status post LAD stenting with a Promus drug-eluting stent, October of 2009. He had attempt at VT ablation by Dr. Lynwood Rakers, February 2010; however, this was aborted because of inappropriate substrate. His other problems include hypertension, hyperlipidemia, diabetes, as well as history of prostate cancer. Since I saw him, he has been asymptomatic. His last Myoview performed 04/11/11 was completely normal.    Since I saw him in the office a year ago he continues to do well.  He began dialysis in January of this year (Tuesday/Thursday/Saturday).  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  He denies chest pain or shortness of breath.  He does have moderate aortic stenosis with a valve area 1.09 cm with a peak gradient of 43 mmHg but is completely asymptomatic.   Current Meds  Medication Sig   acetaminophen  (TYLENOL ) 500 MG tablet Take 1,000 mg by mouth in the morning.   amLODipine  (NORVASC ) 10 MG tablet TAKE 1 TABLET EVERY DAY (APPOINTMENT IS NEEDED FOR REFILLS)   atorvastatin  (LIPITOR) 10 MG tablet Take 1 tablet (10 mg total) by mouth daily.   B Complex-C-Folic Acid (DIALYVITE 800) 0.8 MG TABS Take 1 tablet by mouth daily.   Blood Glucose Monitoring Suppl (ACCU-CHEK AVIVA PLUS) w/Device KIT Test blood sugar 3 times daily. Dx code: E11.22   Blood Pressure Monitoring (BLOOD PRESS MONITOR/M-L CUFF) MISC 1 application by Does not apply route  daily.   calcitRIOL  (ROCALTROL ) 0.25 MCG capsule Take 0.25 mcg by mouth Every Tuesday,Thursday,and Saturday with dialysis.   carvedilol  (COREG ) 25 MG tablet TAKE 1 TABLET TWICE DAILY. KEEP OFFICE VISIT   Cholecalciferol  (VITAMIN D -3 PO) Take 1 tablet by mouth daily.   Continuous Blood Gluc Sensor (FREESTYLE LIBRE SENSOR SYSTEM) MISC 1 application by Does not apply route daily.   Continuous Glucose Monitor Sup KIT 1 kit by Does not apply route as directed.   glucose blood (ACCU-CHEK AVIVA PLUS) test strip Test blood sugar 3 times daily. Dx code: E11.22   insulin  NPH-regular Human (NOVOLIN  70/30) (70-30) 100 UNIT/ML injection Inject 8 Units into the skin 2 (two) times daily with a meal.   Insulin  Syringe-Needle U-100 (B-D INS SYR HALF-UNIT .3CC/31G) 31G X 5/16 0.3 ML MISC Use daily at bedtime to inject insulin . Dx code: 250.00   isosorbide  mononitrate (IMDUR ) 60 MG 24 hr tablet TAKE 2 TABLETS EVERY DAY   Lancets (ACCU-CHEK MULTICLIX) lancets Test blood sugar 3 times daily. Dx code: E11.22   lidocaine  (LIDODERM ) 5 % Place 1 patch onto the skin daily. Remove & Discard patch within 12 hours or as directed by MD (Patient taking differently: Place 1 patch onto the skin daily as needed (Pain). Remove & Discard patch within 12 hours or as directed by MD)   melatonin 3 MG TABS tablet Take 1 tablet (3 mg total) by mouth at bedtime.   Polyethyl Glycol-Propyl Glycol (SYSTANE) 0.4-0.3 % SOLN Place 1 drop  into both eyes as needed (dry eyes).   tamsulosin  (FLOMAX ) 0.4 MG CAPS capsule Take 0.4 mg by mouth daily.   torsemide  (DEMADEX ) 20 MG tablet Take 2 tablets (40 mg total) by mouth 2 (two) times daily. In case of weight gain 2 to 3 lbs in 24 hrs or 5 lbs in 7 days, take one extra tablet a day in the morning, until weight back to baseline. (Patient taking differently: Take 20 mg by mouth 2 (two) times daily. In case of weight gain 2 to 3 lbs in 24 hrs or 5 lbs in 7 days, take one extra tablet a day in the morning,  until weight back to baseline.)     No Known Allergies  Social History   Socioeconomic History   Marital status: Married    Spouse name: Not on file   Number of children: 7   Years of education: Not on file   Highest education level: Not on file  Occupational History   Occupation: RETIRED    Employer: RETIRED  Tobacco Use   Smoking status: Former    Current packs/day: 0.00    Types: Cigarettes    Quit date: 05/10/2002    Years since quitting: 21.6   Smokeless tobacco: Never  Vaping Use   Vaping status: Never Used  Substance and Sexual Activity   Alcohol  use: No   Drug use: Not Currently    Types: Marijuana    Comment: Last use marijuana was on 01/20/23.  Informed to withhold 1-2 days prior to procedure.   Sexual activity: Not Currently  Other Topics Concern   Not on file  Social History Narrative   Not on file   Social Drivers of Health   Financial Resource Strain: Not on file  Food Insecurity: No Food Insecurity (12/09/2023)   Hunger Vital Sign    Worried About Running Out of Food in the Last Year: Never true    Ran Out of Food in the Last Year: Never true  Transportation Needs: No Transportation Needs (12/09/2023)   PRAPARE - Administrator, Civil Service (Medical): No    Lack of Transportation (Non-Medical): No  Physical Activity: Not on file  Stress: Not on file  Social Connections: Patient Declined (12/09/2023)   Social Connection and Isolation Panel    Frequency of Communication with Friends and Family: Patient declined    Frequency of Social Gatherings with Friends and Family: Patient declined    Attends Religious Services: Patient declined    Database administrator or Organizations: Patient declined    Attends Banker Meetings: Patient declined    Marital Status: Patient declined  Intimate Partner Violence: Not At Risk (12/09/2023)   Humiliation, Afraid, Rape, and Kick questionnaire    Fear of Current or Ex-Partner: No    Emotionally  Abused: No    Physically Abused: No    Sexually Abused: No     Review of Systems: General: negative for chills, fever, night sweats or weight changes.  Cardiovascular: negative for chest pain, dyspnea on exertion, edema, orthopnea, palpitations, paroxysmal nocturnal dyspnea or shortness of breath Dermatological: negative for rash Respiratory: negative for cough or wheezing Urologic: negative for hematuria Abdominal: negative for nausea, vomiting, diarrhea, bright red blood per rectum, melena, or hematemesis Neurologic: negative for visual changes, syncope, or dizziness All other systems reviewed and are otherwise negative except as noted above.    Blood pressure 128/76, pulse 75, height 6' (1.829 m), weight 204 lb 9.6 oz (  92.8 kg), SpO2 96%.  General appearance: alert and no distress Neck: no adenopathy, no carotid bruit, no JVD, supple, symmetrical, trachea midline, and thyroid not enlarged, symmetric, no tenderness/mass/nodules Lungs: clear to auscultation bilaterally Heart: Soft outflow tract murmur Extremities: extremities normal, atraumatic, no cyanosis or edema Pulses: 2+ and symmetric Skin: Skin color, texture, turgor normal. No rashes or lesions Neurologic: Grossly normal  EKG not performed today      ASSESSMENT AND PLAN:   CAD S/P percutaneous coronary angioplasty History of CAD status post LAD stenting by myself with a Promus drug-eluting stent October 2009.  His last Myoview performed 04/28/2011 was complete normal.  He is completely asymptomatic.  Essential hypertension History of essential hypertension her blood pressure measured today at 128/76.  He is on carvedilol  and amlodipine  as well as isosorbide .  Dyslipidemia, goal LDL below 70 History of dyslipidemia on atorvastatin  lipid profile performed 02/07/2021 revealing total cholesterol 125, LDL 67 and HDL 38.  This is followed at the dialysis center.  Aortic valve disease History of moderate aortic stenosis with  echo performed 02/09/2023 revealing normal LV systolic function, grade 2 diastolic dysfunction and moderate aortic stenosis with a valve area 1.09 cm and a peak gradient of 43 mmHg.  He is active and asymptomatic.  Will continue to follow us  on an annual basis.     Robert DOROTHA Lesches MD FACP,FACC,FAHA, [facility] 12/31/2023 10:40 AM

## 2024-01-20 NOTE — Progress Notes (Unsigned)
 Patient name: [name] MRN: [id number] DOB: Jul 03, 1933 Sex: male  REASON FOR VISIT: Thrombosed access, new access needed  HPI: [name] is a 89 y.o. male with history of CAD, CHF, hyperlipidemia, diabetes, ESRD that presents for evaluation of thrombosed access with the need for new permanent dialysis access.  Patient has had multiple prior left arm access procedures including a left brachiocephalic fistula, left basilic fistula and left upper arm AV graft.  Most recently had a right upper arm AV graft on 09/24/2023 by Dr. Magda.  Past Medical History:  Diagnosis Date   Allergy     Anemia    CAD (coronary artery disease)    pci to LAD 10/09; stable CAD by cath 05/31/08; myoview 04/11/11- no ishcemia, EF 67%; echo 05/15/09- EF>55%, mod calcification of the aortic valve leaflets   CAD S/P percutaneous coronary angioplasty 05/08/2008   LAD PCI with DES 2009- Myoview low risk 2013   CHF (congestive heart failure) (HCC) 09/07/2019   Chronic back pain    Chronic kidney disease    Cystitis, radiation 12/01/2013   Dyspnea    ED (erectile dysfunction)    Elevated serum creatinine 12/14/2015   Essential hypertension, benign    Hematuria 12/01/2013   History of blood transfusion    Hyperlipidemia    Ileus (HCC) 12/14/2015   Insulin  dependent type 2 diabetes mellitus, controlled (HCC)    Malignant neoplasm of prostate (HCC) 11/26/2013   Multiple rib fractures    left 9th, 10th, and 11th posterior rib fractures S/P fall 12/09/2015/notes 12/12/2015   Osteoarthritis of right knee 03/04/2018   Personal history of COVID-19 09/05/2021   PREMATURE VENTRICULAR CONTRACTIONS 05/08/2008   Qualifier: Diagnosis of  By: Kelsie, MD, James     Prostate cancer [facility])    s/p   Rectal bleeding 12/23/2012   Evaluated by Dr Rollin GI patient with history of radiation proctitis. Patient due for colonoscopy on 04/2014    Type II or unspecified type diabetes mellitus without mention of complication, not stated  as uncontrolled    Ventricular tachycardia (HCC)    resuscitated; monitor 05/2008; attempted T ablation 2/10- aborted due to inappropriate substrate    Past Surgical History:  Procedure Laterality Date   A/V SHUNT INTERVENTION [name] 08/08/2023   Procedure: A/V SHUNT INTERVENTION;  Surgeon: Tobie Gordy POUR, MD;  Location[facility] INVASIVE CV LAB;  Service: Cardiovascular;  Laterality: [name];   A/V SHUNT INTERVENTION [name] 09/03/2023   Procedure: A/V SHUNT INTERVENTION;  Surgeon: Lanis Fonda BRAVO, MD;  Location: [facility] PV LAB;  Service: Cardiovascular;  Laterality: [name];   AV FISTULA PLACEMENT Left 12/02/2022   Procedure: LEFT ARM BRACHIOCEPHALIC ARTERIOVENOUS (AV) FISTULA CREATION;  Surgeon: Gretta Lonni PARAS, MD;  Location: [facility] OR;  Service: Vascular;  Laterality: Left;   AV FISTULA PLACEMENT Left 01/22/2023   Procedure: LEFT ARM BRACHIOBASILISC ARTERIOVENOUS (AV) FISTULA CREATION;  Surgeon: Gretta Lonni PARAS, MD;  Location: [facility] OR;  Service: Vascular;  Laterality: Left;   BASCILIC VEIN TRANSPOSITION Left 05/02/2023   Procedure: Insertion of LEFT Arm Arterio-venous Gortex Graft;  Surgeon: Gretta Lonni PARAS, MD;  Location: [facility] OR;  Service: Vascular;  Laterality: Left;   CARDIAC CATHETERIZATION  05/31/08   EF 55-60%, stable lesions, medical therapy   COLECTOMY  '80's   polyps   CORONARY ANGIOPLASTY WITH STENT PLACEMENT  01/19/08   PCI stent to mid LAD with Promus DES 27.5x28   CYSTOSCOPY WITH DIRECT VISION INTERNAL URETHROTOMY [name] 03/12/2021   Procedure: cystoscoopy withdirect vision  internal urethrotomy optilum urethral dilation     ;  Surgeon: Carolee Sherwood JONETTA DOUGLAS, MD;  Location: [facility] ORS;  Service: Urology;  Laterality: [name];  RERQUESTING 45 MINS   DIALYSIS/PERMA CATHETER INSERTION [name] 04/10/2023   Procedure: DIALYSIS/PERMA CATHETER INSERTION;  Surgeon: Tobie Gordy POUR, MD;  Location: [facility] INVASIVE CV LAB;  Service: Cardiovascular;  Laterality: [name];   DIALYSIS/PERMA CATHETER INSERTION [name] 09/05/2023   Procedure:  DIALYSIS/PERMA CATHETER INSERTION;  Surgeon: Serene Gaile ORN, MD;  Location: [facility] PV LAB;  Service: Cardiovascular;  Laterality: [name];   INSERTION OF ARTERIOVENOUS (AV) ARTEGRAFT ARM Right 09/24/2023   Procedure: INSERTION, GRAFT, ARTERIOVENOUS, UPPER EXTREMITY;  Surgeon: Magda Debby SAILOR, MD;  Location: [facility] OR;  Service: Vascular;  Laterality: Right;   IR GENERIC HISTORICAL  01/03/2016   IR THORACENTESIS ASP PLEURAL SPACE W/IMG GUIDE 01/03/2016 [facility]-INTERV RAD   IR REMOVAL TUN CV CATH W/O FL  11/07/2023   IR TUNNELED CENTRAL VENOUS CATH PLC W IMG  12/09/2023   KNEE ARTHROPLASTY Right 03/04/2018   Procedure: COMPUTER ASSISTED TOTAL KNEE ARTHROPLASTY;  Surgeon: Fidel Rogue, MD;  Location: [facility] ORS;  Service: Orthopedics;  Laterality: Right;   PERIPHERAL VASCULAR THROMBECTOMY  09/03/2023   Procedure: PERIPHERAL VASCULAR THROMBECTOMY;  Surgeon: Lanis Fonda BRAVO, MD;  Location: [facility] LAB;  Service: Cardiovascular;;  avf   PTCA  01/2008   TRANSURETHRAL RESECTION OF BLADDER TUMOR [name] 11/10/2020   Procedure: PHYLLIS BOYCE CHARLESETTA GEROLD;  Surgeon: Sherrilee Belvie CROME, MD;  Location: [facility] ORS;  Service: Urology;  Laterality: [name];   TRANSURETHRAL RESECTION OF BLADDER TUMOR [name] 08/12/2022   Procedure: TRANSURETHRAL RESECTION OF BLADDER TUMOR (TURBT);  Surgeon: Carolee Sherwood JONETTA DOUGLAS, MD;  Location: [facility] ORS;  Service: Urology;  Laterality: [name];   TRANSURETHRAL RESECTION OF BLADDER TUMOR WITH MITOMYCIN -C [name] 09/17/2021   Procedure: TRANSURETHRAL RESECTION OF BLADDER TUMOR WITH GEMCITABINE ;  Surgeon: Carolee Sherwood JONETTA DOUGLAS, MD;  Location: [facility] ORS;  Service: Urology;  Laterality: [name];   VENOUS STENT  09/03/2023   Procedure: VENOUS STENT;  Surgeon: Lanis Fonda BRAVO, MD;  Location: [facility] LAB;  Service: Cardiovascular;;  avf    Family History  Problem Relation Age of Onset   Diabetes Mother    Heart attack Mother    Leukemia Father    Heart disease Sister    Heart disease Brother    Diabetes Brother        pancreatic  cancer    SOCIAL HISTORY: Social History   Tobacco Use   Smoking status: Former    Current packs/day: 0.00    Types: Cigarettes    Quit date: 05/10/2002    Years since quitting: 21.7   Smokeless tobacco: Never  Substance Use Topics   Alcohol  use: No    No Known Allergies  Current Outpatient Medications  Medication Sig Dispense Refill   acetaminophen  (TYLENOL ) 500 MG tablet Take 1,000 mg by mouth in the morning.     amLODipine  (NORVASC ) 10 MG tablet TAKE 1 TABLET EVERY DAY (APPOINTMENT IS NEEDED FOR REFILLS) 90 tablet 3   atorvastatin  (LIPITOR) 10 MG tablet Take 1 tablet (10 mg total) by mouth daily. 90 tablet 0   B Complex-C-Folic Acid (DIALYVITE 800) 0.8 MG TABS Take 1 tablet by mouth daily.     Blood Glucose Monitoring Suppl (ACCU-CHEK AVIVA PLUS) w/Device KIT Test blood sugar 3 times daily. Dx code: E11.22 1 kit 0   Blood Pressure Monitoring (BLOOD PRESS MONITOR/M-L CUFF) MISC 1 application by Does not apply route daily.  1 each 0   calcitRIOL  (ROCALTROL ) 0.25 MCG capsule Take 0.25 mcg by mouth Every Tuesday,Thursday,and Saturday with dialysis.     carvedilol  (COREG ) 25 MG tablet TAKE 1 TABLET TWICE DAILY. KEEP OFFICE VISIT 180 tablet 1   Cholecalciferol  (VITAMIN D -3 PO) Take 1 tablet by mouth daily.     Continuous Blood Gluc Sensor (FREESTYLE LIBRE SENSOR SYSTEM) MISC 1 application by Does not apply route daily. 1 each 2   Continuous Glucose Monitor Sup KIT 1 kit by Does not apply route as directed. 1 kit 0   glucose blood (ACCU-CHEK AVIVA PLUS) test strip Test blood sugar 3 times daily. Dx code: E11.22 300 each 3   insulin  NPH-regular Human (NOVOLIN  70/30) (70-30) 100 UNIT/ML injection Inject 8 Units into the skin 2 (two) times daily with a meal.     Insulin  Syringe-Needle U-100 (B-D INS SYR HALF-UNIT .3CC/31G) 31G X 5/16 0.3 ML MISC Use daily at bedtime to inject insulin . Dx code: 250.00 100 each 3   isosorbide  mononitrate (IMDUR ) 60 MG 24 hr tablet TAKE 2 TABLETS EVERY DAY 180  tablet 3   Lancets (ACCU-CHEK MULTICLIX) lancets Test blood sugar 3 times daily. Dx code: E11.22 300 each 3   lidocaine  (LIDODERM ) 5 % Place 1 patch onto the skin daily. Remove & Discard patch within 12 hours or as directed by MD (Patient taking differently: Place 1 patch onto the skin daily as needed (Pain). Remove & Discard patch within 12 hours or as directed by MD) 5 patch 0   melatonin 3 MG TABS tablet Take 1 tablet (3 mg total) by mouth at bedtime. 30 tablet 0   Polyethyl Glycol-Propyl Glycol (SYSTANE) 0.4-0.3 % SOLN Place 1 drop into both eyes as needed (dry eyes).     tamsulosin  (FLOMAX ) 0.4 MG CAPS capsule Take 0.4 mg by mouth daily.     torsemide  (DEMADEX ) 20 MG tablet Take 2 tablets (40 mg total) by mouth 2 (two) times daily. In case of weight gain 2 to 3 lbs in 24 hrs or 5 lbs in 7 days, take one extra tablet a day in the morning, until weight back to baseline. (Patient taking differently: Take 20 mg by mouth 2 (two) times daily. In case of weight gain 2 to 3 lbs in 24 hrs or 5 lbs in 7 days, take one extra tablet a day in the morning, until weight back to baseline.) 80 tablet 0   No current facility-administered medications for this visit.    REVIEW OF SYSTEMS:  [X]  denotes positive finding, [ ]  denotes negative finding Cardiac  Comments:  Chest pain or chest pressure: ***   Shortness of breath upon exertion:    Short of breath when lying flat:    Irregular heart rhythm:        Vascular    Pain in calf, thigh, or hip brought on by ambulation:    Pain in feet at night that wakes you up from your sleep:     Blood clot in your veins:    Leg swelling:         Pulmonary    Oxygen at home:    Productive cough:     Wheezing:         Neurologic    Sudden weakness in arms or legs:     Sudden numbness in arms or legs:     Sudden onset of difficulty speaking or slurred speech:    Temporary loss of vision in one eye:  Problems with dizziness:         Gastrointestinal     Blood in stool:     Vomited blood:         Genitourinary    Burning when urinating:     Blood in urine:        Psychiatric    Major depression:         Hematologic    Bleeding problems:    Problems with blood clotting too easily:        Skin    Rashes or ulcers:        Constitutional    Fever or chills:      PHYSICAL EXAM: There were no vitals filed for this visit.  GENERAL: The patient is a well-nourished male, in no acute distress. The vital signs are documented above. CARDIAC: There is a regular rate and rhythm.  VASCULAR: *** PULMONARY: There is good air exchange bilaterally without wheezing or rales. ABDOMEN: Soft and non-tender with normal pitched bowel sounds.  MUSCULOSKELETAL: There are no major deformities or cyanosis. NEUROLOGIC: No focal weakness or paresthesias are detected. SKIN: There are no ulcers or rashes noted. PSYCHIATRIC: The patient has a normal affect.  DATA:   ***  Assessment/Plan:  89 y.o. male with history of CAD, CHF, hyperlipidemia, diabetes, ESRD that presents for evaluation of thrombosed access with the need for new permanent dialysis access.  Patient has had multiple prior left arm access procedures including a left brachiocephalic fistula, left basilic fistula and left upper arm AV graft.  Most recently had a right upper arm AV graft on 09/24/2023 by Dr. Magda.  Given failed bilateral upper extremity access including AV grafts in both arms I have recommended upper extremity venogram to evaluate next best option.   [name], MD [facility] Office: [phone]

## 2024-01-21 ENCOUNTER — Other Ambulatory Visit: Payer: Self-pay

## 2024-01-21 ENCOUNTER — Encounter: Payer: Self-pay | Admitting: Vascular Surgery

## 2024-01-21 ENCOUNTER — Ambulatory Visit: Attending: Vascular Surgery | Admitting: Vascular Surgery

## 2024-01-21 VITALS — BP 127/70 | HR 62 | Temp 98.3°F | Resp 18 | Ht 72.0 in | Wt 203.0 lb

## 2024-01-21 DIAGNOSIS — N186 End stage renal disease: Secondary | ICD-10-CM

## 2024-01-26 ENCOUNTER — Encounter (HOSPITAL_COMMUNITY): Admission: RE | Payer: Self-pay

## 2024-01-26 ENCOUNTER — Ambulatory Visit (HOSPITAL_COMMUNITY): Admission: RE | Admit: 2024-01-26 | Source: Home / Self Care | Admitting: Vascular Surgery

## 2024-01-26 SURGERY — A/V FISTULAGRAM
Anesthesia: LOCAL | Site: Arm Upper | Laterality: Bilateral

## 2024-02-04 ENCOUNTER — Other Ambulatory Visit: Payer: Self-pay | Admitting: Cardiovascular Disease

## 2024-02-04 DIAGNOSIS — E785 Hyperlipidemia, unspecified: Secondary | ICD-10-CM

## 2024-02-06 ENCOUNTER — Encounter (HOSPITAL_COMMUNITY): Payer: Self-pay

## 2024-02-07 DIAGNOSIS — Z992 Dependence on renal dialysis: Secondary | ICD-10-CM | POA: Diagnosis not present

## 2024-02-07 DIAGNOSIS — N186 End stage renal disease: Secondary | ICD-10-CM | POA: Diagnosis not present

## 2024-02-07 DIAGNOSIS — N2581 Secondary hyperparathyroidism of renal origin: Secondary | ICD-10-CM | POA: Diagnosis not present

## 2024-02-10 DIAGNOSIS — N186 End stage renal disease: Secondary | ICD-10-CM | POA: Diagnosis not present

## 2024-02-10 DIAGNOSIS — N2581 Secondary hyperparathyroidism of renal origin: Secondary | ICD-10-CM | POA: Diagnosis not present

## 2024-02-10 DIAGNOSIS — Z992 Dependence on renal dialysis: Secondary | ICD-10-CM | POA: Diagnosis not present

## 2024-02-11 ENCOUNTER — Telehealth: Payer: Self-pay

## 2024-02-11 NOTE — Telephone Encounter (Signed)
 Spoke to daughter re: time change of surgery on 11/6 with Dr. Gretta.Patient daughter acknowledge time change. Message also left on patient's phone.

## 2024-02-12 ENCOUNTER — Encounter (HOSPITAL_COMMUNITY): Admission: RE | Disposition: A | Payer: Self-pay | Source: Home / Self Care | Attending: Vascular Surgery

## 2024-02-12 ENCOUNTER — Ambulatory Visit (HOSPITAL_COMMUNITY)
Admission: RE | Admit: 2024-02-12 | Discharge: 2024-02-12 | Disposition: A | Discharge status: Home / Self Care | Attending: Vascular Surgery | Admitting: Vascular Surgery

## 2024-02-12 ENCOUNTER — Other Ambulatory Visit: Payer: Self-pay

## 2024-02-12 DIAGNOSIS — T82868A Thrombosis of vascular prosthetic devices, implants and grafts, initial encounter: Secondary | ICD-10-CM | POA: Diagnosis not present

## 2024-02-12 DIAGNOSIS — Y832 Surgical operation with anastomosis, bypass or graft as the cause of abnormal reaction of the patient, or of later complication, without mention of misadventure at the time of the procedure: Secondary | ICD-10-CM | POA: Diagnosis not present

## 2024-02-12 DIAGNOSIS — Z79899 Other long term (current) drug therapy: Secondary | ICD-10-CM | POA: Diagnosis not present

## 2024-02-12 DIAGNOSIS — N186 End stage renal disease: Secondary | ICD-10-CM

## 2024-02-12 DIAGNOSIS — Z794 Long term (current) use of insulin: Secondary | ICD-10-CM | POA: Insufficient documentation

## 2024-02-12 DIAGNOSIS — I2510 Atherosclerotic heart disease of native coronary artery without angina pectoris: Secondary | ICD-10-CM | POA: Insufficient documentation

## 2024-02-12 DIAGNOSIS — I132 Hypertensive heart and chronic kidney disease with heart failure and with stage 5 chronic kidney disease, or end stage renal disease: Secondary | ICD-10-CM | POA: Diagnosis present

## 2024-02-12 DIAGNOSIS — E785 Hyperlipidemia, unspecified: Secondary | ICD-10-CM | POA: Insufficient documentation

## 2024-02-12 DIAGNOSIS — Z87891 Personal history of nicotine dependence: Secondary | ICD-10-CM | POA: Diagnosis not present

## 2024-02-12 DIAGNOSIS — E1122 Type 2 diabetes mellitus with diabetic chronic kidney disease: Secondary | ICD-10-CM | POA: Insufficient documentation

## 2024-02-12 DIAGNOSIS — I509 Heart failure, unspecified: Secondary | ICD-10-CM | POA: Insufficient documentation

## 2024-02-12 DIAGNOSIS — Z992 Dependence on renal dialysis: Secondary | ICD-10-CM | POA: Diagnosis not present

## 2024-02-12 DIAGNOSIS — N2581 Secondary hyperparathyroidism of renal origin: Secondary | ICD-10-CM | POA: Diagnosis not present

## 2024-02-12 HISTORY — PX: UPPER EXTREMITY VENOGRAPHY: CATH118272

## 2024-02-12 LAB — POCT I-STAT, CHEM 8
BUN: 48 mg/dL — ABNORMAL HIGH (ref 8–23)
Calcium, Ion: 1.15 mmol/L (ref 1.15–1.40)
Chloride: 103 mmol/L (ref 98–111)
Creatinine, Ser: 10.2 mg/dL — ABNORMAL HIGH (ref 0.61–1.24)
Glucose, Bld: 96 mg/dL (ref 70–99)
HCT: 34 % — ABNORMAL LOW (ref 39.0–52.0)
Hemoglobin: 11.6 g/dL — ABNORMAL LOW (ref 13.0–17.0)
Potassium: 5.1 mmol/L (ref 3.5–5.1)
Sodium: 137 mmol/L (ref 135–145)
TCO2: 26 mmol/L (ref 22–32)

## 2024-02-12 SURGERY — UPPER EXTREMITY VENOGRAPHY
Anesthesia: LOCAL | Laterality: Bilateral

## 2024-02-12 MED ORDER — SODIUM CHLORIDE 0.9% FLUSH: 3.0000 mL | INTRAVENOUS | Status: DC | PRN

## 2024-02-12 MED ORDER — IODIXANOL 320 MG/ML IV SOLN
INTRAVENOUS | Status: DC | PRN
Start: 2024-02-12 — End: 2024-02-12
  Administered 2024-02-12: 25 mL

## 2024-02-12 NOTE — H&P (Signed)
 History and Physical Interval Note:  02/12/2024 8:39 AM  [name]  has presented today for surgery, with the diagnosis of esrd.  The various methods of treatment have been discussed with the patient and family. After consideration of risks, benefits and other options for treatment, the patient has consented to  Procedure(s): UPPER EXTREMITY VENOGRAPHY (Bilateral) as a surgical intervention.  The patient's history has been reviewed, patient examined, no change in status, stable for surgery.  I have reviewed the patient's chart and labs.  Questions were answered to the patient's satisfaction.     [name]     Patient name: [name]        MRN: [id number]        DOB: 12-22-1933        Sex: male   REASON FOR VISIT: Thrombosed access, new access needed   HPI: [name] is a 89 y.o. male with history of CAD, CHF, hyperlipidemia, diabetes, ESRD that presents for evaluation of thrombosed access with the need for new permanent dialysis access.  Patient has had multiple prior left arm access procedures including a left brachiocephalic fistula, left basilic fistula and left upper arm AV graft.  Most recently had a right upper arm AV graft on 09/24/2023 by Dr. Magda.   He states his right arm graft thrombosed about 1 month ago after 3 sessions.  He is right-handed.  Using a right IJ TDC.       Past Medical History:  Diagnosis Date   Allergy      Anemia     CAD (coronary artery disease)      pci to LAD 10/09; stable CAD by cath 05/31/08; myoview 04/11/11- no ishcemia, EF 67%; echo 05/15/09- EF>55%, mod calcification of the aortic valve leaflets   CAD S/P percutaneous coronary angioplasty 05/08/2008    LAD PCI with DES 2009- Myoview low risk 2013   CHF (congestive heart failure) (HCC) 09/07/2019   Chronic back pain     Chronic kidney disease     Cystitis, radiation 12/01/2013   Dyspnea     ED (erectile dysfunction)     Elevated serum creatinine 12/14/2015   Essential  hypertension, benign     Hematuria 12/01/2013   History of blood transfusion     Hyperlipidemia     Ileus (HCC) 12/14/2015   Insulin  dependent type 2 diabetes mellitus, controlled (HCC)     Malignant neoplasm of prostate (HCC) 11/26/2013   Multiple rib fractures      left 9th, 10th, and 11th posterior rib fractures S/P fall 12/09/2015/notes 12/12/2015   Osteoarthritis of right knee 03/04/2018   Personal history of COVID-19 09/05/2021   PREMATURE VENTRICULAR CONTRACTIONS 05/08/2008    Qualifier: Diagnosis of  By: [name]     Prostate cancer [facility])      s/p   Rectal bleeding 12/23/2012    Evaluated by Dr Rollin GI patient with history of radiation proctitis. Patient due for colonoscopy on 04/2014    Type II or unspecified type diabetes mellitus without mention of complication, not stated as uncontrolled     Ventricular tachycardia (HCC)      resuscitated; monitor 05/2008; attempted T ablation 2/10- aborted due to inappropriate substrate               Past Surgical History:  Procedure Laterality Date   A/V SHUNT INTERVENTION [name] 08/08/2023    Procedure: A/V SHUNT INTERVENTION;  Surgeon: Robert Gordy POUR, MD;  Location: [facility] INVASIVE CV  LAB;  Service: Cardiovascular;  Laterality: [name];   A/V SHUNT INTERVENTION [name] 09/03/2023    Procedure: A/V SHUNT INTERVENTION;  Surgeon: Robert Fonda BRAVO, MD;  Location: [facility] PV LAB;  Service: Cardiovascular;  Laterality: [name];   AV FISTULA PLACEMENT Left 12/02/2022    Procedure: LEFT ARM BRACHIOCEPHALIC ARTERIOVENOUS (AV) FISTULA CREATION;  Surgeon: Robert Robert PARAS, MD;  Location: [facility] OR;  Service: Vascular;  Laterality: Left;   AV FISTULA PLACEMENT Left 01/22/2023    Procedure: LEFT ARM BRACHIOBASILISC ARTERIOVENOUS (AV) FISTULA CREATION;  Surgeon: Robert Robert PARAS, MD;  Location: [facility] OR;  Service: Vascular;  Laterality: Left;   BASCILIC VEIN TRANSPOSITION Left 05/02/2023    Procedure: Insertion of LEFT Arm Arterio-venous Gortex Graft;  Surgeon: Robert Robert PARAS, MD;  Location: [facility] OR;  Service: Vascular;  Laterality: Left;   CARDIAC CATHETERIZATION   05/31/08    EF 55-60%, stable lesions, medical therapy   COLECTOMY   '80's    polyps   CORONARY ANGIOPLASTY WITH STENT PLACEMENT   01/19/08    PCI stent to mid LAD with Promus DES 27.5x28   CYSTOSCOPY WITH DIRECT VISION INTERNAL URETHROTOMY [name] 03/12/2021    Procedure: cystoscoopy withdirect vision internal urethrotomy optilum urethral dilation     ;  Surgeon: Robert Sherwood JONETTA DOUGLAS, MD;  Location: [facility] ORS;  Service: Urology;  Laterality: [name];  RERQUESTING 45 MINS   DIALYSIS/PERMA CATHETER INSERTION [name] 04/10/2023    Procedure: DIALYSIS/PERMA CATHETER INSERTION;  Surgeon: Robert Gordy POUR, MD;  Location: [facility] INVASIVE CV LAB;  Service: Cardiovascular;  Laterality: [name];   DIALYSIS/PERMA CATHETER INSERTION [name] 09/05/2023    Procedure: DIALYSIS/PERMA CATHETER INSERTION;  Surgeon: Robert Gaile ORN, MD;  Location: [facility];  Service: Cardiovascular;  Laterality: [name];   INSERTION OF ARTERIOVENOUS (AV) ARTEGRAFT ARM Right 09/24/2023    Procedure: INSERTION, GRAFT, ARTERIOVENOUS, UPPER EXTREMITY;  Surgeon: Robert Debby SAILOR, MD;  Location: [facility] OR;  Service: Vascular;  Laterality: Right;   IR GENERIC HISTORICAL   01/03/2016    IR THORACENTESIS ASP PLEURAL SPACE W/IMG GUIDE 01/03/2016 [facility]-INTERV RAD   IR REMOVAL TUN CV CATH W/O FL   11/07/2023   IR TUNNELED CENTRAL VENOUS CATH PLC W IMG   12/09/2023   KNEE ARTHROPLASTY Right 03/04/2018    Procedure: COMPUTER ASSISTED TOTAL KNEE ARTHROPLASTY;  Surgeon: Robert Rogue, MD;  Location: [facility] ORS;  Service: Orthopedics;  Laterality: Right;   PERIPHERAL VASCULAR THROMBECTOMY   09/03/2023    Procedure: PERIPHERAL VASCULAR THROMBECTOMY;  Surgeon: Robert Fonda BRAVO, MD;  Location: [facility];  Service: Cardiovascular;;  avf   PTCA   01/2008   TRANSURETHRAL RESECTION OF BLADDER TUMOR [name] 11/10/2020    Procedure: [name];  Surgeon: Robert Belvie CROME, MD;   Location: [facility] ORS;  Service: Urology;  Laterality: [name];   TRANSURETHRAL RESECTION OF BLADDER TUMOR [name] 08/12/2022    Procedure: TRANSURETHRAL RESECTION OF BLADDER TUMOR (TURBT);  Surgeon: Robert Sherwood JONETTA DOUGLAS, MD;  Location: [facility] ORS;  Service: Urology;  Laterality: [name];   TRANSURETHRAL RESECTION OF BLADDER TUMOR WITH MITOMYCIN -C [name] 09/17/2021    Procedure: TRANSURETHRAL RESECTION OF BLADDER TUMOR WITH GEMCITABINE ;  Surgeon: Robert Sherwood JONETTA DOUGLAS, MD;  Location: [facility] ORS;  Service: Urology;  Laterality: [name];   VENOUS STENT   09/03/2023    Procedure: VENOUS STENT;  Surgeon: Robert Fonda BRAVO, MD;  Location: [facility];  Service: Cardiovascular;;  avf               Family  History  Problem Relation Age of Onset   Diabetes Mother     Heart attack Mother     Leukemia Father     Heart disease Sister     Heart disease Brother     Diabetes Brother          pancreatic cancer          SOCIAL HISTORY: Social History         Tobacco Use   Smoking status: Former      Current packs/day: 0.00      Types: Cigarettes      Quit date: 05/10/2002      Years since quitting: 21.7   Smokeless tobacco: Never  Substance Use Topics   Alcohol  use: No      Allergies  No Known Allergies           Current Outpatient Medications  Medication Sig Dispense Refill   acetaminophen  (TYLENOL ) 500 MG tablet Take 1,000 mg by mouth in the morning.       amLODipine  (NORVASC ) 10 MG tablet TAKE 1 TABLET EVERY DAY (APPOINTMENT IS NEEDED FOR REFILLS) 90 tablet 3   atorvastatin  (LIPITOR) 10 MG tablet Take 1 tablet (10 mg total) by mouth daily. 90 tablet 0   B Complex-C-Folic Acid (DIALYVITE 800) 0.8 MG TABS Take 1 tablet by mouth daily.       Blood Glucose Monitoring Suppl (ACCU-CHEK AVIVA PLUS) w/Device KIT Test blood sugar 3 times daily. Dx code: E11.22 1 kit 0   Blood Pressure Monitoring (BLOOD PRESS MONITOR/M-L CUFF) MISC 1 application by Does not apply route daily. 1 each 0   calcitRIOL  (ROCALTROL ) 0.25 MCG capsule Take  0.25 mcg by mouth Every Tuesday,Thursday,and Saturday with dialysis.       carvedilol  (COREG ) 25 MG tablet TAKE 1 TABLET TWICE DAILY. KEEP OFFICE VISIT 180 tablet 1   Cholecalciferol  (VITAMIN D -3 PO) Take 1 tablet by mouth daily.       Continuous Blood Gluc Sensor (FREESTYLE LIBRE SENSOR SYSTEM) MISC 1 application by Does not apply route daily. 1 each 2   Continuous Glucose Monitor Sup KIT 1 kit by Does not apply route as directed. 1 kit 0   glucose blood (ACCU-CHEK AVIVA PLUS) test strip Test blood sugar 3 times daily. Dx code: E11.22 300 each 3   insulin  NPH-regular Human (NOVOLIN  70/30) (70-30) 100 UNIT/ML injection Inject 8 Units into the skin 2 (two) times daily with a meal.       Insulin  Syringe-Needle U-100 (B-D INS SYR HALF-UNIT .3CC/31G) 31G X 5/16 0.3 ML MISC Use daily at bedtime to inject insulin . Dx code: 250.00 100 each 3   isosorbide  mononitrate (IMDUR ) 60 MG 24 hr tablet TAKE 2 TABLETS EVERY DAY 180 tablet 3   Lancets (ACCU-CHEK MULTICLIX) lancets Test blood sugar 3 times daily. Dx code: E11.22 300 each 3   lidocaine  (LIDODERM ) 5 % Place 1 patch onto the skin daily. Remove & Discard patch within 12 hours or as directed by MD (Patient taking differently: Place 1 patch onto the skin daily as needed (Pain). Remove & Discard patch within 12 hours or as directed by MD) 5 patch 0   melatonin 3 MG TABS tablet Take 1 tablet (3 mg total) by mouth at bedtime. 30 tablet 0   Polyethyl Glycol-Propyl Glycol (SYSTANE) 0.4-0.3 % SOLN Place 1 drop into both eyes as needed (dry eyes).       tamsulosin  (FLOMAX ) 0.4 MG CAPS capsule Take 0.4 mg by mouth daily.  torsemide  (DEMADEX ) 20 MG tablet Take 2 tablets (40 mg total) by mouth 2 (two) times daily. In case of weight gain 2 to 3 lbs in 24 hrs or 5 lbs in 7 days, take one extra tablet a day in the morning, until weight back to baseline. (Patient taking differently: Take 20 mg by mouth 2 (two) times daily. In case of weight gain 2 to 3 lbs in 24 hrs or  5 lbs in 7 days, take one extra tablet a day in the morning, until weight back to baseline.) 80 tablet 0      No current facility-administered medications for this visit.        REVIEW OF SYSTEMS:  [X]  denotes positive finding, [ ]  denotes negative finding Cardiac   Comments:  Chest pain or chest pressure:      Shortness of breath upon exertion:      Short of breath when lying flat:      Irregular heart rhythm:             Vascular      Pain in calf, thigh, or hip brought on by ambulation:      Pain in feet at night that wakes you up from your sleep:       Blood clot in your veins:      Leg swelling:              Pulmonary      Oxygen at home:      Productive cough:       Wheezing:              Neurologic      Sudden weakness in arms or legs:       Sudden numbness in arms or legs:       Sudden onset of difficulty speaking or slurred speech:      Temporary loss of vision in one eye:       Problems with dizziness:              Gastrointestinal      Blood in stool:       Vomited blood:              Genitourinary      Burning when urinating:       Blood in urine:             Psychiatric      Major depression:              Hematologic      Bleeding problems:      Problems with blood clotting too easily:             Skin      Rashes or ulcers:             Constitutional      Fever or chills:          PHYSICAL EXAM: There were no vitals filed for this visit.   GENERAL: The patient is a well-nourished male, in no acute distress. The vital signs are documented above. CARDIAC: There is a regular rate and rhythm.  VASCULAR:  Bilateral radial pulses palpable Bilateral upper arm graft with no thrill Right IJ TDC PULMONARY: No respiratory distress ABDOMEN: Soft and non-tender. MUSCULOSKELETAL: There are no major deformities or cyanosis. NEUROLOGIC: No focal weakness or paresthesias are detected. SKIN: There are no ulcers or rashes noted. PSYCHIATRIC: The patient  has a normal affect.   DATA:  [name]   Assessment/Plan:   89 y.o. male with history of CAD, CHF, hyperlipidemia, diabetes, ESRD that presents for evaluation of thrombosed access with the need for new permanent dialysis access.  Patient has had multiple prior left arm access procedures including a left brachiocephalic fistula, left basilic fistula and left upper arm AV graft.  Most recently had a right upper arm AV graft on 09/24/2023 by Dr. Magda that now thrombosed about 1 month ago.   Given failed bilateral upper extremity access including AV grafts in both arms I have recommended upper extremity venogram to evaluate next best option.  He does not want groin access which I think is reasonable given much higher risk for complication and infection.  Discussed after venogram will hopefully plan permanent dialysis access pending our findings.     Robert DOROTHA Gaskins, MD [facility] Office: [phone]

## 2024-02-12 NOTE — Op Note (Signed)
    Patient name: [name] MRN: [id number] DOB: 1933-08-14 Sex: male  02/12/2024 Pre-operative Diagnosis: End-stage renal disease with the need for permanent hemodialysis access Post-operative diagnosis:  Same Surgeon:  [name], MD Procedure Performed: 1.  Bilateral upper extremity venogram  Indications: 89 year old male with end-stage renal disease having failed bilateral upper extremity AV access.  He presents for bilateral upper extremity venogram for access planning after risks benefits discussed.  Findings:   22-gauge IVs in bilateral hands.  Bilateral upper extremity venogram showed patent brachial vein, axillary vein, subclavian vein, and innominate veins bilaterally.   Procedure:  The patient was identified in the holding area and taken to room 8.  The patient was then placed supine on the table and prepped and draped in the usual sterile fashion.  A time out was called.  Patient already had 22-gauge IVs in both hands.  Initially started with left upper extremity and injected contrast with saline flush in the left hand IV under fluoroscopic guidance with breath-hold.  In the left upper extremity he had patent central veins.  These were patent as noted above.  We then did the same process in the right upper extremity.  Central veins on the right were also patent.  I further discussed options and we will plan right arm access in the near future with redo AV graft.  Taken to holding in stable condition.  Plan: Will schedule new right arm AV graft    [name], MD [facility] Office: [phone]

## 2024-02-13 ENCOUNTER — Encounter (HOSPITAL_COMMUNITY): Payer: Self-pay | Admitting: Vascular Surgery

## 2024-02-13 ENCOUNTER — Other Ambulatory Visit: Payer: Self-pay

## 2024-02-13 DIAGNOSIS — N186 End stage renal disease: Secondary | ICD-10-CM

## 2024-02-17 DIAGNOSIS — N186 End stage renal disease: Secondary | ICD-10-CM | POA: Diagnosis not present

## 2024-02-21 DIAGNOSIS — Z992 Dependence on renal dialysis: Secondary | ICD-10-CM | POA: Diagnosis not present

## 2024-02-21 DIAGNOSIS — N186 End stage renal disease: Secondary | ICD-10-CM | POA: Diagnosis not present

## 2024-02-24 DIAGNOSIS — N186 End stage renal disease: Secondary | ICD-10-CM | POA: Diagnosis not present

## 2024-02-24 DIAGNOSIS — Z992 Dependence on renal dialysis: Secondary | ICD-10-CM | POA: Diagnosis not present

## 2024-02-24 DIAGNOSIS — N2581 Secondary hyperparathyroidism of renal origin: Secondary | ICD-10-CM | POA: Diagnosis not present

## 2024-02-25 ENCOUNTER — Ambulatory Visit (HOSPITAL_COMMUNITY)
Admission: RE | Admit: 2024-02-25 | Discharge: 2024-02-25 | Disposition: A | Discharge status: Home / Self Care | Source: Ambulatory Visit | Attending: Cardiology | Admitting: Cardiology

## 2024-02-25 ENCOUNTER — Ambulatory Visit: Payer: Self-pay | Admitting: Cardiovascular Disease

## 2024-02-25 DIAGNOSIS — I359 Nonrheumatic aortic valve disorder, unspecified: Secondary | ICD-10-CM | POA: Diagnosis not present

## 2024-02-25 LAB — ECHOCARDIOGRAM COMPLETE
AR max vel: 0.74 cm2
AV Area VTI: 0.61 cm2
AV Area mean vel: 0.7 cm2
AV Mean grad: 43 mmHg
AV Peak grad: 72.6 mmHg
Ao pk vel: 4.26 m/s
Area-P 1/2: 2.17 cm2
S' Lateral: 2.83 cm

## 2024-02-26 DIAGNOSIS — N186 End stage renal disease: Secondary | ICD-10-CM | POA: Diagnosis not present

## 2024-02-28 DIAGNOSIS — Z992 Dependence on renal dialysis: Secondary | ICD-10-CM | POA: Diagnosis not present

## 2024-02-28 DIAGNOSIS — N2581 Secondary hyperparathyroidism of renal origin: Secondary | ICD-10-CM | POA: Diagnosis not present

## 2024-03-01 DIAGNOSIS — N186 End stage renal disease: Secondary | ICD-10-CM | POA: Diagnosis not present

## 2024-03-03 ENCOUNTER — Telehealth: Payer: Self-pay | Admitting: Cardiovascular Disease

## 2024-03-03 NOTE — Telephone Encounter (Signed)
 Pt's daughter would like a c/b from a nurse to discuss upcoming appt please advise

## 2024-03-03 NOTE — Telephone Encounter (Signed)
 Shared message from Dr. Court with patient, he verbalized understanding and expressed appreciation for follow-up.

## 2024-03-03 NOTE — Telephone Encounter (Signed)
 Pt daughter called regarding ECHO results. Pt is having graft insertion surgery with Dr. Gretta on 12/3 and cancelled ECHO follow-up on that day. Family concerned if it is safe to proceed with graft surgery and when ECHO follow-up should be rescheduled. Informed daughter that message will be sent to Dr. Court and his nurse for clarification and recommendations. Pt verbalizes understanding.

## 2024-03-06 DIAGNOSIS — Z992 Dependence on renal dialysis: Secondary | ICD-10-CM | POA: Diagnosis not present

## 2024-03-06 DIAGNOSIS — N186 End stage renal disease: Secondary | ICD-10-CM | POA: Diagnosis not present

## 2024-03-06 DIAGNOSIS — N2581 Secondary hyperparathyroidism of renal origin: Secondary | ICD-10-CM | POA: Diagnosis not present

## 2024-03-07 DIAGNOSIS — E1122 Type 2 diabetes mellitus with diabetic chronic kidney disease: Secondary | ICD-10-CM | POA: Diagnosis not present

## 2024-03-07 DIAGNOSIS — N186 End stage renal disease: Secondary | ICD-10-CM | POA: Diagnosis not present

## 2024-03-07 DIAGNOSIS — Z992 Dependence on renal dialysis: Secondary | ICD-10-CM | POA: Diagnosis not present

## 2024-03-09 ENCOUNTER — Other Ambulatory Visit: Payer: Self-pay

## 2024-03-09 ENCOUNTER — Encounter (HOSPITAL_COMMUNITY): Payer: Self-pay | Admitting: Vascular Surgery

## 2024-03-09 NOTE — Progress Notes (Signed)
 Anesthesia Chart Review: SAME DAY WORK-UP  Case: [id number] Date/Time: 03/10/24 1320   Procedure: INSERTION, GRAFT, ARTERIOVENOUS, UPPER EXTREMITY (Right)   Anesthesia type: Choice   Diagnosis: ESRD (end stage renal disease) (HCC) [N18.6]   Pre-op  diagnosis: ESRD   [facility] / [facility]   Surgeons: Gretta Lonni PARAS, MD       DISCUSSION: Patient is an 89 year old male scheduled for the above procedure. By vascular notes, Patient has had multiple prior left arm access procedures including a left brachiocephalic fistula, left basilic fistula and left upper arm AV graft.  Most recently had a right upper arm AV graft on 09/24/2023 by Dr. Magda that now thrombosed about 1 month ago.   Given failed bilateral upper extremity access including AV grafts in both arms I have recommended upper extremity venogram to evaluate next best option.  He does not want groin access which I think is reasonable given much higher risk for complication and infection.  S/p BUE Venogram on 02/12/2024 that showed patent brachial vein, axillary vein, subclavian vein and innominate veins bilaterally.  Plan for new RUE AVGG placement for permanent HD access.   History includes former smoker (quit 05/10/2002), HTN, HLD, DM2, CAD (s/p DES to LAD 2009), diastolic heart failure, aortic stenosis (severe 02/25/2024), moderate aortic stenosis (mean gradient 24 mmHg by echo 02/2023), mild mitral stenosis/regurgitation (02/09/2023 TTE, not noted on 02/25/2024 TTE), pulmonary hypertension (RVSP 53.2 mmHg 02/09/2024 TTE), NSVT (s/p attempted ablation 2010), dyspnea, ESRD (HD initiated 04/2023), anemia, prostate cancer (s/p ADT, radiation), urothelial carcinoma (s/p TURBT 09/18/2019, 08/12/2022) Bladder partial colectomy (for benign polyp removal 1980's in [place]), osteoarthritis (right TKA 03/04/2018).   Last office visit with cardiologist Dr. Court was on 12/31/2023. He is followed for CAD and aortic stenosis. He was active and  asymptomatic. He is on atorvastatin  for HLD which is followed at his dialysis center. HTN controlled at visit on carvedilol , amlodipine , isosorbide . He had attempted VT ablation in 2010 by EP but was aborted because of inappropriate substrate. His last TTE was in 02/2023, so updated echo ordered which was done on 02/25/2024 and showed progression of AS to severe (AVA VTI 0.61 cm. AV mean gradient 43.0 mmHg, AV peak gradient 72.6 mmHg, AV Vmax 4.26 m/s.). Dr. Court has asked him to come back in for an appointment to further discuss management of AS given progression to severe. That visit is scheduled for 03/15/2024. In the interim, patient's daughter did reach out to Dr. Court given surgery plans and concern if he should proceed with dialysis access surgery. Dr. Dessa responded on 03/03/2024, His Ao Stenosis has gotten worse but he was asymptomatic and this should not preclude him from having his vascular access addressed for HD. Will arrange OP follow up to discuss his echo results after the first of the year. He is currently dialyzing via a TDC, so would anticipate that he would likely need permanent hemodialysis access should AV intervention be pursued in the future.  Last A1c 6.0% on 12/15/2023. He is on Novolin  70/30 8 units BID.  Anesthesia team to evaluate on the day of surgery.    VS:  Wt Readings from Last 3 Encounters:  02/12/24 91.6 kg  01/21/24 92.1 kg  12/31/23 92.8 kg   BP Readings from Last 3 Encounters:  02/12/24 (!) 141/53  01/21/24 127/70  12/31/23 128/76   Pulse Readings from Last 3 Encounters:  02/12/24 70  01/21/24 62  12/31/23 75     PROVIDERS: Teresa Rouse  Earnie, MD is PCP  Court Carrier, MD is cardiologist Jerrye Mar, MD is nephrologist Carolee Sherwood MOULD, MD is urologist   LABS: For day of surgery as indicated. As of 12/15/2023, H/H 10.9/33.4, PLT 164, A1c 6.0%, glucose 89, AST 9, ALT 4.   IMAGES: CXR 12/09/2023: FINDINGS: Normal cardiac silhouette.  Central venous congestion. No overt pulmonary edema. No pleural fluid. Pneumothorax or pneumonia IMPRESSION: Central venous congestion without overt pulmonary edema.   EKG: 12/09/2023: NSR   CV: Echo 02/25/2024: IMPRESSIONS   1. Left ventricular ejection fraction, by estimation, is 70 to 75%. The  left ventricle has hyperdynamic function. The left ventricle has no  regional wall motion abnormalities. Left ventricular diastolic parameters  are consistent with Grade II diastolic  dysfunction (pseudonormalization). Elevated left atrial pressure. The  average left ventricular global longitudinal strain is -23.3 %. The global  longitudinal strain is normal.   2. Right ventricular systolic function is normal. The right ventricular  size is normal.   3. Left atrial size was moderately dilated.   4. The mitral valve is normal in structure. No evidence of mitral valve  regurgitation. No evidence of mitral stenosis.   5. The aortic valve is normal in structure. There is moderate  calcification of the aortic valve. There is moderate thickening of the  aortic valve. Aortic valve regurgitation is moderate. Severe aortic valve  stenosis. Aortic valve area, by VTI measures 0.61 cm. Aortic valve mean gradient measures 43.0 mmHg. Aortic valve peak gradient measures 72.6 mmHg. Aortic valve Vmax measures 4.26 m/s.   6. The inferior vena cava is normal in size with greater than 50%  respiratory variability, suggesting right atrial pressure of 3 mmHg.  - Comparison(s): Changes from prior study are noted. Worsening AV disease.  - Comparison TTE 02/09/2023: LVEF 70-75%, no RWMA, mild concentric LVH, grade II DD, normal RV systolic function, moderately elevated PASP, RVSP 53.2 mmHg, moderately dilated atria, mild MR, mild MS, MV peak gradient 11.2 mmHg. mean MV gradient 4.3 mmHg, trivial AR, moderate AS, AR PHT 464 msec, AV mean gradient 24.0 mmHg, AV peak gradient 43.3, AVA VTI 1.09 cm, 38 mm aortic root.      US  Carotid 02/19/2021: Summary:  - Right Carotid: There is no evidence of stenosis in the right ICA. The extracranial vessels were near-normal with only minimal  wall thickening or plaque.  - Left Carotid: Velocities in the left ICA are consistent with a 1-39% stenosis. The extracranial vessels were near-normal with only minimal wall thickening or plaque.  - Vertebrals:  Right vertebral artery demonstrates antegrade flow. Left vertebral artery demonstrates high resistant flow.  - Subclavians: Normal flow hemodynamics were seen in bilateral subclavian arteries.   Low risk stress test in 2013.  Past Medical History:  Diagnosis Date   Allergy     Anemia    CAD (coronary artery disease)    pci to LAD 10/09; stable CAD by cath 05/31/08; myoview 04/11/11- no ishcemia, EF 67%; echo 05/15/09- EF>55%, mod calcification of the aortic valve leaflets   CAD S/P percutaneous coronary angioplasty 05/08/2008   LAD PCI with DES 2009- Myoview low risk 2013   CHF (congestive heart failure) (HCC) 09/07/2019   Chronic back pain    Chronic kidney disease    Cystitis, radiation 12/01/2013   Dyspnea    ED (erectile dysfunction)    Elevated serum creatinine 12/14/2015   Essential hypertension, benign    Hematuria 12/01/2013   History of blood transfusion    Hyperlipidemia  Ileus (HCC) 12/14/2015   Insulin  dependent type 2 diabetes mellitus, controlled (HCC)    Malignant neoplasm of prostate (HCC) 11/26/2013   Multiple rib fractures    left 9th, 10th, and 11th posterior rib fractures S/P fall 12/09/2015/notes 12/12/2015   Osteoarthritis of right knee 03/04/2018   Personal history of COVID-19 09/05/2021   PREMATURE VENTRICULAR CONTRACTIONS 05/08/2008   Qualifier: Diagnosis of  By: Kelsie, MD, James     Prostate cancer [facility])    s/p   Rectal bleeding 12/23/2012   Evaluated by Dr Rollin GI patient with history of radiation proctitis. Patient due for colonoscopy on 04/2014    Type II or unspecified type diabetes  mellitus without mention of complication, not stated as uncontrolled    Ventricular tachycardia (HCC)    resuscitated; monitor 05/2008; attempted T ablation 2/10- aborted due to inappropriate substrate    Past Surgical History:  Procedure Laterality Date   A/V SHUNT INTERVENTION [name] 08/08/2023   Procedure: A/V SHUNT INTERVENTION;  Surgeon: Tobie Gordy POUR, MD;  Location: [facility] INVASIVE CV LAB;  Service: Cardiovascular;  Laterality: [name];   A/V SHUNT INTERVENTION [name] 09/03/2023   Procedure: A/V SHUNT INTERVENTION;  Surgeon: Lanis Fonda BRAVO, MD;  Location: [facility] PV LAB;  Service: Cardiovascular;  Laterality: [name];   AV FISTULA PLACEMENT Left 12/02/2022   Procedure: LEFT ARM BRACHIOCEPHALIC ARTERIOVENOUS (AV) FISTULA CREATION;  Surgeon: Gretta Lonni PARAS, MD;  Location: [facility] OR;  Service: Vascular;  Laterality: Left;   AV FISTULA PLACEMENT Left 01/22/2023   Procedure: LEFT ARM BRACHIOBASILISC ARTERIOVENOUS (AV) FISTULA CREATION;  Surgeon: Gretta Lonni PARAS, MD;  Location[facility];  Service: Vascular;  Laterality: Left;   BASCILIC VEIN TRANSPOSITION Left 05/02/2023   Procedure: Insertion of LEFT Arm Arterio-venous Gortex Graft;  Surgeon: Gretta Lonni PARAS, MD;  Location: [facility] OR;  Service: Vascular;  Laterality: Left;   CARDIAC CATHETERIZATION  05/31/08   EF 55-60%, stable lesions, medical therapy   COLECTOMY  '80's   polyps   CORONARY ANGIOPLASTY WITH STENT PLACEMENT  01/19/08   PCI stent to mid LAD with Promus DES 27.5x28   CYSTOSCOPY WITH DIRECT VISION INTERNAL URETHROTOMY [name] 03/12/2021   Procedure: cystoscoopy withdirect vision internal urethrotomy optilum urethral dilation     ;  Surgeon: Carolee Sherwood JONETTA DOUGLAS, MD;  Location: [facility] ORS;  Service: Urology;  Laterality: [name];  RERQUESTING 45 MINS   DIALYSIS/PERMA CATHETER INSERTION [name] 04/10/2023   Procedure: DIALYSIS/PERMA CATHETER INSERTION;  Surgeon: Tobie Gordy POUR, MD;  Location: [facility] INVASIVE CV LAB;  Service: Cardiovascular;  Laterality: [name];   DIALYSIS/PERMA  CATHETER INSERTION [name] 09/05/2023   Procedure: DIALYSIS/PERMA CATHETER INSERTION;  Surgeon: Serene Gaile ORN, MD;  Location: [facility] LAB;  Service: Cardiovascular;  Laterality: [name];   INSERTION OF ARTERIOVENOUS (AV) ARTEGRAFT ARM Right 09/24/2023   Procedure: INSERTION, GRAFT, ARTERIOVENOUS, UPPER EXTREMITY;  Surgeon: Magda Debby SAILOR, MD;  [facility];  Service: Vascular;  Laterality: Right;   IR GENERIC HISTORICAL  01/03/2016   IR THORACENTESIS ASP PLEURAL SPACE W/IMG GUIDE 01/03/2016 [facility]-INTER[facility] RAD   IR REMOVAL TUN CV CATH W/O FL  11/07/2023   IR TUNNELED CENTRAL VENOUS CATH PLC W IMG  12/09/2023   KNEE ARTHROPLASTY Right 03/04/2018   Procedure: COMPUTER ASSISTED TOTAL KNEE ARTHROPLASTY;  Surgeon: Fidel Rogue, MD;  Location: [facility] ORS;  Service: Orthopedics;  Laterality: Right;   PERIPHERAL VASCULAR THROMBECTOMY  09/03/2023   Procedure: PERIPHERAL VASCULAR THROMBECTOMY;  Surgeon: Lanis Fonda BRAVO, MD;  Location: [facility];  Service: Cardiovascular;;  avf   PTCA  01/2008   TRANSURETHRAL RESECTION OF BLADDER TUMOR [name] 11/10/2020   Procedure: PHYLLIS BOYCE CHARLESETTA GEROLD;  Surgeon: Sherrilee Belvie CROME, MD;  Location: [facility]S;  Service: Urology;  Laterality: [name];   TRANSURETHRAL RESECTION OF BLADDER TUMOR [name] 08/12/2022   Procedure: TRANSURETHRAL RESECTION OF BLADDER TUMOR (TURBT);  Surgeon: Carolee Sherwood JONETTA DOUGLAS, MD;  Location: [facility]S;  Service: Urology;  Laterality: [name];   TRANSURETHRAL RESECTION OF BLADDER TUMOR WITH MITOMYCIN -C [name] 09/17/2021   Procedure: TRANSURETHRAL RESECTION OF BLADDER TUMOR WITH GEMCITABINE ;  Surgeon: Carolee Sherwood JONETTA DOUGLAS, MD;  Location: [facility]S;  Service: Urology;  Laterality: [name];   UPPER EXTREMITY VENOGRAPHY Bilateral 02/12/2024   Procedure: UPPER EXTREMITY VENOGRAPHY;  Surgeon: Gretta Lonni PARAS, MD;  [facility] INVASIVE CV LAB;  Service: Cardiovascular;  Laterality: Bilateral;   VENOUS STENT  09/03/2023   Procedure: VENOUS STENT;  Surgeon: Lanis Fonda BRAVO, MD;   Location: [facility] LAB;  Service: Cardiovascular;;  avf    MEDICATIONS: No current facility-administered medications for this encounter.    acetaminophen  (TYLENOL ) 500 MG tablet   amLODipine  (NORVASC ) 10 MG tablet   atorvastatin  (LIPITOR) 10 MG tablet   B Complex-C-Folic Acid (DIALYVITE 800) 0.8 MG TABS   Blood Glucose Monitoring Suppl (ACCU-CHEK AVIVA PLUS) w/Device KIT   Blood Pressure Monitoring (BLOOD PRESS MONITOR/M-L CUFF) MISC   calcitRIOL  (ROCALTROL ) 0.25 MCG capsule   carvedilol  (COREG ) 25 MG tablet   Cholecalciferol  (VITAMIN D -3 PO)   Continuous Blood Gluc Sensor (FREESTYLE LIBRE SENSOR SYSTEM) MISC   Continuous Glucose Monitor Sup KIT   glucose blood (ACCU-CHEK AVIVA PLUS) test strip   insulin  NPH-regular Human (NOVOLIN  70/30) (70-30) 100 UNIT/ML injection   Insulin  Syringe-Needle U-100 (B-D INS SYR HALF-UNIT .3CC/31G) 31G X 5/16 0.3 ML MISC   isosorbide  mononitrate (IMDUR ) 60 MG 24 hr tablet   Lancets (ACCU-CHEK MULTICLIX) lancets   melatonin 3 MG TABS tablet   Polyethyl Glycol-Propyl Glycol (SYSTANE) 0.4-0.3 % SOLN   sevelamer carbonate (RENVELA) 800 MG tablet   tamsulosin  (FLOMAX ) 0.4 MG CAPS capsule   torsemide  (DEMADEX ) 20 MG tablet    Isaiah Ruder, PA-C Surgical Short Stay/Anesthesiology [facility] Phone [phone] [facility] Phone [phone] 03/09/2024 12:07 PM

## 2024-03-09 NOTE — Anesthesia Preprocedure Evaluation (Signed)
 Anesthesia Evaluation  Patient identified by MRN, date of birth, ID band Patient awake    Reviewed: Allergy  & Precautions, H&P , NPO status , Patient's Chart, lab work & pertinent test results  History of Anesthesia Complications Negative for: history of anesthetic complications  Airway Mallampati: II  TM Distance: >3 FB Neck ROM: Full    Dental no notable dental hx.    Pulmonary neg shortness of breath, former smoker   Pulmonary exam normal breath sounds clear to auscultation       Cardiovascular hypertension, + CAD and + Cardiac Stents  Normal cardiovascular exam+ Valvular Problems/Murmurs AS  Rhythm:Regular Rate:Normal  Echo 02/25/2024: IMPRESSIONS   1. Left ventricular ejection fraction, by estimation, is 70 to 75%. The  left ventricle has hyperdynamic function. The left ventricle has no  regional wall motion abnormalities. Left ventricular diastolic parameters  are consistent with Grade II diastolic  dysfunction (pseudonormalization). Elevated left atrial pressure. The  average left ventricular global longitudinal strain is -23.3 %. The global  longitudinal strain is normal.   2. Right ventricular systolic function is normal. The right ventricular  size is normal.   3. Left atrial size was moderately dilated.   4. The mitral valve is normal in structure. No evidence of mitral valve  regurgitation. No evidence of mitral stenosis.   5. The aortic valve is normal in structure. There is moderate  calcification of the aortic valve. There is moderate thickening of the  aortic valve. Aortic valve regurgitation is moderate. Severe aortic valve  stenosis. Aortic valve area, by VTI measures 0.61 cm. Aortic valve mean gradient measures 43.0 mmHg. Aortic valve peak gradient measures 72.6 mmHg. Aortic valve Vmax measures 4.26 m/s.   6. The inferior vena cava is normal in size with greater than 50%  respiratory variability, suggesting  right atrial pressure of 3 mmHg.  - Comparison(s): Changes from prior study are noted. Worsening AV disease.  - Comparison TTE 02/09/2023: LVEF 70-75%, no RWMA, mild concentric LVH, grade II DD, normal RV systolic function, moderately elevated PASP, RVSP 53.2 mmHg, moderately dilated atria, mild MR, mild MS, MV peak gradient 11.2 mmHg. mean MV gradient 4.3 mmHg, trivial AR, moderate AS, AR PHT 464 msec, AV mean gradient 24.0 mmHg, AV peak gradient 43.3, AVA VTI 1.09 cm, 38 mm aortic root.     Neuro/Psych neg Seizures  negative psych ROS   GI/Hepatic negative GI ROS, Neg liver ROS,neg GERD  ,,  Endo/Other  negative endocrine ROSdiabetes, Type 2    Renal/GU ESRF and DialysisRenal disease   Malignant neoplasm of prostate     Musculoskeletal  (+) Arthritis ,    Abdominal   Peds negative pediatric ROS (+)  Hematology  (+) Blood dyscrasia, anemia   Anesthesia Other Findings   Reproductive/Obstetrics negative OB ROS                              Anesthesia Physical Anesthesia Plan  ASA: 3  Anesthesia Plan: General   Post-op Pain Management:    Induction: Intravenous  PONV Risk Score and Plan: 2 and Ondansetron , Dexamethasone  and Treatment may vary due to age or medical condition  Airway Management Planned: LMA  Additional Equipment: None  Intra-op Plan:   Post-operative Plan: Extubation in OR  Informed Consent: I have reviewed the patients History and Physical, chart, labs and discussed the procedure including the risks, benefits and alternatives for the proposed anesthesia with the patient or  authorized representative who has indicated his/her understanding and acceptance.     Dental advisory given  Plan Discussed with: CRNA  Anesthesia Plan Comments: (PAT note written 03/09/2024 by Shanera Meske, PA-C. Last evaluation by cardiologist Dr. Court was on 12/31/2023 for CAD and AS follow-up. Updated TTE 02/25/2024 showed progression to  severe AS. Patient has been asymptomatic, but he is scheduled to discuss results in more detail with Dr. Court on 03/15/2024. In the interim, patient's daughter did reach out to Dr. Court given surgery plans and concern if he should proceed with dialysis access surgery. Dr. Dessa responded on 03/03/2024, His Ao Stenosis has gotten worse but he was asymptomatic and this should not preclude him from having his vascular access addressed for HD. Will arrange OP follow up to discuss his echo results after the first of the year. He is currently dialyzing via a TDC and needs permanent HD access.)         Anesthesia Quick Evaluation

## 2024-03-09 NOTE — Progress Notes (Signed)
 PCP - Teresa Thersia Jansky, MD  Cardiologist - Court Dorn PARAS, MD   PPM/ICD - denies Device Orders - n/a Rep Notified - n/a  Chest x-ray - 12-09-23 EKG - 12-09-23 Stress Test - 04-11-11 ECHO - 02-25-24 Cardiac Cath - 05-31-2008  CPAP - n/a  GLP-1 -denies  Fasting Blood Sugar - Per patient blood sugar ranges between 99-120 Checks Blood Sugar Has a Dexcom G-7. Patient has removed it and replace it after procedure.  Patient made aware to monitor blood sugar A1C on 12-15-23 6.0  Blood Thinner Instructions: denies Aspirin  Instructions: denies  ERAS Protcol - NPO  COVID TEST- n/a  Anesthesia review: Yes HX of HTN, CHF, DM, CAD, CKD  Patient verbally denies any shortness of breath, fever, cough and chest pain during phone call   -------------  SDW INSTRUCTIONS given:  Your procedure is scheduled on March 10, 2024.  Report to [facility] Main Entrance A at 11:00 A.M., and check in at the Admitting office.  Call this number if you have problems the morning of surgery:  [phone]   Remember:  Do not eat or drink after midnight the night before your surgery     Take these medicines the morning of surgery with A SIP OF WATER   acetaminophen  (TYLENOL )  amLODipine  (NORVASC )  atorvastatin  (LIPITOR)  carvedilol  (COREG )  isosorbide  mononitrate (IMDUR )  tamsulosin  (FLOMAX )   WHAT DO I DO ABOUT MY DIABETES MEDICATION?   Do not take oral diabetes medicines (pills) the morning of surgery.  THE NIGHT BEFORE SURGERY, take 5.5 units of  NPH-regular Human (NOVOLIN  70/30) insulin .      THE MORNING OF SURGERY,DO NOT TAKE insulin  NPH-regular Human (NOVOLIN  70/30)   The day of surgery, do not take other diabetes injectables, including Byetta (exenatide), Bydureon (exenatide ER), Victoza (liraglutide), or Trulicity (dulaglutide).  If your CBG is greater than 220 mg/dL, you may take  of your sliding scale (correction) dose of insulin .   HOW TO MANAGE YOUR DIABETES BEFORE AND  AFTER SURGERY  Why is it important to control my blood sugar before and after surgery? Improving blood sugar levels before and after surgery helps healing and can limit problems. A way of improving blood sugar control is eating a healthy diet by:  Eating less sugar and carbohydrates  Increasing activity/exercise  Talking with your doctor about reaching your blood sugar goals High blood sugars (greater than 180 mg/dL) can raise your risk of infections and slow your recovery, so you will need to focus on controlling your diabetes during the weeks before surgery. Make sure that the doctor who takes care of your diabetes knows about your planned surgery including the date and location.  How do I manage my blood sugar before surgery? Check your blood sugar at least 4 times a day, starting 2 days before surgery, to make sure that the level is not too high or low.  Check your blood sugar the morning of your surgery when you wake up and every 2 hours until you get to the Short Stay unit.  If your blood sugar is less than 70 mg/dL, you will need to treat for low blood sugar: Do not take insulin . Treat a low blood sugar (less than 70 mg/dL) with  cup of clear juice (cranberry or apple), 4 glucose tablets, OR glucose gel. Recheck blood sugar in 15 minutes after treatment (to make sure it is greater than 70 mg/dL). If your blood sugar is not greater than 70 mg/dL on recheck, call [phone]  for further instructions. Report your blood sugar to the short stay nurse when you get to Short Stay.  If you are admitted to the hospital after surgery: Your blood sugar will be checked by the staff and you will probably be given insulin  after surgery (instead of oral diabetes medicines) to make sure you have good blood sugar levels. The goal for blood sugar control after surgery is 80-180 mg/dL. 5  As of today, STOP taking any Aspirin  (unless otherwise instructed by your surgeon) Aleve , Naproxen , Ibuprofen,  Motrin, Advil, Goody's, BC's, all herbal medications, fish oil, and all vitamins.                      Do not wear jewelry, make up, or nail polish            Do not wear lotions, powders, perfumes/colognes, or deodorant.            Do not shave 48 hours prior to surgery.  Men may shave face and neck.            Do not bring valuables to the hospital.            [facility] is not responsible for any belongings or valuables.  Do NOT Smoke (Tobacco/Vaping) 24 hours prior to your procedure If you use a CPAP at night, you may bring all equipment for your overnight stay.   Contacts, glasses, dentures or bridgework may not be worn into surgery.      For patients admitted to the hospital, discharge time will be determined by your treatment team.   Patients discharged the day of surgery will not be allowed to drive home, and someone needs to stay with them for 24 hours.    Special instructions:   [place]- Preparing For Surgery  Before surgery, you can play an important role. Because skin is not sterile, your skin needs to be as free of germs as possible. You can reduce the number of germs on your skin by washing with CHG (chlorahexidine gluconate) Soap before surgery.  CHG is an antiseptic cleaner which kills germs and bonds with the skin to continue killing germs even after washing.    Oral Hygiene is also important to reduce your risk of infection.  Remember - BRUSH YOUR TEETH THE MORNING OF SURGERY WITH YOUR REGULAR TOOTHPASTE  Please do not use if you have an allergy  to CHG or antibacterial soaps. If your skin becomes reddened/irritated stop using the CHG.  Do not shave (including legs and underarms) for at least 48 hours prior to first CHG shower. It is OK to shave your face.  Please follow these instructions carefully.   Shower the NIGHT BEFORE SURGERY and the MORNING OF SURGERY with DIAL  Soap.   Pat yourself dry with a CLEAN TOWEL.  Wear CLEAN PAJAMAS to bed the night before  surgery  Place CLEAN SHEETS on your bed the night of your first shower and DO NOT SLEEP WITH PETS.   Day of Surgery: Please shower morning of surgery  Wear Clean/Comfortable clothing the morning of surgery Do not apply any deodorants/lotions.   Remember to brush your teeth WITH YOUR REGULAR TOOTHPASTE.   Questions were answered. Patient verbalized understanding of instructions.

## 2024-03-10 ENCOUNTER — Ambulatory Visit (HOSPITAL_COMMUNITY): Payer: Self-pay | Admitting: Vascular Surgery

## 2024-03-10 ENCOUNTER — Ambulatory Visit: Admitting: Cardiovascular Disease

## 2024-03-10 ENCOUNTER — Ambulatory Visit (HOSPITAL_COMMUNITY)
Admission: RE | Admit: 2024-03-10 | Discharge: 2024-03-10 | Disposition: A | Discharge status: Home / Self Care | Attending: Vascular Surgery | Admitting: Vascular Surgery

## 2024-03-10 ENCOUNTER — Encounter (HOSPITAL_COMMUNITY): Payer: Self-pay | Admitting: Vascular Surgery

## 2024-03-10 ENCOUNTER — Encounter (HOSPITAL_COMMUNITY)
Admission: RE | Disposition: A | Discharge status: Home / Self Care | Payer: Self-pay | Source: Home / Self Care | Attending: Vascular Surgery

## 2024-03-10 ENCOUNTER — Other Ambulatory Visit (HOSPITAL_COMMUNITY): Payer: Self-pay

## 2024-03-10 DIAGNOSIS — N186 End stage renal disease: Secondary | ICD-10-CM | POA: Diagnosis not present

## 2024-03-10 DIAGNOSIS — D631 Anemia in chronic kidney disease: Secondary | ICD-10-CM | POA: Diagnosis not present

## 2024-03-10 DIAGNOSIS — Z87891 Personal history of nicotine dependence: Secondary | ICD-10-CM | POA: Diagnosis not present

## 2024-03-10 DIAGNOSIS — I132 Hypertensive heart and chronic kidney disease with heart failure and with stage 5 chronic kidney disease, or end stage renal disease: Secondary | ICD-10-CM | POA: Diagnosis not present

## 2024-03-10 DIAGNOSIS — E1122 Type 2 diabetes mellitus with diabetic chronic kidney disease: Secondary | ICD-10-CM | POA: Diagnosis not present

## 2024-03-10 DIAGNOSIS — I509 Heart failure, unspecified: Secondary | ICD-10-CM | POA: Diagnosis not present

## 2024-03-10 HISTORY — PX: INSERTION OF ARTERIOVENOUS (AV) ARTEGRAFT ARM: SHX6779

## 2024-03-10 LAB — POCT I-STAT, CHEM 8
BUN: 35 mg/dL — ABNORMAL HIGH (ref 8–23)
Calcium, Ion: 1.13 mmol/L — ABNORMAL LOW (ref 1.15–1.40)
Chloride: 99 mmol/L (ref 98–111)
Creatinine, Ser: 8.6 mg/dL — ABNORMAL HIGH (ref 0.61–1.24)
Glucose, Bld: 117 mg/dL — ABNORMAL HIGH (ref 70–99)
HCT: 34 % — ABNORMAL LOW (ref 39.0–52.0)
Hemoglobin: 11.6 g/dL — ABNORMAL LOW (ref 13.0–17.0)
Potassium: 4.6 mmol/L (ref 3.5–5.1)
Sodium: 134 mmol/L — ABNORMAL LOW (ref 135–145)
TCO2: 26 mmol/L (ref 22–32)

## 2024-03-10 LAB — GLUCOSE, CAPILLARY
Glucose-Capillary: 125 mg/dL — ABNORMAL HIGH (ref 70–99)
Glucose-Capillary: 128 mg/dL — ABNORMAL HIGH (ref 70–99)

## 2024-03-10 SURGERY — INSERTION, GRAFT, ARTERIOVENOUS, UPPER EXTREMITY
Anesthesia: General | Laterality: Right

## 2024-03-10 MED ORDER — PHENYLEPHRINE HCL (PRESSORS) 10 MG/ML IV SOLN
INTRAVENOUS | Status: AC
  Filled 2024-03-10: qty 1

## 2024-03-10 MED ORDER — CHLORHEXIDINE GLUCONATE 4 % EX SOLN: 60.0000 mL | Freq: Once | CUTANEOUS | Status: DC

## 2024-03-10 MED ORDER — HEPARIN SODIUM (PORCINE) 1000 UNIT/ML IJ SOLN
INTRAMUSCULAR | Status: DC | PRN
  Administered 2024-03-10: 5000 [IU] via INTRAVENOUS

## 2024-03-10 MED ORDER — ORAL CARE MOUTH RINSE: 15.0000 mL | Freq: Once | OROMUCOSAL | Status: AC

## 2024-03-10 MED ORDER — VASOPRESSIN 20 UNIT/ML IV SOLN
INTRAVENOUS | Status: AC
  Filled 2024-03-10: qty 1

## 2024-03-10 MED ORDER — HEPARIN 6000 UNIT IRRIGATION SOLUTION
Status: AC
  Filled 2024-03-10: qty 500

## 2024-03-10 MED ORDER — OXYCODONE-ACETAMINOPHEN 5-325 MG PO TABS
1.0000 | ORAL_TABLET | Freq: Four times a day (QID) | ORAL | 0 refills | Status: DC | PRN
  Filled 2024-03-10: qty 20, 5d supply, fill #0

## 2024-03-10 MED ORDER — PHENYLEPHRINE HCL-NACL 20-0.9 MG/250ML-% IV SOLN
INTRAVENOUS | Status: DC | PRN
  Administered 2024-03-10: 50 ug/min via INTRAVENOUS

## 2024-03-10 MED ORDER — FENTANYL CITRATE (PF) 100 MCG/2ML IJ SOLN
INTRAMUSCULAR | Status: AC
  Filled 2024-03-10: qty 2

## 2024-03-10 MED ORDER — OXYCODONE HCL 5 MG PO TABS
5.0000 mg | ORAL_TABLET | Freq: Once | ORAL | Status: AC | PRN
  Administered 2024-03-10: 5 mg via ORAL

## 2024-03-10 MED ORDER — ACETAMINOPHEN 10 MG/ML IV SOLN: 1000.0000 mg | Freq: Once | INTRAVENOUS | Status: DC | PRN

## 2024-03-10 MED ORDER — DEXAMETHASONE SOD PHOSPHATE PF 10 MG/ML IJ SOLN
INTRAMUSCULAR | Status: DC | PRN
  Administered 2024-03-10: 10 mg via INTRAVENOUS

## 2024-03-10 MED ORDER — HEPARIN 6000 UNIT IRRIGATION SOLUTION
Status: DC | PRN
  Administered 2024-03-10: 1

## 2024-03-10 MED ORDER — CHLORHEXIDINE GLUCONATE 0.12 % MT SOLN: 15.0000 mL | Freq: Once | OROMUCOSAL | Status: AC

## 2024-03-10 MED ORDER — INSULIN ASPART 100 UNIT/ML IJ SOLN: 0.0000 [IU] | INTRAMUSCULAR | Status: DC | PRN

## 2024-03-10 MED ORDER — ONDANSETRON HCL 4 MG/2ML IJ SOLN
INTRAMUSCULAR | Status: DC | PRN
  Administered 2024-03-10: 4 mg via INTRAVENOUS

## 2024-03-10 MED ORDER — DROPERIDOL 2.5 MG/ML IJ SOLN: 0.6250 mg | Freq: Once | INTRAMUSCULAR | Status: DC | PRN

## 2024-03-10 MED ORDER — LIDOCAINE 2% (20 MG/ML) 5 ML SYRINGE
INTRAMUSCULAR | Status: DC | PRN
  Administered 2024-03-10: 100 mg via INTRAVENOUS

## 2024-03-10 MED ORDER — SODIUM CHLORIDE 0.9 % IV SOLN: INTRAVENOUS | Status: DC

## 2024-03-10 MED ORDER — 0.9 % SODIUM CHLORIDE (POUR BTL) OPTIME
TOPICAL | Status: DC | PRN
  Administered 2024-03-10: 1000 mL

## 2024-03-10 MED ORDER — PHENYLEPHRINE 80 MCG/ML (10ML) SYRINGE FOR IV PUSH (FOR BLOOD PRESSURE SUPPORT)
PREFILLED_SYRINGE | INTRAVENOUS | Status: DC | PRN
  Administered 2024-03-10: 80 ug via INTRAVENOUS
  Administered 2024-03-10: 400 ug via INTRAVENOUS
  Administered 2024-03-10 (×2): 160 ug via INTRAVENOUS
  Administered 2024-03-10: 80 ug via INTRAVENOUS
  Administered 2024-03-10: 560 ug via INTRAVENOUS

## 2024-03-10 MED ORDER — PROPOFOL 10 MG/ML IV BOLUS
INTRAVENOUS | Status: DC | PRN
  Administered 2024-03-10: 100 mg via INTRAVENOUS

## 2024-03-10 MED ORDER — ALBUMIN HUMAN 5 % IV SOLN: INTRAVENOUS | Status: DC | PRN

## 2024-03-10 MED ORDER — FENTANYL CITRATE (PF) 100 MCG/2ML IJ SOLN: 25.0000 ug | INTRAMUSCULAR | Status: DC | PRN

## 2024-03-10 MED ORDER — CHLORHEXIDINE GLUCONATE 0.12 % MT SOLN
OROMUCOSAL | Status: AC
  Administered 2024-03-10: 15 mL via OROMUCOSAL
  Filled 2024-03-10: qty 15

## 2024-03-10 MED ORDER — OXYCODONE HCL 5 MG PO TABS
ORAL_TABLET | ORAL | Status: AC
  Filled 2024-03-10: qty 1

## 2024-03-10 MED ORDER — FENTANYL CITRATE (PF) 250 MCG/5ML IJ SOLN
INTRAMUSCULAR | Status: DC | PRN
  Administered 2024-03-10: 25 ug via INTRAVENOUS
  Administered 2024-03-10: 50 ug via INTRAVENOUS
  Administered 2024-03-10: 25 ug via INTRAVENOUS

## 2024-03-10 MED ORDER — VASOPRESSIN 20 UNIT/ML IV SOLN
INTRAVENOUS | Status: DC | PRN
  Administered 2024-03-10 (×12): 1 [IU] via INTRAVENOUS

## 2024-03-10 MED ORDER — CEFAZOLIN SODIUM-DEXTROSE 2-4 GM/100ML-% IV SOLN
2.0000 g | INTRAVENOUS | Status: AC
  Administered 2024-03-10: 2 g via INTRAVENOUS
  Filled 2024-03-10: qty 100

## 2024-03-10 MED ORDER — OXYCODONE HCL 5 MG/5ML PO SOLN: 5.0000 mg | Freq: Once | ORAL | Status: AC | PRN

## 2024-03-10 SURGICAL SUPPLY — 30 items
ARMBAND PINK RESTRICT EXTREMIT (MISCELLANEOUS) ×2 IMPLANT
BAG COUNTER SPONGE SURGICOUNT (BAG) ×1 IMPLANT
BLADE CLIPPER SURG (BLADE) ×1 IMPLANT
CANISTER SUCTION 3000ML PPV (SUCTIONS) ×1 IMPLANT
CLIP TI MEDIUM 6 (CLIP) ×1 IMPLANT
CLIP TI WIDE RED SMALL 6 (CLIP) ×1 IMPLANT
COVER PROBE W GEL 5X96 (DRAPES) ×1 IMPLANT
DERMABOND ADVANCED .7 DNX12 (GAUZE/BANDAGES/DRESSINGS) ×1 IMPLANT
ELECTRODE REM PT RTRN 9FT ADLT (ELECTROSURGICAL) ×1 IMPLANT
GLOVE BIO SURGEON STRL SZ7.5 (GLOVE) ×1 IMPLANT
GLOVE BIOGEL PI IND STRL 8 (GLOVE) ×1 IMPLANT
GOWN STRL REUS W/ TWL LRG LVL3 (GOWN DISPOSABLE) ×2 IMPLANT
GOWN STRL REUS W/ TWL XL LVL3 (GOWN DISPOSABLE) ×2 IMPLANT
GRAFT GORETEX STRT 4-7X45 (Vascular Products) IMPLANT
KIT BASIN OR (CUSTOM PROCEDURE TRAY) ×1 IMPLANT
KIT TURNOVER KIT B (KITS) ×1 IMPLANT
PACK CV ACCESS (CUSTOM PROCEDURE TRAY) ×1 IMPLANT
PAD ARMBOARD POSITIONER FOAM (MISCELLANEOUS) ×2 IMPLANT
SLING ARM FOAM STRAP LRG (SOFTGOODS) IMPLANT
SOLN 0.9% NACL POUR BTL 1000ML (IV SOLUTION) ×1 IMPLANT
SOLN STERILE WATER BTL 1000 ML (IV SOLUTION) ×1 IMPLANT
SPIKE FLUID TRANSFER (MISCELLANEOUS) ×1 IMPLANT
SPONGE T-LAP 18X18 ~~LOC~~+RFID (SPONGE) IMPLANT
SUT MNCRL AB 4-0 PS2 18 (SUTURE) ×1 IMPLANT
SUT PROLENE 6 0 BV (SUTURE) ×1 IMPLANT
SUT PROLENE 7 0 BV 1 (SUTURE) IMPLANT
SUT SILK 2 0 PERMA HAND 18 BK (SUTURE) IMPLANT
SUT VIC AB 3-0 SH 27X BRD (SUTURE) ×2 IMPLANT
TOWEL GREEN STERILE (TOWEL DISPOSABLE) ×1 IMPLANT
UNDERPAD 30X36 HEAVY ABSORB (UNDERPADS AND DIAPERS) ×1 IMPLANT

## 2024-03-10 NOTE — H&P (Signed)
 Patient name: [name]        MRN: [id number]        DOB: 05-14-1933        Sex: male   REASON FOR VISIT: new access needed   HPI: [name] is a 89 y.o. male with history of CAD, CHF, hyperlipidemia, diabetes, ESRD that presents for evaluation of thrombosed access with the need for new permanent dialysis access.  Patient has had multiple prior left arm access procedures including a left brachiocephalic fistula, left basilic fistula and left upper arm AV graft.  Most recently had a right upper arm AV graft on 09/24/2023 by Dr. Magda.   He states his right arm graft thrombosed about 1 month ago after 3 sessions.  He is right-handed.  Using a right IJ TDC.       Past Medical History:  Diagnosis Date   Allergy      Anemia     CAD (coronary artery disease)      pci to LAD 10/09; stable CAD by cath 05/31/08; myoview 04/11/11- no ishcemia, EF 67%; echo 05/15/09- EF>55%, mod calcification of the aortic valve leaflets   CAD S/P percutaneous coronary angioplasty 05/08/2008    LAD PCI with DES 2009- Myoview low risk 2013   CHF (congestive heart failure) (HCC) 09/07/2019   Chronic back pain     Chronic kidney disease     Cystitis, radiation 12/01/2013   Dyspnea     ED (erectile dysfunction)     Elevated serum creatinine 12/14/2015   Essential hypertension, benign     Hematuria 12/01/2013   History of blood transfusion     Hyperlipidemia     Ileus (HCC) 12/14/2015   Insulin  dependent type 2 diabetes mellitus, controlled (HCC)     Malignant neoplasm of prostate (HCC) 11/26/2013   Multiple rib fractures      left 9th, 10th, and 11th posterior rib fractures S/P fall 12/09/2015/notes 12/12/2015   Osteoarthritis of right knee 03/04/2018   Personal history of COVID-19 09/05/2021   PREMATURE VENTRICULAR CONTRACTIONS 05/08/2008    Qualifier: Diagnosis of  By: Kelsie, MD, Robert     Prostate cancer [facility])      s/p   Rectal bleeding 12/23/2012    Evaluated by Dr Rollin GI patient with history  of radiation proctitis. Patient due for colonoscopy on 04/2014    Type II or unspecified type diabetes mellitus without mention of complication, not stated as uncontrolled     Ventricular tachycardia (HCC)      resuscitated; monitor 05/2008; attempted T ablation 2/10- aborted due to inappropriate substrate               Past Surgical History:  Procedure Laterality Date   A/V SHUNT INTERVENTION [name] 08/08/2023    Procedure: A/V SHUNT INTERVENTION;  Surgeon: Tobie Gordy POUR, MD;  Location: [facility] INVASIVE CV LAB;  Service: Cardiovascular;  Laterality: [name];   A/V SHUNT INTERVENTION [name] 09/03/2023    Procedure: A/V SHUNT INTERVENTION;  Surgeon: Lanis Fonda BRAVO, MD;  Location: [facility] PV LAB;  Service: Cardiovascular;  Laterality: [name];   AV FISTULA PLACEMENT Left 12/02/2022    Procedure: LEFT ARM BRACHIOCEPHALIC ARTERIOVENOUS (AV) FISTULA CREATION;  Surgeon: Gretta Lonni PARAS, MD;  Location: [facility] OR;  Service: Vascular;  Laterality: Left;   AV FISTULA PLACEMENT Left 01/22/2023    Procedure: LEFT ARM BRACHIOBASILISC ARTERIOVENOUS (AV) FISTULA CREATION;  Surgeon: Gretta Lonni PARAS, MD;  Location: [facility] OR;  Service: Vascular;  Laterality: Left;  BASCILIC VEIN TRANSPOSITION Left 05/02/2023    Procedure: Insertion of LEFT Arm Arterio-venous Gortex Graft;  Surgeon: Gretta Lonni PARAS, MD;  Location: [facility] OR;  Service: Vascular;  Laterality: Left;   CARDIAC CATHETERIZATION   05/31/08    EF 55-60%, stable lesions, medical therapy   COLECTOMY   '80's    polyps   CORONARY ANGIOPLASTY WITH STENT PLACEMENT   01/19/08    PCI stent to mid LAD with Promus DES 27.5x28   CYSTOSCOPY WITH DIRECT VISION INTERNAL URETHROTOMY [name] 03/12/2021    Procedure: cystoscoopy withdirect vision internal urethrotomy optilum urethral dilation     ;  Surgeon: Carolee Sherwood JONETTA DOUGLAS, MD;  Location: [facility] ORS;  Service: Urology;  Laterality: [name];  RERQUESTING 45 MINS   DIALYSIS/PERMA CATHETER INSERTION [name] 04/10/2023    Procedure: DIALYSIS/PERMA CATHETER  INSERTION;  Surgeon: Tobie Gordy POUR, MD;  Location: [facility] INVASIVE CV LAB;  Service: Cardiovascular;  Laterality: [name];   DIALYSIS/PERMA CATHETER INSERTION [name] 09/05/2023    Procedure: DIALYSIS/PERMA CATHETER INSERTION;  Surgeon: Serene Gaile ORN, MD;  Location: [facility];  Service: Cardiovascular;  Laterality: [name];   INSERTION OF ARTERIOVENOUS (AV) ARTEGRAFT ARM Right 09/24/2023    Procedure: INSERTION, GRAFT, ARTERIOVENOUS, UPPER EXTREMITY;  Surgeon: Magda Debby SAILOR, MD;  Location: [facility] OR;  Service: Vascular;  Laterality: Right;   IR GENERIC HISTORICAL   01/03/2016    IR THORACENTESIS ASP PLEURAL SPACE W/IMG GUIDE 01/03/2016 [facility]-INTERV RAD   IR REMOVAL TUN CV CATH W/O FL   11/07/2023   IR TUNNELED CENTRAL VENOUS CATH PLC W IMG   12/09/2023   KNEE ARTHROPLASTY Right 03/04/2018    Procedure: COMPUTER ASSISTED TOTAL KNEE ARTHROPLASTY;  Surgeon: Fidel Rogue, MD;  Location: [facility] ORS;  Service: Orthopedics;  Laterality: Right;   PERIPHERAL VASCULAR THROMBECTOMY   09/03/2023    Procedure: PERIPHERAL VASCULAR THROMBECTOMY;  Surgeon: Lanis Fonda BRAVO, MD;  Location: [facility];  Service: Cardiovascular;;  avf   PTCA   01/2008   TRANSURETHRAL RESECTION OF BLADDER TUMOR [name] 11/10/2020    Procedure: PHYLLIS BOYCE CHARLESETTA GEROLD;  Surgeon: Sherrilee Belvie CROME, MD;  Location: [facility] ORS;  Service: Urology;  Laterality: [name];   TRANSURETHRAL RESECTION OF BLADDER TUMOR [name] 08/12/2022    Procedure: TRANSURETHRAL RESECTION OF BLADDER TUMOR (TURBT);  Surgeon: Carolee Sherwood JONETTA DOUGLAS, MD;  Location: [facility] ORS;  Service: Urology;  Laterality: [name];   TRANSURETHRAL RESECTION OF BLADDER TUMOR WITH MITOMYCIN -C [name] 09/17/2021    Procedure: TRANSURETHRAL RESECTION OF BLADDER TUMOR WITH GEMCITABINE ;  Surgeon: Carolee Sherwood JONETTA DOUGLAS, MD;  Location: [facility] ORS;  Service: Urology;  Laterality: [name];   VENOUS STENT   09/03/2023    Procedure: VENOUS STENT;  Surgeon: Lanis Fonda BRAVO, MD;  Location: [facility];  Service: Cardiovascular;;  avf                Family History  Problem Relation Age of Onset   Diabetes Mother     Heart attack Mother     Leukemia Father     Heart disease Sister     Heart disease Brother     Diabetes Brother          pancreatic cancer          SOCIAL HISTORY: Social History         Tobacco Use   Smoking status: Former      Current packs/day: 0.00      Types: Cigarettes      Quit date: 05/10/2002  Years since quitting: 21.7   Smokeless tobacco: Never  Substance Use Topics   Alcohol  use: No      Allergies  No Known Allergies           Current Outpatient Medications  Medication Sig Dispense Refill   acetaminophen  (TYLENOL ) 500 MG tablet Take 1,000 mg by mouth in the morning.       amLODipine  (NORVASC ) 10 MG tablet TAKE 1 TABLET EVERY DAY (APPOINTMENT IS NEEDED FOR REFILLS) 90 tablet 3   atorvastatin  (LIPITOR) 10 MG tablet Take 1 tablet (10 mg total) by mouth daily. 90 tablet 0   B Complex-C-Folic Acid (DIALYVITE 800) 0.8 MG TABS Take 1 tablet by mouth daily.       Blood Glucose Monitoring Suppl (ACCU-CHEK AVIVA PLUS) w/Device KIT Test blood sugar 3 times daily. Dx code: E11.22 1 kit 0   Blood Pressure Monitoring (BLOOD PRESS MONITOR/M-L CUFF) MISC 1 application by Does not apply route daily. 1 each 0   calcitRIOL  (ROCALTROL ) 0.25 MCG capsule Take 0.25 mcg by mouth Every Tuesday,Thursday,and Saturday with dialysis.       carvedilol  (COREG ) 25 MG tablet TAKE 1 TABLET TWICE DAILY. KEEP OFFICE VISIT 180 tablet 1   Cholecalciferol  (VITAMIN D -3 PO) Take 1 tablet by mouth daily.       Continuous Blood Gluc Sensor (FREESTYLE LIBRE SENSOR SYSTEM) MISC 1 application by Does not apply route daily. 1 each 2   Continuous Glucose Monitor Sup KIT 1 kit by Does not apply route as directed. 1 kit 0   glucose blood (ACCU-CHEK AVIVA PLUS) test strip Test blood sugar 3 times daily. Dx code: E11.22 300 each 3   insulin  NPH-regular Human (NOVOLIN  70/30) (70-30) 100 UNIT/ML injection Inject 8 Units into the skin  2 (two) times daily with a meal.       Insulin  Syringe-Needle U-100 (B-D INS SYR HALF-UNIT .3CC/31G) 31G X 5/16 0.3 ML MISC Use daily at bedtime to inject insulin . Dx code: 250.00 100 each 3   isosorbide  mononitrate (IMDUR ) 60 MG 24 hr tablet TAKE 2 TABLETS EVERY DAY 180 tablet 3   Lancets (ACCU-CHEK MULTICLIX) lancets Test blood sugar 3 times daily. Dx code: E11.22 300 each 3   lidocaine  (LIDODERM ) 5 % Place 1 patch onto the skin daily. Remove & Discard patch within 12 hours or as directed by MD (Patient taking differently: Place 1 patch onto the skin daily as needed (Pain). Remove & Discard patch within 12 hours or as directed by MD) 5 patch 0   melatonin 3 MG TABS tablet Take 1 tablet (3 mg total) by mouth at bedtime. 30 tablet 0   Polyethyl Glycol-Propyl Glycol (SYSTANE) 0.4-0.3 % SOLN Place 1 drop into both eyes as needed (dry eyes).       tamsulosin  (FLOMAX ) 0.4 MG CAPS capsule Take 0.4 mg by mouth daily.       torsemide  (DEMADEX ) 20 MG tablet Take 2 tablets (40 mg total) by mouth 2 (two) times daily. In case of weight gain 2 to 3 lbs in 24 hrs or 5 lbs in 7 days, take one extra tablet a day in the morning, until weight back to baseline. (Patient taking differently: Take 20 mg by mouth 2 (two) times daily. In case of weight gain 2 to 3 lbs in 24 hrs or 5 lbs in 7 days, take one extra tablet a day in the morning, until weight back to baseline.) 80 tablet 0      No current facility-administered medications  for this visit.        REVIEW OF SYSTEMS:  [X]  denotes positive finding, [ ]  denotes negative finding Cardiac   Comments:  Chest pain or chest pressure:      Shortness of breath upon exertion:      Short of breath when lying flat:      Irregular heart rhythm:             Vascular      Pain in calf, thigh, or hip brought on by ambulation:      Pain in feet at night that wakes you up from your sleep:       Blood clot in your veins:      Leg swelling:              Pulmonary       Oxygen at home:      Productive cough:       Wheezing:              Neurologic      Sudden weakness in arms or legs:       Sudden numbness in arms or legs:       Sudden onset of difficulty speaking or slurred speech:      Temporary loss of vision in one eye:       Problems with dizziness:              Gastrointestinal      Blood in stool:       Vomited blood:              Genitourinary      Burning when urinating:       Blood in urine:             Psychiatric      Major depression:              Hematologic      Bleeding problems:      Problems with blood clotting too easily:             Skin      Rashes or ulcers:             Constitutional      Fever or chills:          PHYSICAL EXAM: There were no vitals filed for this visit.   GENERAL: The patient is a well-nourished male, in no acute distress. The vital signs are documented above. CARDIAC: There is a regular rate and rhythm.  VASCULAR:  Bilateral radial pulses palpable Bilateral upper arm graft with no thrill Right IJ TDC PULMONARY: No respiratory distress ABDOMEN: Soft and non-tender. MUSCULOSKELETAL: There are no major deformities or cyanosis. NEUROLOGIC: No focal weakness or paresthesias are detected. SKIN: There are no ulcers or rashes noted. PSYCHIATRIC: The patient has a normal affect.   DATA:    [name]   Assessment/Plan:   89 y.o. male with history of CAD, CHF, hyperlipidemia, diabetes, ESRD that presents for evaluation of thrombosed access with the need for new permanent dialysis access.  Patient has had multiple prior left arm access procedures including a left brachiocephalic fistula, left basilic fistula and left upper arm AV graft.  Most recently had a right upper arm AV graft on 09/24/2023 by Dr. Magda that now thrombosed about 1 month ago.  Patient recently underwent upper extremity venogram on 02/12/2024.  Plan right upper arm redo AV graft.  Discussed he will need to continue using  his  catheter for 1 month before the graft will be usable.     [name], MD [facility] [place] Office: [phone]

## 2024-03-10 NOTE — Op Note (Signed)
 OPERATIVE NOTE   DATE: March 10, 2024  PROCEDURE:  right upper arm arteriovenous graft (4 mm x 7 mm tapered gore-tex graft)   PRE-OPERATIVE DIAGNOSIS: end stage renal disease   POST-OPERATIVE DIAGNOSIS: same  SURGEON: [name], MD  ASSISTANT(S): Lucie Apt, PA  ANESTHESIA: LMA  ESTIMATED BLOOD LOSS: Minimal  FINDING(S): Palpable thrill at end of case Dopplerable radial signal at end of case.  SPECIMEN(S): None  INDICATIONS:   [name] is a 89 y.o. male who presents with ESRD and the need for permanent hemodialysis access.  The patient is scheduled for re-do right arm AVG.  Risk, benefits, and alternatives to access surgery were discussed.  The patient is aware the risks include but are not limited to: bleeding, infection, steal syndrome, nerve damage, ischemic monomelic neuropathy, failure to mature, and need for additional procedures.  The patient is aware of the risks and elects to proceed forward.  An assistant was needed given the complexity of the case and also for sewing the arterial and venous anastomosis.  DESCRIPTION: After full informed written consent was obtained from the patient, the patient was brought back to the operating room and placed supine upon the operating table.  The patient was given IV antibiotics prior to proceeding.  After obtaining adequate sedation, the patient was prepped and draped in standard fashion for a right arm access procedure. I turned my attention first to the antecubitum.  Under ultrasound guidance, I identified the location of the brachial artery and marked it on the skin.  I then looked on the upper arm near the axilla and marked the brachial vein as well as axillary vein. I made a longitudinal incision over the brachial artery above the antecubitum and another longitudinal incision over the brachial vein near the axillary vein.  I dissected down through the subcutaneous tissue and  fascia carefully and was able  to dissect out the brachial artery.  The artery was about 4.5 mm externally.  It was controlled proximally and distally with vessel loops.  I then dissected out the larger brachial vein through the upper arm incision.  Externally, it appeared to be 5 mm in diameter.  I then dissected this vein proximally and distal.  I took a metal Gore tunneler and dissected from the brachial artery to the axillary incision and tunneled over his prior occluded graft.  Then I delivered the 4 x 7-mm stretch Gore-Tex graft, through this metal tunneler and then pulled out the metal tunneler leaving the graft in place.  The 4-mm end was left on the brachial artery side and the 7-mm end toward the vein side.  I then gave the patient 5,000 units of heparin  to gain anticoagulation.  After waiting 2 minutes, I placed the brachial artery under tension proximally and distally with vessel loops, made an arteriotomy and extended it with a Potts scissor.  I sewed the 4-mm end of the graft to this arteriotomy with a running stitch of 6-0 Prolene with the help of my assistant.  At this point, then I completed the anastomosis in the usual fashion.  I released the vessel loops on the inflow and allowed the artery to decompress through the graft. There was good pulsatile bleeding through this graft.  I clamped the graft near its arterial anastomosis and sucked out all the blood in the graft and loaded the graft with heparinized saline.  At this point, I pulled the graft to appropriate length and reset my exposure of  the high brachial vein. The vein was controlled with Henle clamps and opened with 11 blade scalpel and extended with Potts scissors.  There was good venous backbleeding from the vein. I spatulated the graft to facilitate an end-to-side anastomosis.  In the process of spatulating, I cut the graft to appropriate length for this anastomosis.  This graft was sewn to the vein in an end-to-side configuration with a 6-0 Prolene with the help of  my assistant.  Prior to completing this anastomosis, I allowed the vein to back bleed and then I also allowed the artery to bleed in an antegrade fashion.  I completed this anastomosis in the usual fashion.  I irrigated out both incisions.  The graft had a good palpable thrill.  The radial and ulnar artery has good signals.  The subcutaneous tissue in each incision was reapproximated with a running stitch of 3-0 Vicryl.  The skin was then reapproximated with a running subcuticular 4-0 Monocryl.  The skin was then cleaned, dried, and Dermabond used to reinforce the skin closure.   COMPLICATIONS: None  CONDITION: Stable  [name], MD [facility] Office: [phone]  Lonni JINNY Gaskins    03/10/2024, 2:52 PM

## 2024-03-10 NOTE — Transfer of Care (Signed)
 Immediate Anesthesia Transfer of Care Note  Patient: Renelda LITTIE Remington  Procedure(s) Performed: INSERTION, GRAFT, ARTERIOVENOUS, UPPER EXTREMITY (Right)  Patient Location: PACU  Anesthesia Type:General  Level of Consciousness: awake, alert , oriented, and patient cooperative  Airway & Oxygen Therapy: Patient Spontanous Breathing  Post-op Assessment: Report given to RN and Post -op Vital signs reviewed and stable  Post vital signs: Reviewed and stable--Neo gtt available; MDA called to make aware of first PACU BP with Neo gtt off and patient mentating appropriately.   Last Vitals:  Vitals Value Taken Time  BP 117/54 03/10/24 14:48  Temp    Pulse 73 03/10/24 14:51  Resp 14 03/10/24 14:51  SpO2 93 % 03/10/24 14:51  Vitals shown include unfiled device data.  Last Pain:  Vitals:   03/10/24 1128  TempSrc:   PainSc: 0-No pain         Complications: No notable events documented.

## 2024-03-10 NOTE — Discharge Instructions (Signed)
   [facility]  Discharge Instructions  AV Fistula or Graft Surgery for Dialysis Access  Please refer to the following instructions for your post-procedure care. Your surgeon or physician assistant will discuss any changes with you.  Activity  You may drive the day following your surgery, if you are comfortable and no longer taking prescription pain medication. Resume full activity as the soreness in your incision resolves.  Bathing/Showering  You may shower after you go home. Keep your incision dry for 48 hours. Do not soak in a bathtub, hot tub, or swim until the incision heals completely. You may not shower if you have a hemodialysis catheter.  Incision Care  Clean your incision with mild soap and water  after 48 hours. Pat the area dry with a clean towel. You do not need a bandage unless otherwise instructed. Do not apply any ointments or creams to your incision. You may have skin glue on your incision. Do not peel it off. It will come off on its own in about one week. Your arm may swell a bit after surgery. To reduce swelling use pillows to elevate your arm so it is above your heart. Your doctor will tell you if you need to lightly wrap your arm with an ACE bandage.  Diet  Resume your normal diet. There are not special food restrictions following this procedure. In order to heal from your surgery, it is CRITICAL to get adequate nutrition. Your body requires vitamins, minerals, and protein. Vegetables are the best source of vitamins and minerals. Vegetables also provide the perfect balance of protein. Processed food has little nutritional value, so try to avoid this.  Medications  Resume taking all of your medications. If your incision is causing pain, you may take over-the counter pain relievers such as acetaminophen  (Tylenol ). If you were prescribed a stronger pain medication, please be aware these medications can cause nausea and constipation. Prevent  nausea by taking the medication with a snack or meal. Avoid constipation by drinking plenty of fluids and eating foods with high amount of fiber, such as fruits, vegetables, and grains.  Do not take Tylenol  if you are taking prescription pain medications.  Follow up Your surgeon may want to see you in the office following your access surgery. If so, this will be arranged at the time of your surgery.  Please call us  immediately for any of the following conditions:  Increased pain, redness, drainage (pus) from your incision site Fever of 101 degrees or higher Severe or worsening pain at your incision site Hand pain or numbness.  Reduce your risk of vascular disease:  Stop smoking. If you would like help, call [facility] at 1-800-QUIT-NOW (513-791-9296) or [place] at [phone]  Manage your cholesterol Maintain a desired weight Control your diabetes Keep your blood pressure down  Dialysis  It will take several weeks to several months for your new dialysis access to be ready for use. Your surgeon will determine when it is okay to use it. Your nephrologist will continue to direct your dialysis. You can continue to use your Permcath until your new access is ready for use.   03/10/2024 [name] [id number] May 26, 1933  Surgeon(s): Robert Lonni PARAS, MD  Procedure(s): INSERTION, GRAFT, ARTERIOVENOUS, UPPER EXTREMITY  x Do not stick fistula for 4 weeks    If you have any questions, please call the office at [phone].

## 2024-03-10 NOTE — Anesthesia Procedure Notes (Signed)
 Procedure Name: LMA Insertion Date/Time: 03/10/2024 1:21 PM  Performed by: Cindie Donald CROME, CRNAPre-anesthesia Checklist: Patient identified, Emergency Drugs available, Suction available and Patient being monitored Patient Re-evaluated:Patient Re-evaluated prior to induction Oxygen Delivery Method: Circle System Utilized Preoxygenation: Pre-oxygenation with 100% oxygen Induction Type: IV induction Ventilation: Mask ventilation without difficulty LMA: LMA inserted LMA Size: 5.0 Number of attempts: 1 Placement Confirmation: positive ETCO2 Tube secured with: Tape Dental Injury: Teeth and Oropharynx as per pre-operative assessment

## 2024-03-10 NOTE — Anesthesia Postprocedure Evaluation (Signed)
 Anesthesia Post Note  Patient: [name]  Procedure(s) Performed: INSERTION, GRAFT, ARTERIOVENOUS, UPPER EXTREMITY (Right)     Patient location during evaluation: PACU Anesthesia Type: General Level of consciousness: awake and alert Pain management: pain level controlled Vital Signs Assessment: post-procedure vital signs reviewed and stable Respiratory status: spontaneous breathing, nonlabored ventilation, respiratory function stable and patient connected to nasal cannula oxygen Cardiovascular status: blood pressure returned to baseline and stable Postop Assessment: no apparent nausea or vomiting Anesthetic complications: no   No notable events documented.  Last Vitals:  Vitals:   03/10/24 1615 03/10/24 1626  BP: (!) 102/55 (!) 107/57  Pulse: 83 80  Resp: 14   Temp:  36.5 C  SpO2: 99% 99%    Last Pain:  Vitals:   03/10/24 1515  TempSrc:   PainSc: 0-No pain                 [name]

## 2024-03-11 ENCOUNTER — Encounter (HOSPITAL_COMMUNITY): Payer: Self-pay | Admitting: Vascular Surgery

## 2024-03-15 ENCOUNTER — Encounter: Payer: Self-pay | Admitting: Cardiovascular Disease

## 2024-03-15 ENCOUNTER — Ambulatory Visit: Attending: Cardiovascular Disease | Admitting: Cardiovascular Disease

## 2024-03-15 VITALS — BP 130/50 | HR 71 | Ht 72.0 in | Wt 208.0 lb

## 2024-03-15 DIAGNOSIS — I10 Essential (primary) hypertension: Secondary | ICD-10-CM | POA: Diagnosis not present

## 2024-03-15 DIAGNOSIS — Z9861 Coronary angioplasty status: Secondary | ICD-10-CM | POA: Diagnosis not present

## 2024-03-15 DIAGNOSIS — E785 Hyperlipidemia, unspecified: Secondary | ICD-10-CM | POA: Diagnosis not present

## 2024-03-15 DIAGNOSIS — I2510 Atherosclerotic heart disease of native coronary artery without angina pectoris: Secondary | ICD-10-CM | POA: Diagnosis not present

## 2024-03-15 DIAGNOSIS — I359 Nonrheumatic aortic valve disorder, unspecified: Secondary | ICD-10-CM | POA: Diagnosis not present

## 2024-03-15 NOTE — Assessment & Plan Note (Signed)
 History of aortic stenosis with 2D echo performed 02/09/2023 that showed moderate aortic stenosis with a valve area of 1.09 cm with a peak gradient of 43 mmHg.  His most recent echo performed 02/25/2024 showed a valve area of 0.61 cm with a peak gradient of 72 mmHg.  He has a very soft outflow tract murmur.  He is completely asymptomatic.  I am going to recheck an echo in 6 months.  I suspect one of the previous 2 echoes were wrong.

## 2024-03-15 NOTE — Patient Instructions (Addendum)
 Medication Instructions:  Your physician recommends that you continue on your current medications as directed. Please refer to the Current Medication list given to you today.  *If you need a refill on your cardiac medications before your next appointment, please call your pharmacy*   Testing/Procedures: Your physician has requested that you have an echocardiogram. Echocardiography is a painless test that uses sound waves to create images of your heart. It provides your doctor with information about the size and shape of your heart and how well your heart's chambers and valves are working. This procedure takes approximately one hour. There are no restrictions for this procedure. Please do NOT wear cologne, perfume, aftershave, or lotions (deodorant is allowed). Please arrive 15 minutes prior to your appointment time.  Please note: We ask at that you not bring children with you during ultrasound (echo/ vascular) testing. Due to room size and safety concerns, children are not allowed in the ultrasound rooms during exams. Our front office staff cannot provide observation of children in our lobby area while testing is being conducted. An adult accompanying a patient to their appointment will only be allowed in the ultrasound room at the discretion of the ultrasound technician under special circumstances. We apologize for any inconvenience.  **To do in May**   Follow-Up: At [facility], you and your health needs are our priority.  As part of our continuing mission to provide you with exceptional heart care, our providers are all part of one team.  This team includes your primary Cardiologist (physician) and Advanced Practice Providers or APPs (Physician Assistants and Nurse Practitioners) who all work together to provide you with the care you need, when you need it.  Your next appointment:   6 month(s)  Provider:   Dorn Lesches, MD

## 2024-03-15 NOTE — Progress Notes (Signed)
 03/15/2024 SHAHIN KNIERIM   1934-03-03  [id number]  Primary Physician Teresa Thersia Jansky, MD Primary Cardiologist: Dorn JINNY Lesches MD FACP, [place], [place], FSCAI  HPI:  [name] is a 89 y.o.   moderately overweight, recently remarried for the third time (09/30/14) African American male, father of 4 children and 4 stepchildren, grandfather to 20 grandchildren, who is formerly a patient of Dr. Donnia.  I last saw him in the office 11/06/2022.  He is accompanied by his wife [name] today.  He has a history of CAD status post LAD stenting with a Promus drug-eluting stent, October of 2009. He had attempt at VT ablation by Dr. Lynwood Rakers, February 2010; however, this was aborted because of inappropriate substrate. His other problems include hypertension, hyperlipidemia, diabetes, as well as history of prostate cancer.  His last Myoview performed 04/11/11 was completely normal.    He began dialysis in January of this year (Tuesday/Thursday/Saturday).  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  Dr. Keenan revised his AV fistula.   Since I saw him 3 months ago he remained stable.  He did have a 2D echo performed 02/25/2024 that showed severe aortic stenosis with a valve area of 0.61 cm and a peak gradient of 72 mmHg with preserved LV function.  This is in contrast to the echo done 02/09/2023 which revealed moderate aortic stenosis with a valve area of 1.06 cm and a peak gradient of 43 mmHg.  He specifically denies chest pain or shortness of breath.  Current Meds  Medication Sig   acetaminophen  (TYLENOL ) 500 MG tablet Take 1,000 mg by mouth daily as needed for mild pain (pain score 1-3) or moderate pain (pain score 4-6).   amLODipine  (NORVASC ) 10 MG tablet TAKE 1 TABLET EVERY DAY (APPOINTMENT IS NEEDED FOR REFILLS)   atorvastatin  (LIPITOR) 10 MG tablet Take 1 tablet (10 mg total) by mouth daily.   B Complex-C-Folic Acid (DIALYVITE 800) 0.8 MG TABS Take 1  tablet by mouth daily.   Blood Glucose Monitoring Suppl (ACCU-CHEK AVIVA PLUS) w/Device KIT Test blood sugar 3 times daily. Dx code: E11.22   Blood Pressure Monitoring (BLOOD PRESS MONITOR/M-L CUFF) MISC 1 application by Does not apply route daily.   calcitRIOL  (ROCALTROL ) 0.25 MCG capsule Take 0.25 mcg by mouth Every Tuesday,Thursday,and Saturday with dialysis.   carvedilol  (COREG ) 25 MG tablet TAKE 1 TABLET TWICE DAILY. KEEP OFFICE VISIT   Cholecalciferol  (VITAMIN D -3 PO) Take 1 tablet by mouth daily.   Continuous Blood Gluc Sensor (FREESTYLE LIBRE SENSOR SYSTEM) MISC 1 application by Does not apply route daily.   Continuous Glucose Monitor Sup KIT 1 kit by Does not apply route as directed.   glucose blood (ACCU-CHEK AVIVA PLUS) test strip Test blood sugar 3 times daily. Dx code: E11.22   insulin  NPH-regular Human (NOVOLIN  70/30) (70-30) 100 UNIT/ML injection Inject 8 Units into the skin 2 (two) times daily with a meal.   isosorbide  mononitrate (IMDUR ) 60 MG 24 hr tablet TAKE 2 TABLETS EVERY DAY   Lancets (ACCU-CHEK MULTICLIX) lancets Test blood sugar 3 times daily. Dx code: E11.22   oxyCODONE -acetaminophen  (PERCOCET) 5-325 MG tablet Take 1 tablet by mouth every 6 (six) hours as needed.   Polyethyl Glycol-Propyl Glycol (SYSTANE) 0.4-0.3 % SOLN Place 1 drop into both eyes as needed (dry eyes).   sevelamer carbonate (RENVELA) 800 MG tablet Take 800 mg by mouth 3 (three) times daily with meals.   tamsulosin  (  FLOMAX ) 0.4 MG CAPS capsule Take 0.4 mg by mouth daily.   torsemide  (DEMADEX ) 20 MG tablet Take 2 tablets (40 mg total) by mouth 2 (two) times daily. In case of weight gain 2 to 3 lbs in 24 hrs or 5 lbs in 7 days, take one extra tablet a day in the morning, until weight back to baseline. (Patient taking differently: Take 20 mg by mouth daily.)     No Known Allergies  Social History   Socioeconomic History   Marital status: Married    Spouse name: Not on file   Number of children: 7    Years of education: Not on file   Highest education level: Not on file  Occupational History   Occupation: RETIRED    Employer: RETIRED  Tobacco Use   Smoking status: Former    Current packs/day: 0.00    Types: Cigarettes    Quit date: 05/10/2002    Years since quitting: 21.8   Smokeless tobacco: Never  Vaping Use   Vaping status: Never Used  Substance and Sexual Activity   Alcohol  use: No   Drug use: Not Currently    Types: Marijuana    Comment: Last use marijuana was on 01/20/23.  Informed to withhold 1-2 days prior to procedure.   Sexual activity: Not Currently  Other Topics Concern   Not on file  Social History Narrative   Not on file   Social Drivers of Health   Financial Resource Strain: Not on file  Food Insecurity: No Food Insecurity (12/09/2023)   Hunger Vital Sign    Worried About Running Out of Food in the Last Year: Never true    Ran Out of Food in the Last Year: Never true  Transportation Needs: No Transportation Needs (12/09/2023)   PRAPARE - Administrator, Civil Service (Medical): No    Lack of Transportation (Non-Medical): No  Physical Activity: Not on file  Stress: Not on file  Social Connections: Patient Declined (12/09/2023)   Social Connection and Isolation Panel    Frequency of Communication with Friends and Family: Patient declined    Frequency of Social Gatherings with Friends and Family: Patient declined    Attends Religious Services: Patient declined    Database Administrator or Organizations: Patient declined    Attends Banker Meetings: Patient declined    Marital Status: Patient declined  Intimate Partner Violence: Not At Risk (12/09/2023)   Humiliation, Afraid, Rape, and Kick questionnaire    Fear of Current or Ex-Partner: No    Emotionally Abused: No    Physically Abused: No    Sexually Abused: No     Review of Systems: General: negative for chills, fever, night sweats or weight changes.  Cardiovascular: negative for  chest pain, dyspnea on exertion, edema, orthopnea, palpitations, paroxysmal nocturnal dyspnea or shortness of breath Dermatological: negative for rash Respiratory: negative for cough or wheezing Urologic: negative for hematuria Abdominal: negative for nausea, vomiting, diarrhea, bright red blood per rectum, melena, or hematemesis Neurologic: negative for visual changes, syncope, or dizziness All other systems reviewed and are otherwise negative except as noted above.    Blood pressure (!) 130/50, pulse 71, height 6' (1.829 m), weight 208 lb (94.3 kg), SpO2 97%.  General appearance: alert and no distress Neck: no adenopathy, no carotid bruit, no JVD, supple, symmetrical, trachea midline, and thyroid not enlarged, symmetric, no tenderness/mass/nodules Lungs: clear to auscultation bilaterally Heart: Soft outflow tract murmur Extremities: extremities normal, atraumatic, no cyanosis  or edema Pulses: 2+ and symmetric Skin: Skin color, texture, turgor normal. No rashes or lesions Neurologic: Grossly normal  EKG not performed today      ASSESSMENT AND PLAN:   Aortic valve disease History of aortic stenosis with 2D echo performed 02/09/2023 that showed moderate aortic stenosis with a valve area of 1.09 cm with a peak gradient of 43 mmHg.  His most recent echo performed 02/25/2024 showed a valve area of 0.61 cm with a peak gradient of 72 mmHg.  He has a very soft outflow tract murmur.  He is completely asymptomatic.  I am going to recheck an echo in 6 months.  I suspect one of the previous 2 echoes were wrong.     Dorn DOROTHA Lesches MD FACP,FACC,FAHA, [facility] 03/15/2024 10:21 AM

## 2024-03-23 ENCOUNTER — Telehealth: Payer: Self-pay | Admitting: Cardiovascular Disease

## 2024-03-23 NOTE — Telephone Encounter (Signed)
 Pt c/o Shortness Of Breath: STAT if SOB developed within the last 24 hours or pt is noticeably SOB on the phone  1. Are you currently SOB (can you hear that pt is SOB on the phone)? No  2. How long have you been experiencing SOB? Two days   3. Are you SOB when sitting or when up moving around? Moving around  4. Are you currently experiencing any other symptoms? Just a cough

## 2024-03-23 NOTE — Telephone Encounter (Signed)
 Spoke with patient's wife, [name] (OK per DPR). [name] states patient asked her to call and let Dr. Court know that he has been experiencing some shortness of breath when walking short distances. [name] states this has been going on for the past 2 days, but possibly longer.  Patient has a cough that is improving. He saw PCP yesterday about cough and had CXR performed and labs drawn. Flu and COVID test are negative.  Findings from CXR yesterday: FINDINGS:  Cardiovascular: Cardiac silhouette and pulmonary vasculature are within normal limits. Right IJ approach dialysis catheter tip projects over the superior cavoatrial junction. Mediastinum: Within normal limits. Lungs/pleura: Interstitial and hazy opacities in the lung bases. Small left pleural effusion versus pleural thickening. Upper abdomen: Visualized portions are unremarkable. Chest wall/osseous structures: No acute osseous abnormality. Multilevel degenerative changes of the thoracic spine   [name] reports patient is currently taking torsemide  20 mg BID. Their daughter fixes his med planner for him.  Will forward to Dr. Court to review for further advisement.

## 2024-03-23 NOTE — Telephone Encounter (Signed)
 Spoke to pt, aware of MD advisement. Aware office will call  to arrange an APP OV early next year, as there is no availability this year before the holiday. Please add pt to wait list if appt becomes available sooner than next year. Advised to call the office if symptom worsens before his OV. Patient verbalized understanding and agreeable to plan.

## 2024-04-01 ENCOUNTER — Other Ambulatory Visit: Payer: Self-pay | Admitting: Cardiovascular Disease

## 2024-04-12 ENCOUNTER — Encounter: Payer: Self-pay | Admitting: Cardiovascular Disease

## 2024-04-12 ENCOUNTER — Ambulatory Visit: Attending: Cardiovascular Disease | Admitting: Cardiovascular Disease

## 2024-04-12 VITALS — BP 128/54 | HR 81 | Ht 72.0 in | Wt 206.0 lb

## 2024-04-12 DIAGNOSIS — I359 Nonrheumatic aortic valve disorder, unspecified: Secondary | ICD-10-CM | POA: Diagnosis not present

## 2024-04-12 NOTE — Patient Instructions (Signed)
 Medication Instructions:  Your physician recommends that you continue on your current medications as directed. Please refer to the Current Medication list given to you today.  *If you need a refill on your cardiac medications before your next appointment, please call your pharmacy*   Follow-Up: At [facility], you and your health needs are our priority.  As part of our continuing mission to provide you with exceptional heart care, our providers are all part of one team.  This team includes your primary Cardiologist (physician) and Advanced Practice Providers or APPs (Physician Assistants and Nurse Practitioners) who all work together to provide you with the care you need, when you need it.  Your next appointment:   6 month(s)  Provider:   Dorn Lesches, MD

## 2024-04-12 NOTE — Progress Notes (Signed)
 [name] returns today to discuss the 2D echo and his symptoms.  He is accompanied by his wife Orie today.  He did have a significant worsening of his aortic valve area from 1.06 cm a year ago to 0.61 cm with a peak gradient of 72 mmHg and preserved LV function.  He does complain of some dyspnea on exertion but denies chest pain.  I am referring him to Dr. Ozell Fell in the structural heart clinic to follow along with me to determine the optimal time of potential TAVR if he is a candidate.  We will recheck an echo in 6 months after which I will see him back in follow-up.  Dorn DOROTHA Lesches, M.D., FACP, [facility], FAHA, [facility]  [address] [place], [place]  [address]  [phone] 04/12/2024 9:30 AM

## 2024-04-30 ENCOUNTER — Ambulatory Visit: Attending: Cardiovascular Disease | Admitting: Cardiovascular Disease

## 2024-04-30 ENCOUNTER — Encounter: Payer: Self-pay | Admitting: Cardiovascular Disease

## 2024-04-30 VITALS — BP 116/50 | HR 70 | Ht 72.0 in | Wt 205.7 lb

## 2024-04-30 DIAGNOSIS — I350 Nonrheumatic aortic (valve) stenosis: Secondary | ICD-10-CM

## 2024-04-30 DIAGNOSIS — Z01812 Encounter for preprocedural laboratory examination: Secondary | ICD-10-CM

## 2024-04-30 LAB — CBC
Hematocrit: 30.7 % — ABNORMAL LOW (ref 37.5–51.0)
Hemoglobin: 9.4 g/dL — ABNORMAL LOW (ref 13.0–17.7)
MCH: 27.7 pg (ref 26.6–33.0)
MCHC: 30.6 g/dL — ABNORMAL LOW (ref 31.5–35.7)
MCV: 91 fL (ref 79–97)
Platelets: 197 x10E3/uL (ref 150–450)
RBC: 3.39 x10E6/uL — ABNORMAL LOW (ref 4.14–5.80)
RDW: 15 % (ref 11.6–15.4)
WBC: 7.8 x10E3/uL (ref 3.4–10.8)

## 2024-04-30 LAB — BASIC METABOLIC PANEL WITH GFR
BUN/Creatinine Ratio: 3 — ABNORMAL LOW (ref 10–24)
BUN: 23 mg/dL (ref 8–27)
CO2: 26 mmol/L (ref 20–29)
Calcium: 9 mg/dL (ref 8.6–10.2)
Chloride: 96 mmol/L (ref 96–106)
Creatinine, Ser: 6.68 mg/dL — ABNORMAL HIGH (ref 0.76–1.27)
Glucose: 87 mg/dL (ref 70–99)
Potassium: 4.7 mmol/L (ref 3.5–5.2)
Sodium: 139 mmol/L (ref 134–144)
eGFR: 7 mL/min/1.73 — ABNORMAL LOW

## 2024-04-30 NOTE — H&P (View-Only) (Signed)
 " Cardiology Office Note:    Date:  04/30/2024   ID:  [name], DOB 11/30/1933, MRN [id number]  PCP:  Teresa Thersia Jansky, MD   [facility] Providers Cardiologist:  Dorn Lesches, MD Electrophysiologist:  Lynwood Rakers, MD (Inactive)     Referring MD: Teresa Thersia Jansky, MD   Chief Complaint  Patient presents with   Shortness of Breath    History of Present Illness:    [name] is a 89 y.o. male referred by Dr Lesches for evaluation of aortic stenosis.  The patient has a past history of coronary artery disease status post LAD stenting in 2009.  Other comorbidities include advanced age, hypertension, diabetes, and prostate cancer.  He has developed end-stage renal disease and is dialyzed via a left arm AV fistula.  Over the past year the patient has developed progressive aortic valve stenosis, now in the severe range.  Recent echocardiogram shows an LVEF of 70 to 75% with grade 2 diastolic dysfunction, normal RV function, no significant mitral valve disease, and severe aortic stenosis with a mean gradient of 43 mmHg, V-max of 4.3 m/s, dimensionless index of 0.19, and calculated aortic valve area of 0.6 cm.  The patient has had no recent cardiac catheterization or CTA studies performed.  The patient is here with his wife today.  He spent much of his life in [name] .  His wife is originally from [name], [place] .  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  He still enjoys playing golf, but recently has been unable to play due to dyspnea and fatigue.  The patient remains remarkably active and appears quite vibrant.  He has been on dialysis now since last year.  Reports that he tolerates dialysis fairly well.  He is compliant with diet and medications.  Over the last several months he has developed worsening shortness of breath with exercise and exercise intolerance.  He has no chest pain or pressure.  He denies edema, orthopnea, or PND.  He had  a failed left arm AV fistula.  He has a tunneled right-sided dialysis catheter.  He has had a right arm AV fistula that has been used over his last few dialysis sessions without problems.  He understands the need to have his tunneled catheter removed before any heart valve intervention.  The patient has to catheterize his bladder about twice daily as he is still making urine and has problems with voiding dysfunction.  Dental hx: Edentulous with full dentures  Past Medical History:  Diagnosis Date   Allergy     Anemia    CAD (coronary artery disease)    pci to LAD 10/09; stable CAD by cath 05/31/08; myoview 04/11/11- no ishcemia, EF 67%; echo 05/15/09- EF>55%, mod calcification of the aortic valve leaflets   CAD S/P percutaneous coronary angioplasty 05/08/2008   LAD PCI with DES 2009- Myoview low risk 2013   CHF (congestive heart failure) (HCC) 09/07/2019   Chronic back pain    Chronic kidney disease    Cystitis, radiation 12/01/2013   Dyspnea    ED (erectile dysfunction)    Elevated serum creatinine 12/14/2015   Essential hypertension, benign    Hematuria 12/01/2013   History of blood transfusion    Hyperlipidemia    Ileus (HCC) 12/14/2015   Insulin  dependent type 2 diabetes mellitus, controlled (HCC)    Malignant neoplasm of prostate (HCC) 11/26/2013   Multiple rib fractures    left 9th, 10th, and 11th posterior  rib fractures S/P fall 12/09/2015/notes 12/12/2015   Osteoarthritis of right knee 03/04/2018   Personal history of COVID-19 09/05/2021   PREMATURE VENTRICULAR CONTRACTIONS 05/08/2008   Qualifier: Diagnosis of  By: Kelsie, MD, James     Prostate cancer [name])    s/p   Rectal bleeding 12/23/2012   Evaluated by Dr Rollin GI patient with history of radiation proctitis. Patient due for colonoscopy on 04/2014    Type II or unspecified type diabetes mellitus without mention of complication, not stated as uncontrolled    Ventricular tachycardia (HCC)    resuscitated; monitor 05/2008;  attempted T ablation 2/10- aborted due to inappropriate substrate   Past Surgical History:  Procedure Laterality Date   A/V SHUNT INTERVENTION [name] 08/08/2023   Procedure: A/V SHUNT INTERVENTION;  Surgeon: Tobie Gordy POUR, MD;  Location: [facility];  Service: Cardiovascular;  Laterality: [name];   A/V SHUNT INTERVENTION [name] 09/03/2023   Procedure: A/V SHUNT INTERVENTION;  Surgeon: Lanis Fonda BRAVO, MD;  Location: [facility];  Service: Cardiovascular;  Laterality: [name];   AV FISTULA PLACEMENT Left 12/02/2022   Procedure: LEFT ARM BRACHIOCEPHALIC ARTERIOVENOUS (AV) FISTULA CREATION;  Surgeon: Gretta Lonni PARAS, MD;  Location: [facility] OR;  Service: Vascular;  Laterality: Left;   AV FISTULA PLACEMENT Left 01/22/2023   Procedure: LEFT ARM BRACHIOBASILISC ARTERIOVENOUS (AV) FISTULA CREATION;  Surgeon: Gretta Lonni PARAS, MD;  Location: [facility] OR;  Service: Vascular;  Laterality: Left;   BASCILIC VEIN TRANSPOSITION Left 05/02/2023   Procedure: Insertion of LEFT Arm Arterio-venous Gortex Graft;  Surgeon: Gretta Lonni PARAS, MD;  Location: [facility] OR;  Service: Vascular;  Laterality: Left;   CARDIAC CATHETERIZATION  05/31/08   EF 55-60%, stable lesions, medical therapy   COLECTOMY  '80's   polyps   CORONARY ANGIOPLASTY WITH STENT PLACEMENT  01/19/08   PCI stent to mid LAD with Promus DES 27.5x28   CYSTOSCOPY WITH DIRECT VISION INTERNAL URETHROTOMY [name] 03/12/2021   Procedure: cystoscoopy withdirect vision internal urethrotomy optilum urethral dilation     ;  Surgeon: Carolee Sherwood JONETTA DOUGLAS, MD;  Location: [facility] OR[facility];  Service: Urology;  Laterality: [name];  RERQUESTING 45 MINS   DIALYSIS/PERMA CATHETER INSERTION [name] 04/10/2023   Procedure: DIALYSIS/PERMA CATHETER INSERTION;  Surgeon: Tobie Gordy POUR, MD;  Location: [facility];  Service: Cardiovascular;  Laterality: [name];   DIALYSIS/PERMA CATHETER INSERTION [name] 09/05/2023   Procedure: DIALYSIS/PERMA CATHETER INSERTION;  Surgeon: Serene Gaile ORN, MD;  Location: [facility];   Service: Cardiovascular;  Laterality: [name];   INSERTION OF ARTERIOVENOUS (AV) ARTEGRAFT ARM Right 09/24/2023   Procedure: INSERTION, GRAFT, ARTERIOVENOUS, UPPER EXTREMITY;  Surgeon: Magda Debby SAILOR, MD;  Location: [facility];  Service: Vascular;  Laterality: Right;   INSERTION OF ARTERIOVENOUS (AV) ARTEGRAFT ARM Right 03/10/2024   Procedure: INSERTION, GRAFT, ARTERIOVENOUS, UPPER EXTREMITY;  Surgeon: Gretta Lonni PARAS, MD;  Location: [facility];  Service: Vascular;  Laterality: Right;   IR GENERIC HISTORICAL  01/03/2016   IR THORACENTESIS ASP PLEURAL SPACE W/IMG GUIDE 01/03/2016 [facility]   IR REMOVAL TUN CV CATH W/O FL  11/07/2023   IR TUNNELED CENTRAL VENOUS CATH PLC W IMG  12/09/2023   KNEE ARTHROPLASTY Right 03/04/2018   Procedure: COMPUTER ASSISTED TOTAL KNEE ARTHROPLASTY;  Surgeon: Fidel Rogue, MD;  Location: [facility];  Service: Orthopedics;  Laterality: Right;   PERIPHERAL VASCULAR THROMBECTOMY  09/03/2023   Procedure: PERIPHERAL VASCULAR THROMBECTOMY;  Surgeon: Lanis Fonda BRAVO, MD;  Location: [facility];  Service: Cardiovascular;;  avf  PTCA  01/2008   TRANSURETHRAL RESECTION OF BLADDER TUMOR [name] 11/10/2020   Procedure: PHYLLIS BOYCE CHARLESETTA GEROLD;  Surgeon: Sherrilee Belvie CROME, MD;  Location: [facility];  Service: Urology;  Laterality: [name];   TRANSURETHRAL RESECTION OF BLADDER TUMOR [name] 08/12/2022   Procedure: TRANSURETHRAL RESECTION OF BLADDER TUMOR (TURBT);  Surgeon: Carolee Sherwood JONETTA DOUGLAS, MD;  Location: [facility];  Service: Urology;  Laterality: [name];   TRANSURETHRAL RESECTION OF BLADDER TUMOR WITH MITOMYCIN -C [name] 09/17/2021   Procedure: TRANSURETHRAL RESECTION OF BLADDER TUMOR WITH GEMCITABINE ;  Surgeon: Carolee Sherwood JONETTA DOUGLAS, MD;  Location: [facility];  Service: Urology;  Laterality: [name];   UPPER EXTREMITY VENOGRAPHY Bilateral 02/12/2024   Procedure: UPPER EXTREMITY VENOGRAPHY;  Surgeon: Gretta Lonni PARAS, MD;  [facility];  Service: Cardiovascular;  Laterality: Bilateral;    VENOUS STENT  09/03/2023   Procedure: VENOUS STENT;  Surgeon: Lanis Fonda BRAVO, MD;  Location: [facility];  Service: Cardiovascular;;  avf    Current Medications: Active Medications[1]   Allergies:   Patient has no known allergies.   ROS:   Please see the history of present illness.    All other systems reviewed and are negative.  EKGs/Labs/Other Studies Reviewed:    The following studies were reviewed today: Cardiac Studies & Procedures   ______________________________________________________________________________________________   STRESS TESTS  NM MYOCAR MULTI W/SPECT W 04/11/2011   ECHOCARDIOGRAM  ECHOCARDIOGRAM COMPLETE 02/25/2024  Narrative ECHOCARDIOGRAM REPORT    Patient Name:   BREVEN GUIDROZ Date of Exam: 02/25/2024 Medical Rec #:  [id number]       Height:       72.0 in Accession #:    [id number]      Weight:       202.0 lb Date of Birth:  14-Nov-1933      BSA:          2.140 m Patient Age:    89 years        BP:           127/70 mmHg Patient Gender: M               HR:           80 bpm. Exam Location:  [address]  Procedure: 2D Echo and Strain Analysis (Both Spectral and Color Flow Doppler were utilized during procedure).  Indications:    I35.9* Aortic valve disease, unspecified  History:        Patient has prior history of Echocardiogram examinations, most recent 02/09/2023. CHF, CAD, Arrythmias:PVC; Risk Factors:Hypertension, Diabetes, Dyslipidemia and Former Smoker. Aortic stenosis. S/P percutaneous coronary angioplasty. ESRD. History of ventricular tachycardia. Lower extremity edema.  Sonographer:    Jon Hacker RCS Referring Phys: [phone] JONATHAN J BERRY  IMPRESSIONS   1. Left ventricular ejection fraction, by estimation, is 70 to 75%. The left ventricle has hyperdynamic function. The left ventricle has no regional wall motion abnormalities. Left ventricular diastolic parameters are consistent with Grade II diastolic dysfunction  (pseudonormalization). Elevated left atrial pressure. The average left ventricular global longitudinal strain is -23.3 %. The global longitudinal strain is normal. 2. Right ventricular systolic function is normal. The right ventricular size is normal. 3. Left atrial size was moderately dilated. 4. The mitral valve is normal in structure. No evidence of mitral valve regurgitation. No evidence of mitral stenosis. 5. The aortic valve is normal in structure. There is moderate calcification of the aortic valve. There is moderate thickening of the aortic valve. Aortic valve regurgitation is moderate.  Severe aortic valve stenosis. Aortic valve area, by VTI measures 0.61 cm. Aortic valve mean gradient measures 43.0 mmHg. Aortic valve Vmax measures 4.26 m/s. 6. The inferior vena cava is normal in size with greater than 50% respiratory variability, suggesting right atrial pressure of 3 mmHg.  Comparison(s): Changes from prior study are noted. Worsening AV disease.  FINDINGS Left Ventricle: Hyperdynamic LV with mild gradient, increased to with valsalva. Left ventricular ejection fraction, by estimation, is 70 to 75%. The left ventricle has hyperdynamic function. The left ventricle has no regional wall motion abnormalities. The average left ventricular global longitudinal strain is -23.3 %. Strain was performed and the global longitudinal strain is normal. The left ventricular internal cavity size was normal in size. There is no left ventricular hypertrophy. Left ventricular diastolic parameters are consistent with Grade II diastolic dysfunction (pseudonormalization). Elevated left atrial pressure.  Right Ventricle: The right ventricular size is normal. No increase in right ventricular wall thickness. Right ventricular systolic function is normal.  Left Atrium: Left atrial size was moderately dilated.  Right Atrium: Right atrial size was normal in size.  Pericardium: There is no evidence of  pericardial effusion.  Mitral Valve: The mitral valve is normal in structure. Mild mitral annular calcification. No evidence of mitral valve regurgitation. No evidence of mitral valve stenosis.  Tricuspid Valve: The tricuspid valve is normal in structure. Tricuspid valve regurgitation is mild . No evidence of tricuspid stenosis.  Aortic Valve: The aortic valve is normal in structure. There is moderate calcification of the aortic valve. There is moderate thickening of the aortic valve. Aortic valve regurgitation is moderate. Severe aortic stenosis is present. Aortic valve mean gradient measures 43.0 mmHg. Aortic valve peak gradient measures 72.6 mmHg. Aortic valve area, by VTI measures 0.61 cm.  Pulmonic Valve: The pulmonic valve was normal in structure. Pulmonic valve regurgitation is not visualized. No evidence of pulmonic stenosis.  Aorta: The aortic root is normal in size and structure.  Venous: The inferior vena cava is normal in size with greater than 50% respiratory variability, suggesting right atrial pressure of 3 mmHg.  IAS/Shunts: No atrial level shunt detected by color flow Doppler.   LEFT VENTRICLE PLAX 2D LVIDd:         4.28 cm   Diastology LVIDs:         2.83 cm   LV e' medial:    5.08 cm/s LV PW:         1.23 cm   LV E/e' medial:  26.6 LV IVS:        1.21 cm   LV e' lateral:   4.03 cm/s LVOT diam:     2.00 cm   LV E/e' lateral: 33.5 LV SV:         60 LV SV Index:   28        2D Longitudinal Strain LVOT Area:     3.14 cm  2D Strain GLS (A4C):   -21.2 % 2D Strain GLS (A3C):   -26.9 % 2D Strain GLS (A2C):   -21.6 % 2D Strain GLS Avg:     -23.3 %  RIGHT VENTRICLE RV Basal diam:  3.97 cm RV S prime:     14.00 cm/s TAPSE (M-mode): 2.5 cm  LEFT ATRIUM             Index        RIGHT ATRIUM           Index LA diam:  4.20 cm 1.96 cm/m   RA Area:     21.50 cm LA Vol (A2C):   74.1 ml 34.63 ml/m  RA Volume:   50.70 ml  23.70 ml/m LA Vol (A4C):   85.7 ml 40.05  ml/m LA Biplane Vol: 87.4 ml 40.85 ml/m AORTIC VALVE AV Area (Vmax):    0.74 cm AV Area (Vmean):   0.70 cm AV Area (VTI):     0.61 cm AV Vmax:           426.00 cm/s AV Vmean:          311.000 cm/s AV VTI:            0.991 m AV Peak Grad:      72.6 mmHg AV Mean Grad:      43.0 mmHg LVOT Vmax:         101.00 cm/s LVOT Vmean:        69.500 cm/s LVOT VTI:          0.192 m LVOT/AV VTI ratio: 0.19  AORTA Ao Root diam: 3.80 cm Ao Asc diam:  3.70 cm  MITRAL VALVE MV Area (PHT): 2.17 cm     SHUNTS MV Decel Time: 349 msec     Systemic VTI:  0.19 m MV E velocity: 135.00 cm/s  Systemic Diam: 2.00 cm MV A velocity: 161.00 cm/s MV E/A ratio:  0.84  Morene Brownie Electronically signed by Morene Brownie Signature Date/Time: 02/25/2024/3:14:49 PM    Final          ______________________________________________________________________________________________      EKG:        Recent Labs: 12/10/2023: Platelets 219 03/10/2024: BUN 35; Creatinine, Ser 8.60; Hemoglobin 11.6; Potassium 4.6; Sodium 134  Recent Lipid Panel    Component Value Date/Time   CHOL 125 02/07/2021 0920   TRIG 111 02/07/2021 0920   HDL 38 (L) 02/07/2021 0920   CHOLHDL 3.3 02/07/2021 0920   CHOLHDL 2.8 08/30/2015 0805   VLDL 20 08/30/2015 0805   LDLCALC 67 02/07/2021 0920     Risk Assessment/Calculations:                Physical Exam:    VS:  BP (!) 116/50 (BP Location: Left Arm, Patient Position: Sitting, Cuff Size: Normal)   Pulse 70   Ht 6' (1.829 m)   Wt 205 lb 11.2 oz (93.3 kg)   SpO2 95%   BMI 27.90 kg/m     Wt Readings from Last 3 Encounters:  04/30/24 205 lb 11.2 oz (93.3 kg)  04/12/24 206 lb (93.4 kg)  03/15/24 208 lb (94.3 kg)     GEN:  Well nourished, well developed elderly male in no acute distress HEENT: Normal NECK: No JVD; No carotid bruits LYMPHATICS: No lymphadenopathy CARDIAC: RRR, 2/6 harsh crescendo decrescendo murmur at the right upper sternal border  with absent A2 RESPIRATORY:  Robert to auscultation without rales, wheezing or rhonchi  ABDOMEN: Soft, non-tender, non-distended MUSCULOSKELETAL:  No edema; No deformity  SKIN: Warm and dry NEUROLOGIC:  Alert and oriented x 3 PSYCHIATRIC:  Normal affect   Assessment & Plan Nonrheumatic aortic (valve) stenosis The patient has severe, stage D1 aortic stenosis with NYHA functional class 3 symptoms of dyspnea and fatigue.  I personally reviewed his echo images which show a severely calcified restricted aortic valve.  Doppler data confirms severe aortic stenosis with a peak velocity greater than 4 m/s, mean gradient of 43 mmHg, dimensionless index of 0.19, and calculated aortic valve area of 0.6  cm.  I have reviewed the natural history of aortic stenosis with the patient and their family members who are present today. We have discussed the limitations of medical therapy and the poor prognosis associated with symptomatic aortic stenosis. We have reviewed potential treatment options, including palliative medical therapy, conventional surgical aortic valve replacement, and transcatheter aortic valve replacement. We discussed treatment options in the context of the patient's specific comorbid medical conditions.  The patient understands the need for further evaluation before undergoing TAVR.  He will require cardiac catheterization from femoral access due to the presence of bilateral AV fistula.  He does have a history of coronary artery disease but currently has no angina.  He will then require CT angiography studies of the heart and the chest, abdomen, and pelvis to assess for TAVR anatomy.  Once his studies are completed, his case will be reviewed by our multidisciplinary heart team and he will be referred for formal cardiac surgical consultation as part of a multidisciplinary approach to his care. Pre-procedure lab exam   PLAN: Cardiac cath (coronary angiography): femoral access Gated CTA heart/CTA chest,  abd, pelvis Removal of tunneled dialysis catheter when able CT surgical consultation once all pre-TAVR studies completed     Informed Consent   Shared Decision Making/Informed Consent The risks [stroke (1 in 1000), death (1 in 1000), kidney failure [usually temporary] (1 in 500), bleeding (1 in 200), allergic reaction [possibly serious] (1 in 200)], benefits (diagnostic support and management of coronary artery disease) and alternatives of a cardiac catheterization were discussed in detail with Mr. Rochford and he is willing to proceed.       Medication Adjustments/Labs and Tests Ordered: Current medicines are reviewed at length with the patient today.  Concerns regarding medicines are outlined above.  Orders Placed This Encounter  Procedures   CBC   Basic metabolic panel with GFR   EKG 87-Ozji   No orders of the defined types were placed in this encounter.   Patient Instructions  Medication Instructions:  No medication changes were made at this visit. Continue current regimen.   *If you need a refill on your cardiac medications before your next appointment, please call your pharmacy*  Lab Work: To be completed today: BMP and CBC  If you have labs (blood work) drawn today and your tests are completely normal, you will receive your results only by: MyChart Message (if you have MyChart) OR A paper copy in the mail If you have any lab test that is abnormal or we need to change your treatment, we will call you to review the results.  Testing/Procedures: Your physician has requested that you have a cardiac catheterization. Cardiac catheterization is used to diagnose and/or treat various heart conditions. Doctors may recommend this procedure for a number of different reasons. The most common reason is to evaluate chest pain. Chest pain can be a symptom of coronary artery disease (CAD), and cardiac catheterization can show whether plaque is narrowing or blocking your hearts arteries.  This procedure is also used to evaluate the valves, as well as measure the blood flow and oxygen levels in different parts of your heart. For further information please visit [email or web address] Please follow instruction sheet, as given.  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  Follow-Up: At [facility], you and your health needs are our priority.  As part of our continuing mission to provide you with exceptional heart care, our providers are all  part of one team.  This team includes your primary Cardiologist (physician) and Advanced Practice Providers or APPs (Physician Assistants and Nurse Practitioners) who all work together to provide you with the care you need, when you need it.  Your next appointment:   Per structural heart team  Other Instructions       Cardiac/Peripheral Catheterization   You are scheduled for a Cardiac Catheterization on Monday, February 2 with Dr. Lonni End.  1. Please arrive at the [facility] (Main Entrance A) at [facility]: [address] [place], [place] [address] at 7:00 AM (This time is 2 hour(s) before your procedure to ensure your preparation). Your procedure is scheduled to begin at 9 AM.  Free valet parking service is available. You will check in at ADMITTING. The support person will be asked to wait in the waiting room.  It is OK to have someone drop you off and come back when you are ready to be discharged.        Special note: Every effort is made to have your procedure done on time. Please understand that emergencies sometimes delay scheduled procedures.  2. Diet: Nothing to eat after midnight.  3. Hydration:You need to be well hydrated before your procedure. On February 2, you may drink approved liquids (see below) until 2 hours before the procedure, with 16 oz of water  as your last intake.   List of approved liquids water , Robert juice, Robert tea, black coffee, fruit  juices, non-citric and without pulp, carbonated beverages, Gatorade, Kool -Aid, plain Jello-O and plain ice popsicles.  4. Labs: You will need to have blood drawn on Friday, January 23 at [facility] (1st Floor), [address], [place], [place] [address]. You do not need to be fasting.  5. Medication instructions in preparation for your procedure:   Contrast Allergy : No  Stop taking, Torsemide  (Demadex ) Sunday, February 1,, Isosorbide  Mononitrate (Imdur ) Sunday, February 1, the last dose for these medications will be on Saturday, January 31.  On the morning of your procedure, take Aspirin  81 mg and any morning medicines NOT listed above.  You may use sips of water .  6. Plan to go home the same day, you will only stay overnight if medically necessary. 7. You MUST have a responsible adult to drive you home. 8. An adult MUST be with you the first 24 hours after you arrive home. 9. Bring a current list of your medications, and the last time and date medication taken. 10. Bring ID and current insurance cards. 11.Please wear clothes that are easy to get on and off and wear slip-on shoes.  Thank you for allowing us  to care for you!   -- [facility]     Signed, Ozell Fell, MD  04/30/2024 1:23 PM    [facility]     [1]  Current Meds  Medication Sig   acetaminophen  (TYLENOL ) 500 MG tablet Take 1,000 mg by mouth daily as needed for mild pain (pain score 1-3) or moderate pain (pain score 4-6).   amLODipine  (NORVASC ) 10 MG tablet TAKE 1 TABLET EVERY DAY (APPOINTMENT IS NEEDED FOR REFILLS)   atorvastatin  (LIPITOR) 10 MG tablet Take 1 tablet (10 mg total) by mouth daily.   B Complex-C-Folic Acid (DIALYVITE 800) 0.8 MG TABS Take 1 tablet by mouth daily.   Blood Glucose Monitoring Suppl (ACCU-CHEK AVIVA PLUS) w/Device KIT Test blood sugar 3 times daily. Dx code:  E11.22   Blood Pressure Monitoring (BLOOD  PRESS MONITOR/M-L CUFF) MISC 1 application by Does not apply route daily.   calcitRIOL  (ROCALTROL ) 0.25 MCG capsule Take 0.25 mcg by mouth Every Tuesday,Thursday,and Saturday with dialysis.   carvedilol  (COREG ) 25 MG tablet TAKE 1 TABLET TWICE DAILY. KEEP OFFICE VISIT   Cholecalciferol  (VITAMIN D -3 PO) Take 1 tablet by mouth daily.   Continuous Blood Gluc Sensor (FREESTYLE LIBRE SENSOR SYSTEM) MISC 1 application by Does not apply route daily.   Continuous Glucose Monitor Sup KIT 1 kit by Does not apply route as directed.   Continuous Glucose Receiver (DEXCOM G7 RECEIVER) DEVI Use as directed with compatible sensors to monitor blood sugar levels.   Doxercalciferol (HECTOROL IV) 7 mcg.   glucose blood (ACCU-CHEK AVIVA PLUS) test strip Test blood sugar 3 times daily. Dx code: E11.22   insulin  NPH-regular Human (NOVOLIN  70/30) (70-30) 100 UNIT/ML injection Inject 8 Units into the skin 2 (two) times daily with a meal.   Insulin  Syringe-Needle U-100 (B-D INS SYR HALF-UNIT .3CC/31G) 31G X 5/16 0.3 ML MISC Use daily at bedtime to inject insulin . Dx code: 250.00   isosorbide  mononitrate (IMDUR ) 60 MG 24 hr tablet TAKE 2 TABLETS EVERY DAY   Lancets (ACCU-CHEK MULTICLIX) lancets Test blood sugar 3 times daily. Dx code: E11.22   melatonin 3 MG TABS tablet Take 1 tablet (3 mg total) by mouth at bedtime.   Methoxy PEG-Epoetin  Beta (MIRCERA IJ) 75 mcg.   Methoxy PEG-Epoetin  Beta (MIRCERA IJ) 30 mcg.   Polyethyl Glycol-Propyl Glycol (SYSTANE) 0.4-0.3 % SOLN Place 1 drop into both eyes as needed (dry eyes).   sevelamer carbonate (RENVELA) 800 MG tablet Take 800 mg by mouth 3 (three) times daily with meals.   tamsulosin  (FLOMAX ) 0.4 MG CAPS capsule Take 0.4 mg by mouth daily.   torsemide  (DEMADEX ) 20 MG tablet Take 2 tablets (40 mg total) by mouth 2 (two) times daily. In case of weight gain 2 to 3 lbs in 24 hrs or 5 lbs in 7 days, take one extra tablet a day in the morning, until weight back to baseline. (Patient  taking differently: Take 20 mg by mouth daily.)   "

## 2024-04-30 NOTE — Progress Notes (Signed)
 Pre Surgical Assessment: 5 M Walk Test  27M=16.14ft  5 Meter Walk Test- trial 1: 8.13 seconds 5 Meter Walk Test- trial 2: 6.26 seconds 5 Meter Walk Test- trial 3: 8.14 seconds 5 Meter Walk Test Average: 7.51 seconds

## 2024-04-30 NOTE — Patient Instructions (Addendum)
 Medication Instructions:  No medication changes were made at this visit. Continue current regimen.   *If you need a refill on your cardiac medications before your next appointment, please call your pharmacy*  Lab Work: To be completed today: BMP and CBC  If you have labs (blood work) drawn today and your tests are completely normal, you will receive your results only by: MyChart Message (if you have MyChart) OR A paper copy in the mail If you have any lab test that is abnormal or we need to change your treatment, we will call you to review the results.  Testing/Procedures: Your physician has requested that you have a cardiac catheterization. Cardiac catheterization is used to diagnose and/or treat various heart conditions. Doctors may recommend this procedure for a number of different reasons. The most common reason is to evaluate chest pain. Chest pain can be a symptom of coronary artery disease (CAD), and cardiac catheterization can show whether plaque is narrowing or blocking your hearts arteries. This procedure is also used to evaluate the valves, as well as measure the blood flow and oxygen levels in different parts of your heart. For further information please visit [email or web address] Please follow instruction sheet, as given.  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  Follow-Up: At [facility], you and your health needs are our priority.  As part of our continuing mission to provide you with exceptional heart care, our providers are all part of one team.  This team includes your primary Cardiologist (physician) and Advanced Practice Providers or APPs (Physician Assistants and Nurse Practitioners) who all work together to provide you with the care you need, when you need it.  Your next appointment:   Per structural heart team  Other Instructions       Cardiac/Peripheral Catheterization   You are scheduled for a Cardiac  Catheterization on Monday, February 2 with Dr. Lonni End.  1. Please arrive at the [facility] (Main Entrance A) at [facility]: [address] [place], [place] [address] at 7:00 AM (This time is 2 hour(s) before your procedure to ensure your preparation). Your procedure is scheduled to begin at 9 AM.  Free valet parking service is available. You will check in at ADMITTING. The support person will be asked to wait in the waiting room.  It is OK to have someone drop you off and come back when you are ready to be discharged.        Special note: Every effort is made to have your procedure done on time. Please understand that emergencies sometimes delay scheduled procedures.  2. Diet: Nothing to eat after midnight.  3. Hydration:You need to be well hydrated before your procedure. On February 2, you may drink approved liquids (see below) until 2 hours before the procedure, with 16 oz of water  as your last intake.   List of approved liquids water , clear juice, clear tea, black coffee, fruit juices, non-citric and without pulp, carbonated beverages, Gatorade, Kool -Aid, plain Jello-O and plain ice popsicles.  4. Labs: You will need to have blood drawn on Friday, January 23 at [facility] (1st Floor), [address] [place], [place] [address]. You do not need to be fasting.  5. Medication instructions in preparation for your procedure:   Contrast Allergy : No  Stop taking, Torsemide  (Demadex ) Sunday, February 1,, Isosorbide  Mononitrate (Imdur ) Sunday, February 1, the last dose for  these medications will be on Saturday, January 31.  On the morning of your procedure, take Aspirin  81 mg and any morning medicines NOT listed above.  You may use sips of water .  6. Plan to go home the same day, you will only stay overnight if medically necessary. 7. You MUST have a responsible adult to drive you home. 8. An adult MUST be with you the first 24  hours after you arrive home. 9. Bring a current list of your medications, and the last time and date medication taken. 10. Bring ID and current insurance cards. 11.Please wear clothes that are easy to get on and off and wear slip-on shoes.  Thank you for allowing us  to care for you!   -- [facility]

## 2024-04-30 NOTE — Progress Notes (Signed)
 " Cardiology Office Note:    Date:  04/30/2024   ID:  [name], DOB 11/30/1933, MRN [id number]  PCP:  Teresa Thersia Jansky, MD   [facility] Providers Cardiologist:  Dorn Lesches, MD Electrophysiologist:  Lynwood Rakers, MD (Inactive)     Referring MD: Teresa Thersia Jansky, MD   Chief Complaint  Patient presents with   Shortness of Breath    History of Present Illness:    [name] is a 89 y.o. male referred by Dr Lesches for evaluation of aortic stenosis.  The patient has a past history of coronary artery disease status post LAD stenting in 2009.  Other comorbidities include advanced age, hypertension, diabetes, and prostate cancer.  He has developed end-stage renal disease and is dialyzed via a left arm AV fistula.  Over the past year the patient has developed progressive aortic valve stenosis, now in the severe range.  Recent echocardiogram shows an LVEF of 70 to 75% with grade 2 diastolic dysfunction, normal RV function, no significant mitral valve disease, and severe aortic stenosis with a mean gradient of 43 mmHg, V-max of 4.3 m/s, dimensionless index of 0.19, and calculated aortic valve area of 0.6 cm.  The patient has had no recent cardiac catheterization or CTA studies performed.  The patient is here with his wife today.  He spent much of his life in [place], [place] .  His wife is originally from [place], [place] .  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  He still enjoys playing golf, but recently has been unable to play due to dyspnea and fatigue.  The patient remains remarkably active and appears quite vibrant.  He has been on dialysis now since last year.  Reports that he tolerates dialysis fairly well.  He is compliant with diet and medications.  Over the last several months he has developed worsening shortness of breath with exercise and exercise intolerance.  He has no chest pain or pressure.  He denies edema, orthopnea, or PND.  He had  a failed left arm AV fistula.  He has a tunneled right-sided dialysis catheter.  He has had a right arm AV fistula that has been used over his last few dialysis sessions without problems.  He understands the need to have his tunneled catheter removed before any heart valve intervention.  The patient has to catheterize his bladder about twice daily as he is still making urine and has problems with voiding dysfunction.  Dental hx: Edentulous with full dentures  Past Medical History:  Diagnosis Date   Allergy     Anemia    CAD (coronary artery disease)    pci to LAD 10/09; stable CAD by cath 05/31/08; myoview 04/11/11- no ishcemia, EF 67%; echo 05/15/09- EF>55%, mod calcification of the aortic valve leaflets   CAD S/P percutaneous coronary angioplasty 05/08/2008   LAD PCI with DES 2009- Myoview low risk 2013   CHF (congestive heart failure) (HCC) 09/07/2019   Chronic back pain    Chronic kidney disease    Cystitis, radiation 12/01/2013   Dyspnea    ED (erectile dysfunction)    Elevated serum creatinine 12/14/2015   Essential hypertension, benign    Hematuria 12/01/2013   History of blood transfusion    Hyperlipidemia    Ileus (HCC) 12/14/2015   Insulin  dependent type 2 diabetes mellitus, controlled (HCC)    Malignant neoplasm of prostate (HCC) 11/26/2013   Multiple rib fractures    left 9th, 10th, and 11th posterior  rib fractures S/P fall 12/09/2015/notes 12/12/2015   Osteoarthritis of right knee 03/04/2018   Personal history of COVID-19 09/05/2021   PREMATURE VENTRICULAR CONTRACTIONS 05/08/2008   Qualifier: Diagnosis of  By: Kelsie, MD, James     Prostate cancer [facility])    s/p   Rectal bleeding 12/23/2012   Evaluated by Dr Rollin GI patient with history of radiation proctitis. Patient due for colonoscopy on 04/2014    Type II or unspecified type diabetes mellitus without mention of complication, not stated as uncontrolled    Ventricular tachycardia (HCC)    resuscitated; monitor 05/2008;  attempted T ablation 2/10- aborted due to inappropriate substrate   Past Surgical History:  Procedure Laterality Date   A/V SHUNT INTERVENTION [name] 08/08/2023   Procedure: A/V SHUNT INTERVENTION;  Surgeon: Tobie Gordy POUR, MD;  Location: [facility] INVASIVE CV LAB;  Service: Cardiovascular;  Laterality: [name];   A/V SHUNT INTERVENTION [name] 09/03/2023   Procedure: A/V SHUNT INTERVENTION;  Surgeon: Lanis Fonda BRAVO, MD;  Location: [facility] PV LAB;  Service: Cardiovascular;  Laterality: [name];   AV FISTULA PLACEMENT Left 12/02/2022   Procedure: LEFT ARM BRACHIOCEPHALIC ARTERIOVENOUS (AV) FISTULA CREATION;  Surgeon: Gretta Lonni PARAS, MD;  Location: [facility] OR;  Service: Vascular;  Laterality: Left;   AV FISTULA PLACEMENT Left 01/22/2023   Procedure: LEFT ARM BRACHIOBASILISC ARTERIOVENOUS (AV) FISTULA CREATION;  Surgeon: Gretta Lonni PARAS, MD;  Location: [facility] OR;  Service: Vascular;  Laterality: Left;   BASCILIC VEIN TRANSPOSITION Left 05/02/2023   Procedure: Insertion of LEFT Arm Arterio-venous Gortex Graft;  Surgeon: Gretta Lonni PARAS, MD;  Location: [facility] OR;  Service: Vascular;  Laterality: Left;   CARDIAC CATHETERIZATION  05/31/08   EF 55-60%, stable lesions, medical therapy   COLECTOMY  '80's   polyps   CORONARY ANGIOPLASTY WITH STENT PLACEMENT  01/19/08   PCI stent to mid LAD with Promus DES 27.5x28   CYSTOSCOPY WITH DIRECT VISION INTERNAL URETHROTOMY [name] 03/12/2021   Procedure: cystoscoopy withdirect vision internal urethrotomy optilum urethral dilation     ;  Surgeon: Carolee Sherwood JONETTA DOUGLAS, MD;  Location: [facility] ORS;  Service: Urology;  Laterality: [name];  RERQUESTING 45 MINS   DIALYSIS/PERMA CATHETER INSERTION [name] 04/10/2023   Procedure: DIALYSIS/PERMA CATHETER INSERTION;  Surgeon: Tobie Gordy POUR, MD;  Location: [facility] INVASIVE CV LAB;  Service: Cardiovascular;  Laterality: [name];   DIALYSIS/PERMA CATHETER INSERTION [name] 09/05/2023   Procedure: DIALYSIS/PERMA CATHETER INSERTION;  Surgeon: Serene Gaile ORN, MD;  Location: [facility] PV LAB;   Service: Cardiovascular;  Laterality: [name];   INSERTION OF ARTERIOVENOUS (AV) ARTEGRAFT ARM Right 09/24/2023   Procedure: INSERTION, GRAFT, ARTERIOVENOUS, UPPER EXTREMITY;  Surgeon: Magda Debby SAILOR, MD;  Location: [facility] OR;  Service: Vascular;  Laterality: Right;   INSERTION OF ARTERIOVENOUS (AV) ARTEGRAFT ARM Right 03/10/2024   Procedure: INSERTION, GRAFT, ARTERIOVENOUS, UPPER EXTREMITY;  Surgeon: Gretta Lonni PARAS, MD;  Location: [facility] OR;  Service: Vascular;  Laterality: Right;   IR GENERIC HISTORICAL  01/03/2016   IR THORACENTESIS ASP PLEURAL SPACE W/IMG GUIDE 01/03/2016 [facility]-INTER[facility] RAD   IR REMOVAL TUN CV CATH W/O FL  11/07/2023   IR TUNNELED CENTRAL VENOUS CATH PLC W IMG  12/09/2023   KNEE ARTHROPLASTY Right 03/04/2018   Procedure: COMPUTER ASSISTED TOTAL KNEE ARTHROPLASTY;  Surgeon: Fidel Rogue, MD;  Location: [facility] ORS;  Service: Orthopedics;  Laterality: Right;   PERIPHERAL VASCULAR THROMBECTOMY  09/03/2023   Procedure: PERIPHERAL VASCULAR THROMBECTOMY;  Surgeon: Lanis Fonda BRAVO, MD;  Location: [facility];  Service: Cardiovascular;;  avf  PTCA  01/2008   TRANSURETHRAL RESECTION OF BLADDER TUMOR [name] 11/10/2020   Procedure: PHYLLIS BOYCE CHARLESETTA GEROLD;  Surgeon: Sherrilee Belvie CROME, MD;  Location: [facility]S;  Service: Urology;  Laterality: [name];   TRANSURETHRAL RESECTION OF BLADDER TUMOR [name] 08/12/2022   Procedure: TRANSURETHRAL RESECTION OF BLADDER TUMOR (TURBT);  Surgeon: Carolee Sherwood JONETTA DOUGLAS, MD;  Location: [facility] ORS;  Service: Urology;  Laterality: [name];   TRANSURETHRAL RESECTION OF BLADDER TUMOR WITH MITOMYCIN -C [name] 09/17/2021   Procedure: TRANSURETHRAL RESECTION OF BLADDER TUMOR WITH GEMCITABINE ;  Surgeon: Carolee Sherwood JONETTA DOUGLAS, MD;  Location: [facility]S;  Service: Urology;  Laterality: [name];   UPPER EXTREMITY VENOGRAPHY Bilateral 02/12/2024   Procedure: UPPER EXTREMITY VENOGRAPHY;  Surgeon: Gretta Lonni PARAS, MD;  Location: [facility] INVASIVE CV LAB;  Service: Cardiovascular;  Laterality: Bilateral;    VENOUS STENT  09/03/2023   Procedure: VENOUS STENT;  Surgeon: Lanis Fonda BRAVO, MD;  Location: [facility];  Service: Cardiovascular;;  avf    Current Medications: Active Medications[1]   Allergies:   Patient has no known allergies.   ROS:   Please see the history of present illness.    All other systems reviewed and are negative.  EKGs/Labs/Other Studies Reviewed:    The following studies were reviewed today: Cardiac Studies & Procedures   ______________________________________________________________________________________________   STRESS TESTS  NM MYOCAR MULTI W/SPECT W 04/11/2011   ECHOCARDIOGRAM  ECHOCARDIOGRAM COMPLETE 02/25/2024  Narrative ECHOCARDIOGRAM REPORT    Patient Name:   [name] Date of Exam: 02/25/2024 Medical Rec #:  [id number]       Height:       72.0 in Accession #:    [id number]      Weight:       202.0 lb Date of Birth:  14-Nov-1933      BSA:          2.140 m Patient Age:    89 years        BP:           127/70 mmHg Patient Gender: M               HR:           80 bpm. Exam Location:  [address]  Procedure: 2D Echo and Strain Analysis (Both Spectral and Color Flow Doppler were utilized during procedure).  Indications:    I35.9* Aortic valve disease, unspecified  History:        Patient has prior history of Echocardiogram examinations, most recent 02/09/2023. CHF, CAD, Arrythmias:PVC; Risk Factors:Hypertension, Diabetes, Dyslipidemia and Former Smoker. Aortic stenosis. S/P percutaneous coronary angioplasty. ESRD. History of ventricular tachycardia. Lower extremity edema.  Sonographer:    Jon Hacker RCS Referring Phys: [phone] JONATHAN J BERRY  IMPRESSIONS   1. Left ventricular ejection fraction, by estimation, is 70 to 75%. The left ventricle has hyperdynamic function. The left ventricle has no regional wall motion abnormalities. Left ventricular diastolic parameters are consistent with Grade II diastolic dysfunction  (pseudonormalization). Elevated left atrial pressure. The average left ventricular global longitudinal strain is -23.3 %. The global longitudinal strain is normal. 2. Right ventricular systolic function is normal. The right ventricular size is normal. 3. Left atrial size was moderately dilated. 4. The mitral valve is normal in structure. No evidence of mitral valve regurgitation. No evidence of mitral stenosis. 5. The aortic valve is normal in structure. There is moderate calcification of the aortic valve. There is moderate thickening of the aortic valve. Aortic valve regurgitation is moderate.  Severe aortic valve stenosis. Aortic valve area, by VTI measures 0.61 cm. Aortic valve mean gradient measures 43.0 mmHg. Aortic valve Vmax measures 4.26 m/s. 6. The inferior vena cava is normal in size with greater than 50% respiratory variability, suggesting right atrial pressure of 3 mmHg.  Comparison(s): Changes from prior study are noted. Worsening AV disease.  FINDINGS Left Ventricle: Hyperdynamic LV with mild gradient, increased to with valsalva. Left ventricular ejection fraction, by estimation, is 70 to 75%. The left ventricle has hyperdynamic function. The left ventricle has no regional wall motion abnormalities. The average left ventricular global longitudinal strain is -23.3 %. Strain was performed and the global longitudinal strain is normal. The left ventricular internal cavity size was normal in size. There is no left ventricular hypertrophy. Left ventricular diastolic parameters are consistent with Grade II diastolic dysfunction (pseudonormalization). Elevated left atrial pressure.  Right Ventricle: The right ventricular size is normal. No increase in right ventricular wall thickness. Right ventricular systolic function is normal.  Left Atrium: Left atrial size was moderately dilated.  Right Atrium: Right atrial size was normal in size.  Pericardium: There is no evidence of  pericardial effusion.  Mitral Valve: The mitral valve is normal in structure. Mild mitral annular calcification. No evidence of mitral valve regurgitation. No evidence of mitral valve stenosis.  Tricuspid Valve: The tricuspid valve is normal in structure. Tricuspid valve regurgitation is mild . No evidence of tricuspid stenosis.  Aortic Valve: The aortic valve is normal in structure. There is moderate calcification of the aortic valve. There is moderate thickening of the aortic valve. Aortic valve regurgitation is moderate. Severe aortic stenosis is present. Aortic valve mean gradient measures 43.0 mmHg. Aortic valve peak gradient measures 72.6 mmHg. Aortic valve area, by VTI measures 0.61 cm.  Pulmonic Valve: The pulmonic valve was normal in structure. Pulmonic valve regurgitation is not visualized. No evidence of pulmonic stenosis.  Aorta: The aortic root is normal in size and structure.  Venous: The inferior vena cava is normal in size with greater than 50% respiratory variability, suggesting right atrial pressure of 3 mmHg.  IAS/Shunts: No atrial level shunt detected by color flow Doppler.   LEFT VENTRICLE PLAX 2D LVIDd:         4.28 cm   Diastology LVIDs:         2.83 cm   LV e' medial:    5.08 cm/s LV PW:         1.23 cm   LV E/e' medial:  26.6 LV IVS:        1.21 cm   LV e' lateral:   4.03 cm/s LVOT diam:     2.00 cm   LV E/e' lateral: 33.5 LV SV:         60 LV SV Index:   28        2D Longitudinal Strain LVOT Area:     3.14 cm  2D Strain GLS (A4C):   -21.2 % 2D Strain GLS (A3C):   -26.9 % 2D Strain GLS (A2C):   -21.6 % 2D Strain GLS Avg:     -23.3 %  RIGHT VENTRICLE RV Basal diam:  3.97 cm RV S prime:     14.00 cm/s TAPSE (M-mode): 2.5 cm  LEFT ATRIUM             Index        RIGHT ATRIUM           Index LA diam:  4.20 cm 1.96 cm/m   RA Area:     21.50 cm LA Vol (A2C):   74.1 ml 34.63 ml/m  RA Volume:   50.70 ml  23.70 ml/m LA Vol (A4C):   85.7 ml 40.05  ml/m LA Biplane Vol: 87.4 ml 40.85 ml/m AORTIC VALVE AV Area (Vmax):    0.74 cm AV Area (Vmean):   0.70 cm AV Area (VTI):     0.61 cm AV Vmax:           426.00 cm/s AV Vmean:          311.000 cm/s AV VTI:            0.991 m AV Peak Grad:      72.6 mmHg AV Mean Grad:      43.0 mmHg LVOT Vmax:         101.00 cm/s LVOT Vmean:        69.500 cm/s LVOT VTI:          0.192 m LVOT/AV VTI ratio: 0.19  AORTA Ao Root diam: 3.80 cm Ao Asc diam:  3.70 cm  MITRAL VALVE MV Area (PHT): 2.17 cm     SHUNTS MV Decel Time: 349 msec     Systemic VTI:  0.19 m MV E velocity: 135.00 cm/s  Systemic Diam: 2.00 cm MV A velocity: 161.00 cm/s MV E/A ratio:  0.84  Morene Brownie Electronically signed by Morene Brownie Signature Date/Time: 02/25/2024/3:14:49 PM    Final          ______________________________________________________________________________________________      EKG:        Recent Labs: 12/10/2023: Platelets 219 03/10/2024: BUN 35; Creatinine, Ser 8.60; Hemoglobin 11.6; Potassium 4.6; Sodium 134  Recent Lipid Panel    Component Value Date/Time   CHOL 125 02/07/2021 0920   TRIG 111 02/07/2021 0920   HDL 38 (L) 02/07/2021 0920   CHOLHDL 3.3 02/07/2021 0920   CHOLHDL 2.8 08/30/2015 0805   VLDL 20 08/30/2015 0805   LDLCALC 67 02/07/2021 0920     Risk Assessment/Calculations:                Physical Exam:    VS:  BP (!) 116/50 (BP Location: Left Arm, Patient Position: Sitting, Cuff Size: Normal)   Pulse 70   Ht 6' (1.829 m)   Wt 205 lb 11.2 oz (93.3 kg)   SpO2 95%   BMI 27.90 kg/m     Wt Readings from Last 3 Encounters:  04/30/24 205 lb 11.2 oz (93.3 kg)  04/12/24 206 lb (93.4 kg)  03/15/24 208 lb (94.3 kg)     GEN:  Well nourished, well developed elderly male in no acute distress HEENT: Normal NECK: No JVD; No carotid bruits LYMPHATICS: No lymphadenopathy CARDIAC: RRR, 2/6 harsh crescendo decrescendo murmur at the right upper sternal border  with absent A2 RESPIRATORY:  Clear to auscultation without rales, wheezing or rhonchi  ABDOMEN: Soft, non-tender, non-distended MUSCULOSKELETAL:  No edema; No deformity  SKIN: Warm and dry NEUROLOGIC:  Alert and oriented x 3 PSYCHIATRIC:  Normal affect   Assessment & Plan Nonrheumatic aortic (valve) stenosis The patient has severe, stage D1 aortic stenosis with NYHA functional class 3 symptoms of dyspnea and fatigue.  I personally reviewed his echo images which show a severely calcified restricted aortic valve.  Doppler data confirms severe aortic stenosis with a peak velocity greater than 4 m/s, mean gradient of 43 mmHg, dimensionless index of 0.19, and calculated aortic valve area of 0.6  cm.  I have reviewed the natural history of aortic stenosis with the patient and their family members who are present today. We have discussed the limitations of medical therapy and the poor prognosis associated with symptomatic aortic stenosis. We have reviewed potential treatment options, including palliative medical therapy, conventional surgical aortic valve replacement, and transcatheter aortic valve replacement. We discussed treatment options in the context of the patient's specific comorbid medical conditions.  The patient understands the need for further evaluation before undergoing TAVR.  He will require cardiac catheterization from femoral access due to the presence of bilateral AV fistula.  He does have a history of coronary artery disease but currently has no angina.  He will then require CT angiography studies of the heart and the chest, abdomen, and pelvis to assess for TAVR anatomy.  Once his studies are completed, his case will be reviewed by our multidisciplinary heart team and he will be referred for formal cardiac surgical consultation as part of a multidisciplinary approach to his care. Pre-procedure lab exam   PLAN: Cardiac cath (coronary angiography): femoral access Gated CTA heart/CTA chest,  abd, pelvis Removal of tunneled dialysis catheter when able CT surgical consultation once all pre-TAVR studies completed     Informed Consent   Shared Decision Making/Informed Consent The risks [stroke (1 in 1000), death (1 in 1000), kidney failure [usually temporary] (1 in 500), bleeding (1 in 200), allergic reaction [possibly serious] (1 in 200)], benefits (diagnostic support and management of coronary artery disease) and alternatives of a cardiac catheterization were discussed in detail with Mr. Rochford and he is willing to proceed.       Medication Adjustments/Labs and Tests Ordered: Current medicines are reviewed at length with the patient today.  Concerns regarding medicines are outlined above.  Orders Placed This Encounter  Procedures   CBC   Basic metabolic panel with GFR   EKG 87-Ozji   No orders of the defined types were placed in this encounter.   Patient Instructions  Medication Instructions:  No medication changes were made at this visit. Continue current regimen.   *If you need a refill on your cardiac medications before your next appointment, please call your pharmacy*  Lab Work: To be completed today: BMP and CBC  If you have labs (blood work) drawn today and your tests are completely normal, you will receive your results only by: MyChart Message (if you have MyChart) OR A paper copy in the mail If you have any lab test that is abnormal or we need to change your treatment, we will call you to review the results.  Testing/Procedures: Your physician has requested that you have a cardiac catheterization. Cardiac catheterization is used to diagnose and/or treat various heart conditions. Doctors may recommend this procedure for a number of different reasons. The most common reason is to evaluate chest pain. Chest pain can be a symptom of coronary artery disease (CAD), and cardiac catheterization can show whether plaque is narrowing or blocking your hearts arteries.  This procedure is also used to evaluate the valves, as well as measure the blood flow and oxygen levels in different parts of your heart. For further information please visit [email or web address] Please follow instruction sheet, as given.  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  Follow-Up: At [facility], you and your health needs are our priority.  As part of our continuing mission to provide you with exceptional heart care, our providers are all  part of one team.  This team includes your primary Cardiologist (physician) and Advanced Practice Providers or APPs (Physician Assistants and Nurse Practitioners) who all work together to provide you with the care you need, when you need it.  Your next appointment:   Per structural heart team  Other Instructions       Cardiac/Peripheral Catheterization   You are scheduled for a Cardiac Catheterization on Monday, February 2 with Dr. Lonni End.  1. Please arrive at the [facility] (Main Entrance A) at [facility]: [address] [place], [place] [address] at 7:00 AM (This time is 2 hour(s) before your procedure to ensure your preparation). Your procedure is scheduled to begin at 9 AM.  Free valet parking service is available. You will check in at ADMITTING. The support person will be asked to wait in the waiting room.  It is OK to have someone drop you off and come back when you are ready to be discharged.        Special note: Every effort is made to have your procedure done on time. Please understand that emergencies sometimes delay scheduled procedures.  2. Diet: Nothing to eat after midnight.  3. Hydration:You need to be well hydrated before your procedure. On February 2, you may drink approved liquids (see below) until 2 hours before the procedure, with 16 oz of water  as your last intake.   List of approved liquids water , clear juice, clear tea, black coffee, fruit  juices, non-citric and without pulp, carbonated beverages, Gatorade, Kool -Aid, plain Jello-O and plain ice popsicles.  4. Labs: You will need to have blood drawn on Friday, January 23 at [facility] (1st Floor), [address], [place], [place] [address]. You do not need to be fasting.  5. Medication instructions in preparation for your procedure:   Contrast Allergy : No  Stop taking, Torsemide  (Demadex ) Sunday, February 1,, Isosorbide  Mononitrate (Imdur ) Sunday, February 1, the last dose for these medications will be on Saturday, January 31.  On the morning of your procedure, take Aspirin  81 mg and any morning medicines NOT listed above.  You may use sips of water .  6. Plan to go home the same day, you will only stay overnight if medically necessary. 7. You MUST have a responsible adult to drive you home. 8. An adult MUST be with you the first 24 hours after you arrive home. 9. Bring a current list of your medications, and the last time and date medication taken. 10. Bring ID and current insurance cards. 11.Please wear clothes that are easy to get on and off and wear slip-on shoes.  Thank you for allowing us  to care for you!   -- [facility]     Signed, Ozell Fell, MD  04/30/2024 1:23 PM    [facility]     [1]  Current Meds  Medication Sig   acetaminophen  (TYLENOL ) 500 MG tablet Take 1,000 mg by mouth daily as needed for mild pain (pain score 1-3) or moderate pain (pain score 4-6).   amLODipine  (NORVASC ) 10 MG tablet TAKE 1 TABLET EVERY DAY (APPOINTMENT IS NEEDED FOR REFILLS)   atorvastatin  (LIPITOR) 10 MG tablet Take 1 tablet (10 mg total) by mouth daily.   B Complex-C-Folic Acid (DIALYVITE 800) 0.8 MG TABS Take 1 tablet by mouth daily.   Blood Glucose Monitoring Suppl (ACCU-CHEK AVIVA PLUS) w/Device KIT Test blood sugar 3 times daily. Dx code:  E11.22   Blood Pressure Monitoring (BLOOD  PRESS MONITOR/M-L CUFF) MISC 1 application by Does not apply route daily.   calcitRIOL  (ROCALTROL ) 0.25 MCG capsule Take 0.25 mcg by mouth Every Tuesday,Thursday,and Saturday with dialysis.   carvedilol  (COREG ) 25 MG tablet TAKE 1 TABLET TWICE DAILY. KEEP OFFICE VISIT   Cholecalciferol  (VITAMIN D -3 PO) Take 1 tablet by mouth daily.   Continuous Blood Gluc Sensor (FREESTYLE LIBRE SENSOR SYSTEM) MISC 1 application by Does not apply route daily.   Continuous Glucose Monitor Sup KIT 1 kit by Does not apply route as directed.   Continuous Glucose Receiver (DEXCOM G7 RECEIVER) DEVI Use as directed with compatible sensors to monitor blood sugar levels.   Doxercalciferol (HECTOROL IV) 7 mcg.   glucose blood (ACCU-CHEK AVIVA PLUS) test strip Test blood sugar 3 times daily. Dx code: E11.22   insulin  NPH-regular Human (NOVOLIN  70/30) (70-30) 100 UNIT/ML injection Inject 8 Units into the skin 2 (two) times daily with a meal.   Insulin  Syringe-Needle U-100 (B-D INS SYR HALF-UNIT .3CC/31G) 31G X 5/16 0.3 ML MISC Use daily at bedtime to inject insulin . Dx code: 250.00   isosorbide  mononitrate (IMDUR ) 60 MG 24 hr tablet TAKE 2 TABLETS EVERY DAY   Lancets (ACCU-CHEK MULTICLIX) lancets Test blood sugar 3 times daily. Dx code: E11.22   melatonin 3 MG TABS tablet Take 1 tablet (3 mg total) by mouth at bedtime.   Methoxy PEG-Epoetin  Beta (MIRCERA IJ) 75 mcg.   Methoxy PEG-Epoetin  Beta (MIRCERA IJ) 30 mcg.   Polyethyl Glycol-Propyl Glycol (SYSTANE) 0.4-0.3 % SOLN Place 1 drop into both eyes as needed (dry eyes).   sevelamer carbonate (RENVELA) 800 MG tablet Take 800 mg by mouth 3 (three) times daily with meals.   tamsulosin  (FLOMAX ) 0.4 MG CAPS capsule Take 0.4 mg by mouth daily.   torsemide  (DEMADEX ) 20 MG tablet Take 2 tablets (40 mg total) by mouth 2 (two) times daily. In case of weight gain 2 to 3 lbs in 24 hrs or 5 lbs in 7 days, take one extra tablet a day in the morning, until weight back to baseline. (Patient  taking differently: Take 20 mg by mouth daily.)   "

## 2024-05-01 ENCOUNTER — Ambulatory Visit: Payer: Self-pay | Admitting: Cardiovascular Disease

## 2024-05-06 ENCOUNTER — Other Ambulatory Visit (HOSPITAL_COMMUNITY): Payer: Self-pay | Admitting: Nephrology

## 2024-05-06 ENCOUNTER — Telehealth: Payer: Self-pay | Admitting: *Deleted

## 2024-05-06 DIAGNOSIS — Z992 Dependence on renal dialysis: Secondary | ICD-10-CM

## 2024-05-06 NOTE — Telephone Encounter (Addendum)
 Cardiac Catheterization scheduled at [facility] for: Monday May 10, 2024 9 AM Arrival time [facility] Main Entrance A at: 7 AM TThS-dialysis  Diet: -Nothing to eat after midnight.  Hydration: -May drink clear liquids until 2 hours before the procedure.  Approved liquids: Water , clear tea, black coffee, fruit juices-non-citric and without pulp,Gatorade, plain Jello/popsicles.  No PO hydration (bottle of water ) -dialysis patient  Medication instructions: -Hold:  Insulin /Torsemide -AM of procedure -Other usual morning medications can be taken including aspirin  81 mg.  Plan to go home the same day, you will only stay overnight if medically necessary.  You must have responsible adult to drive you home.  Someone must be with you the first 24 hours after you arrive home.  Reviewed procedure instructions with patient's daughter (DPR), Nichole.

## 2024-05-10 ENCOUNTER — Other Ambulatory Visit: Payer: Self-pay

## 2024-05-10 ENCOUNTER — Encounter (HOSPITAL_COMMUNITY)
Admission: RE | Disposition: A | Discharge status: Home / Self Care | Payer: Self-pay | Source: Home / Self Care | Attending: Internal Medicine

## 2024-05-10 ENCOUNTER — Ambulatory Visit (HOSPITAL_COMMUNITY)
Admission: RE | Admit: 2024-05-10 | Discharge: 2024-05-10 | Disposition: A | Discharge status: Home / Self Care | Attending: Internal Medicine | Admitting: Internal Medicine

## 2024-05-10 DIAGNOSIS — I132 Hypertensive heart and chronic kidney disease with heart failure and with stage 5 chronic kidney disease, or end stage renal disease: Secondary | ICD-10-CM | POA: Insufficient documentation

## 2024-05-10 DIAGNOSIS — N186 End stage renal disease: Secondary | ICD-10-CM | POA: Insufficient documentation

## 2024-05-10 DIAGNOSIS — E785 Hyperlipidemia, unspecified: Secondary | ICD-10-CM | POA: Insufficient documentation

## 2024-05-10 DIAGNOSIS — Z8546 Personal history of malignant neoplasm of prostate: Secondary | ICD-10-CM | POA: Insufficient documentation

## 2024-05-10 DIAGNOSIS — Z794 Long term (current) use of insulin: Secondary | ICD-10-CM | POA: Insufficient documentation

## 2024-05-10 DIAGNOSIS — E1122 Type 2 diabetes mellitus with diabetic chronic kidney disease: Secondary | ICD-10-CM | POA: Insufficient documentation

## 2024-05-10 DIAGNOSIS — I5032 Chronic diastolic (congestive) heart failure: Secondary | ICD-10-CM | POA: Insufficient documentation

## 2024-05-10 DIAGNOSIS — Z992 Dependence on renal dialysis: Secondary | ICD-10-CM | POA: Insufficient documentation

## 2024-05-10 DIAGNOSIS — I350 Nonrheumatic aortic (valve) stenosis: Secondary | ICD-10-CM | POA: Insufficient documentation

## 2024-05-10 DIAGNOSIS — Z79899 Other long term (current) drug therapy: Secondary | ICD-10-CM | POA: Insufficient documentation

## 2024-05-10 DIAGNOSIS — I2510 Atherosclerotic heart disease of native coronary artery without angina pectoris: Secondary | ICD-10-CM | POA: Insufficient documentation

## 2024-05-10 DIAGNOSIS — T82855A Stenosis of coronary artery stent, initial encounter: Secondary | ICD-10-CM | POA: Insufficient documentation

## 2024-05-10 DIAGNOSIS — Y831 Surgical operation with implant of artificial internal device as the cause of abnormal reaction of the patient, or of later complication, without mention of misadventure at the time of the procedure: Secondary | ICD-10-CM | POA: Insufficient documentation

## 2024-05-10 LAB — POCT I-STAT 7, (LYTES, BLD GAS, ICA,H+H)
Acid-Base Excess: 3 mmol/L — ABNORMAL HIGH (ref 0.0–2.0)
Bicarbonate: 26.7 mmol/L (ref 20.0–28.0)
Calcium, Ion: 1.12 mmol/L — ABNORMAL LOW (ref 1.15–1.40)
HCT: 31 % — ABNORMAL LOW (ref 39.0–52.0)
Hemoglobin: 10.5 g/dL — ABNORMAL LOW (ref 13.0–17.0)
O2 Saturation: 93 %
Potassium: 4.2 mmol/L (ref 3.5–5.1)
Sodium: 139 mmol/L (ref 135–145)
TCO2: 28 mmol/L (ref 22–32)
pCO2 arterial: 37.7 mmHg (ref 32–48)
pH, Arterial: 7.459 — ABNORMAL HIGH (ref 7.35–7.45)
pO2, Arterial: 62 mmHg — ABNORMAL LOW (ref 83–108)

## 2024-05-10 LAB — POCT I-STAT EG7
Acid-Base Excess: 4 mmol/L — ABNORMAL HIGH (ref 0.0–2.0)
Bicarbonate: 28.7 mmol/L — ABNORMAL HIGH (ref 20.0–28.0)
Calcium, Ion: 1.18 mmol/L (ref 1.15–1.40)
HCT: 32 % — ABNORMAL LOW (ref 39.0–52.0)
Hemoglobin: 10.9 g/dL — ABNORMAL LOW (ref 13.0–17.0)
O2 Saturation: 68 %
Potassium: 4.4 mmol/L (ref 3.5–5.1)
Sodium: 137 mmol/L (ref 135–145)
TCO2: 30 mmol/L (ref 22–32)
pCO2, Ven: 43.5 mmHg — ABNORMAL LOW (ref 44–60)
pH, Ven: 7.427 (ref 7.25–7.43)
pO2, Ven: 35 mmHg (ref 32–45)

## 2024-05-10 LAB — GLUCOSE, CAPILLARY
Glucose-Capillary: 108 mg/dL — ABNORMAL HIGH (ref 70–99)
Glucose-Capillary: 83 mg/dL (ref 70–99)
Glucose-Capillary: 89 mg/dL (ref 70–99)

## 2024-05-10 MED ORDER — LABETALOL HCL 5 MG/ML IV SOLN
INTRAVENOUS | Status: AC
  Administered 2024-05-10: 10 mg via INTRAVENOUS
  Filled 2024-05-10: qty 4

## 2024-05-10 MED ORDER — LIDOCAINE HCL (PF) 1 % IJ SOLN
INTRAMUSCULAR | Status: AC
  Filled 2024-05-10: qty 30

## 2024-05-10 MED ORDER — IODIXANOL 320 MG/ML IV SOLN
INTRAVENOUS | Status: DC | PRN
  Administered 2024-05-10: 55 mL

## 2024-05-10 MED ORDER — SODIUM CHLORIDE 0.9% FLUSH: 3.0000 mL | INTRAVENOUS | Status: DC | PRN

## 2024-05-10 MED ORDER — VERAPAMIL HCL 2.5 MG/ML IV SOLN
INTRAVENOUS | Status: AC
  Filled 2024-05-10: qty 2

## 2024-05-10 MED ORDER — FENTANYL CITRATE (PF) 100 MCG/2ML IJ SOLN
INTRAMUSCULAR | Status: DC | PRN
  Administered 2024-05-10: 12.5 ug via INTRAVENOUS

## 2024-05-10 MED ORDER — FENTANYL CITRATE (PF) 100 MCG/2ML IJ SOLN
INTRAMUSCULAR | Status: AC
  Filled 2024-05-10: qty 2

## 2024-05-10 MED ORDER — LABETALOL HCL 5 MG/ML IV SOLN: 10.0000 mg | INTRAVENOUS | Status: DC | PRN

## 2024-05-10 MED ORDER — ASPIRIN 81 MG PO CHEW
81.0000 mg | CHEWABLE_TABLET | ORAL | Status: AC
  Administered 2024-05-10: 81 mg via ORAL

## 2024-05-10 MED ORDER — SODIUM CHLORIDE 0.9% FLUSH: 3.0000 mL | Freq: Two times a day (BID) | INTRAVENOUS | Status: DC

## 2024-05-10 MED ORDER — ACETAMINOPHEN 325 MG PO TABS: 650.0000 mg | ORAL_TABLET | ORAL | Status: DC | PRN

## 2024-05-10 MED ORDER — MIDAZOLAM HCL 2 MG/2ML IJ SOLN
INTRAMUSCULAR | Status: AC
  Filled 2024-05-10: qty 2

## 2024-05-10 MED ORDER — MIDAZOLAM HCL (PF) 2 MG/2ML IJ SOLN
INTRAMUSCULAR | Status: DC | PRN
  Administered 2024-05-10: .5 mg via INTRAVENOUS

## 2024-05-10 MED ORDER — SODIUM CHLORIDE 0.9 % IV SOLN: 250.0000 mL | INTRAVENOUS | Status: DC | PRN

## 2024-05-10 MED ORDER — HYDRALAZINE HCL 20 MG/ML IJ SOLN: 10.0000 mg | INTRAMUSCULAR | Status: DC | PRN

## 2024-05-10 MED ORDER — LIDOCAINE HCL (PF) 1 % IJ SOLN
INTRAMUSCULAR | Status: DC | PRN
  Administered 2024-05-10 (×2): 12 mL

## 2024-05-10 MED ORDER — HEPARIN (PORCINE) IN NACL 2000-0.9 UNIT/L-% IV SOLN
INTRAVENOUS | Status: DC | PRN
  Administered 2024-05-10: 1000 mL

## 2024-05-10 MED ORDER — ONDANSETRON HCL 4 MG/2ML IJ SOLN: 4.0000 mg | Freq: Four times a day (QID) | INTRAMUSCULAR | Status: DC | PRN

## 2024-05-10 MED ORDER — HEPARIN SODIUM (PORCINE) 1000 UNIT/ML IJ SOLN
INTRAMUSCULAR | Status: AC
  Filled 2024-05-10: qty 10

## 2024-05-10 NOTE — Interval H&P Note (Signed)
 History and Physical Interval Note:  05/10/2024 8:44 AM  [name]  has presented today for surgery, with the diagnosis of aortic stenosis.  The various methods of treatment have been discussed with the patient and family. After consideration of risks, benefits and other options for treatment, the patient has consented to  Procedures: LEFT HEART CATH AND CORONARY ANGIOGRAPHY ([name]) as a surgical intervention.  The patient's history has been reviewed, patient examined, no change in status, stable for surgery.  I have reviewed the patient's chart and labs.  Questions were answered to the patient's satisfaction.    Cath Lab Visit (complete for each Cath Lab visit)  Clinical Evaluation Leading to the Procedure:   ACS: No.  Non-ACS:    Anginal Classification: CCS I  Anti-ischemic medical therapy: Maximal Therapy (2 or more classes of medications)  Non-Invasive Test Results: No non-invasive testing performed  Prior CABG: No previous CABG  [name]

## 2024-05-10 NOTE — Progress Notes (Signed)
 Site area: left femoral Site Prior to Removal:  Level 0  Pressure Applied For: 25 minutes Manual:   yes Patient Status During Pull: stable, BP elevated labetalol  given   Post Pull Site:  Level 0 Post Pull Instructions Given: yes  Post Pull Pulses Present: yes Dressing Applied:  yes CDI Bedrest begins @ 1100 Comments:

## 2024-05-10 NOTE — Discharge Instructions (Addendum)
 Femoral Site Care The following information offers guidance on how to care for yourself after your procedure. Your health care provider may also give you more specific instructions. If you have problems or questions, contact your health care provider. What can I expect after the procedure? After the procedure, it is common to have bruising and tenderness at the incision site. This usually fades within 1-2 weeks. Follow these instructions at home: Incision site care  Follow instructions from your health care provider about how to take care of your incision site. Make sure you: Wash your hands with soap and water for at least 20 seconds before and after you change your bandage (dressing). If soap and water are not available, use hand sanitizer. Remove your dressing in 24 hours. Leave stitches (sutures), skin glue, or adhesive strips in place. These skin closures may need to stay in place for 2 weeks or longer. If adhesive strip edges start to loosen and curl up, you may trim the loose edges. Do not remove adhesive strips completely unless your health care provider tells you to do that. Do not take baths, swim, or use a hot tub for at least 1 week. You may shower 24 hours after the procedure or as told by your health care provider. Gently wash the incision site with plain soap and water. Pat the area dry with a clean towel. Do not rub the site. This may cause bleeding. Do not apply powder or lotion to the site. Keep the site clean and dry. Check your femoral site every day for signs of infection. Check for: Redness, swelling, or pain. Fluid or blood. Warmth. Pus or a bad smell. Activity If you were given a sedative during the procedure, it can affect you for several hours. Do not drive or operate machinery until your health care provider says that it is safe. Rest as told by your health care provider. Avoid sitting for a long time without moving. Get up to take short walks every 1-2 hours. This  is important to improve blood flow and breathing. Ask for help if you feel weak or unsteady. Return to your normal activities as told by your health care provider. Ask your health care provider what activities are safe for you and when you can return to work. Avoid activities that take a lot of effort for the first 2-3 days after your procedure, or as long as directed. Do not lift anything that is heavier than 10 lb (4.5 kg), or the limit that you are told, until your health care provider says that it is safe. General instructions Take over-the-counter and prescription medicines only as told by your health care provider. If you will be going home right after the procedure, plan to have a responsible adult care for you for the time you are told. This is important. Keep all follow-up visits. This is important. Contact a health care provider if: You have a fever or chills. You have any of these signs of infection at your incision site: Redness, swelling, or pain. Fluid or blood. Warmth. Pus or a bad smell. Get help right away if: The incision area swells very fast. The incision area is bleeding, and the bleeding does not stop when you hold steady pressure on the area. Your leg or foot becomes pale, cool, tingly, or numb. These symptoms may represent a serious problem that is an emergency. Do not wait to see if the symptoms will go away. Get medical help right away. Call your local emergency  services (911 in the [place] Do not drive yourself to the hospital. Summary After the procedure, it is common to have bruising and tenderness that fade within 1-2 weeks. Check your femoral site every day for signs of infection. Do not lift anything that is heavier than 10 lb (4.5 kg), or the limit that you are told, until your health care provider says that it is safe. Get help right away if the incision area swells very fast, you have bleeding at the incision area that does not stop, or your leg or foot  becomes pale, cool, or numb. This information is not intended to replace advice given to you by your health care provider. Make sure you discuss any questions you have with your health care provider. Document Revised: 12/13/2020 Document Reviewed: 05/14/2020 Elsevier Patient Education  2024 [employer].

## 2024-05-10 NOTE — Progress Notes (Signed)
 Discharge instructions reviewed with patient and spouse at bedside. Denies questions or concerns. PT tolerated PO intake. Ambulated in the hallway, was able to void without difficulty. Incision site remains clean dry and intact. No s/s of complications. PT escorted from the unit via wheel chair to personal vehicle.

## 2024-05-11 ENCOUNTER — Other Ambulatory Visit: Payer: Self-pay

## 2024-05-11 ENCOUNTER — Encounter (HOSPITAL_COMMUNITY): Payer: Self-pay | Admitting: Internal Medicine

## 2024-05-11 DIAGNOSIS — I350 Nonrheumatic aortic (valve) stenosis: Secondary | ICD-10-CM

## 2024-05-14 ENCOUNTER — Other Ambulatory Visit (HOSPITAL_COMMUNITY)

## 2024-05-14 ENCOUNTER — Encounter (HOSPITAL_COMMUNITY): Payer: Self-pay

## 2024-05-26 ENCOUNTER — Ambulatory Visit (HOSPITAL_COMMUNITY)

## 2024-06-02 ENCOUNTER — Encounter: Admitting: Surgery

## 2024-08-13 ENCOUNTER — Ambulatory Visit (HOSPITAL_COMMUNITY)
